# Patient Record
Sex: Female | Born: 1935 | Race: Black or African American | Hispanic: No | Smoking: Former smoker
Health system: Southern US, Community
[De-identification: ages and names within clinical notes are randomized; demographics above are authoritative.]

## PROBLEM LIST (undated history)

## (undated) DIAGNOSIS — M109 Gout, unspecified: Secondary | ICD-10-CM

## (undated) DIAGNOSIS — K8020 Calculus of gallbladder without cholecystitis without obstruction: Secondary | ICD-10-CM

## (undated) DIAGNOSIS — I2510 Atherosclerotic heart disease of native coronary artery without angina pectoris: Secondary | ICD-10-CM

## (undated) DIAGNOSIS — Z87898 Personal history of other specified conditions: Secondary | ICD-10-CM

## (undated) DIAGNOSIS — I5032 Chronic diastolic (congestive) heart failure: Secondary | ICD-10-CM

## (undated) DIAGNOSIS — N183 Chronic kidney disease, stage 3 unspecified: Secondary | ICD-10-CM

## (undated) DIAGNOSIS — K2970 Gastritis, unspecified, without bleeding: Secondary | ICD-10-CM

## (undated) DIAGNOSIS — Z940 Kidney transplant status: Secondary | ICD-10-CM

## (undated) DIAGNOSIS — J189 Pneumonia, unspecified organism: Secondary | ICD-10-CM

## (undated) DIAGNOSIS — E119 Type 2 diabetes mellitus without complications: Secondary | ICD-10-CM

## (undated) DIAGNOSIS — B3781 Candidal esophagitis: Secondary | ICD-10-CM

## (undated) DIAGNOSIS — I119 Hypertensive heart disease without heart failure: Secondary | ICD-10-CM

## (undated) DIAGNOSIS — R0600 Dyspnea, unspecified: Secondary | ICD-10-CM

## (undated) HISTORY — PX: ABDOMINAL HYSTERECTOMY: SHX81

## (undated) HISTORY — DX: Calculus of gallbladder without cholecystitis without obstruction: K80.20

## (undated) HISTORY — DX: Gout, unspecified: M10.9

---

## 1997-11-26 ENCOUNTER — Other Ambulatory Visit: Admission: RE | Admit: 1997-11-26 | Discharge: 1997-11-26 | Payer: Self-pay | Admitting: *Deleted

## 1997-12-03 ENCOUNTER — Other Ambulatory Visit: Admission: RE | Admit: 1997-12-03 | Discharge: 1997-12-03 | Payer: Self-pay | Admitting: Nephrology

## 1997-12-10 ENCOUNTER — Other Ambulatory Visit: Admission: RE | Admit: 1997-12-10 | Discharge: 1997-12-10 | Payer: Self-pay | Admitting: Nephrology

## 1997-12-21 ENCOUNTER — Ambulatory Visit: Admission: RE | Admit: 1997-12-21 | Discharge: 1997-12-21 | Payer: Self-pay | Admitting: Physician Assistant

## 1997-12-21 ENCOUNTER — Other Ambulatory Visit: Admission: RE | Admit: 1997-12-21 | Discharge: 1997-12-21 | Payer: Self-pay | Admitting: Nephrology

## 1998-01-16 ENCOUNTER — Ambulatory Visit (HOSPITAL_COMMUNITY): Admission: RE | Admit: 1998-01-16 | Discharge: 1998-01-16 | Payer: Self-pay | Admitting: Vascular Surgery

## 1998-01-16 ENCOUNTER — Other Ambulatory Visit: Admission: RE | Admit: 1998-01-16 | Discharge: 1998-01-16 | Payer: Self-pay | Admitting: *Deleted

## 1998-02-06 ENCOUNTER — Other Ambulatory Visit: Admission: RE | Admit: 1998-02-06 | Discharge: 1998-02-06 | Payer: Self-pay | Admitting: *Deleted

## 1998-02-14 ENCOUNTER — Ambulatory Visit: Admission: RE | Admit: 1998-02-14 | Discharge: 1998-02-14 | Payer: Self-pay | Admitting: Vascular Surgery

## 1998-02-15 ENCOUNTER — Other Ambulatory Visit: Admission: RE | Admit: 1998-02-15 | Discharge: 1998-02-15 | Payer: Self-pay | Admitting: Nephrology

## 1998-03-19 ENCOUNTER — Ambulatory Visit (HOSPITAL_COMMUNITY): Admission: RE | Admit: 1998-03-19 | Discharge: 1998-03-19 | Payer: Self-pay | Admitting: Vascular Surgery

## 1998-04-10 ENCOUNTER — Ambulatory Visit (HOSPITAL_COMMUNITY): Admission: RE | Admit: 1998-04-10 | Discharge: 1998-04-10 | Payer: Self-pay | Admitting: Vascular Surgery

## 1998-04-13 ENCOUNTER — Ambulatory Visit (HOSPITAL_COMMUNITY): Admission: EM | Admit: 1998-04-13 | Discharge: 1998-04-13 | Payer: Self-pay | Admitting: Emergency Medicine

## 1998-04-29 ENCOUNTER — Ambulatory Visit (HOSPITAL_COMMUNITY): Admission: RE | Admit: 1998-04-29 | Discharge: 1998-04-29 | Payer: Self-pay | Admitting: *Deleted

## 1998-05-12 ENCOUNTER — Ambulatory Visit (HOSPITAL_COMMUNITY): Admission: RE | Admit: 1998-05-12 | Discharge: 1998-05-12 | Payer: Self-pay | Admitting: Thoracic Surgery

## 1998-05-22 ENCOUNTER — Encounter (HOSPITAL_COMMUNITY): Admission: RE | Admit: 1998-05-22 | Discharge: 1998-08-20 | Payer: Self-pay | Admitting: Nephrology

## 1998-05-27 ENCOUNTER — Ambulatory Visit (HOSPITAL_COMMUNITY): Admission: RE | Admit: 1998-05-27 | Discharge: 1998-05-27 | Payer: Self-pay | Admitting: *Deleted

## 1998-07-24 ENCOUNTER — Emergency Department (HOSPITAL_COMMUNITY): Admission: EM | Admit: 1998-07-24 | Discharge: 1998-07-24 | Payer: Self-pay | Admitting: Emergency Medicine

## 1998-07-27 ENCOUNTER — Emergency Department (HOSPITAL_COMMUNITY): Admission: EM | Admit: 1998-07-27 | Discharge: 1998-07-27 | Payer: Self-pay | Admitting: Emergency Medicine

## 1998-07-27 ENCOUNTER — Encounter: Payer: Self-pay | Admitting: Emergency Medicine

## 1998-08-25 ENCOUNTER — Ambulatory Visit (HOSPITAL_COMMUNITY): Admission: RE | Admit: 1998-08-25 | Discharge: 1998-08-25 | Payer: Self-pay | Admitting: Vascular Surgery

## 1998-08-28 ENCOUNTER — Ambulatory Visit: Admission: RE | Admit: 1998-08-28 | Discharge: 1998-08-28 | Payer: Self-pay | Admitting: Vascular Surgery

## 1998-09-10 ENCOUNTER — Ambulatory Visit (HOSPITAL_COMMUNITY): Admission: RE | Admit: 1998-09-10 | Discharge: 1998-09-10 | Payer: Self-pay | Admitting: Vascular Surgery

## 1998-09-10 ENCOUNTER — Encounter: Payer: Self-pay | Admitting: Vascular Surgery

## 1998-09-18 ENCOUNTER — Encounter: Payer: Self-pay | Admitting: Nephrology

## 1998-09-18 ENCOUNTER — Ambulatory Visit (HOSPITAL_COMMUNITY): Admission: RE | Admit: 1998-09-18 | Discharge: 1998-09-18 | Payer: Self-pay | Admitting: Nephrology

## 1998-09-22 ENCOUNTER — Ambulatory Visit (HOSPITAL_COMMUNITY): Admission: RE | Admit: 1998-09-22 | Discharge: 1998-09-22 | Payer: Self-pay | Admitting: Vascular Surgery

## 1998-10-17 ENCOUNTER — Ambulatory Visit (HOSPITAL_COMMUNITY): Admission: RE | Admit: 1998-10-17 | Discharge: 1998-10-17 | Payer: Self-pay | Admitting: *Deleted

## 1998-10-20 ENCOUNTER — Ambulatory Visit (HOSPITAL_COMMUNITY): Admission: RE | Admit: 1998-10-20 | Discharge: 1998-10-20 | Payer: Self-pay | Admitting: Vascular Surgery

## 1998-11-03 ENCOUNTER — Ambulatory Visit (HOSPITAL_COMMUNITY): Admission: RE | Admit: 1998-11-03 | Discharge: 1998-11-03 | Payer: Self-pay | Admitting: Nephrology

## 1998-11-04 ENCOUNTER — Encounter: Payer: Self-pay | Admitting: Nephrology

## 1998-11-04 ENCOUNTER — Ambulatory Visit (HOSPITAL_COMMUNITY): Admission: RE | Admit: 1998-11-04 | Discharge: 1998-11-04 | Payer: Self-pay | Admitting: Nephrology

## 1998-11-17 ENCOUNTER — Encounter: Payer: Self-pay | Admitting: Vascular Surgery

## 1998-11-17 ENCOUNTER — Ambulatory Visit (HOSPITAL_COMMUNITY): Admission: EM | Admit: 1998-11-17 | Discharge: 1998-11-17 | Payer: Self-pay | Admitting: Internal Medicine

## 1999-10-21 ENCOUNTER — Ambulatory Visit (HOSPITAL_COMMUNITY): Admission: RE | Admit: 1999-10-21 | Discharge: 1999-10-21 | Payer: Self-pay | Admitting: Nephrology

## 1999-11-04 ENCOUNTER — Encounter (HOSPITAL_COMMUNITY): Admission: RE | Admit: 1999-11-04 | Discharge: 2000-02-02 | Payer: Self-pay | Admitting: Nephrology

## 2000-01-20 ENCOUNTER — Inpatient Hospital Stay (HOSPITAL_COMMUNITY): Admission: AD | Admit: 2000-01-20 | Discharge: 2000-02-01 | Payer: Self-pay | Admitting: Nephrology

## 2000-01-22 ENCOUNTER — Encounter: Payer: Self-pay | Admitting: Nephrology

## 2000-01-26 ENCOUNTER — Encounter: Payer: Self-pay | Admitting: Nephrology

## 2000-01-26 ENCOUNTER — Encounter: Payer: Self-pay | Admitting: Thoracic Surgery

## 2000-02-05 ENCOUNTER — Encounter (HOSPITAL_COMMUNITY): Admission: RE | Admit: 2000-02-05 | Discharge: 2000-05-05 | Payer: Self-pay | Admitting: Nephrology

## 2000-03-15 ENCOUNTER — Ambulatory Visit (HOSPITAL_COMMUNITY): Admission: RE | Admit: 2000-03-15 | Discharge: 2000-03-15 | Payer: Self-pay | Admitting: Nephrology

## 2000-03-15 ENCOUNTER — Encounter: Payer: Self-pay | Admitting: Nephrology

## 2000-07-29 ENCOUNTER — Ambulatory Visit (HOSPITAL_COMMUNITY): Admission: RE | Admit: 2000-07-29 | Discharge: 2000-07-29 | Payer: Self-pay | Admitting: Nephrology

## 2000-09-15 ENCOUNTER — Encounter (HOSPITAL_COMMUNITY): Admission: RE | Admit: 2000-09-15 | Discharge: 2000-12-14 | Payer: Self-pay | Admitting: Nephrology

## 2000-09-29 ENCOUNTER — Encounter: Payer: Self-pay | Admitting: Nephrology

## 2000-09-29 ENCOUNTER — Ambulatory Visit (HOSPITAL_COMMUNITY): Admission: RE | Admit: 2000-09-29 | Discharge: 2000-09-29 | Payer: Self-pay | Admitting: Nephrology

## 2000-11-15 ENCOUNTER — Ambulatory Visit (HOSPITAL_COMMUNITY): Admission: RE | Admit: 2000-11-15 | Discharge: 2000-11-15 | Payer: Self-pay | Admitting: Nephrology

## 2000-11-15 ENCOUNTER — Encounter: Payer: Self-pay | Admitting: Nephrology

## 2000-12-07 ENCOUNTER — Encounter: Payer: Self-pay | Admitting: *Deleted

## 2000-12-08 ENCOUNTER — Encounter: Payer: Self-pay | Admitting: *Deleted

## 2000-12-09 ENCOUNTER — Inpatient Hospital Stay (HOSPITAL_COMMUNITY): Admission: RE | Admit: 2000-12-09 | Discharge: 2000-12-10 | Payer: Self-pay | Admitting: *Deleted

## 2000-12-09 ENCOUNTER — Encounter: Payer: Self-pay | Admitting: *Deleted

## 2000-12-12 ENCOUNTER — Emergency Department (HOSPITAL_COMMUNITY): Admission: EM | Admit: 2000-12-12 | Discharge: 2000-12-13 | Payer: Self-pay | Admitting: Physician Assistant

## 2000-12-14 ENCOUNTER — Inpatient Hospital Stay (HOSPITAL_COMMUNITY): Admission: AD | Admit: 2000-12-14 | Discharge: 2000-12-17 | Payer: Self-pay | Admitting: Nephrology

## 2000-12-14 ENCOUNTER — Encounter: Payer: Self-pay | Admitting: Nephrology

## 2000-12-20 ENCOUNTER — Ambulatory Visit (HOSPITAL_COMMUNITY): Admission: RE | Admit: 2000-12-20 | Discharge: 2000-12-20 | Payer: Self-pay | Admitting: Nephrology

## 2000-12-23 ENCOUNTER — Inpatient Hospital Stay (HOSPITAL_COMMUNITY): Admission: EM | Admit: 2000-12-23 | Discharge: 2000-12-28 | Payer: Self-pay | Admitting: Emergency Medicine

## 2000-12-23 ENCOUNTER — Encounter: Payer: Self-pay | Admitting: Nephrology

## 2000-12-27 ENCOUNTER — Encounter: Payer: Self-pay | Admitting: Nephrology

## 2001-01-19 ENCOUNTER — Ambulatory Visit (HOSPITAL_COMMUNITY): Admission: RE | Admit: 2001-01-19 | Discharge: 2001-01-19 | Payer: Self-pay | Admitting: Nephrology

## 2001-01-19 ENCOUNTER — Encounter: Payer: Self-pay | Admitting: Nephrology

## 2001-02-16 ENCOUNTER — Encounter: Payer: Self-pay | Admitting: *Deleted

## 2001-02-16 ENCOUNTER — Inpatient Hospital Stay (HOSPITAL_COMMUNITY): Admission: RE | Admit: 2001-02-16 | Discharge: 2001-02-24 | Payer: Self-pay | Admitting: *Deleted

## 2001-03-16 ENCOUNTER — Encounter (HOSPITAL_COMMUNITY): Admission: RE | Admit: 2001-03-16 | Discharge: 2001-06-14 | Payer: Self-pay | Admitting: Nephrology

## 2001-04-01 ENCOUNTER — Encounter: Payer: Self-pay | Admitting: Emergency Medicine

## 2001-04-01 ENCOUNTER — Emergency Department (HOSPITAL_COMMUNITY): Admission: EM | Admit: 2001-04-01 | Discharge: 2001-04-01 | Payer: Self-pay | Admitting: Emergency Medicine

## 2001-04-19 ENCOUNTER — Encounter: Payer: Self-pay | Admitting: Nephrology

## 2001-04-19 ENCOUNTER — Inpatient Hospital Stay (HOSPITAL_COMMUNITY): Admission: EM | Admit: 2001-04-19 | Discharge: 2001-04-24 | Payer: Self-pay | Admitting: Emergency Medicine

## 2001-04-19 ENCOUNTER — Encounter (INDEPENDENT_AMBULATORY_CARE_PROVIDER_SITE_OTHER): Payer: Self-pay | Admitting: Specialist

## 2001-07-28 ENCOUNTER — Ambulatory Visit (HOSPITAL_COMMUNITY): Admission: RE | Admit: 2001-07-28 | Discharge: 2001-07-28 | Payer: Self-pay | Admitting: Nephrology

## 2001-07-29 ENCOUNTER — Inpatient Hospital Stay (HOSPITAL_COMMUNITY): Admission: AD | Admit: 2001-07-29 | Discharge: 2001-07-30 | Payer: Self-pay | Admitting: *Deleted

## 2001-07-29 ENCOUNTER — Encounter: Payer: Self-pay | Admitting: *Deleted

## 2001-08-02 HISTORY — PX: KIDNEY TRANSPLANT: SHX239

## 2001-08-28 ENCOUNTER — Encounter: Payer: Self-pay | Admitting: *Deleted

## 2001-08-28 ENCOUNTER — Ambulatory Visit (HOSPITAL_COMMUNITY): Admission: RE | Admit: 2001-08-28 | Discharge: 2001-08-28 | Payer: Self-pay | Admitting: *Deleted

## 2001-08-31 ENCOUNTER — Encounter: Payer: Self-pay | Admitting: Vascular Surgery

## 2001-08-31 ENCOUNTER — Ambulatory Visit (HOSPITAL_COMMUNITY): Admission: RE | Admit: 2001-08-31 | Discharge: 2001-08-31 | Payer: Self-pay | Admitting: Vascular Surgery

## 2001-11-03 ENCOUNTER — Encounter: Payer: Self-pay | Admitting: Vascular Surgery

## 2001-11-03 ENCOUNTER — Inpatient Hospital Stay (HOSPITAL_COMMUNITY): Admission: RE | Admit: 2001-11-03 | Discharge: 2001-11-04 | Payer: Self-pay | Admitting: Vascular Surgery

## 2002-02-08 ENCOUNTER — Ambulatory Visit: Admission: RE | Admit: 2002-02-08 | Discharge: 2002-02-08 | Payer: Self-pay | Admitting: *Deleted

## 2002-02-10 ENCOUNTER — Encounter (INDEPENDENT_AMBULATORY_CARE_PROVIDER_SITE_OTHER): Payer: Self-pay | Admitting: *Deleted

## 2002-02-10 ENCOUNTER — Inpatient Hospital Stay (HOSPITAL_COMMUNITY): Admission: EM | Admit: 2002-02-10 | Discharge: 2002-02-14 | Payer: Self-pay | Admitting: Emergency Medicine

## 2002-04-07 ENCOUNTER — Emergency Department (HOSPITAL_COMMUNITY): Admission: EM | Admit: 2002-04-07 | Discharge: 2002-04-07 | Payer: Self-pay | Admitting: *Deleted

## 2002-04-07 ENCOUNTER — Encounter: Payer: Self-pay | Admitting: Emergency Medicine

## 2002-04-10 ENCOUNTER — Encounter: Payer: Self-pay | Admitting: Nephrology

## 2002-04-10 ENCOUNTER — Encounter: Admission: RE | Admit: 2002-04-10 | Discharge: 2002-04-10 | Payer: Self-pay | Admitting: Nephrology

## 2002-04-10 ENCOUNTER — Inpatient Hospital Stay (HOSPITAL_COMMUNITY): Admission: EM | Admit: 2002-04-10 | Discharge: 2002-04-18 | Payer: Self-pay | Admitting: Emergency Medicine

## 2002-04-13 ENCOUNTER — Encounter: Payer: Self-pay | Admitting: Vascular Surgery

## 2002-10-05 ENCOUNTER — Encounter: Payer: Self-pay | Admitting: Nephrology

## 2002-10-05 ENCOUNTER — Encounter: Admission: RE | Admit: 2002-10-05 | Discharge: 2002-10-05 | Payer: Self-pay | Admitting: Nephrology

## 2002-10-24 ENCOUNTER — Encounter: Admission: RE | Admit: 2002-10-24 | Discharge: 2002-10-24 | Payer: Self-pay | Admitting: Dentistry

## 2002-10-26 ENCOUNTER — Encounter (HOSPITAL_COMMUNITY): Payer: Self-pay | Admitting: Dentistry

## 2002-10-26 ENCOUNTER — Ambulatory Visit (HOSPITAL_COMMUNITY): Admission: RE | Admit: 2002-10-26 | Discharge: 2002-10-26 | Payer: Self-pay | Admitting: Dentistry

## 2003-07-03 ENCOUNTER — Ambulatory Visit (HOSPITAL_COMMUNITY): Admission: RE | Admit: 2003-07-03 | Discharge: 2003-07-03 | Payer: Self-pay | Admitting: Orthopedic Surgery

## 2003-07-03 ENCOUNTER — Ambulatory Visit (HOSPITAL_BASED_OUTPATIENT_CLINIC_OR_DEPARTMENT_OTHER): Admission: RE | Admit: 2003-07-03 | Discharge: 2003-07-03 | Payer: Self-pay | Admitting: Orthopedic Surgery

## 2003-07-03 ENCOUNTER — Encounter (INDEPENDENT_AMBULATORY_CARE_PROVIDER_SITE_OTHER): Payer: Self-pay | Admitting: *Deleted

## 2004-08-09 ENCOUNTER — Emergency Department (HOSPITAL_COMMUNITY): Admission: EM | Admit: 2004-08-09 | Discharge: 2004-08-10 | Payer: Self-pay | Admitting: Emergency Medicine

## 2004-08-11 ENCOUNTER — Ambulatory Visit: Payer: Self-pay | Admitting: Infectious Diseases

## 2004-08-11 ENCOUNTER — Inpatient Hospital Stay (HOSPITAL_COMMUNITY): Admission: AD | Admit: 2004-08-11 | Discharge: 2004-08-16 | Payer: Self-pay | Admitting: Nephrology

## 2004-08-12 ENCOUNTER — Encounter (INDEPENDENT_AMBULATORY_CARE_PROVIDER_SITE_OTHER): Payer: Self-pay | Admitting: Specialist

## 2004-08-13 ENCOUNTER — Encounter (INDEPENDENT_AMBULATORY_CARE_PROVIDER_SITE_OTHER): Payer: Self-pay | Admitting: *Deleted

## 2005-09-08 ENCOUNTER — Emergency Department (HOSPITAL_COMMUNITY): Admission: EM | Admit: 2005-09-08 | Discharge: 2005-09-08 | Payer: Self-pay | Admitting: Emergency Medicine

## 2006-01-15 ENCOUNTER — Emergency Department (HOSPITAL_COMMUNITY): Admission: EM | Admit: 2006-01-15 | Discharge: 2006-01-15 | Payer: Self-pay | Admitting: Emergency Medicine

## 2006-07-09 ENCOUNTER — Emergency Department (HOSPITAL_COMMUNITY): Admission: EM | Admit: 2006-07-09 | Discharge: 2006-07-09 | Payer: Self-pay | Admitting: Emergency Medicine

## 2006-07-18 ENCOUNTER — Emergency Department (HOSPITAL_COMMUNITY): Admission: EM | Admit: 2006-07-18 | Discharge: 2006-07-18 | Payer: Self-pay | Admitting: Emergency Medicine

## 2007-10-17 ENCOUNTER — Encounter: Admission: RE | Admit: 2007-10-17 | Discharge: 2007-10-17 | Payer: Self-pay | Admitting: Nephrology

## 2008-06-20 ENCOUNTER — Ambulatory Visit: Payer: Self-pay | Admitting: Dentistry

## 2010-08-22 ENCOUNTER — Encounter: Payer: Self-pay | Admitting: Nephrology

## 2010-12-18 NOTE — Op Note (Signed)
[place]. [facility]  Patient:    [name], [place] W.                        MRN: [id number] Proc. Date: 12/08/00 Attending:  Denman George, [name]                           Operative Report  PREOPERATIVE DIAGNOSIS:  End-stage renal failure.  POSTOPERATIVE DIAGNOSIS:  End-stage renal failure.  PROCEDURES: 1. Ultrasound localization bilateral internal jugular veins. 2. Attempted bilateral internal jugular vein Ash catheter insertion.  SURGEON:  Denman George, [name]  ASSISTANT:  Nurse.  ANESTHESIA:  Local with MAC.  CLINICAL NOTE:  This is a 77 year old female with a history of multiple previous failed vascular access procedures for hemodialysis.  She recently had an infected right internal jugular Ash catheter removed.  Brought back to the operating room at this time for further access placement with dialysis catheter.  DESCRIPTION OF PROCEDURE:  Prior to prepping, ultrasound localization of the internal jugular vein was carried out bilaterally.  The vein appeared to be small on the left, larger on the right.  There was some thickening of the vein noted bilaterally.  The neck and chest were then prepped and draped sterilely.  Skin and subcutaneous tissue at the base of the right neck was instilled with 1% Xylocaine.  A needle was introduced in the right internal jugular vein. Several attempts made to pass a guidewire; however, these were unsuccessful. There appeared to be an occlusion or stenosis proximally in the vein.  The needle and guidewire were removed.  Several attempts were then made to access the left internal jugular vein. These, however, were unsuccessful.  The procedure was discontinued at this time.  Concern for a possible mediastinal hematoma by fluoroscopy was evident.  Patient transferred to the PACU stable and chest x-ray ordered. DD:  12/09/00 TD:  12/10/00 Job: [id number]

## 2010-12-18 NOTE — Op Note (Signed)
[place]. [facility]  Patient:    [name], [name] Visit Number: [id number] MRN: [id number]          Service Type: SUR Location: [phone] Attending Physician:  Colvin Caroli Dictated by:   Quita Skye Hart [place], [name] Proc. 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  POSTOPERATIVE DIAGNOSIS:  Expanding organized lymphocele surrounding right thigh AV Gore-Tex graft at arterial and venous anastomoses.  OPERATION: 1. Excision of organized lymphocele surrounding AV Gore-Tex graft right    thigh. 2. Thrombectomy and revision AV Gore-Tex graft right thigh.  SURGEON:  Quita Skye. Hart [place], [name]  ASSISTANT:  Nurse.  ANESTHESIA:  General endotracheal anesthesia.  DESCRIPTION OF PROCEDURE:  The patient was taken to the operating room and placed in the supine position at which time satisfactory general endotracheal anesthesia was administered. The right lower quadrant and thigh were prepped with Betadine scrubbing solution, draped in routine sterile manner. There was a large mass overlying the arterial and venous anastomosis measuring approximately 6 cm in diameter.  It was not pulsatile, slightly expansile. The graft was functioning. An oblique incision was made through the previous scar, carried down to this organized mass which appeared to be an organized lymphocele.  Control of the arterial end was obtained where it exited the mass and care was taken to develop the skin flaps surrounding this as thick as possible, being right down on the organized lymphocele. When this was almost completely circumferentially dissected free, it was adherent to the arterial and venous anastomoses quite densely.  Dissection then continued up along the graft. The graft was  actually in the center portion of the lymphocele once it was entered.  Clear fluid was present and this was cultured.  The graft had been previously anastomosed end-to-end in the center of this lymphocele and when applying traction to it while performing the procedure, this anastomosis disrupted.  Therefore, the patient was given 3000 units of heparin and the old anastomosis graft was excised.  There was some thrombus which had formed in the graft and Fogarty thrombectomy was performed of the arterial and venous end.  There was excellent in-flow and good back bleeding. The graft was reanastomosed end-to-end with 6-0 Prolene.  Clamps released and there was good pulse and thrill.  As much of the lymphocele as possible was excised, probably 95% of it, but there was some still remaining up around the arterial and venous anastomoses which was densely adherent to the vessels.  Adequate hemostasis was achieved.  A 10 Jackson-Pratt drain was brought out through an inferiorly based stab wound, secured with nylon suture and the wound was closed with interrupted 3-0 Vicryl and interrupted 3-0 nylon.  Sterile dressing applied.  The patient was taken to the recovery room in satisfactory condition. Dictated by:   Quita Skye Hart [place], [name] Attending Physician:  Colvin Caroli DD:  02/10/02 TD:  02/13/02 Job: [id number]

## 2010-12-18 NOTE — Discharge Summary (Signed)
[place]. [facility]  Patient:    [name]                          MRN: [id number] Adm. Date:  [id number] Disc. Date: 02/01/00 Attending:  Dayle Points Dictator:   Wolfgang Phoenix, P.A. CC:         [facility], [facility]                           Discharge Summary  DISCHARGE DIAGNOSES: 1. Catheter-related sepsis, blood cultures one of two positive for yeast.    Catheter tip positive for Alcaligenes xylosoxidans. 2. Right ileojejunal Ash catheter placement by Dr. Edwyna Shell on January 26, 2000. 3. Hypertension. 4. Hyperkalemia, resolved at discharge.  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  Blood cultures obtained during those treatments revealed fungemia.  One of two blood cultures were positive for yeast.  Catheter in place at the time of admission had been placed in April 2000, and she refused any further graft placement secondary to previous complications with multiple thrombectomies.  On admission, she had a fever of 101.3 with blood pressure 160/83, and three-day history of mild headaches.  She denied any nausea or vomiting.  She was started on IV Diflucan and admitted for further observation and catheter removal.  HOSPITAL COURSE:  [name] was immediately started on Diflucan 400 mg IV q.d., and her catheter was removed on January 20, 2000.  There were no acute complications from this procedure.  On the day following removal of her catheter, she continued to run fevers from 99 to 100 degrees.  The importance of extremity access was again discussed; however, [name] continued to refuse ______ or AVF  placement.  Catheter tips from the old catheter were sent for culture at the time of removal.  Her labs were monitored as well as her fever.  Over the next few days, [name] continued to be euvolemic; however, her potassium began to rise, and finally on June 23, she required p.o. Kayexalate for hyperkalemia.  We were reluctant to place a permanent catheter at this time because she was continuing to spike fevers.  On hospital day #5, she was afebrile with a T-max over the last 24 hours of 99.0 degrees.  She was feeling much better, and it was decided that she could be cleared for catheter placement.  On January 26, 2000, a right IJ Ash catheter was placed by Dr. Edwyna Shell without acute complications.  She was dialyzed following the catheter placement; however, following hemodialysis, she spiked a temperature to 100.8 degrees.  Upon further review, her catheter tips were growing Alcaligenes xylosoxidans, and she was initiated on ciprofloxacin as well as vancomycin.  She had a mild reaction (itching) to vancomycin, and this was discontinued.  No identification of the yeast had been made at that point.  [facility] was notified at that time of the need for sensitivities, and she was changed from Diflucan to amphotericin B until an organism identification could be made.  Follow the initiation of amphotericin B and ciprofloxacin, she defervesced. On hospital day #9, she was afebrile with  temperature of 97.6.  She continued to dialyze via her new catheter without further problems and remained afebrile throughout the remainder of her hospitalization.  On February 01, 2000, she was evaluated by Dr. Camille Bal who felt that she was stable for discharge. It was felt that she should remain on amphotericin B until positive identification of the yeast could be made.  Arrangements were made with her outpatient hemodialysis center to delivery the amphotericin B.  In addition, she will continue on ciprofloxacin  for an additional two weeks for the Alcaligenes xylosoxidans.  DISCHARGE MEDICATIONS: 1. Nephro-Vite 1 p.o. q.d. 2. Calcium 500 mg 1 tablet t.i.d. with meals. 3. Ciprofloxacin 500 mg 1 p.o. q.d. x an additional 8 days. 4. Amphotericin B 35 mg IV each hemodialysis x 10 treatments. 5. Norvasc 10 mg 1 p.o. on non-hemodialysis days, 5 mg on hemodialysis days. 6. Epogen 4000 units IV each hemodialysis.  DISCHARGE INSTRUCTIONS:  [name] is to follow a 70 g protein, 2 g sodium, 2 g potassium, renal failure diet with a 5 cup fluid restriction per day.  Her estimated dry weight at time of discharge is 72.5 kg.  She should follow up her hemodialysis at the [facility] and dialyze via her right IJ Ash catheter.  DISPOSITION:  Discharged to home.  CONDITION:  Stable. DD:  03/07/00 TD:  03/08/00 Job: [id number]

## 2010-12-18 NOTE — H&P (Signed)
[place]. [facility]  Patient:    [name], [name] Visit Number: [id number] MRN: [id number]          Service Type: DSU Location: 5500 [phone] Attending Physician:  Melvenia Needles Dictated by:   Sherrie George, P.A. Admit Date:  12/08/2000   CC:         Denman George, [name] (office)                         History and Physical  DATE OF BIRTH: 05-09-1932  CHIEF COMPLAINT: The patient is a 77 year old black female with end-stage renal disease, on hemodialysis.  She was recently hospitalized in July 2001 for catheter sepsis, and blood cultures (one of two) was positive for yeast, catheter tip positive for Alcaligenes xylosoxidans.  HISTORY OF PRESENT ILLNESS: She was treated medically and had a right ileojejunal Ash catheter placed on January 26, 2000 by Dr. Edwyna Shell.  She presented again earlier this week in the office and had her current Ash catheter removed secondary to reported recurrent sepsis.  I have that documentation of that. She was then sent home, and she is admitted today for outpatient placement of a new Ash catheter.  Dr. Madilyn Fireman attempted this but was unable to get a catheter in.  He plans to admit her for observation overnight and attempt to place a new catheter again tomorrow.  ALLERGIES: The patient had apparent allergic reaction after she was started on ZINACEF intraoperatively.  She developed significant itching all over her body, along with swelling of her eyes and lips.  She was treated with Benadryl and Solu-Cortef and at the time I saw her in the recovery room her itching had pretty much resolved and she had no further swelling of her eyes or lips.  PAST MEDICAL HISTORY:  1. She has end-stage renal disease and has been on hemodialysis for three     years.  She does Monday, Wednesday, and Friday to the [facility]     dialysis unit.  2. She has a history of hypertension, which was reported to be a cause of her     end-stage  renal disease.  3. She had a hysterectomy many years ago at age 65.  SOCIAL HISTORY: She smoked less than a packs of cigarettes every two weeks for three years, and quit over three years ago.  ETOH, none.  FAMILY HISTORY: Noncontributory.  REVIEW OF SYSTEMS: CV: Negative.  No history of stroke, weakness of extremities, trouble with vision or speech difficulties.  PULMONARY: She denies any history of shortness of breath.  She has cough in the morning.  No PND, orthopnea, or night sweats.  CARDIAC: No history of angina, no history of myocardial infarction.  GI: She tolerates a renal diet without difficulty, with no history of diarrhea, constipation, GI bleed, or history of liver problems. GU: She has no complaints.  She does make a little bit of urine early in the a.m. when she first gets up.  EXTREMITIES: Negative.  PHYSICAL EXAMINATION:  GENERAL: The patient is a well-developed, well-nourished black female in no acute distress.  VITAL SIGNS: Temperature 97.4 degrees, pulse 85, respirations 18 and unlabored, blood pressure 160/86.  HEENT: Head normocephalic.  PERRLA.  EOMI.  Fundi not visualized.  Ears, nose, throat, and mouth grossly within normal limits.  NECK: No bruits, no lymphadenopathy, no thyromegaly.  CHEST: Clear to auscultation and percussion bilaterally.  She has sutures in the  right upper chest from her last Ash catheter - this has been removed.  BREAST: Not examined, not indicated.  HEART: No murmurs, rubs, or gallops.  Pulses +1-+2 and equal bilaterally.  ABDOMEN: Soft, nontender, posterior bowel sounds; no palpable organomegaly.  EXTREMITIES: No clubbing, cyanosis, or edema.  SKIN: No rash, no further itching.  IMPRESSION:  1. End-stage renal disease with a yeast/fungal bacteremia.  2. Hypertension.  PLAN: The patient is admitted overnight for 24 hour evaluation, with plans to attempt to place a new Ash catheter in the a.m., Dec 09, 2000. Dictated by:    Sherrie George, P.A. Attending Physician:  Melvenia Needles DD:  12/08/00 TD:  12/08/00 Job: [id number]

## 2010-12-18 NOTE — H&P (Signed)
[place]. [facility]  Patient:    [name], [name]                           MRN: [id number] Adm. Date:  02/16/01 Attending:  _____________________ Dictator:   Durenda Age, P.A.-C.                         History and Physical  DATE OF BIRTH:  12-03-1931  CHIEF COMPLAINT:  ESRD.  HISTORY OF PRESENT ILLNESS:  [age]-year-old black female with known history of ESRD, on hemodialysis, with multiple previous accesses.  The patient was referred by Dr. Aram Beecham B. Eliott Nine for evaluation of A-V Gore-Tex graft in the lower extremities.  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  The patient has been scheduled for right thigh A-V Gore-Tex graft as the best possibility for access on February 16, 2001.  In  the right thigh, the patient has no erythema, edema, purulent drainage, odor or tenderness to palpation.  No fever, chills, mental status changes, shortness of breath, dyspnea on exertion, angina or palpitations.  PAST MEDICAL HISTORY:  ESRD, on hemodialysis on Monday, Wednesday and Friday at [facility].  Hypertension.  Status post catheter sepsis positive for Alcaligenes in May 2002, recurrent from a previous sepsis of the same kind in July 2001.  PAST SURGICAL HISTORY:  Status post hysterectomy, remote.  Status post IJ Ash catheter in June 2001, Dr. Norton Blizzard.  Status post attempted IJ Ash cath in May 2002 by Dr. Denman George, unsuccessful, followed up by status post insertion of a left subclavian Quinton cath.  Status post venogram in June 2002.  MEDICATIONS:  The patient "forgot" to bring the medications.  ALLERGIES:  ZINACEF -- angioedema.  REVIEW OF SYSTEMS:  See HPI and past medical history for significant positives.  Mother died at 63 of kidney disease.  Father died at 60 of MI. One sister alive with a  history of diabetes.  One sister alive with a history of CVA.  One brother alive with a history of diabetes and heart disease and one brother with heart disease.  SOCIAL HISTORY:  Married.  Five children, one on hemodialysis.  She is a retired Administrator, arts delivery person.  She denies any alcohol intake.  She quit tobacco in 1998.  PHYSICAL EXAMINATION:  GENERAL:  Well-developed, well-nourished 77 year old black female in no acute distress, alert and oriented x 3.  VITAL SIGNS:  Blood pressure 122/78, pulse 90, respirations 18.  HEENT:  Normocephalic, atraumatic.  PERRLA.  EOMI.  No cataracts, glaucoma or macular degeneration.  NECK:  Supple.  No JVD, bruits or lymphadenopathy.  CHEST:  Symmetrical on inspiration.  Lungs clear to auscultation.  CARDIOVASCULAR:  Regular rate and rhythm, without murmurs, rubs, or gallops.  ABDOMEN:  Soft, nontender.  Bowel sounds x 4.  No masses or bruits.  GU:  Deferred.  RECTAL:  Deferred.  EXTREMITIES:  No clubbing or cyanosis.  There is a trace of edema in both lower extremities.  No ulcerations.  Warm temperature.  PERIPHERAL PULSES:  Carotids, femorals, popliteals, dorsalis pedis and posterior tibialis 2+ bilaterally.  NEUROLOGIC:  Gait steady.  DTRs 2+ bilaterally.  Muscle strength 5/5.  ASSESSMENT AND PLAN:  End-stage renal disease, for insertion of right thigh arteriovenous Gore-Tex graft at [facility] on February 16, 2001, to be assigned by which physician will perform the procedure. DD:  02/13/01 TD:  02/13/01 Job: [id number]

## 2010-12-18 NOTE — Discharge Summary (Signed)
[place]. [facility]  Patient:    [name], [name] Visit Number: [id number] MRN: [id number]          Service Type: DSU Location: 5500 [phone] Attending Physician:  Alyson Locket Dictated by:   Gwenyth Bender, C.R.N.A. Admit Date:  11/03/2001 Discharge Date: 11/04/2001   CC:         [facility]   Discharge Summary  DATE OF BIRTH:  10-30-1931  ADMISSION DIAGNOSIS:  Lymphocele right thigh.  PAST MEDICAL HISTORY:  End-stage renal disease currently on hemodialysis Monday/Wednesday/Friday schedule at [facility] location.  Hypertension. History of anemia.  History of secondary hyperparathyroidism.  History of partial hysterectomy.  History of coronary artery disease.  ALLERGIES:  SULFA, ANCEF.  DISCHARGE DIAGNOSIS:  Lymphocele x2 of the right thigh and right groin, possibly infected, status post evacuation of lymphocele and placement of new arterial limb of her AV Gore-Tex graft.  BRIEF HISTORY:  [name] is a 77 year old African-American female well-known to [facility].  She has a history of end-stage renal disease and she has had multiple previous surgeries for access.  Most recently in December of 2002, she underwent a simple thrombectomy of her right leg AV Gore-Tex graft by [name], [name]  At that time it was noted that she had extensive lymphoceles on the right groin and it was felt that her graft could not be revised at that time.  On March 19, dialysis note included an area of right thigh access draining plasm.  This was cultured and eventually was returned as positive for MRSA.  The [facility] was consulted and it was determined that she should undergo exploration of this thigh graft.  HOSPITAL COURSE:  On November 03, 2001, [name] was electively admitted to [facility] under the care of [name] for 23-hour admission.  She underwent the following surgical procedure; evacuation of lymphocele x2 of  the right groin and replacement of the arterial limb of her AV Gore-Tex graft.  She tolerated this procedure well and was transferred to unit 5500 after a short stay in the PACU.  She was seen in consultation by the renal service and they arranged for hemodialysis Friday evening, April 4. Dialysis was accomplished without difficulty in the venous limb of her graft. The morning of postoperative day #1, April 5, [name] is feeling well. The JP drains in her thighs were not draining much and they were removed.  Her wounds are satisfactory.  She was evaluated by [name] and it was his opinion that she is ready for discharge home today.  CONDITION ON DISCHARGE:  Improved.  DISCHARGE MEDICATIONS:  She has been given a prescription for Percocet one to two p.o. q.4-6h. p.r.n. for pain. Otherwise she has been instructed to resume her home medications.  ACTIVITY:  Activity as tolerated.  DIET:  She is to continue her renal diet.  WOUND CARE:  She is to change her dressing daily.  She may shower tomorrow, November 05, 2001.  FOLLOW-UP:  She has an appointment to see [name] at the [facility] Office on April 10, the office will call with the exact time for that appointment. Dictated by:   Gwenyth Bender, C.R.N.A. Attending Physician:  Alyson Locket DD:  11/04/01 TD:  11/06/01 Job: [id number]

## 2010-12-18 NOTE — H&P (Signed)
NAME:  [name], [name]                            ACCOUNT NO.:  [id number]   MEDICAL RECORD NO.:  [id number]                   PATIENT TYPE:  INP   LOCATION:  5153                                 FACILITY:  [facility]   PHYSICIAN:  James L. Deterding, [name]            DATE OF BIRTH:  [birth year]   DATE OF ADMISSION:  04/10/2002  DATE OF DISCHARGE:                                HISTORY & PHYSICAL   REASON FOR ADMISSION:  Abdominal pain and possible shingles.   HISTORY OF PRESENT ILLNESS:  The patient is a 77 year-old female who has end-  stage renal disease and hypertension.  She also has a history of coronary  artery disease and is status post a stent.  She has a history of anemia  secondary to hypothyroidism.  She presents now with worsening abdominal  pain.  This abdominal pain started approximately six days ago and she  thought it was gas and originally took some Gas-X for it.  It got worse and  she was seen in the emergency room last week, but we do not know the workup  of that at this time.  It was apparently thought to be benign.  She saw one  of the PA's at dialysis yesterday and a CT scan of her abdomen was ordered  but apparently it was thought not to be too severe at that time also, but it  has been progressive this week.  She has had more nausea and vomiting the  last two to three days, not holding down food or fluids, and when she does  eat it hurts and burns with an aching pain.  She has dry heaves a lot.  She  took some laxatives this weekend with a very good result.  Her bowels are  still working and she is passing gas.  She has this aching, burning pain.  She has no dysuria.  No vaginal discharge.  No blood in her stools.  She has  vomited up no blood.  She has no history of ulcers, diverticulitis or other  abdominal pathology.  The only abdominal operation she has had was a  hysterectomy and that was actually a vaginal hysterectomy.   She felt warm yesterday but no  documented fevers.  She has not had any  abdominal injury.  She is not taking any nonsteroidals at the current time.   REVIEW OF SYMPTOMS:  HEENT:  She denies any visual difficulties.  She denies  sores in her mouth.  She denies difficulty swallowing.  CARDIOVASCULAR:  She  has had no chest pain recently.  She sleeps on two pillows.  No PND.  Notes  some ankle edema worse.  She has a right groin graft.  She denies any  dyspnea on exertion but does not push herself a lot.  GI:  As mentioned  above.  PULMONARY:  She has had no cough or sputum  production. She is a non-  smoker.  She has no wheezing.  SKIN:  She notes some spots on her back that  actually started last week also.  Initially they were much more sore and are  not so sore now.  They itched a lot.  MUSCULOSKELETAL:  She has some various  aches and pains in her legs and hips, but not real severe.  EXTREMITIES:  She was evaluated by CVTS yesterday because she was having extending  lymphocele in the right groin where she has a graft.   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  History of vascular access thrombosis recurrently.  The  lymphocele was resected in the right groin approximately three months ago  but now it has a recurrence.  She had a cardiac cath with a stent in the RCA  in July 2002.  She has a history of anemia.   ALLERGIES:  Questionable allergy to VANCOMYCIN and SULFA and possible NSAID  sensitivity but this is not clear.   SOCIAL HISTORY:  She lives with her grandson.  She is separated from her  husband. No alcohol or tobacco.   FAMILY HISTORY:  Postive for renal disease in a son and a daughter.  She has  a second daughter with hypertension.   MEDICATIONS AT HOME INCLUDE:  1. Norvasc 5 mg h.s.  2. Tums EX two with meals.  3. Pepcid 20 mg a day.  4. Restoril 30 mg h.s.  5. Quinine sulfate one q.8h p.r.n.  6. Lopressor 25 mg b.i.d.  7. Aspirin 81 mg a day.  8. Lortab  one every six to eight hours as needed; she does not take that     very regularly.  9. Phenergan 12.5 mg every six hours as needed.  10.      Sorbitol 70% one to two tablespoons p.r.n.   OBJECTIVE:  GENERAL:  She is mild to moderate distress and very pleasant.  VITAL SIGNS:  Temperature is 98.0, blood pressure 173/79.  HEENT:  Reveals some scarring, questionable laser therapy in the right eye.  Ears are unremarkable.  Pharynx shows a whitish film on the tongue.  She has  shotty posterior cervical adenopathy. There is no thyromegaly in the neck.  LUNGS:  No rales, rhonchi or wheezes.  CARDIOVASCULAR:  There is an S4.  PMI is loudest at the fifth intercostal  space.  There are no heaves or thrills.  There is a grade 2/6 holosystolic  murmur best at the apex.  EXTREMITIES:  Decreased radial pulses bilaterally.  Multiple arm scars from  grafts in the upper arms.  She has decreased dorsalis pedis pulses but they  are present bilaterally.  There is a bruit in her right groin graft.  She  has a left femoral bruit also and a midline abdominal bruit which appears to  be radiating from the right groin.  ABDOMEN:  Positive bowel sounds and it is soft.  She has some tenderness but  really not rebound. She is more tender on the left than the right side of  the abdomen.  RECTAL:  Stool is guaiac negative.  PELVIC EXAM:  A bimanual was done.  She has an absent uterus and really not  tender adnexa.  SKIN:  She has multiple circular lesions on the back in two separate areas  in a dermatomal fashion with an eschar necrotic base.  They are raised and  erythematous.  They are not tender  at this time.   Review of the CT that was done today with contrast reveals some renal  atrophy and an area of density in the left lobe of the liver consistent with  __________  diffuse fatty infiltration and less likely subcapsular hemangioma, a slightly dilated gallbladder suggestive of fasting state.  It  did show the  right inguinal either hematoma or pseudoaneurysm or lymphocele.   ASSESSMENT:  1. Abdominal pain, rule out pancreatitis, rule out gastritis or peptic ulcer     disease.  Must consider shingles with the few lesions outside with the     main nerve branch being internally.  What we are seeing today is most     consistent with that.  She is not dehydrated at this time and there is no     evidence of bowel obstruction or bleeding or acute abdomen.  We do need     to do some evaluation and treat her to control the pain and make sure she     has decent intake.  2. Skin lesions.  This is most consistent with herpes zoster.  3. End-stage renal disease, due for dialysis on Wednesday.  4. Hypertension.  Resume medications.  5. Lymphocele of the right groin.  6. Anemia.  7. Secondary hyperparathyroidism.   PLAN:  1. Amylase, lipase, three way abdomen, check LFTs and white count.  2. Clear liquids p.o. and anti-nausea and pain control.  3. Continue other medications.  4. Hemodialysis planned as above.                                               James L. Deterding, [name]    JLD/MEDQ  D:  04/10/2002  T:  04/11/2002  Job:  [id number]

## 2010-12-18 NOTE — Op Note (Signed)
NAME:  [name], [name]                              ACCOUNT NO.:  [id number]   MEDICAL RECORD NO.:  [id number]                   PATIENT TYPE:   LOCATION:                                       FACILITY:   PHYSICIAN:  Quita Skye. Hart Rochester, [name]               DATE OF BIRTH:   DATE OF PROCEDURE:  DATE OF DISCHARGE:                                 OPERATIVE REPORT   PREOPERATIVE DIAGNOSES:  1. Thrombosed right thigh arteriovenous Gore-Tex graft with end-stage renal     disease.  2. Large recurrent lymphocele, right thigh.   POSTOPERATIVE DIAGNOSES:  1. Thrombosed right thigh arteriovenous Gore-Tex graft with end-stage renal     disease.  2. Large recurrent lymphocele, right thigh.  3. 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   OPERATIONS:  1. Attempted insertion of Diatech catheter in right internal jugular vein,     unsuccessful.  2. Attempted insertion of Diatech catheter in left internal jugular vein,     unsuccessful.  3. Simple thrombectomy of right thigh arteriovenous Gore-Tex graft.  4. Ultrasound visualization of bilateral internal jugular veins.   SURGEON:  Quita Skye. Hart Rochester, [name]   FIRST ASSISTANT:  Claudette TNils Flack, R.N.   ANESTHESIA:  Local.   DESCRIPTION OF PROCEDURE:  The patient was taken to the operating room and  placed in the Trendelenburg position, at which time the upper chest and neck  were prepped and draped with Betadine solution and draped in the routine  sterile manner.  After infiltration with 1% Xylocaine, an attempt was made  to enter the right internal jugular vein using a supraclavicular approach.  I did get venous return, but the guide wire would not pass centrally.  A  contrast injection was made and this revealed what appeared to be total  occlusion of the right internal jugular vein in the chest with no filling of  the superior vena cava.  Some collaterals from the azygos system were  visualized.  Following this, an attempt was made on the left side and a  deep  vein was entered, but again a guide wire would not pass into the central  system.  It was suspected that there was central vein occlusion.  Therefore,  the attempt was aborted to insert a central venous hemodialysis catheter.  The right thigh was then prepped and draped in routine sterile manner with  Betadine scrub and solution.  A short incision was made after infiltration  with 1% Xylocaine at the apex of the loop graft in the right thigh.  The  graft was dissected free with a 15 blade.  No heparin was administered.  A  transfer opening was made in the graft.  The graft was filled with fresh  thrombus.  A 5 Fogarty catheter was passed around the arterial end.  Excellent end flow was reestablished with no stenosis noted.  The Fogarty  was passed around  the venous end and would not traverse the venous  anastomosis, although the venous limb of the graft was easily  thrombectomized and there was excellent back bleeding and heparin and saline  could be flushed under low resistance.  Many other passes were made on both  the venous and arterial sides with no further  findings.  The graft was closed with two 6-0 Prolene sutures.  The clamp was  released and there was good Doppler flow and good pulse in the graft.  The  wound was closed with Vicryl in a subcuticular fashion.  A sterile dressing  was applied.  The patient was taken to the recovery room in satisfactory  condition.                                               Quita Skye Hart Rochester, [name]    JDL/MEDQ  D:  04/13/2002  T:  04/15/2002  Job:  [id number]

## 2010-12-18 NOTE — Op Note (Signed)
NAME:  [name], [name]                            ACCOUNT NO.:  [id number]   MEDICAL RECORD NO.:  [id number]                   PATIENT TYPE:  INP   LOCATION:  5508                                 FACILITY:  [facility]   PHYSICIAN:  Quita Skye. Hart Rochester, [name]               DATE OF BIRTH:  [birth year]   DATE OF PROCEDURE:  04/16/2002  DATE OF DISCHARGE:                                 OPERATIVE REPORT   PREOPERATIVE DIAGNOSIS:  End-stage renal disease.   POSTOPERATIVE DIAGNOSIS:  End-stage renal disease.   PROCEDURE:  Insertion of a left thigh superficial femoral artery to  saphenous vein graft AV Gore-Tex graft (6 mm).   SURGEON:  Quita Skye. Hart Rochester, [name]   ASSISTANT:  Loura Pardon.   ANESTHESIA:  General endotracheal.   DESCRIPTION OF PROCEDURE:  The patient was taken to the operating room and  placed in the supine position at which time satisfactory general  endotracheal was administered.  The left thigh was prepped with Betadine  scrub and solution and draped in the routine sterile manner. A short  longitudinal incision was made just distal to the inguinal crease and  superficial femoral artery was dissected free up to its origin and encircled  with vessel loops.  It had an excellent pulse and was free of disease.  The  saphenous vein was dissected free down to the saphenofemoral junction.  A  loop-shaped tunnel was then created and a 6 mm Gore-Tex graft (40 cm) was  delivered through the tunnel.  Using a small counter incision at the apex of  the loop.  3000 units of heparin were given intravenously.  Superficial  femoral artery was occluded proximally and distally with vessel loops and  opened with a 15 blade, extended with Pott's scissors.  Graft was slightly  spatulated and anastomosed end-to-side with 6-0 Prolene.  The saphenofemoral  junction was then occluded at the common femoral venous level and the  saphenous branch ligated distally.  Longitudinal opening made in the  saphenous  vein with 15 blade and extended with Potts scissors up to the  saphenofemoral junction. A 6 mm Gore-Tex was then spatulated and anastomosed  end-to-side with 6-0 Prolene.  Clamps were released and there was good pulse  and thrill in the graft.  No protamine was given and the wound was irrigated  with saline and closed in layers with Vicryl in subcuticular fashion.  A  sterile dressing applied.  The patient was taken to the recovery room in  satisfactory condition.                                               Quita Skye Hart Rochester, [name]    JDL/MEDQ  D:  04/16/2002  T:  04/16/2002  Job:  [id number]

## 2010-12-18 NOTE — Op Note (Signed)
[place]  Patient:    [name], [name] Visit Number: [id number] MRN: [id number]          Service Type: DSU Location: [facility] 2899 12 Attending Physician:  Caralee Ates Dictated by:   Quita Skye Hart Rochester, [name] Proc. Date: 08/28/01 Admit Date:  08/28/2001 Discharge Date: 08/28/2001                             Operative Report  PREOPERATIVE DIAGNOSIS:  Thrombosed arteriovenous Gore-Tex graft, right thigh.  POSTOPERATIVE DIAGNOSES: 1. Thrombosed arteriovenous Gore-Tex graft, right thigh. 2. Central vein occlusion.  OPERATIONS: 1. Thrombectomy of arteriovenous Gore-Tex graft, right thigh. 2. Attempted insertion of a Diatek catheter in the right internal jugular    vein, unsuccessful due to probable central vein occlusion.  SURGEON:  Quita Skye. Hart Rochester, [name]  FIRST ASSISTANT:  Gina H. Collins, P.A.-C.  ANESTHESIA:  General endotracheal.  DESCRIPTION OF PROCEDURE:  The patient was taken to the operating room and placed in the supine position, at which time satisfactory general endotracheal anesthesia was administered.  The right thigh was prepped with Betadine scrub and solution and draped in a routine sterile manner.  A transverse incision was made at the apex of the loop of the graft.  The graft was dissected free. A transverse opening was made in the graft and the 5 Fogarty catheter easily passed around the arterial end.  Adequate inflow was reestablished.  The catheter was then passed around the venous end, which had some mild narrowing near the venous anastomosis by the Fogarty catheter.  Excellent back bleeding was obtained.  The graft was reclosed with two 6-0 Prolene sutures and there was an excellent pulse and thrill in the graft.  The graft had been previously explored in the inguinal region and felt to be not revisable because of multiple organized lymphocytes.  Adequate hemostasis was achieved.  The wound was closed with Vicryl in a  subcuticular fashion.  A sterile dressing was applied.  Having closed the thigh wound, attention was then turned to the upper chest, which was prepped with Betadine solution and draped in a routine sterile manner.  The patient was placed in the Trendelenburg position and the right internal jugular vein was entered percutaneously.  A guide wire was passed centrally using fluoroscopic guidance, but would not pass into the right atrium.  There appeared to be a duplication of central venous systems with no direct communication to the right atrium via the right superior vena cava.  Some contrast was injected and there was no direct filling of the superior vena cava.  This possibly represented a large collateral.  Because it was a large vein, I was attempting to insert the Diatek catheter in position in this vein.  Therefore after dilating it over the tract of the guide wire, the catheter was passed through a peel-away sheath and positioned in appropriate fashion.  It had excellent back bleeding.  Then while attempting to use the tunneler for the Diatek catheter in a superficial position to bring the catheter out in the anterior chest wall, some abundant venous bleeding occurred possibly by a superficial collateral vein because of the central vein occlusion.  Therefore, the tunneling device was removed and compression applied for 10 minutes and hemostasis achieved.  An attempt at inserting this through the right internal jugular vein was aborted.  Once attempt was made to stick the left internal jugular vein and back  bleeding was obtained, but a guide wire would not pass centrally, suggesting central vein occlusion on the left side as well.  The guide wires were removed.  The wound was closed with Vicryl in a subcuticular fashion.  A sterile dressing was applied.  The patient was taken to the recovery room in satisfactory condition. Dictated by:   Quita Skye Hart Rochester, [name] Attending Physician:  Caralee Ates. DD:  08/28/01 TD:  08/28/01 Job: [id number]

## 2010-12-18 NOTE — Discharge Summary (Signed)
NAME:  [name], [name]                            ACCOUNT NO.:  [id number]   MEDICAL RECORD NO.:  [id number]                   PATIENT TYPE:  INP   LOCATION:  5508                                 FACILITY:  [facility]   PHYSICIAN:  James L. Deterding, [name]            DATE OF BIRTH:  04-Jun-1932   DATE OF ADMISSION:  04/10/2002  DATE OF DISCHARGE:  04/18/2002                                 DISCHARGE SUMMARY   DISCHARGE DIAGNOSES:  1. Abdominal pain secondary to herpes zoster.  2. End-stage renal disease on chronic hemodialysis.  3. Hypertension.  4. Clotted right thigh graft with lymphocele.  5. Iron-deficiency anemia.  6. Secondary hyperparathyroidism.   PROCEDURES:  1. Thrombectomy and revision, right AV Gortex graft, Dr. Hart Rochester, 04/13/02.  2. Placement of new left AV Gortex graft, 04/16/02, Dr. Hart Rochester.  3. Hemodialysis.   CONSULTANTS:  1. Dr. Hart Rochester, CVTS.  2. Dr. Ninetta Lights, infectious disease.   HISTORY OF PRESENT ILLNESS:  [name] is a 77 year old African-American  female who has end-stage renal disease and hypertension. She also has a  history of coronary artery disease and is status post stent placement. She  has a history of anemia and secondary hypothyroidism. She now presents with  worsening abdominal pain which started about six days prior. She thought it  was gas and took some Gas-X. It got worse, and she was seen in the emergency  room last night, but the workup is not known at this time; apparent, it was  benign. The patient saw one of the P.A.s at the dialysis center the day  prior, and a CAT scan of her abdomen was ordered but apparently was not  thought to be so severe at that time but had been progressive this week. The  patient has had a little more nausea and vomiting the past two to three  days, not able to hold down foods or fluids, and when she eats, it hurts and  burns like an aching pain. She has dry heaves a lot. She took some laxatives  this weekend with  good results. Her bowels are still working, and she has  passed some gas. She has had aching and burning pains. She has no dysuria,  no vaginal discharge, no blood in her stools. She has vomited up no blood.  She has no history of ulcers, diverticulitis, or any other abdominal  pathology. The only abdominal operations she had was a hysterectomy which  actually was a vaginal hysterectomy. The remainder of physical exam is  outlined in the admission history and physical. CT done on the day of  admission showed some renal atrophy and area of density in the left lobe of  the liver, consistent with fatty infiltration and less likely subscapular  hemangioma and slightly dilated gallbladder suggestive of fasting state. It  did show the right inguinal either hematoma or pseudoaneurysm or lymphocele.   HOSPITAL COURSE:  The patient was admitted to the renal unit. Labs drawn on  admission showed a white count of 7.6, hemoglobin of 14.1, platelets  232,000, normal differential. PTT/PT within normal limits. Sodium 135,  potassium 4.8, chloride 90, CO2 30, glucose 79, BUN 31, creatinine 10.6,  calcium 9.5. Albumin 3.5. Liver function tests within normal limits.  Phosphorous 5.4. Urinalysis unremarkable. The patient was continued on her  usual medications. She had rash noted on her abdominal area for the last 72  hours that was very tender to touch. Infectious disease was called and  recommended starting patient on Valtrex. IgG and IgM levels were checked.  Both were strongly positive for varicella zoster although the patient has no  recollection of either having chicken pox as a child. She was treated with a  week-long course of Valtrex during her hospitalization and with resolution  of her symptoms. Also complicated her hospitalization was that her right  thigh graft clotted. Has had a known history of a large lymphocele. She was  initially going to radiology for thrombolysis; however, because of the   lymphocyte, they declined and she was taken for thrombectomy on 9/12 by Dr.  Hart Rochester. Both felt that the life of this graft was not going to be long.  Therefore, she had a left thigh graft placed 9/15, and hopefully, this would  last until that time. She had unsuccessful attempt bilaterally for IJ  catheter placements with a history of stenosis and occluded subclavian veins  as well. The patient did note some jitteriness at the end of her  hospitalization. Her Reglan was discontinued, and she had stopped her  Valtrex by that time. We will see if this will relief these symptoms. Having  no further problems, she was able to be discharged home on 9/17.   CONDITION ON DISCHARGE:  Improved.   DISCHARGE MEDICATIONS:  1. Norvasc 5 mg q.h.s.  2. PhosLo two tablets with meals three times a day.  3. Lopressor 25 mg b.i.d.  4. Nephro-Vite one tablet per day.  5. Percocet one to two tablets q.6h. p.r.n. pain.   She was instructed to stop Reglan and see if this helped her jumpiness.   DISCHARGE DIET:  80-g protein, 3-g sodium, 2-g potassium diet, fluids 1200  cc per day.   WOUND CARE:  Place dry guaze in the incision of left groin every morning for  the next four to five days.   FOLLOW UP:  Followup appointment with Dr. Hart Rochester 10/14 at 10:30 a.m.   SPECIAL INSTRUCTIONS:  Dialysis schedule will continue to be Monday,  Wednesday, Friday at [facility]. Estimated dry weight is 78  kg. Epogen dose is 9,000 units IV every dialysis. Calcijex 1.0 mcg IV every  hemodialysis. InFeD is 50 mg IV every week.     Weston Settle, PA                        Llana Aliment. Deterding, [name]    MB/MEDQ  D:  05/08/2002  T:  05/10/2002  Job:  [id number]   cc:   Quita Skye. Hart Rochester, [name]  [address]  [place]  [place] [address]  Fax: [phone]   [facility]

## 2010-12-18 NOTE — Discharge Summary (Signed)
[place]. [facility]  Patient:    [name], [name] Visit Number: [id number] MRN: [id number]          Service Type: MED Location: [phone] Attending Physician:  Garnetta Buddy Dictated by:   Loura Pardon, P.A. Admit Date:  04/19/2001 Discharge Date: 04/24/2001   CC:         [name], [name]  [name], [name]  [name], [name]   Discharge Summary  DATE OF BIRTH:  27-Nov-1931  ATTENDING SURGEON:  Dr. Brayton Layman.  CARDIOLOGIST:  Dr. Susa Griffins.  NEPHROLOGIST:  Dr. Camille [name], Dr. Marcelino [name].  DISCHARGE DIAGNOSES: 1. End-stage renal disease with occlusion of both right brachiocephalic and    left subclavian veins with need for lower extremity arteriovenous Gore-Tex    graft to continue hemodialysis Monday, Wednesday, Friday. 2. Atherosclerotic coronary artery disease with demonstration of unstable    angina pectoris postoperatively. 3. Persistent hypotension postoperatively with unresponsive episode on    postoperative day #3 after placement of right thigh arteriovenous Gore-Tex    graft.  SECONDARY DIAGNOSES: 1. Hypertension. 2. Status post hysterectomy. 3. Status post venogram January 01, 2001.  PROCEDURES: 1. February 16, 2001, right thigh arteriovenous Gore-Tex graft, Dr. Elyn Peers. 2. February 20, 2001, left heart catheterization, Dr. Delrae Rend.  The study    showed an 80% stenosis at the midpoint of the right coronary artery.  There    were calcific 50% tandem lesions in the proximal and mid left anterior    descending coronary artery.  The diagonal had an ostial 40-50% stenosis.    There was no left main coronary artery.  The left anterior descending    coronary artery and the left circumflex have separate ostia. 3. February 22, 2001, percutaneous transluminal coronary angioplasty and stenting    of the right coronary artery, Dr. Susa Griffins.  DISPOSITION:  [name] was discharged from [facility]  on February 24, 2001, after [name] days at [facility].  At the time of discharge her blood pressure was 139/60, her pulse was a sinus rhythm without murmur at 86.  Her mental status was clear.  Her lungs were also clear.  A right thigh graft, which had been placed February 16, 2001, was patent and had a thrill.  She did have some mild tenderness at the surgical site. The incisions, however, were healing nicely.  She had remained afebrile during this hospital stay.  Her stay had originally been planned for one day.  It was prolonged because she had experienced substernal chest pain with left arm numbness in the immediate postoperative period.  Cardiac enzymes were obtained which was negative.  She was given an aspirin and nitroglycerin tablet with relief of the pain and started on a beta blocker.  However, she had been having episodes of hypotension which had hindered efforts at hemodialysis. She was followed by [facility], with whom she had had some previous contact.  It was felt that her hypotension was partly due to intraoperative acute blood loss anemia, and it was planned that she would have a stress test as an outpatient should she be discharged.  However, on postoperative day #2 her blood pressure fell markedly during hemodialysis, and she became unresponsive.  She was successfully resuscitated with fluid challenge and stable blood pressure.  It was decided that she would have a left heart catheterization.  This was done February 20, 2001, with finding of well-preserved  left ventricular function with an ejection fraction of 65%, also with left ventricular hypertrophy and single-vessel obstructive coronary artery disease in the mid right coronary artery.  She was then scheduled for PTCA/stenting of the right coronary artery on February 22, 2001, by Dr. Susa Griffins.  She also received hemodialysis the same day with good blood pressures and, indeed, her blood pressures  for the remainder of her hospital stay from postoperative day #6 to postoperative day #9 were stable and acceptable.  She was restarted on her antihypertensives including Norvasc and Lopressor.  She did go home on the following medications on February 24, 2001.  MEDICATIONS:  1. Nephro-Vite 1 tablet daily.  2. Ferrous sulfate 325 mg 1 tablet three times a day between meals.  3. Calcium 500 mg 3 tablets three times a day with meals.  4. Plavix 75 mg daily for 30 days, then aspirin 81 mg daily.  5. Norvasc 10 mg 1 p.o. q.d.  6. Lopressor 12.5 mg 1 p.o. b.i.d.  7. Quinine sulfate 325 mg b.i.d. as needed.  8. Celexa 20 mg daily.  9. Coumadin as directed. 10. Pepcid 20 mg daily 11. Folic acid 1 mg daily.  DIET:  80 g protein, 2 g sodium, 2 g potassium renal diet.  Limit fluids to five cups per day.  DIALYSIS INSTRUCTIONS:  Hemodialysis will be Monday, Wednesday, Friday. Access will be the right internal jugular Ash catheter.  She does have a newly placed right thigh arteriovenous Gore-Tex graft.  The surgical sites are healing well.  The graft has remained patent with a thrill.  HISTORY OF PRESENT ILLNESS:  [name] is a 77 year old African-American female.  She has a known history of end-stage renal disease.  She is on hemodialysis and has had multiple previous access grafts placed.  The patient was referred by Dr. Camille [name] for evaluation of arteriovenous Gore-Tex graft placed in the lower extremity.  She had an upper extremity venogram June 2002.  The study demonstrated occlusion of both her central veins, both the right brachiocephalic and the left subclavian vein.  She does have excellent pulses in both lower extremities.  The patient is scheduled for a right thigh arteriovenous Gore-Tex graft for placement February 16, 2001, by Dr. Elyn Peers.   HOSPITAL COURSE:  The patient presented to [facility] February 16, 2001.  The right thigh arteriovenous Gore-Tex graft was  placed without complications.  However, in the postoperative period [name] reported substernal chest pain and left upper extremity numbness.  She also demonstrated hypotension in the postoperative period, which was exacerbated by hemodialysis and, in fact, she did become unresponsive on postoperative day #3 in hemodialysis.  She responded to a fluid resuscitation with stabilization of blood pressure from 70 systolic to 120 systolic.  She was followed by [facility] during the postoperative period and offered both left heart catheterization and PTCA/stenting of the right coronary artery.  At the time of discharge her blood pressure was stable.  She was tolerating antihypertensive medications and would have follow-up with Dr. Susa Griffins March 09, 2001, at 10:50 a.m. Dictated by:   Loura Pardon, P.A.  Attending Physician:  Garnetta Buddy DD:  05/15/01 TD:  05/16/01 Job: [id number]

## 2010-12-18 NOTE — Discharge Summary (Signed)
[place]. [facility]  Patient:    [name], [name]                         MRN: [id number] Adm. Date:  [id number] Disc. Date: [id number] Attending:  Caralee Ates Dictator:   Donald Pore, P.A. CC:         [facility]   Discharge Summary  ADMISSION DIAGNOSES: 1. Fever with recent yeast sepsis, Staphylococcus aureus catheterization    site infection. 2. End-stage renal disease on chronic hemodialysis. 3. Hypertension. 4. Depression. 5. Status post transient ischemic attack.  DISCHARGE DIAGNOSES: 1. Methicillin-sensitive Staphylococcus sepsis secondary to hemodialysis    catheter infection. 2. Recent yeast sepsis. 3. End-stage renal disease on chronic hemodialysis. 4. Anemia of chronic disease. 5. Depression.  BRIEF HISTORY AND REASON FOR THE ADMISSION:  [name] is a 29 year old black female with end-stage disease secondary to FSG with multiple catheter infections in the past, most recently yeast sepsis on Dec 05, 2000.  Diflucan course was to be completed on January 02, 2001.  The patient presented to the hemodialysis outpatient with fever of 99.1, chills while on hemodialysis.  Her new hemodialysis catheter was placed on Dec 09, 2000 secondary to yeast sepsis and she has had numerous problems with the catheter with multiple rates and bleeding. She required two sutures on two separate occasions.  The last placed on Dec 22, 2000 by the CVTS.  Her temperature spiked while on dialysis and she received 2 grams of Ancef and 100 mg of Tobramycin the day of admission as well as blood cultures drawn times two.  Her exit site on Dec 19, 2000 grew Ancef sensitive Staphylococcus aureus.  She is admitted now for a catheter removal and further workup of her febrile illness.  She has refused graft placement in the past.  Noted only wanting to use catheters for dialysis access.  PHYSICAL EXAMINATION:  On admission her temperature was 102 by the time  she was released from the kidney center.  Blood pressure was 120/60.  She appeared her stated age and was somewhat distressed with chilling.  HEENT: Neck was supple.  Left subclavian catheter site was oozing blood.  No purulence noted. New sutures were noted to be in place.  Chest was clear to auscultation bilaterally.  Cardiac exam revealed an S1 and S2.  No rub noted.  No murmur noted.  Abdomen: Bowel sounds were positive and nontender.  Extremities revealed left upper extremity edema, mild. No peripheral edema noted.  COURSE IN HOSPITAL:  #1 - Methicillin-sensitive Staphylococcus aureus sepsis.  The patient admitted from the hemodialysis unit with a febrile illness and discharge from her catheter.  She has had failed grafts in both upper extremities, saying that she does not want a leg graft placed.  Her present dialysis catheter was only two weeks old, poorly functioning again and bleeding off and on since placement.  The Ash catheter was removed after CVTS was consulted. Catheter tip was sent for culture and sensitivity and also grew out infection of Staphylococcus aureus.  The patient was placed on IV antibiotic therapy of Tobramycin, Ancef and in addition Diflucan because this was to be completed by January 02, 2001 from previous yeast sepsis.  With hemodialysis access out daily BMETs were drawn and volume was monitored closely in patient.  She was feeling much better 24 hours after catheter was removed.  Volume status was stable and fever was done.  Dr. Darrick Penna placed a hemodialysis catheter on Dec 27, 2000. Potassium checked at that time was 4.5 with creatinine of 0.3.  BUN of 62. Volume status was stable.  Hemodialysis was not initiated until the day of discharge, Dec 28, 2000 four days after the last hemodialysis treatment.  She was deemed fair for discharge by Dr. Caryn Section and Dr. Darrick Penna and will complete her antibiotic therapy and Ancef as an outpatient at the kidney center  and also continue her Diflucan, which was dosed for recent yeast sepsis with last dose of that January 02, 2001.  For the remainder of the hospital stay, the patient was refusing an AV Gore-Tex graft.  Intensified counselling with risks of endocarditis and osteomyelitis explained to her.  DISCHARGE MEDICATIONS:  1. Ancef 2 gm q.hemodialysis for three weeks total therapy.  2. Diflucan 200 mg q.d.  3. Celexa 20 mg q.d.  4. Norvasc 5 mg q.d.  5. Protonix 40 mg q.d.  6. Ecotrin one q.d.  7. Epogen 15,000 units q.hemodialysis.  8. Calcijex 0.5 mcg q.dialysis.  9. Nephro-Vite one q.d. 10. Calcium carbonate 500 mg one a.c. 11. InFeD 100 mg IV q.hemodialysis times five and then 100 mg q.weekly. DD:  03/07/01 TD:  03/09/01 Job: [id number]

## 2010-12-18 NOTE — Consult Note (Signed)
[place]. [facility]  Patient:    [name], [name] Visit Number: [id number] MRN: [id number]          Service Type: DSU Location: [phone] Attending Physician:  Melvenia Needles Dictated by:   Garnetta Buddy, [name] Proc. Date: 07/29/01 Admit Date:  07/29/2001                            Consultation Report  REFERRING PHYSICIAN:  Denman George, [name]  REASON FOR ADMISSION:   The patient was admitted for thrombectomy of right thigh graft.  PAST MEDICAL HISTORY/PAST SURGICAL HISTORY:  Pertinent for: 1. FSGS with end-stage renal disease. 2. Hypertension. 3. Vascular access thrombosis. 4. Catheter infections. 5. Anemia. 6. Secondary hyperparathyroidism. 7. History of partial hysterectomy. 8. Coronary artery disease, status post catheterization and stent to RCA.  MEDICATIONS: 1. Norvasc 10 mg p.o. q.d. 2. Vicodin one to two tablets q.4-6h. p.r.n. 3. Calcium 500 mg three tablets q.a.c. 4. Quinine one b.i.d. 5. Aspirin 81 mg q.d. 6. Amitriptyline 25 mg q.h.s. 7. Temazepam 30 mg q.h.s. 8. Nephro-Vite one q.d. 9. Pepcid 20 mg q.d.  SOCIAL HISTORY:  She lives with her grandson. She worked at ______ [employer]. She is negative for tobacco and alcohol use.  FAMILY HISTORY:  She had a son die who was dialysis dependent with an intracranial bleed. It is pertinent for hypertension.  REVIEW OF SYSTEMS:  Negative unless otherwise stated. Denies any chest pain, shortness of breath. No fevers, chills, or sweats. Denies any nausea, vomiting, diarrhea. She is scheduled for carpal tunnel surgery for paresthesias in her hand.  PHYSICAL EXAMINATION:  GENERAL:  Ill-appearing, African-American female in no obvious distress.  VITAL SIGNS:  Blood pressure 160/90, pulse of 73, temperature afebrile.  HEENT:  Head and eyes:  Normocephalic, atraumatic. Pupils are equal, round, and reactive. Extraocular movements intact. Ears, nose, mouth, throat: External  appearance was normal.  CARDIOVASCULAR:  Faint systolic murmur left sternal edge, sustained displaced apex beat.  RESPIRATORY:  Lung fields are clear to auscultation. Good air entry. No wheezes or rales.  ABDOMEN:  Soft, obese, nontender, nondistended. Positive bowel sounds. No bruits.  NEUROLOGICAL:  Cranial nerves 2 through 12 intact. Reflexes were downward going and symmetric. Sensory deficits noted in the lower extremities.  SKIN:  Warm with a right thigh graft.  LABORATORY DATA:  Sodium 137, potassium 6.1, chloride 96, CO2 23, BUN 110, creatinine 14.6, glucose 73, calcium 7.8. INR 1.3.  ASSESSMENT AND PLAN: 1. 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    Multiple access thromboses. Right femoral graft thrombosis. 2. Hyperkalemia. Status post thrombectomy. We will dialyze on 0K bath for one    hour, then followed by a 2 potassium bath for the remainder of her dialysis    treatment. Dictated by:   Garnetta Buddy, [name] Attending Physician:  Melvenia Needles DD:  07/29/01 TD:  07/30/01 Job: [id number]

## 2010-12-18 NOTE — Discharge Summary (Signed)
NAME:  [name], [name]                            ACCOUNT NO.:  [id number]   MEDICAL RECORD NO.:  [id number]                   PATIENT TYPE:  INP   LOCATION:  5530                                 FACILITY:  [facility]   PHYSICIAN:  Quita Skye. Hart Rochester, [name]               DATE OF BIRTH:  05-Jul-1932   DATE OF ADMISSION:  02/10/2002  DATE OF DISCHARGE:  02/14/2002                                 DISCHARGE SUMMARY   DISCHARGE DIAGNOSES:  Expanding, organized lymphocele surrounding right  thigh arteriovenous Gore-Tex graft with arterial and venous anastomoses.   SECONDARY DIAGNOSIS:  1. End-stage renal disease x40 years with hemodialysis Monday, Wednesday and     Friday at [facility].  2. Hypertension.  3. History of anemia.  4. Secondary hyperparathyroidism.  5. Status post partial hysterectomy.  6. History of atherosclerotic coronary artery disease status post left heart     catheterization with stenting July 2002.  7. Status post placement of multiple access grafts to the upper extremities.  8. Placement of right thigh AV Gore-Tex graft with revision September 2002.  9. Lymphocele of the right groin and right thigh evacuated April 2003.   PROCEDURE PERFORMED:  1. February 10, 2002, excision of organized lymphocele surrounding AV Gore-Tex     graft, right thigh.  2. Thrombectomy and revision of right thigh arteriovenous Gore-Tex graft,     Dr. Quita Skye. Hart Rochester, Careers adviser.   DISCHARGE DISPOSITION:  The patient is ready for discharge on postoperative  day #4.  She has been afebrile in the postoperative period.  She has been  covered with antibiotics postoperatively.  The fluid taken intraoperatively  grew negative cultures.  The drain was kept in place in the right thigh for  four days and then removed on the day of discharge.  She was seen in  consultation this hospitalization by the physicians of the [facility].  She was maintained on hemodialysis on her regular schedule of  Monday, Wednesday, and Friday.  Her pain was controlled well  postoperatively.  She was discharged home on the following medications:   DISCHARGE MEDICATIONS:  1. Vicodin 1-2 tablets p.o. q.4-6h. p.r.n. pain.  2. Norvasc 5 mg daily.  3. Enteric-coated aspirin 81 mg daily.  4. Protonix 40 mg daily.  5. Restoril 30 mg p.o. at bedtime.  6. Nephro-Vite daily.  7. Tums 300 mg two tablets before meals.  8. Quinine sulfate 325 mg p.o. b.i.d. as needed.   BRIEF HISTORY:  The patient is a 77 year old, African American female who is  status post evacuation of lymphocele in the right thigh and right groin  November 03, 2001, by Dr. Gretta Began.  She presents today, July 12, with an  increased mass in the right groin and with increased pain.  It is felt to be  a return of her lymphocele, but it could possibly be an expanding  pseudoaneurysm.  The patient was taken to the operating room emergently for  exploration.   HOSPITAL COURSE:  Hospital course is as described in the discharge  disposition.  The patient was admitted February 10, 2002, to [facility]  with expansion of an organized lymphocele in the right groin and thigh.  She  was taken emergently to the operating room on the possibility that it was an  expanding pseudoaneurysm.  Intraoperatively it was found that she did have a  lymphocele.  The fluid was cultured and it was negative.  Her postoperative  progress lasted four days.  The drainage from the Jackson-Pratt drain placed  intraoperatively had decreased markedly by postoperative day #4 and it was  removed.  She goes home with the medications listed above and with followup  with Dr. Hart Rochester two weeks after discharge.   CONDITION ON DISCHARGE:  Stable and improved.   DISCHARGE DIET:  Renal diet.   DISCHARGE ACTIVITIES:  To walk using a walker as she had before surgery.   FOLLOWUP APPOINTMENTS:  She will return to [facility] for hemodialysis on  Monday, Wednesday and  Friday.     Maple Mirza, PA                      Quita Skye. Hart Rochester, [name]    GM/MEDQ  D:  03/15/2002  T:  03/19/2002  Job:  [id number]   cc:   Quita Skye. Hart Rochester, [name]   [facility]

## 2010-12-18 NOTE — Op Note (Signed)
[place]. [facility]  Patient:    [name], [name]                         MRN: [id number] Proc. Date: 02/26/01 Adm. Date:  [id number] Attending:  Caralee Ates.                           Operative Report  PREOPERATIVE DIAGNOSIS:  End-stage renal disease.  POSTOPERATIVE DIAGNOSIS:  End-stage renal disease.  OPERATION PERFORMED:  Right thigh arteriovenous graft.  SURGEON:  Caralee Ates, [name]  ASSISTANT:  Dominica Severin, P.A.  ESTIMATED BLOOD LOSS:  20 cc.  DRAINS:  None.  SPECIMENS:  None.  COMPLICATIONS:  None.  ANESTHESIA:  General endotracheal.  INDICATIONS FOR PROCEDURE:  The patient is a 77 year old white female with end-stage renal disease who has had multiple lower extremity arteriovenous grafts and fistulas on both sides.  Most recently she has been dialyzed through a right internal jugular Perma-Cath catheter.  Since she has exhausted her opportunities for upper extremity arteriovenous access and in need of long term term hemodialysis access, she was scheduled for a right thigh AV graft. Risks, benefits and alternatives to the procedure were explained in detail to the patient and she agreed to proceed.  DESCRIPTION OF PROCEDURE:  The patient was brought to the operating room and placed on the operating table in supine position.  Following adequate general endotracheal anesthesia, the right groin and lower extremity were prepped and draped in sterile fashion.  A transverse incision was made just inferior to the inguinal crease on the right hand side.  The wound was deepened using the Bovie to control bleeding.  The superficial femoral artery was identified at this level and traced proximally to the distal common femoral artery.  The SFA, profunda and common femoral artery were then encircled with vessel loops. Next, the saphenofemoral junction was identified and the saphenous vein was encircled with a vessel loop as was the common femoral vein  proximally and distally.  Next, a counter incision was made distally at the planned apex  of the loop AV graft.  A curved tunneler was then used to pass a 6 mm PTFE graft in loop fashion.  Next, the patient was systemically heparinized.  Following an adequate 3 minute circulation time, the vessel loops around the vein were tightened proximally and distally to occlude flow.  A longitudinal venotomy was performed.  The end of the graft was then spatulated appropriate size and then sewn into position in end-to-side fashion with a running 6-0 PTFE suture. Following completion of the anastomosis, a clamp was placed on the graft and flow was restored to the femoral vein.  There was no significant bleeding from the anastomotic suture line.  The vessel loops around the SFA, profunda and common femoral artery were tightened to occlude flow.  The longitudinal arteriotomy was begun on the SFA and extended proximally just onto the distal common femoral artery.  The graft was then trimmed to the appropriate length and spatulated.  An end-to-side anastomosis was created with 6-0 PTFE suture. Prior to placement of the anastomosis the native vessels were back-bled and the graft was flushed.  The anastomosis was then completed and the vessel loops were released restoring flow to the right lower extremity and into the graft.  At this point there was an excellent thrill in the graft and no significant bleeding from the anastomotic  suture line.  The wound was then irrigated copiously with warm sterile saline and the wounds were closed in layers of 3-0 Vicryl followed by skin closure of 4-0 Monocryl subcuticular stitch.  Dry sterile dressings were applied.  The patient was then awakened from anesthesia and transferred to the recovery room in stable condition.  The patient tolerated the procedure well.  There were no complications.  All sponge and needle counts were reported as correct. DD:  02/16/01 TD:   02/17/01 Job: [id number]

## 2010-12-18 NOTE — Discharge Summary (Signed)
NAME:  [name], [name]                  ACCOUNT NO.:  [id number]   MEDICAL RECORD NO.:  [id number]          PATIENT TYPE:  INP   LOCATION:  5508                         FACILITY:  [facility]   PHYSICIAN:  Aram Beecham B. Eliott Nine, M.D.DATE OF BIRTH:  [birth year]   DATE OF ADMISSION:  08/11/2004  DATE OF DISCHARGE:  08/16/2004                                 DISCHARGE SUMMARY   ADMISSION DIAGNOSES:  1.  Fever and leukocytosis in renal transplant patient.  2.  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  3.  Hypertension.  4.  Anemia of chronic disease.  5.  Mild hyperparathyroidism.  6.  History of coronary artery disease.   DISCHARGE DIAGNOSES:  1.  Group A Streptococcal bacteriemia.  2.  Escherichia coli and Enterococcal urinary tract infection.  3.  Cadaveric renal transplant with creatinine at discharge 0.2.  4.  Diarrhea, C. difficile negative.  5.  Hypertension.  6.  Hyperparathyroidism.  7.  History of coronary artery disease.  8.  Cholelithiasis without cholecystitis.   HISTORY OF PRESENT ILLNESS:  A 77 year old black female with end-stage renal  disease secondary to FSGS status post cadaveric renal transplant was seen in  the emergency room earlier in the week with nausea and poor p.o. intake.  Admitted from the [facility] office where she presented complaining of  ongoing nausea, poor appetite, and new development of diarrhea and fever  Labs in the ER earlier in the week showed a white count of 20,000,  creatinine of 1.9.  Blood and urine cultures were obtained in the ER, and  she was given Zofran and Phenergan.  She denies chest pain, cough, sputum  production, dysuria, hematuria, pain over her allograft, abdominal pain, leg  pain, or joint pain.   LABORATORY DATA AND OTHER STUDIES:  Labs on admission:  White count 17,200,  hemoglobin 9.9, platelets 169,000.  Sodium 132, potassium 3.6, chloride 103,  CO2 21, glucose 119, BUN 34, creatinine 2.0.  Calcium 9.3, albumin 2.7, LFTs  within normal limits.  Phosphorus 2.9.   HOSPITAL COURSE:  The patient was admitted, placed on IV fluids, IV  vancomycin and Cipro.  Blood cultures were obtained.  She has an abdominal  ultrasound which showed cholelithiasis without cholecystitis.  Common bile  duct measured 3.3 mm.  No focal lesions of liver or spleen, suboptimally  visualized.  Transplanted kidney in right lower quadrant measured 13 x 4.2 x  6.5 cm without hydronephrosis.  Color Dopplers showed blood flow into the  kidney.  Also seen was a right pleural effusion.  Plain chest x-ray showed  patchy bibasilar lung densities consistent with atelectasis and/or  pneumonia.  The patient remained afebrile throughout entire hospitalization.  Blood culture drawn in the emergency room prior to admission grew out Group  A strep.  Urine cultures returned growing Escherichia coli and Enterococcus.  She underwent various antibiotic changes.  Vancomycin and Cipro were  stopped, and she was placed  on clindamycin.  With continued diarrhea, it was  felt that clindamycin was not the best choice, and ultimately she was placed  on Avelox for antibiotic consolidation of which she was to receive 400 mg  daily for two weeks.  Infectious disease were consulted.  They recommended  this antibiotic therapy and also to have surveillance blood cultures at two  weeks and four weeks after completion of antibiotics.   With treatment of infection and IV fluids, her creatinine returned to  baseline at 1.2.  She was feeling well but was still having bouts of  diarrhea.  C. difficile was negative.  She was discharged home to use  Imodium.  Lasix and Norvasc were not restarted at time of discharge because  of ongoing diarrhea.  Her blood pressure was approximately 120/50 to 147/64.  She was instructed to  hold Lasix and Norvasc until her diarrhea had  resolved.   Other workup included a tacrolimus level which was therapeutic at 13.8 and  rechecked again on the same day at 9.4.  Blood cultures drawn this admission  were negative.  Stool cultures were all negative including for Giardia and  Cryptosporidium.  Cytomegalovirus by PCR was negative.  Epstein-Barr virus  was also negative.   A 2-D echocardiogram done showed left ventricular function to be normal with  ejection fraction of 55 to 65%.  There were no left ventricular regional  wall motion abnormalities.  There were no significant valvular findings.   DISCHARGE MEDICATIONS:  1.  Lopressor 100 mg b.i.d.  2.  Prograf 2.5 mg b.i.d.  3.  CellCept 500 mg b.i.d.  4.  Pepcid 20 mg daily.  5.  Enteric-coated aspirin 81 mg daily.  6.  Sensipar 30 mg daily.  7.  Reglan 5 mg b.i.d.  8.  Micardis 80 mg 1 a day.  9.  Lasix 80 mg daily, on hold.  10. Norvasc 10 mg daily, on hold.  11. Ferrous sulfate 325 mg on hold until Avelox is completed.  Then she is      to restart at b.i.d.  12. Surveillance blood cultures two weeks and four weeks post antibiotic      completion.  13. Imodium p.r.n. for diarrhea.      RRK/MEDQ  D:  09/05/2004  T:  09/06/2004  Job:  [id number]

## 2010-12-18 NOTE — H&P (Signed)
NAME:  [name], [name]                  ACCOUNT NO.:  [id number]   MEDICAL RECORD NO.:  [id number]          PATIENT TYPE:  INP   LOCATION:                               FACILITY:  [facility]   PHYSICIAN:  Aram Beecham B. Eliott Nine, M.D.DATE OF BIRTH:  08-20-1931   DATE OF ADMISSION:  08/11/2004  DATE OF DISCHARGE:                                HISTORY & PHYSICAL   REASON FOR ADMISSION:  Gram-positive bacteremia.   HISTORY:  [name] is a 77 year old black female who received a cadaveric  renal transplant in 2003 who was admitted for treatment of positive blood  cultures.  Her pertinent history is as follows:  She was seen in the  emergency department on August 09, 2004 with a complaint of 2 weeks of  nausea, 1 day of fevers as high as 101 and general malaise.  Her laboratory  studies were notable at that time for a white blood cell count of 20,000,  urinalysis was unremarkable.  Blood cultures were obtained and she was sent  home to be followed up in our office with prescriptions for Phenergan and  Reglan.   I saw her in the office on August 10, 2004 and at that time her exam was  notable only for a temperature of 99.4 but was otherwise unremarkable with  clear lungs, nontender abdomen and nontender renal allograft.  She was still  complaining of nausea at that time and also had developed the onset of  diarrhea without abdominal pain, melena or hematochezia.  Additional blood  and urine cultures were obtained and she was sent home with a container to  collect a stool for Clostridium difficile.  No antibiotics were given at  that time.   Laboratory obtained yesterday returned with a persistent elevation in white  blood count at 19,500, a urinalysis now showing 10-15 white blood cells with  moderate bacteria and blood cultures drawn in the emergency room on August 09, 2004 were positive for gram-positive cocci in chains.  She is thus  admitted for IV antibiotics and further evaluation for the  etiology of her  bacteremia.   PAST MEDICAL HISTORY:  1.  History of ESRD secondary to FSGS.  2.  Status post cadaveric renal transplant 2003 with baseline creatinine      1.5.  3.  Mild secondary hyperparathyroidism with mild hypercalcemia.  4.  Hypertension.  5.  History of coronary artery disease with prior stenting of the RCA.  6.  Prior history of TIA.  7.  Remote history of fungal sepsis related to a dialysis catheter.  8.  Remote history of Streptococcus viridans bacteremia 2002.   CURRENT MEDICATIONS:  1.  Iron twice a day.  2.  Lasix 80 mg q.a.m.  3.  Norvasc 10 mg a day.  4.  Prograf 2 mg 2 tabs b.i.d.  5.  CellCept 250 mg 2 tabs b.i.d.  6.  Pepcid 20 mg b.i.d.  7.  Metoprolol 50 mg 2 tabs b.i.d.  8.  Aspirin 81 mg a day.  9.  Micardis 80 mg a day.  10.  Reglan 5 mg b.i.d. started on August 09, 2004.  11. Phenergan 25 mg p.r.n. nausea and vomiting started August 09, 2004.  12. Sensipar 30 mg daily on hold since August 10, 2004 secondary to nausea.   FAMILY HISTORY:  Positive for hypertension.  She has 1 son who died of an  intercerebral hemorrhage.   SOCIAL HISTORY:  The patient lives in [place] with her grandson.  She  does not use alcohol or tobacco.   REVIEW OF SYSTEMS:  Positive for nausea, malaise, diffuse myalgias, watery  diarrhea, nausea.  She denies chest pain, cough, shortness of breath, sputum  production, skin rash, arthritis, arthralgias or urinary symptoms.   PHYSICAL EXAMINATION:  GENERAL:  She is an ill-appearing black female.  VITAL SIGNS:  Blood pressure 134/56, heart rate 102, temperature 99.4,  weight 174.  HEENT:  She is normocephalic.  No evidence for trauma.  There is a white  coating on her tongue.  Posterior pharynx is unremarkable.  NECK:  Supple without adenopathy.  LUNGS:  Grossly clear to auscultation.  CARDIAC:  S1 & S2, no S3.  ABDOMEN:  Nondistended.  Bowel sounds are present.  There is a well-healed  allograft scar in the  right lower quadrant.  The kidney itself is firm and  nontender.  There are no abdominal bruits and no organomegaly.  EXTREMITIES:  Show 1+ edema.   LABORATORY DATA:  From August 10, 2004 from [facility]:  Sodium 134, potassium  3.8, chloride 96, CO2 25, BUN 24, creatinine 1.9 glucose 140, bilirubin 1.4,  alkaline phosphatase 140.  SGOT and SGPT are within normal limits.  WBC  19,500 with 82% polys.  Urinalysis 2+ leukocyte esterase, 2+ occult blood,  10-15 white blood cells and moderate bacteria.  Blood and urine cultures  from [facility] obtained August 10, 2004 are pending.  Blood culture from [facility] obtained August 09, 2004 is growing gram-positive cocci in  chains with identification and sensitivity pending.   IMPRESSION:  A 77 year old black female renal transplant patient with:  1.  Recent fever, leukocytosis and blood cultures positive for gram-positive      cocci.  Her initial urine was benign but follow up urine on the August 11, 2003 suggest that this may be the source.  With nausea and diarrhea,      we also need to exclude other gastrointestinal sources including      gallbladder.  We will cover empirically with vancomycin and Cipro for      urinary and gastrointestinal and to cover the positive blood cultures.  2.  Diarrhea.  Question if this is secondary to her sepsis or if related to      some other pathology.  We will check stool for Clostridium difficile and      start Flagyl orally empirically.  3.  Renal transplant--creatinine is somewhat above her baseline of 1.5.  We      will check a Prograf level in the morning.  We will give her IV fluids      since she has had problems with nausea and vomiting, check a renal      ultrasound to rule out hydronephrosis or perinephric fluid collection      and hold her diuretics.  4.  Hypertension is controlled on meds.        ___________________________________________  [name], [name]   CBD/MEDQ  D:   08/11/2004  T:  08/11/2004  Job:  [id number]

## 2010-12-18 NOTE — Op Note (Signed)
[place]  Patient:    [name], [name]                         MRN: [id number] Proc. Date: 12/09/00 Adm. Date:  [id number] Attending:  Melvenia Needles                           Operative Report  PREOPERATIVE DIAGNOSIS:  End-stage renal failure.  POSTOPERATIVE DIAGNOSIS:  End-stage renal failure.  PROCEDURES: 1. Ultrasound localization, left internal jugular vein. 2. Insertion of left subclavian Quinton catheter.  SURGEON:  Denman George, [name]  ASSISTANT:  Nurse.  ANESTHESIA:  Local with MAC.  ANESTHESIOLOGIST:  Edwin Cap. Zoila Shutter, [name]  CLINICAL NOTE:  This is a 77 year old female end-stage renal failure patient with multiple previous access catheters for hemodialysis.  She underwent an attempt at right internal jugular Ash catheter insertion yesterday unsuccessfully.  Brought back to the operating room at this time for further attempt at access with dialysis catheter placement.  DESCRIPTION OF PROCEDURE:  Patient brought to the operating room in stable condition.  Placed in the supine position.  Left neck and chest prepped and draped in a sterile fashion.  Ultrasound localization of left internal jugular vein carried out.  The vein appeared to be quite small and thickened.  It was compressable.  Skin and subcutaneous tissue at the base of the left neck instilled with 1% Xylocaine.  Attempt was made to access the left internal jugular vein; however, this was unsuccessful.  Skin and subcutaneous tissue in the left chest was then instilled with 1% Xylocaine.  A needle was introduced in the left subclavian vein.  Guidewire passed with difficulty but could not be passed into the superior vena cava. Injection of contrast revealed marked tortuosity and stenosis of the left subclavian vein.  A 0.035 angled glidewire was then advanced into the superior vena cava.  A 5 French sheath was then advanced over the glidewire and  the dilator removed from the sheath.  A hand injection of Omnipaque contrast was then made, and this revealed the glidewire to be in the azygos vein.  The glidewire was then pulled back, a torquing device placed on the glidewire, and this was advanced into the right atrium.  Over the glidewire was passed a straight end-hole catheter and an exchange carried out for a super-stiff guidewire.  The end-hole catheter and sheath were then removed.  A subcutaneous tunnel created and a Quinton catheter placed through the tunnel. The guidewire track was then dilated up to 16 [place] size.  The Quinton catheter placed over the super-stiff guidewire and advanced into the superior vena cava and the guidewire removed.  Catheter flushed with heparin and saline solution and capped with heparin.  The catheter fixed to the skin with 2-0 silk suture.  The insertion site closed with interrupted 3-0 nylon.  Sterile dressings were applied.  Patient transferred to recovery room in stable condition. DD:  12/09/00 TD:  12/10/00 Job: [id number]

## 2010-12-18 NOTE — Discharge Summary (Signed)
[place]  Patient:    [name], [name] Visit Number: [id number] MRN: [id number]          Service Type: DSU Location: 5500 [phone] Attending Physician:  Alyson Locket Dictated by:   Maxwell Marion, RNFA Admit Date:  11/03/2001 Discharge Date: 11/04/2001                             Discharge Summary  DATE OF BIRTH: [birth year].  ADMISSION DIAGNOSIS: Lymphocele right thigh.  PAST MEDICAL HISTORY: End stage renal disease, currently on hemodialysis Monday, Wednes Dictated by:   Maxwell Marion, RNFA Attending Physician:  Alyson Locket DD:  11/04/01 TD:  11/06/01 Job: [id number]

## 2010-12-18 NOTE — Op Note (Signed)
. [facility]  Patient:    NYELLA, ECKELS Visit Number: [id number] MRN: [id number]          Service Type: DSU Location: 5500 [phone] Attending Physician:  Alyson Locket Dictated by:   Larina Earthly, [name] Proc. Date: 11/03/01 Admit Date:  11/03/2001 Discharge Date: 11/04/2001                             Operative Report  PREOPERATIVE DIAGNOSES:  End-stage renal disease with possibly infected large lymphocele in right femoral loop AV Gore-Tex graft.  POSTOPERATIVE DIAGNOSES:  End-stage renal disease, with possible infected large lymphocele in right femoral loop AV Gore-Tex graft.  PROCEDURES: 1. Revision of right femoral loop AV Gore-Tex graft; with replacement of    arterial limb lateral to existing lymphocele and the arterial limb    of the graft. 2. Evacuation of large lymphocele and removal of the prior distant arterial    limb of the right femoral AV Gore-Tex graft.  SURGEON:  Larina Earthly, [name]  ASSISTANT:  Loura Pardon, P.A.  ANESTHESIA:  General endotracheal.  COMPLICATIONS:  None.  To recovery room stable.  DESCRIPTION OF PROCEDURE:  The patient was taken to the operating room where later the right groin and right leg shaved and draped in usual sterile fashion.  An incision was made over the distal-most limb of the AV graft, where there was no evidence of inflammation or lymphocele.  The graft was encircled at this level and was not involved with lymphocele or infection.  A separate incision was then made in the groin, and carried down to isolate the graft -- which had a large lymphocele present around the arterial limb of the graft.  This lymphocele was evacuated and the graft was encircled near the arterial anastomosis.  Separate incision was made over the lateral thigh, and a new tunnel was created on the lateral aspect of the thigh, around the old existing graft away from the evidence of inflammation.  The 6 mm  Stanley-Wall graft was brought through the 6 mm stretch Stanley-Wall graft was brought through the tunnel. It was sewn end-to-end to the old graft near the arterial anastomosis, and then end-to-end to the old graft near the venous limb at the apex of the loop.  Clamps were removed and good flow was noted.  These wounds were then all closed with the 3-0 Vicryl in the subcutaneous and subcuticular tissue.  Next, incision was made over the fluctuant area of the lymphocele.  A very large lymphocele was evacuated.  This had some cloudy fluid present, but no obvious pus.  This was sent for culture and sensitivity.  Lymphocele was removed in its entirety, and the arterial limb of the graft which this was surrounding was removed in its entirety as well.  This lymphocele space was irrigated with antibiotic saline and separate stab incision was made.  The drain was brought away from the new graft and placed into the depth of the wound.  This was secured to the skin with a 3-0 nylon stitch and the incision area over the lymphocele was closed with a 3-0 Vicryl subcutaneous and subcuticular stitch.  Sterile dressing was applied. The patient was taken to the recovery room in stable condition. Dictated by:   Larina Earthly, [name] Attending Physician:  Alyson Locket DD:  11/03/01 TD:  11/04/01 Job: [id number]

## 2010-12-18 NOTE — Op Note (Signed)
NAME:  [name], [name]                            ACCOUNT NO.:  [id number]   MEDICAL RECORD NO.:  [id number]                   PATIENT TYPE:  AMB   LOCATION:  DSC                                  FACILITY:  [facility]   PHYSICIAN:  John L. Rendall III, [name]           DATE OF BIRTH:  1931-08-30   DATE OF PROCEDURE:  07/03/2003  DATE OF DISCHARGE:                                 OPERATIVE REPORT   PREOPERATIVE DIAGNOSIS:  Chronic carpal tunnel syndrome with tenosynovitis,  right wrist.   POSTOPERATIVE DIAGNOSIS:  Chronic carpal tunnel syndrome with tenosynovitis,  right wrist.   OPERATION PERFORMED:  Carpal tunnel release with flexor tenosynovectomy.   SURGEON:  John L. Rendall, [name]   ANESTHESIA:  Forearm Bier block.   PATHOLOGY:  The patient has a flattened erythematous median nerve, one of  the worst I have seen in the last couple of months with significant thinning  of nerve fibers deep beneath the volar carpal ligament.  The tenosynovium is  thickened and scarred from chronic inflammation.   INDICATIONS FOR PROCEDURE:  Chronic carpal tunnel syndrome with positive  nerve conduction velocities in a patient with renal transplant.   JUSTIFICATION FOR OUTPATIENT SETTING:  Inpatient not required.   DESCRIPTION OF PROCEDURE:  Under Bier block anesthesia, the hand was  prepared with DuraPrep and draped as a sterile field.  A thenar crease  incision was made curving ulnarward at the wrist flexion crease and then  radially parallel to the palmaris longus.  Dissection was done with 3.5  power magnification.  The flexor retinaculum was split proximally and  elevator was placed beneath the volar carpal ligament and dissection was  then carried from proximally distally preserving the palmaris longus  insertion on the radial side and splitting the entire length of the volar  carpal ligament.  Care was taken to avoid injury to any nerve branches.  Once the entire ligament had been split, the  skin edge proximally was  elevated and the flexor retinaculum was split an inch proximal to the wrist  flexion crease.  Once this was completed, the median nerve was examined.  It  was flattened and erythematous.  Displaced more to the radial side than  usual.  The tenosynovium was found to be thickened and scarred.  The flexor  tenosynovectomy was carried out of the sublimi and to a lesser extent, the  profundi and the FPL.  Specimen was sent to the lab for evaluation.  Once  this was completed, multiple small vessels were cauterized.  The wrist  flexion crease was reapproximated with 3-0 Vicryl.  Skin was closed with 4-0  nylon.  Sterile dressing and volar plaster splint were applied after  infiltrating the wound with Marcaine 0.25% plain with 4 mg morphine.  The  patient returned to recovery in good condition.  John L. Dorothyann Gibbs, [name]    Renato Gails  D:  07/03/2003  T:  07/03/2003  Job:  [id number]

## 2010-12-18 NOTE — H&P (Signed)
[place]. [facility]  Patient:    [name], [name] Visit Number: [id number] MRN: [id number]          Service Type: MED Location: [id number] Attending Physician:  Garnetta Buddy Dictated by:   Garnetta Buddy, [name] Admit Date:  04/19/2001                           History and Physical  CHIEF COMPLAINT: This is a 77 year old African-American female, with end-stage renal disease, hypertension, and focal segmental glomerulosclerosis, admitted with fever and chills on dialysis.  HISTORY OF PRESENT ILLNESS: She received Ancef and tobramycin.  She has a history of failed AV grafts in her upper extremities secondary to thrombosis and multiple catheter infections including Candida, Alcaligenes, Enterococcus, and most recently MSSA.  She received a right thigh graft, performed on February 26, 2001, by Dr. Elyn Peers and is status post removal of a right internal jugular catheter.  She complains of pain and right-sided thigh swelling after placement of the graft.  PAST MEDICAL HISTORY:  1. FSGS.  2. Hypertension.  3. Vascular access thrombosis.  4. Catheter infections.  5. Anemia.  6. Secondary hyperparathyroidism.  7. History of partial hysterectomy.  8. Coronary artery disease, status post catheterization with stent to     right coronary artery.  MEDICATIONS:  1. Norvasc 10 mg q.d.  2. Nephro-Vite one q.d.  3. Calcium carbonate with meals.  4. Restoril 30 mg q.h.s.  5. Percocet 1-2 q.4h to q.6h p.r.n.  6. Quinine 260 mg q.dialysis p.r.n.  7. Protonix 40 mg q.d.  SOCIAL HISTORY: She lives with a grandson.  She worked at [employer]. Negative for tobacco or alcohol.  FAMILY HISTORY: She had a son die who was hemodialysis dependent with an intracranial bleed.  Family history pertinent for hypertension.  REVIEW OF SYSTEMS: Complains of no chest pain, orthopnea, PND, palpitations, blackout spells, or ankle swelling.  She denies cough, wheeze, shortness  of breath, or hemoptysis.  There is nausea but no vomiting.  No diarrheal stools. She as carpal tunnel syndrome with paresthesias in the hands and is scheduled for surgery.  Otherwise, negative except for feeling generally unwell.  PHYSICAL EXAMINATION:  GENERAL: She is an ill-appearing African-American female in no obvious distress.  She is obese.  VITAL SIGNS: Blood pressure 130/80, pulse 80, temperature 101.5 degrees.  HEENT: Normocephalic, atraumatic.  PERRL.  EOMI.  Ears, nose, mouth, throat, external pharynx normal.  Oropharynx clear.  CARDIOVASCULAR: Faint systolic murmur, left sternal edge, with sustained displaced apex beat.  RESPIRATORY: Lung fields clear to auscultation.  She had good air entry bilaterally.  ABDOMEN: Soft, obese, nontender, nondistended.  Positive bowel sounds.  No bruits.  NEUROLOGIC: Cranial nerves 2-12 intact.  Reflexes downgoing and symmetric.  No sensory deficit.  SKIN: Without any lesions.  She had some warmth and swelling located over the right thigh.  EXTREMITIES: Edema over the thigh with some tenderness and adenopathy in the right groin.  Lower extremities without note.  LABORATORY DATA: Sodium 136, potassium 3.3, chloride 94, CO2 30, BUN 16, creatinine 5.5, glucose 150.  Albumin 3.1, AST 22, ALT 8, alkaline phosphatase 103, calcium 8.8, bilirubin 0.5.  WBC 15.3, hemoglobin 9, hematocrit 27, platelets 268,000.  Chest x-ray, left pleural effusion; otherwise clear.  EKG, normal sinus rhythm.  ASSESSMENT/PLAN:  1. Graft infection.  Obtain blood cultures.  Continue tobramycin and Ancef.     Ultrasound graft.  Consult  CV/TS.  2. End-stage renal disease.  Continue hemodialysis, binders, Epogen, and     Calcijex with dialysis.  3. Anemia.  Continue Epogen 7000 units q.treatment.  4. Secondary hyperparathyroidism.  Continue Calcijex 1.5 mcg q.treatment.  5. Hypertension, appears adequately controlled on Norvasc 10 mg q.d.  6. Coronary  artery disease.  Continue current medications.  Control blood     pressure and anemia and volume. Dictated by:   Garnetta Buddy, [name] Attending Physician:  Garnetta Buddy DD:  04/19/01 TD:  04/20/01 Job: [id number]

## 2010-12-18 NOTE — Cardiovascular Report (Signed)
[place]. [facility]  Patient:    [name], [name]                         MRN: [id number] Proc. Date: 02/22/01 Adm. Date:  [id number] Attending:  Caralee Ates CC:         [name], [name]  Delrae Rend, [name]  CP Lab   Cardiac Catheterization  PROCEDURES:  Retrograde central aortic catheterization, selective right coronary artery angiography pre and post icy nitroglycerin administration, Aggrastat bolus plus infusion, weight-adjusted heparin 3600 units, direct stenting of high-grade mid right coronary artery stenosis.  CARDIOLOGIST:  Richard A. Alanda Amass, [name]  BRIEF HISTORY:  Mrs. Michelin is a very pleasant 77 year old black married mother of four children and six grandchildren.  She has ESRD on chronic hemodialysis with a history of systemic hypertension, remote smoking.  She has had dialysis access problems with thrombosed grafts and venous stenosis in the past and recently underwent Gore-Tex right groin A-V fistula by Dr. Liliane Bade for dialysis access (right thigh Gore-Tex).  Postoperatively, she developed chest pain relieved with nitroglycerin and compatible with ischemia without myocardial infarction.  Diagnostic catheterization was performed through the left femoral artery approach on February 20, 2001, and revealed a high-grade eccentric 85% mid right coronary stenosis beyond the acute marginal branch and otherwise fairly normal artery.  There was double ostia to the LAD which had 50% segmental calcific narrowing in the proximal third past the diagonal, 50 to 60% diagonal narrowing, and another 50% narrowing beyond the second septal perforator at the junction of the proximal and mid third of the LAD with calcification.  There was good TIMI-3 flow.  EF was essentially normal with no gradient across the aortic valve.  It was elected to proceed with percutaneous coronary intervention of her right coronary stenosis.  It was difficulty to tell where the  culprit lesion was, but the right coronary stenosis was clearly more severe.  Informed consent was obtained from the patient and her husband.  DESCRIPTION OF PROCEDURE:  The patient was brought to the sixth floor peripheral lab in a postabsorptive state after 5 mg of Valium p.o. premedication.  The left groin was prepped and draped in the usual manner. Xylocaine 1% was used for local anesthesia, and the patient was given 2 mg of Nubain for sedation>  The right femoral artery was entered with single anterior puncture using an 18 thin-wall needle, and a 6-French short Cordis side-arm sheath was inserted without difficulty.  Selective right coronary angiogram was performed with A Sci-Med 6-French JR4 sidehole guiding catheter.  ROA, LAO, and left lateral projections were utilized to avoid compromising the large right ventricular branch with the lesion beginning several millimeters beyond this.  A Hi-Torque 0.014 inch guidewire was used to traverse the vessel and lesion and freeing the distal vessel.  There was only minimal damping of the sidehole guiding catheter.  Arterial pressures were monitored throughout the procedure, and approximately 155/71 mmHg with the patient in sinus rhythm at a rate of 87. She had been premedicated with aspirin and Plavix and given 3600 units of weight-adjusted heparin in the lab, and Aggrastat bolus plus infusion was begun, monitoring ACT throughout the procedure.  The lesion was stented directly with a 3.0/13 heparin-coated Bx Velocity stent which was positioned just beyond the RV branch.  It was deployed at 10 atmospheres for 45 seconds and redilated at 12 atmospheres for 52 seconds.  The balloon was removed.  Final injections post icy nitroglycerin administration revealed excellent angiographic result with stenosis reduction of 85% to -10%.  There was no compromise of the RV side branch.  There was mild 20% narrowing of the RCA just proximal to the RV  branch, and the remainder of the vessel was normal.  There was TIMI-3 flow.  Dilatation system was removed.  Sidearm sheath was flushed.  The patient was brought to the holding area for ACT measurement and subsequent sheath removal.  We plan to continue her Aggrastat for approximately 12 hours, continue aspirin indefinitely and Plavix for four weeks.  RECOMMENDATIONS:  Continued medical therapy of her left coronary disease.  I think outpatient Cardiolite may be helpful in assessing any hemodynamic significance to these, particularly if there is recurrent angina.  CATHETERIZATION DIAGNOSES: 1. Atherosclerotic heart disease, unstable angina pectoris. 2. High-grade mid right coronary artery stenosis treated with direct    coronary stenting, successful, February 23, 2001, 3.0/13 Bx Velocity    heparin-coated stent. 3. Well preserved left ventricular function, left ventricular hypertrophy. 4. Hypertensive heart disease, left ventricular hypertrophy. 5. End-stage renal disease. 6. Past cigarette abuse. 7. Chronic hemodialysis currently by right subclavian Quinton catheter.    New right thigh atrioventricular Gore-Tex graft implanted February 16, 2001. DD:  02/22/01 TD:  02/22/01 Job: [id number]

## 2010-12-18 NOTE — H&P (Signed)
[place]. [facility]  Patient:    [name], [name]                           MRN: [id number] Adm. Date:  12/16/00 Attending:  Duke Salvia. Eliott Nine, [name] Dictator:   Karsten Ro, P.A. CC:         [facility]   History and Physical  DATE OF BIRTH: 03/07/1932  CHIEF COMPLAINT: [name] is a 77 year old black female with end-stage renal disease, on hemodialysis Monday, Wednesday, and Friday at [facility].  This morning after being put on a dialyzer she experienced slurred speech and numbness that began at her chest and extended down to her feet.  HISTORY OF PRESENT ILLNESS: She felt as if she lost control of her legs and they were "jumping up and down."  The episode lasted approximately 15 minutes, after which she complained of feeling bad all over.  She has had a recent Candida sepsis and continues to take Diflucan.  Her right IJ catheter was removed on Dec 05, 2000 and replaced Friday, Dec 09, 2000 (left IJ).  Replacement of the catheter was scheduled for Dec 08, 2000, but apparently she had an allergic reaction to Ancef given presurgery.  She remained in the hospital from Dec 08, 2000 to Dec 10, 2000.  PAST MEDICAL/SURGICAL HISTORY:  1. End-stage renal disease secondary to FSG (first hemodialysis March 1999).  2. Allergic rhinitis.  3. Status post partial hysterectomy.  4. Hypertension.  5. Anemia.  6. History of yeast sepsis January 20, 2000 to February 01, 2000; Alcaligenes     xylosoxidans.  SOCIAL HISTORY: Former Science writer, now unemployed.  Negative for ETOH, negative for tobacco, negative for illicit drug use.  CURRENT MEDICATIONS:  1. Diflucan 200 mg p.o. q.d.  2. Norvasc 10 mg q.h.s.  3. Coumadin 2 mg p.o. q.d.  4. Restoril 30 mg p.o. q.h.s. p.r.n.  5. Calcium 500 mg three a.c. t.i.d.  6. Pepcid 20 mg one p.o. q.h.s.  ALLERGIES:  1. SULFA.  2. HEPARIN.  REVIEW OF SYSTEMS: Positive for nausea, positive for  poor appetite, positive for weakness.  Negative for shortness of breath, negative for chest pain, negative for headache.  All other systems negative.  PHYSICAL EXAMINATION:  VITAL SIGNS: Temperature 97.4 degrees, blood pressure 149/82, pulse 95.  HEENT: PERRLA.  EOMI.  Head normocephalic, atraumatic.  NECK: No masses, no lymphadenopathy.  CHEST: Sutures in place in right upper chest where catheter removed.  Scars from previous catheters in right upper chest.  Left IJ catheter in place, some ecchymosis and edema in this area.  HEART: Regular rate and rhythm without murmurs, rubs, or gallops.  LUNGS: Clear to auscultation bilaterally after hemodialysis.  ABDOMEN: Soft, nontender.  Bowel sounds present in all four quadrants.  EXTREMITIES: Trace edema of feet and ankles.  NEUROLOGIC: Cranial nerves 2-12 grossly intact.  Strength 4+ in all extremities.  Sensation equal bilaterally.  LABORATORY DATA: Hemoglobin 9.4, down from 10.4 on Nov 30, 2000.  ASSESSMENT/PLAN:  1. Admit to 5502, bed two.  2. Transient ischemic attack versus seizure activity.  MRI of brain and     EEG.  3. Candida sepsis.  Obtain 2D echocardiogram to rule out vegetation.  4. End-stage renal disease.  Monday, Wednesday, and Friday dialysis at     [facility].  Completion of hemodialysis today.  Prescription is 3.75 hours,     2K, DFR 500,  BFR 450, EDW 74 kg, FO 8500 units q.treatment, Calcijex     0.5 mcg q.treatment.  DD:  12/16/00 TD:  12/17/00 Job: [id number]

## 2010-12-18 NOTE — Discharge Summary (Signed)
[place]. [facility]  Patient:    [name], [name]                         MRN: [id number] Adm. Date:  [id number] Disc. Date: [id number] Attending:  Lurlean Nanny Dictator:   Karsten Ro, P.A. CC:         [facility]   Discharge Summary  ADMISSION DIAGNOSES: 1. Transient ischemic attack versus seizure. 2. Candida sepsis. 3. End-stage renal disease.  DISCHARGE DIAGNOSES: 1. Transient ischemic attack. 2. Hypertension. 3. Candida sepsis. 4. End-stage renal disease.  PROCEDURES: 1. Electroencephalogram negative for seizure-like activity. 2. 2D echocardiogram on Dec 14, 2000, with left ventricular systolic function    normal, ejection fraction 55-65%, increased aortic valve thickness.    Procedure was technically difficulty.  No vegetation was identified, but    could not be excluded. 3. Magnetic resonance imaging of the brain revealed minimal atrophy and small    vessel disease.  Negative for acute infarct.  CONSULTATIONS:  Urology, Dr. Kelli Hope.  HISTORY OF PRESENT ILLNESS:  [name] is a 77 year old, African-American female with end-stage renal disease on hemodialysis on Monday, Wednesday and Friday at [facility].  On the morning of Dec 14, 2000, after being put on the hemodialyzer, she experienced slurred speech and numbness that began at her chest and extended down to her legs and feet.  She felt as if she lost control of her legs and they were jumping up and down.  The episode lasted approximately 15 minutes after which she complained of feeling bad all over.  [name] has had a recent Candida sepsis and continues to take Diflucan. Her right IJ catheter was removed on Dec 05, 2000, and replaced Friday, Dec 09, 2000, in her left IJ.  Replacement of catheter was scheduled for May 9, but apparently she had an allergic reaction to antibiotics given presurgery.  She remained in the hospital from Dec 08, 2000 to Dec 10, 2000.  Physical exam was essentially negative.  The neurologic portion revealed her cranial nerves II-XII intact.  Strength was 4+ in all extremities and her sensation was equal bilaterally.  Hemoglobin was 9.4 which was decreased by 1 g from hemoglobin results from Nov 30, 2000.  HOSPITAL COURSE:  #1 - TRANSIENT ISCHEMIC ATTACK:  [name] did not have any further slurred speech or similar symptoms to her episode at admission.  She did complain of having a headache.  Dr. Kelli Hope with neurology consulted.  With reviewing the EEG and MRI of the brain, he did not think she had a seizure and there was no evidence on imaging that she had a stroke.  He recommended starting antiplatelet therapy such as aspirin at 81 or 325 mg. The MRA of the brain rule out any vertebral basilar lesions.  He did note that she had some symptoms of depression and a low-dose antidepressant was started.  #2 - HYPERTENSION:  Hypertension was controlled by hemodialysis as well as Norvasc.  It was noted that she did have some orthostasis during this hospitalization.  Her new dose of Norvasc was to be at 5 mg per day.  #3 - CANDIDA SEPSIS:  Diflucan was to be continued until January 02, 2001.  She did have some left upper extremity edema which was probably from her hemodialysis catheter that was placed previous to this admission.  This was to evaluated when Ms. Mamula returned to hemodialysis by the physician  assistance or medical doctor rounding.  #4 - END-STAGE RENAL DISEASE:  [name] had dialysis once during the hospitalization on May 17, which she tolerated very well.  She was to return outpatient to her regular schedule at [facility] on Monday, Wednesday and Friday.  DISPOSITION:  Discharged to home with family.  DISCHARGE MEDICATIONS:  1. Diflucan 200 mg q.d. until January 02, 2001.  2. Norvasc 5 mg p.o. q.d., prescription given with five refills.  3. Os-Cal 500 mg three q.a.c. t.i.d.   4. Protonix 40 mg one q.h.s., prescription given with three refills.  5. Nephro-Vite one p.o. q.d.  6. Hydroxyzine 25 mg one q.6h. p.r.n. itching.  7. Aspirin 325 mg p.o. q.d.  8. Celexa 20 mg one p.o. q.d., prescription with two refills given.  9. Restoril 30 mg one q.h.s. p.r.n. sleep. 10. Darvocet-N 100 one q.6h. p.r.n. pain. 11. Coumadin 2 mg one p.o. q.d. 12. Quinine sulfate 325 mg one q.h.s. and one every third hemodialysis  ACTIVITY:  As tolerated.  DIET:  Renal diet of 80/2/2.  SPECIAL INSTRUCTIONS:  Hemodialysis instructions with Epogen 85 units with dialysis IV.  Calcijex 0.5 mcg every hemodialysis treatment. DD:  01/07/01 TD:  01/09/01 Job: [id number]

## 2010-12-18 NOTE — Op Note (Signed)
[place]. [facility]  Patient:    [name], [name]                         MRN: [id number] Proc. Date: 12/27/00 Adm. Date:  [id number] Attending:  Lurlean Nanny CC:         [facility]   Operative Report  PROCEDURE:  Zachary George twin cath insertion.  INDICATIONS:  Access for hemodialysis.  The procedure was explained to the patient and both benefits and risks were understood and accepted.  SURGEON:  James L. Deterding, [name]  DESCRIPTION OF PROCEDURE:  The patient was taken to the fluoroscopy suite and placed on the fluoroscopy table in the supine position with a towel roll between her shoulders.  Initially, internal jugular vein was located in the middle 1/3 of the sternocleidomastoid triangle.  It was noted to be a very small blood vessel secondary to multiple old catheters.  Much scar tissue was also noted there.  Subsequently the right side of the neck and right upper chest were prepped with Betadine.  2% Xylocaine local anesthesia was used in the sternocleidomastoid triangle and in the inferolateral fashion approximately 8 x 4 cm.  Using sterile ultrasound guidance, internal jugular vein was cannulated.  A J-tip guide wire advanced over the 18 gauge thin wall needle into the internal jugular vein.  It was noted that there was a severe lateral deviation to the wire, but it was able to get to the superior vena cava.  I could not get beyond that initially.  A 10 French dilator was then placed over the guide wire, advanced into the internal jugular vein, past the deviation into the superior vena cava.  The wire was manipulated into the inferior vena cava.  Subsequently serial dilators were placed over the guide wire starting with 10 [place] and then going through 12, 14, 16, and 18 [place]. This was done with great difficulty and the wire had to be replaced with a stiff Amplatz guide wire.  This due to multiple stenoses found in  the superior vena cava and inominant on the right side.  Subsequent 18 French trocar with tear away sheath was placed over the guide wire into the internal jugular vein, inominant, and superior vena cava under fluoroscopic guidance.  A nonserrated dialysis clamp was placed around the trocar and sheath just outside the skin.  Both lumens of a double lumen Schon twincath were flushed with saline and friction screw and tunneling device was placed on the distal ends.  The trocar was then withdrawn from the sheath.  The sheath was clamped with a dialysis clamp.  The catheter was advanced through the remainder of the sheath into the internal jugular vein, inominant, and superior vena cava with the tip coming to rest at the superior vena cava atrial junction after some manipulation.  Initially, there was severe lateral deviation of the tips and guide wires were placed through the lumens after the tunneling devices were removed and the catheter was advanced so that they were as straight as possible.  There were still several curves in the catheter due to multiple stenoses in the superior vena cava.  The wires were then removed and screw-in tunneling device was placed on the ends.  The tunneling devices were bent in 180 degree octave to facilitate tunnel formation.  Two tunnels were performed initially in the superior direction and then lateral inferior with tips coming out 3 cm inferior  to the clavicle 2 cm apart, approximately 7 cm inferior to the vein entry.  Catheters were clamped, cut off, and friction screw-on conducting devices applied.  The catheters were flushed with saline and then concentrated heparin and heparin locked.  The skin over the vein entry was closed with 3-0 silk and pursestring sutures taken 1 cm from the tunnel exit site with 3-0 silk also.  Hypafix pressure dressing was applied. DD:  12/27/00 TD:  12/27/00 Job: [id number]

## 2010-12-18 NOTE — Discharge Summary (Signed)
[place]. [facility]  Patient:    [name], [name] Visit Number: [id number] MRN: [id number]          Service Type: DSU Location: 5500 [phone] Attending Physician:  Alyson Locket Dictated by:   Loura Pardon, P.A. Admit Date:  11/03/2001 Discharge Date: 11/04/2001                             Discharge Summary  DATE OF BIRTH: 07/09/1932.  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  SECONDARY DIAGNOSES: 1. End stage renal disease. 2. Hypertension. 3. History of anemia. 4. History of secondary parathyroidism. 5. History of partial hysterectomy. 6. History of atherosclerotic coronary artery disease.  PROCEDURES: 1. On November 03, 2001 evacuation of lymphoceles X2. 2. Culture of lymphocele right thigh. 3. Placement of arterial limb of arteriovenous Gore-Tex graft with 6 mm    Gore-Tex conduit and removal of existing arterial limb of the    arteriovenous Gore-Tex  graft. 4. The patient to continue on hemodialysis with a functioning graft in the    right thigh. Her hemodialysis is Monday, Wednesday, and Friday at [facility]. 5. Jackson-Pratt drain was placed intraoperatively.  HISTORY OF PRESENT ILLNESS: [name] is a 77 year old female with end stage renal failure. She was admitted to [facility] on July 29, 2001 for a thrombectomy of the right femoral arteriovenous Gore-Tex graft. This was performed by Dr. Denman George. It was noted at that time that the patient had extensive lymphoceles in the right groin and right thigh.  In the interval, the lymphocele in the right thick thigh has become erythematous, warm, and tender with a small pustule tract leading from it at the distal edge of the lymphocele swelling. She is admitted to [facility] -  November 03, 2001 for drainage  and evacuation of the lymphoceles, culture of the lymphoceles and revision of the arteriovenous Gore-Tex graft with removal of existing arterial limb placement of a new arterial limb.  DISCHARGE DISPOSITION: [name] is ready for discharge on postop day #1. She has been afebrile during the postoperatively period. Her mental status has remained clear. Her incisions are without drainage, swelling, or erythema.  JP drain showed minimal output and was removed on postop day #1. The pain is controlled with oral analgesia. She did receive hemodialysis on Friday, November 03, 2001 at [facility]. Her potassium was 4.6 prior to the surgery. She has been maintained in the postoperative period on IV Zosyn.  DISCHARGE MEDICATIONS: She goes home on her usual medications as well as Percocet 5/325 1-2 tabs po q.4-6h. p.r.n. for pain.  ACTIVITY: She may walk to keep up her strength.  DIET: Renal diet.  WOUND CARE: She may shower beginning Monday, November 06, 2001. She is asked to sponge bathe until then. Dictated by:   Loura Pardon, P.A. Attending Physician:  Alyson Locket DD:  11/03/01 TD:  11/04/01 Job: [id number]

## 2010-12-18 NOTE — Op Note (Signed)
[place]. [facility]  Patient:    [name], [name]                         MRN: [id number] Proc. Date: 01/26/00 Adm. Date:  [id number] Attending:  Toma Copier CC:         Norton Blizzard, [name] x 2                           Operative Report  PREOPERATIVE DIAGNOSIS:  End-stage renal disease.  POSTOPERATIVE DIAGNOSIS:  End-stage renal disease.  OPERATION PERFORMED:  Insertion of right IJ Ash catheter.  SURGEON:  D. Karle Plumber, [name]  FIRST ASSISTANT:  None.  ANESTHESIA:  Local anesthesia with IV sedation.  DESCRIPTION OF PROCEDURE:  After prepping and draping the right neck and shoulder, the area was infiltrated with 1% Xylocaine and an ultrasound directed right IJ puncture was performed.  Prior to prepping and using the ultrasound scanner, the right subclavian, left subclavian, the right IJ, and the left IJ were scanned, and the right IJ was small, but opened.  There was a patent right subclavian and left subclavian, and an occluded left IJ.  We used an ultrasound scan.  After the right IJ had been entered, a stab wound was made around the guidewire, and the guidewire was confirmed by fluoroscopy to be down through the heart into the inferior vena cava.  Then, the area was infiltrated with Xylocaine laterally, stab wound made, and an 8 cm Ash catheter was turned laterally to medially.  Over the Ash catheter, a dilator was then placed under fluoroscopy up to a 16 dilator with a peel-away sheath. The Ash catheter, the dilator, and the guidewire were removed.  The sheath was clamped and through the sheath was passed the Ash catheter without any difficulty to the right atrium.  It was flushed with heparin and the Ash catheter was sutured in place with 3-0 nylon at the medial portion, and also at the exit site, and also with a suture tie.  A dry sterile dressing was applied.  The patient was returned to the recovery room in stable condition. DD:   01/26/00 TD:  01/27/00 Job: [id number]

## 2010-12-18 NOTE — Op Note (Signed)
NAMEKaitrin, [name] NO.:  [id number]   MEDICAL RECORD NO.:  [id number]                   PATIENT TYPE:  OIB   LOCATION:                                       FACILITY:  [facility]   PHYSICIAN:  Charlynne Pander, D.D.S.          DATE OF BIRTH:  03-Oct-1931   DATE OF PROCEDURE:  10/26/2002  DATE OF DISCHARGE:                                 OPERATIVE REPORT   PREOPERATIVE DIAGNOSES:  1. End-stage renal disease.  2. Status post kidney transplant surgery.  3. Acute pulpitis.  4. Chronic apical periodontitis.  5. Accretions.  6. Chronic periodontal disease.  7. Dental caries.   POSTOPERATIVE DIAGNOSES:  1. End-stage renal disease.  2. Status post kidney transplant surgery.  3. Acute pulpitis.  4. Chronic apical periodontitis.  5. Accretions.  6. Chronic periodontal disease.  7. Dental caries.   PROCEDURES:  1. Dental examination.  2. Extraction of tooth numbers 2, 4, 20, 23, 24, 25, 26 and 29.  3. Alveoplasty of the lower left and lower right quadrants.  4. Gross debridement of the remaining dentition.   SURGEON:  Charlynne Pander, D.D.S.   ASSISTANT:  [employer] Event organiser).   ANESTHESIA:  1. Monitored anesthesia care per the anesthesia team.   MEDICATIONS:  1. Clindamycin 600 mg IV prior to invasive dental procedures.  2. Local anesthesia with a total utilization of five Carpule each containing     36 mg of Xylocaine with 0.018 mg of epinephrine.   SPECIMENS:  There were eight teeth which were discarded.   CULTURES:  None.   FLUIDS REPLACED:  600 ml of lactated Ringer's solution.   ESTIMATED BLOOD LOSS:  Less than 50 ml.   COMPLICATIONS:  None.   INDICATIONS FOR PROCEDURE:  The patient has a history of end-stage renal  disease and is status post renal transplant in November 2003.  The patient  was referred for evaluation of her dentition to rule out dental infection  which may affect the patient's systemic health and  previous kidney  transplant surgery.  The patient was examined and treatment planned for  extraction of tooth numbers 2, 4, 20, 23, 24, 25, 26 and 29 with alveoplasty  as indicated along with gross debridement of the remaining dentition.  This  treatment plan was formulated to decrease the risk and complications  associated with dental infection for maximizing the patient's systemic  health and previous kidney transplant surgery as well as to assist in  possible rehabilitation for the patient.   OPERATIVE FINDINGS:  The patient was examined in operating room #5.  The  teeth were identified for extraction.  The patient was noted to be affected  by significant accumulation of accretions, chronic periodontal disease,  apical periodontitis and malocclusion.  The patient also had a history of  acute pulpitis symptoms.   DESCRIPTION OF PROCEDURE:  The patient was  brought to main operating room  #5.  The patient was placed in the supine position on the operating room  table.  Monitored anesthesia care was induced by the anesthesia team.  The  patient was then prepped and draped in the usual manner for a dental  medicine procedure.  The oral cavity was clearly examined with the findings  as noted above.  The patient was then ready for the oral surgical procedure  as follows:   Local anesthesia was administered at the initiation of the procedure with a  total utilization of five Carpule each containing 36 mg of Xylocaine with  0.018 mg of epinephrine.   The maxillary right quadrant was first approached.  Tooth numbers 2 and 4  were subluxated with a series of straight elevators.  Tooth #2 was then  removed with a 53R forceps without complications.  Tooth #4 was then removed  with 150 forceps without complications.  The sockets were then curetted and  compressed appropriately.  It was determined that alveoplasty would not need  to be performed at this time.  The surgical site was then irrigated  with  copious amounts of sterile saline.  The extraction site #2 was then closed  utilizing a figure-of-eight suture technique and a 3-0 chromic and suture  material.  No suture was used to close the surgical site #4.   The mandibular left quadrant was then approached. A 15 blade incision was  made from the distal of #19 to the mesial of #20.  The surgical flap was  then reflected.  Tooth #20 was then subluxated with a series of straight  elevators and then removed with the 151 forceps without complications.  Alveoplasty was then performed utilizing rongeurs and bone file.  The  tissues were then approximated and trimmed appropriately.  The surgical site  was then irrigated with copious amounts of sterile saline.  The surgical  site was then closed utilizing 3-0 chromic and suture material in a  continuous interrupted suture technique on the distal of #19 through the  mesial of #20.   The manubrial anterior teeth were then approached.  These teeth were  subluxated with a series of straight elevators.  These teeth were then  removed with the 151 forceps without complications.  Alveoplasty was then  performed utilizing rongeurs and bone file.  The surgical site was then  irrigated with copious amounts of sterile saline.  The tissues were then  approximated and trimmed appropriately.  The surgical site was then closed  utilizing 2-0 chromic and suture material in a continuous interrupted suture  technique from the mesial of #22 through the mesial of #27.  Prior to the  closure again, the surgical site was irrigated with copious amounts of  sterile saline.   The mandibular right quadrant was then approached.  A 15 blade incision was  made from the distal of #30 through the mesial of #29.  The surgical flap  was then reflected.  Tooth #29 with subluxated with a series of straight  elevators. Alveoplasty was then performed using rongeurs and bone file.  The tissues were approximated and  trimmed appropriately.  The surgical site was  then irrigated with copious amounts of sterile saline.  The surgical site  was then closed from the distal of #30 through the mesial of #29 utilizing 3-  0 chromic and suture material in a continuous interrupted suture technique  times one.   At this point in time the remaining dentition was  approached.  A Kavo Sonic  scaler was then utilized the significant accretions present in the mouth for  the past 15 years.  A series of hand curettes were then utilized to remove  further accretions.  The Kavo Sonic scaler was then again utilized to remove  further accretions.  The entire mouth was then irrigated with copious  amounts of sterile saline.  At this point in time the patient was examined  for complications and seeing none, the dental medicine procedure was deemed  to be complete.  The patient was then handed over to the anesthesia team for  final disposition. After an appropriate amount of time, the patient was  taken to the post-anesthesia care unit with stable vital signs and good  oxygenation level.  All counts were correct for the dental medicine  procedure.                                               Charlynne Pander, D.D.S.    RFK/MEDQ  D:  10/26/2002  T:  10/27/2002  Job:  [id number]   cc:   Dyke Maes, [name]  [address]  [place]  [place] [address]  Fax: [phone]

## 2010-12-18 NOTE — Discharge Summary (Signed)
[place]. [facility]  Patient:    [name], JENSEN Visit Number: [id number] MRN: [id number]          Service Type: DSU Location: [facility] [id number] Attending Physician:  Caralee Ates Dictated by:   Areta Haber, Rochel Brome. Admit Date:  08/28/2001 Discharge Date: 08/28/2001   CC:         Garnetta Buddy, [name]   Discharge Summary  HISTORY OF PRESENT ILLNESS:  This is a 77 year old female patient with end-stage renal failure with a clotted right thigh AV Gore-Tex graft that was admitted for thrombectomy.  HOSPITAL COURSE:  The patient was admitted and taken to the operating room on 07/29/01 for a simple thrombectomy of the right leg AV Gore-Tex graft by Dr. Denman George.  The patient tolerated the procedure well and was taken to the postanesthesia care unit in stable condition.  It was noted that the patient had extensive lymphoceles of the right groin and was not able to revised.  The patient did have a good graft bruit/thrill following the procedure.  POSTOPERATIVE HOSPITAL COURSE:  The graft remained patent.  The patient did develop increased bleeding from the groin wound when up out of bed on the morning of December 29 per the nursing staff.  The patient was evaluated and was able to stand and walk with no further bleeding and was felt to be stable on that day for discharge.  FINAL DIAGNOSIS: End-stage renal disease status post simple thrombectomy.  OTHER DIAGNOSES: 1. Hypertension. 2. History of anemia. 3. History of secondary hyperparathyroidism. 4. History of partial hysterectomy. 5. History of coronary artery disease. 6. Hyperkalemia with a potassium of 6.1 on July 29, 2001. Dictated by:   Areta Haber, Rochel Brome. Attending Physician:  Caralee Ates. DD:  10/11/01 TD:  10/13/01 Job: [id number]

## 2010-12-18 NOTE — Discharge Summary (Signed)
[place]. [facility]  Patient:    [name], [name] Visit Number: [id number] MRN: [id number]          Service Type: MED Location: [phone] Attending Physician:  Garnetta Buddy Dictated by:   Melba Coon, [name] Admit Date:  04/19/2001 Disc. Date: 04/24/01   CC:         [facility]  [facility], [facility] [address]   Discharge Summary  DISCHARGE DIAGNOSES: 1. Exploration and excision of right arteriovenous graft lymphocele. 2. Strep virudans positive blood culture. 3. End-stage renal disease. 4. Anemia. 5. Hypertension. 6. Secondary hypoparathyroidism. 7. Coronary artery disease.  MEDICATIONS: 1. Aspirin 81 mg q.d. 2. Norvasc 10 mg q.d. 3. Nephrovite q.d. 4. TUMS 400 mg two tablets q.a.c. 5. Protonix 40 mg q.d. 6. Oxycodone p.r.n. 7. Quinine p.r.n. 8. Restoril 30 mg q.h.s. p.r.n. 9. Clindamycin 450 mg t.i.d. x 7 days.  HISTORY OF PRESENT ILLNESS:  Please refer to the admission H&P for more detailed summary.  Briefly this is a 77 year old African-Americanan female with end-stage renal disease, hypertension, and focal segmental glomerular sclerosis admitted for fevers and chills.  She received Ancef and Tobramycin at the hemodialysis center.  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  She received a right thigh graft on February 26, 2001, by Dr. Elyn Peers and is status post removal of the right internal jugular catheter.  She complains that her right AV graft has never right since the day it was put in and has noted significant increase in swelling and pain at that site.  LABORATORY DATA:  Sodium 136, potassium 3.3, chloride 94, bicarb 30, BUN 16, creatinine 5.5, and glucose 150.  Albumin 3.1, AST 22, ALT 8, alkaline phosphatase 103, calcium 8.8, bilirubin 0.5, white blood cell count  15.3, hemoglobin 9, hematocrit 27, and platelets 268.  Chest x-ray was negative.  HOSPITAL COURSE:  The patient was admitted to the renal service for further monitoring.  #1 - Exploration of right AV graft.  Dr. Elyn Peers was consulted and her AV graft site was explored and an excision of a lymphocele was performed.  A Jackson-Pratt drain was put in at the procedure.  The patient was started on Tobramycin and Vancomycin.  The patient was postoperative day #3 on the day of discharge.  She also received hemodialysis through that site the day of the excision as well as the day of discharge without problems.  She will follow up with Dr. Elyn Peers.  #2 - Strep virudans positive blood cultures.  The patient has been afebrile during her hospitalization.  Blood cultures did come back with Strep virudans positive.  The patient was started on Clindamycin.  The patient will be continued on Clindamycin 450 mg t.i.d. x 7 days.  This choice of antibiotics was based on sensitivities.  #3 - Anemia.  Ms. Flesch had a hemoglobin of 9 on admission which trended down to 8.8 and then 8.4 and 7.9.  She was transfused two units of packed red blood cells on hemodialysis today, September 23., September 23.  The patient did have an IgG that was negative and was found to be antibody positive with an anti-E blood group O positive.  She will need a hemoglobin drawn at the Dialysis Center on Wednesday, September 25, to monitor her anemia.  She is on Epogen at dialysis.  #4 - Secondary hypoparathyroidism.  Continue Calcijex.  #  5 - Hypertension.  The patients blood pressure remained within good control during her hospitalization with no changes.  #6 - Coronary artery disease, status post catheterization.  We will continue aspirin and Norvasc for her.  CONDITION ON DISCHARGE:  Good.  DISPOSITION:  Discharge to home.  DISCHARGE MEDICATIONS:  As per above.  DISCHARGE INSTRUCTIONS:  Follow up with dialysis on Wednesday at [facility]. They are to draw hemoglobin to monitor her anemia, status post two units of packed red blood cells.  She is to follow a renal diet of 80 grams protein, 2 grams sodium, 2 grams potassium.  FOLLOW-UP:  She should follow up with dialysis as scheduled and with Dr. Elyn Peers in one week.  ALLERGIES:  SULFA, ANCEF, PORCHEPARIN. Dictated by:   Melba Coon, [name] Attending Physician:  Garnetta Buddy DD:  04/24/01 TD:  04/24/01 Job: [id number]

## 2010-12-18 NOTE — Discharge Summary (Signed)
[place]. [facility]  Patient:    [name], [name] Visit Number: [id number] MRN: [id number]          Service Type: DSU Location: [phone] Attending Physician:  Alyson Locket Dictated by:   Maxwell Marion, RNFA Admit Date:  11/03/2001 Discharge Date: 11/04/2001   CC:         [facility]   Discharge Summary  DATE OF BIRTH: 1932/06/02  ADMISSION DIAGNOSIS: Lymphocele right thigh.  PAST MEDICAL HISTORY: 1. End stage renal disease, currently on hemodialysis, Monday, Wednesday, and    Friday schedule at the [facility] location. 2. Hypertension. 3. History of anemia. 4. History of secondary hyperparathyroidism. 5. History of partial hysterectomy. 6. History of coronary artery disease.  ALLERGIES: SULFA AND ANCEF.  DISCHARGE DIAGNOSIS: Lymphocele X2 of the right thigh and right groin, possible infected. State post evacuation of the lymphocele. Placement new arterial limb of her AV Gore-Tex graft.  HISTORY OF PRESENT ILLNESS: [name] is a 77 year old African-American female well known to [facility]. She had a history of end stage renal disease and she has had multiple previous surgeries for access. Most recently in December of 2002, she underwent a simple thrombectomy for right leg AV Gore-tex graft by Dr. Bud Face. At that time, it was noted that she had extensive lymphoceles on the right groin and it was felt that her graft could not be revised at that time.  The October 18, 2001 dialysis noted included an area of right thigh access draining plasm. This was cultured and eventually was returned as positive for MRSA. [facility] was consulted and it was determined that she should undergo exploration of this thigh and graft.  HOSPITAL COURSE: On November 03, 2001, [name] was electively admitted to [facility] to the care of Dr. Tawanna Cooler Early for a 23 hour admission.  She underwent the following surgical procedure. Evacuation  of lymphoceles X2 of the right groin and replacement of the arterial limb of her AV Gore-tex graft. She tolerated this procedure well and was transferred to Unit 5500 after a short stay in the PACU.  She was seen in consultation by the renal service and they arranged for hemodialysis Friday evening, November 03, 2001. Dialysis was accomplished without difficulty in the venous limb of her graft. On the morning of postop day #1, November 04, 2001, [name] is feeling well. The JP drains in her thighs were not draining much. They were removed. Her wounds are satisfactory. She was evaluated by Dr. Arbie Cookey and it is his opinion that she is ready for discharge home today.  DISCHARGE CONDITION: Improved.  DISCHARGE MEDICATIONS: She has been given a prescription for Percocet 1-2 p.o. q.4-6h. p.r.n. for pain. Otherwise she has been instructed to resume her home medications.  ACTIVITY: As tolerated.  DIET: To continue her renal diet.  WOUND CARE: She is to change her dressing daily. She may shower tomorrow November 05, 2001.  FOLLOW-UP: She has an appointment to see Dr. Arbie Cookey at the [facility] office on November 09, 2001. The office will call with the exact time for that appointment. Dictated by:   Maxwell Marion, RNFA Attending Physician:  Alyson Locket DD:  11/04/01 TD:  11/06/01 Job: [id number]

## 2010-12-18 NOTE — Consult Note (Signed)
[place]. [facility]  Patient:    [name], [name] Visit Number: [id number] MRN: [id number]          Service Type: SUR Location: 5500 [id number] 02 Attending Physician:  Colvin Caroli Dictated by:   Delano Metz, [name] Admit Date:  02/10/2002 Discharge Date: 02/14/2002                            Consultation Report  REASON FOR CONSULTATION:  Dialysis and medical services.  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  She has hypertension and anemia and has had multiple dialysis access problems with multiple catheter infections and failed A-V grafts.  She has had problems with a lymphocele of her right A-V graft and has been repaired once last year by Dr. Caralee Ates, drained and repaired in April of this year by Dr. Kristen Loader. Early and she presented again today to the emergency room with progressive increase in swelling -- larger than grapefruit size -- of the area over her right inguinal region.  She was admitted by Dr. Quita Skye. Hart Rochester, who did another lymphocele drainage with revision of the A-V graft today.  She has not had any fevers at home according to the family; the patient cannot provide any history, currently she is sedated postoperatively.  Vital signs are stable after the surgery.  She was dialyzed yesterday, on Friday.  PAST MEDICAL HISTORY: 1. ESRD, as above. 2. Hypertension. 3. Anemia. 4. Multiple access failures.  MEDICATIONS:  See chart and orders.  ALLERGIES:  SEPTRA.  SOCIAL HISTORY:  The patient is cared for by several family members.  No tobacco or alcohol use.  REVIEW OF SYSTEMS:  GENERAL:  According to the family, the patient has not had any recent fevers, chills, sweats or weight loss.  ENT:  No recent hearing loss, visual changes, sore throat or difficulty swallowing.  RESPIRATORY:  No recent cough, productive  cough, pleuritic chest pain.  CARDIAC:  No recent substernal chest pain, orthopnea or PND.  GI:  No recent nausea, vomiting, diarrhea or abdominal pain.  GU:  Makes small amounts of urine.  No recent dysuria.  MUSCULOSKELETAL:  No recent joint swelling or pain.  NEUROLOGIC:  No history of recent focal weakness or numbness.  PHYSICAL EXAMINATION:  VITAL SIGNS:  Blood pressure 170/80, pulse is 80, respirations 16, temperature 97.0.  GENERAL:  The patient is sedated postoperatively.  She is moving all extremities equally and will arouse to a loud voice but falls immediately back to sleep.  In general, she is in no acute distress.  HEENT:  PERL.  EOMI.  Throat is clear.  NECK:  Neck is supple without JVD.  CHEST:  Clear throughout.  CARDIAC:  Regular rate and rhythm without murmur, rub or gallop.  ABDOMEN:  Soft, mildly obese, nontender with active bowel sounds.  EXTREMITIES:  The right thigh is wrapped in an Ace bandage, which was not removed.  There is some mild peripheral edema of the ankles.  Pulses in both feet are intact.  There is no evidence of ulceration or gangrene on the feet.  NEUROLOGIC:  Moving all four extremities but disoriented due to medications.  LABORATORY AND ACCESSORY DATA:  Sodium 141, potassium 4.9.  Hemoglobin 11, white blood count 6000, platelets 235,000.  Calcium 10.0.  IMPRESSION: 1. End-stage renal disease. 2. Anemia.  3. Hypertension, on Lopressor and Norvasc. 4. Status post revision of recurrent arteriovenous graft lymphocele in the    right groin earlier today. 5. Multiple catheter and arteriovenous graft exit failures. 6. Coumadin anticoagulation at low dose for graft patency.  RECOMMENDATIONS:  Will provide dialysis and medical service while the patient is hospitalized. Dictated by:   Delano Metz, [name] Attending Physician:  Colvin Caroli DD:  02/10/02 TD:  02/13/02 Job: [id number]

## 2010-12-18 NOTE — Consult Note (Signed)
[place]. [facility]  Patient:    [name], ROTONDO                         MRN: [id number] Proc. Date: 12/15/00 Adm. Date:  [id number] Disc. Date: [id number] Attending:  Dayle Points                          Consultation Report  DATE OF BIRTH:  1931-12-09.  REFERRING PHYSICIAN:  Aram Beecham B. Eliott Nine, [name]  REASON FOR CONSULTATION:  Numbness and speech difficulty.  HISTORY OF PRESENT ILLNESS:  This is the initial inpatient consultation evaluation of this 77 year old right-handed woman with past medical history including hypertension and end-stage renal disease on hemodialysis who was undergoing her routine dialysis yesterday after noon when shortly after starting she noticed a sudden onset of a "hot sensation."  She felt this radiating throughout her body.  She then said she felt a numbness in her hands and feet and stated that her arms and legs began to twitch or shake uncontrollably.  She describes this has a twitching rather than a trembling or rigor.  She said that she was awake during all of this.  She also reports that she tried to speak to someone who was passing by her and they stated they could not understand her because she really was not making any comprehensible sounds.  This went on about five minutes and afterwards she felt bad.  She then developed a very severe headache.  Again, there was no clear loss of consciousness during the episode.  Blood pressures were obtained during the episode and remained around 130 systolic.  Upon arrival, she was neurologically normal but still complaining of a throbbing headache near the crown of the head which has only let up today. She denies ever having any episode like this before.  She does report having severe headaches in the past, although none quite like this.  PAST MEDICAL HISTORY:  End-stage renal disease for three years on hemodialysis secondary to hypertension.  She has consistently refused  having a fistula placed and reportedly has been dialyzed through catheters.  She has had several episodes of line sepsis, most recent Candida earlier this month for which she had to have a line replaced.  FAMILY HISTORY:  She reports that her daughter has fairly severe migraine headaches.  There is no history of stroke.  SOCIAL HISTORY:  She is unemployed and lives with her husband.  She used to use tobacco lightly.  Denies using alcohol.  ALLERGIES:  She got angioedema from ANCEF several days ago when her line was changed.  MEDICATIONS:  Diflucan, Norvasc, low-dose Coumadin, calcium carbonate, Protonix, renal vitamins.  REVIEW OF SYSTEMS:  Decreased appetite and decreased mood over the past several months which dates back to earlier this year when she lost a grandson. She also had some anhedonia over that period of time.  She is having fairly severe fevers and chills during the line episodes, but that has been better this month.  She has had persistent nausea, unable to keep things down very well the past couple of months and did not eat anything yesterday morning prior to dialysis.  No shortness of breath, chest pain.  PHYSICAL EXAMINATION:  VITAL SIGNS:  Temperature 98.4, blood pressure 151/79, pulse 84, respirations 20.  Oxygen saturation 96% on two liters.  GENERAL:  She is alert and in no evident distress.  Speech is fluent and not dysarthric.  HEENT:  Normocephalic and atraumatic.  Oropharynx is clear.  NECK:  Supple without bruit.  HEART:  Regular rate and rhythm without murmurs.  NEUROLOGICAL:  She is awake, alert, and completely oriented.  Speech is fluent and not dysarthric.  Mood is euthymic and affect appropriate.  Cranial nerve and funduscopic exam benign.  Pupils are equal and briskly reactive. Extraocular movements are without nystagmus.  Visual fields are full to confrontation.  Face, tongue, and palate all move normally in the midline. Motor exam shows  normal bulk and tone.  No atrophy fasciculations.  Normal strength in all tested extremity muscles.  Sensation intact to light touch in all extremities.  Reflexes are 2+ and symmetric.  Toes are downgoing.  Her gait is normal and she is actually able to heel-toe and tandem walk rather well.  LABORATORY DATA:  CBC show white cells 11.9, hemoglobin 9.1.  BMET is basically unremarkable except for elevated BUN and creatinine.  Pro time is 15.2 with INR of 1.3.  LFTs are normal.  MRI of the brain is personally reviewed and is basically normal for someone with her medical problems.  There is a suggestion of some atherosclerotic disease at the top of the basilar MRI images.  MRA was not performed.  EEG is also personally reviewed and is normal.  IMPRESSION:  Transient episode of sensory changes and jerking of all four extremities followed by headache.  Differential diagnosis is extensive. 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  Other possibilities include vertebral basilar transient ischemic attack, possibly hypoglycemia.  I do not think she had a seizure and there is no evidence on imaging that she had a stroke.  RECOMMENDATIONS:  Start antiplatelet therapy, aspirin 81 or 325 mg would be adequate.  Recommend checking MRA of the brain to rule out vertebral basilar lesion.  If okay, no further workup.  She has symptoms of depression.  Would consider low-dose antidepressant.  Agree with discharge after dialysis tomorrow if she looks okay pending a decision regarding MRA. DD:  12/15/00 TD:  12/17/00 Job: [id number]

## 2010-12-18 NOTE — Op Note (Signed)
[place]. [facility]  Patient:    [name], [name] Visit Number: [id number] MRN: [id number]          Service Type: DSU Location: 3000 3023 [id number] Attending Physician:  Melvenia Needles Proc. Date: 07/29/01 Admit Date:  07/29/2001                             Operative Report  PREOPERATIVE DIAGNOSES: 1. End-stage renal failure. 2. Clotted right thigh arteriovenous Gore-Tex graft.  POSTOPERATIVE DIAGNOSES: 1. End-stage renal failure. 2. Clotted right thigh arteriovenous Gore-Tex graft. 3. Extensive lymphocele right thigh.  PROCEDURE PERFORMED:  Simple thrombectomy right leg arteriovenous Gore-Tex graft.  SURGEON:  Denman George, [name]  ASSISTANT:  Areta Haber, P.A.  ANESTHESIA:  General endotracheal.  INDICATION:  This is a 77 year old black female with end-stage renal failure. She has had a right leg arteriovenous Gore-Tex graft in place for approximately six months.  The graft occluded and she is brought to the operating room at this time for exploration.  Her preoperative evaluation revealed extensive lymphocele surrounding the graft in the right thigh.  DESCRIPTION OF PROCEDURE:  The patient was brought to the operating room in stable condition.  She was placed in the supine position.  General endotracheal anesthesia was induced.  The right leg was prepped and draped in a sterile fashion.  The skin and subcutaneous tissue were incised through the scar on the right groin.  There was a large lymphocele present in the right groin.  Dissection was extremely difficult.  The venous limb of the graft was eventually dissected out proximal to the anastomosis.  The anastomosis was encased in lymphocele.  The graft was encircled with a vessel loop and opened transversely with an 11 blade.  A #5 Fogarty catheter was passed proximally and distally.  Excellent inflow obtained.  Several passes were made across the venous anastomosis with good venous  back bleeding obtained.  The transverse graft incision was closed with running 5-0 Prolene suture. Clamps were removed.  A good audible Doppler signal was present in the graft at the termination of the procedure.  The subcutaneous tissue was then closed with a single layer of running 2-0 Vicryl suture.  The skin was closed with staples.  A sterile dressing was applied.  The patient tolerated the procedure well.  She was transferred to the recovery room in stable condition. Attending Physician:  Melvenia Needles DD:  07/29/01 TD:  07/30/01 Job: [id number]

## 2010-12-18 NOTE — Discharge Summary (Signed)
[place]. [facility]  Patient:    [name], [name]                         MRN: [id number] Adm. Date:  [id number] Disc. Date: [id number] Attending:  Melvenia Needles Dictator:   Tollie Pizza. Thomasena Edis, P.A.-C. CC:         [name], [name]  [facility]   Discharge Summary  PRIMARY ADMITTING DIAGNOSES: 1. End-stage renal disease on hemodialysis on Monday, Wednesday, Friday at    [facility]. 2. Status post removal of infected Ash catheter approximately one week ago.  SECONDARY DIAGNOSES: 1. History of hypertension. 2. History of tobacco abuse.  PROCEDURES: 1. Ultrasound localization. 2. Attempted bilateral IJ Ash catheter insertion, unsuccessful. 3. Successful insertion of left subclavian Quinton catheter.  HISTORY OF PRESENT ILLNESS:  The patient is a 77 year old black female with end-stage renal disease.  She has been on hemodialysis for three years.  She has recently been seen for Ash catheter removal secondary to current sepsis. She was admitted on Dec 08, 2000 for outpatient placement of new Ash catheter. This was unsuccessful, and therefore she was admitted for overnight observation and second attempt at catheter placement.  HOSPITAL COURSE:  She was admitted to hemodialysis unit following her surgery. On Dec 09, 2000, she was returned to the OR and under local anesthesia with sedation and an ultrasound localization was performed in the left IJ vein and left subclavian Quinton catheter was successfully inserted.  She tolerated this well.  She was returned to the dialysis unit.  She did continue her regular hemodialysis schedule while in house.  She has remained afebrile and vital signs have been stable.  The catheter insertion site as well as the sites of the Ash catheter attempted placement are all healing well.  It is felt that she can be discharged home at this time.  DISCHARGE INSTRUCTIONS:  She is to clean her incision  sites daily with soap and water and call if she experiences any incisional changes or fever greater than 101.  She is also to continue her regular dialysis schedule along with a renal diet which has previously been outlined for her.  DISCHARGE MEDICATIONS:  She is to continue her home medications.  She is given a prescription for Percocet 1-2 q.4h. p.r.n. pain.  She is also to continue Diflucan 200 mg p.o. q.h.s. for a total of a two week course.  FOLLOW-UP:  Follow up will be at her regular dialysis schedule and CVTS may be reconsulted if needed. DD:  12/10/00 TD:  12/13/00 Job: [id number]

## 2010-12-18 NOTE — H&P (Signed)
[place]. [facility]  Patient:    [name], [name] Visit Number: [id number] MRN: [id number]          Service Type: SUR Location: 5500 5530 02 Attending Physician:  Colvin Caroli Dictated by:   Loura Pardon, P.A. Admit Date:  02/10/2002                           History and Physical  DATE OF BIRTH:  18-Feb-1932  PRESENTING CIRCUMSTANCE:  "I have this lump in my right thigh which is causing pain."  HISTORY OF PRESENT ILLNESS:  The patient is a 77 year old African American female who is status post evacuation of lymphocele in the right thigh and in the right groin on November 03, 2001 by Dr. Gretta Began.  She has a right thigh arteriovenous Gore-Tex graft by that physician.  She presents today with increased mass in the right groin and increasing pain.  It is felt to be a return of her lymphocele but could possibly be an expanding pseudoaneurysm. She was taken to the operating room emergently for exploration by Dr. Hart Rochester.  ALLERGIES: 1. SULFA. 2. SEPTRA.  MEDICATIONS: 1. Norvasc 5 mg daily. 2. Enteric-coated aspirin 81 mg daily. 3. Protonix 40 mg daily. 4. Restoril 30 mg at bedtime. 5. Vicodin 1-2 tablets p.o. q.4-6h. p.r.n. pain. 6. Nephro-Vite daily. 7. Tums 300 mg 2 tablets before meals. 8. Quinine sulfate 325 mg p.o. b.i.d. as needed for cramping.  PAST MEDICAL HISTORY: 1. End-stage renal disease for the last four years on hemodialysis Monday,    Wednesday, and Friday at [facility]. 2. Hypertension. 3. History of anemia. 4. Secondary hyperparathyroidism. 5. Status post partial hysterectomy. 6. History of atherosclerotic coronary artery disease, status post left heart    catheterization with stenting July 2002.  PAST SURGICAL HISTORY: 1. History of multiple access procedures to the upper extremities, now all    thrombosed. 2. Placement of right thigh arteriovenous Gore-Tex graft with revision in  September 2002 with evacuation of lymphocele in the right groin and right    thigh April 2003.  SOCIAL HISTORY:  She lives with her grandson.  She does not partake of alcohol or tobacco products.  FAMILY HISTORY:  Mother also had end-stage renal disease.  REVIEW OF SYSTEMS:  She has no chest pain, no dyspnea, no nausea, no abdominal pain, no diarrhea or constipation.  She has no recent weight loss or gain. She is not experiencing hemoptysis, hematochezia, epistaxis, or a GI bleed. She has no history of diabetes.  She has never had a stroke.  PHYSICAL EXAMINATION:  GENERAL:  This was an alert, oriented female comfortable at the current point. She is status post surgery for evacuation of lymphocele earlier today.  VITAL SIGNS:  Temperature 97, blood pressure 170/80, pulse 90, respirations 20.  HEENT:  Eyes:  Pupils equal, round, and reactive to light.  Extraocular movements are intact.  Nares are patent.  Sinuses clear.  The oropharynx shows the patient has good dentition.  LUNGS:  Clear to auscultation and percussion bilaterally.  HEART:  Regular rate and rhythm.  ABDOMEN:  Soft, nondistended.  Bowel sounds are present.  EXTREMITIES:  The lower extremities have no ischemic changes, no ulcerations, no varicosities.  The right thigh is postsurgical.  There is a Jackson-Pratt drain in place.  Dressing covers surgical incisions.  See operative note for details of surgery.  IMPRESSION:  Lymphocele  right thigh, organized, encircling the right thigh arteriovenous Gore-Tex graft.  PLAN:  She is postoperative after evacuation of organized lymphocele in the right groin, as well as thrombectomy and revision of right thigh arteriovenous Gore-Tex graft. Dictated by:   Loura Pardon, P.A. Attending Physician:  Colvin Caroli DD:  02/10/02 TD:  02/13/02 Job: [id number]

## 2010-12-18 NOTE — Op Note (Signed)
[place]. [facility]  Patient:    AMEL, KITCH Visit Number: [id number] MRN: [id number]          Service Type: MED Location: [id number] Attending Physician:  Garnetta Buddy Dictated by:   Larina Earthly, [name] Proc. Date: 04/21/01 Admit Date:  04/19/2001                             Operative Report  PREOPERATIVE DIAGNOSIS:  Right groin mass.  POSTOPERATIVE DIAGNOSIS:  Right groin mass with organized lymphocele around right femoral A-V Gore-Tex graft.  PROCEDURE:  Right groin exploration and removal of organized lymphocele.  SURGEON:  Larina Earthly, [name]  ANESTHESIA:  Endotracheal.  DISPOSITION:  To recovery room stable.  DRAINS:  10 flat Al Pimple in the wound.  DESCRIPTION OF PROCEDURE:  The patient was taken to the operating room and placed in position where the area of the right groin and right thigh were prepped and draped in the usual sterile fashion.  Incision was made over the mass in the groin and carried down to enter this and it was a very large approximately 6-7 cm in diameter lymphocele around the arterial and venous limbs of the loop for a loop thigh graft.  The organized lymphocele was removed in its entirety and sent for specimen. The wound was irrigated and hemostasis was obtained with electrocautery. Using a separate stab incision a 10 flat Al Pimple drain was brought through a separate tunnel into the base of the open wound.  The wound was also cultured for gram stain for routine CNS.  The drain was secured to the skin with a 3-0 nylon stitch. The wound itself was closed with 3-0 Vicryl in the subcutaneous and subcuticular tissue and the Steri-Strips were applied and the patient was taken to the recovery room in stable condition. Dictated by:   Larina Earthly, [name] Attending Physician:  Garnetta Buddy DD:  04/21/01 TD:  04/22/01 Job: [id number]

## 2011-09-02 DIAGNOSIS — Z940 Kidney transplant status: Secondary | ICD-10-CM | POA: Diagnosis not present

## 2011-09-02 DIAGNOSIS — Z79899 Other long term (current) drug therapy: Secondary | ICD-10-CM | POA: Diagnosis not present

## 2011-09-03 DIAGNOSIS — Z79899 Other long term (current) drug therapy: Secondary | ICD-10-CM | POA: Diagnosis not present

## 2011-09-03 DIAGNOSIS — D649 Anemia, unspecified: Secondary | ICD-10-CM | POA: Diagnosis not present

## 2011-09-03 DIAGNOSIS — N2581 Secondary hyperparathyroidism of renal origin: Secondary | ICD-10-CM | POA: Diagnosis not present

## 2011-09-03 DIAGNOSIS — Z940 Kidney transplant status: Secondary | ICD-10-CM | POA: Diagnosis not present

## 2011-12-03 ENCOUNTER — Emergency Department (HOSPITAL_COMMUNITY)
Admission: EM | Admit: 2011-12-03 | Discharge: 2011-12-03 | Disposition: A | Discharge status: Home / Self Care | Payer: Medicare Other | Attending: Emergency Medicine | Admitting: Emergency Medicine

## 2011-12-03 ENCOUNTER — Emergency Department (HOSPITAL_COMMUNITY): Payer: Medicare Other

## 2011-12-03 ENCOUNTER — Encounter (HOSPITAL_COMMUNITY): Payer: Self-pay | Admitting: Emergency Medicine

## 2011-12-03 DIAGNOSIS — I10 Essential (primary) hypertension: Secondary | ICD-10-CM | POA: Diagnosis not present

## 2011-12-03 DIAGNOSIS — J90 Pleural effusion, not elsewhere classified: Secondary | ICD-10-CM | POA: Diagnosis not present

## 2011-12-03 DIAGNOSIS — R269 Unspecified abnormalities of gait and mobility: Secondary | ICD-10-CM | POA: Insufficient documentation

## 2011-12-03 DIAGNOSIS — R42 Dizziness and giddiness: Secondary | ICD-10-CM | POA: Diagnosis not present

## 2011-12-03 DIAGNOSIS — I2510 Atherosclerotic heart disease of native coronary artery without angina pectoris: Secondary | ICD-10-CM | POA: Insufficient documentation

## 2011-12-03 DIAGNOSIS — J9819 Other pulmonary collapse: Secondary | ICD-10-CM | POA: Diagnosis not present

## 2011-12-03 DIAGNOSIS — Z79899 Other long term (current) drug therapy: Secondary | ICD-10-CM | POA: Insufficient documentation

## 2011-12-03 DIAGNOSIS — R002 Palpitations: Secondary | ICD-10-CM | POA: Insufficient documentation

## 2011-12-03 DIAGNOSIS — J329 Chronic sinusitis, unspecified: Secondary | ICD-10-CM | POA: Insufficient documentation

## 2011-12-03 DIAGNOSIS — Z940 Kidney transplant status: Secondary | ICD-10-CM | POA: Insufficient documentation

## 2011-12-03 DIAGNOSIS — I671 Cerebral aneurysm, nonruptured: Secondary | ICD-10-CM | POA: Insufficient documentation

## 2011-12-03 DIAGNOSIS — J019 Acute sinusitis, unspecified: Secondary | ICD-10-CM | POA: Diagnosis not present

## 2011-12-03 DIAGNOSIS — J438 Other emphysema: Secondary | ICD-10-CM | POA: Diagnosis not present

## 2011-12-03 HISTORY — DX: Atherosclerotic heart disease of native coronary artery without angina pectoris: I25.10

## 2011-12-03 HISTORY — DX: Kidney transplant status: Z94.0

## 2011-12-03 LAB — URINALYSIS, ROUTINE W REFLEX MICROSCOPIC
Bilirubin Urine: NEGATIVE
Glucose, UA: NEGATIVE mg/dL
Specific Gravity, Urine: 1.017 (ref 1.005–1.030)
Urobilinogen, UA: 0.2 mg/dL (ref 0.0–1.0)
pH: 5.5 (ref 5.0–8.0)

## 2011-12-03 LAB — BASIC METABOLIC PANEL
GFR calc Af Amer: 46 mL/min — ABNORMAL LOW (ref >90)
GFR calc non Af Amer: 39 mL/min — ABNORMAL LOW (ref >90)
Glucose, Bld: 96 mg/dL (ref 70–99)
Potassium: 4.6 mEq/L (ref 3.5–5.1)
Sodium: 135 mEq/L (ref 135–145)

## 2011-12-03 LAB — URINE MICROSCOPIC-ADD ON

## 2011-12-03 LAB — CBC
Hemoglobin: 12.3 g/dL (ref 12.0–15.0)
MCHC: 32.4 g/dL (ref 30.0–36.0)
Platelets: 201 10*3/uL (ref 150–400)

## 2011-12-03 MED ORDER — AZITHROMYCIN 250 MG PO TABS: ORAL_TABLET | ORAL | Status: DC

## 2011-12-03 MED ORDER — MECLIZINE HCL 25 MG PO TABS
25.0000 mg | ORAL_TABLET | Freq: Once | ORAL | Status: AC
  Administered 2011-12-03: 25 mg via ORAL
  Filled 2011-12-03: qty 1

## 2011-12-03 MED ORDER — GADOBENATE DIMEGLUMINE 529 MG/ML IV SOLN
14.0000 mL | Freq: Once | INTRAVENOUS | Status: AC | PRN
  Administered 2011-12-03: 14 mL via INTRAVENOUS

## 2011-12-03 MED ORDER — MECLIZINE HCL 25 MG PO TABS
25.0000 mg | ORAL_TABLET | Freq: Three times a day (TID) | ORAL | Status: DC | PRN
Start: 2011-12-03 — End: 2012-04-19

## 2011-12-03 MED ORDER — ACETAMINOPHEN 325 MG PO TABS
650.0000 mg | ORAL_TABLET | Freq: Once | ORAL | Status: AC
  Administered 2011-12-03: 650 mg via ORAL
  Filled 2011-12-03: qty 2

## 2011-12-03 NOTE — ED Provider Notes (Signed)
1:21 PM Patient is in CDU holding for labs, CT scan, possible MRI brain, MRA head and neck.  Patient with vertigo and odd visual sensation of the room closing in on her, also occasional gait imbalance.  This is worse when she turns her head to the side.  Pt continues to have these symptoms.  They began 2 days ago.  On exam, pt is A&Ox4, NAD, RRR, CTAB. CN III-XII intact, EOMs intact, no nystagmus, no pronator drift, grip strengths equal bilaterally; finger to nose is normal; strength 5/5 in all extremities, sensation is intact.  Patient with continued symptoms.  Labs show elevated BUN/creat, and radiographs as follows:  Chest xray:  " IMPRESSION: Emphysematous and bronchitic changes with right basilar atelectasis and small right pleural effusion. Enlargement of cardiac silhouette with pulmonary vascular congestion. Question new nodular density at the right upper lobe 15 x 12 mm in size versus artifact related to the right first costochondral junction. Either follow-up radiographs to ensure resolution or CT chest to exclude pulmonary nodule recommended."  CT Head "IMPRESSION: 1. No acute intracranial abnormalities. 2. The appearance of the brain is normal. 3. Extensive multifocal paranasal sinus disease with both chronic and acute features, as detailed above. The patient is status post bilateral maxillary antrectomy."    3:13 PM Patient discussed with Felicie Morn, NP, who assumes care of patient at change of shift.  Pt is currently in MRI and I have not yet discussed her results with her.  Onalee Hua is aware and will discuss results with patient upon her return.  Patient with sinus disease on CT and small right pleural effusion.  She has had URI symptoms this week.  I have recommended that patient be d/c home on antibiotics for this if MRI is negative.       Dillard Cannon [place], [place] 12/03/11 1517

## 2011-12-03 NOTE — ED Notes (Signed)
Returned from MRI, received call from pt's grand daughter st's she is on her way here.  Pt informed of this.

## 2011-12-03 NOTE — ED Provider Notes (Signed)
History     CSN: [id number]  Arrival date & time 12/03/11  1030   First MD Initiated Contact with Patient 12/03/11 1058      Chief Complaint  Patient presents with  . Dizziness  . Palpitations    (Consider location/radiation/quality/duration/timing/severity/associated sxs/prior treatment) HPI Comments: Patient presents with the complaint of dizziness.  Patient notes that it is worst when she is lying down.  When she turns her head to the left she feels that all the objects in her room her spinning and closing in on her.  She did not have loss of consciousness.  It appears to specifically happen when she turns her head to the left.  She has no focal weakness, numbness or slurred speech.  No other vision changes.  This is new for the patient in the last 2 days.  She also notes that she sometimes feel like she's walking like she is drunk which is abnormal for her.  Patient does not use a cane or a walker at baseline.  She's otherwise felt well.  She denies headache or neck pain and has no recent trauma or falls.  No fevers.  Patient has some mild cough.  Patient is a 78 y.o. female presenting with palpitations. The history is provided by the patient. No language interpreter was used.  Palpitations  Associated symptoms include dizziness. Pertinent negatives include no fever, no chest pain, no abdominal pain, no nausea, no vomiting, no headaches, no back pain, no cough and no shortness of breath.    Past Medical History  Diagnosis Date  . S/P kidney transplant   . Coronary artery disease   . Hypertension     History reviewed. No pertinent past surgical history.  History reviewed. No pertinent family history.  History  Substance Use Topics  . Smoking status: Never Smoker   . Smokeless tobacco: Not on file  . Alcohol Use: No    OB History    Grav Para Term Preterm Abortions TAB SAB Ect Mult Living                  Review of Systems  Constitutional: Negative.  Negative for  fever and chills.  HENT: Negative.   Eyes: Negative.  Negative for discharge and redness.  Respiratory: Negative.  Negative for cough and shortness of breath.   Cardiovascular: Negative for chest pain and palpitations.  Gastrointestinal: Negative.  Negative for nausea, vomiting, abdominal pain and diarrhea.  Genitourinary: Negative.  Negative for dysuria and vaginal discharge.  Musculoskeletal: Positive for gait problem. Negative for back pain.  Skin: Negative.  Negative for color change and rash.  Neurological: Positive for dizziness. Negative for syncope and headaches.  Hematological: Negative.  Negative for adenopathy.  Psychiatric/Behavioral: Negative.  Negative for confusion.  All other systems reviewed and are negative.    Allergies  Sulfa drugs cross reactors  Home Medications   Current Outpatient Rx  Name Route Sig Dispense Refill  . ALLOPURINOL 100 MG PO TABS Oral Take 100 mg by mouth at bedtime.    Marland Kitchen AMLODIPINE BESYLATE 10 MG PO TABS Oral Take 10 mg by mouth daily.    . ASPIRIN 81 MG PO TABS Oral Take 81 mg by mouth daily.    Marland Kitchen CINACALCET HCL 30 MG PO TABS Oral Take 30 mg by mouth daily.    . COLCHICINE 0.6 MG PO TABS Oral Take 0.6 mg by mouth at bedtime and may repeat dose one time if needed.    Marland Kitchen FAMOTIDINE  20 MG PO TABS Oral Take 20 mg by mouth 2 (two) times daily.    . FUROSEMIDE 80 MG PO TABS Oral Take 80 mg by mouth daily.    Marland Kitchen POLYSACCHARIDE IRON COMPLEX 150 MG PO CAPS Oral Take 150 mg by mouth 2 (two) times daily.    Marland Kitchen LOSARTAN POTASSIUM 25 MG PO TABS Oral Take 25 mg by mouth daily.    Marland Kitchen METOPROLOL TARTRATE 50 MG PO TABS Oral Take 100 mg by mouth 2 (two) times daily.    Marland Kitchen MYCOPHENOLATE MOFETIL 250 MG PO CAPS Oral Take 500 mg by mouth 2 (two) times daily.    Marland Kitchen SIMVASTATIN 20 MG PO TABS Oral Take 20 mg by mouth daily.    Marland Kitchen TACROLIMUS 1 MG PO CAPS Oral Take 3 mg by mouth 2 (two) times daily.      BP 138/54  Pulse 54  Temp(Src) 97 F (36.1 C) (Oral)  Resp 18   SpO2 100%  Physical Exam  Nursing note and vitals reviewed. Constitutional: She is oriented to person, place, and time. She appears well-developed and well-nourished.  Non-toxic appearance. She does not have a sickly appearance.  HENT:  Head: Normocephalic and atraumatic.  Eyes: Conjunctivae, EOM and lids are normal. Pupils are equal, round, and reactive to light. No scleral icterus.  Neck: Trachea normal and normal range of motion. Neck supple.  Cardiovascular: Normal rate, regular rhythm and normal heart sounds.   Pulmonary/Chest: Effort normal and breath sounds normal.  Abdominal: Soft. Normal appearance. There is no tenderness. There is no rebound, no guarding and no CVA tenderness.  Musculoskeletal: Normal range of motion.  Neurological: She is alert and oriented to person, place, and time. She has normal strength.       Cranial nerves II through XII are intact.  Tongue is midline.  Face is symmetric.  Extraocular eye movements are intact.  Notably the eye movements do not elicit the vertigo sensation.  No visual field deficits.  Normal finger to nose and heel to shin testing bilaterally.  No pronator drift.  Normal gait.  Normal speech.  No focal losses of sensation in her arms or legs.  Symmetric strength in her arms and legs.  Skin: Skin is warm, dry and intact. No rash noted.  Psychiatric: She has a normal mood and affect. Her behavior is normal. Judgment and thought content normal.    ED Course  Procedures (including critical care time)  Results for orders placed during the hospital encounter of 12/03/11  CBC      Component Value Range   WBC 7.7  4.0 - 10.5 (K/uL)   RBC 4.19  3.87 - 5.11 (MIL/uL)   Hemoglobin 12.3  12.0 - 15.0 (g/dL)   HCT 40.9  81.1 - 91.4 (%)   MCV 90.7  78.0 - 100.0 (fL)   MCH 29.4  26.0 - 34.0 (pg)   MCHC 32.4  30.0 - 36.0 (g/dL)   RDW 78.2  95.6 - 21.3 (%)   Platelets 201  150 - 400 (K/uL)       Date: 12/03/2011  Rate: 54  Rhythm: sinus  bradycardia  QRS Axis: normal  Intervals: normal  ST/T Wave abnormalities: normal  Conduction Disutrbances:none  Narrative Interpretation:   Old EKG Reviewed: unchanged from 08/10/2004   MDM  Patient with symptoms of vertigo by history.  While some of the onset appears to be positional patient also notes that she's had some difficulty with her gait which she described as  walking like she is "drunk".  This makes me concerned given the patient's age and other comorbidities that she might be at risk for posterior circulation insufficiency.  I'll begin evaluation with a CBC, BMP troponin, urinalysis and a CT head.  Anticipate the patient may need an MRI and MRA to further evaluate these symptoms to rule out signs of abnormalities.        Nat Christen, MD 12/03/11 [phone]

## 2011-12-03 NOTE — ED Provider Notes (Signed)
Radiology results reviewed and discussed with Dr. Golda Acre and with patient.  Patient made aware of incidental findings on chest film and MRI that will need monitoring on an outpatient basis.  Dr. Briant Cedar provides the patient's primary and renal care.  Will treat vertigo with meclizine and bronchitis/sinusitis with z-pack.  Jimmye Norman, NP 12/03/11 [phone]

## 2011-12-03 NOTE — ED Notes (Signed)
Pt sts dizziness worse when laying down x 3 days; pt sts had some palpitations this am but denies at present; no neuro deficits noted

## 2011-12-03 NOTE — ED Notes (Signed)
Patient transported to MRI 

## 2011-12-03 NOTE — ED Notes (Signed)
Pt in MRI at this time 

## 2011-12-04 NOTE — ED Provider Notes (Signed)
Medical screening examination/treatment/procedure(s) were conducted as a shared visit with non-physician practitioner(s) and myself.  I personally evaluated the patient during the encounter   Quade Ramirez A Jeromey Kruer, MD 12/04/11 1259 

## 2011-12-04 NOTE — ED Provider Notes (Signed)
Medical screening examination/treatment/procedure(s) were conducted as a shared visit with non-physician practitioner(s) and myself.  I personally evaluated the patient during the encounter   [name] A Joy Saindon, MD 12/04/11 1259 

## 2011-12-09 DIAGNOSIS — Z79899 Other long term (current) drug therapy: Secondary | ICD-10-CM | POA: Diagnosis not present

## 2011-12-09 DIAGNOSIS — N2581 Secondary hyperparathyroidism of renal origin: Secondary | ICD-10-CM | POA: Diagnosis not present

## 2011-12-09 DIAGNOSIS — Z940 Kidney transplant status: Secondary | ICD-10-CM | POA: Diagnosis not present

## 2011-12-09 DIAGNOSIS — D649 Anemia, unspecified: Secondary | ICD-10-CM | POA: Diagnosis not present

## 2011-12-09 DIAGNOSIS — M109 Gout, unspecified: Secondary | ICD-10-CM | POA: Diagnosis not present

## 2011-12-15 DIAGNOSIS — Z940 Kidney transplant status: Secondary | ICD-10-CM | POA: Diagnosis not present

## 2011-12-16 DIAGNOSIS — Z940 Kidney transplant status: Secondary | ICD-10-CM | POA: Diagnosis not present

## 2011-12-16 DIAGNOSIS — Z79899 Other long term (current) drug therapy: Secondary | ICD-10-CM | POA: Diagnosis not present

## 2012-03-16 DIAGNOSIS — E785 Hyperlipidemia, unspecified: Secondary | ICD-10-CM | POA: Diagnosis not present

## 2012-03-16 DIAGNOSIS — Z940 Kidney transplant status: Secondary | ICD-10-CM | POA: Diagnosis not present

## 2012-03-16 DIAGNOSIS — D649 Anemia, unspecified: Secondary | ICD-10-CM | POA: Diagnosis not present

## 2012-03-16 DIAGNOSIS — Z79899 Other long term (current) drug therapy: Secondary | ICD-10-CM | POA: Diagnosis not present

## 2012-03-16 DIAGNOSIS — N2581 Secondary hyperparathyroidism of renal origin: Secondary | ICD-10-CM | POA: Diagnosis not present

## 2012-03-20 DIAGNOSIS — Z940 Kidney transplant status: Secondary | ICD-10-CM | POA: Diagnosis not present

## 2012-03-21 DIAGNOSIS — Z79899 Other long term (current) drug therapy: Secondary | ICD-10-CM | POA: Diagnosis not present

## 2012-03-21 DIAGNOSIS — Z940 Kidney transplant status: Secondary | ICD-10-CM | POA: Diagnosis not present

## 2012-03-22 DIAGNOSIS — Z940 Kidney transplant status: Secondary | ICD-10-CM | POA: Diagnosis not present

## 2012-03-22 DIAGNOSIS — R791 Abnormal coagulation profile: Secondary | ICD-10-CM | POA: Diagnosis not present

## 2012-03-22 DIAGNOSIS — D899 Disorder involving the immune mechanism, unspecified: Secondary | ICD-10-CM | POA: Diagnosis not present

## 2012-03-22 DIAGNOSIS — R799 Abnormal finding of blood chemistry, unspecified: Secondary | ICD-10-CM | POA: Diagnosis not present

## 2012-03-22 DIAGNOSIS — Z79899 Other long term (current) drug therapy: Secondary | ICD-10-CM | POA: Diagnosis not present

## 2012-03-22 DIAGNOSIS — T8610 Unspecified complication of kidney transplant: Secondary | ICD-10-CM | POA: Diagnosis not present

## 2012-03-22 DIAGNOSIS — R944 Abnormal results of kidney function studies: Secondary | ICD-10-CM | POA: Diagnosis not present

## 2012-03-22 DIAGNOSIS — N179 Acute kidney failure, unspecified: Secondary | ICD-10-CM | POA: Diagnosis not present

## 2012-03-23 DIAGNOSIS — D899 Disorder involving the immune mechanism, unspecified: Secondary | ICD-10-CM | POA: Diagnosis not present

## 2012-03-23 DIAGNOSIS — N179 Acute kidney failure, unspecified: Secondary | ICD-10-CM | POA: Diagnosis not present

## 2012-03-23 DIAGNOSIS — T8610 Unspecified complication of kidney transplant: Secondary | ICD-10-CM | POA: Diagnosis not present

## 2012-03-23 DIAGNOSIS — R791 Abnormal coagulation profile: Secondary | ICD-10-CM | POA: Diagnosis not present

## 2012-03-23 DIAGNOSIS — R799 Abnormal finding of blood chemistry, unspecified: Secondary | ICD-10-CM | POA: Diagnosis not present

## 2012-03-23 DIAGNOSIS — Z79899 Other long term (current) drug therapy: Secondary | ICD-10-CM | POA: Diagnosis not present

## 2012-03-23 DIAGNOSIS — R944 Abnormal results of kidney function studies: Secondary | ICD-10-CM | POA: Diagnosis not present

## 2012-03-23 DIAGNOSIS — Z940 Kidney transplant status: Secondary | ICD-10-CM | POA: Diagnosis not present

## 2012-03-29 DIAGNOSIS — R7309 Other abnormal glucose: Secondary | ICD-10-CM | POA: Diagnosis not present

## 2012-03-29 DIAGNOSIS — R7989 Other specified abnormal findings of blood chemistry: Secondary | ICD-10-CM | POA: Diagnosis not present

## 2012-03-29 DIAGNOSIS — Z940 Kidney transplant status: Secondary | ICD-10-CM | POA: Diagnosis not present

## 2012-04-19 ENCOUNTER — Other Ambulatory Visit: Payer: Self-pay

## 2012-04-19 ENCOUNTER — Encounter (HOSPITAL_COMMUNITY): Payer: Self-pay | Admitting: Emergency Medicine

## 2012-04-19 ENCOUNTER — Inpatient Hospital Stay (HOSPITAL_COMMUNITY)
Admission: EM | Admit: 2012-04-19 | Discharge: 2012-04-26 | DRG: 194 | Disposition: A | Discharge status: Home Health | Payer: Medicare Other | Attending: Internal Medicine | Admitting: Internal Medicine

## 2012-04-19 ENCOUNTER — Emergency Department (HOSPITAL_COMMUNITY): Payer: Medicare Other

## 2012-04-19 DIAGNOSIS — M109 Gout, unspecified: Secondary | ICD-10-CM | POA: Diagnosis not present

## 2012-04-19 DIAGNOSIS — I10 Essential (primary) hypertension: Secondary | ICD-10-CM

## 2012-04-19 DIAGNOSIS — Z87891 Personal history of nicotine dependence: Secondary | ICD-10-CM | POA: Diagnosis not present

## 2012-04-19 DIAGNOSIS — Z8639 Personal history of other endocrine, nutritional and metabolic disease: Secondary | ICD-10-CM

## 2012-04-19 DIAGNOSIS — Z940 Kidney transplant status: Secondary | ICD-10-CM | POA: Diagnosis not present

## 2012-04-19 DIAGNOSIS — Z79899 Other long term (current) drug therapy: Secondary | ICD-10-CM

## 2012-04-19 DIAGNOSIS — I369 Nonrheumatic tricuspid valve disorder, unspecified: Secondary | ICD-10-CM | POA: Diagnosis not present

## 2012-04-19 DIAGNOSIS — R911 Solitary pulmonary nodule: Secondary | ICD-10-CM | POA: Diagnosis present

## 2012-04-19 DIAGNOSIS — I509 Heart failure, unspecified: Secondary | ICD-10-CM | POA: Diagnosis not present

## 2012-04-19 DIAGNOSIS — I5030 Unspecified diastolic (congestive) heart failure: Secondary | ICD-10-CM

## 2012-04-19 DIAGNOSIS — R918 Other nonspecific abnormal finding of lung field: Secondary | ICD-10-CM | POA: Diagnosis not present

## 2012-04-19 DIAGNOSIS — R0602 Shortness of breath: Secondary | ICD-10-CM | POA: Diagnosis not present

## 2012-04-19 DIAGNOSIS — I871 Compression of vein: Secondary | ICD-10-CM | POA: Diagnosis present

## 2012-04-19 DIAGNOSIS — J189 Pneumonia, unspecified organism: Secondary | ICD-10-CM | POA: Diagnosis not present

## 2012-04-19 DIAGNOSIS — I2510 Atherosclerotic heart disease of native coronary artery without angina pectoris: Secondary | ICD-10-CM | POA: Diagnosis present

## 2012-04-19 DIAGNOSIS — J42 Unspecified chronic bronchitis: Secondary | ICD-10-CM | POA: Diagnosis not present

## 2012-04-19 DIAGNOSIS — N2581 Secondary hyperparathyroidism of renal origin: Secondary | ICD-10-CM | POA: Diagnosis not present

## 2012-04-19 DIAGNOSIS — Y95 Nosocomial condition: Secondary | ICD-10-CM

## 2012-04-19 DIAGNOSIS — Z9861 Coronary angioplasty status: Secondary | ICD-10-CM

## 2012-04-19 DIAGNOSIS — Z8719 Personal history of other diseases of the digestive system: Secondary | ICD-10-CM

## 2012-04-19 DIAGNOSIS — J9819 Other pulmonary collapse: Secondary | ICD-10-CM | POA: Diagnosis not present

## 2012-04-19 DIAGNOSIS — J90 Pleural effusion, not elsewhere classified: Secondary | ICD-10-CM | POA: Diagnosis not present

## 2012-04-19 LAB — POCT I-STAT TROPONIN I: Troponin i, poc: 0.04 ng/mL (ref 0.00–0.08)

## 2012-04-19 LAB — CREATININE, SERUM: Creatinine, Ser: 1.33 mg/dL — ABNORMAL HIGH (ref 0.50–1.10)

## 2012-04-19 LAB — CBC WITH DIFFERENTIAL/PLATELET
Basophils Absolute: 0 10*3/uL (ref 0.0–0.1)
Eosinophils Absolute: 0.3 10*3/uL (ref 0.0–0.7)
Eosinophils Relative: 5 % (ref 0–5)
Lymphocytes Relative: 29 % (ref 12–46)
MCV: 89.4 fL (ref 78.0–100.0)
Neutrophils Relative %: 56 % (ref 43–77)
Platelets: 239 10*3/uL (ref 150–400)
RBC: 4.44 MIL/uL (ref 3.87–5.11)
RDW: 13.7 % (ref 11.5–15.5)
WBC: 6.9 10*3/uL (ref 4.0–10.5)

## 2012-04-19 LAB — HEPATIC FUNCTION PANEL
AST: 19 U/L (ref 0–37)
Albumin: 4.2 g/dL (ref 3.5–5.2)
Alkaline Phosphatase: 169 U/L — ABNORMAL HIGH (ref 39–117)
Total Bilirubin: 0.7 mg/dL (ref 0.3–1.2)

## 2012-04-19 LAB — BASIC METABOLIC PANEL
Calcium: 9.9 mg/dL (ref 8.4–10.5)
GFR calc Af Amer: 41 mL/min — ABNORMAL LOW (ref >90)
GFR calc non Af Amer: 35 mL/min — ABNORMAL LOW (ref >90)
Glucose, Bld: 100 mg/dL — ABNORMAL HIGH (ref 70–99)
Potassium: 3.9 mEq/L (ref 3.5–5.1)
Sodium: 139 mEq/L (ref 135–145)

## 2012-04-19 LAB — CBC
HCT: 40.4 % (ref 36.0–46.0)
Hemoglobin: 13.5 g/dL (ref 12.0–15.0)
MCH: 29.6 pg (ref 26.0–34.0)
MCHC: 33.4 g/dL (ref 30.0–36.0)

## 2012-04-19 LAB — TROPONIN I: Troponin I: <0.3 ng/mL (ref <0.30)

## 2012-04-19 MED ORDER — MORPHINE SULFATE 2 MG/ML IJ SOLN
2.0000 mg | INTRAMUSCULAR | Status: DC | PRN
  Administered 2012-04-22 – 2012-04-23 (×4): 2 mg via INTRAVENOUS
  Filled 2012-04-19 (×5): qty 1

## 2012-04-19 MED ORDER — FUROSEMIDE 10 MG/ML IJ SOLN
80.0000 mg | Freq: Two times a day (BID) | INTRAMUSCULAR | Status: DC
  Administered 2012-04-20 (×2): 80 mg via INTRAVENOUS
  Filled 2012-04-19 (×4): qty 8

## 2012-04-19 MED ORDER — ACETAMINOPHEN 650 MG RE SUPP: 650.0000 mg | Freq: Four times a day (QID) | RECTAL | Status: DC | PRN

## 2012-04-19 MED ORDER — CINACALCET HCL 30 MG PO TABS
30.0000 mg | ORAL_TABLET | ORAL | Status: DC
  Administered 2012-04-20 – 2012-04-24 (×3): 30 mg via ORAL
  Filled 2012-04-19 (×6): qty 1

## 2012-04-19 MED ORDER — SODIUM CHLORIDE 0.9 % IJ SOLN
3.0000 mL | INTRAMUSCULAR | Status: DC | PRN
  Administered 2012-04-20: 3 mL via INTRAVENOUS

## 2012-04-19 MED ORDER — SIMVASTATIN 20 MG PO TABS
20.0000 mg | ORAL_TABLET | Freq: Every day | ORAL | Status: DC
  Administered 2012-04-19 – 2012-04-25 (×7): 20 mg via ORAL
  Filled 2012-04-19 (×8): qty 1

## 2012-04-19 MED ORDER — ASPIRIN EC 81 MG PO TBEC
81.0000 mg | DELAYED_RELEASE_TABLET | Freq: Every day | ORAL | Status: DC
  Administered 2012-04-20 – 2012-04-26 (×7): 81 mg via ORAL
  Filled 2012-04-19 (×7): qty 1

## 2012-04-19 MED ORDER — FAMOTIDINE 20 MG PO TABS
20.0000 mg | ORAL_TABLET | Freq: Two times a day (BID) | ORAL | Status: DC
  Administered 2012-04-19 – 2012-04-26 (×14): 20 mg via ORAL
  Filled 2012-04-19 (×15): qty 1

## 2012-04-19 MED ORDER — FUROSEMIDE 10 MG/ML IJ SOLN: 80.0000 mg | Freq: Two times a day (BID) | INTRAMUSCULAR | Status: DC

## 2012-04-19 MED ORDER — FUROSEMIDE 10 MG/ML IJ SOLN
80.0000 mg | Freq: Once | INTRAMUSCULAR | Status: AC
  Administered 2012-04-19: 80 mg via INTRAVENOUS
  Filled 2012-04-19: qty 8

## 2012-04-19 MED ORDER — NITROGLYCERIN 0.4 MG SL SUBL: 0.4000 mg | SUBLINGUAL_TABLET | SUBLINGUAL | Status: DC | PRN

## 2012-04-19 MED ORDER — SODIUM CHLORIDE 0.9 % IJ SOLN
3.0000 mL | Freq: Two times a day (BID) | INTRAMUSCULAR | Status: DC
  Administered 2012-04-19 – 2012-04-26 (×12): 3 mL via INTRAVENOUS

## 2012-04-19 MED ORDER — MYCOPHENOLATE MOFETIL 250 MG PO CAPS
500.0000 mg | ORAL_CAPSULE | Freq: Two times a day (BID) | ORAL | Status: DC
  Administered 2012-04-19 – 2012-04-26 (×14): 500 mg via ORAL
  Filled 2012-04-19 (×17): qty 2

## 2012-04-19 MED ORDER — NITROGLYCERIN 2 % TD OINT
0.5000 [in_us] | TOPICAL_OINTMENT | Freq: Once | TRANSDERMAL | Status: AC
  Administered 2012-04-19: 17:00:00 via TOPICAL
  Filled 2012-04-19: qty 1

## 2012-04-19 MED ORDER — SODIUM CHLORIDE 0.9 % IV SOLN: 250.0000 mL | INTRAVENOUS | Status: DC | PRN

## 2012-04-19 MED ORDER — ONDANSETRON HCL 4 MG PO TABS: 4.0000 mg | ORAL_TABLET | Freq: Four times a day (QID) | ORAL | Status: DC | PRN

## 2012-04-19 MED ORDER — ACETAMINOPHEN 325 MG PO TABS: 650.0000 mg | ORAL_TABLET | Freq: Four times a day (QID) | ORAL | Status: DC | PRN

## 2012-04-19 MED ORDER — ALLOPURINOL 100 MG PO TABS
100.0000 mg | ORAL_TABLET | Freq: Every day | ORAL | Status: DC
  Administered 2012-04-19 – 2012-04-21 (×3): 100 mg via ORAL
  Filled 2012-04-19 (×4): qty 1

## 2012-04-19 MED ORDER — AMLODIPINE BESYLATE 10 MG PO TABS
10.0000 mg | ORAL_TABLET | Freq: Every day | ORAL | Status: DC
  Administered 2012-04-20: 10 mg via ORAL
  Filled 2012-04-19 (×2): qty 1

## 2012-04-19 MED ORDER — TACROLIMUS 1 MG PO CAPS
2.0000 mg | ORAL_CAPSULE | Freq: Two times a day (BID) | ORAL | Status: DC
  Administered 2012-04-19 – 2012-04-26 (×14): 2 mg via ORAL
  Filled 2012-04-19 (×16): qty 2

## 2012-04-19 MED ORDER — HEPARIN SODIUM (PORCINE) 5000 UNIT/ML IJ SOLN
5000.0000 [IU] | Freq: Three times a day (TID) | INTRAMUSCULAR | Status: DC
  Administered 2012-04-19 – 2012-04-26 (×20): 5000 [IU] via SUBCUTANEOUS
  Filled 2012-04-19 (×23): qty 1

## 2012-04-19 MED ORDER — ONDANSETRON HCL 4 MG/2ML IJ SOLN: 4.0000 mg | Freq: Four times a day (QID) | INTRAMUSCULAR | Status: DC | PRN

## 2012-04-19 NOTE — ED Notes (Addendum)
Reports SOB X 2-3 weeks, feels like its getting worse-having dyspnea at rest; reports chest heaviness, denies fever, n/v; also reports increased swelling in lower extremities-noted on assessment; reports hx of kidney transplant in 2003

## 2012-04-19 NOTE — H&P (Signed)
Hospital Admission Note Date: 04/19/2012  Patient name: [name] Medical record number: [id number] Date of birth: [birth year] Age: 78 y.o. 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 She reports, that she's been in her usual state of health until 3 weeks ago, when she progressively developed shortness of breath, associated with orthopnea, PND, and swelling of her lower extremities. The shortness of breath has worsened to the extent that she gets short of breath with talking, and turning in bed. She has increased the number of pillows. She needs in the bed from 2 to 4 in the last 2 weeks. She denies history of chest pain, dizziness, or history of falls. She does not report any history of cough, she does not report any history of fevers or chills. She's been following up with her nephrologist. After the kidney transplant, and she's been doing fine. One month ago, she had follow up kidney biopsy due to an increase in her creatinine and biopsy report came out normal. Her creatine level returned down to baseline without any treatment. Her home medications include furosemide 80 mg once daily, amlodipine 10 mg once daily, and metoprolol in addition to mycophenolate and Tacrolimus. She reports, that she's been compliant with her medications. She has not had a history of swelling of her legs in the past.  Review of her records indicate that she had  of left cardiac catheterization with a stent in the right coronary artery which had stenosis of 80% and since then. She has not been following up with her cardiologist, because she's been feeling fine. An echocardiogram performed around the same time showed an EF of 55-65%. She has no echocardiogram since that time.  She has a remote history of cigarette smoking, but quit in the 1970s. She reports history of a mini stroke many years ago without any residual neurological deficits.   Meds: Current Outpatient Rx  Name Route Sig Dispense Refill  . ALLOPURINOL 100 MG PO TABS Oral Take 100 mg by mouth at bedtime.    Marland Kitchen AMLODIPINE BESYLATE 10 MG PO TABS Oral Take 10 mg by mouth daily.    . ASPIRIN EC 81 MG PO TBEC Oral Take 81 mg by mouth daily.    Marland Kitchen CINACALCET HCL 30 MG PO TABS Oral Take 30 mg by mouth every other day.     Marland Kitchen FAMOTIDINE 20 MG PO TABS Oral Take 20 mg by mouth 2 (two) times daily.    . FUROSEMIDE 80 MG PO TABS Oral Take 80 mg by mouth daily.    Marland Kitchen METOPROLOL TARTRATE 50 MG PO TABS Oral Take 100 mg by mouth 2 (two) times daily.    Marland Kitchen MYCOPHENOLATE MOFETIL 250 MG PO CAPS Oral Take 500 mg by mouth 2 (two) times daily.    Marland Kitchen SIMVASTATIN 20 MG PO TABS Oral Take 20 mg by mouth at bedtime.     Marland Kitchen TACROLIMUS 1 MG PO CAPS Oral Take 2 mg  by mouth 2 (two) times daily.       Allergies: Allergies as of 04/19/2012 - Review Complete 04/19/2012  Allergen Reaction Noted  . Sulfa drugs cross reactors Swelling 12/03/2011   Past Medical History  Diagnosis Date  . S/P kidney transplant   . Coronary artery disease   . Hypertension   . Renal disorder    Past Surgical History  Procedure Date  . Kidney transplant   . Abdominal hysterectomy    No family history on file. History   Social History  . Marital Status: Divorced    Spouse Name: N/A    Number of Children: N/A  . Years of Education: N/A   Occupational History  . Not on file.   Social History Main Topics  . Smoking status: Never  Smoker   . Smokeless tobacco: Not on file  . Alcohol Use: No  . Drug Use: No  . Sexually Active:    Other Topics Concern  . Not on file   Social History Narrative  . No narrative on file    Review of Systems: Constitutional: positive for fatigue Eyes: negative Respiratory: negative for asthma, chronic bronchitis, cough, pneumonia and sputum Gastrointestinal: negative for abdominal pain, change in bowel habits, constipation, diarrhea, dyspepsia, melena, nausea and vomiting Genitourinary:negative for dysuria, frequency, hematuria, hesitancy, nocturia and urinary incontinence Musculoskeletal:positive for arthralgias and mainly involving the left knee where she get regular steroid injections. Neurological: negative for dizziness, gait problems, headaches, seizures, speech problems, tremors and weakness Endocrine: positive for History of hyperparathyroidism, secondary to chronic kidney disease.  Physical Exam: Blood pressure 144/53, pulse 61, temperature 97.7 F (36.5 C), temperature source Oral, resp. rate 16, SpO2 96.00%. General appearance: alert, cooperative, appears stated age, fatigued, mild distress and Pleasant elderly woman Head: Normocephalic, without obvious abnormality, atraumatic Eyes: conjunctivae/corneas clear. PERRL, EOM's intact. Fundi benign. Nose: Nares normal. Septum midline. Mucosa normal. No drainage or sinus tenderness. Neck: JVD - 10 cm above sternal notch and thyroid not enlarged, symmetric, no tenderness/mass/nodules Lungs: rales bilaterally and There are crackles up to the level of the mid lung bilaterally. Heart: regular rate and rhythm, S1, S2 normal, no murmur, click, rub or gallop Abdomen: soft, non-tender; bowel sounds normal; no masses,  no organomegaly and There is a well-healed surgical scar in the right lumbar region. Extremities: extremities normal, atraumatic, no cyanosis or edema and edema Bilateral lower extremity edema up to the level of the  knees. Pulses: 2+ and symmetric Skin: Skin color, texture, turgor normal. No rashes or lesions Neurologic: Alert and oriented X 3, normal strength and tone. Normal symmetric reflexes. Normal coordination and gait  Lab results: Basic Metabolic Panel:  Basename 04/19/12 1523  NA 139  K 3.9  CL 103  CO2 25  GLUCOSE 100*  BUN 23  CREATININE 1.40*  CALCIUM 9.9  MG --  PHOS --   Liver Function Tests: No results found for this basename: AST:2,ALT:2,ALKPHOS:2,BILITOT:2,PROT:2,ALBUMIN:2 in the last 72 hours No results found for this basename: LIPASE:2,AMYLASE:2 in the last 72 hours No results found for this basename: AMMONIA:2 in the last 72 hours CBC:  Basename 04/19/12 1526  WBC 6.9  NEUTROABS 3.9  HGB 13.2  HCT 39.7  MCV 89.4  PLT 239   Cardiac Enzymes: No results found for this basename: CKTOTAL:3,CKMB:3,CKMBINDEX:3,TROPONINI:3 in the last 72 hours BNP:  Basename 04/19/12 1523  PROBNP 2455.0*   D-Dimer: No results found for this basename: DDIMER:2 in the last 72 hours CBG: No results found for this  basename: GLUCAP:6 in the last 72 hours Hemoglobin A1C: No results found for this basename: HGBA1C in the last 72 hours Fasting Lipid Panel: No results found for this basename: CHOL,HDL,LDLCALC,TRIG,CHOLHDL,LDLDIRECT in the last 72 hours Thyroid Function Tests: No results found for this basename: TSH,T4TOTAL,FREET4,T3FREE,THYROIDAB in the last 72 hours Anemia Panel: No results found for this basename: VITAMINB12,FOLATE,FERRITIN,TIBC,IRON,RETICCTPCT in the last 72 hours Coagulation: No results found for this basename: LABPROT:2,INR:2 in the last 72 hours Urine Drug Screen: Drugs of Abuse  No results found for this basename: labopia, cocainscrnur, labbenz, amphetmu, thcu, labbarb    Alcohol Level: No results found for this basename: ETH:2 in the last 72 hours Urinalysis: No results found for this basename:  COLORURINE:2,APPERANCEUR:2,LABSPEC:2,PHURINE:2,GLUCOSEU:2,HGBUR:2,BILIRUBINUR:2,KETONESUR:2,PROTEINUR:2,UROBILINOGEN:2,NITRITE:2,LEUKOCYTESUR:2 in the last 72 hours Misc. Labs:   Imaging results:  Dg Chest 2 View  04/19/2012  *RADIOLOGY REPORT*  Clinical Data: Shortness of breath  CHEST - 2 VIEW  Comparison: 12/30/2011  Findings: Heart size is normal.  There are moderate bilateral pleural effusions right greater than left.  Mild interstitial edema is noted.  Plate-like atelectasis noted in the lung bases.  No airspace consolidation identified.  IMPRESSION:  1.  Mild to moderate fluid overload.   Original Report Authenticated By: Rosealee Albee, [name]     Other results: EKG: normal sinus rhythm, sinus bradycardia, with a heart rate of 52 beats per minute  Assessment & Plan by Problem: Ms. Stimmel is a 78 year old woman, with a past medical history of coronary artery disease, status post stent in 2002, status post renal transplant in 2003, secondary to focal sclerosing glomerulonephritis who presents to the ED with progressive shortness of breath and bilateral, lower extremity edema.   Acute CHF: She reports symptoms of orthopnea, PND, and lower extremity edema which has been progressing for the last 3 weeks. Her history significant for coronary artery disease with a stent in the right coronary artery. Examination of the patient is significant for edema up to the level the knees with raised JVD. She also has bilateral crackles posteriorly up to the level of the mid lungs. Possible causes of this is fluid overload from cardiac heart failure. Chest x-ray shows mild to moderate fluid overload. Initial EKG is normal. Cardiac ischemia is an unlikely given negative Troponin. ProBNP is elevated at 2455.  Plan -IV Lasix 80 mg IV twice daily -Admit telemetry -Do echocardiogram.  -Daily weights -Recycle cardiac enzymes-initial troponin x1 is negative - monitor BMP   CAD (coronary artery disease): Status  post right coronary artery stent. She's been treated with simvastatin 20 mg once daily, metoprolol, 50 mg once daily and aspirin 81 mg once daily. She has not been following with her cardiologist for the last 4 years, because she's been feeling fine. -Will consult cardiologist. [[facility]  S/P kidney transplant: Status post kidney transplant in 2003 at [facility]. Prior to the kidney transplant. She had been on hemodialysis. She's been doing well with her treatment. She did well post transplant and she continues with mycophenolate and Tacrolimus. She has a recent increase in creatine in August but the renal bx was negative. She will continue with her medication as usual  Cr 1.4   Hypertension: She uses amlodipine 10 mg once daily, metoprolol, 50 mg twice daily, and furosemide 80 mg once daily. Given her low blood pressures in the ED of 137/47. We will hold his medications as a monitor. Blood pressure.    H/O secondary hyperparathyroidism: To continue with Sensipar     Signed: Zada Girt,  Dameir Gentzler 04/19/2012, 6:38 PM

## 2012-04-19 NOTE — ED Provider Notes (Signed)
Medical screening examination/treatment/procedure(s) were conducted as a shared visit with non-physician practitioner(s) and myself.  I personally evaluated the patient during the encounter   Loren Racer, MD 04/19/12 1840

## 2012-04-19 NOTE — ED Provider Notes (Signed)
History     CSN: [id number]  Arrival date & time 04/19/12  1507   First MD Initiated Contact with Patient 04/19/12 1604      Chief Complaint  Patient presents with  . Shortness of Breath    (Consider location/radiation/quality/duration/timing/severity/associated sxs/prior treatment) HPI Comments: [name] with a progressive 3 week history of worsening shortness of breath.  Initially having shortness of breath with ambulation along with slowly increasing swelling in her bilateral lower extremities, but over the past week has had increasing shortness of breath when supine and also at rest.   She normally sleeps propped on 2 pillows but has increased this to 4 pillows.  She denies fevers or chills, no cough, chest pain or abdominal pain, but describes feeling "heavy in the chest".  She had an elevation in her creatinine to over 2.0 and was seen at [facility] at which time a biopsy of her kidney transplant (from 2003)  has come back normal.  Dr. Briant Cedar follows her for her renal dysfunction. She does not have a pcp.    The history is provided by the patient.    Past Medical History  Diagnosis Date  . S/P kidney transplant   . Coronary artery disease   . Hypertension   . Renal disorder     Past Surgical History  Procedure Date  . Kidney transplant   . Abdominal hysterectomy     No family history on file.  History  Substance Use Topics  . Smoking status: Never Smoker   . Smokeless tobacco: Not on file  . Alcohol Use: No    OB History    Grav Para Term Preterm Abortions TAB SAB Ect Mult Living                  Review of Systems  Constitutional: Negative for fever.  HENT: Negative for congestion, sore throat and neck pain.   Eyes: Negative.   Respiratory: Positive for chest tightness and shortness of breath.   Cardiovascular: Positive for leg swelling. Negative for chest pain.  Gastrointestinal: Negative for nausea, abdominal pain and abdominal distention.    Genitourinary: Negative.   Musculoskeletal: Negative for joint swelling and arthralgias.  Skin: Negative.  Negative for rash and wound.  Neurological: Negative for dizziness, weakness, light-headedness, numbness and headaches.  Hematological: Negative.   Psychiatric/Behavioral: Negative.     Allergies  Sulfa drugs cross reactors  Home Medications   Current Outpatient Rx  Name Route Sig Dispense Refill  . ALLOPURINOL 100 MG PO TABS Oral Take 100 mg by mouth at bedtime.    Marland Kitchen AMLODIPINE BESYLATE 10 MG PO TABS Oral Take 10 mg by mouth daily.    . ASPIRIN EC 81 MG PO TBEC Oral Take 81 mg by mouth daily.    Marland Kitchen CINACALCET HCL 30 MG PO TABS Oral Take 30 mg by mouth every other day.     Marland Kitchen FAMOTIDINE 20 MG PO TABS Oral Take 20 mg by mouth 2 (two) times daily.    . FUROSEMIDE 80 MG PO TABS Oral Take 80 mg by mouth daily.    Marland Kitchen METOPROLOL TARTRATE 50 MG PO TABS Oral Take 100 mg by mouth 2 (two) times daily.    Marland Kitchen MYCOPHENOLATE MOFETIL 250 MG PO CAPS Oral Take 500 mg by mouth 2 (two) times daily.    Marland Kitchen SIMVASTATIN 20 MG PO TABS Oral Take 20 mg by mouth at bedtime.     Marland Kitchen TACROLIMUS 1 MG PO CAPS Oral Take  2 mg by mouth 2 (two) times daily.       BP 137/47  Pulse 58  Temp 97.7 F (36.5 C) (Oral)  Resp 20  SpO2 96%  Physical Exam  Nursing note and vitals reviewed. Constitutional: She appears well-developed and well-nourished.  HENT:  Head: Normocephalic and atraumatic.  Eyes: Conjunctivae normal are normal.  Neck: Normal range of motion.  Cardiovascular: Normal rate, regular rhythm and intact distal pulses.   Pulmonary/Chest: Effort normal and breath sounds normal. She has no wheezes.  Abdominal: Soft. Bowel sounds are normal. There is no tenderness.  Musculoskeletal: Normal range of motion.  Neurological: She is alert.  Skin: Skin is warm and dry.  Psychiatric: She has a normal mood and affect.    ED Course  Procedures (including critical care time)  Labs Reviewed  BASIC METABOLIC  PANEL - Abnormal; Notable for the following:    Glucose, Bld 100 (*)     Creatinine, Ser 1.40 (*)     GFR calc non Af Amer 35 (*)     GFR calc Af Amer 41 (*)     All other components within normal limits  PRO B NATRIURETIC PEPTIDE - Abnormal; Notable for the following:    Pro B Natriuretic peptide (BNP) 2455.0 (*)     All other components within normal limits  CBC WITH DIFFERENTIAL  POCT I-STAT TROPONIN I   Dg Chest 2 View  04/19/2012  *RADIOLOGY REPORT*  Clinical Data: Shortness of breath  CHEST - 2 VIEW  Comparison: 12/30/2011  Findings: Heart size is normal.  There are moderate bilateral pleural effusions right greater than left.  Mild interstitial edema is noted.  Plate-like atelectasis noted in the lung bases.  No airspace consolidation identified.  IMPRESSION:  1.  Mild to moderate fluid overload.   Original Report Authenticated By: Rosealee Albee, [name]      1. CHF (congestive heart failure)     IV started for lasix 80 mg IV,  Also given nitro paste.    MDM  Pt with acute CHF per chest xray, labs and exam findings.  Plan for admission for further management of sx.  Spoke with the [facility] who will see pt in ed.    Date: 04/19/2012  Rate: 54  Rhythm: sinus bradycardia  QRS Axis: normal  Intervals: QT prolonged  ST/T Wave abnormalities: normal  Conduction Disutrbances:none  Narrative Interpretation:   Old EKG Reviewed: unchanged          Burgess Amor, PA 04/19/12 1745

## 2012-04-19 NOTE — ED Notes (Signed)
Pt returned to room from xray.

## 2012-04-19 NOTE — ED Notes (Signed)
Patient  to CDU 7 report to [employer]

## 2012-04-20 DIAGNOSIS — I369 Nonrheumatic tricuspid valve disorder, unspecified: Secondary | ICD-10-CM | POA: Diagnosis not present

## 2012-04-20 DIAGNOSIS — J189 Pneumonia, unspecified organism: Secondary | ICD-10-CM | POA: Diagnosis not present

## 2012-04-20 DIAGNOSIS — I509 Heart failure, unspecified: Secondary | ICD-10-CM | POA: Diagnosis not present

## 2012-04-20 DIAGNOSIS — Z862 Personal history of diseases of the blood and blood-forming organs and certain disorders involving the immune mechanism: Secondary | ICD-10-CM | POA: Diagnosis not present

## 2012-04-20 DIAGNOSIS — I871 Compression of vein: Secondary | ICD-10-CM | POA: Diagnosis not present

## 2012-04-20 DIAGNOSIS — I2510 Atherosclerotic heart disease of native coronary artery without angina pectoris: Secondary | ICD-10-CM | POA: Diagnosis not present

## 2012-04-20 DIAGNOSIS — N2581 Secondary hyperparathyroidism of renal origin: Secondary | ICD-10-CM | POA: Diagnosis not present

## 2012-04-20 DIAGNOSIS — I10 Essential (primary) hypertension: Secondary | ICD-10-CM

## 2012-04-20 DIAGNOSIS — Z940 Kidney transplant status: Secondary | ICD-10-CM | POA: Diagnosis not present

## 2012-04-20 LAB — URINALYSIS, ROUTINE W REFLEX MICROSCOPIC
Glucose, UA: NEGATIVE mg/dL
Nitrite: NEGATIVE
Protein, ur: NEGATIVE mg/dL
pH: 6 (ref 5.0–8.0)

## 2012-04-20 LAB — BASIC METABOLIC PANEL
GFR calc Af Amer: 43 mL/min — ABNORMAL LOW (ref >90)
GFR calc non Af Amer: 37 mL/min — ABNORMAL LOW (ref >90)
Potassium: 3.6 mEq/L (ref 3.5–5.1)
Sodium: 140 mEq/L (ref 135–145)

## 2012-04-20 LAB — CBC
Hemoglobin: 12.1 g/dL (ref 12.0–15.0)
MCHC: 32.8 g/dL (ref 30.0–36.0)
RBC: 4.17 MIL/uL (ref 3.87–5.11)

## 2012-04-20 LAB — TSH: TSH: 2.529 u[IU]/mL (ref 0.350–4.500)

## 2012-04-20 LAB — URINE MICROSCOPIC-ADD ON

## 2012-04-20 NOTE — Progress Notes (Signed)
Utilization Review Completed.  

## 2012-04-20 NOTE — Progress Notes (Signed)
  Echocardiogram 2D Echocardiogram has been performed.  [name] 04/20/2012, 11:31 AM

## 2012-04-20 NOTE — Plan of Care (Signed)
Problem: Phase I Progression Outcomes Goal: EF % per last Echo/documented,Core Reminder form on chart Outcome: Completed/Met Date Met:  04/20/12 EF per echo 9/19 70 %

## 2012-04-20 NOTE — Progress Notes (Addendum)
Subjective:    Interval Events:   [name] is still complaining of shortness of breath and she says that it has remained the same since yesterday. She has no new complaints. She is awaiting to have an echocardiogram and repeat chest x-ray today.   I have reviewed her medical records from Dr. Laurance Flatten office and they indicate that she indeed had a right kidney biopsy on August, 22, 2013, which showed findings consistent with a kidney transplanted 10 years ago. There was no evidence of acute tubular injury. There is a history of her being tried on losartan, but this was discontinued on 03/21/2012 after her creatine increased from her baseline of 1.2 -1.9 to 2.5. She has a followup with [facility] annually. Of note, the patient has had some increased levels of the tacrolimus. Her nephrologist had offered a cardiac stress one month ago but the patient refused it.  She also has a history of pulmonary nodule in the right upper lobe which was seen in May 2013. She is supposed to have a follow up chest x ray. She also had a history of a posterior communicating aneurysm.    Objective:    Vital Signs:   Temp:  [97.3 F (36.3 C)-98.2 F (36.8 C)] 98.2 F (36.8 C) (09/19 0624) Pulse Rate:  [56-61] 60  (09/19 0624) Resp:  [16-20] 20  (09/19 0624) BP: (105-144)/(46-68) 124/46 mmHg (09/19 0624) SpO2:  [93 %-100 %] 100 % (09/19 0624) Weight:  [163 lb 12.8 oz (74.3 kg)-164 lb 10.9 oz (74.7 kg)] 163 lb 12.8 oz (74.3 kg) (09/19 0624) Last BM Date: 04/19/12   Weights: 24-hour Weight change:   Filed Weights   04/19/12 1920 04/20/12 0624  Weight: 164 lb 10.9 oz (74.7 kg) 163 lb 12.8 oz (74.3 kg)     Intake/Output:   Intake/Output Summary (Last 24 hours) at 04/20/12 1352 Last data filed at 04/20/12 0900  Gross per 24 hour  Intake    220 ml  Output    300 ml  Net    -80 ml       Physical Exam: Blood pressure 144/53, pulse 61, temperature 97.7 F (36.5 C), temperature  source Oral, resp. rate 16, SpO2 96.00%.  General appearance: alert, cooperative, appears stated age, fatigued, mild distress and Pleasant elderly woman  Head: Normocephalic, without obvious abnormality, atraumatic  Eyes: conjunctivae/corneas clear. PERRL, EOM's intact. Fundi benign.  Nose: Nares normal. Septum midline. Mucosa normal. No drainage or sinus tenderness.  Neck: JVD - 10 cm above sternal notch and thyroid not enlarged, symmetric, no tenderness/mass/nodules  Lungs: rales bilaterally and There are crackles up to the level of the mid lung bilaterally.  Heart: regular rate and rhythm, S1, S2 normal, no murmur, click, rub or gallop  Abdomen: soft, non-tender; bowel sounds normal; no masses, no organomegaly and There is a well-healed surgical scar in the right lumbar region.  Extremities: extremities normal, atraumatic, no cyanosis or edema and edema Bilateral lower extremity edema up to the level of the knees.  Pulses: 2+ and symmetric  Skin: Skin color, texture, turgor normal. No rashes or lesions  Neurologic: Alert and oriented X 3, normal strength and tone. Normal symmetric reflexes. Normal coordination and gait     Labs: Basic Metabolic Panel:  Lab 04/20/12 1610 04/19/12 1954 04/19/12 1523  NA 140 -- 139  K 3.6 -- 3.9  CL 104 -- 103  CO2 24 -- 25  GLUCOSE 101* -- 100*  BUN 26* -- 23  CREATININE 1.34* 1.33* 1.40*  CALCIUM 9.9 -- 9.9  MG -- 2.0 --  PHOS -- -- --    Liver Function Tests:  Lab 04/19/12 1954  AST 19  ALT 9  ALKPHOS 169*  BILITOT 0.7  PROT 7.9  ALBUMIN 4.2   No results found for this basename: LIPASE:5,AMYLASE:5 in the last 168 hours No results found for this basename: AMMONIA:3 in the last 168 hours  CBC:  Lab 04/20/12 0240 04/19/12 1954 04/19/12 1526  WBC 7.4 8.1 6.9  NEUTROABS -- -- 3.9  HGB 12.1 13.5 13.2  HCT 36.9 40.4 39.7  MCV 88.5 88.6 89.4  PLT 213 262 239    Cardiac Enzymes:  Lab 04/20/12 0240 04/19/12 1953  CKTOTAL -- --    CKMB -- --  CKMBINDEX -- --  TROPONINI <0.30 <0.30    BNP: No components found with this basename: POCBNP:5  CBG: No results found for this basename: GLUCAP:5 in the last 168 hours  Coagulation Studies:  Basename 04/19/12 1954  LABPROT 14.9  INR 1.19    Microbiology: No results found for this or any previous visit.  Other results: EKG Results: 2 EKGs only remarkable for possible infarction in the septal leads of undetermined age.  Imaging: Dg Chest 2 View  04/19/2012  *RADIOLOGY REPORT*  Clinical Data: Shortness of breath  CHEST - 2 VIEW  Comparison: 12/30/2011  Findings: Heart size is normal.  There are moderate bilateral pleural effusions right greater than left.  Mild interstitial edema is noted.  Plate-like atelectasis noted in the lung bases.  No airspace consolidation identified.  IMPRESSION:  1.  Mild to moderate fluid overload.   Original Report Authenticated By: Rosealee Albee, [name]       Medications:    Infusions:     Scheduled Medications:    . allopurinol  100 mg Oral QHS  . amLODipine  10 mg Oral Daily  . aspirin EC  81 mg Oral Daily  . cinacalcet  30 mg Oral Q48H  . famotidine  20 mg Oral BID  . furosemide  80 mg Intravenous Once  . furosemide  80 mg Intravenous BID  . heparin  5,000 Units Subcutaneous Q8H  . mycophenolate  500 mg Oral BID  . nitroGLYCERIN  0.5 inch Topical Once  . simvastatin  20 mg Oral QHS  . sodium chloride  3 mL Intravenous Q12H  . tacrolimus  2 mg Oral BID  . DISCONTD: furosemide  80 mg Intravenous Q12H     PRN Medications: sodium chloride, acetaminophen, acetaminophen, morphine injection, nitroGLYCERIN, ondansetron (ZOFRAN) IV, ondansetron, sodium chloride   Assessment/ Plan:    [name] is a 78 year old woman, with a past medical history of coronary artery disease, status post stent in 2002, status post renal transplant in 2003, secondary to focal sclerosing glomerulonephritis who presents to the ED with  progressive shortness of breath and bilateral, lower extremity edema.   Acute CHF: She reports symptoms of orthopnea, PND, and lower extremity edema which has been progressing for the last 3 weeks. Her history significant for coronary artery disease with a stent in the right coronary artery. Examination of the patient is significant for edema up to the level the knees with raised JVD. She also has bilateral crackles posteriorly up to the level of the mid lungs. Possible causes of this is fluid overload from cardiac heart failure. Chest x-ray shows mild to moderate fluid overload. Initial EKG is normal. Cardiac ischemia is an unlikely given negative Troponin.  ProBNP is elevated at 2455.  Plan  -IV Lasix 80 mg IV twice daily  -Admit telemetry  -Do echocardiogram - verbal report indicated EF of 70%, but with some diastolic dysfunction. She also has ventricular hypertrophy.  -Daily weights  -Strict ins and outs -Recycle cardiac enzymes-initial troponin x3  negative  - monitor BMP twice daily  CAD (coronary artery disease): Status post right coronary artery stent. She's been treated with simvastatin 20 mg once daily, metoprolol, 50 mg once daily and aspirin 81 mg once daily. The metoprolol has been withheld given acute CHF. She will continue with the statin and aspirin. She has not been following with her cardiologist for the last 4 years, because she's been feeling fine.  -Will consult cardiologist. [[facility]  S/P kidney transplant: Status post kidney transplant in 2003 at [facility]. Prior to the kidney transplant. She had been on hemodialysis. She's been doing well with her treatment. She did well post transplant and she continues with mycophenolate and Tacrolimus. She has a recent increase in creatine in August but the renal bx was negative after her creatinine went up to 2.5.. Her baseline creatinine has indicated in her records from her nephrologist is around 1.4-1.9.     Hypertension: She uses amlodipine 10 mg once daily, metoprolol, 50 mg twice daily, and furosemide 80 mg once daily. Given her low blood pressures in the ED of 137/47.  - she will continue with her amlodipine   H/O secondary hyperparathyroidism: To continue with Sensipar     Length of Stay: 1 days   Signed by:  Dow Adolph PGY-I, Internal Medicine Pager [phone] 04/20/2012, 1:52 PM

## 2012-04-20 NOTE — H&P (Signed)
Internal Medicine Teaching Service Attending Note Date: 04/20/2012  Patient name: [name]  Medical record number: [id number]  Date of birth: 10-21-1931   I have seen and evaluated Unknown Foley and discussed their care with the Residency Team.  Ms. Sabina reports that ~ 1 month ago, after having a kidney biopsy, she experienced ~ 1 week of fatigue and lethargy.  She denied any other symptoms at the time except for decreased appetite and PO intake.  She is not sure why she was feeling so poorly.  She had no sick contacts. ~ 3 weeks ago she noticed increased shortness of breath.  The SOB progressed to the point where she was getting SOB at rest and also experiencing increasing pillow orthopnea and PND.  She further reports increased LE swelling.  She has had this before, however, it was usually only when on her feet for a long time and would resolve with rest.  This swelling came on at rest and has not improved.  She reports being compliant with all of her medications including lasix and metoprolol.  She has a history of CAD with a stent placed in the RCA in 2002 due to unstable angina.  She has a TTE which was performed many years ago which revealed a normal EF.  She has not followed up with a Cardiologist.  Further symptoms include decreased urination. She denies, chest pain or dizziness, fever or chills.    Of note, about a month ago, she had an unexplained rise in creatinine.  She is a kidney transplant recipient.  She had a kidney biopsy which reportedly did not reveal any cause for the acute rise, and her Cr subsequently improved.  However, around the time of this kidney biopsy was when her symptoms started.    She is a nonsmoker, quit smoking in the 1970s.   For a full med list, please refer to Dr. Huntley Dec note.   She is allergic to sulfa.   Physical Exam: Blood pressure 124/46, pulse 60, temperature 98.2 F (36.8 C), temperature source Oral, resp. rate 20, height 5\' 5"  (1.651 m), weight  163 lb 12.8 oz (74.3 kg), SpO2 100.00%. General appearance: alert, cooperative, fatigued and no distress Head: Normocephalic, without obvious abnormality, atraumatic Eyes: conjunctivae/corneas clear. PERRL, EOM's intact. Fundi benign., glasses in place Neck: JVD - 10 cm above sternal notch and supple, symmetrical, trachea midline Lungs: Good air movement in upper lung fields, crackles to mid lung fields, no wheezing Heart: regular rate and rhythm, S1, S2 normal, no murmur, click, rub or gallop and No S3 noted Extremities: edema 1+ to mid shin, no wounds or rash Pulses: 2+ and symmetric Skin: Skin color, texture, turgor normal. No rashes or lesions  Lab results: Results for orders placed during the hospital encounter of 04/19/12 (from the past 24 hour(s))  BASIC METABOLIC PANEL     Status: Abnormal   Collection Time   04/19/12  3:23 PM      Component Value Range   Sodium 139  135 - 145 mEq/L   Potassium 3.9  3.5 - 5.1 mEq/L   Chloride 103  96 - 112 mEq/L   CO2 25  19 - 32 mEq/L   Glucose, Bld 100 (*) 70 - 99 mg/dL   BUN 23  6 - 23 mg/dL   Creatinine, Ser 7.82 (*) 0.50 - 1.10 mg/dL   Calcium 9.9  8.4 - 95.6 mg/dL   GFR calc non Af Amer 35 (*) >90 mL/min  GFR calc Af Amer 41 (*) >90 mL/min  PRO B NATRIURETIC PEPTIDE     Status: Abnormal   Collection Time   04/19/12  3:23 PM      Component Value Range   Pro B Natriuretic peptide (BNP) 2455.0 (*) 0 - 450 pg/mL  CBC WITH DIFFERENTIAL     Status: Normal   Collection Time   04/19/12  3:26 PM      Component Value Range   WBC 6.9  4.0 - 10.5 K/uL   RBC 4.44  3.87 - 5.11 MIL/uL   Hemoglobin 13.2  12.0 - 15.0 g/dL   HCT 81.1  91.4 - 78.2 %   MCV 89.4  78.0 - 100.0 fL   MCH 29.7  26.0 - 34.0 pg   MCHC 33.2  30.0 - 36.0 g/dL   RDW 95.6  21.3 - 08.6 %   Platelets 239  150 - 400 K/uL   Neutrophils Relative 56  43 - 77 %   Neutro Abs 3.9  1.7 - 7.7 K/uL   Lymphocytes Relative 29  12 - 46 %   Lymphs Abs 2.0  0.7 - 4.0 K/uL   Monocytes  Relative 10  3 - 12 %   Monocytes Absolute 0.7  0.1 - 1.0 K/uL   Eosinophils Relative 5  0 - 5 %   Eosinophils Absolute 0.3  0.0 - 0.7 K/uL   Basophils Relative 0  0 - 1 %   Basophils Absolute 0.0  0.0 - 0.1 K/uL  POCT I-STAT TROPONIN I     Status: Normal   Collection Time   04/19/12  3:45 PM      Component Value Range   Troponin i, poc 0.04  0.00 - 0.08 ng/mL   Comment 3           TROPONIN I     Status: Normal   Collection Time   04/19/12  7:53 PM      Component Value Range   Troponin I <0.30  <0.30 ng/mL  CBC     Status: Normal   Collection Time   04/19/12  7:54 PM      Component Value Range   WBC 8.1  4.0 - 10.5 K/uL   RBC 4.56  3.87 - 5.11 MIL/uL   Hemoglobin 13.5  12.0 - 15.0 g/dL   HCT 57.8  46.9 - 62.9 %   MCV 88.6  78.0 - 100.0 fL   MCH 29.6  26.0 - 34.0 pg   MCHC 33.4  30.0 - 36.0 g/dL   RDW 52.8  41.3 - 24.4 %   Platelets 262  150 - 400 K/uL  CREATININE, SERUM     Status: Abnormal   Collection Time   04/19/12  7:54 PM      Component Value Range   Creatinine, Ser 1.33 (*) 0.50 - 1.10 mg/dL   GFR calc non Af Amer 37 (*) >90 mL/min   GFR calc Af Amer 43 (*) >90 mL/min  HEPATIC FUNCTION PANEL     Status: Abnormal   Collection Time   04/19/12  7:54 PM      Component Value Range   Total Protein 7.9  6.0 - 8.3 g/dL   Albumin 4.2  3.5 - 5.2 g/dL   AST 19  0 - 37 U/L   ALT 9  0 - 35 U/L   Alkaline Phosphatase 169 (*) 39 - 117 U/L   Total Bilirubin 0.7  0.3 - 1.2 mg/dL  Bilirubin, Direct 0.2  0.0 - 0.3 mg/dL   Indirect Bilirubin 0.5  0.3 - 0.9 mg/dL  MAGNESIUM     Status: Normal   Collection Time   04/19/12  7:54 PM      Component Value Range   Magnesium 2.0  1.5 - 2.5 mg/dL  PROTIME-INR     Status: Normal   Collection Time   04/19/12  7:54 PM      Component Value Range   Prothrombin Time 14.9  11.6 - 15.2 seconds   INR 1.19  0.00 - 1.49  APTT     Status: Normal   Collection Time   04/19/12  7:54 PM      Component Value Range   aPTT 34  24 - 37 seconds  TSH      Status: Normal   Collection Time   04/19/12  7:54 PM      Component Value Range   TSH 2.529  0.350 - 4.500 uIU/mL  TROPONIN I     Status: Normal   Collection Time   04/20/12  2:40 AM      Component Value Range   Troponin I <0.30  <0.30 ng/mL  BASIC METABOLIC PANEL     Status: Abnormal   Collection Time   04/20/12  2:40 AM      Component Value Range   Sodium 140  135 - 145 mEq/L   Potassium 3.6  3.5 - 5.1 mEq/L   Chloride 104  96 - 112 mEq/L   CO2 24  19 - 32 mEq/L   Glucose, Bld 101 (*) 70 - 99 mg/dL   BUN 26 (*) 6 - 23 mg/dL   Creatinine, Ser 4.09 (*) 0.50 - 1.10 mg/dL   Calcium 9.9  8.4 - 81.1 mg/dL   GFR calc non Af Amer 37 (*) >90 mL/min   GFR calc Af Amer 43 (*) >90 mL/min  CBC     Status: Normal   Collection Time   04/20/12  2:40 AM      Component Value Range   WBC 7.4  4.0 - 10.5 K/uL   RBC 4.17  3.87 - 5.11 MIL/uL   Hemoglobin 12.1  12.0 - 15.0 g/dL   HCT 91.4  78.2 - 95.6 %   MCV 88.5  78.0 - 100.0 fL   MCH 29.0  26.0 - 34.0 pg   MCHC 32.8  30.0 - 36.0 g/dL   RDW 21.3  08.6 - 57.8 %   Platelets 213  150 - 400 K/uL    Imaging results:  Dg Chest 2 View  04/19/2012  *RADIOLOGY REPORT*  Clinical Data: Shortness of breath  CHEST - 2 VIEW  Comparison: 12/30/2011  Findings: Heart size is normal.  There are moderate bilateral pleural effusions right greater than left.  Mild interstitial edema is noted.  Plate-like atelectasis noted in the lung bases.  No airspace consolidation identified.  IMPRESSION:  1.  Mild to moderate fluid overload.   Original Report Authenticated By: Rosealee Albee, [name]     Assessment and Plan: I agree with the formulated Assessment and Plan with the following changes:   Ms. Preisler is a 78yo woman with a PMH of CAD, renal transplant, HTN.  She presented in what appears to be acute heart failure.   1. Acute heart failure - TTE to evaluate for systolic vs. Diastolic disease - Lasix 80mg  IV BID until UOP and weight improved - Daily weights,  strict I&O's - EKG revealed possible old  ischemia in the septum, but no acute changes - Telemetry - Monitor BMP closely while on lasix - Possible etiologies include hypertensive cardiomyopathy, systolic heart failure from CAD and possibly a viral cardiomyopathy with the patient's history of fatigue, etc prior to SOB occuring - CXR in the AM  2. CAD - Patient on Statin, beta blocker (held) and aspirin - Unclear why she is not on an Ace-I, could be due to kidney transplant.  The team will need to investigate this further  3. H/O Kidney Tranplant - Monitor BMP closely while on IV diuretics, Cr is probably close to baseline around 1.3 - Continue mycophenlate and tacrolimus  4. HTN - Will continue to treat with amlodipine.  - IV lasix - Beta blocker held  Signed  [place], Chibuike Fleek   Inez Catalina, MD 9/19/201311:18 AM

## 2012-04-21 ENCOUNTER — Inpatient Hospital Stay (HOSPITAL_COMMUNITY): Payer: Medicare Other

## 2012-04-21 DIAGNOSIS — R0602 Shortness of breath: Secondary | ICD-10-CM | POA: Diagnosis not present

## 2012-04-21 DIAGNOSIS — R918 Other nonspecific abnormal finding of lung field: Secondary | ICD-10-CM | POA: Diagnosis not present

## 2012-04-21 DIAGNOSIS — J9819 Other pulmonary collapse: Secondary | ICD-10-CM | POA: Diagnosis not present

## 2012-04-21 DIAGNOSIS — I509 Heart failure, unspecified: Secondary | ICD-10-CM | POA: Diagnosis not present

## 2012-04-21 DIAGNOSIS — J90 Pleural effusion, not elsewhere classified: Secondary | ICD-10-CM | POA: Diagnosis not present

## 2012-04-21 LAB — BASIC METABOLIC PANEL
Calcium: 9.5 mg/dL (ref 8.4–10.5)
Creatinine, Ser: 1.27 mg/dL — ABNORMAL HIGH (ref 0.50–1.10)
GFR calc non Af Amer: 39 mL/min — ABNORMAL LOW (ref >90)
Glucose, Bld: 130 mg/dL — ABNORMAL HIGH (ref 70–99)
Sodium: 141 mEq/L (ref 135–145)

## 2012-04-21 MED ORDER — POTASSIUM CHLORIDE CRYS ER 20 MEQ PO TBCR
40.0000 meq | EXTENDED_RELEASE_TABLET | Freq: Once | ORAL | Status: AC
  Administered 2012-04-21: 40 meq via ORAL
  Filled 2012-04-21: qty 2

## 2012-04-21 MED ORDER — DOCUSATE SODIUM 100 MG PO CAPS
100.0000 mg | ORAL_CAPSULE | Freq: Two times a day (BID) | ORAL | Status: DC | PRN
  Administered 2012-04-21: 100 mg via ORAL
  Filled 2012-04-21: qty 1

## 2012-04-21 MED ORDER — PIPERACILLIN-TAZOBACTAM 3.375 G IVPB
3.3750 g | Freq: Three times a day (TID) | INTRAVENOUS | Status: DC
  Administered 2012-04-21 – 2012-04-25 (×12): 3.375 g via INTRAVENOUS
  Filled 2012-04-21 (×14): qty 50

## 2012-04-21 MED ORDER — METOPROLOL TARTRATE 50 MG PO TABS
50.0000 mg | ORAL_TABLET | Freq: Two times a day (BID) | ORAL | Status: DC
  Administered 2012-04-21 – 2012-04-26 (×11): 50 mg via ORAL
  Filled 2012-04-21 (×12): qty 1

## 2012-04-21 MED ORDER — DEXTROSE 5 % IV SOLN
500.0000 mg | INTRAVENOUS | Status: DC
  Administered 2012-04-21 – 2012-04-22 (×2): 500 mg via INTRAVENOUS
  Filled 2012-04-21 (×3): qty 500

## 2012-04-21 MED ORDER — DEXTROSE 5 % IV SOLN
1.0000 g | INTRAVENOUS | Status: DC
  Filled 2012-04-21: qty 10

## 2012-04-21 NOTE — Progress Notes (Signed)
Internal Medicine Attending  Date: 04/21/2012  Patient name: LAMYAH DURLING Medical record number: [id number] Date of birth: [birth year] Age: 78 y.o. Gender: female  I saw and evaluated the patient. I reviewed the resident's note by Dr. Zada Girt and I agree with the resident's findings and plans as documented in his note.   Ms. Raudenbush presented with what appeared to be acute heart failure, with LE edema, SOB and bilateral pleural effusions, however, her TTE showed preserved EF and diastolic dysfunction (heart failure with preserved EF - HFPEF).  Her symptoms seem out of proportion to this diagnosis and she has not responded to IV lasix therapy very much.  She has had inadequate cancer screening in the past, and an apparent history of a lung nodule that has not been worked up.  (Though Xrays here do not show a prominent nodule).    Will get a chest CT for further evaluation.  Would consider thoracentesis, though patient is hesitant about the invasiveness of the procedure.    Await results from CT, gentle diuresis if needed due to patients h/o kidney transplant.   Signed  Criselda Peaches, Shannel Zahm

## 2012-04-21 NOTE — Progress Notes (Signed)
Subjective:    Interval Events:   [name] is still complaining of shortness of breath has improved but not 'a whole lot'. She has been on IV lasix for two days. Family history is not significant for cancer. However, 3 of her brothers died from heart attacks. She last had a mammogram around 5 years ago and it was normal. She has never had a colonoscopy but she does not complain any bloody stool or change in bowel habits. She has not had a bowel movement since admission. Further history shows that she has lost about 10 pounds over the last 1 year. She had a hysterectomy at age 68.     Objective:    Vital Signs:   Temp:  [97.3 F (36.3 C)-98.4 F (36.9 C)] 98.4 F (36.9 C) (09/20 0845) Pulse Rate:  [66-83] 70  (09/20 0845) Resp:  [16-20] 16  (09/20 0845) BP: (133-148)/(52-89) 136/52 mmHg (09/20 0845) SpO2:  [93 %-100 %] 100 % (09/20 0845) Weight:  [161 lb 9.6 oz (73.3 kg)] 161 lb 9.6 oz (73.3 kg) (09/20 0602) Last BM Date: 04/19/12   Weights: 24-hour Weight change: -3 lb 1.4 oz (-1.4 kg)  Filed Weights   04/19/12 1920 04/20/12 0624 04/21/12 0602  Weight: 164 lb 10.9 oz (74.7 kg) 163 lb 12.8 oz (74.3 kg) 161 lb 9.6 oz (73.3 kg)     Intake/Output:   Intake/Output Summary (Last 24 hours) at 04/21/12 1125 Last data filed at 04/21/12 0900  Gross per 24 hour  Intake    460 ml  Output    400 ml  Net     60 ml       Physical Exam: Blood pressure 144/53, pulse 61, temperature 97.7 F (36.5 C), temperature source Oral, resp. rate 16, SpO2 96.00%.  General appearance: alert, cooperative, appears stated age, fatigued, mild distress and Pleasant elderly woman  Head: Normocephalic, without obvious abnormality, atraumatic  Eyes: conjunctivae/corneas clear. PERRL, EOM's intact. Fundi benign.  Nose: Nares normal. Septum midline. Mucosa normal. No drainage or sinus tenderness.  Neck: JVD - 10 cm above sternal notch and thyroid not enlarged, symmetric, no  tenderness/mass/nodules  Lungs: rales bilaterally and There are crackles up to the level of the mid lung bilaterally.  Heart: regular rate and rhythm, S1, S2 normal, no murmur, click, rub or gallop  Abdomen: soft, non-tender; bowel sounds normal; no masses, no organomegaly and There is a well-healed surgical scar in the right lumbar region.  Extremities: extremities normal, atraumatic, no cyanosis or edema and edema Bilateral lower extremity edema up to the level of the knees.  Pulses: 2+ and symmetric  Skin: Skin color, texture, turgor normal. No rashes or lesions  Neurologic: Alert and oriented X 3, normal strength and tone. Normal symmetric reflexes. Normal coordination and gait   Rectal exam: Normal findings. The stool is brown, without any blood. The rectum is full with stool of normal consistence. Fecal occult blood test has been ordered.  Labs: Basic Metabolic Panel:  Lab 04/21/12 0272 04/20/12 0240 04/19/12 1954 04/19/12 1523  NA 141 140 -- 139  K 3.3* 3.6 -- 3.9  CL 104 104 -- 103  CO2 24 24 -- 25  GLUCOSE 130* 101* -- 100*  BUN 22 26* -- 23  CREATININE 1.27* 1.34* 1.33* 1.40*  CALCIUM 9.5 9.9 -- 9.9  MG -- -- 2.0 --  PHOS -- -- -- --    Liver Function Tests:  Lab 04/19/12 1954  AST 19  ALT 9  ALKPHOS 169*  BILITOT 0.7  PROT 7.9  ALBUMIN 4.2   No results found for this basename: LIPASE:5,AMYLASE:5 in the last 168 hours No results found for this basename: AMMONIA:3 in the last 168 hours  CBC:  Lab 04/20/12 0240 04/19/12 1954 04/19/12 1526  WBC 7.4 8.1 6.9  NEUTROABS -- -- 3.9  HGB 12.1 13.5 13.2  HCT 36.9 40.4 39.7  MCV 88.5 88.6 89.4  PLT 213 262 239    Cardiac Enzymes:  Lab 04/20/12 0240 04/19/12 1953  CKTOTAL -- --  CKMB -- --  CKMBINDEX -- --  TROPONINI <0.30 <0.30    BNP: No components found with this basename: POCBNP:5  CBG: No results found for this basename: GLUCAP:5 in the last 168 hours  Coagulation Studies:  Basename 04/19/12 1954   LABPROT 14.9  INR 1.19    Microbiology: No results found for this or any previous visit.  Other results: EKG Results: 2 EKGs only remarkable for possible infarction in the septal leads of undetermined age.  Imaging: Dg Chest 2 View  04/21/2012  *RADIOLOGY REPORT*  Clinical Data: Follow-up, short of breath, smoking history  CHEST - 2 VIEW  Comparison: Chest x-ray of 04/19/2012  Findings: Bibasilar opacities remain consistent with basilar atelectasis and bilateral effusions.  Mild cardiomegaly is stable. No bony abnormality is seen.  IMPRESSION: Little change in bibasilar atelectasis and bilateral effusions.   Original Report Authenticated By: Juline Patch, [name]    Dg Chest 2 View  04/19/2012  *RADIOLOGY REPORT*  Clinical Data: Shortness of breath  CHEST - 2 VIEW  Comparison: 12/30/2011  Findings: Heart size is normal.  There are moderate bilateral pleural effusions right greater than left.  Mild interstitial edema is noted.  Plate-like atelectasis noted in the lung bases.  No airspace consolidation identified.  IMPRESSION:  1.  Mild to moderate fluid overload.   Original Report Authenticated By: Rosealee Albee, [name]       Medications:    Infusions:     Scheduled Medications:    . allopurinol  100 mg Oral QHS  . aspirin EC  81 mg Oral Daily  . cinacalcet  30 mg Oral Q48H  . famotidine  20 mg Oral BID  . heparin  5,000 Units Subcutaneous Q8H  . metoprolol tartrate  50 mg Oral BID  . mycophenolate  500 mg Oral BID  . simvastatin  20 mg Oral QHS  . sodium chloride  3 mL Intravenous Q12H  . tacrolimus  2 mg Oral BID  . DISCONTD: amLODipine  10 mg Oral Daily  . DISCONTD: furosemide  80 mg Intravenous BID     PRN Medications: sodium chloride, acetaminophen, acetaminophen, morphine injection, nitroGLYCERIN, ondansetron (ZOFRAN) IV, ondansetron, sodium chloride   Assessment/ Plan:    [name] is a 78 year old woman, with a past medical history of coronary artery  disease, status post stent in 2002, status post renal transplant in 2003, secondary to focal sclerosing glomerulonephritis who presents to the ED with progressive shortness of breath and bilateral, lower extremity edema.   Acute CHF: She reports symptoms of orthopnea, PND, and lower extremity edema which has been progressing for the last 3 weeks. Her history significant for coronary artery disease with a stent in the right coronary artery. Examination of the patient is significant for edema up to the level the knees with raised JVD. She also has bilateral crackles posteriorly up to the level of the mid lungs. Possible causes of this is fluid overload from  cardiac heart failure. Chest x-ray shows mild to moderate fluid overload. Initial EKG is normal. Cardiac ischemia is an unlikely given negative Troponin. ProBNP is elevated at 2455. A repeat chest x-ray shows increased pulmonary effusion on the right side compared to the one that she had 2 days ago. Plan  -Discontinue IV Lasix given unremarkable response with worsening of chest x-ray pleural fluid. There is a possibility of other causes of her pleural effusions. We have considered doing a thoracocentesis, and spent about 30 minutes explaining the benefits, risks of pleural fluid tapping and analysis. We have explained to her that the procedure will likely improve her symptoms and also help [place] in finding the cause of her SOB.  But the patient refused the procedure, and request to spend the weekend so that she can discuss this with her family. Her cousin had a lung puncture from similar procedure and she is terrified. We have explained to her that while she is contemplating, her symptoms of shortness of breath are likely to worsen over the weekend.  -Do echocardiogram - EF of 70%, but with some diastolic dysfunction and mild hypertrophy. Increased LV filling pressure -Daily weights  -Strict ins and outs -Recycle cardiac enzymes-initial troponin x3  negative    - monitor BMP twice daily - FOBT - brown stool without blood. Sent to the lab. - Will consider chest CT without contrast   CAD (coronary artery disease): Status post right coronary artery stent. She's been treated with simvastatin 20 mg once daily, metoprolol, 50 mg once daily and aspirin 81 mg once daily. The metoprolol has been withheld given acute CHF. She will continue with the statin and aspirin. She has not been following with her cardiologist for the last 4 years, because she's been feeling fine.    S/P kidney transplant: Status post kidney transplant in 2003 at [facility]. Prior to the kidney transplant. She had been on hemodialysis. She's been doing well with her treatment. She did well post transplant and she continues with mycophenolate and Tacrolimus. She has a recent increase in creatine in August but the renal bx was negative after her creatinine went up to 2.5.. Her baseline creatinine has indicated in her records from her nephrologist is around 1.4-1.9.    Hypertension: She uses amlodipine 10 mg once daily, metoprolol, 50 mg twice daily, and furosemide 80 mg once daily. Given her low blood pressures in the ED of 137/47.  - she will continue with her amlodipine   Pulmonary nodule: Review of her records indicate that in May 2013 she was found to have a right upper lobe nodule. This nodule has never been followed up since then. Review of the x-rays he, at [facility] by the radiologist did not confirm this nodule.  H/O secondary hyperparathyroidism: To continue with Sensipar   Disposition: Will monitor symptoms for another 1-2 days as she decided about thoracocentesis.  Length of Stay: 2 days   Signed by:  Dow Adolph PGY-I, Internal Medicine Pager [phone] 04/21/2012, 11:25 AM

## 2012-04-21 NOTE — Progress Notes (Addendum)
ANTIBIOTIC CONSULT NOTE - INITIAL  Pharmacy Consult for Rocephin and Azithromycin Indication: pneumonia  Allergies  Allergen Reactions  . Sulfa Drugs Cross Reactors Swelling    Patient Measurements: Height: 5\' 5"  (165.1 cm) Weight: 161 lb 9.6 oz (73.3 kg) IBW/kg (Calculated) : 57   Vital Signs: Temp: 98 F (36.7 C) (09/20 1434) Temp src: Oral (09/20 1434) BP: 133/57 mmHg (09/20 1434) Pulse Rate: 66  (09/20 1434) Intake/Output from previous day: 09/19 0701 - 09/20 0700 In: 660 [P.O.:660] Out: 800 [Urine:800] Intake/Output from this shift:    Labs:  Basename 04/21/12 0902 04/20/12 0240 04/19/12 1954 04/19/12 1526  WBC -- 7.4 8.1 6.9  HGB -- 12.1 13.5 13.2  PLT -- 213 262 239  LABCREA -- -- -- --  CREATININE 1.27* 1.34* 1.33* --   Estimated Creatinine Clearance: 36.6 ml/min (by C-G formula based on Cr of 1.27). No results found for this basename: VANCOTROUGH:2,VANCOPEAK:2,VANCORANDOM:2,GENTTROUGH:2,GENTPEAK:2,GENTRANDOM:2,TOBRATROUGH:2,TOBRAPEAK:2,TOBRARND:2,AMIKACINPEAK:2,AMIKACINTROU:2,AMIKACIN:2, in the last 72 hours   Microbiology: No results found for this or any previous visit (from the past 720 hour(s)).  Medical History: Past Medical History  Diagnosis Date  . S/P kidney transplant   . Coronary artery disease   . Hypertension   . Renal disorder     Medications:  Prescriptions prior to admission  Medication Sig Dispense Refill  . allopurinol (ZYLOPRIM) 100 MG tablet Take 100 mg by mouth at bedtime.      Marland Kitchen amLODipine (NORVASC) 10 MG tablet Take 10 mg by mouth daily.      Marland Kitchen aspirin EC 81 MG tablet Take 81 mg by mouth daily.      . cinacalcet (SENSIPAR) 30 MG tablet Take 30 mg by mouth every other day.       . famotidine (PEPCID) 20 MG tablet Take 20 mg by mouth 2 (two) times daily.      . furosemide (LASIX) 80 MG tablet Take 80 mg by mouth daily.      . metoprolol (LOPRESSOR) 50 MG tablet Take 100 mg by mouth 2 (two) times daily.      . mycophenolate  (CELLCEPT) 250 MG capsule Take 500 mg by mouth 2 (two) times daily.      . simvastatin (ZOCOR) 20 MG tablet Take 20 mg by mouth at bedtime.       . tacrolimus (PROGRAF) 1 MG capsule Take 2 mg by mouth 2 (two) times daily.        Assessment: 78 yo lady to start ceftriaxone and azithromycin for CAP.  Goal of Therapy:  Eradication of infection  Plan:  Azithromycin 500 mg IV q24 hours. Rocephin 1 gm IV q24 hours.  Seay, Lora Poteet 04/21/2012,7:15 PM  Addum:  Change rocephine to zosyn in pt who is immunosuppressed.  Start zosyn 3.375 gm IV q8 hours.   9/21  Pharmacy to sign off. Thank you. Okey Regal, PharmD

## 2012-04-22 DIAGNOSIS — I509 Heart failure, unspecified: Secondary | ICD-10-CM | POA: Diagnosis not present

## 2012-04-22 LAB — BASIC METABOLIC PANEL
BUN: 25 mg/dL — ABNORMAL HIGH (ref 6–23)
Calcium: 9.8 mg/dL (ref 8.4–10.5)
Creatinine, Ser: 1.42 mg/dL — ABNORMAL HIGH (ref 0.50–1.10)
GFR calc non Af Amer: 34 mL/min — ABNORMAL LOW (ref >90)
Glucose, Bld: 106 mg/dL — ABNORMAL HIGH (ref 70–99)
Sodium: 136 mEq/L (ref 135–145)

## 2012-04-22 MED ORDER — COLCHICINE 0.6 MG PO TABS
0.6000 mg | ORAL_TABLET | Freq: Two times a day (BID) | ORAL | Status: AC
  Administered 2012-04-22 – 2012-04-24 (×4): 0.6 mg via ORAL
  Filled 2012-04-22 (×4): qty 1

## 2012-04-22 NOTE — Progress Notes (Signed)
Internal Medicine Attending  Date: 04/22/2012  Patient name: [name] Medical record number: [id number] Date of birth: [birth year] Age: 78 y.o. Gender: female  I saw and evaluated the patient. I reviewed the resident's note by Dr. Dierdre Searles and I agree with the resident's findings and plans as documented in her note.  Signed  Criselda Peaches, Ladiamond Gallina

## 2012-04-22 NOTE — Progress Notes (Signed)
Subjective: Doing slightly better than yesterday. Reports left great toe gout " flare up" Remain afebrile. No acute event overnight  Objective: Vital signs in last 24 hours: Filed Vitals:   04/21/12 1434 04/21/12 2132 04/22/12 0448 04/22/12 1503  BP: 133/57 142/47 135/59 146/71  Pulse: 66 66 66 68  Temp: 98 F (36.7 C) 98.5 F (36.9 C) 98.8 F (37.1 C) 97.2 F (36.2 C)  TempSrc: Oral Oral Oral Oral  Resp: 16 18 17 18   Height:      Weight:   165 lb 5.5 oz (75 kg)   SpO2: 100% 96% 93% 91%   Weight change: 3 lb 12 oz (1.7 kg)  Intake/Output Summary (Last 24 hours) at 04/22/12 1704 Last data filed at 04/22/12 1504  Gross per 24 hour  Intake    830 ml  Output    775 ml  Net     55 ml   General: NAD Neck: bilateral JVD up to the Jaw Lungs: Bibasilar lung sounds diminished with a few rales noted. No wheezing or rhonchi noted Chest: Multiple dilated veins noted on Upper chest area Heart: RRR. No M/G/R Abd: Soft, BS x 4 LE: B/L trace edema. Left MTP erythema, swelling, tender to touch, slightly warm  Lab Results: Basic Metabolic Panel:  Lab 04/22/12 1610 04/21/12 0902 04/19/12 1954  [name] 136 141 --  K 4.4 3.3* --  CL 102 104 --  CO2 26 24 --  GLUCOSE 106* 130* --  BUN 25* 22 --  CREATININE 1.42* 1.27* --  CALCIUM 9.8 9.5 --  MG -- -- 2.0  PHOS -- -- --   Liver Function Tests:  Lab 04/19/12 1954  AST 19  ALT 9  ALKPHOS 169*  BILITOT 0.7  PROT 7.9  ALBUMIN 4.2   CBC:  Lab 04/20/12 0240 04/19/12 1954 04/19/12 1526  WBC 7.4 8.1 --  NEUTROABS -- -- 3.9  HGB 12.1 13.5 --  HCT 36.9 40.4 --  MCV 88.5 88.6 --  PLT 213 262 --   Cardiac Enzymes:  Lab 04/20/12 0240 04/19/12 1953  CKTOTAL -- --  CKMB -- --  CKMBINDEX -- --  TROPONINI <0.30 <0.30   BNP:  Lab 04/19/12 1523  PROBNP 2455.0*   Thyroid Function Tests:  Lab 04/19/12 1954  TSH 2.529  T4TOTAL --  FREET4 --  T3FREE --  THYROIDAB --   Coagulation:  Lab 04/19/12 1954  LABPROT 14.9  INR  1.19   Urinalysis:  Lab 04/20/12 1240  COLORURINE YELLOW  LABSPEC 1.013  PHURINE 6.0  GLUCOSEU NEGATIVE  HGBUR NEGATIVE  BILIRUBINUR NEGATIVE  KETONESUR NEGATIVE  PROTEINUR NEGATIVE  UROBILINOGEN 1.0  NITRITE NEGATIVE  LEUKOCYTESUR TRACE*    Micro Results: No results found for this or any previous visit (from the past 240 hour(s)). Studies/Results: Dg Chest 2 View  04/21/2012  *RADIOLOGY REPORT*  Clinical Data: Follow-up, short of breath, smoking history  CHEST - 2 VIEW  Comparison: Chest x-ray of 04/19/2012  Findings: Bibasilar opacities remain consistent with basilar atelectasis and bilateral effusions.  Mild cardiomegaly is stable. No bony abnormality is seen.  IMPRESSION: Little change in bibasilar atelectasis and bilateral effusions.   Original Report Authenticated By: Juline Patch, [name]    Ct Chest Wo Contrast  04/21/2012  *RADIOLOGY REPORT*  Clinical Data: Bilateral pleural effusions.  CT CHEST WITHOUT CONTRAST  Technique:  Multidetector CT imaging of the chest was performed following the standard protocol without IV contrast.  Comparison: Chest x-ray 04/21/2012.  No prior  chest CTs.  Findings:  Mediastinum: Heart size is mildly enlarged. There is no significant pericardial fluid, thickening or pericardial calcification. There is atherosclerosis of the thoracic aorta, the great vessels of the mediastinum and the coronary arteries, including calcified atherosclerotic plaque in the  left main, left anterior descending, left circumflex and right coronary arteries. Numerous borderline enlarged mediastinal lymph nodes are conspicuous in number rather than size. No pathologically enlarged mediastinal or hilar lymph nodes. Please note that accurate exclusion of hilar adenopathy is limited on noncontrast CT scans.  Esophagus is unremarkable in appearance. There are some faint calcifications within the superior vena cava, which could suggest the presence of chronic thrombus or chronic  calcified fibrin sheath.  Lungs/Pleura: There are bilateral pleural effusions layering dependently which are simple in appearance on this noncontrast examination.  These are associated with areas of atelectasis throughout the lung bases bilaterally, with some potential air space consolidation in the anterior and lateral aspect of the right lower lobe where there are multiple air bronchograms in a region of partial collapse and poor aeration. There are two small pulmonary nodules, one of which is only 2 mm in the periphery of the left lower lobe (image 38 of series 3), while the other is 5 mm associated with the minor fissure (image 29 of series 3).  No other larger more suspicious appearing pulmonary nodules or masses are otherwise identified.  Upper Abdomen: Unremarkable.  Musculoskeletal: There are numerous venous collaterals throughout the anterior chest wall bilaterally. There are no aggressive appearing lytic or blastic lesions noted in the visualized portions of the skeleton.  IMPRESSION: 1.  Small bilateral pleural effusions with areas of passive atelectasis throughout the lung bases bilaterally, in addition to an area of probable airspace consolidation in the anterior and lateral basal segments of the right lower lobe, concerning for pneumonia. 2.  Calcifications in the superior vena cava.  Given the presence of the numerous venous collaterals throughout the chest wall, these calcifications are suspicious for potential chronic calcified thrombus or calcified fibrin sheath.  Clinical correlation for signs and symptoms of SVC syndrome is recommended. 3. Atherosclerosis, including left main and three-vessel coronary artery disease. 4.  Tiny pulmonary nodules, as above, the largest of which is 5 mm associated with the minor fissure. If the patient is at high risk for bronchogenic carcinoma, follow-up chest CT at 6-12 months is recommended.  If the patient is at low risk for bronchogenic carcinoma, follow-up  chest CT at 12 months is recommended.  This recommendation follows the consensus statement: Guidelines for Management of Small Pulmonary Nodules Detected on CT Scans: A Statement from the Fleischner Society as published in Radiology 2005; 237:395-400.   Original Report Authenticated By: Florencia Reasons, [name]    Medications: I have reviewed the patient's current medications. Scheduled Meds:   . aspirin EC  81 mg Oral Daily  . azithromycin  500 mg Intravenous Q24H  . cinacalcet  30 mg Oral Q48H  . colchicine  0.6 mg Oral BID  . famotidine  20 mg Oral BID  . heparin  5,000 Units Subcutaneous Q8H  . metoprolol tartrate  50 mg Oral BID  . mycophenolate  500 mg Oral BID  . piperacillin-tazobactam (ZOSYN)  IV  3.375 g Intravenous Q8H  . potassium chloride  40 mEq Oral Once  . simvastatin  20 mg Oral QHS  . sodium chloride  3 mL Intravenous Q12H  . tacrolimus  2 mg Oral BID  . DISCONTD: allopurinol  100  mg Oral QHS  . DISCONTD: cefTRIAXone (ROCEPHIN)  IV  1 g Intravenous Q24H   Continuous Infusions:  PRN Meds:.sodium chloride, acetaminophen, acetaminophen, docusate sodium, morphine injection, nitroGLYCERIN, ondansetron (ZOFRAN) IV, ondansetron, sodium chloride Assessment/Plan:  # Dyspnea/Orthopnea/PND, likely PNA   Patient presents with above symptoms with B/L P. Effusion which did not respond to IV diuretics well. Echo confirmed EF of 70% with diastolic dysfunction and only mild dilated left atrium. Less likely acute CHF.  chest CT indicated ? RLL PNA, which may be the etiology of her SOB even though she remains afebrile with no leukocytosis, which may not present given her current immunosuppressant tx s/p renal transplant.   - Oxygen - broaden spectrum  ABX started to cover strep, GNR, Pseudomonas ( immunosuppressant) and atypical   - follow up on clinical manifestation  - will need cancer screening as outpatient. FOBT negative. Negative breast and lymph node exam. She refused  thoracentesis.    # Acute gout    History of gout always on left MTP. On Allopurinol now. She reports last flareup was 1-2 years ago. The clinical pic is consistent with acute flareup.  - allopurinol stopped - avoid NSAIDs. She does not want to take oral steroids - renal Dr. Kathrene Bongo called and agrees with short course of Colchicine 0.6 BID x 4 doses - follow up on Cr  # HTN   On metoprolol 50 mg po BID  - Amlodipine D/C'd since it may increase risk of Tacrolimus toxicity  - may consider titrate the BB for BP  # Superior vena cava syndrome    B/L JVD along veinous dilatation on upper chest. Chest CT indicated calcification in SVC, the etiology is likely chronic calcified thrombus or calcified fibrin sheath, which is probably associated with her previous HD   - explained her prominent JVD, Thus, the likelihood of acute CHF is lower.   # Pulmonary nodules, incidental finding on CXR. - will need repeat chest CT in 6 months given history of smoking   # history of CAD with stent to RCA , stable  # s/p kidney transplant in 2003 On Cellcept and tacrolimus. She has a recent increase in creatine in August but the renal bx was negative after her creatinine went up to 2.5.. Her baseline creatinine has indicated in her records from her nephrologist is around 1.4-1.9.   - Tacrolimus Level pending - follow up BMP  # VTE: Heparin   LOS: 3 days   [name] 04/22/2012, 5:04 PM

## 2012-04-23 DIAGNOSIS — I509 Heart failure, unspecified: Secondary | ICD-10-CM | POA: Diagnosis not present

## 2012-04-23 LAB — BASIC METABOLIC PANEL
BUN: 24 mg/dL — ABNORMAL HIGH (ref 6–23)
CO2: 21 mEq/L (ref 19–32)
Chloride: 101 mEq/L (ref 96–112)
Creatinine, Ser: 1.43 mg/dL — ABNORMAL HIGH (ref 0.50–1.10)
Glucose, Bld: 99 mg/dL (ref 70–99)

## 2012-04-23 MED ORDER — AZITHROMYCIN 500 MG PO TABS
500.0000 mg | ORAL_TABLET | Freq: Every day | ORAL | Status: DC
  Administered 2012-04-23 – 2012-04-24 (×2): 500 mg via ORAL
  Filled 2012-04-23 (×3): qty 1

## 2012-04-23 NOTE — Progress Notes (Addendum)
Internal Medicine Attending  Date: 04/23/2012  Patient name: [name] Medical record number: [id number] Date of birth: [birth year] Age: 78 y.o. Gender: female  I saw and evaluated the patient. I reviewed the patients case with the resident team and I agree with the resident's findings and plans as discussed on rounds.   [name]' SOB is improving with treatment with antibiotics.  Her gout is improved.  Attempting short course of colchicine with close monitoring of renal function.   Please see housestaff note for further information regarding plan.   Signed  Criselda Peaches, EMILY

## 2012-04-23 NOTE — Progress Notes (Signed)
Subjective:    Interval Events:   Patient is feeling much better today. Her breathing is much improved and she appears bright. Her main complaint is pain in the left ankle due to gout.    Objective:    Vital Signs:   Temp:  [97.8 F (36.6 C)-98.6 F (37 C)] 97.8 F (36.6 C) (09/22 1320) Pulse Rate:  [67-71] 67  (09/22 1320) Resp:  [18-20] 19  (09/22 1320) BP: (135-150)/(68-76) 146/74 mmHg (09/22 1320) SpO2:  [94 %-100 %] 94 % (09/22 1320) Weight:  [166 lb 8 oz (75.524 kg)] 166 lb 8 oz (75.524 kg) (09/22 0444) Last BM Date: 04/21/12   Weights: 24-hour Weight change: 1 lb 2.5 oz (0.524 kg)  Filed Weights   04/21/12 0602 04/22/12 0448 04/23/12 0444  Weight: 161 lb 9.6 oz (73.3 kg) 165 lb 5.5 oz (75 kg) 166 lb 8 oz (75.524 kg)     Intake/Output:   Intake/Output Summary (Last 24 hours) at 04/23/12 2056 Last data filed at 04/23/12 1700  Gross per 24 hour  Intake   1003 ml  Output    900 ml  Net    103 ml       Physical Exam: Blood pressure 144/53, pulse 61, temperature 97.7 F (36.5 C), temperature source Oral, resp. rate 16, SpO2 96.00%.  General appearance: alert, cooperative, appears stated age, fatigued, mild distress and Pleasant elderly woman  Head: Normocephalic, without obvious abnormality, atraumatic  Eyes: conjunctivae/corneas clear. PERRL, EOM's intact. Fundi benign.  Nose: Nares normal. Septum midline. Mucosa normal. No drainage or sinus tenderness.  Neck: JVD - 10 cm above sternal notch and thyroid not enlarged, symmetric, no tenderness/mass/nodules  Lungs: rales bilaterally and There are crackles up to the level of the mid lung bilaterally.  Heart: regular rate and rhythm, S1, S2 normal, no murmur, click, rub or gallop  Abdomen: soft, non-tender; bowel sounds normal; no masses, no organomegaly and There is a well-healed surgical scar in the right lumbar region.  Extremities: extremities normal, atraumatic, no cyanosis or edema and edema Bilateral  lower extremity edema up to the level of the knees.  Pulses: 2+ and symmetric  Skin: Skin color, texture, turgor normal. No rashes or lesions  Neurologic: Alert and oriented X 3, normal strength and tone. Normal symmetric reflexes. Normal coordination and gait   Rectal exam: Normal findings. The stool is brown, without any blood. The rectum is full with stool of normal consistence. Fecal occult blood test has been ordered.  Labs: Basic Metabolic Panel:  Lab 04/23/12 1610 04/22/12 0555 04/21/12 0902 04/20/12 0240 04/19/12 1954 04/19/12 1523  NA 136 136 141 140 -- 139  K 4.8 4.4 3.3* 3.6 -- 3.9  CL 101 102 104 104 -- 103  CO2 21 26 24 24  -- 25  GLUCOSE 99 106* 130* 101* -- 100*  BUN 24* 25* 22 26* -- 23  CREATININE 1.43* 1.42* 1.27* 1.34* 1.33* --  CALCIUM 10.0 9.8 9.5 -- -- --  MG -- -- -- -- 2.0 --  PHOS -- -- -- -- -- --    Liver Function Tests:  Lab 04/19/12 1954  AST 19  ALT 9  ALKPHOS 169*  BILITOT 0.7  PROT 7.9  ALBUMIN 4.2   No results found for this basename: LIPASE:5,AMYLASE:5 in the last 168 hours No results found for this basename: AMMONIA:3 in the last 168 hours  CBC:  Lab 04/20/12 0240 04/19/12 1954 04/19/12 1526  WBC 7.4 8.1 6.9  NEUTROABS -- --  3.9  HGB 12.1 13.5 13.2  HCT 36.9 40.4 39.7  MCV 88.5 88.6 89.4  PLT 213 262 239    Cardiac Enzymes:  Lab 04/20/12 0240 04/19/12 1953  CKTOTAL -- --  CKMB -- --  CKMBINDEX -- --  TROPONINI <0.30 <0.30    BNP: No components found with this basename: POCBNP:5  CBG: No results found for this basename: GLUCAP:5 in the last 168 hours  Coagulation Studies: No results found for this basename: LABPROT:5,INR:5 in the last 72 hours  Microbiology: Results for orders placed during the hospital encounter of 04/19/12  CULTURE, BLOOD (ROUTINE X 2)     Status: Normal (Preliminary result)   Collection Time   04/21/12  7:08 PM      Component Value Range Status Comment   Specimen Description BLOOD RIGHT ARM    Final    Special Requests BOTTLES DRAWN AEROBIC AND ANAEROBIC 10CC   Final    Culture  Setup Time 04/22/2012 02:39   Final    Culture     Final    Value:        BLOOD CULTURE RECEIVED NO GROWTH TO DATE CULTURE WILL BE HELD FOR 5 DAYS BEFORE ISSUING A FINAL NEGATIVE REPORT   Report Status PENDING   Incomplete   CULTURE, BLOOD (ROUTINE X 2)     Status: Normal (Preliminary result)   Collection Time   04/21/12  7:25 PM      Component Value Range Status Comment   Specimen Description BLOOD RIGHT HAND   Final    Special Requests BOTTLES DRAWN AEROBIC AND ANAEROBIC 10CC   Final    Culture  Setup Time 04/22/2012 02:39   Final    Culture     Final    Value:        BLOOD CULTURE RECEIVED NO GROWTH TO DATE CULTURE WILL BE HELD FOR 5 DAYS BEFORE ISSUING A FINAL NEGATIVE REPORT   Report Status PENDING   Incomplete     Other results: EKG Results: 2 EKGs only remarkable for possible infarction in the septal leads of undetermined age.  Imaging: No results found.    Medications:    Infusions:     Scheduled Medications:    . aspirin EC  81 mg Oral Daily  . azithromycin  500 mg Oral QHS  . cinacalcet  30 mg Oral Q48H  . colchicine  0.6 mg Oral BID  . famotidine  20 mg Oral BID  . heparin  5,000 Units Subcutaneous Q8H  . metoprolol tartrate  50 mg Oral BID  . mycophenolate  500 mg Oral BID  . piperacillin-tazobactam (ZOSYN)  IV  3.375 g Intravenous Q8H  . simvastatin  20 mg Oral QHS  . sodium chloride  3 mL Intravenous Q12H  . tacrolimus  2 mg Oral BID  . DISCONTD: azithromycin  500 mg Intravenous Q24H     PRN Medications: sodium chloride, acetaminophen, acetaminophen, docusate sodium, morphine injection, nitroGLYCERIN, ondansetron (ZOFRAN) IV, ondansetron, sodium chloride   Assessment/ Plan:    [name] is a 78 year old woman, with a past medical history of coronary artery disease, status post stent in 2002, status post renal transplant in 2003, secondary to focal sclerosing  glomerulonephritis who presents to the ED with progressive shortness of breath and bilateral, lower extremity edema.   :Dyspnea/Orthopnea/PND, likely PNA  Patient presents with above symptoms with B/L P. Effusion which did not respond to IV diuretics well. Echo confirmed EF of 70% with diastolic dysfunction and only  mild dilated left atrium. Less likely acute CHF.  chest CT indicated ? RLL PNA, which may be the etiology of her SOB even though she remains afebrile with no leukocytosis, which may not present given her current immunosuppressant tx s/p renal transplant. She is improving. This is day 2 antibiotics with Zosyn and Azithromycin - Oxygen as needed - broaden spectrum ABX started to cover strep, GNR, Pseudomonas ( immunosuppressant) - follow up on clinical manifestation  - will need cancer screening as outpatient. FOBT negative. Negative breast and lymph node exam. She refused thoracentesis  Acute gout  History of gout always on left MTP. On Allopurinol now. She reports last flareup was 1-2 years ago.  The clinical pic is consistent with acute flareup.  - allopurinol d/ced  - avoid NSAIDs. She does not want to take oral steroids  - Colchicine 0.6 BID x 4 doses  - follow up on Cr  S/P kidney transplant: Status post kidney transplant in 2003 at [facility]. Prior to the kidney transplant. She had been on hemodialysis. She's been doing well with her treatment. She did well post transplant and she continues with mycophenolate and Tacrolimus. She has a recent increase in creatine in August but the renal bx was negative after her creatinine went up to 2.5.. Her baseline creatinine has indicated in her records from her nephrologist is around 1.4-1.9.   Plan Will monitor creatinine a Awaiting Tacrolimus levels  Hypertension: She uses amlodipine 10 mg once daily, metoprolol, 50 mg twice daily, and furosemide 80 mg once daily. Given her low blood pressures in the ED of 137/47.  - Amlodipine  d/ced since it increased Tacrolimus toxicity -will treat with Metroprolol  CAD (coronary artery disease): Status post right coronary artery stent. She's been treated with simvastatin 20 mg once daily, metoprolol, 50 mg once daily and aspirin 81 mg once daily. The metoprolol has been withheld given acute CHF. She will continue with the statin and aspirin. She has not been following with her cardiologist for the last 4 years, because she's been feeling fine.  Plan - Stable.   Superior vena cava syndrome  B/L JVD along veinous dilatation on upper chest. 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  Pulmonary nodule: Review of her records indicate that in May 2013 she was found to have a right upper lobe nodule. This nodule has never been followed up since then. Review of the x-rays he, at [facility] by the radiologist did not confirm this nodule. -f/u with 6 month ct scan  H/O secondary hyperparathyroidism: To continue with Sensipar   Disposition: Will monitor symptoms for another 1-2 days as she decided about thoracocentesis.  VTE: Lovenox  Length of Stay: 4 days   Signed by:  Dow Adolph PGY-I, Internal Medicine Pager [phone] 04/23/2012, 8:56 PM

## 2012-04-24 DIAGNOSIS — I871 Compression of vein: Secondary | ICD-10-CM | POA: Diagnosis not present

## 2012-04-24 DIAGNOSIS — I509 Heart failure, unspecified: Secondary | ICD-10-CM | POA: Diagnosis not present

## 2012-04-24 DIAGNOSIS — J189 Pneumonia, unspecified organism: Secondary | ICD-10-CM | POA: Diagnosis not present

## 2012-04-24 DIAGNOSIS — Z940 Kidney transplant status: Secondary | ICD-10-CM | POA: Diagnosis not present

## 2012-04-24 DIAGNOSIS — N2581 Secondary hyperparathyroidism of renal origin: Secondary | ICD-10-CM | POA: Diagnosis not present

## 2012-04-24 LAB — BASIC METABOLIC PANEL
CO2: 23 mEq/L (ref 19–32)
Calcium: 10.3 mg/dL (ref 8.4–10.5)
Creatinine, Ser: 1.34 mg/dL — ABNORMAL HIGH (ref 0.50–1.10)
Glucose, Bld: 132 mg/dL — ABNORMAL HIGH (ref 70–99)
Sodium: 137 mEq/L (ref 135–145)

## 2012-04-24 MED ORDER — FUROSEMIDE 80 MG PO TABS
80.0000 mg | ORAL_TABLET | Freq: Every day | ORAL | Status: DC
  Administered 2012-04-24 – 2012-04-26 (×3): 80 mg via ORAL
  Filled 2012-04-24 (×3): qty 1

## 2012-04-24 MED ORDER — COLCHICINE 0.6 MG PO TABS
0.6000 mg | ORAL_TABLET | Freq: Two times a day (BID) | ORAL | Status: DC
  Administered 2012-04-24 – 2012-04-26 (×4): 0.6 mg via ORAL
  Filled 2012-04-24 (×6): qty 1

## 2012-04-24 NOTE — Clinical Documentation Improvement (Signed)
GENERIC DOCUMENTATION CLARIFICATION QUERY  THIS DOCUMENT IS NOT A PERMANENT PART OF THE MEDICAL RECORD   Please update your documentation within the medical record to reflect your response to this query.                                                                                         04/24/12   Dr. Zada Girt and/or Associates,  In a better effort to capture your patient's severity of illness, reflect appropriate length of stay and utilization of resources, a review of the patient medical record has revealed the following indicators:  "chest CT indicated ? RLL PNA, which may be the etiology of her SOB even though she remains afebrile with no leukocytosis, which may not present given her current immunosuppressant tx s/p renal transplant broaden spectrum ABX started to cover strep, GNR, Pseudomonas ( immunosuppressant) and atypical"  LI, NA  04/22/2012, 5:04 PM Progress Note  "chest CT indicated ? RLL PNA, which may be the etiology of her SOB even though she remains afebrile with no leukocytosis, which may not present given her current immunosuppressant tx s/p renal transplant. She is improving. This is day 2 antibiotics with Zosyn and Azithromycin" Dow Adolph  PGY-I, Internal Medicine  Pager [phone]  04/23/2012, 8:56 PM Progress Note   Based on your clinical judgment, please document in the progress notes and discharge summary if "strep, GNR, Pseudomonas ( immunosuppressant) and atypical" are "Possible", "Probable", or "Likely" causes of the patient's right lower lobe pneumonia requiring treatment with Zosyn and Azithromycin.   In responding to this query please exercise your independent judgment.    The fact that a query is asked, does not imply that any particular answer is desired or expected.   Reviewed: additional documentation in the medical record  Thank You,  Jerral Ralph  RN BSN CCDS Certified Clinical Documentation Specialist: Cell    [phone]  Health Information Management [place]  TO RESPOND TO THE THIS QUERY, FOLLOW THE INSTRUCTIONS BELOW:  1. If needed, update documentation for the patient's encounter via the notes activity.  2. Access this query again and click edit on the In [employer].  3. After updating, or not, click F2 to complete all highlighted (required) fields concerning your review. Select "additional documentation in the medical record" OR "no additional documentation provided".  4. Click Sign note button.  5. The deficiency will fall out of your In Basket *Please let us know if you are not able to complete this workflow by phone or e-mail (listed below).   Diagnosis:  Hospital acquired Pneumonia. (Pseudomonas and atypical organisms possible).

## 2012-04-24 NOTE — Progress Notes (Signed)
Pt's HR 40's per monitor.  BP 140/62 P 68, Dr.  Bryson Corona notified.  Order to do and EKG and will be on the floor to check it.   Will continue to monitor.  Earnest Thalman,RN

## 2012-04-24 NOTE — Progress Notes (Signed)
Internal Medicine Attending  Date: 04/24/2012  Patient name: [name] Medical record number: [id number] Date of birth: Mar 11, 1932 Age: 78 y.o. Gender: female  I saw and evaluated the patient. I reviewed the resident's note by Dr. Zada Girt and I agree with the resident's findings and plans as documented in his note.   Signed  Criselda Peaches, EMILY

## 2012-04-24 NOTE — Progress Notes (Signed)
Subjective:    Interval Events:   The patient reports to be feeling worse compared to yesterday with increased shortness of breath. However, the gout pain is reducing. Her weight has also increased by 2 pounds from 164-166 over the last 4 days..       Objective:    Vital Signs:   Temp:  [97.8 F (36.6 C)-98.6 F (37 C)] 97.8 F (36.6 C) (09/22 1320) Pulse Rate:  [67-71] 67  (09/22 1320) Resp:  [18-20] 19  (09/22 1320) BP: (135-150)/(68-76) 146/74 mmHg (09/22 1320) SpO2:  [94 %-100 %] 94 % (09/22 1320) Weight:  [166 lb 8 oz (75.524 kg)] 166 lb 8 oz (75.524 kg) (09/22 0444) Last BM Date: 04/21/12   Weights: 24-hour Weight change: 1 lb 2.5 oz (0.524 kg)  Filed Weights   04/21/12 0602 04/22/12 0448 04/23/12 0444  Weight: 161 lb 9.6 oz (73.3 kg) 165 lb 5.5 oz (75 kg) 166 lb 8 oz (75.524 kg)     Intake/Output:   Intake/Output Summary (Last 24 hours) at 04/23/12 2056 Last data filed at 04/23/12 1700  Gross per 24 hour  Intake   1003 ml  Output    900 ml  Net    103 ml       Physical Exam: Blood pressure 144/53, pulse 61, temperature 97.7 F (36.5 C), temperature source Oral, resp. rate 16, SpO2 96.00%.  General appearance: alert, cooperative, appears stated age, fatigued, mild distress and Pleasant elderly woman  Head: Normocephalic, without obvious abnormality, atraumatic  Eyes: conjunctivae/corneas clear. PERRL, EOM's intact. Fundi benign.  Nose: Nares normal. Septum midline. Mucosa normal. No drainage or sinus tenderness.  Neck: JVD - 10 cm above sternal notch and thyroid not enlarged, symmetric, no tenderness/mass/nodules  Lungs: rales bilaterally and There are crackles up to the level of the mid lung bilaterally.  Heart: regular rate and rhythm, S1, S2 normal, no murmur, click, rub or gallop  Abdomen: soft, non-tender; bowel sounds normal; no masses, no organomegaly and There is a well-healed surgical scar in the right lumbar region.  Extremities: extremities  normal, atraumatic, no cyanosis or edema and edema Bilateral lower extremity edema up to the level of the knees.  Pulses: 2+ and symmetric  Skin: Skin color, texture, turgor normal. No rashes or lesions  Neurologic: Alert and oriented X 3, normal strength and tone. Normal symmetric reflexes. Normal coordination and gait   Rectal exam: Normal findings. The stool is brown, without any blood. The rectum is full with stool of normal consistence. Fecal occult blood test has been ordered.  Labs: Basic Metabolic Panel:  Lab 04/23/12 1610 04/22/12 0555 04/21/12 0902 04/20/12 0240 04/19/12 1954 04/19/12 1523  NA 136 136 141 140 -- 139  K 4.8 4.4 3.3* 3.6 -- 3.9  CL 101 102 104 104 -- 103  CO2 21 26 24 24  -- 25  GLUCOSE 99 106* 130* 101* -- 100*  BUN 24* 25* 22 26* -- 23  CREATININE 1.43* 1.42* 1.27* 1.34* 1.33* --  CALCIUM 10.0 9.8 9.5 -- -- --  MG -- -- -- -- 2.0 --  PHOS -- -- -- -- -- --    Liver Function Tests:  Lab 04/19/12 1954  AST 19  ALT 9  ALKPHOS 169*  BILITOT 0.7  PROT 7.9  ALBUMIN 4.2   No results found for this basename: LIPASE:5,AMYLASE:5 in the last 168 hours No results found for this basename: AMMONIA:3 in the last 168 hours  CBC:  Lab 04/20/12 0240 04/19/12  1954 04/19/12 1526  WBC 7.4 8.1 6.9  NEUTROABS -- -- 3.9  HGB 12.1 13.5 13.2  HCT 36.9 40.4 39.7  MCV 88.5 88.6 89.4  PLT 213 262 239    Cardiac Enzymes:  Lab 04/20/12 0240 04/19/12 1953  CKTOTAL -- --  CKMB -- --  CKMBINDEX -- --  TROPONINI <0.30 <0.30    BNP: No components found with this basename: POCBNP:5  CBG: No results found for this basename: GLUCAP:5 in the last 168 hours  Coagulation Studies: No results found for this basename: LABPROT:5,INR:5 in the last 72 hours  Microbiology: Results for orders placed during the hospital encounter of 04/19/12  CULTURE, BLOOD (ROUTINE X 2)     Status: Normal (Preliminary result)   Collection Time   04/21/12  7:08 PM      Component Value  Range Status Comment   Specimen Description BLOOD RIGHT ARM   Final    Special Requests BOTTLES DRAWN AEROBIC AND ANAEROBIC 10CC   Final    Culture  Setup Time 04/22/2012 02:39   Final    Culture     Final    Value:        BLOOD CULTURE RECEIVED NO GROWTH TO DATE CULTURE WILL BE HELD FOR 5 DAYS BEFORE ISSUING A FINAL NEGATIVE REPORT   Report Status PENDING   Incomplete   CULTURE, BLOOD (ROUTINE X 2)     Status: Normal (Preliminary result)   Collection Time   04/21/12  7:25 PM      Component Value Range Status Comment   Specimen Description BLOOD RIGHT HAND   Final    Special Requests BOTTLES DRAWN AEROBIC AND ANAEROBIC 10CC   Final    Culture  Setup Time 04/22/2012 02:39   Final    Culture     Final    Value:        BLOOD CULTURE RECEIVED NO GROWTH TO DATE CULTURE WILL BE HELD FOR 5 DAYS BEFORE ISSUING A FINAL NEGATIVE REPORT   Report Status PENDING   Incomplete     Other results: EKG Results: 2 EKGs only remarkable for possible infarction in the septal leads of undetermined age.  Imaging: No results found.    Medications:    Infusions:     Scheduled Medications:    . aspirin EC  81 mg Oral Daily  . azithromycin  500 mg Oral QHS  . cinacalcet  30 mg Oral Q48H  . colchicine  0.6 mg Oral BID  . famotidine  20 mg Oral BID  . heparin  5,000 Units Subcutaneous Q8H  . metoprolol tartrate  50 mg Oral BID  . mycophenolate  500 mg Oral BID  . piperacillin-tazobactam (ZOSYN)  IV  3.375 g Intravenous Q8H  . simvastatin  20 mg Oral QHS  . sodium chloride  3 mL Intravenous Q12H  . tacrolimus  2 mg Oral BID  . DISCONTD: azithromycin  500 mg Intravenous Q24H     PRN Medications: sodium chloride, acetaminophen, acetaminophen, docusate sodium, morphine injection, nitroGLYCERIN, ondansetron (ZOFRAN) IV, ondansetron, sodium chloride   Assessment/ Plan:    [name] is a 78 year old woman, with a past medical history of coronary artery disease, status post stent in 2002, status  post renal transplant in 2003, secondary to focal sclerosing glomerulonephritis who presents to the ED with progressive shortness of breath and bilateral, lower extremity edema.   Dyspnea/Orthopnea/PND, likely PNA  Patient presents with above symptoms with B/L P. Effusion which did not respond to IV  diuretics well. Echo confirmed EF of 70% with diastolic dysfunction and only mild dilated left atrium. Less likely acute CHF.  chest CT indicated ? RLL PNA, which may be the etiology of her SOB even though she remains afebrile with no leukocytosis, which may not present given her current immunosuppressant tx s/p renal transplant. She is improving. antibiotics with Zosyn and Azithromycin started on 9/21 - Oxygen as needed - broaden spectrum ABX started to cover strep, GNR, Pseudomonas ( immunosuppressant) - follow up on clinical manifestation  - will need cancer screening as outpatient. FOBT negative. Negative breast and lymph node exam. She refused thoracentesis -Restarted on her home Lasix at 80 mg once daily by mouth since this worsening of shortness of breath might be related to fluid overload..  Acute gout  History of gout always on left MTP. On Allopurinol now. She reports last flareup was 1-2 years ago.  The clinical pic is consistent with acute flareup.  - allopurinol d/ced  - avoid NSAIDs. She does not want to take oral steroids  - more Colchicine 0.6 BID  - follow up on Cr  S/P kidney transplant: Status post kidney transplant in 2003 at [facility]. Prior to the kidney transplant. She had been on hemodialysis. She's been doing well with her treatment. She did well post transplant and she continues with mycophenolate and Tacrolimus. She has a recent increase in creatine in August but the renal bx was negative after her creatinine went up to 2.5.. Her baseline creatinine has indicated in her records from her nephrologist is around 1.4-1.9.   Plan Will monitor creatinine Awaiting Tacrolimus  levels  Hypertension: She uses amlodipine 10 mg once daily, metoprolol, 50 mg twice daily, and furosemide 80 mg once daily. Given her low blood pressures in the ED of 137/47.  - Amlodipine d/ced since it increased Tacrolimus toxicity -will treat with Metroprolol  CAD (coronary artery disease): Status post right coronary artery stent. She's been treated with simvastatin 20 mg once daily, metoprolol, 50 mg once daily and aspirin 81 mg once daily. The metoprolol has been withheld given acute CHF. She will continue with the statin and aspirin. She has not been following with her cardiologist for the last 4 years, because she's been feeling fine.  Plan - Stable.   Superior vena cava syndrome  B/L JVD along veinous dilatation on upper chest. 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  Pulmonary nodule: Review of her records indicate that in May 2013 she was found to have a right upper lobe nodule. This nodule has never been followed up since then. Review of the x-rays he, at [facility] by the radiologist did not confirm this nodule. -f/u with 6 month ct scan  H/O secondary hyperparathyroidism: To continue with Sensipar   Disposition: Will monitor symptoms and consider discharge tomorrow  VTE: Lovenox  Length of Stay: 4 days   Signed by:  Dow Adolph PGY-I, Internal Medicine Pager [phone] 04/23/2012, 8:56 PM

## 2012-04-25 ENCOUNTER — Inpatient Hospital Stay (HOSPITAL_COMMUNITY): Payer: Medicare Other

## 2012-04-25 DIAGNOSIS — J90 Pleural effusion, not elsewhere classified: Secondary | ICD-10-CM | POA: Diagnosis not present

## 2012-04-25 DIAGNOSIS — J9819 Other pulmonary collapse: Secondary | ICD-10-CM | POA: Diagnosis not present

## 2012-04-25 DIAGNOSIS — R0602 Shortness of breath: Secondary | ICD-10-CM | POA: Diagnosis not present

## 2012-04-25 DIAGNOSIS — I509 Heart failure, unspecified: Secondary | ICD-10-CM | POA: Diagnosis not present

## 2012-04-25 LAB — BASIC METABOLIC PANEL
BUN: 19 mg/dL (ref 6–23)
GFR calc non Af Amer: 39 mL/min — ABNORMAL LOW (ref >90)
Glucose, Bld: 93 mg/dL (ref 70–99)
Potassium: 4.1 mEq/L (ref 3.5–5.1)

## 2012-04-25 MED ORDER — LEVOFLOXACIN 750 MG PO TABS
750.0000 mg | ORAL_TABLET | Freq: Every day | ORAL | Status: DC
  Administered 2012-04-25 – 2012-04-26 (×2): 750 mg via ORAL
  Filled 2012-04-25 (×2): qty 1

## 2012-04-25 NOTE — Progress Notes (Signed)
Internal Medicine Attending  Date: 04/25/2012  Patient name: [name] Medical record number: [id number] Date of birth: 1931/08/16 Age: 78 y.o. Gender: female  I saw and evaluated the patient. I reviewed the resident's note by Dr. Zada Girt and I agree with the resident's findings and plans as documented in his note.  Patient daily improving.  Restart home lasix.  Possible discharge tomorrow.   Signed  Criselda Peaches, EMILY

## 2012-04-25 NOTE — Progress Notes (Signed)
MD Call and stated to give the 1500 levq.

## 2012-04-25 NOTE — Progress Notes (Signed)
Md said to finish Zosyn IV and strt levq 9-25 10 am

## 2012-04-25 NOTE — Progress Notes (Signed)
Subjective:    Interval Events:   She has greatly improved today and she feels less SOB but no quite well. She gets short of breath on going to the bathroom.    Objective:    Vital Signs:   Temp:  [97.4 F (36.3 C)-97.9 F (36.6 C)] 97.9 F (36.6 C) (09/24 0605) Pulse Rate:  [59-68] 61  (09/24 0953) Resp:  [19-20] 19  (09/24 0953) BP: (136-158)/(60-74) 136/74 mmHg (09/24 0953) SpO2:  [95 %-99 %] 98 % (09/24 0953) Weight:  [162 lb 14.7 oz (73.9 kg)] 162 lb 14.7 oz (73.9 kg) (09/24 0605) Last BM Date: 04/24/12   Weights: 24-hour Weight change: -15.7 oz (-0.445 kg)  Filed Weights   04/23/12 0444 04/24/12 0532 04/25/12 0605  Weight: 166 lb 8 oz (75.524 kg) 163 lb 14.4 oz (74.345 kg) 162 lb 14.7 oz (73.9 kg)     Intake/Output:   Intake/Output Summary (Last 24 hours) at 04/25/12 1419 Last data filed at 04/25/12 1321  Gross per 24 hour  Intake    920 ml  Output      2 ml  Net    918 ml       Physical Exam: General appearance: alert, cooperative, appears stated age, fatigued, mild distress and Pleasant elderly woman  Head: Normocephalic, without obvious abnormality, atraumatic  Eyes: conjunctivae/corneas clear. PERRL, EOM's intact. Fundi benign.  Nose: Nares normal. Septum midline. Mucosa normal. No drainage or sinus tenderness.  Neck: JVD - 10 cm above sternal notch and thyroid not enlarged, symmetric, no tenderness/mass/nodules  Lungs: rales bilaterally and There are crackles up to the level of the mid lung bilaterally.  Heart: regular rate and rhythm, S1, S2 normal, no murmur, click, rub or gallop  Abdomen: soft, non-tender; bowel sounds normal; no masses, no organomegaly and There is a well-healed surgical scar in the right lumbar region.  Extremities: extremities normal, atraumatic, no cyanosis or edema and edema Bilateral lower extremity edema up to the level of the knees.  Pulses: 2+ and symmetric  Skin: Skin color, texture, turgor normal. No rashes or lesions   Neurologic: Alert and oriented X 3, normal strength and tone. Normal symmetric reflexes. Normal coordination and gait   Labs: Basic Metabolic Panel:  Lab 04/25/12 1610 04/24/12 0846 04/23/12 0510 04/22/12 0555 04/21/12 0902 04/19/12 1954  NA 136 137 136 136 141 --  K 4.1 4.5 4.8 4.4 3.3* --  CL 103 104 101 102 104 --  CO2 22 23 21 26 24  --  GLUCOSE 93 132* 99 106* 130* --  BUN 19 19 24* 25* 22 --  CREATININE 1.28* 1.34* 1.43* 1.42* 1.27* --  CALCIUM 9.8 10.3 10.0 -- -- --  MG -- -- -- -- -- 2.0  PHOS -- -- -- -- -- --    Liver Function Tests:  Lab 04/19/12 1954  AST 19  ALT 9  ALKPHOS 169*  BILITOT 0.7  PROT 7.9  ALBUMIN 4.2   No results found for this basename: LIPASE:5,AMYLASE:5 in the last 168 hours No results found for this basename: AMMONIA:3 in the last 168 hours  CBC:  Lab 04/20/12 0240 04/19/12 1954 04/19/12 1526  WBC 7.4 8.1 6.9  NEUTROABS -- -- 3.9  HGB 12.1 13.5 13.2  HCT 36.9 40.4 39.7  MCV 88.5 88.6 89.4  PLT 213 262 239    Cardiac Enzymes:  Lab 04/20/12 0240 04/19/12 1953  CKTOTAL -- --  CKMB -- --  CKMBINDEX -- --  TROPONINI <0.30 <0.30  BNP: No components found with this basename: POCBNP:5  CBG: No results found for this basename: GLUCAP:5 in the last 168 hours  Coagulation Studies: No results found for this basename: LABPROT:5,INR:5 in the last 72 hours  Microbiology: Results for orders placed during the hospital encounter of 04/19/12  CULTURE, BLOOD (ROUTINE X 2)     Status: Normal (Preliminary result)   Collection Time   04/21/12  7:08 PM      Component Value Range Status Comment   Specimen Description BLOOD RIGHT ARM   Final    Special Requests BOTTLES DRAWN AEROBIC AND ANAEROBIC 10CC   Final    Culture  Setup Time 04/22/2012 02:39   Final    Culture     Final    Value:        BLOOD CULTURE RECEIVED NO GROWTH TO DATE CULTURE WILL BE HELD FOR 5 DAYS BEFORE ISSUING A FINAL NEGATIVE REPORT   Report Status PENDING   Incomplete    CULTURE, BLOOD (ROUTINE X 2)     Status: Normal (Preliminary result)   Collection Time   04/21/12  7:25 PM      Component Value Range Status Comment   Specimen Description BLOOD RIGHT HAND   Final    Special Requests BOTTLES DRAWN AEROBIC AND ANAEROBIC 10CC   Final    Culture  Setup Time 04/22/2012 02:39   Final    Culture     Final    Value:        BLOOD CULTURE RECEIVED NO GROWTH TO DATE CULTURE WILL BE HELD FOR 5 DAYS BEFORE ISSUING A FINAL NEGATIVE REPORT   Report Status PENDING   Incomplete     Other results: No overnight cardiac events.   Imaging: Dg Chest 2 View  04/25/2012  *RADIOLOGY REPORT*  Clinical Data: Shortness of breath  CHEST - 2 VIEW  Comparison: 04/21/2012  Findings: Mild cardiac enlargement.  There are bilateral pleural effusions identified. These are slightly improved from previous exam.Bibasilar atelectasis noted.  No interstitial edema.  No airspace consolidation noted.  IMPRESSION:  1. 1.  Slight improvement in bilateral pleural effusions.   Original Report Authenticated By: Rosealee Albee, [name]       Medications:    Infusions:     Scheduled Medications:    . aspirin EC  81 mg Oral Daily  . azithromycin  500 mg Oral QHS  . cinacalcet  30 mg Oral Q48H  . colchicine  0.6 mg Oral BID  . famotidine  20 mg Oral BID  . furosemide  80 mg Oral Daily  . heparin  5,000 Units Subcutaneous Q8H  . metoprolol tartrate  50 mg Oral BID  . mycophenolate  500 mg Oral BID  . piperacillin-tazobactam (ZOSYN)  IV  3.375 g Intravenous Q8H  . simvastatin  20 mg Oral QHS  . sodium chloride  3 mL Intravenous Q12H  . tacrolimus  2 mg Oral BID     PRN Medications: sodium chloride, acetaminophen, acetaminophen, docusate sodium, morphine injection, nitroGLYCERIN, ondansetron (ZOFRAN) IV, ondansetron, sodium chloride   Assessment/ Plan:   [name] is a 78 year old woman, with a past medical history of coronary artery disease, status post stent in 2002, status post  renal transplant in 2003, secondary to focal sclerosing glomerulonephritis who presents to the ED with progressive shortness of breath and bilateral, lower extremity edema.   Dyspnea/Orthopnea/PND, likely PNA  Patient presents with above symptoms with B/L P. Effusion which did not respond to IV diuretics  well. Echo confirmed EF of 70% with diastolic dysfunction and only mild dilated left atrium. Less likely acute CHF.  chest CT indicated ? RLL PNA, which may be the etiology of her SOB even though she remains afebrile with no leukocytosis, which may not present given her current immunosuppressant tx s/p renal transplant. She is improving. antibiotics with Zosyn and Azithromycin started on 9/21 - Oxygen as needed - broaden spectrum ABX started to cover strep, GNR, Pseudomonas ( immunosuppressant) - follow up on clinical manifestation  - will need cancer screening as outpatient. FOBT negative. Negative breast and lymph node exam. She refused thoracentesis -Restarted on her home Lasix at 80 mg once daily by mouth since this worsening of shortness of breath might be related to fluid overload - Will need PT evaluation today and discharge her today  - will change to oral antibiotics - Levaquin to complete 10days of treatment. This is day 5 of abx - repeat chest xray today shows improvement in her pleural effusion.  Acute gout  History of gout always on left MTP. On Allopurinol now. She reports last flareup was 1-2 years ago.  The clinical pic is consistent with acute flareup.  - allopurinol d/ced  - avoid NSAIDs. She does not want to take oral steroids  - more Colchicine 0.6 BID  - follow up on Cr today 1.28  S/P kidney transplant: Status post kidney transplant in 2003 at [facility]. Prior to the kidney transplant. She had been on hemodialysis. She's been doing well with her treatment. She did well post transplant and she continues with mycophenolate and Tacrolimus. She has a recent increase in  creatine in August but the renal bx was negative after her creatinine went up to 2.5.. Her baseline creatinine has indicated in her records from her nephrologist is around 1.4-1.9.   Plan Will monitor creatinine Tacrolimus levels 6.4 (not elevated)  Hypertension: She uses amlodipine 10 mg once daily, metoprolol, 50 mg twice daily, and furosemide 80 mg once daily. Given her low blood pressures in the ED of 137/47.  - Amlodipine d/ced since it increased Tacrolimus toxicity -will treat with Metroprolol  CAD (coronary artery disease): Status post right coronary artery stent. She's been treated with simvastatin 20 mg once daily, metoprolol, 50 mg once daily and aspirin 81 mg once daily. The metoprolol has been withheld given acute CHF. She will continue with the statin and aspirin. She has not been following with her cardiologist for the last 4 years, because she's been feeling fine.  Plan - Stable.   Superior vena cava syndrome  B/L JVD along veinous dilatation on upper chest. 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  Pulmonary nodule: Review of her records indicate that in May 2013 she was found to have a right upper lobe nodule. This nodule has never been followed up since then. Review of the x-rays he, at [facility] by the radiologist did not confirm this nodule. -f/u with 6 month ct scan  H/O secondary hyperparathyroidism: To continue with Sensipar   Disposition: Will monitor symptoms and consider discharge tomorrow  VTE: Lovenox  Length of Stay: 6 days   Signed by:  Dow Adolph PGY-I, Internal Medicine Pager [phone] 04/25/2012, 2:19 PM

## 2012-04-26 DIAGNOSIS — J42 Unspecified chronic bronchitis: Secondary | ICD-10-CM | POA: Diagnosis not present

## 2012-04-26 DIAGNOSIS — J189 Pneumonia, unspecified organism: Secondary | ICD-10-CM | POA: Diagnosis present

## 2012-04-26 LAB — BASIC METABOLIC PANEL
BUN: 20 mg/dL (ref 6–23)
CO2: 24 mEq/L (ref 19–32)
Chloride: 105 mEq/L (ref 96–112)
Creatinine, Ser: 1.5 mg/dL — ABNORMAL HIGH (ref 0.50–1.10)
GFR calc Af Amer: 37 mL/min — ABNORMAL LOW (ref >90)

## 2012-04-26 MED ORDER — COLCHICINE 0.6 MG PO TABS: 0.6000 mg | ORAL_TABLET | Freq: Every day | ORAL | Status: DC

## 2012-04-26 MED ORDER — AMLODIPINE BESYLATE 10 MG PO TABS: 10.0000 mg | ORAL_TABLET | Freq: Every day | ORAL | Status: DC

## 2012-04-26 MED ORDER — LEVOFLOXACIN 750 MG PO TABS: 750.0000 mg | ORAL_TABLET | ORAL | Status: DC

## 2012-04-26 MED ORDER — DSS 100 MG PO CAPS: 100.0000 mg | ORAL_CAPSULE | Freq: Two times a day (BID) | ORAL | Status: DC | PRN

## 2012-04-26 MED ORDER — LEVOFLOXACIN 750 MG PO TABS: 750.0000 mg | ORAL_TABLET | ORAL | Status: AC

## 2012-04-26 NOTE — Progress Notes (Signed)
D/c instructions done by change Nurse

## 2012-04-26 NOTE — Discharge Summary (Addendum)
[facility] Discharge Note  Name: [name] MRN: [id number] DOB: 1932-01-29 78 y.o.  Date of Admission: 04/19/2012  3:51 PM Date of Discharge: 04/26/2012 Attending Physician: No att. providers found  Discharge Diagnosis: Principal Problem:  *Acute CHF Active Problems:  S/P kidney transplant  CAD (coronary artery disease)  Hypertension  H/O secondary hyperparathyroidism  History of cholelithiasis  Hospital acquired PNA   Discharge Medications:   Medication List     As of 04/26/2012  7:33 PM    STOP taking these medications         amLODipine 10 MG tablet   Commonly known as: NORVASC      TAKE these medications         allopurinol 100 MG tablet   Commonly known as: ZYLOPRIM   Take 100 mg by mouth at bedtime.      aspirin EC 81 MG tablet   Take 81 mg by mouth daily.      cinacalcet 30 MG tablet   Commonly known as: SENSIPAR   Take 30 mg by mouth every other day.      colchicine 0.6 MG tablet   Take 1 tablet (0.6 mg total) by mouth daily.      DSS 100 MG Caps   Take 100 mg by mouth 2 (two) times daily as needed.      famotidine 20 MG tablet   Commonly known as: PEPCID   Take 20 mg by mouth 2 (two) times daily.      furosemide 80 MG tablet   Commonly known as: LASIX   Take 80 mg by mouth daily.      levofloxacin 750 MG tablet   Commonly known as: LEVAQUIN   Take 1 tablet (750 mg total) by mouth every other day.      metoprolol 50 MG tablet   Commonly known as: LOPRESSOR   Take 100 mg by mouth 2 (two) times daily.      mycophenolate 250 MG capsule   Commonly known as: CELLCEPT   Take 500 mg by mouth 2 (two) times daily.      simvastatin 20 MG tablet   Commonly known as: ZOCOR   Take 20 mg by mouth at bedtime.      tacrolimus 1 MG capsule   Commonly known as: PROGRAF   Take 2 mg by mouth 2 (two) times daily.         Disposition and follow-up:   [name] was discharged from [facility]  in stable condition.  At the hospital follow up visit please address  Please evaluate patient's symptoms of shortness of breath. Please note that amlodipine was discontinued as this interacts with tacrolimus. Please schedule a chest x-ray as followup for pneumonia. Please encourage patient to attend her appointments with her nephrologist.  Follow-up Appointments: Follow-up Information    Follow up with Janalyn Harder, MD. Call on 05/03/2012. (8:45am)    Contact information:   [address] [place] [address] [phone]       Follow up with Dyke Maes, MD. Call on 05/01/2012. (You already have an appointment scheduled with Dr Briant Cedar)    Contact information:   [address] [place] [address] [phone]         Discharge Orders    Future Appointments: Provider: Department: Dept Phone: Center:   05/03/2012 8:45 AM Linward Headland, MD [facility] [phone] [facility]     Future Orders  Please Complete By Expires   Diet - low sodium heart healthy      Increase activity slowly      Discharge instructions      Comments:   Please take your medications as prescribed Please follow up with your appointments   Call MD for:  temperature >100.4      Call MD for:  severe uncontrolled pain      Call MD for:  redness, tenderness, or signs of infection (pain, swelling, redness, odor or green/yellow discharge around incision site)      Call MD for:  difficulty breathing, headache or visual disturbances      Call MD for:  persistant dizziness or light-headedness         Consultations:    Procedures Performed:  Dg Chest 2 View  04/25/2012  *RADIOLOGY REPORT*  Clinical Data: Shortness of breath  CHEST - 2 VIEW  Comparison: 04/21/2012  Findings: Mild cardiac enlargement.  There are bilateral pleural effusions identified. These are slightly improved from previous exam.Bibasilar atelectasis noted.  No interstitial edema.  No airspace consolidation noted.  IMPRESSION:   1. 1.  Slight improvement in bilateral pleural effusions.   Original Report Authenticated By: Rosealee Albee, [name]    Dg Chest 2 View  04/21/2012  *RADIOLOGY REPORT*  Clinical Data: Follow-up, short of breath, smoking history  CHEST - 2 VIEW  Comparison: Chest x-ray of 04/19/2012  Findings: Bibasilar opacities remain consistent with basilar atelectasis and bilateral effusions.  Mild cardiomegaly is stable. No bony abnormality is seen.  IMPRESSION: Little change in bibasilar atelectasis and bilateral effusions.   Original Report Authenticated By: Juline Patch, [name]    Dg Chest 2 View  04/19/2012  *RADIOLOGY REPORT*  Clinical Data: Shortness of breath  CHEST - 2 VIEW  Comparison: 12/30/2011  Findings: Heart size is normal.  There are moderate bilateral pleural effusions right greater than left.  Mild interstitial edema is noted.  Plate-like atelectasis noted in the lung bases.  No airspace consolidation identified.  IMPRESSION:  1.  Mild to moderate fluid overload.   Original Report Authenticated By: Rosealee Albee, [name]    Ct Chest Wo Contrast  04/21/2012  *RADIOLOGY REPORT*  Clinical Data: Bilateral pleural effusions.  CT CHEST WITHOUT CONTRAST  Technique:  Multidetector CT imaging of the chest was performed following the standard protocol without IV contrast.  Comparison: Chest x-ray 04/21/2012.  No prior chest CTs.  Findings:  Mediastinum: Heart size is mildly enlarged. There is no significant pericardial fluid, thickening or pericardial calcification. There is atherosclerosis of the thoracic aorta, the great vessels of the mediastinum and the coronary arteries, including calcified atherosclerotic plaque in the  left main, left anterior descending, left circumflex and right coronary arteries. Numerous borderline enlarged mediastinal lymph nodes are conspicuous in number rather than size. No pathologically enlarged mediastinal or hilar lymph nodes. Please note that accurate exclusion of hilar  adenopathy is limited on noncontrast CT scans.  Esophagus is unremarkable in appearance. There are some faint calcifications within the superior vena cava, which could suggest the presence of chronic thrombus or chronic calcified fibrin sheath.  Lungs/Pleura: There are bilateral pleural effusions layering dependently which are simple in appearance on this noncontrast examination.  These are associated with areas of atelectasis throughout the lung bases bilaterally, with some potential air space consolidation in the anterior and lateral aspect of the right lower lobe where there are multiple air bronchograms in a region of partial collapse and poor aeration.  There are two small pulmonary nodules, one of which is only 2 mm in the periphery of the left lower lobe (image 38 of series 3), while the other is 5 mm associated with the minor fissure (image 29 of series 3).  No other larger more suspicious appearing pulmonary nodules or masses are otherwise identified.  Upper Abdomen: Unremarkable.  Musculoskeletal: There are numerous venous collaterals throughout the anterior chest wall bilaterally. There are no aggressive appearing lytic or blastic lesions noted in the visualized portions of the skeleton.  IMPRESSION: 1.  Small bilateral pleural effusions with areas of passive atelectasis throughout the lung bases bilaterally, in addition to an area of probable airspace consolidation in the anterior and lateral basal segments of the right lower lobe, concerning for pneumonia. 2.  Calcifications in the superior vena cava.  Given the presence of the numerous venous collaterals throughout the chest wall, these calcifications are suspicious for potential chronic calcified thrombus or calcified fibrin sheath.  Clinical correlation for signs and symptoms of SVC syndrome is recommended. 3. Atherosclerosis, including left main and three-vessel coronary artery disease. 4.  Tiny pulmonary nodules, as above, the largest of which is 5  mm associated with the minor fissure. If the patient is at high risk for bronchogenic carcinoma, follow-up chest CT at 6-12 months is recommended.  If the patient is at low risk for bronchogenic carcinoma, follow-up chest CT at 12 months is recommended.  This recommendation follows the consensus statement: Guidelines for Management of Small Pulmonary Nodules Detected on CT Scans: A Statement from the Fleischner Society as published in Radiology 2005; 237:395-400.   Original Report Authenticated By: Florencia Reasons, [name]     2D Echo: Study Conclusions  - Left ventricle: The cavity size was normal. Wall thickness was increased in a pattern of mild LVH. The estimated ejection fraction was 70%. Wall motion was normal; there were no regional wall motion abnormalities. Findings consistent with left ventricular diastolic dysfunction. Doppler parameters are consistent with high ventricular filling pressure. - Right ventricle: The cavity size was normal. Systolic function was normal. - Right atrium: The atrium was mildly dilated. 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 She reported that she had been in her usual state of health until 3 weeks ago, when she progressively developed shortness of breath, associated with orthopnea, PND, and swelling of her lower extremities. The shortness of breath has worsened to the extent that she gets short of breath with talking, and turning in bed. She has increased the number of pillows. She needs in the bed from 2 to 4 in the last 2 weeks. She denies history of chest pain, dizziness, or history of falls. She does not report any history of cough, she does not report any history of fevers or chills. She's been following up  with her nephrologist. After the kidney transplant, and she's been doing fine. One month ago, she had follow up kidney biopsy due to an increase in her creatinine and biopsy report came out normal. Her creatine level returned down to baseline without any treatment. Her home medications include furosemide 80 mg once daily, amlodipine 10 mg once daily, and metoprolol in addition to mycophenolate and Tacrolimus. She reports, that she's been compliant with her medications. She has not had a history  of swelling of her legs in the past.  Review of her records indicate that she had of left cardiac catheterization with a stent in the right coronary artery which had stenosis of 80% and since then. She has not been following up with her cardiologist, because she's been feeling fine. An echocardiogram performed around the same time showed an EF of 55-65%. She has no echocardiogram since that time.   Hospital Course by problem list:  Hospital-acquired pneumonia: Ms. Friedrich presentation to hospital was initially perplexing as it was difficult to determine whether her shortness of breath was as a result of a pneumonia versus fluid overload. The patient had had 3 weeks of increasing shortness of breath with some swelling of her lower extremities bilaterally. However, she did not have any history of heart failure. In addition, Echo confirmed EF of 70% with diastolic dysfunction and only mild dilated left. This did not much with the features of chest x-ray, and symptomatology of the patient. She had been admitted at [facility] for a right kidney biopsy on 03/23/2012. The patient further indicated that since that time she was discharged after one day of hospitalization, she had never felt well with increased shortness of breath. However, she denied any history of cough, fever or chills. Her white blood cell counts were within normal limits. However, given the patient's immune suppression status the suspicion of  hospital-acquired pneumonia was considered. A chest x-ray was remarkable for bilateral, pleural effusions with pulmonary infiltrates, as a result of fluid overload, versus pneumonia. On admission, the patient was started on IV diuretics, as the cause of her shortness of breath was initially thought to be resulting from fluid overload. However, the patient did not respond to this therapy, which caused a reconsideration of her diagnosis and changing of therapy.Chest CT scan revealed bilateral, pleural effusions with areas of atelectasis throughout the lung bases bilaterally. In addition, there are areas of probable airspace consolidation in the anterior and bilateral, basal segments of lungs, which were concerning for pneumonia. She significantly responded to antibiotic treatment with Zosyn and Azithromycin. We'll do followup x-rays showed reduction in the pleural fluid. She was discharged on a renal does of Levaquin to complete a total of 10 days of treatment. She underwent PT evaluation and treatment on the above discharge. PT recommended home PT. She'll followup as outpatient with a 6 weeks chest x-ray.  This is not likely an aspiration pneumonia, though it is not possible to definitively determine this.  Bacterial culprit was not discovered because the patient was not producing sputum.   S/P kidney transplant: Status post kidney transplant in 2003 at [facility]. Prior to the kidney transplant. She had been on hemodialysis. She's been doing well with her treatment. She did well post transplant and she continues with mycophenolate and Tacrolimus. She has a recent increase in creatine in August but the renal bx was negative after her creatinine went up to 2.5.. Her baseline creatinine has indicated in her records from her nephrologist is around 1.4-1.9. We monitored her renal function and took extra precaution, especially with medication during the hospitalization. Tacrolimus levels 6.4. Acute gout: On day 3 of  hospitalization, the patient had flaring of her gout in the left foot. She was started on a renal dose of colchicine and significantly improved. Her kidney function remained stable at 1.28. Hypertension: She uses amlodipine 10 mg once daily, metoprolol, 50 mg twice daily, and furosemide 80 mg once daily. Given her low blood pressures in the ED  of 137/47. Amlodipine was discontinued as it increases tacrolimus toxicity by up to 50%.  She was switched to metoprolol. H/O secondary hyperparathyroidism: To continue with Sensipar   Discharge Vitals:  BP 157/57  Pulse 61  Temp 97.7 F (36.5 C) (Oral)  Resp 18  Ht 5\' 5"  (1.651 m)  Wt 163 lb (73.936 kg)  BMI 27.12 kg/m2  SpO2 96%  Discharge Labs:  Results for orders placed during the hospital encounter of 04/19/12 (from the past 24 hour(s))  BASIC METABOLIC PANEL     Status: Abnormal   Collection Time   04/26/12  5:25 AM      Component Value Range   Sodium 140  135 - 145 mEq/L   Potassium 4.1  3.5 - 5.1 mEq/L   Chloride 105  96 - 112 mEq/L   CO2 24  19 - 32 mEq/L   Glucose, Bld 87  70 - 99 mg/dL   BUN 20  6 - 23 mg/dL   Creatinine, Ser 1.19 (*) 0.50 - 1.10 mg/dL   Calcium 14.7  8.4 - 82.9 mg/dL   GFR calc non Af Amer 32 (*) >90 mL/min   GFR calc Af Amer 37 (*) >90 mL/min    Signed: Dow Adolph 04/26/2012, 7:33 PM   Time Spent on Discharge: 

## 2012-04-26 NOTE — Progress Notes (Signed)
Subjective:    Interval Events:   She reports significant improvement today. She is looking forward to going home. She is waiting to have PT evaluation just before she goes home.     Objective:    Vital Signs:   Temp:  [97.7 F (36.5 C)-98.3 F (36.8 C)] 97.7 F (36.5 C) (09/25 0935) Pulse Rate:  [58-62] 61  (09/25 0935) Resp:  [18-20] 18  (09/25 0935) BP: (126-163)/(51-82) 157/57 mmHg (09/25 0935) SpO2:  [96 %-100 %] 96 % (09/25 0950) Weight:  [163 lb (73.936 kg)] 163 lb (73.936 kg) (09/25 0508) Last BM Date: 04/26/12   Weights: 24-hour Weight change: 1.3 oz (0.036 kg)  Filed Weights   04/24/12 0532 04/25/12 0605 04/26/12 0508  Weight: 163 lb 14.4 oz (74.345 kg) 162 lb 14.7 oz (73.9 kg) 163 lb (73.936 kg)     Intake/Output:   Intake/Output Summary (Last 24 hours) at 04/26/12 1136 Last data filed at 04/25/12 2200  Gross per 24 hour  Intake    600 ml  Output      0 ml  Net    600 ml       Physical Exam: General appearance: alert, cooperative, appears stated age, fatigued, mild distress and Pleasant elderly woman  Head: Normocephalic, without obvious abnormality, atraumatic  Eyes: conjunctivae/corneas clear. PERRL, EOM's intact. Fundi benign.  Nose: Nares normal. Septum midline. Mucosa normal. No drainage or sinus tenderness.  Neck: JVD - 10 cm above sternal notch and thyroid not enlarged, symmetric, no tenderness/mass/nodules  Lungs: rales bilaterally and There are crackles up to the level of the mid lung bilaterally.  Heart: regular rate and rhythm, S1, S2 normal, no murmur, click, rub or gallop  Abdomen: soft, non-tender; bowel sounds normal; no masses, no organomegaly and There is a well-healed surgical scar in the right lumbar region.  Extremities: extremities normal, atraumatic, no cyanosis or edema and edema Bilateral lower extremity edema up to the level of the knees.  Pulses: 2+ and symmetric  Skin: Skin color, texture, turgor normal. No rashes or  lesions  Neurologic: Alert and oriented X 3, normal strength and tone. Normal symmetric reflexes. Normal coordination and gait   Labs: Basic Metabolic Panel:  Lab 04/26/12 8295 04/25/12 0610 04/24/12 0846 04/23/12 0510 04/22/12 0555 04/19/12 1954  NA 140 136 137 136 136 --  K 4.1 4.1 4.5 4.8 4.4 --  CL 105 103 104 101 102 --  CO2 24 22 23 21 26  --  GLUCOSE 87 93 132* 99 106* --  BUN 20 19 19  24* 25* --  CREATININE 1.50* 1.28* 1.34* 1.43* 1.42* --  CALCIUM 10.4 9.8 10.3 -- -- --  MG -- -- -- -- -- 2.0  PHOS -- -- -- -- -- --    Liver Function Tests:  Lab 04/19/12 1954  AST 19  ALT 9  ALKPHOS 169*  BILITOT 0.7  PROT 7.9  ALBUMIN 4.2   No results found for this basename: LIPASE:5,AMYLASE:5 in the last 168 hours No results found for this basename: AMMONIA:3 in the last 168 hours  CBC:  Lab 04/20/12 0240 04/19/12 1954 04/19/12 1526  WBC 7.4 8.1 6.9  NEUTROABS -- -- 3.9  HGB 12.1 13.5 13.2  HCT 36.9 40.4 39.7  MCV 88.5 88.6 89.4  PLT 213 262 239    Cardiac Enzymes:  Lab 04/20/12 0240 04/19/12 1953  CKTOTAL -- --  CKMB -- --  CKMBINDEX -- --  TROPONINI <0.30 <0.30    BNP: No components  found with this basename: POCBNP:5  CBG: No results found for this basename: GLUCAP:5 in the last 168 hours  Coagulation Studies: No results found for this basename: LABPROT:5,INR:5 in the last 72 hours  Microbiology: Results for orders placed during the hospital encounter of 04/19/12  CULTURE, BLOOD (ROUTINE X 2)     Status: Normal (Preliminary result)   Collection Time   04/21/12  7:08 PM      Component Value Range Status Comment   Specimen Description BLOOD RIGHT ARM   Final    Special Requests BOTTLES DRAWN AEROBIC AND ANAEROBIC 10CC   Final    Culture  Setup Time 04/22/2012 02:39   Final    Culture     Final    Value:        BLOOD CULTURE RECEIVED NO GROWTH TO DATE CULTURE WILL BE HELD FOR 5 DAYS BEFORE ISSUING A FINAL NEGATIVE REPORT   Report Status PENDING    Incomplete   CULTURE, BLOOD (ROUTINE X 2)     Status: Normal (Preliminary result)   Collection Time   04/21/12  7:25 PM      Component Value Range Status Comment   Specimen Description BLOOD RIGHT HAND   Final    Special Requests BOTTLES DRAWN AEROBIC AND ANAEROBIC 10CC   Final    Culture  Setup Time 04/22/2012 02:39   Final    Culture     Final    Value:        BLOOD CULTURE RECEIVED NO GROWTH TO DATE CULTURE WILL BE HELD FOR 5 DAYS BEFORE ISSUING A FINAL NEGATIVE REPORT   Report Status PENDING   Incomplete     Other results: No overnight cardiac events.   Imaging: Dg Chest 2 View  04/25/2012  *RADIOLOGY REPORT*  Clinical Data: Shortness of breath  CHEST - 2 VIEW  Comparison: 04/21/2012  Findings: Mild cardiac enlargement.  There are bilateral pleural effusions identified. These are slightly improved from previous exam.Bibasilar atelectasis noted.  No interstitial edema.  No airspace consolidation noted.  IMPRESSION:  1. 1.  Slight improvement in bilateral pleural effusions.   Original Report Authenticated By: Rosealee Albee, [name]       Medications:    Infusions:     Scheduled Medications:    . aspirin EC  81 mg Oral Daily  . cinacalcet  30 mg Oral Q48H  . colchicine  0.6 mg Oral BID  . famotidine  20 mg Oral BID  . furosemide  80 mg Oral Daily  . heparin  5,000 Units Subcutaneous Q8H  . levofloxacin  750 mg Oral Daily  . metoprolol tartrate  50 mg Oral BID  . mycophenolate  500 mg Oral BID  . simvastatin  20 mg Oral QHS  . sodium chloride  3 mL Intravenous Q12H  . tacrolimus  2 mg Oral BID  . DISCONTD: azithromycin  500 mg Oral QHS  . DISCONTD: piperacillin-tazobactam (ZOSYN)  IV  3.375 g Intravenous Q8H     PRN Medications: sodium chloride, acetaminophen, acetaminophen, docusate sodium, morphine injection, nitroGLYCERIN, ondansetron (ZOFRAN) IV, ondansetron, sodium chloride   Assessment/ Plan:   Ms. Laumann is a 78 year old woman, with a past medical history of  coronary artery disease, status post stent in 2002, status post renal transplant in 2003, secondary to focal sclerosing glomerulonephritis who presents to the ED with progressive shortness of breath and bilateral, lower extremity edema.   Dyspnea/Orthopnea/PND, likely PNA  Patient presents with above symptoms with B/L P. Effusion  which did not respond to IV diuretics well. Echo confirmed EF of 70% with diastolic dysfunction and only mild dilated left atrium. Less likely acute CHF.  chest CT indicated ? RLL PNA, which may be the etiology of her SOB even though she remains afebrile with no leukocytosis, which may not present given her current immunosuppressant tx s/p renal transplant. She is improving. antibiotics with Zosyn and Azithromycin started on 9/21 - Oxygen as needed - broaden spectrum ABX started to cover strep, GNR, Pseudomonas ( immunosuppressant) - follow up on clinical manifestation  - will need cancer screening as outpatient. FOBT negative. Negative breast and lymph node exam. She refused thoracentesis -Restarted on her home Lasix at 80 mg once daily by mouth since this worsening of shortness of breath might be related to fluid overload - Will need PT evaluation before discharge - will change to oral antibiotics - Levaquin to complete 10 days of treatment. This is day 6 of abx - repeat chest xray today shows improvement in her pleural effusion. She will need a followup chest x-ray in 6 weeks as outpatient  Acute gout  History of gout always on left MTP. On Allopurinol now. She reports last flareup was 1-2 years ago.  The clinical pic is consistent with acute flareup.  - allopurinol d/ced  - avoid NSAIDs. She does not want to take oral steroids  - more Colchicine 0.6 BID  - follow up on Cr today 1.28  S/P kidney transplant: Status post kidney transplant in 2003 at [facility]. Prior to the kidney transplant. She had been on hemodialysis. She's been doing well with her treatment.  She did well post transplant and she continues with mycophenolate and Tacrolimus. She has a recent increase in creatine in August but the renal bx was negative after her creatinine went up to 2.5.. Her baseline creatinine has indicated in her records from her nephrologist is around 1.4-1.9.   Plan Will monitor creatinine Tacrolimus levels 6.4 (not elevated)  Hypertension: She uses amlodipine 10 mg once daily, metoprolol, 50 mg twice daily, and furosemide 80 mg once daily. Given her low blood pressures in the ED of 137/47.  - Amlodipine d/ced since it increased Tacrolimus toxicity -will treat with Metroprolol  CAD (coronary artery disease): Status post right coronary artery stent. She's been treated with simvastatin 20 mg once daily, metoprolol, 50 mg once daily and aspirin 81 mg once daily. The metoprolol has been withheld given acute CHF. She will continue with the statin and aspirin. She has not been following with her cardiologist for the last 4 years, because she's been feeling fine.  Plan - Stable.   Superior vena cava syndrome  B/L JVD along veinous dilatation on upper chest. 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  Pulmonary nodule: Review of her records indicate that in May 2013 she was found to have a right upper lobe nodule. This nodule has never been followed up since then. Review of the x-rays he, at [facility] by the radiologist did not confirm this nodule. -f/u with 6 month ct scan  H/O secondary hyperparathyroidism: To continue with Sensipar   Disposition: Will monitor symptoms and consider discharge tomorrow  VTE: Lovenox  Length of Stay: 7 days   Signed by:  Dow Adolph PGY-I, Internal Medicine Pager [phone] 04/26/2012, 11:36 AM

## 2012-04-26 NOTE — Care Management Note (Signed)
    Page 1 of 2   04/26/2012     2:03:18 PM   CARE MANAGEMENT NOTE 04/26/2012  Patient:  [name], [name]   Account Number:  [id number]  Date Initiated:  04/24/2012  Documentation initiated by:  Tera Mater  Subjective/Objective Assessment:   78yo female admitted with AMS.  Pt. lives at home with dtr.     Action/Plan:   Discharge planning   Anticipated DC Date:  04/25/2012   Anticipated DC Plan:  HOME/SELF CARE         PAC Choice  HOME HEALTH   Choice offered to / List presented to:  C-1 Patient        HH arranged  HH-2 PT      HH agency  [facility]   Status of service:  Completed, signed off Medicare Important Message given?   (If response is "NO", the following Medicare IM given date fields will be blank) Date Medicare IM given:   Date Additional Medicare IM given:    Discharge Disposition:  HOME W HOME HEALTH SERVICES  Per UR Regulation:  Reviewed for med. necessity/level of care/duration of stay  If discussed at Long Length of Stay Meetings, dates discussed:   04/25/2012    Comments:  04/26/12 1345 Tera Mater, RN, BSN NCM [phone] In to speak with pt. about [facility] services, as well as giving pt. list of [facility] agencies.  PT has recommended HH PT.  Pt. chose [facility].  TC to [facility], with [facility], to give referral for [facility] PT.  SOC will begin 24-48hr post dc.  Pt. will be discharged home today.   04/24/12 1528 Tera Mater, RN, BSN NCM [phone] TC to Dr. Zada Girt to inquire of disposition, and he reports pt. was SOB today and will be discharged home tomorrow with family.

## 2012-04-26 NOTE — Evaluation (Signed)
Physical Therapy Evaluation Patient Details Name: [name] MRN: [id number] DOB: September 12, 1931 Today's Date: 04/26/2012 Time: 4098-1191 PT Time Calculation (min): 16 min  PT Assessment / Plan / Recommendation Clinical Impression  Patient s/p CHF with decr mobility secondary to decr endurance and decr high level balance.  Recommend HHPT f/u.  Patient to d/c today per MD.  Jeanene Erb MD to update re: home needs.  Thanks.      PT Assessment  All further PT needs can be met in the next venue of care    Follow Up Recommendations  Home health PT    Barriers to Discharge        Equipment Recommendations  Gilmer Mor (suggested cane to patient and patient to decide)    Recommendations for Other Services     Frequency      Precautions / Restrictions Precautions Precautions: None Restrictions Weight Bearing Restrictions: No   Pertinent Vitals/Pain VSS, no pain      Mobility  Bed Mobility Bed Mobility: Supine to Sit Supine to Sit: 7: Independent Transfers Transfers: Sit to Stand;Stand to Sit Sit to Stand: 7: Independent Stand to Sit: 7: Independent Ambulation/Gait Ambulation/Gait Assistance: 7: Independent Ambulation Distance (Feet): 150 Feet Assistive device: None Ambulation/Gait Assistance Details: Patient takes her time and ambulates with safe and steady gait.  Patient occasionally reaches for furniture.  Feel patient is at baseline most likely.  No safety issues.  Educated patient she may incr her steadiness with a cane.   Gait Pattern: Step-through pattern;Decreased stride length Gait velocity: decreased Stairs: No Wheelchair Mobility Wheelchair Mobility: No         PT Diagnosis: Generalized weakness  PT Problem List: Decreased mobility;Decreased balance PT Treatment Interventions:     PT Goals    Visit Information  Last PT Received On: 04/26/12 Assistance Needed: +1    Subjective Data  Subjective: "I will try to walk some.  I haven't gotten up a lot." Patient Stated  Goal: To go home   Prior Functioning  Home Living Lives With: Alone Available Help at Discharge: Family;Friend(s) Type of Home: House Home Access: Level entry Home Layout: One level Bathroom Toilet: Standard Home Adaptive Equipment: None Prior Function Level of Independence: Independent Able to Take Stairs?: No Driving: No Vocation: Retired Musician: No difficulties    Cognition  Overall Cognitive Status: Appears within functional limits for tasks assessed/performed Arousal/Alertness: Awake/alert Orientation Level: Appears intact for tasks assessed Behavior During Session: [facility] for tasks performed    Extremity/Trunk Assessment Right Upper Extremity Assessment RUE ROM/Strength/Tone: [facility] for tasks assessed Left Upper Extremity Assessment LUE ROM/Strength/Tone: WFL for tasks assessed Right Lower Extremity Assessment RLE ROM/Strength/Tone: [facility] for tasks assessed Left Lower Extremity Assessment LLE ROM/Strength/Tone: [facility] for tasks assessed Trunk Assessment Trunk Assessment: Normal   Balance Standardized Balance Assessment Standardized Balance Assessment: Berg Balance Test Berg Balance Test Sit to Stand: Able to stand without using hands and stabilize independently Standing Unsupported: Able to stand safely 2 minutes Sitting with Back Unsupported but Feet Supported on Floor or Stool: Able to sit safely and securely 2 minutes Stand to Sit: Sits safely with minimal use of hands Transfers: Able to transfer safely, minor use of hands Standing Unsupported with Eyes Closed: Able to stand 10 seconds safely Standing Ubsupported with Feet Together: Able to place feet together independently and stand 1 minute safely From Standing, Reach Forward with Outstretched Arm: Can reach confidently >25 cm (10") From Standing Position, Pick up Object from Floor: Able to pick up shoe  safely and easily From Standing Position, Turn to Look Behind Over each Shoulder: Looks behind  from both sides and weight shifts well Turn 360 Degrees: Able to turn 360 degrees safely in 4 seconds or less Standing Unsupported, Alternately Place Feet on Step/Stool: Able to stand independently and safely and complete 8 steps in 20 seconds Standing Unsupported, One Foot in Front: Loses balance while stepping or standing Standing on One Leg: Able to lift leg independently and hold 5-10 seconds Total Score: 51  High Level Balance High Level Balance Comments: 51/56 on Berg suggesting at risk for falls without a device.  Educated patient to use cane on d/c for ultimate safety.    End of Session PT - End of Session Equipment Utilized During Treatment: Gait belt Activity Tolerance: Patient tolerated treatment well Patient left: with call bell/phone within reach;in bed Nurse Communication: Mobility status       INGOLD,Murl Zogg 04/26/2012, 10:39 AM  [facility] [phone] (pager)

## 2012-04-28 LAB — CULTURE, BLOOD (ROUTINE X 2): Culture: NO GROWTH

## 2012-05-01 DIAGNOSIS — Z23 Encounter for immunization: Secondary | ICD-10-CM | POA: Diagnosis not present

## 2012-05-01 DIAGNOSIS — Z940 Kidney transplant status: Secondary | ICD-10-CM | POA: Diagnosis not present

## 2012-05-01 DIAGNOSIS — Z79899 Other long term (current) drug therapy: Secondary | ICD-10-CM | POA: Diagnosis not present

## 2012-05-03 ENCOUNTER — Encounter: Payer: Self-pay | Admitting: Internal Medicine

## 2012-05-03 ENCOUNTER — Ambulatory Visit (INDEPENDENT_AMBULATORY_CARE_PROVIDER_SITE_OTHER): Payer: Medicare Other | Admitting: Internal Medicine

## 2012-05-03 VITALS — BP 125/65 | HR 56 | Temp 97.6°F | Ht 60.0 in | Wt 160.5 lb

## 2012-05-03 DIAGNOSIS — Z940 Kidney transplant status: Secondary | ICD-10-CM | POA: Diagnosis not present

## 2012-05-03 DIAGNOSIS — M109 Gout, unspecified: Secondary | ICD-10-CM

## 2012-05-03 DIAGNOSIS — Z1211 Encounter for screening for malignant neoplasm of colon: Secondary | ICD-10-CM

## 2012-05-03 DIAGNOSIS — I10 Essential (primary) hypertension: Secondary | ICD-10-CM

## 2012-05-03 DIAGNOSIS — J189 Pneumonia, unspecified organism: Secondary | ICD-10-CM | POA: Diagnosis not present

## 2012-05-03 DIAGNOSIS — Y95 Nosocomial condition: Secondary | ICD-10-CM

## 2012-05-03 DIAGNOSIS — D649 Anemia, unspecified: Secondary | ICD-10-CM | POA: Diagnosis not present

## 2012-05-03 DIAGNOSIS — I509 Heart failure, unspecified: Secondary | ICD-10-CM

## 2012-05-03 DIAGNOSIS — I5030 Unspecified diastolic (congestive) heart failure: Secondary | ICD-10-CM

## 2012-05-03 DIAGNOSIS — R195 Other fecal abnormalities: Secondary | ICD-10-CM

## 2012-05-03 DIAGNOSIS — Z23 Encounter for immunization: Secondary | ICD-10-CM

## 2012-05-03 DIAGNOSIS — Z0000 Encounter for general adult medical examination without abnormal findings: Secondary | ICD-10-CM | POA: Insufficient documentation

## 2012-05-03 DIAGNOSIS — Z79899 Other long term (current) drug therapy: Secondary | ICD-10-CM | POA: Diagnosis not present

## 2012-05-03 DIAGNOSIS — N2581 Secondary hyperparathyroidism of renal origin: Secondary | ICD-10-CM | POA: Diagnosis not present

## 2012-05-03 MED ORDER — PNEUMOCOCCAL VAC POLYVALENT 25 MCG/0.5ML IJ INJ: 0.5000 mL | INJECTION | Freq: Once | INTRAMUSCULAR | Status: DC

## 2012-05-03 NOTE — Assessment & Plan Note (Signed)
BP well controlled, continue current regimen. 

## 2012-05-03 NOTE — Assessment & Plan Note (Signed)
-  patient given pneumovax -patient given fecal occult blood cards for colon cancer screening

## 2012-05-03 NOTE — Assessment & Plan Note (Signed)
Patient reports a history of gout, currently on allopurinol, though never confirmed by joint aspiration.  No uric acid value in epic. -continue allopurinol -check uric acid at next visit (patient declines lab draw today)

## 2012-05-03 NOTE — Progress Notes (Signed)
HPI The patient is a 78 y.o. yo female with a history of CAD, FSGN s/p kidney transplant, HTN, presenting for an initial visit to establish care and for hospital follow-up.  The patient was recently hospitalized 04/19/12-04/26/12 for SOB, initally thought to be a CHF exacerbation (though EF 70%, some diastolic dysfunction), treated with diuretics.  After diuresis, CXR revealed an opacity concerning for HAP, treated with zosyn/azithro, transitioned to levaquin.  Since hospital discharge, the patient has continued to take levaquin, and notes resolution of cough, no fevers, no congestion.  Since hospital discharge, the patient has only had minor swelling in the lower extremities which resolves with elevation.  She notes that she has returned to her baseline 2 pillow orthopnea, and has had no PND since hospital discharge.  The patient has a history of CAD, s/p RCA stent about 5 years ago.  She notes no recent chest pain.  The patient has a history of FSGN, s/p renal transplant in 2003.  She follows with Dr. Briant Cedar, and last saw him on 05/01/12.  The patient has a history of gout since 2009.  She has never had joint aspiration for confirmation of gout, but takes allopurinol daily, and colchicine for acute flares.  ROS: General: no fevers, chills, changes in weight, changes in appetite Skin: no rash HEENT: no blurry vision, hearing changes, sore throat Pulm: no dyspnea, coughing, wheezing CV: no chest pain, palpitations, shortness of breath Abd: no abdominal pain, nausea/vomiting, diarrhea/constipation GU: no dysuria, hematuria, polyuria Ext: no arthralgias, myalgias Neuro: no weakness, numbness, or tingling  Filed Vitals:   05/03/12 1416  BP: 125/65  Pulse: 56  Temp: 97.6 F (36.4 C)    PEX General: alert, cooperative, and in no apparent distress HEENT: pupils equal round and reactive to light, vision grossly intact, oropharynx clear and non-erythematous  Neck: supple, no  lymphadenopathy Lungs: clear to ascultation bilaterally, normal work of respiration, no wheezes, rales, ronchi Heart: regular rate and rhythm, no murmurs, gallops, or rubs Abdomen: soft, non-tender, non-distended, normal bowel sounds Extremities: no cyanosis, clubbing, or edema Neurologic: alert & oriented X3, cranial nerves II-XII intact, strength grossly intact, sensation intact to light touch  Current Outpatient Prescriptions on File Prior to Visit  Medication Sig Dispense Refill  . allopurinol (ZYLOPRIM) 100 MG tablet Take 100 mg by mouth at bedtime.      Marland Kitchen aspirin EC 81 MG tablet Take 81 mg by mouth daily.      . cinacalcet (SENSIPAR) 30 MG tablet Take 30 mg by mouth every other day.       . colchicine 0.6 MG tablet Take 1 tablet (0.6 mg total) by mouth daily.  7 tablet  0  . Docusate Sodium (DSS) 100 MG CAPS Take 100 mg by mouth 2 (two) times daily as needed.  30 each  0  . famotidine (PEPCID) 20 MG tablet Take 20 mg by mouth 2 (two) times daily.      . furosemide (LASIX) 80 MG tablet Take 80 mg by mouth daily.      Marland Kitchen levofloxacin (LEVAQUIN) 750 MG tablet Take 1 tablet (750 mg total) by mouth every other day.  10 tablet  0  . metoprolol (LOPRESSOR) 50 MG tablet Take 100 mg by mouth 2 (two) times daily.      . mycophenolate (CELLCEPT) 250 MG capsule Take 500 mg by mouth 2 (two) times daily.      . simvastatin (ZOCOR) 20 MG tablet Take 20 mg by mouth at bedtime.       Marland Kitchen  tacrolimus (PROGRAF) 1 MG capsule Take 2 mg by mouth 2 (two) times daily.         Assessment/Plan

## 2012-05-03 NOTE — Assessment & Plan Note (Signed)
The patient presents for follow-up of HAP.  She now notes no fevers, chills, cough, or SOB.  Treated with zosyn/azithromycin in the inpatient setting, transitioned to levaquin (renally dosed), which she is still completing. -follow-up CXR in 6 weeks

## 2012-05-03 NOTE — Patient Instructions (Addendum)
To ensure that your Pneumonia has resolved, please come to the hospital for a Chest X-ray in 6 weeks (around November 13th) -Report for a walk-in appointment to Radiology, on the 1st floor of [facility] around that time  You were given Fecal Occult Blood cards today to look for blood in your stool.  On 3 separate days, follow the instructions on the card to place a sample of stool on one card per day.  Seal the cards, and mail them back in the enclosed envelopes.  Please return for a follow-up visit in about 6 months (6-7 months)

## 2012-05-03 NOTE — Assessment & Plan Note (Signed)
The patient was diuresed during her last hospitalization, and now notes only her baseline 2-pillow orthopnea, with no PND and only transient LE edema. -continue metprolol, lasix

## 2012-06-29 ENCOUNTER — Encounter (HOSPITAL_COMMUNITY): Payer: Self-pay | Admitting: Emergency Medicine

## 2012-06-29 ENCOUNTER — Emergency Department (HOSPITAL_COMMUNITY): Payer: Medicare Other

## 2012-06-29 ENCOUNTER — Emergency Department (HOSPITAL_COMMUNITY)
Admission: EM | Admit: 2012-06-29 | Discharge: 2012-06-29 | Disposition: A | Discharge status: Home / Self Care | Payer: Medicare Other | Attending: Emergency Medicine | Admitting: Emergency Medicine

## 2012-06-29 DIAGNOSIS — Z940 Kidney transplant status: Secondary | ICD-10-CM | POA: Insufficient documentation

## 2012-06-29 DIAGNOSIS — Z7982 Long term (current) use of aspirin: Secondary | ICD-10-CM | POA: Insufficient documentation

## 2012-06-29 DIAGNOSIS — I2510 Atherosclerotic heart disease of native coronary artery without angina pectoris: Secondary | ICD-10-CM | POA: Insufficient documentation

## 2012-06-29 DIAGNOSIS — Y929 Unspecified place or not applicable: Secondary | ICD-10-CM | POA: Insufficient documentation

## 2012-06-29 DIAGNOSIS — S139XXA Sprain of joints and ligaments of unspecified parts of neck, initial encounter: Secondary | ICD-10-CM | POA: Diagnosis not present

## 2012-06-29 DIAGNOSIS — I10 Essential (primary) hypertension: Secondary | ICD-10-CM | POA: Insufficient documentation

## 2012-06-29 DIAGNOSIS — M47812 Spondylosis without myelopathy or radiculopathy, cervical region: Secondary | ICD-10-CM

## 2012-06-29 DIAGNOSIS — Z8719 Personal history of other diseases of the digestive system: Secondary | ICD-10-CM | POA: Diagnosis not present

## 2012-06-29 DIAGNOSIS — Z79899 Other long term (current) drug therapy: Secondary | ICD-10-CM | POA: Insufficient documentation

## 2012-06-29 DIAGNOSIS — Z87891 Personal history of nicotine dependence: Secondary | ICD-10-CM | POA: Insufficient documentation

## 2012-06-29 DIAGNOSIS — I5032 Chronic diastolic (congestive) heart failure: Secondary | ICD-10-CM | POA: Diagnosis not present

## 2012-06-29 DIAGNOSIS — Y939 Activity, unspecified: Secondary | ICD-10-CM | POA: Insufficient documentation

## 2012-06-29 DIAGNOSIS — X58XXXA Exposure to other specified factors, initial encounter: Secondary | ICD-10-CM | POA: Insufficient documentation

## 2012-06-29 DIAGNOSIS — S161XXA Strain of muscle, fascia and tendon at neck level, initial encounter: Secondary | ICD-10-CM

## 2012-06-29 DIAGNOSIS — M5030 Other cervical disc degeneration, unspecified cervical region: Secondary | ICD-10-CM | POA: Diagnosis not present

## 2012-06-29 MED ORDER — HYDROCODONE-ACETAMINOPHEN 5-325 MG PO TABS: ORAL_TABLET | ORAL | Status: DC

## 2012-06-29 MED ORDER — HYDROCODONE-ACETAMINOPHEN 5-325 MG PO TABS
2.0000 | ORAL_TABLET | Freq: Once | ORAL | Status: AC
  Administered 2012-06-29: 2 via ORAL
  Filled 2012-06-29: qty 2

## 2012-06-29 NOTE — ED Notes (Signed)
Patient transported to X-ray 

## 2012-06-29 NOTE — ED Notes (Signed)
Pt from home complaining of generalized neck pain. Denies trauma or injury.

## 2012-06-29 NOTE — ED Notes (Signed)
Ortho called for C-Collar

## 2012-06-29 NOTE — ED Provider Notes (Addendum)
History     CSN: [id number]  Arrival date & time 06/29/12  4401   First MD Initiated Contact with Patient 06/29/12 0825      Chief Complaint  Patient presents with  . Neck Pain    (Consider location/radiation/quality/duration/timing/severity/associated sxs/prior treatment) HPI Comments: Pt reports mild non traumatic pain to posterior neck about 2-3 weeks ago that has steadily been worsening, now much worse in past 2 days, unable to move neck due to severe pain, wasn't able to get comfortable and can't sleep last night so came to the ED today.  Was supposed to follow up with renal physical yesterday as she is a transplant patient and missed it due to neck being so uncomfortable.  No HA, no numbness or weakness of arms or legs, no urinary difficulty, no rash or fevers.  Pain is all the way around neck, but mostly in the posterior at base where neck meets shoulders.    Patient is a 78 y.o. female presenting with neck pain. The history is provided by the patient.  Neck Pain  Pertinent negatives include no photophobia, no numbness, no headaches and no weakness.    Past Medical History  Diagnosis Date  . S/P kidney transplant 2003    due to FSGN, follows with Dr. Briant Cedar  . Coronary artery disease   . Hypertension   . Cholelithiasis   . Diastolic CHF   . Gout     unconfirmed by joint aspiration    Past Surgical History  Procedure Date  . Kidney transplant   . Abdominal hysterectomy     History reviewed. No pertinent family history.  History  Substance Use Topics  . Smoking status: Former Smoker    Quit date: 04/19/1969  . Smokeless tobacco: Never Used  . Alcohol Use: No    OB History    Grav Para Term Preterm Abortions TAB SAB Ect Mult Living                  Review of Systems  Constitutional: Negative for fever and chills.  HENT: Positive for neck pain and neck stiffness.   Eyes: Negative for photophobia.  Respiratory: Negative for shortness of breath.     Musculoskeletal: Negative for back pain.  Skin: Negative for rash.  Neurological: Negative for weakness, numbness and headaches.  All other systems reviewed and are negative.    Allergies  Sulfa drugs cross reactors  Home Medications   Current Outpatient Rx  Name  Route  Sig  Dispense  Refill  . ACETAMINOPHEN 325 MG PO TABS   Oral   Take 650 mg by mouth every 6 (six) hours as needed. For pain         . ALLOPURINOL 100 MG PO TABS   Oral   Take 100 mg by mouth at bedtime.         Marland Kitchen AMLODIPINE BESYLATE 10 MG PO TABS   Oral   Take 10 mg by mouth Daily.         . ASPIRIN EC 81 MG PO TBEC   Oral   Take 81 mg by mouth daily.         Marland Kitchen CINACALCET HCL 30 MG PO TABS   Oral   Take 30 mg by mouth every other day.          . COLCHICINE 0.6 MG PO TABS   Oral   Take 1 tablet (0.6 mg total) by mouth daily.   7 tablet   0   .  DOCUSATE SODIUM 100 MG PO CAPS   Oral   Take 100 mg by mouth 2 (two) times daily as needed. For constipation         . FAMOTIDINE 20 MG PO TABS   Oral   Take 20 mg by mouth 2 (two) times daily.         . FUROSEMIDE 80 MG PO TABS   Oral   Take 80 mg by mouth daily.         Marland Kitchen METOPROLOL TARTRATE 50 MG PO TABS   Oral   Take 100 mg by mouth 2 (two) times daily.         Marland Kitchen MYCOPHENOLATE MOFETIL 250 MG PO CAPS   Oral   Take 500 mg by mouth 2 (two) times daily.         Marland Kitchen SIMVASTATIN 20 MG PO TABS   Oral   Take 20 mg by mouth at bedtime.          Marland Kitchen TACROLIMUS 1 MG PO CAPS   Oral   Take 2 mg by mouth 2 (two) times daily.          Marland Kitchen HYDROCODONE-ACETAMINOPHEN 5-325 MG PO TABS      1-2 tablets po q 6 hours prn moderate to severe pain   20 tablet   0     BP 139/46  Pulse 60  Temp 97.7 F (36.5 C) (Oral)  Resp 20  Ht 5\' 3"  (1.6 m)  Wt 160 lb (72.576 kg)  BMI 28.34 kg/m2  SpO2 99%  Physical Exam  Nursing note and vitals reviewed. Constitutional: She is oriented to person, place, and time. She appears well-developed  and well-nourished.  HENT:  Head: Normocephalic and atraumatic.  Eyes: EOM are normal. No scleral icterus.  Neck: Trachea normal and phonation normal. Neck supple. Muscular tenderness present. No spinous process tenderness present. Carotid bruit is not present. No rigidity. No tracheal deviation present.  Cardiovascular: Normal rate and regular rhythm.   Pulmonary/Chest: Effort normal. No stridor.  Abdominal: Soft.  Musculoskeletal: She exhibits no edema and no tenderness.       Cervical back: She exhibits decreased range of motion, tenderness, pain and spasm. She exhibits no bony tenderness, no swelling, no edema, no deformity and no laceration.       Thoracic back: Normal.  Lymphadenopathy:    She has no cervical adenopathy.  Neurological: She is alert and oriented to person, place, and time. She displays normal reflexes. No cranial nerve deficit or sensory deficit. She exhibits normal muscle tone. Coordination normal.  Skin: Skin is warm. No rash noted.  Psychiatric: She has a normal mood and affect.    ED Course  Procedures (including critical care time)  Labs Reviewed - No data to display Dg Cervical Spine 2-3 Views  06/29/2012  *RADIOLOGY REPORT*  Clinical Data: Neck pain for 3 weeks, no known injury  CERVICAL SPINE - 2-3 VIEW  Comparison: Cervical spine CT - 07/09/2006; Chest CT - 04/21/2012  Findings:  C1 to the superior endplate of T1 is visualized on the lateral radiograph.  There is unchanged mild straightening of the expected cervical lordosis.  No definite anterolisthesis or retrolisthesis. The dens is normally positioned between the lateral masses of C1.  Cervical vertebral body heights are preserved.  Prevertebral soft tissues are normal.  Grossly unchanged mild to moderate multilevel cervical spine DDD, worse at C5 - C6 and to a lesser extent, C4 - C5 and C6 - C7, with disc  space height loss, endplate sclerosis irregularity and minimal posteriorly directed disc osteophyte  complexes at these levels.  Calcifications overlying the expected location of the bilateral carotid bulbs.  Regional soft tissues are otherwise normal.  Limited visualization of the lung apices demonstrates minimal symmetric interstitial thickening, presumably accentuated due to technique.  Atherosclerotic calcifications within the aortic arch.  IMPRESSION: 1.  Grossly unchanged mild to moderate multilevel cervical spine DDD, worse at C5 - C6. 2.  Atherosclerotic calcifications within the bilateral carotid bulbs.  Further evaluation with non emergent carotid Doppler ultrasound may be performed as clinically indicated.   Original Report Authenticated By: Tacey Ruiz, MD      1. Cervical strain   2. Cervical arthritis      ra sat is 99% and i interpret to be normal   10:01 AM Contacted [facility] to provide close follow up.  Pt is feeling somewhat improved.  Non focal deficits with sensation or strength in UE.   MDM   no focal neuro deficits, doubt CVA, SAH, mass effect.  Non focal neuro.  Gradual onset over weeks.  Will give Norco which pt has had in the past for pain, check plain film for any gross abn.  I suspect pt has some arthritis.  Will need close follow up with PCP.  Will provide soft collar for comfort.        Gavin Pound. Oletta Lamas, MD 06/29/12 1004  Gavin Pound. Haaris Metallo, MD 06/29/12 1023

## 2012-06-29 NOTE — Discharge Instructions (Signed)
 Cervical Strain Care After A cervical strain is when the muscles and ligaments in your neck have been stretched. The bones are not broken. If you had any problems moving your arms or legs immediately after the injury, even if the problem has gone away, make sure to tell this to your caregiver.  HOME CARE INSTRUCTIONS   While awake, apply ice packs to the neck or areas of pain about every 1 to 2 hours, for 15 to 20 minutes at a time. Do this for 2 days. If you were given a cervical collar for support, ask your caregiver if you may remove it for bathing or applying ice.   If given a cervical collar, wear as instructed. Do not remove any collar unless instructed by a caregiver.   Only take over-the-counter or prescription medicines for pain, discomfort, or fever as directed by your caregiver.  Recheck with the hospital or clinic after a radiologist has read your X-rays. Recheck with the hospital or clinic to make sure the initial readings are correct. Do this also to determine if you need further studies. It is your responsibility to find out your X-ray results. X-rays are sometimes repeated in one week to ten days. These are often repeated to make sure that a hairline fracture was not overlooked. Ask your caregiver how you are to find out about your radiology (X-ray) results. SEEK IMMEDIATE MEDICAL CARE IF:   You have increasing pain in your neck.   You develop difficulties swallowing or breathing.   You have numbness, weakness, or movement problems in the arms or legs.   You have difficulty walking.   You develop bowel or bladder retention or incontinence.   You have problems with walking.  MAKE SURE YOU:   Understand these instructions.   Will watch your condition.   Will get help right away if you are not doing well or get worse.  Document Released: 07/19/2005 Document Revised: 03/31/2011 Document Reviewed: 03/01/2008 [facility] Patient Information 2012 [place], [place].    Narcotic  and benzodiazepine use may cause drowsiness, slowed breathing or dependence.  Please use with caution and do not drive, operate machinery or watch young children alone while taking them.  Taking combinations of these medications or drinking alcohol will potentiate these effects.

## 2012-07-05 ENCOUNTER — Ambulatory Visit (INDEPENDENT_AMBULATORY_CARE_PROVIDER_SITE_OTHER): Payer: Medicare Other | Admitting: Internal Medicine

## 2012-07-05 VITALS — BP 139/64 | HR 58 | Temp 97.0°F | Ht 60.0 in | Wt 165.6 lb

## 2012-07-05 DIAGNOSIS — M62838 Other muscle spasm: Secondary | ICD-10-CM | POA: Diagnosis not present

## 2012-07-05 MED ORDER — CYCLOBENZAPRINE HCL 5 MG PO TABS: 5.0000 mg | ORAL_TABLET | Freq: Every day | ORAL | Status: AC

## 2012-07-05 NOTE — Patient Instructions (Addendum)
Please take Flexeril at bedtime for 5 days.  Please do not use your neck brace for more than 3 hours at a time. Stop using the brace altogether after 2 weeks of use.  We would like to see you in 2 weeks for a follow up visit.  It was a pleasure seeing you today at the outpatient clinic. Muscle Strain Muscle strain occurs when a muscle is stretched beyond its normal length. A small number of muscle fibers generally are torn. This is especially common in athletes. This happens when a sudden, violent force placed on a muscle stretches it too far. Usually, recovery from muscle strain takes 1 to 2 weeks. Complete healing will take 5 to 6 weeks.  HOME CARE INSTRUCTIONS   While awake, apply ice to the sore muscle for the first 2 days after the injury.  Put ice in a plastic bag.  Place a towel between your skin and the bag.  Leave the ice on for 15 to 20 minutes each hour.  Do not use the strained muscle for several days, until you no longer have pain.  You may wrap the injured area with an elastic bandage for comfort. Be careful not to wrap it too tightly. This may interfere with blood circulation or increase swelling.  Only take over-the-counter or prescription medicines for pain, discomfort, or fever as directed by your caregiver. SEEK MEDICAL CARE IF:  You have increasing pain or swelling in the injured area. MAKE SURE YOU:   Understand these instructions.  Will watch your condition.  Will get help right away if you are not doing well or get worse. Document Released: 07/19/2005 Document Revised: 10/11/2011 Document Reviewed: 07/31/2011 [facility] Patient Information 2013 [place], [place].   Degenerative Disk Disease Degenerative disk disease is a condition caused by the changes that occur in the cushions of the backbone (spinal disks) as you grow older. Spinal disks are soft and compressible disks located between the bones of the spine (vertebrae). They act like shock absorbers.  Degenerative disk disease can affect the whole spine. However, the neck and lower back are most commonly affected. Many changes can occur in the spinal disks with aging, such as:  The spinal disks may dry and shrink.  Small tears may occur in the tough, outer covering of the disk (annulus).  The disk space may become smaller due to loss of water.  Abnormal growths in the bone (spurs) may occur. This can put pressure on the nerve roots exiting the spinal canal, causing pain.  The spinal canal may become narrowed. CAUSES  Degenerative disk disease is a condition caused by the changes that occur in the spinal disks with aging. The exact cause is not known, but there is a genetic basis for many patients. Degenerative changes can occur due to loss of fluid in the disk. This makes the disk thinner and reduces the space between the backbones. Small cracks can develop in the outer layer of the disk. This can lead to the breakdown of the disk. You are more likely to get degenerative disk disease if you are overweight. Smoking cigarettes and doing heavy work such as weightlifting can also increase your risk of this condition. Degenerative changes can start after a sudden injury. Growth of bone spurs can compress the nerve roots and cause pain.  SYMPTOMS  The symptoms vary from person to person. Some people may have no pain, while others have severe pain. The pain may be so severe that it can limit your  activities. The location of the pain depends on the part of your backbone that is affected. You will have neck or arm pain if a disk in the neck area is affected. You will have pain in your back, buttocks, or legs if a disk in the lower back is affected. The pain becomes worse while bending, reaching up, or with twisting movements. The pain may start gradually and then get worse as time passes. It may also start after a major or minor injury. You may feel numbness or tingling in the arms or legs.  DIAGNOSIS   Your caregiver will ask you about your symptoms and about activities or habits that may cause the pain. He or she may also ask about any injuries, diseases, or treatments you have had earlier. Your caregiver will examine you to check for the range of movement that is possible in the affected area, to check for strength in your extremities, and to check for sensation in the areas of the arms and legs supplied by different nerve roots. An X-ray of the spine may be taken. Your caregiver may suggest other imaging tests, such as magnetic resonance imaging (MRI), if needed.  TREATMENT  Treatment includes rest, modifying your activities, and applying ice and heat. Your caregiver may prescribe medicines to reduce your pain and may ask you to do some exercises to strengthen your back. In some cases, you may need surgery. You and your caregiver will decide on the treatment that is best for you. HOME CARE INSTRUCTIONS   Follow proper lifting and walking techniques as advised by your caregiver.  Maintain good posture.  Exercise regularly as advised.  Perform relaxation exercises.  Change your sitting, standing, and sleeping habits as advised. Change positions frequently.  Lose weight as advised.  Stop smoking if you smoke.  Wear supportive footwear. SEEK MEDICAL CARE IF:  Your pain does not go away within 1 to 4 weeks. SEEK IMMEDIATE MEDICAL CARE IF:   Your pain is severe.  You notice weakness in your arms, hands, or legs.  You begin to lose control of your bladder or bowel movements. MAKE SURE YOU:   Understand these instructions.  Will watch your condition.  Will get help right away if you are not doing well or get worse. Document Released: 05/16/2007 Document Revised: 10/11/2011 Document Reviewed: 05/16/2007 [facility] Patient Information 2013 [place], [place].

## 2012-07-05 NOTE — Progress Notes (Signed)
Subjective:     Patient ID: [name], female   DOB: 10/14/1931, 78 y.o.   MRN: [id number]  HPI 78 year old female with a PMH of CAD, HTN, diastolic CHF, recently went to the ED for severe neck pain on 06/29/12 and she presents here for a follow up visit. An Xray was done during the ED visit which showed evidence of multilevel cervical spine degenerative disc disease and was discharged with a cervical strain. She reported no traumatic events before the onset of her pain. She was given Hydrocodone-Acetaminophen which she has been taking and the pain is controlled. The medication is also making her very sleepy. She denies chest pain, headache, fever, or shortness of breath. The pain has radiated up her neck and down her back, but that radiation is not constant. She denies any radiation down her arms and any other neurological symptoms. She also complains of severe fatigue for the past 6 months and a loss of appetite. TSH was within normal limits on 04/19/12. She was given a cervical collar in the ED, which helps when she moves around and sleeps, but not when sitting/watching TV.  Past Medical History  Diagnosis Date  . S/P kidney transplant Dec 16, 2001    due to FSGN, follows with Dr. Briant Cedar  . Coronary artery disease   . Hypertension   . Cholelithiasis   . Diastolic CHF   . Gout     unconfirmed by joint aspiration   Past Surgical History  Procedure Date  . Kidney transplant   . Abdominal hysterectomy    No family history on file. History   Social History  . Marital Status: Divorced    Spouse Name: N/A    Number of Children: N/A  . Years of Education: N/A   Social History Main Topics  . Smoking status: Former Smoker    Quit date: 04/19/1969  . Smokeless tobacco: Never Used  . Alcohol Use: No  . Drug Use: No  . Sexually Active: None   Other Topics Concern  . None   Social History Narrative   Patient's husband died in 16-Dec-2008.   Review of Systems  Constitutional: Positive for  activity change (decreased), appetite change (decreased) and fatigue. Negative for fever.  HENT: Positive for neck pain.   Eyes: Negative.   Respiratory: Negative.   Cardiovascular: Negative.   Gastrointestinal: Negative.   Genitourinary: Negative.   Skin: Negative.   Neurological: Negative.   Psychiatric/Behavioral: Negative.        Objective:   Physical Exam  Constitutional: She is oriented to person, place, and time. She appears well-developed and well-nourished. No distress.  HENT:  Head: Normocephalic and atraumatic.  Eyes: Conjunctivae normal are normal. Pupils are equal, round, and reactive to light. Right eye exhibits no discharge.  Neck: Muscular tenderness present. Decreased range of motion (2/2 pain. Unable to do lateral movement or flexion/extension) present.    Cardiovascular: Normal rate, regular rhythm, normal heart sounds and intact distal pulses.  Exam reveals no gallop and no friction rub.   No murmur heard. Pulmonary/Chest: Effort normal and breath sounds normal. No respiratory distress. She has no wheezes. She has no rales. She exhibits no tenderness.  Abdominal: Soft. Bowel sounds are normal. She exhibits no distension. There is no tenderness. There is no rebound and no guarding.  Neurological: She is alert and oriented to person, place, and time.  Skin: No rash noted. She is not diaphoretic. No erythema. No pallor.  Psychiatric: She has a normal  mood and affect.       Assessment/Plan:     78 year old woman with degenerative disc disease presents to the clinic after an ED visit for neck pain.     1. Trapezius Muscle Strain with Degenerative disc disease - Muscle relaxant: Flexeril 5mg  qhs x 5 days - Physical Therapy if pain does not resolve - Recommended not using the cervical collar for longer than 3 hours at a time, and not longer than 2 weeks. - Follow up in 2 weeks to the outpatient clinic.

## 2012-07-05 NOTE — Progress Notes (Signed)
Patient ID: [name], female   DOB: 09/23/1931, 78 y.o.   MRN: [id number]  80 yr. Old female with pmhx significant for HTN, ESRD s/p renal transplant due to FSGN, chronic diastolic CHF, CAD, presents due to neck pain. She reports slow onset neck pain about 3 weeks ago with no related trauma. She denies any recent MVA or falls. She was seen in the ED and discharged home with a diagnosis of cervical strain an given Norco for pain.  A cervical spine xray showed multilevel DDD worse at C5-C6. She initially felt this may have due to "the way she was laying", but it hasn't improved.  She denies tingling, numbness, weakness of bilateral upper extremities.  She denies any weakness of her LE.  She remembers waking up one night with neck pain/spasm and it just has not resolved since then.   Filed Vitals:   07/05/12 1518  BP: 139/64  Pulse: 58  Temp: 97 F (36.1 C)   GEN: AAOx3, NAD. HEENT: EOMI, PERRLA, no icterus. CV: S1S2, no m/r/g, RRR PULM: CTA bilat UE: 5/5 strength bilat UE. MUSK: tenderness throughout upper trapezius muscle with multiple tender points and "ropiness". She has limited AROM of her neck on lateral rotation mostly to the left.   A/P:  80 yr. Old female with pmhx significant for HTN, ESRD s/p renal transplant due to FSGN, chronic diastolic CHF, CAD, presents due to neck pain. 1) Trapezius spasm: At this time, I don't feel her DDD around cervical spine is contributing to her pain. I will give her a low dose course of flexeril 5 mg daily to take at night before bed. Discontinue Norco. She can return for follow up in two weeks to re-evaluate. She may need PT if does not resolve.  Jonah Blue

## 2012-07-06 ENCOUNTER — Ambulatory Visit: Payer: Medicare Other | Admitting: Radiation Oncology

## 2012-07-13 DIAGNOSIS — E785 Hyperlipidemia, unspecified: Secondary | ICD-10-CM | POA: Diagnosis not present

## 2012-07-17 DIAGNOSIS — Z79899 Other long term (current) drug therapy: Secondary | ICD-10-CM | POA: Diagnosis not present

## 2012-07-17 DIAGNOSIS — N183 Chronic kidney disease, stage 3 unspecified: Secondary | ICD-10-CM | POA: Diagnosis not present

## 2012-07-17 DIAGNOSIS — N2581 Secondary hyperparathyroidism of renal origin: Secondary | ICD-10-CM | POA: Diagnosis not present

## 2012-07-17 DIAGNOSIS — Z940 Kidney transplant status: Secondary | ICD-10-CM | POA: Diagnosis not present

## 2012-07-27 DIAGNOSIS — Z79899 Other long term (current) drug therapy: Secondary | ICD-10-CM | POA: Diagnosis not present

## 2012-10-24 DIAGNOSIS — Z79899 Other long term (current) drug therapy: Secondary | ICD-10-CM | POA: Diagnosis not present

## 2012-10-24 DIAGNOSIS — Z940 Kidney transplant status: Secondary | ICD-10-CM | POA: Diagnosis not present

## 2012-10-24 DIAGNOSIS — N2581 Secondary hyperparathyroidism of renal origin: Secondary | ICD-10-CM | POA: Diagnosis not present

## 2012-10-24 DIAGNOSIS — D649 Anemia, unspecified: Secondary | ICD-10-CM | POA: Diagnosis not present

## 2012-12-12 DIAGNOSIS — Z940 Kidney transplant status: Secondary | ICD-10-CM | POA: Diagnosis not present

## 2012-12-12 DIAGNOSIS — Z79899 Other long term (current) drug therapy: Secondary | ICD-10-CM | POA: Diagnosis not present

## 2013-04-30 ENCOUNTER — Encounter: Payer: Self-pay | Admitting: Internal Medicine

## 2013-05-08 DIAGNOSIS — Z79899 Other long term (current) drug therapy: Secondary | ICD-10-CM | POA: Diagnosis not present

## 2013-05-08 DIAGNOSIS — I10 Essential (primary) hypertension: Secondary | ICD-10-CM | POA: Diagnosis not present

## 2013-05-08 DIAGNOSIS — Z48298 Encounter for aftercare following other organ transplant: Secondary | ICD-10-CM | POA: Diagnosis not present

## 2013-05-08 DIAGNOSIS — Z940 Kidney transplant status: Secondary | ICD-10-CM | POA: Diagnosis not present

## 2013-05-08 DIAGNOSIS — N032 Chronic nephritic syndrome with diffuse membranous glomerulonephritis: Secondary | ICD-10-CM | POA: Diagnosis not present

## 2013-05-16 DIAGNOSIS — N2581 Secondary hyperparathyroidism of renal origin: Secondary | ICD-10-CM | POA: Diagnosis not present

## 2013-05-16 DIAGNOSIS — D649 Anemia, unspecified: Secondary | ICD-10-CM | POA: Diagnosis not present

## 2013-05-16 DIAGNOSIS — M109 Gout, unspecified: Secondary | ICD-10-CM | POA: Diagnosis not present

## 2013-05-16 DIAGNOSIS — E785 Hyperlipidemia, unspecified: Secondary | ICD-10-CM | POA: Diagnosis not present

## 2013-05-16 DIAGNOSIS — Z940 Kidney transplant status: Secondary | ICD-10-CM | POA: Diagnosis not present

## 2013-05-16 DIAGNOSIS — Z79899 Other long term (current) drug therapy: Secondary | ICD-10-CM | POA: Diagnosis not present

## 2013-05-21 DIAGNOSIS — Z79899 Other long term (current) drug therapy: Secondary | ICD-10-CM | POA: Diagnosis not present

## 2013-05-21 DIAGNOSIS — Z940 Kidney transplant status: Secondary | ICD-10-CM | POA: Diagnosis not present

## 2013-05-21 DIAGNOSIS — Z23 Encounter for immunization: Secondary | ICD-10-CM | POA: Diagnosis not present

## 2013-05-21 DIAGNOSIS — N183 Chronic kidney disease, stage 3 unspecified: Secondary | ICD-10-CM | POA: Diagnosis not present

## 2013-07-07 ENCOUNTER — Emergency Department (HOSPITAL_COMMUNITY): Payer: Medicare Other

## 2013-07-07 ENCOUNTER — Encounter (HOSPITAL_COMMUNITY): Payer: Self-pay | Admitting: Emergency Medicine

## 2013-07-07 ENCOUNTER — Inpatient Hospital Stay (HOSPITAL_COMMUNITY)
Admission: EM | Admit: 2013-07-07 | Discharge: 2013-07-11 | DRG: 291 | Disposition: A | Discharge status: Home / Self Care | Payer: Medicare Other | Attending: Internal Medicine | Admitting: Internal Medicine

## 2013-07-07 DIAGNOSIS — Z79899 Other long term (current) drug therapy: Secondary | ICD-10-CM | POA: Diagnosis not present

## 2013-07-07 DIAGNOSIS — I517 Cardiomegaly: Secondary | ICD-10-CM | POA: Diagnosis not present

## 2013-07-07 DIAGNOSIS — I10 Essential (primary) hypertension: Secondary | ICD-10-CM | POA: Diagnosis present

## 2013-07-07 DIAGNOSIS — I2510 Atherosclerotic heart disease of native coronary artery without angina pectoris: Secondary | ICD-10-CM | POA: Diagnosis not present

## 2013-07-07 DIAGNOSIS — R0989 Other specified symptoms and signs involving the circulatory and respiratory systems: Secondary | ICD-10-CM | POA: Diagnosis not present

## 2013-07-07 DIAGNOSIS — Z940 Kidney transplant status: Secondary | ICD-10-CM

## 2013-07-07 DIAGNOSIS — I509 Heart failure, unspecified: Secondary | ICD-10-CM | POA: Diagnosis not present

## 2013-07-07 DIAGNOSIS — Z87891 Personal history of nicotine dependence: Secondary | ICD-10-CM

## 2013-07-07 DIAGNOSIS — J9819 Other pulmonary collapse: Secondary | ICD-10-CM | POA: Diagnosis not present

## 2013-07-07 DIAGNOSIS — R899 Unspecified abnormal finding in specimens from other organs, systems and tissues: Secondary | ICD-10-CM | POA: Diagnosis not present

## 2013-07-07 DIAGNOSIS — I129 Hypertensive chronic kidney disease with stage 1 through stage 4 chronic kidney disease, or unspecified chronic kidney disease: Secondary | ICD-10-CM | POA: Diagnosis present

## 2013-07-07 DIAGNOSIS — Z7982 Long term (current) use of aspirin: Secondary | ICD-10-CM | POA: Diagnosis not present

## 2013-07-07 DIAGNOSIS — J90 Pleural effusion, not elsewhere classified: Secondary | ICD-10-CM

## 2013-07-07 DIAGNOSIS — I5033 Acute on chronic diastolic (congestive) heart failure: Principal | ICD-10-CM | POA: Diagnosis present

## 2013-07-07 DIAGNOSIS — I5030 Unspecified diastolic (congestive) heart failure: Secondary | ICD-10-CM | POA: Diagnosis not present

## 2013-07-07 DIAGNOSIS — M109 Gout, unspecified: Secondary | ICD-10-CM | POA: Diagnosis present

## 2013-07-07 DIAGNOSIS — J9601 Acute respiratory failure with hypoxia: Secondary | ICD-10-CM

## 2013-07-07 DIAGNOSIS — R0602 Shortness of breath: Secondary | ICD-10-CM | POA: Diagnosis not present

## 2013-07-07 DIAGNOSIS — J9600 Acute respiratory failure, unspecified whether with hypoxia or hypercapnia: Secondary | ICD-10-CM | POA: Diagnosis present

## 2013-07-07 DIAGNOSIS — N189 Chronic kidney disease, unspecified: Secondary | ICD-10-CM | POA: Diagnosis present

## 2013-07-07 LAB — URINALYSIS, ROUTINE W REFLEX MICROSCOPIC
Bilirubin Urine: NEGATIVE
Nitrite: NEGATIVE
Protein, ur: NEGATIVE mg/dL
Specific Gravity, Urine: 1.008 (ref 1.005–1.030)
Urobilinogen, UA: 0.2 mg/dL (ref 0.0–1.0)

## 2013-07-07 LAB — CBC
HCT: 39.8 % (ref 36.0–46.0)
Hemoglobin: 13.1 g/dL (ref 12.0–15.0)
MCH: 29.6 pg (ref 26.0–34.0)
MCV: 90 fL (ref 78.0–100.0)
RBC: 4.42 MIL/uL (ref 3.87–5.11)
WBC: 7.3 10*3/uL (ref 4.0–10.5)

## 2013-07-07 LAB — BASIC METABOLIC PANEL
BUN: 21 mg/dL (ref 6–23)
CO2: 19 mEq/L (ref 19–32)
Calcium: 10.1 mg/dL (ref 8.4–10.5)
Chloride: 107 mEq/L (ref 96–112)
Creatinine, Ser: 1.12 mg/dL — ABNORMAL HIGH (ref 0.50–1.10)
Glucose, Bld: 111 mg/dL — ABNORMAL HIGH (ref 70–99)

## 2013-07-07 LAB — HEPATIC FUNCTION PANEL
Albumin: 3.7 g/dL (ref 3.5–5.2)
Alkaline Phosphatase: 132 U/L — ABNORMAL HIGH (ref 39–117)
Bilirubin, Direct: 0.1 mg/dL (ref 0.0–0.3)
Indirect Bilirubin: 0.4 mg/dL (ref 0.3–0.9)
Total Protein: 7 g/dL (ref 6.0–8.3)

## 2013-07-07 LAB — URINE MICROSCOPIC-ADD ON

## 2013-07-07 LAB — POCT I-STAT TROPONIN I: Troponin i, poc: 0.04 ng/mL (ref 0.00–0.08)

## 2013-07-07 LAB — PRO B NATRIURETIC PEPTIDE: Pro B Natriuretic peptide (BNP): 2132 pg/mL — ABNORMAL HIGH (ref 0–450)

## 2013-07-07 MED ORDER — SIMVASTATIN 20 MG PO TABS
20.0000 mg | ORAL_TABLET | Freq: Every day | ORAL | Status: DC
  Administered 2013-07-08 – 2013-07-10 (×4): 20 mg via ORAL
  Filled 2013-07-07 (×5): qty 1

## 2013-07-07 MED ORDER — HEPARIN SODIUM (PORCINE) 5000 UNIT/ML IJ SOLN
5000.0000 [IU] | Freq: Three times a day (TID) | INTRAMUSCULAR | Status: DC
  Administered 2013-07-08 – 2013-07-11 (×10): 5000 [IU] via SUBCUTANEOUS
  Filled 2013-07-07 (×13): qty 1

## 2013-07-07 MED ORDER — AMLODIPINE BESYLATE 10 MG PO TABS
10.0000 mg | ORAL_TABLET | Freq: Every day | ORAL | Status: DC
  Administered 2013-07-08 – 2013-07-11 (×4): 10 mg via ORAL
  Filled 2013-07-07 (×4): qty 1

## 2013-07-07 MED ORDER — SODIUM CHLORIDE 0.9 % IJ SOLN
3.0000 mL | Freq: Two times a day (BID) | INTRAMUSCULAR | Status: DC
  Administered 2013-07-07 – 2013-07-10 (×7): 3 mL via INTRAVENOUS

## 2013-07-07 MED ORDER — ASPIRIN EC 81 MG PO TBEC
81.0000 mg | DELAYED_RELEASE_TABLET | Freq: Every day | ORAL | Status: DC
  Administered 2013-07-08 – 2013-07-11 (×4): 81 mg via ORAL
  Filled 2013-07-07 (×4): qty 1

## 2013-07-07 MED ORDER — TACROLIMUS 1 MG PO CAPS
2.0000 mg | ORAL_CAPSULE | Freq: Two times a day (BID) | ORAL | Status: DC
  Administered 2013-07-08 – 2013-07-11 (×8): 2 mg via ORAL
  Filled 2013-07-07 (×10): qty 2

## 2013-07-07 MED ORDER — LOSARTAN POTASSIUM 50 MG PO TABS
100.0000 mg | ORAL_TABLET | Freq: Every day | ORAL | Status: DC
  Administered 2013-07-08 – 2013-07-10 (×3): 100 mg via ORAL
  Filled 2013-07-07 (×3): qty 2

## 2013-07-07 MED ORDER — FUROSEMIDE 10 MG/ML IJ SOLN
40.0000 mg | Freq: Once | INTRAMUSCULAR | Status: DC
  Filled 2013-07-07: qty 4

## 2013-07-07 MED ORDER — ONDANSETRON HCL 4 MG PO TABS: 4.0000 mg | ORAL_TABLET | Freq: Four times a day (QID) | ORAL | Status: DC | PRN

## 2013-07-07 MED ORDER — METOPROLOL TARTRATE 100 MG PO TABS
100.0000 mg | ORAL_TABLET | Freq: Two times a day (BID) | ORAL | Status: DC
  Administered 2013-07-08 – 2013-07-10 (×5): 100 mg via ORAL
  Filled 2013-07-07 (×7): qty 1

## 2013-07-07 MED ORDER — ACETAMINOPHEN 325 MG PO TABS
650.0000 mg | ORAL_TABLET | Freq: Four times a day (QID) | ORAL | Status: DC | PRN
  Administered 2013-07-10: 650 mg via ORAL
  Filled 2013-07-07: qty 2

## 2013-07-07 MED ORDER — MYCOPHENOLATE MOFETIL 250 MG PO CAPS
500.0000 mg | ORAL_CAPSULE | Freq: Two times a day (BID) | ORAL | Status: DC
  Administered 2013-07-08 – 2013-07-11 (×8): 500 mg via ORAL
  Filled 2013-07-07 (×9): qty 2

## 2013-07-07 MED ORDER — FUROSEMIDE 10 MG/ML IJ SOLN
80.0000 mg | Freq: Once | INTRAMUSCULAR | Status: AC
  Administered 2013-07-07: 80 mg via INTRAVENOUS
  Filled 2013-07-07: qty 8

## 2013-07-07 MED ORDER — MORPHINE SULFATE 2 MG/ML IJ SOLN: 1.0000 mg | INTRAMUSCULAR | Status: DC | PRN

## 2013-07-07 MED ORDER — SODIUM CHLORIDE 0.9 % IV SOLN
INTRAVENOUS | Status: DC
  Administered 2013-07-07: 18:00:00 via INTRAVENOUS

## 2013-07-07 MED ORDER — CINACALCET HCL 30 MG PO TABS
30.0000 mg | ORAL_TABLET | ORAL | Status: DC
  Administered 2013-07-08 – 2013-07-10 (×2): 30 mg via ORAL
  Filled 2013-07-07 (×3): qty 1

## 2013-07-07 MED ORDER — ONDANSETRON HCL 4 MG/2ML IJ SOLN: 4.0000 mg | Freq: Four times a day (QID) | INTRAMUSCULAR | Status: DC | PRN

## 2013-07-07 MED ORDER — FAMOTIDINE 20 MG PO TABS
20.0000 mg | ORAL_TABLET | Freq: Two times a day (BID) | ORAL | Status: DC
  Administered 2013-07-08 – 2013-07-10 (×6): 20 mg via ORAL
  Filled 2013-07-07 (×7): qty 1

## 2013-07-07 MED ORDER — FUROSEMIDE 80 MG PO TABS
80.0000 mg | ORAL_TABLET | Freq: Two times a day (BID) | ORAL | Status: DC
  Administered 2013-07-08 – 2013-07-09 (×3): 80 mg via ORAL
  Filled 2013-07-07 (×5): qty 1

## 2013-07-07 MED ORDER — ALLOPURINOL 100 MG PO TABS
100.0000 mg | ORAL_TABLET | Freq: Every day | ORAL | Status: DC
  Administered 2013-07-08 – 2013-07-10 (×4): 100 mg via ORAL
  Filled 2013-07-07 (×5): qty 1

## 2013-07-07 NOTE — ED Provider Notes (Addendum)
CSN: [id number]     Arrival date & time 07/07/13  1639 History   First MD Initiated Contact with Patient 07/07/13 1710     Chief Complaint  Patient presents with  . Shortness of Breath   (Consider location/radiation/quality/duration/timing/severity/associated sxs/prior Treatment) Patient is a 79 y.o. female presenting with shortness of breath. The history is provided by the patient and a relative.  Shortness of Breath Associated symptoms: chest pain   Associated symptoms: no abdominal pain, no cough, no fever, no headaches, no rash and no vomiting    the patient followed by internal medicine outpatient clinics. The patient brought in by family members. Patient with a complaint of shortness of breath since Thanksgiving has been getting progressively worse. It's much worse at night she has to sleep with her head elevated on several pillows. Reminds patient of exacerbation of her congestive heart failure. Patient also has a history of kidney transplant and is followed by [facility]. Patient with some intermittent chest pain heaviness in the chest very mild. Brief nothing lasting 15 or 20 minutes. For shortness of breath is made worse with exertion.  Past Medical History  Diagnosis Date  . S/P kidney transplant 2003    due to FSGN, follows with Dr. Briant Cedar  . Coronary artery disease   . Hypertension   . Cholelithiasis   . Diastolic CHF   . Gout     unconfirmed by joint aspiration   Past Surgical History  Procedure Laterality Date  . Kidney transplant    . Abdominal hysterectomy     History reviewed. No pertinent family history. History  Substance Use Topics  . Smoking status: Former Smoker    Quit date: 04/19/1969  . Smokeless tobacco: Never Used  . Alcohol Use: No   OB History   Grav Para Term Preterm Abortions TAB SAB Ect Mult Living                 Review of Systems  Constitutional: Negative for fever.  HENT: Negative for congestion.   Eyes: Negative for visual  disturbance.  Respiratory: Positive for shortness of breath. Negative for cough.   Cardiovascular: Positive for chest pain.  Gastrointestinal: Negative for nausea, vomiting and abdominal pain.  Genitourinary: Negative for dysuria.  Musculoskeletal: Negative for back pain.  Skin: Negative for rash.  Neurological: Negative for headaches.  Hematological: Does not bruise/bleed easily.  Psychiatric/Behavioral: Negative for confusion.    Allergies  Sulfa drugs cross reactors  Home Medications   Current Outpatient Rx  Name  Route  Sig  Dispense  Refill  . acetaminophen (TYLENOL) 325 MG tablet   Oral   Take 650 mg by mouth every 6 (six) hours as needed. For pain         . allopurinol (ZYLOPRIM) 100 MG tablet   Oral   Take 100 mg by mouth at bedtime.         Marland Kitchen amLODipine (NORVASC) 10 MG tablet   Oral   Take 10 mg by mouth Daily.         Marland Kitchen aspirin EC 81 MG tablet   Oral   Take 81 mg by mouth daily.         . cinacalcet (SENSIPAR) 30 MG tablet   Oral   Take 30 mg by mouth every other day.          . colchicine 0.6 MG tablet   Oral   Take 1 tablet (0.6 mg total) by mouth daily.   7 tablet  0   . docusate sodium (COLACE) 100 MG capsule   Oral   Take 100 mg by mouth 2 (two) times daily as needed. For constipation         . famotidine (PEPCID) 20 MG tablet   Oral   Take 20 mg by mouth 2 (two) times daily.         . furosemide (LASIX) 80 MG tablet   Oral   Take 80 mg by mouth 2 (two) times daily.          Marland Kitchen HYDROcodone-acetaminophen (NORCO/VICODIN) 5-325 MG per tablet      1-2 tablets po q 6 hours prn moderate to severe pain   20 tablet   0   . losartan (COZAAR) 100 MG tablet   Oral   Take 100 mg by mouth daily.         . metoprolol (LOPRESSOR) 50 MG tablet   Oral   Take 100 mg by mouth 2 (two) times daily.         . mycophenolate (CELLCEPT) 250 MG capsule   Oral   Take 500 mg by mouth 2 (two) times daily.         . simvastatin (ZOCOR)  20 MG tablet   Oral   Take 20 mg by mouth at bedtime.          . tacrolimus (PROGRAF) 1 MG capsule   Oral   Take 2 mg by mouth 2 (two) times daily.           BP 138/54  Pulse 53  Temp(Src) 98.8 F (37.1 C) (Oral)  Resp 18  Ht 5\' 3"  (1.6 m)  Wt 165 lb (74.844 kg)  BMI 29.24 kg/m2  SpO2 96% Physical Exam  Nursing note and vitals reviewed. Constitutional: She appears well-developed and well-nourished. No distress.  HENT:  Head: Normocephalic and atraumatic.  Mouth/Throat: Oropharynx is clear and moist.  Eyes: Conjunctivae and EOM are normal. Pupils are equal, round, and reactive to light.  Neck: Normal range of motion.  Cardiovascular: Normal rate, regular rhythm and normal heart sounds.   Pulmonary/Chest: Effort normal. She has rales.  Abdominal: Soft. Bowel sounds are normal. There is no tenderness.  Musculoskeletal: She exhibits no edema.  Neurological: She is alert. No cranial nerve deficit. She exhibits normal muscle tone. Coordination normal.  Skin: Skin is warm. No rash noted.    ED Course  Procedures (including critical care time) Labs Review Labs Reviewed  BASIC METABOLIC PANEL - Abnormal; Notable for the following:    Glucose, Bld 111 (*)    Creatinine, Ser 1.12 (*)    GFR calc non Af Amer 45 (*)    GFR calc Af Amer 53 (*)    All other components within normal limits  PRO B NATRIURETIC PEPTIDE - Abnormal; Notable for the following:    Pro B Natriuretic peptide (BNP) 2132.0 (*)    All other components within normal limits  URINALYSIS, ROUTINE W REFLEX MICROSCOPIC - Abnormal; Notable for the following:    APPearance CLOUDY (*)    Leukocytes, UA SMALL (*)    All other components within normal limits  HEPATIC FUNCTION PANEL - Abnormal; Notable for the following:    Alkaline Phosphatase 132 (*)    All other components within normal limits  CBC  URINE MICROSCOPIC-ADD ON  POCT I-STAT TROPONIN I   Results for orders placed during the hospital encounter of  07/07/13  CBC      Result Value Range  WBC 7.3  4.0 - 10.5 K/uL   RBC 4.42  3.87 - 5.11 MIL/uL   Hemoglobin 13.1  12.0 - 15.0 g/dL   HCT 56.2  13.0 - 86.5 %   MCV 90.0  78.0 - 100.0 fL   MCH 29.6  26.0 - 34.0 pg   MCHC 32.9  30.0 - 36.0 g/dL   RDW 78.4  69.6 - 29.5 %   Platelets 221  150 - 400 K/uL  BASIC METABOLIC PANEL      Result Value Range   Sodium 138  135 - 145 mEq/L   Potassium 4.5  3.5 - 5.1 mEq/L   Chloride 107  96 - 112 mEq/L   CO2 19  19 - 32 mEq/L   Glucose, Bld 111 (*) 70 - 99 mg/dL   BUN 21  6 - 23 mg/dL   Creatinine, Ser 2.84 (*) 0.50 - 1.10 mg/dL   Calcium 13.2  8.4 - 44.0 mg/dL   GFR calc non Af Amer 45 (*) >90 mL/min   GFR calc Af Amer 53 (*) >90 mL/min  PRO B NATRIURETIC PEPTIDE      Result Value Range   Pro B Natriuretic peptide (BNP) 2132.0 (*) 0 - 450 pg/mL  URINALYSIS, ROUTINE W REFLEX MICROSCOPIC      Result Value Range   Color, Urine YELLOW  YELLOW   APPearance CLOUDY (*) CLEAR   Specific Gravity, Urine 1.008  1.005 - 1.030   pH 5.5  5.0 - 8.0   Glucose, UA NEGATIVE  NEGATIVE mg/dL   Hgb urine dipstick NEGATIVE  NEGATIVE   Bilirubin Urine NEGATIVE  NEGATIVE   Ketones, ur NEGATIVE  NEGATIVE mg/dL   Protein, ur NEGATIVE  NEGATIVE mg/dL   Urobilinogen, UA 0.2  0.0 - 1.0 mg/dL   Nitrite NEGATIVE  NEGATIVE   Leukocytes, UA SMALL (*) NEGATIVE  HEPATIC FUNCTION PANEL      Result Value Range   Total Protein 7.0  6.0 - 8.3 g/dL   Albumin 3.7  3.5 - 5.2 g/dL   AST 15  0 - 37 U/L   ALT 6  0 - 35 U/L   Alkaline Phosphatase 132 (*) 39 - 117 U/L   Total Bilirubin 0.5  0.3 - 1.2 mg/dL   Bilirubin, Direct 0.1  0.0 - 0.3 mg/dL   Indirect Bilirubin 0.4  0.3 - 0.9 mg/dL  URINE MICROSCOPIC-ADD ON      Result Value Range   Squamous Epithelial / LPF RARE  RARE   WBC, UA 0-2  <3 WBC/hpf  POCT I-STAT TROPONIN I      Result Value Range   Troponin i, poc 0.04  0.00 - 0.08 ng/mL   Comment 3             Imaging Review Dg Chest 2 View  07/07/2013    CLINICAL DATA:  Shortness of Breath.  EXAM: CHEST  2 VIEW  COMPARISON:  04/25/2012.  FINDINGS: Increased bibasilar airspace opacity and pleural fluid with obscuration of the heart borders. The pulmonary vasculature remains mildly prominent. Diffuse osteopenia and mild scoliosis.  IMPRESSION: 1. Increased bibasilar atelectasis and pleural fluid. 2. Stable mild pulmonary vascular congestion.   Electronically Signed   By: Gordan Payment [name]   On: 07/07/2013 18:17   Ct Chest Wo Contrast  07/07/2013   CLINICAL DATA:  Shortness of breath. Cough and chest congestion. Chest pain.  EXAM: CT CHEST WITHOUT CONTRAST  TECHNIQUE: Multidetector CT imaging of the chest was performed following  the standard protocol without IV contrast.  COMPARISON:  Chest radiographs obtained earlier today and chest CT dated 04/21/2012.  FINDINGS: Small to moderate-sized bilateral pleural effusions. Bilateral dense lower lobe atelectasis, greater on the right. There is also atelectasis in the right middle lobe, lingula and inferior right upper lobe. No patchy airspace opacity suspicious for pneumonia is visualized.  The 5 mm nodule previously demonstrated on the minor fissure is no longer demonstrated. Currently, there is a vague area of ill-defined increased density at that location measuring 5 mm in maximum diameter on image number 30. This has an appearance compatible with a small plaque-like area of pleural thickening on sagittal image number 23. No discrete nodules are seen elsewhere in either lung at this time. No enlarged lymph nodes.  Dense coronary artery calcifications are again demonstrated. The heart is mildly enlarged. Previously described calcifications in the superior vena cava. The inferior left lobe of the thyroid gland remains enlarged, without a discrete mass visualized. Coarse, benign appearing left breast calcifications are noted. Thoracic spine degenerative changes. The included upper abdomen is unremarkable.  IMPRESSION: 1.  Small to moderate-sized bilateral pleural effusions. 2. Bilateral lower lobe, right middle lobe, lingular and inferior right upper lobe atelectasis. 3. Dense atheromatous coronary artery calcifications. 4. Previously noted calcification in the superior vena cava. 5. Stable thyroid goiter. 6. No discrete lung nodules seen today.   Electronically Signed   By: Gordan Payment [name]   On: 07/07/2013 19:50    EKG Interpretation   None      Date: 07/07/2013  Rate: 52  Rhythm: sinus bradycardia  QRS Axis: normal  Intervals: normal  ST/T Wave abnormalities: nonspecific T wave changes  Conduction Disutrbances:none  Narrative Interpretation:   Old EKG Reviewed: none available    MDM   1. Shortness of breath   2. Pleural effusion    Patient followed by outpatient clinics. His had been having increased shortness of breath since Thanksgiving. Patient also with occasional heaviness in her chest very intermittent lasting only seconds. Main concern is increased shortness of breath also worse at night. Patient have an elevator head frequently on pillows. Reminds patient when she's had congestive heart failure in the past. Patient is status post kidney transplant and therefore only has one kidney followed by [facility]. Patient's primary care Dr. as mentioned above his internal medicine outpatient clinics.  Chest x-ray raised concerns for some airspace opacities patient clinically does not seem to be having a history consistent with pneumonia. CT chest ordered to further delineate. Patient takes 80 mg of Lasix orally once a day here given additional 80 mg of Lasix IV.   Patient's EKG without any acute changes suggestive of acute cardiac injury. First troponin is negative.  CT of the chest shows moderate bilateral pleural effusions this is probably responsible for the patient's shortness of breath. Does not show pulmonary edema or pneumonia. We'll discuss with the internal medicine outpatient clinics  about admission.  Shelda Jakes, MD 07/07/13 1940  Shelda Jakes, MD 07/09/13 [phone]

## 2013-07-07 NOTE — ED Notes (Signed)
Pt reports she started feeling SOB on Thanksgiving and has continued since then. Reports she also has intermittent heaviness in her chest. Reports increased SOB with movement and sleeps with her head elevated at night with pillows.

## 2013-07-07 NOTE — H&P (Signed)
Date: 07/07/2013               Patient Name:  [name] MRN: [id number]  DOB: 11-Dec-1931 Age / Sex: 79 y.o., female   PCP: Linward Headland, MD         Medical Service: Internal Medicine Teaching Service         Attending Physician: Dr. Burns Spain, MD    First Contact: Dr. Delane Ginger Pager: [phone]  Second Contact: Dr. Zada Girt Pager: [phone]       After Hours (After 5p/  First Contact Pager: [phone]  weekends / holidays): Second Contact Pager: [phone]   Chief Complaint: SOB and chest pressure  History of Present Illness: [name] is a 79 year old AA female with a PMH of diastolic CHF, CAD, HTN, status post kidney transplant secondary to focal sclerosing glomerulonephritis in 2003.  [name] presented to the [facility] today accompanied by her daughter for increased SOB and intermittent chest pressure since thanksgiving (9 days).  [name] reports that since thanksgiving [name] has had increased orthopnea, [name] normally uses 2 pillows but lately [name] feels that [name] cannot lay down without becoming SOB.  [name] reports that her chest pressure is intermittent and lasts no longer than 15 minutes.  This pressure typically happens while [name] is sitting still and not associated with exertion.  [name] also admits some feeling of nausea but has not vomited.  [name] denies cough, fever, chills, dysuria, abdominal pain, constipation, diarrhea.   Meds: Current Facility-Administered Medications  Medication Dose Route Frequency Provider Last Rate Last Dose  . [START ON 07/08/2013] 0.9 %  sodium chloride infusion   Intravenous Continuous Shelda Jakes, MD 20 mL/hr at 07/07/13 1745     Current Outpatient Prescriptions  Medication Sig Dispense Refill  . acetaminophen (TYLENOL) 325 MG tablet Take 650 mg by mouth every 6 (six) hours as needed. For pain      . allopurinol (ZYLOPRIM) 100 MG tablet Take 100 mg by mouth at bedtime.      Marland Kitchen amLODipine (NORVASC) 10 MG tablet Take 10 mg by mouth Daily.      Marland Kitchen aspirin EC 81 MG  tablet Take 81 mg by mouth daily.      . cinacalcet (SENSIPAR) 30 MG tablet Take 30 mg by mouth every other day.       . colchicine 0.6 MG tablet Take 1 tablet (0.6 mg total) by mouth daily.  7 tablet  0  . docusate sodium (COLACE) 100 MG capsule Take 100 mg by mouth 2 (two) times daily as needed. For constipation      . famotidine (PEPCID) 20 MG tablet Take 20 mg by mouth 2 (two) times daily.      . furosemide (LASIX) 80 MG tablet Take 80 mg by mouth 2 (two) times daily.       Marland Kitchen HYDROcodone-acetaminophen (NORCO/VICODIN) 5-325 MG per tablet 1-2 tablets po q 6 hours prn moderate to severe pain  20 tablet  0  . losartan (COZAAR) 100 MG tablet Take 100 mg by mouth daily.      . metoprolol (LOPRESSOR) 50 MG tablet Take 100 mg by mouth 2 (two) times daily.      . mycophenolate (CELLCEPT) 250 MG capsule Take 500 mg by mouth 2 (two) times daily.      . simvastatin (ZOCOR) 20 MG tablet Take 20 mg by mouth at bedtime.       . tacrolimus (PROGRAF) 1 MG capsule Take 2 mg  by mouth 2 (two) times daily.         Allergies: Allergies as of 07/07/2013 - Review Complete 07/07/2013  Allergen Reaction Noted  . Sulfa drugs cross reactors Swelling 12/03/2011   Past Medical History  Diagnosis Date  . S/P kidney transplant Dec 23, 2001    due to FSGN, follows with Dr. Briant Cedar  . Coronary artery disease   . Hypertension   . Cholelithiasis   . Diastolic CHF   . Gout     unconfirmed by joint aspiration   Past Surgical History  Procedure Laterality Date  . Kidney transplant    . Abdominal hysterectomy     History reviewed. No pertinent family history. History   Social History  . Marital Status: Divorced    Spouse Name: N/A    Number of Children: N/A  . Years of Education: N/A   Occupational History  . Not on file.   Social History Main Topics  . Smoking status: Former Smoker    Quit date: 04/19/1969  . Smokeless tobacco: Never Used  . Alcohol Use: No  . Drug Use: No  . Sexual Activity: Not on file    Other Topics Concern  . Not on file   Social History Narrative   Patient's husband died in 12-23-08.    Review of Systems: Review of Systems  Constitutional: Negative for fever, chills, weight loss and malaise/fatigue.  HENT: Negative for congestion.   Eyes: Negative for blurred vision.  Respiratory: Positive for shortness of breath. Negative for cough and sputum production.   Cardiovascular: Positive for orthopnea and PND. Negative for chest pain (+chest pressure), palpitations and leg swelling.  Gastrointestinal: Positive for nausea. Negative for heartburn, vomiting, abdominal pain, diarrhea and constipation.  Genitourinary: Negative for dysuria, urgency and frequency.  Musculoskeletal: Negative for falls.  Skin: Negative for rash.  Neurological: Negative for dizziness, weakness and headaches.     Physical Exam: Blood pressure 137/46, pulse 61, temperature 98.8 F (37.1 C), temperature source Oral, resp. rate 22, height 5\' 3"  (1.6 m), weight 165 lb (74.844 kg), SpO2 96.00%. on RA Physical Exam  Nursing note and vitals reviewed. Constitutional: [name] is oriented to person, place, and time. No distress.  HENT:  Head: Normocephalic and atraumatic.  Eyes: Conjunctivae and EOM are normal.  Cardiovascular: Normal rate, regular rhythm, normal heart sounds and intact distal pulses.   No murmur heard. Pulmonary/Chest: Effort normal. No respiratory distress. [name] has no wheezes. [name] has no rales.  Shallow breathing Decreased bibasilar breath sounds  Abdominal: Soft. Bowel sounds are normal. [name] exhibits no distension. There is no tenderness. There is no rebound and no guarding.  Musculoskeletal: [name] exhibits no edema.  Neurological: [name] is alert and oriented to person, place, and time.  Skin: Skin is warm and dry. [name] is not diaphoretic.  Psychiatric: Mood, memory, affect and judgment normal.    Wt Readings from Last 5 Encounters:  07/07/13 165 lb (74.844 kg)  07/05/12 165 lb 9.6 oz  (75.116 kg)  06/29/12 160 lb (72.576 kg)  05/03/12 160 lb 8 oz (72.802 kg)  04/26/12 163 lb (73.936 kg)    Lab results: Basic Metabolic Panel:  Recent Labs  40/98/11 1658  NA 138  K 4.5  CL 107  CO2 19  GLUCOSE 111*  BUN 21  CREATININE 1.12*  CALCIUM 10.1  AG: 12 Liver Function Tests:  Recent Labs  07/07/13 1658  AST 15  ALT 6  ALKPHOS 132*  BILITOT 0.5  PROT 7.0  ALBUMIN  3.7   CBC:  Recent Labs  07/07/13 1658  WBC 7.3  HGB 13.1  HCT 39.8  MCV 90.0  PLT 221   Cardiac Enzymes: IStat Trop: 0.04 BNP:  Recent Labs  07/07/13 1658  PROBNP 2132.0*   Urinalysis:  Recent Labs  07/07/13 1840  COLORURINE YELLOW  LABSPEC 1.008  PHURINE 5.5  GLUCOSEU NEGATIVE  HGBUR NEGATIVE  BILIRUBINUR NEGATIVE  KETONESUR NEGATIVE  PROTEINUR NEGATIVE  UROBILINOGEN 0.2  NITRITE NEGATIVE  LEUKOCYTESUR SMALL*     Imaging results:  Dg Chest 2 View  07/07/2013   CLINICAL DATA:  Shortness of Breath.  EXAM: CHEST  2 VIEW  COMPARISON:  04/25/2012.  FINDINGS: Increased bibasilar airspace opacity and pleural fluid with obscuration of the heart borders. The pulmonary vasculature remains mildly prominent. Diffuse osteopenia and mild scoliosis.  IMPRESSION: 1. Increased bibasilar atelectasis and pleural fluid. 2. Stable mild pulmonary vascular congestion.   Electronically Signed   By: Gordan Payment [name]   On: 07/07/2013 18:17   Ct Chest Wo Contrast  07/07/2013   CLINICAL DATA:  Shortness of breath. Cough and chest congestion. Chest pain.  EXAM: CT CHEST WITHOUT CONTRAST  TECHNIQUE: Multidetector CT imaging of the chest was performed following the standard protocol without IV contrast.  COMPARISON:  Chest radiographs obtained earlier today and chest CT dated 04/21/2012.  FINDINGS: Small to moderate-sized bilateral pleural effusions. Bilateral dense lower lobe atelectasis, greater on the right. There is also atelectasis in the right middle lobe, lingula and inferior right upper lobe.  No patchy airspace opacity suspicious for pneumonia is visualized.  The 5 mm nodule previously demonstrated on the minor fissure is no longer demonstrated. Currently, there is a vague area of ill-defined increased density at that location measuring 5 mm in maximum diameter on image number 30. This has an appearance compatible with a small plaque-like area of pleural thickening on sagittal image number 23. No discrete nodules are seen elsewhere in either lung at this time. No enlarged lymph nodes.  Dense coronary artery calcifications are again demonstrated. The heart is mildly enlarged. Previously described calcifications in the superior vena cava. The inferior left lobe of the thyroid gland remains enlarged, without a discrete mass visualized. Coarse, benign appearing left breast calcifications are noted. Thoracic spine degenerative changes. The included upper abdomen is unremarkable.  IMPRESSION: 1. Small to moderate-sized bilateral pleural effusions. 2. Bilateral lower lobe, right middle lobe, lingular and inferior right upper lobe atelectasis. 3. Dense atheromatous coronary artery calcifications. 4. Previously noted calcification in the superior vena cava. 5. Stable thyroid goiter. 6. No discrete lung nodules seen today.   Electronically Signed   By: Gordan Payment [name]   On: 07/07/2013 19:50    Other results: EKG: Sinus rhythm, rate 52, normal axis, no ST or T wave changes, unchanged from previous EKG 04/24/12  Assessment & Plan by Problem: Ms. Truax is a 79 year old female with a PMH of renal transplantation, diastolic CHF, CAD, and HTN who was admitted for dyspnea with bilateral pleural effusions on CT.   Acute Respiratory Failure - Patient presented with increased SOB and dyspnea.  [name] reports compliance with her home lasix regimen. -ProBNP elevated to 2132 - Weight unchanged from last visit (December 2013) - B/L pleural effusions seen on CT Causes of acute dyspnea considered: Acute MI (no EKG  changes initial troponin neg), Heart failure, Cardiac Tamponade, Asthma/COPD (no history, no wheezing), PE (CT neg), PTX (CT neg, b/l breath sounds), Infection (no fever, leukocytosis, cough,  or focal consolidation on CXR or CT), Upper airway obstruction (no evidence on CT), Malignancy (patient with history of pulmonary nodule). - Given Lasix 80mg  IV in ED. (home dose 80mg  PO BID) - Monitor I&O -Daily weights - Obtain Echo - CE x3, AM EKG to r/o ACS -Patient's last hospitalization last year with similar complaints was concerning for HCAP, [name] is immunosuppressed due to renal transplant, [name] has no leukocytosis, fever, chills, cough, or focal consolidation concerning for pneumonia however if patient's condition deteriorates will obtain blood cultures and start empiric antibiotics. -If patient fails to improve with diuresis consider diagnostic thoracentesis.    Diastolic CHF - Last echo 04/20/12 with EF 70% with diastolic dysfunction. - Continue Metoprolol, Simvastatin, losartan, ASA    CAD (coronary artery disease) - R/O ACS - ASA -Simvastatin    Hypertension - Continue losartan, metoprolol    Gout -Continue allopurinol   DVT PPx: Heparin Diet: regular Code Status: Full Dispo: Disposition is deferred at this time, awaiting improvement of current medical problems. Anticipated discharge in approximately 2-3 day(s).   The patient does have a current PCP Linward Headland, MD) and does need an [facility] hospital follow-up appointment after discharge.  The patient does not have transportation limitations that hinder transportation to clinic appointments.  Signed: Carlynn Purl, DO 07/07/2013, 9:36 PM

## 2013-07-07 NOTE — ED Notes (Signed)
Dr. Zackowski at bedside  

## 2013-07-07 NOTE — ED Notes (Signed)
Admitting at bedside 

## 2013-07-07 NOTE — ED Notes (Signed)
Pt returned from xray

## 2013-07-08 DIAGNOSIS — R0602 Shortness of breath: Secondary | ICD-10-CM | POA: Diagnosis not present

## 2013-07-08 DIAGNOSIS — J90 Pleural effusion, not elsewhere classified: Secondary | ICD-10-CM | POA: Diagnosis not present

## 2013-07-08 DIAGNOSIS — J9601 Acute respiratory failure with hypoxia: Secondary | ICD-10-CM

## 2013-07-08 DIAGNOSIS — J9600 Acute respiratory failure, unspecified whether with hypoxia or hypercapnia: Secondary | ICD-10-CM

## 2013-07-08 LAB — BASIC METABOLIC PANEL
BUN: 22 mg/dL (ref 6–23)
Calcium: 9.9 mg/dL (ref 8.4–10.5)
Chloride: 105 mEq/L (ref 96–112)
GFR calc Af Amer: 46 mL/min — ABNORMAL LOW (ref >90)
GFR calc non Af Amer: 40 mL/min — ABNORMAL LOW (ref >90)
Potassium: 4.1 mEq/L (ref 3.5–5.1)

## 2013-07-08 LAB — CBC
Hemoglobin: 12.2 g/dL (ref 12.0–15.0)
MCH: 29.6 pg (ref 26.0–34.0)
MCHC: 32.3 g/dL (ref 30.0–36.0)
MCV: 91.7 fL (ref 78.0–100.0)
Platelets: 182 10*3/uL (ref 150–400)
RDW: 13.9 % (ref 11.5–15.5)

## 2013-07-08 LAB — TROPONIN I
Troponin I: <0.3 ng/mL (ref <0.30)
Troponin I: <0.3 ng/mL (ref <0.30)

## 2013-07-08 NOTE — Progress Notes (Addendum)
Subjective:  Pt reports increased SOB with minimal exertion.  However, this AM she is currently on 2L O2 via [place] and states the O2 is helping her breathing.  She denies any fever/chills, cough, or N/V/D/C.   She has not had a colonoscopy or mammogram in years.  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     Objective: Vital signs in last 24 hours: Filed Vitals:   07/07/13 2115 07/07/13 2310 07/08/13 0606 07/08/13 0926  BP: 137/46 122/59 124/76 116/72  Pulse: 61 60 56   Temp:  97.3 F (36.3 C) 98 F (36.7 C)   TempSrc:  Oral Oral   Resp: 22 17 16    Height:  5\' 3"  (1.6 m)    Weight:  73.982 kg (163 lb 1.6 oz) 73.619 kg (162 lb 4.8 oz)   SpO2: 96% 99% 98%    Weight change:   Intake/Output Summary (Last 24 hours) at 07/08/13 1129 Last data filed at 07/08/13 0900  Gross per 24 hour  Intake    460 ml  Output   1650 ml  Net  -1190 ml   Constitutional: Vital signs reviewed.  Patient is an elderly female lying in bed with O2 via [place] @ 2L.  She is cooperative with exam and appears in mild distress. Head: Normocephalic and atraumatic Eyes: PERRL, EOMI, conjunctivae normal, No scleral icterus.  Neck: Supple, Trachea midline .  Cardiovascular: RRR, S1 normal, S2 normal, no MRG, pulses symmetric and intact bilaterally Pulmonary/Chest: tachypnea, diffuse decreased breath sounds, no obvious adventitious sounds heard Abdominal: Soft. Non-tender, non-distended, bowel sounds are normal, no masses, organomegaly, or guarding present.  Musculoskeletal: No joint deformities noted Neurological: A&O x3, cranial nerve II-XII are grossly intact, moving all extremities without difficulty  Skin: Warm, dry and intact. No rash, cyanosis, or clubbing.  Psychiatric: Normal mood and affect.    Lab Results: Basic Metabolic Panel:  Recent Labs Lab 07/07/13 1658 07/08/13 0524  NA 138 139  K 4.5 4.1  CL 107 105  CO2 19 21  GLUCOSE 111* 92  BUN 21 22  CREATININE 1.12*  1.25*  CALCIUM 10.1 9.9   Liver Function Tests:  Recent Labs Lab 07/07/13 1658  AST 15  ALT 6  ALKPHOS 132*  BILITOT 0.5  PROT 7.0  ALBUMIN 3.7   No results found for this basename: LIPASE, AMYLASE,  in the last 168 hours No results found for this basename: AMMONIA,  in the last 168 hours CBC:  Recent Labs Lab 07/07/13 1658 07/08/13 0524  WBC 7.3 6.3  HGB 13.1 12.2  HCT 39.8 37.8  MCV 90.0 91.7  PLT 221 182   Cardiac Enzymes:  Recent Labs Lab 07/08/13 0030 07/08/13 0524  TROPONINI <0.30 <0.30   BNP:  Recent Labs Lab 07/07/13 1658  PROBNP 2132.0*   Urinalysis:  Recent Labs Lab 07/07/13 1840  COLORURINE YELLOW  LABSPEC 1.008  PHURINE 5.5  GLUCOSEU NEGATIVE  HGBUR NEGATIVE  BILIRUBINUR NEGATIVE  KETONESUR NEGATIVE  PROTEINUR NEGATIVE  UROBILINOGEN 0.2  NITRITE NEGATIVE  LEUKOCYTESUR SMALL*   Micro Results: No results found for this or any previous visit (from the past 240 hour(s)). Studies/Results: Dg Chest 2 View  07/07/2013   CLINICAL DATA:  Shortness of Breath.  EXAM: CHEST  2 VIEW  COMPARISON:  04/25/2012.  FINDINGS: Increased bibasilar airspace opacity and pleural fluid with obscuration of the heart borders. The pulmonary vasculature remains mildly prominent. Diffuse osteopenia and mild scoliosis.  IMPRESSION: 1. Increased bibasilar atelectasis and pleural fluid. 2. Stable mild pulmonary vascular congestion.   Electronically Signed   By: Gordan Payment [name]   On: 07/07/2013 18:17   Ct Chest Wo Contrast  07/07/2013   CLINICAL DATA:  Shortness of breath. Cough and chest congestion. Chest pain.  EXAM: CT CHEST WITHOUT CONTRAST  TECHNIQUE: Multidetector CT imaging of the chest was performed following the standard protocol without IV contrast.  COMPARISON:  Chest radiographs obtained earlier today and chest CT dated 04/21/2012.  FINDINGS: Small to moderate-sized bilateral pleural effusions. Bilateral dense lower lobe atelectasis, greater on the right.  There is also atelectasis in the right middle lobe, lingula and inferior right upper lobe. No patchy airspace opacity suspicious for pneumonia is visualized.  The 5 mm nodule previously demonstrated on the minor fissure is no longer demonstrated. Currently, there is a vague area of ill-defined increased density at that location measuring 5 mm in maximum diameter on image number 30. This has an appearance compatible with a small plaque-like area of pleural thickening on sagittal image number 23. No discrete nodules are seen elsewhere in either lung at this time. No enlarged lymph nodes.  Dense coronary artery calcifications are again demonstrated. The heart is mildly enlarged. Previously described calcifications in the superior vena cava. The inferior left lobe of the thyroid gland remains enlarged, without a discrete mass visualized. Coarse, benign appearing left breast calcifications are noted. Thoracic spine degenerative changes. The included upper abdomen is unremarkable.  IMPRESSION: 1. Small to moderate-sized bilateral pleural effusions. 2. Bilateral lower lobe, right middle lobe, lingular and inferior right upper lobe atelectasis. 3. Dense atheromatous coronary artery calcifications. 4. Previously noted calcification in the superior vena cava. 5. Stable thyroid goiter. 6. No discrete lung nodules seen today.   Electronically Signed   By: Gordan Payment [name]   On: 07/07/2013 19:50   Medications: I have reviewed the patient's current medications. Scheduled Meds: . allopurinol  100 mg Oral QHS  . amLODipine  10 mg Oral Daily  . aspirin EC  81 mg Oral Daily  . cinacalcet  30 mg Oral QODAY  . famotidine  20 mg Oral BID  . furosemide  80 mg Oral BID  . heparin  5,000 Units Subcutaneous Q8H  . losartan  100 mg Oral Daily  . metoprolol  100 mg Oral BID  . mycophenolate  500 mg Oral BID  . simvastatin  20 mg Oral QHS  . sodium chloride  3 mL Intravenous Q12H  . tacrolimus  2 mg Oral BID   Continuous  Infusions:  PRN Meds:.acetaminophen, morphine injection, ondansetron (ZOFRAN) IV, ondansetron Assessment/Plan: Principal Problem:   Acute respiratory failure with hypoxia Active Problems:   Diastolic CHF   CAD (coronary artery disease)   Hypertension   Gout   Dyspnea   Pleural effusion, bilateral  Acute Respiratory Failure  Patient presented with increased SOB and dyspnea. She reports compliance with her home lasix regimen. ProBNP elevated to 2132.  Weight unchanged from last visit in (December 2013).  Bilateral pleural effusions seen on CT.  Etiology of acute dyspnea considered: Acute MI (no EKG changes troponin x 3 negative), acute heart failure, cardiac tamponade, Asthma/COPD (no h/o, no wheezing), PE (CT neg), PTX (CT neg, b/l breath sounds), infection (no fever, leukocytosis, cough, or focal consolidation on CXR or CT), upper airway obstruction (no UA adventitious sounds, no evidence on CT), malignancy (pt with h/o pulmonary nodule). No h/o autoimmune disease.  No environmental or occupational  exposures noted-pt worked as a Surveyor, mining and in a mail room.  Medications are a possible etiology as mycophenolate is known to be associated with dyspnea and pleural effusion.  She was started on lasix 80mg  IV in ED. (home dose 80mg  PO BID).  We are monitoring her I&O and checking daily weights.  EKG is without acute changes.    -Continue daily weights  -Obtain TTE -PCCM consulted for diagnostic and therapeutic thoracentesis   Diastolic heart failure- proBNP decreased from previous value in 2013.  2455 to 2132.  Previous TTE on 04/20/12 with LVEF 70% with diastolic dysfunction (grade ?).  On exam, decreased breath sounds without obvious crackles or wheezing.  Pt without pedal edema.  She appears euvolemic on exam.   - Continue Metoprolol, Simvastatin, losartan, ASA   CAD (coronary artery disease) -troponin x 3 negative; EKG w/o acute changes  - Continue home meds  Hypertension  -  Continue losartan, metoprolol   s/p kidney transplant -pt had kidney transplant in 2003 for FSGN.  Followed by Dr. Briant Cedar.  Pt on FK506.  Creatinine 1.25 today which is slightly better than her baseline.  -BMET in AM  h/o Gout   -Continue allopurinol   Dispo: Disposition is deferred at this time, awaiting improvement of current medical problems.  Anticipated discharge in approximately 1-2 day(s).   The patient does have a current PCP Linward Headland, MD) and does need an [facility] hospital follow-up appointment after discharge.    .Services Needed at time of discharge: Y = Yes, Blank = No PT:   OT:   RN:   Equipment:   Other:     LOS: 1 day   Boykin Peek, MD 07/08/2013, 11:29 AM

## 2013-07-08 NOTE — Progress Notes (Signed)
Pt admitted to 4E29 from ER, came by stretcher, AO x 4, VSS, denies any pain or discomfort at this time. Family member at the bedside. Pt and family member oriented to the unit and to her room and encouraged to call for assistance to get OOB to prevent any fall or injury while in the hospital. HF education started with the pt and family member. We'll continue with POC.

## 2013-07-08 NOTE — H&P (Signed)
  Date: 07/08/2013  Patient name: [name]  Medical record number: [id number]  Date of birth: 10-Mar-1932   I have seen and evaluated [name] and discussed their care with the Residency Team. [name] was admitted for dyspnea, orthopnea, and chest pressure. Prior to being ill, she was very functional and had no limitations in her activities.   Assessment and Plan: I have seen and evaluated the patient as outlined above. I agree with the formulated Assessment and Plan as detailed in the residents' admission note, with the following changes:   1. Acute resp failure - The most likely cause is lung compression 2/2 B pleural effusions. She had no sig pul edema nor infiltrate on imaging as the explanation for her dyspnea. She has no wheezing on exam as the explanation. Cardiac W/U so far is negative. Her kidneys are functioning so doubt renal failure (even though creatinine is up a bit but is baseline) is resulting in volume overload. The effusions were present last yr but have progressed. She has never had a thoracentesis so will consult PCCM. She will also have a repeat ECHO and received IV Lasix.   Joy Spain, MD 12/7/201410:53 AM

## 2013-07-08 NOTE — Consult Note (Signed)
Dictation #:  [id number]

## 2013-07-09 ENCOUNTER — Inpatient Hospital Stay (HOSPITAL_COMMUNITY): Payer: Medicare Other

## 2013-07-09 DIAGNOSIS — I5030 Unspecified diastolic (congestive) heart failure: Secondary | ICD-10-CM | POA: Diagnosis not present

## 2013-07-09 DIAGNOSIS — J90 Pleural effusion, not elsewhere classified: Secondary | ICD-10-CM | POA: Diagnosis not present

## 2013-07-09 DIAGNOSIS — J9819 Other pulmonary collapse: Secondary | ICD-10-CM | POA: Diagnosis not present

## 2013-07-09 DIAGNOSIS — I517 Cardiomegaly: Secondary | ICD-10-CM

## 2013-07-09 DIAGNOSIS — J9600 Acute respiratory failure, unspecified whether with hypoxia or hypercapnia: Secondary | ICD-10-CM | POA: Diagnosis not present

## 2013-07-09 DIAGNOSIS — I509 Heart failure, unspecified: Secondary | ICD-10-CM | POA: Diagnosis not present

## 2013-07-09 DIAGNOSIS — R0602 Shortness of breath: Secondary | ICD-10-CM | POA: Diagnosis not present

## 2013-07-09 LAB — COMPREHENSIVE METABOLIC PANEL
ALT: 6 U/L (ref 0–35)
Albumin: 3.6 g/dL (ref 3.5–5.2)
BUN: 30 mg/dL — ABNORMAL HIGH (ref 6–23)
CO2: 24 mEq/L (ref 19–32)
Calcium: 9.7 mg/dL (ref 8.4–10.5)
Creatinine, Ser: 1.62 mg/dL — ABNORMAL HIGH (ref 0.50–1.10)
Potassium: 5.1 mEq/L (ref 3.5–5.1)
Total Protein: 6.8 g/dL (ref 6.0–8.3)

## 2013-07-09 LAB — PROTIME-INR
INR: 1.02 (ref 0.00–1.49)
Prothrombin Time: 13.2 seconds (ref 11.6–15.2)

## 2013-07-09 LAB — APTT: aPTT: 32 seconds (ref 24–37)

## 2013-07-09 MED ORDER — LIDOCAINE HCL 2 % EX GEL
Freq: Once | CUTANEOUS | Status: AC
  Administered 2013-07-09: 5 via TOPICAL
  Filled 2013-07-09: qty 5

## 2013-07-09 MED ORDER — FUROSEMIDE 80 MG PO TABS
80.0000 mg | ORAL_TABLET | Freq: Every day | ORAL | Status: DC
  Administered 2013-07-10: 06:00:00 80 mg via ORAL
  Filled 2013-07-09 (×2): qty 1

## 2013-07-09 NOTE — Progress Notes (Signed)
BP 131/46 and HR 52. Notifies MD. Rip Harbour to hold metoprolol. Patient is asymptomatic. No verbal complaints. Will continue to monitor patient for further changes in condition.

## 2013-07-09 NOTE — Consult Note (Signed)
NAMEPATRIZIA, [name]                  ACCOUNT NO.:  [id number]  MEDICAL RECORD NO.:  [id number]  LOCATION:  [facility]                         FACILITY:  [facility]  PHYSICIAN:  Barbaraann Share, MD,FCCPDATE OF BIRTH:  1931/10/10  DATE OF CONSULTATION:  07/08/2013 DATE OF DISCHARGE:                                CONSULTATION   REFERRING PHYSICIAN:  Blanch Media, [name], Internal Medicine Teaching Service.  HISTORY OF PRESENT ILLNESS:  The patient is a very pleasant 79 year old female who I have been asked to see for worsening dyspnea on exertion and bilateral pleural effusions.  The patient has a known history of coronary artery disease, as well as status post kidney transplant, and comes in with worsening shortness of breath since Thanksgiving.  She also describes classic orthopnea, and most recently has been having chest pressure which is intermittent in nature.  On admission, she was found to have bilateral pleural effusions, and underwent a CT scan of her chest.  This showed bilateral pleural effusions, right much greater than left, with associative atelectasis.  On the right, there was fluid going into the fissure as well.  There was no definite infiltrate or masses noted.  She was found to have dense coronary artery calcifications.  The patient denies any chest congestion, cough, or purulence.  She has had no fevers, chills, or sweats.  She states that "I feel fine, except for shortness of breath."  She has not had any difficulty with swallowing or aspiration events.  She has been afebrile during her hospitalization.  PAST MEDICAL HISTORY:  Significant for: 1. Kidney transplant. 2. History of coronary artery disease. 3. History of hypertension. 4. History of diastolic heart failure. 5. History of cholelithiasis. 6. History of gout.  The patient is allergic to sulfa.  SOCIAL HISTORY:  She is currently divorced.  She has not smoked since 1970 and denies alcohol or drug  use.  FAMILY HISTORY:  Noncontributory.  REVIEW OF SYSTEMS:  A 10-point review of systems was unremarkable except for that listed in the history of present illness.  PHYSICAL EXAMINATION:  GENERAL:  She is a well-developed female, in no acute distress. VITAL SIGNS:  Blood pressure is 116/72, pulse is 56, respiratory rate 16.  She is afebrile.  Her oxygen saturation is 98% on 2 L. HEENT:  Pupils equal, round, and reactive to light and accommodation. Extraocular muscles are intact.  Nares are patent without discharge. Oropharynx is clear with poor dentition. NECK:  Supple with obvious JVD, but no thyromegaly or lymphadenopathy. CHEST:  Decreased breath sounds and crackles bilaterally, right much worse than left, there were also bronchial breath sounds in the right lower lobe. CARDIAC:  Mildly tachycardic, but regular rhythm with a 2/6 systolic murmur. ABDOMEN:  Soft, nontender, nondistended with good bowel sounds. GENITAL/RECTAL/BREAST:  Not done and not indicated. EXTREMITIES:  Lower extremities are without edema and pulses are intact distally, but decreased. NEUROLOGIC:  She is alert and oriented, and moves all 4 extremities without deficits.  IMPRESSION:  Progressive dyspnea on exertion with classic orthopnea, as well as chest discomfort that is worrisome for angina.  The patient has bilateral pleural effusions on CT scan with secondary  atelectasis.  This is all most consistent with decompensated congestive heart failure, and the question is raised whether she may have significant coronary artery disease that is contributing to all of this.  There is really nothing to suggest pneumonia, with the absence of congestion, cough, mucus, or fever.  The patient states that she otherwise feels well.  Other causes of bilateral effusions to consider, but probably unlikely include autoimmune disease, bilateral pulmonary emboli, or possibly bilateral aspiration pneumonia with secondary  effusions.  At this point, I would suggest a right thoracentesis for both therapeutic and diagnostic purposes, as well as a trial of aggressive diuresis, watching her renal function closely.  I would also recommend Cardiology involvement to consider progressive coronary artery disease.  SUGGESTIONS: 1. Right thoracentesis under ultrasound guidance.  Please drain as dry     as possible. 2. Diuresis. 3. Would consider cardiac evaluation for progressive coronary artery     disease.     Barbaraann Share, MD,FCCP     [facility]  D:  07/08/2013  T:  07/09/2013  Job:  [id number]

## 2013-07-09 NOTE — Progress Notes (Signed)
RT performed pulmonary toilet with patient with IS, with cough and deep breathing. Patient tolerated procedure well.

## 2013-07-09 NOTE — Progress Notes (Signed)
RT notified RN that patient almost "passed out" in room. RN to room and patient states she was not dizzy or lightheaded, that she did not feel like she was going to pass out. Patient states she got short of breath fixing bed sheet. Patient educated to call RN for assistance when patient needs to get OOB. Pt 99% on 2L [place], BP 122/42. Bed alarm placed on patient. Will continue to monitor. Baron Hamper, RN

## 2013-07-09 NOTE — Progress Notes (Signed)
  Echocardiogram 2D Echocardiogram has been performed.  [name] 07/09/2013, 11:15 AM

## 2013-07-09 NOTE — Progress Notes (Signed)
PULMONARY  / CRITICAL CARE MEDICINE  Name: [name] MRN: [id number] DOB: [birth year]    ADMISSION DATE:  07/07/2013 CONSULTATION DATE:  12/7  REFERRING MD :  Dr. Rogelia Boga PRIMARY SERVICE:  [facility]  CHIEF COMPLAINT:  Dyspnea  BRIEF PATIENT DESCRIPTION: 79 y/o F admitted with progressive dyspnea. Found to have bilateral pleural effusions.    SIGNIFICANT EVENTS / STUDIES:  12/06 - Admit with progressive dyspnea.  CT Chest>>>mod bilateral effusions, RL atx, dense CAD 12/07 - PCCM consulted for bilateral pleural effusions 12/08 - ECHO>>EF 60-65%, mod LVH, Grade 2 Diastolic dysfunction, PA peak 34   LINES / TUBES:  CULTURES:  ANTIBIOTICS:   SUBJECTIVE: Pt reports noted weight loss since admit, responding to diuresis well.  Concerned about meds affecting her kidney transplant.    VITAL SIGNS: Temp:  [97.7 F (36.5 C)-98 F (36.7 C)] 97.7 F (36.5 C) (12/08 0529) Pulse Rate:  [52-64] 52 (12/08 0945) Resp:  [18-19] 19 (12/08 0529) BP: (131-133)/(46-55) 131/46 mmHg (12/08 0945) SpO2:  [96 %-97 %] 97 % (12/08 0529) Weight:  [156 lb (70.761 kg)] 156 lb (70.761 kg) (12/08 0529)  PHYSICAL EXAMINATION: General:  wdwn elderly female in NAD Neuro:  AAOx4, speech clear, Greg HEENT:  Mm pink/moist, no jvd Cardiovascular:  s1s2 rrr, no m/r/g Lungs:  resp's even/non-labored, diminished on R, left clear Abdomen:  Round/soft, bsx4 active Musculoskeletal:  No acute deformities Skin:  Warm/dry, no sig edema   Recent Labs Lab 07/07/13 1658 07/08/13 0524 07/09/13 0533  NA 138 139 139  K 4.5 4.1 5.1  CL 107 105 105  CO2 19 21 24   BUN 21 22 30*  CREATININE 1.12* 1.25* 1.62*  GLUCOSE 111* 92 99    Recent Labs Lab 07/07/13 1658 07/08/13 0524  HGB 13.1 12.2  HCT 39.8 37.8  WBC 7.3 6.3  PLT 221 182   Dg Chest 2 View  07/07/2013   CLINICAL DATA:  Shortness of Breath.  EXAM: CHEST  2 VIEW  COMPARISON:  04/25/2012.  FINDINGS: Increased bibasilar airspace opacity and pleural fluid  with obscuration of the heart borders. The pulmonary vasculature remains mildly prominent. Diffuse osteopenia and mild scoliosis.  IMPRESSION: 1. Increased bibasilar atelectasis and pleural fluid. 2. Stable mild pulmonary vascular congestion.   Electronically Signed   By: Gordan Payment [name]   On: 07/07/2013 18:17   Ct Chest Wo Contrast  07/07/2013   CLINICAL DATA:  Shortness of breath. Cough and chest congestion. Chest pain.  EXAM: CT CHEST WITHOUT CONTRAST  TECHNIQUE: Multidetector CT imaging of the chest was performed following the standard protocol without IV contrast.  COMPARISON:  Chest radiographs obtained earlier today and chest CT dated 04/21/2012.  FINDINGS: Small to moderate-sized bilateral pleural effusions. Bilateral dense lower lobe atelectasis, greater on the right. There is also atelectasis in the right middle lobe, lingula and inferior right upper lobe. No patchy airspace opacity suspicious for pneumonia is visualized.  The 5 mm nodule previously demonstrated on the minor fissure is no longer demonstrated. Currently, there is a vague area of ill-defined increased density at that location measuring 5 mm in maximum diameter on image number 30. This has an appearance compatible with a small plaque-like area of pleural thickening on sagittal image number 23. No discrete nodules are seen elsewhere in either lung at this time. No enlarged lymph nodes.  Dense coronary artery calcifications are again demonstrated. The heart is mildly enlarged. Previously described calcifications in the superior vena cava. The inferior left lobe of  the thyroid gland remains enlarged, without a discrete mass visualized. Coarse, benign appearing left breast calcifications are noted. Thoracic spine degenerative changes. The included upper abdomen is unremarkable.  IMPRESSION: 1. Small to moderate-sized bilateral pleural effusions. 2. Bilateral lower lobe, right middle lobe, lingular and inferior right upper lobe atelectasis. 3.  Dense atheromatous coronary artery calcifications. 4. Previously noted calcification in the superior vena cava. 5. Stable thyroid goiter. 6. No discrete lung nodules seen today.   Electronically Signed   By: Gordan Payment [name]   On: 07/07/2013 19:50    ASSESSMENT / PLAN:  Progressive Dyspnea Bilateral Pleural Effusions Decompensated Diastolic CHF CAD - noted on CTA, in coronary arteries and SVC  79 y/o F with CAD, DCHF admitted with progressive dyspnea.  Found to have small to moderate bilateral pleural effusions (R>L).  Has responded well to gentle diuresis.  Negative balance and weight change from 162 to 156.    Plan: -continue gentle diuresis -f/u cxr post diuresis with improvement in effusion -oxygen to keep saturations > 92% -trend CXR's  -pulmonary hygiene   Canary Brim, NP-C [name] [name]: [phone] or [phone]  Independently examined pt, evaluated data & formulated above care plan with NP who scribed this note & edited by me.  ALVA,RAKESH V.   07/09/2013, 1:22 PM

## 2013-07-09 NOTE — Progress Notes (Signed)
Pt resting on bed comfortable, denies any pain or SOB at this time. Pt on 2L O2 for comfort only.

## 2013-07-09 NOTE — Progress Notes (Addendum)
  Date: 07/09/2013  Patient name: KATISHA SHIMIZU  Medical record number: [id number]  Date of birth: 17-Jul-1932   This patient has been seen and the plan of care was discussed with the house staff. Please see their note for complete details. I concur with their findings with the following additions/corrections: Ms Pounds states her breathing is a bit better but still dyspneic with little movement. On further questioning, she cannot ascend a flight of stairs 2/2 dyspnea and fatigue. But otherwise is very active. Her Trop were negative. Does have CAD with stent in past. On statin, ASA, BB. Her ECHO showed diastolic dysfxn but no wall motion abnl. The questions is whether she needs inpt cards consult. If still symtomatic after thoracentesis, will definitely get cards consult.   For her pleural effusion, she has maximally diuresed - now bump in creatinine. CXR shows slight decrease in effusion (offical reading pending) on R but she is still symptomatic. She has better airflow today but now with also crackles R base. Check with PCCM regarding thoracentesis.     Burns Spain, MD 07/09/2013, 1:05 PM

## 2013-07-09 NOTE — Progress Notes (Signed)
Subjective:  Pt reports much improvement in her breathing since admission.  She reports breathing comfortably in bed but gets SOB when "stirring".  She denies any cough, CP, fever/chills, N/V.  She is using O2@ 2L [place] when she gets up because she gets so short winded.  Before admission, she was able to walk around without getting breathless.  She reports having episodes of feeling nauseated when going into clothing stores.   She has lost 9 lbs. since admission.   Objective: Vital signs in last 24 hours: Filed Vitals:   07/08/13 1300 07/08/13 2237 07/09/13 0529 07/09/13 0945  BP: 127/48 131/46 133/55 131/46  Pulse: 52 56 64 52  Temp: 97.7 F (36.5 C) 98 F (36.7 C) 97.7 F (36.5 C)   TempSrc: Oral Oral Oral   Resp: 20 18 19    Height:      Weight:   70.761 kg (156 lb)   SpO2: 99% 96% 97%    Weight change: -4.082 kg (-9 lb)  Intake/Output Summary (Last 24 hours) at 07/09/13 1140 Last data filed at 07/09/13 0953  Gross per 24 hour  Intake    463 ml  Output   1450 ml  Net   -987 ml   Constitutional: Vital signs reviewed.  Patient is an elderly female lying in bed in NAD.   Head: Normocephalic and atraumatic Eyes: PERRL, EOMI, conjunctivae normal, No scleral icterus.  Neck: Supple, Trachea midline .  Cardiovascular: RRR, S1 normal, S2 normal, no MRG, pulses symmetric and intact bilaterally Pulmonary/Chest: minimal fine crackles in the R base, otherwise no other adventitious sounds heard  Abdominal: Soft. Non-tender, non-distended, bowel sounds are normal, no masses, organomegaly, or guarding present.  Musculoskeletal: No joint deformities noted Neurological: A&O x3, cranial nerve II-XII are grossly intact, moving all extremities without difficulty  Skin: Warm, dry and intact. No rash, cyanosis, or clubbing.  Psychiatric: Normal mood and affect.    Lab Results: Basic Metabolic Panel:  Recent Labs Lab 07/08/13 0524 07/09/13 0533  NA 139 139  K 4.1 5.1  CL 105 105    CO2 21 24  GLUCOSE 92 99  BUN 22 30*  CREATININE 1.25* 1.62*  CALCIUM 9.9 9.7   Liver Function Tests:  Recent Labs Lab 07/07/13 1658 07/09/13 0533  AST 15 16  ALT 6 6  ALKPHOS 132* 127*  BILITOT 0.5 0.4  PROT 7.0 6.8  ALBUMIN 3.7 3.6   No results found for this basename: LIPASE, AMYLASE,  in the last 168 hours No results found for this basename: AMMONIA,  in the last 168 hours CBC:  Recent Labs Lab 07/07/13 1658 07/08/13 0524  WBC 7.3 6.3  HGB 13.1 12.2  HCT 39.8 37.8  MCV 90.0 91.7  PLT 221 182   Cardiac Enzymes:  Recent Labs Lab 07/08/13 0030 07/08/13 0524 07/08/13 1645  TROPONINI <0.30 <0.30 <0.30   BNP:  Recent Labs Lab 07/07/13 1658  PROBNP 2132.0*   Urinalysis:  Recent Labs Lab 07/07/13 1840  COLORURINE YELLOW  LABSPEC 1.008  PHURINE 5.5  GLUCOSEU NEGATIVE  HGBUR NEGATIVE  BILIRUBINUR NEGATIVE  KETONESUR NEGATIVE  PROTEINUR NEGATIVE  UROBILINOGEN 0.2  NITRITE NEGATIVE  LEUKOCYTESUR SMALL*   Micro Results: No results found for this or any previous visit (from the past 240 hour(s)). Studies/Results: Dg Chest 2 View  07/07/2013   CLINICAL DATA:  Shortness of Breath.  EXAM: CHEST  2 VIEW  COMPARISON:  04/25/2012.  FINDINGS: Increased bibasilar airspace opacity and pleural fluid with  obscuration of the heart borders. The pulmonary vasculature remains mildly prominent. Diffuse osteopenia and mild scoliosis.  IMPRESSION: 1. Increased bibasilar atelectasis and pleural fluid. 2. Stable mild pulmonary vascular congestion.   Electronically Signed   By: Gordan Payment [name]   On: 07/07/2013 18:17   Ct Chest Wo Contrast  07/07/2013   CLINICAL DATA:  Shortness of breath. Cough and chest congestion. Chest pain.  EXAM: CT CHEST WITHOUT CONTRAST  TECHNIQUE: Multidetector CT imaging of the chest was performed following the standard protocol without IV contrast.  COMPARISON:  Chest radiographs obtained earlier today and chest CT dated 04/21/2012.  FINDINGS:  Small to moderate-sized bilateral pleural effusions. Bilateral dense lower lobe atelectasis, greater on the right. There is also atelectasis in the right middle lobe, lingula and inferior right upper lobe. No patchy airspace opacity suspicious for pneumonia is visualized.  The 5 mm nodule previously demonstrated on the minor fissure is no longer demonstrated. Currently, there is a vague area of ill-defined increased density at that location measuring 5 mm in maximum diameter on image number 30. This has an appearance compatible with a small plaque-like area of pleural thickening on sagittal image number 23. No discrete nodules are seen elsewhere in either lung at this time. No enlarged lymph nodes.  Dense coronary artery calcifications are again demonstrated. The heart is mildly enlarged. Previously described calcifications in the superior vena cava. The inferior left lobe of the thyroid gland remains enlarged, without a discrete mass visualized. Coarse, benign appearing left breast calcifications are noted. Thoracic spine degenerative changes. The included upper abdomen is unremarkable.  IMPRESSION: 1. Small to moderate-sized bilateral pleural effusions. 2. Bilateral lower lobe, right middle lobe, lingular and inferior right upper lobe atelectasis. 3. Dense atheromatous coronary artery calcifications. 4. Previously noted calcification in the superior vena cava. 5. Stable thyroid goiter. 6. No discrete lung nodules seen today.   Electronically Signed   By: Gordan Payment [name]   On: 07/07/2013 19:50   Medications: I have reviewed the patient's current medications. Scheduled Meds: . allopurinol  100 mg Oral QHS  . amLODipine  10 mg Oral Daily  . aspirin EC  81 mg Oral Daily  . cinacalcet  30 mg Oral QODAY  . famotidine  20 mg Oral BID  . furosemide  80 mg Oral BID  . heparin  5,000 Units Subcutaneous Q8H  . losartan  100 mg Oral Daily  . metoprolol  100 mg Oral BID  . mycophenolate  500 mg Oral BID  .  simvastatin  20 mg Oral QHS  . sodium chloride  3 mL Intravenous Q12H  . tacrolimus  2 mg Oral BID   Continuous Infusions:  PRN Meds:.acetaminophen, morphine injection, ondansetron (ZOFRAN) IV, ondansetron Assessment/Plan: Principal Problem:   Acute respiratory failure with hypoxia Active Problems:   Diastolic CHF   CAD (coronary artery disease)   Hypertension   Gout   Dyspnea   Pleural effusion, bilateral  Acute Respiratory Failure  Patient presented with increased SOB and dyspnea. She reports compliance with her home lasix regimen. ProBNP elevated to 2132.  Weight unchanged from last visit in (December 2013).  Bilateral pleural effusions seen on CT.  Etiology of acute dyspnea considered: Acute MI (no EKG changes troponin x 3 negative), acute heart failure, cardiac tamponade, Asthma/COPD (no h/o, no wheezing), PE (CT neg), PTX (CT neg, b/l breath sounds), infection (no fever, leukocytosis, cough, or focal consolidation on CXR or CT), upper airway obstruction (no UA adventitious sounds, no evidence  on CT), malignancy (pt with h/o pulmonary nodule). No h/o autoimmune disease.  No environmental or occupational exposures noted-pt worked as a Surveyor, mining and in a mail room.  Medications are a possible etiology as mycophenolate is known to be associated with dyspnea and pleural effusion.  She was started on lasix 80mg  IV in ED. (home dose 80mg  PO BID).  We are monitoring her I&O and checking daily weights.  EKG is without acute changes.  CXR today revealed no change since admission.  07/09/13 TTE showed LVEF 60-65% with moderate LVH.  PA peak 34. Grade 2 diastolic dysfunction.  PCCM recommends continued gentle diuresis with pulmonary toilet and f/u CXR.  IR to evaluate for thoracentesis.     -Continue daily weights  -IR evaluation for thoracentesis diagnostic and therapeutic  Diastolic heart failure- proBNP decreased from previous value in 2013.  2455 to 2132.  Previous TTE on 04/20/12 with  LVEF 70% with diastolic dysfunction (grade ?).  On exam, fine crackles at R base.  Pt without pedal edema.  She appears euvolemic on exam.  However, she has lost 9 lbs. since admission.  See above for TTE.   -d/c IV lasix since creatinine trending up -po lasix 80mg  in AM -Continue Metoprolol, Simvastatin, losartan, ASA   CAD (coronary artery disease) -troponin x 3 negative; EKG w/o acute changes  - Continue home meds  Hypertension  - Continue losartan, metoprolol   s/p kidney transplant -pt had kidney transplant in 2003 for FSGN.  Followed by Dr. Briant Cedar.  Pt on FK506.  Creatinine 1.25 today which is slightly better than her baseline.  -BMET in AM  h/o Gout   -Continue allopurinol   Dispo: Disposition is deferred at this time, awaiting improvement of current medical problems.  Anticipated discharge in approximately 1-2 day(s).   The patient does have a current PCP Linward Headland, MD) and does need an [facility] hospital follow-up appointment after discharge.    .Services Needed at time of discharge: Y = Yes, Blank = No PT:   OT:   RN:   Equipment:   Other:     LOS: 2 days   Boykin Peek, MD 07/09/2013, 11:40 AM

## 2013-07-09 NOTE — Progress Notes (Signed)
Report given to receiving RN. Patient is stable. No verbal complaints and no signs or symptoms of distress or discomfort.  

## 2013-07-10 ENCOUNTER — Inpatient Hospital Stay (HOSPITAL_COMMUNITY): Payer: Medicare Other

## 2013-07-10 DIAGNOSIS — I509 Heart failure, unspecified: Secondary | ICD-10-CM | POA: Diagnosis not present

## 2013-07-10 DIAGNOSIS — I5030 Unspecified diastolic (congestive) heart failure: Secondary | ICD-10-CM

## 2013-07-10 DIAGNOSIS — J9600 Acute respiratory failure, unspecified whether with hypoxia or hypercapnia: Secondary | ICD-10-CM | POA: Diagnosis not present

## 2013-07-10 DIAGNOSIS — I2510 Atherosclerotic heart disease of native coronary artery without angina pectoris: Secondary | ICD-10-CM

## 2013-07-10 DIAGNOSIS — R899 Unspecified abnormal finding in specimens from other organs, systems and tissues: Secondary | ICD-10-CM | POA: Diagnosis not present

## 2013-07-10 DIAGNOSIS — I5033 Acute on chronic diastolic (congestive) heart failure: Principal | ICD-10-CM

## 2013-07-10 DIAGNOSIS — R0602 Shortness of breath: Secondary | ICD-10-CM | POA: Diagnosis not present

## 2013-07-10 DIAGNOSIS — J90 Pleural effusion, not elsewhere classified: Secondary | ICD-10-CM | POA: Diagnosis not present

## 2013-07-10 LAB — BASIC METABOLIC PANEL
BUN: 31 mg/dL — ABNORMAL HIGH (ref 6–23)
Calcium: 9.5 mg/dL (ref 8.4–10.5)
Creatinine, Ser: 1.56 mg/dL — ABNORMAL HIGH (ref 0.50–1.10)
GFR calc Af Amer: 35 mL/min — ABNORMAL LOW (ref >90)
GFR calc non Af Amer: 30 mL/min — ABNORMAL LOW (ref >90)
Potassium: 4.1 mEq/L (ref 3.5–5.1)
Sodium: 139 mEq/L (ref 135–145)

## 2013-07-10 LAB — COMPREHENSIVE METABOLIC PANEL
ALT: 6 U/L (ref 0–35)
AST: 17 U/L (ref 0–37)
Albumin: 3.6 g/dL (ref 3.5–5.2)
Calcium: 9.2 mg/dL (ref 8.4–10.5)
GFR calc Af Amer: 31 mL/min — ABNORMAL LOW (ref >90)
Glucose, Bld: 101 mg/dL — ABNORMAL HIGH (ref 70–99)
Potassium: 4.2 mEq/L (ref 3.5–5.1)
Sodium: 136 mEq/L (ref 135–145)
Total Protein: 6.6 g/dL (ref 6.0–8.3)

## 2013-07-10 LAB — BODY FLUID CELL COUNT WITH DIFFERENTIAL
Eos, Fluid: 1 %
Monocyte-Macrophage-Serous Fluid: 59 % (ref 50–90)
Neutrophil Count, Fluid: 9 % (ref 0–25)
Total Nucleated Cell Count, Fluid: 495 cu mm (ref 0–1000)

## 2013-07-10 LAB — PROTEIN, BODY FLUID: Total protein, fluid: 2.6 g/dL

## 2013-07-10 LAB — LACTATE DEHYDROGENASE: LDH: 197 U/L (ref 94–250)

## 2013-07-10 MED ORDER — FAMOTIDINE 20 MG PO TABS
20.0000 mg | ORAL_TABLET | Freq: Every day | ORAL | Status: DC
  Administered 2013-07-11: 10:00:00 20 mg via ORAL
  Filled 2013-07-10: qty 1

## 2013-07-10 MED ORDER — FUROSEMIDE 80 MG PO TABS
80.0000 mg | ORAL_TABLET | Freq: Two times a day (BID) | ORAL | Status: DC
  Administered 2013-07-10 – 2013-07-11 (×2): 80 mg via ORAL
  Filled 2013-07-10 (×4): qty 1

## 2013-07-10 MED ORDER — LOSARTAN POTASSIUM 50 MG PO TABS
50.0000 mg | ORAL_TABLET | Freq: Every day | ORAL | Status: DC
  Administered 2013-07-11: 50 mg via ORAL
  Filled 2013-07-10: qty 1

## 2013-07-10 MED ORDER — METOPROLOL TARTRATE 50 MG PO TABS
50.0000 mg | ORAL_TABLET | Freq: Two times a day (BID) | ORAL | Status: DC
  Administered 2013-07-11: 50 mg via ORAL
  Filled 2013-07-10 (×2): qty 1

## 2013-07-10 NOTE — Procedures (Signed)
Successful US guided right thoracentesis. Yielded of clear yellow fluid. Pt tolerated procedure well. No immediate complications.  Specimen was sent for labs. CXR ordered.  Brayton El PA-C 07/10/2013 3:07 PM

## 2013-07-10 NOTE — Progress Notes (Signed)
Pt weight up 6 pounds from yesterday. Per chart previous weights this admission: 07/10/13 0607 73.664 kg (162 lb 6.4 oz)  07/09/13 0529 70.761 kg (156 lb)  07/08/13 0606 73.619 kg (162 lb 4.8 oz)  07/07/13 2310 73.982 kg (163 lb 1.6 oz)    Will continue to monitor. Baron Hamper, RN

## 2013-07-10 NOTE — Progress Notes (Signed)
Subjective:  She is feeling better today. No new complaints. She is still short of breath. No overnight events. No fevers.   Objective: Vital signs in last 24 hours: Filed Vitals:   07/10/13 0607 07/10/13 1143 07/10/13 1255 07/10/13 1446  BP:  120/43 113/56 110/40  Pulse:  60 55   Temp:   98 F (36.7 C)   TempSrc:   Oral   Resp:   18   Height:      Weight: 162 lb 6.4 oz (73.664 kg)     SpO2:   98%    Weight change: 7 lb (3.175 kg)  Intake/Output Summary (Last 24 hours) at 07/10/13 1710 Last data filed at 07/10/13 1243  Gross per 24 hour  Intake    580 ml  Output    400 ml  Net    180 ml   Constitutional: Vital signs reviewed.  Patient is an elderly female lying in bed in NAD.   Head: Normocephalic and atraumatic Eyes: PERRL, EOMI, conjunctivae normal, No scleral icterus.  Neck: Supple, Trachea midline .  Cardiovascular: RRR,normal heart sounds, pulses symmetric and intact bilaterally Pulmonary/Chest: improved air movement bilaterally. Abdominal: Soft. Non-tender, non-distended, bowel sounds are normal, no masses, organomegaly, or guarding present.  Musculoskeletal: No joint deformities noted Neurological: A&O x3, moving all extremities without difficulty  Skin: Warm, dry and intact. No rash, cyanosis, or clubbing.  Psychiatric: Normal mood and affect.    Lab Results: Basic Metabolic Panel:  Recent Labs Lab 07/09/13 0533 07/10/13 0633  NA 139 139  K 5.1 4.1  CL 105 103  CO2 24 26  GLUCOSE 99 91  BUN 30* 31*  CREATININE 1.62* 1.56*  CALCIUM 9.7 9.5   Liver Function Tests:  Recent Labs Lab 07/07/13 1658 07/09/13 0533  AST 15 16  ALT 6 6  ALKPHOS 132* 127*  BILITOT 0.5 0.4  PROT 7.0 6.8  ALBUMIN 3.7 3.6   CBC:  Recent Labs Lab 07/07/13 1658 07/08/13 0524  WBC 7.3 6.3  HGB 13.1 12.2  HCT 39.8 37.8  MCV 90.0 91.7  PLT 221 182   Cardiac Enzymes:  Recent Labs Lab 07/08/13 0030 07/08/13 0524 07/08/13 1645  TROPONINI <0.30 <0.30  <0.30   BNP:  Recent Labs Lab 07/07/13 1658  PROBNP 2132.0*   Urinalysis:  Recent Labs Lab 07/07/13 1840  COLORURINE YELLOW  LABSPEC 1.008  PHURINE 5.5  GLUCOSEU NEGATIVE  HGBUR NEGATIVE  BILIRUBINUR NEGATIVE  KETONESUR NEGATIVE  PROTEINUR NEGATIVE  UROBILINOGEN 0.2  NITRITE NEGATIVE  LEUKOCYTESUR SMALL*   Studies/Results: Dg Chest 1 View  07/10/2013   CLINICAL DATA:  Status post right-sided thoracentesis  EXAM: CHEST - 1 VIEW  COMPARISON:  07/09/2013  FINDINGS: Cardiac shadow remains enlarged. There is been interval right-sided thoracentesis with reduction in right-sided pleural effusion. No pneumothorax is noted. A small left-sided pleural effusion is stable. No focal infiltrate is seen.  IMPRESSION: Improvement in right effusion following thoracentesis. No pneumothorax is noted.   Electronically Signed   By: Alcide Clever [name]   On: 07/10/2013 15:23   Dg Chest 2 View  07/09/2013   CLINICAL DATA:  Pleural effusion  EXAM: CHEST  2 VIEW  COMPARISON:  07/07/2013  FINDINGS: Moderately large right pleural effusion and right lower lobe consolidation unchanged. Smaller left effusion and left lower lobe atelectasis also unchanged  Mild cardiac enlargement. Negative for vascular congestion or edema.  IMPRESSION: Bilaterally fusion atelectasis unchanged. Negative for Pulmonary vascular congestion or edema at this time.  Electronically Signed   By: Marlan Palau [name]   On: 07/09/2013 13:26   US Thoracentesis Asp Pleural Space W/img Guide  07/10/2013   CLINICAL DATA:  Dyspnea on exertion, recurrent pleural effusion. Request diagnostic and therapeutic thoracentesis  EXAM: ULTRASOUND GUIDED right THORACENTESIS  COMPARISON:  None.  FINDINGS: A total of approximately 500 mL of clear yellow fluid was removed. A fluid sample wassent for laboratory analysis.  IMPRESSION: Successful ultrasound guided right thoracentesis yielding 500 mL of pleural fluid.  Read by: Brayton El PA-C  PROCEDURE: An  ultrasound guided thoracentesis was thoroughly discussed with the patient and questions answered. The benefits, risks, alternatives and complications were also discussed. The patient understands and wishes to proceed with the procedure. Written consent was obtained.  Ultrasound was performed to localize and mark an adequate pocket of fluid in the right chest. The area was then prepped and draped in the normal sterile fashion. 1% Lidocaine was used for local anesthesia. Under ultrasound guidance a 19 gauge Yueh catheter was introduced. Thoracentesis was performed. The catheter was removed and a dressing applied.  Complications:  None immediate   Electronically Signed   By: Simonne Come [name]   On: 07/10/2013 15:15   Medications: I have reviewed the patient's current medications. Scheduled Meds: . allopurinol  100 mg Oral QHS  . amLODipine  10 mg Oral Daily  . aspirin EC  81 mg Oral Daily  . cinacalcet  30 mg Oral QODAY  . [START ON 07/11/2013] famotidine  20 mg Oral Daily  . furosemide  80 mg Oral Q breakfast  . heparin  5,000 Units Subcutaneous Q8H  . losartan  100 mg Oral Daily  . metoprolol  100 mg Oral BID  . mycophenolate  500 mg Oral BID  . simvastatin  20 mg Oral QHS  . sodium chloride  3 mL Intravenous Q12H  . tacrolimus  2 mg Oral BID   Continuous Infusions:  PRN Meds:.acetaminophen, morphine injection, ondansetron (ZOFRAN) IV, ondansetron Assessment/Plan: Principal Problem:   Acute respiratory failure with hypoxia Active Problems:   Diastolic CHF   CAD (coronary artery disease)   Hypertension   Gout   Dyspnea   Pleural effusion, bilateral  Acute Respiratory Failure  Patient presented with increased SOB and dyspnea. She reports compliance with her home lasix regimen. ProBNP was elevated to 2132 on admission.  Weight was unchanged from last visit in (December 2013).  Bilateral pleural effusions seen on chest CT. At baseline she is able to do whatever she whatever she wants  without shortness of breath. Cardiac ischemia was ruled out on EKG and CE. Medications are a possible etiology as mycophenolate is known to be associated with dyspnea and pleural effusion. Follow up CXR with improvement of effusion with diuresis.   07/09/13 TTE showed LVEF 60-65% with moderate LVH.  PA peak 34. Grade 2 diastolic dysfunction. IR performed thoracocentesis 12/9 with 500 cc of fluid removed .   Plan  - consult cardiology for further evaluation  - pleural fluid analysis to delineate the cause of effusion  - cont with lasix for diuresis (po lasix 80 mg daily). She is responding well  -Continue daily weights   Diastolic heart failure- proBNP decreased from previous value in 2013.  2455 to 2132.  Previous TTE on 04/20/12 with LVEF 70% with diastolic dysfunction (grade ?).  On exam, fine crackles at R base.  Pt without pedal edema.  She appears euvolemic on exam.  However, she has lost 9 lbs.  since admission.  See above for TTE.   -po lasix 80mg  in AM -Continue Metoprolol, Simvastatin, losartan, ASA   CAD (coronary artery disease) -troponin x 3 negative; EKG w/o acute changes. Cardiology consulted.   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  Followed by Dr. Briant Cedar.  Pt on FK506.  Creatinine 1.25 today which is slightly better than her baseline.  -BMET in AM  h/o Gout   -Continue allopurinol   Dispo: Disposition is deferred at this time, awaiting improvement of current medical problems.  Anticipated discharge in approximately 1-2 day(s).   The patient does have a current PCP Linward Headland, MD) and does need an [facility] hospital follow-up appointment after discharge.    .Services Needed at time of discharge: Y = Yes, Blank = No PT:   OT:   RN:   Equipment:   Other:     LOS: 3 days   Signed:  Dow Adolph, MD PGY-2 Internal Medicine Teaching Service Pager:  [phone] 07/10/2013, 5:20 PM

## 2013-07-10 NOTE — Progress Notes (Signed)
  Date: 07/10/2013  Patient name: [name]  Medical record number: [id number]  Date of birth: 1931-11-03   This patient has been seen and the plan of care was discussed with the house staff. Please see their note for complete details. I concur with their findings with the following additions/corrections: [name] has not moved around much today but was still DOE yesterday. She has only minimal pleural effusions on U/S. Since pul is not likely the cause, will consult cards to further W/U her dyspnea. She is a former smoker but quit in 1970 and usually, dyspnea (except stairs) is not an issue. Her PA peak pressure is nl so Pul HTN is unlikely. Well's score on admit was 0 - had CT but was without contrast.   Plan :  PT/OT Cards consult  Burns Spain, MD 07/10/2013, 1:59 PM

## 2013-07-10 NOTE — Progress Notes (Signed)
Pt BP 103/53 tonight, Heart rate in the 40s. 100mg  metoprolol held. Will continue to monitor. Baron Hamper, RN

## 2013-07-10 NOTE — Consult Note (Signed)
CARDIOLOGY CONSULT NOTE  Patient ID: [name] MRN: [id number] DOB/AGE: Nov 03, 1931 79 y.o.  Admit date: 07/07/2013 Primary Physician: [name] Primary Cardiologist: [name] Reason for Consultation: CHF  HPI:  79 yo with history of CAD s/p distant RCA PCI and FSGN s/p renal tranplant as well as diastolic CHF now in hospital with acute on chronic diastolic CHF and pleural effusions.  Patient was admitted on 12/6 with dyspnea.  Since Thanksgiving, she had noted dyspnea with exertion as well as orthopnea.  She had also had non-exertional chest pressure.  She was thought to be volume overloaded at admission and was noted to have bilateral R>L pleural effusions.  Cardiac enzymes were negative.  She was given one dose of 80 mg IV Lasix then apparently put back on Lasix 80 mg po daily (was taking 80 mg po bid at home).  She had right-sided thoracentesis today with removal of 500 cc fluid.  Overall, she says that she is feeling much better compared to admission.  This is surprising as her weight has not changed much.  Creatinine rose a bit but is stable.  Echo showed normal EF with diastolic dysfunction.    Review of systems complete and found to be negative unless listed above in HPI  Past Medical History: 1. Focal segment glomerulonephrosis s/p renal transplant in 2001-12-16.   2. Diastolic CHF: Echo (12/14) with EF 60-65%, grade II diastolic dysfunction, moderate LVH, normal RV size and systolic function, PA systolic pressure 31 mmHg.  3. H/o PNA 4. CAD: s/p RCA PCI back in Dec 16, 2001.  Does not remember her prior cardiologist.  5. Gout 6. HTN 7. Cholelithiasis 8. Abdominal hysterectomy  FH: Father and brother with CAD  History   Social History  . Marital Status: Divorced    Spouse Name: N/A    Number of Children: N/A  . Years of Education: N/A   Occupational History  . Not on file.   Social History Main Topics  . Smoking status: Former Smoker    Quit date: 04/19/1969  .  Smokeless tobacco: Never Used  . Alcohol Use: No  . Drug Use: No  . Sexual Activity: No   Other Topics Concern  . Not on file   Social History Narrative   Patient's husband died in Dec 16, 2008.    Current Medications . allopurinol  100 mg Oral QHS  . amLODipine  10 mg Oral Daily  . aspirin EC  81 mg Oral Daily  . cinacalcet  30 mg Oral QODAY  . [START ON 07/11/2013] famotidine  20 mg Oral Daily  . furosemide  80 mg Oral BID  . heparin  5,000 Units Subcutaneous Q8H  . [START ON 07/11/2013] losartan  50 mg Oral Daily  . metoprolol  100 mg Oral BID  . mycophenolate  500 mg Oral BID  . simvastatin  20 mg Oral QHS  . sodium chloride  3 mL Intravenous Q12H  . tacrolimus  2 mg Oral BID     Prescriptions prior to admission  Medication Sig Dispense Refill  . acetaminophen (TYLENOL) 325 MG tablet Take 650 mg by mouth every 6 (six) hours as needed. For pain      . allopurinol (ZYLOPRIM) 100 MG tablet Take 100 mg by mouth at bedtime.      Marland Kitchen amLODipine (NORVASC) 10 MG tablet Take 10 mg by mouth Daily.      Marland Kitchen aspirin EC 81 MG tablet Take 81 mg by mouth daily.      Marland Kitchen  cinacalcet (SENSIPAR) 30 MG tablet Take 30 mg by mouth every other day.       . colchicine 0.6 MG tablet Take 1 tablet (0.6 mg total) by mouth daily.  7 tablet  0  . docusate sodium (COLACE) 100 MG capsule Take 100 mg by mouth 2 (two) times daily as needed. For constipation      . famotidine (PEPCID) 20 MG tablet Take 20 mg by mouth 2 (two) times daily.      . furosemide (LASIX) 80 MG tablet Take 80 mg by mouth 2 (two) times daily.       Marland Kitchen HYDROcodone-acetaminophen (NORCO/VICODIN) 5-325 MG per tablet 1-2 tablets po q 6 hours prn moderate to severe pain  20 tablet  0  . losartan (COZAAR) 100 MG tablet Take 100 mg by mouth daily.      . metoprolol (LOPRESSOR) 50 MG tablet Take 100 mg by mouth 2 (two) times daily.      . mycophenolate (CELLCEPT) 250 MG capsule Take 500 mg by mouth 2 (two) times daily.      . simvastatin (ZOCOR) 20 MG  tablet Take 20 mg by mouth at bedtime.       . tacrolimus (PROGRAF) 1 MG capsule Take 2 mg by mouth 2 (two) times daily.         Physical exam Blood pressure 110/40, pulse 55, temperature 98 F (36.7 C), temperature source Oral, resp. rate 18, height 5\' 3"  (1.6 m), weight 73.664 kg (162 lb 6.4 oz), SpO2 98.00%. General: NAD Neck: Dilated EJ, no thyromegaly or thyroid nodule.  Lungs: Decreased breath sounds at bases bilaterally.  CV: Nondisplaced PMI.  Heart regular S1/S2, no S3/S4, no murmur.  No peripheral edema.  No carotid bruit.  Normal pedal pulses.  Abdomen: Soft, nontender, no hepatosplenomegaly, no distention.  Skin: Intact without lesions or rashes.  Neurologic: Alert and oriented x 3.  Psych: Normal affect. Extremities: No clubbing or cyanosis.  HEENT: Normal.   Labs:   Lab Results  Component Value Date   WBC 6.3 07/08/2013   HGB 12.2 07/08/2013   HCT 37.8 07/08/2013   MCV 91.7 07/08/2013   PLT 182 07/08/2013    Recent Labs Lab 07/09/13 0533 07/10/13 0633  NA 139 139  K 5.1 4.1  CL 105 103  CO2 24 26  BUN 30* 31*  CREATININE 1.62* 1.56*  CALCIUM 9.7 9.5  PROT 6.8  --   BILITOT 0.4  --   ALKPHOS 127*  --   ALT 6  --   AST 16  --   GLUCOSE 99 91   Lab Results  Component Value Date   TROPONINI <0.30 07/08/2013     Radiology: - CT and CXR with bilateral R>L pleural effusions  EKG: NSR, old ASMI  ASSESSMENT AND PLAN:  1. Acute on chronic diastolic CHF: Patient has not diuresed much but did have a thoracentesis and subjectively feels better.  Echo with preserved EF.  She is no more than mildly volume overloaded on exam.  - Would put her back on Lasix 80 mg po bid (getting 80 mg po daily currently).   2. CAD: Distant RCA PCI.  Interestingly, ECG looks like an old ASMI.  She had negative cardiac enzymes at admission and only atypical chest pain.  Will arrange for outpatient Cardiolite.  3. CKD: Creatinine rose mildly with diuresis, stable today.  As BP is soft,  would cut back on losartan to 50 mg daily.  Would put her back  on Lasix 80 mg po bid.   [name] 07/10/2013 5:50 PM

## 2013-07-10 NOTE — Progress Notes (Signed)
HR down to 38 nonsustained, now in 40s. MD on call notified of HR and that metoprolol was held tonight. Baron Hamper, RN

## 2013-07-10 NOTE — Progress Notes (Addendum)
PULMONARY  / CRITICAL CARE MEDICINE  Name: [name] MRN: [id number] DOB: 1932/06/08    ADMISSION DATE:  07/07/2013 CONSULTATION DATE:  12/7  REFERRING MD :  Dr. Rogelia Boga PRIMARY SERVICE:  [facility]  CHIEF COMPLAINT:  Dyspnea  BRIEF PATIENT DESCRIPTION: 79 y/o F remote smoker admitted with progressive dyspnea. Found to have bilateral pleural effusions.    SIGNIFICANT EVENTS / STUDIES:  12/06 - Admit with progressive dyspnea.  CT Chest>>>mod bilateral effusions, RL atx, dense CAD 12/07 - PCCM consulted for bilateral pleural effusions 12/08 - ECHO>>EF 60-65%, mod LVH, Grade 2 Diastolic dysfunction, PA peak 34   LINES / TUBES:  CULTURES:  ANTIBIOTICS:   SUBJECTIVE: No acute events.    VITAL SIGNS: Temp:  [97.4 F (36.3 C)-98.3 F (36.8 C)] 98 F (36.7 C) (12/09 0458) Pulse Rate:  [52-61] 58 (12/09 0458) Resp:  [16-18] 16 (12/09 0458) BP: (120-134)/(41-51) 128/45 mmHg (12/09 0458) SpO2:  [94 %-99 %] 94 % (12/09 0458) Weight:  [162 lb 6.4 oz (73.664 kg)-163 lb (73.936 kg)] 162 lb 6.4 oz (73.664 kg) (12/09 4098)  PHYSICAL EXAMINATION: General:  wdwn elderly female in NAD Neuro:  AAOx4, speech clear, [name] HEENT:  Mm pink/moist, no jvd Cardiovascular:  s1s2 rrr, no m/r/g Lungs:  resp's even/non-labored, diminished on R, left clear Abdomen:  Round/soft, bsx4 active Musculoskeletal:  No acute deformities Skin:  Warm/dry, no sig edema   Recent Labs Lab 07/08/13 0524 07/09/13 0533 07/10/13 0633  NA 139 139 139  K 4.1 5.1 4.1  CL 105 105 103  CO2 21 24 26   BUN 22 30* 31*  CREATININE 1.25* 1.62* 1.56*  GLUCOSE 92 99 91    Recent Labs Lab 07/07/13 1658 07/08/13 0524  HGB 13.1 12.2  HCT 39.8 37.8  WBC 7.3 6.3  PLT 221 182   Dg Chest 2 View  07/09/2013   CLINICAL DATA:  Pleural effusion  EXAM: CHEST  2 VIEW  COMPARISON:  07/07/2013  FINDINGS: Moderately large right pleural effusion and right lower lobe consolidation unchanged. Smaller left effusion and left lower  lobe atelectasis also unchanged  Mild cardiac enlargement. Negative for vascular congestion or edema.  IMPRESSION: Bilaterally fusion atelectasis unchanged. Negative for Pulmonary vascular congestion or edema at this time.   Electronically Signed   By: Marlan Palau [name]   On: 07/09/2013 13:26    ASSESSMENT / PLAN:  Progressive Dyspnea Bilateral Pleural Effusions Decompensated Diastolic CHF CAD - noted on CTA, in coronary arteries and SVC  79 y/o F with CAD, DCHF admitted with progressive dyspnea.  Found to have small to moderate bilateral pleural effusions (R>L).  Has responded well to gentle diuresis.  Negative balance and weight change from 162 to 156. New weights entered in chart 12/9 - ?? Validity of scales.  Appears clinically dry / negative.    Plan: -continue gentle diuresis -f/u cxr post diuresis with improvement in effusion  -oxygen to keep saturations > 92% -trend CXR's  -pulmonary hygiene   Joy Brim, NP-C [facility] [name]: [phone] or [phone]  Independently examined pt, evaluated data & formulated above care plan with NP who scribed this note & edited by me.  [name],Joy V.  [phone]  07/10/2013, 11:34 AM

## 2013-07-10 NOTE — Progress Notes (Signed)
Bedside US Assessment:  Minimal R Pleural Fluid.  Not enough for thoracentesis.    Canary Brim, NP-C [facility] [name]: [phone] or [phone]

## 2013-07-11 ENCOUNTER — Inpatient Hospital Stay (HOSPITAL_COMMUNITY): Payer: Medicare Other

## 2013-07-11 DIAGNOSIS — J9600 Acute respiratory failure, unspecified whether with hypoxia or hypercapnia: Secondary | ICD-10-CM | POA: Diagnosis not present

## 2013-07-11 DIAGNOSIS — I509 Heart failure, unspecified: Secondary | ICD-10-CM | POA: Diagnosis not present

## 2013-07-11 DIAGNOSIS — I5033 Acute on chronic diastolic (congestive) heart failure: Secondary | ICD-10-CM | POA: Diagnosis not present

## 2013-07-11 DIAGNOSIS — J90 Pleural effusion, not elsewhere classified: Secondary | ICD-10-CM | POA: Diagnosis not present

## 2013-07-11 DIAGNOSIS — I5030 Unspecified diastolic (congestive) heart failure: Secondary | ICD-10-CM | POA: Diagnosis not present

## 2013-07-11 LAB — BASIC METABOLIC PANEL
Calcium: 9.3 mg/dL (ref 8.4–10.5)
GFR calc Af Amer: 31 mL/min — ABNORMAL LOW (ref >90)
GFR calc non Af Amer: 27 mL/min — ABNORMAL LOW (ref >90)
Glucose, Bld: 82 mg/dL (ref 70–99)
Potassium: 4.1 mEq/L (ref 3.5–5.1)
Sodium: 136 mEq/L (ref 135–145)

## 2013-07-11 LAB — GLUCOSE, SEROUS FLUID: Glucose, Fluid: 117 mg/dL

## 2013-07-11 MED ORDER — METOPROLOL TARTRATE 50 MG PO TABS: 50.0000 mg | ORAL_TABLET | Freq: Two times a day (BID) | ORAL | Status: DC

## 2013-07-11 MED ORDER — LOSARTAN POTASSIUM 50 MG PO TABS: 50.0000 mg | ORAL_TABLET | Freq: Every day | ORAL | Status: DC

## 2013-07-11 NOTE — Progress Notes (Signed)
Patient ID: [name], female   DOB: 1931/11/01, 79 y.o.   MRN: [id number]   SUBJECTIVE: HR in the 40s overnight, metoprolol dose decreased.  Patient reports mild dyspnea walking but much better than prior to admission.  Marland Kitchen allopurinol  100 mg Oral QHS  . amLODipine  10 mg Oral Daily  . aspirin EC  81 mg Oral Daily  . cinacalcet  30 mg Oral QODAY  . famotidine  20 mg Oral Daily  . furosemide  80 mg Oral BID  . heparin  5,000 Units Subcutaneous Q8H  . losartan  50 mg Oral Daily  . metoprolol  50 mg Oral BID  . mycophenolate  500 mg Oral BID  . simvastatin  20 mg Oral QHS  . sodium chloride  3 mL Intravenous Q12H  . tacrolimus  2 mg Oral BID     Filed Vitals:   07/10/13 2100 07/10/13 2208 07/11/13 0408 07/11/13 0410  BP: 103/53  125/60   Pulse: 50 47 57   Temp: 98.6 F (37 C)  97.9 F (36.6 C)   TempSrc: Oral  Oral   Resp: 18  18   Height:      Weight:    73.165 kg (161 lb 4.8 oz)  SpO2: 100%  100%     Intake/Output Summary (Last 24 hours) at 07/11/13 0834 Last data filed at 07/10/13 2221  Gross per 24 hour  Intake    720 ml  Output    650 ml  Net     70 ml    LABS: Basic Metabolic Panel:  Recent Labs  11/91/47 2230 07/11/13 0406  NA 136 136  K 4.2 4.1  CL 104 101  CO2 23 25  GLUCOSE 101* 82  BUN 36* 37*  CREATININE 1.76* 1.72*  CALCIUM 9.2 9.3   Liver Function Tests:  Recent Labs  07/09/13 0533 07/10/13 2230  AST 16 17  ALT 6 6  ALKPHOS 127* 116  BILITOT 0.4 0.4  PROT 6.8 6.6  ALBUMIN 3.6 3.6   No results found for this basename: LIPASE, AMYLASE,  in the last 72 hours CBC: No results found for this basename: WBC, NEUTROABS, HGB, HCT, MCV, PLT,  in the last 72 hours Cardiac Enzymes:  Recent Labs  07/08/13 1645  TROPONINI <0.30   BNP: No components found with this basename: POCBNP,  D-Dimer: No results found for this basename: DDIMER,  in the last 72 hours Hemoglobin A1C: No results found for this basename: HGBA1C,  in the last 72  hours Fasting Lipid Panel: No results found for this basename: CHOL, HDL, LDLCALC, TRIG, CHOLHDL, LDLDIRECT,  in the last 72 hours Thyroid Function Tests: No results found for this basename: TSH, T4TOTAL, FREET3, T3FREE, THYROIDAB,  in the last 72 hours Anemia Panel: No results found for this basename: VITAMINB12, FOLATE, FERRITIN, TIBC, IRON, RETICCTPCT,  in the last 72 hours  RADIOLOGY: Dg Chest 1 View  07/10/2013   CLINICAL DATA:  Status post right-sided thoracentesis  EXAM: CHEST - 1 VIEW  COMPARISON:  07/09/2013  FINDINGS: Cardiac shadow remains enlarged. There is been interval right-sided thoracentesis with reduction in right-sided pleural effusion. No pneumothorax is noted. A small left-sided pleural effusion is stable. No focal infiltrate is seen.  IMPRESSION: Improvement in right effusion following thoracentesis. No pneumothorax is noted.   Electronically Signed   By: Alcide Clever [name]   On: 07/10/2013 15:23   Dg Chest 2 View  07/11/2013   CLINICAL DATA:  Shortness  of breath and effusion  EXAM: CHEST  2 VIEW  COMPARISON:  07/10/2013  FINDINGS: Cardiac shadow is stable. The left lung remains clear with a small pleural effusion. Small effusion is again noted on the right and stable from the post thoracentesis film. There is now however increased alveolar density in the right lower lobe. This may represent some re-expansion edema secondary to the thoracentesis. Followup films are recommended. No acute bony abnormality is seen.  IMPRESSION: New alveolar density in the right lower lobe which may represent some re-expansion edema. Followup films are recommended.   Electronically Signed   By: Alcide Clever [name]   On: 07/11/2013 08:01   Dg Chest 2 View  07/09/2013   CLINICAL DATA:  Pleural effusion  EXAM: CHEST  2 VIEW  COMPARISON:  07/07/2013  FINDINGS: Moderately large right pleural effusion and right lower lobe consolidation unchanged. Smaller left effusion and left lower lobe atelectasis also  unchanged  Mild cardiac enlargement. Negative for vascular congestion or edema.  IMPRESSION: Bilaterally fusion atelectasis unchanged. Negative for Pulmonary vascular congestion or edema at this time.   Electronically Signed   By: Marlan Palau [name]   On: 07/09/2013 13:26   Dg Chest 2 View  07/07/2013   CLINICAL DATA:  Shortness of Breath.  EXAM: CHEST  2 VIEW  COMPARISON:  04/25/2012.  FINDINGS: Increased bibasilar airspace opacity and pleural fluid with obscuration of the heart borders. The pulmonary vasculature remains mildly prominent. Diffuse osteopenia and mild scoliosis.  IMPRESSION: 1. Increased bibasilar atelectasis and pleural fluid. 2. Stable mild pulmonary vascular congestion.   Electronically Signed   By: Gordan Payment [name]   On: 07/07/2013 18:17   Ct Chest Wo Contrast  07/07/2013   CLINICAL DATA:  Shortness of breath. Cough and chest congestion. Chest pain.  EXAM: CT CHEST WITHOUT CONTRAST  TECHNIQUE: Multidetector CT imaging of the chest was performed following the standard protocol without IV contrast.  COMPARISON:  Chest radiographs obtained earlier today and chest CT dated 04/21/2012.  FINDINGS: Small to moderate-sized bilateral pleural effusions. Bilateral dense lower lobe atelectasis, greater on the right. There is also atelectasis in the right middle lobe, lingula and inferior right upper lobe. No patchy airspace opacity suspicious for pneumonia is visualized.  The 5 mm nodule previously demonstrated on the minor fissure is no longer demonstrated. Currently, there is a vague area of ill-defined increased density at that location measuring 5 mm in maximum diameter on image number 30. This has an appearance compatible with a small plaque-like area of pleural thickening on sagittal image number 23. No discrete nodules are seen elsewhere in either lung at this time. No enlarged lymph nodes.  Dense coronary artery calcifications are again demonstrated. The heart is mildly enlarged. Previously  described calcifications in the superior vena cava. The inferior left lobe of the thyroid gland remains enlarged, without a discrete mass visualized. Coarse, benign appearing left breast calcifications are noted. Thoracic spine degenerative changes. The included upper abdomen is unremarkable.  IMPRESSION: 1. Small to moderate-sized bilateral pleural effusions. 2. Bilateral lower lobe, right middle lobe, lingular and inferior right upper lobe atelectasis. 3. Dense atheromatous coronary artery calcifications. 4. Previously noted calcification in the superior vena cava. 5. Stable thyroid goiter. 6. No discrete lung nodules seen today.   Electronically Signed   By: Gordan Payment [name]   On: 07/07/2013 19:50   US Thoracentesis Asp Pleural Space W/img Guide  07/10/2013   CLINICAL DATA:  Dyspnea on exertion, recurrent pleural effusion.  Request diagnostic and therapeutic thoracentesis  EXAM: ULTRASOUND GUIDED right THORACENTESIS  COMPARISON:  None.  FINDINGS: A total of approximately 500 mL of clear yellow fluid was removed. A fluid sample wassent for laboratory analysis.  IMPRESSION: Successful ultrasound guided right thoracentesis yielding 500 mL of pleural fluid.  Read by: Brayton El PA-C  PROCEDURE: An ultrasound guided thoracentesis was thoroughly discussed with the patient and questions answered. The benefits, risks, alternatives and complications were also discussed. The patient understands and wishes to proceed with the procedure. Written consent was obtained.  Ultrasound was performed to localize and mark an adequate pocket of fluid in the right chest. The area was then prepped and draped in the normal sterile fashion. 1% Lidocaine was used for local anesthesia. Under ultrasound guidance a 19 gauge Yueh catheter was introduced. Thoracentesis was performed. The catheter was removed and a dressing applied.  Complications:  None immediate   Electronically Signed   By: Simonne Come [name]   On: 07/10/2013 15:15     PHYSICAL EXAM General: NAD Neck: No JVD (dilated EJ when lying down), no thyromegaly or thyroid nodule.  Lungs: Decreased breath sounds bilateral bases.  CV: Nondisplaced PMI.  Heart regular S1/S2, no S3/S4, no murmur.  No peripheral edema.  No carotid bruit.  Normal pedal pulses.  Abdomen: Soft, nontender, no hepatosplenomegaly, no distention.  Neurologic: Alert and oriented x 3.  Psych: Normal affect. Extremities: No clubbing or cyanosis.   TELEMETRY: Reviewed telemetry pt in NSR  ASSESSMENT AND PLAN: 1. Acute on chronic diastolic CHF: Patient has not diuresed much but did have a thoracentesis and subjectively feels better. Echo with preserved EF. She is no more than mildly volume overloaded on exam.  - Would continue Lasix 80 mg po bid.  2. CAD: Distant RCA PCI. Interestingly, ECG looks like an old ASMI. She had negative cardiac enzymes at admission and only atypical chest pain. Will arrange for outpatient Cardiolite.  3. CKD: Creatinine rose mildly with diuresis, stable today. As BP is soft, I cut back on losartan to 50 mg daily.   Will arrange for followup and outpatient stress.   Marca Ancona 07/11/2013 8:36 AM

## 2013-07-11 NOTE — Progress Notes (Signed)
PULMONARY  / CRITICAL CARE MEDICINE  Name: [name] MRN: [id number] DOB: 08/04/1931    ADMISSION DATE:  07/07/2013 CONSULTATION DATE:  12/7  REFERRING MD :  Dr. Rogelia Boga PRIMARY SERVICE:  [facility]  CHIEF COMPLAINT:  Dyspnea  BRIEF PATIENT DESCRIPTION: 79 y/o F remote smoker admitted with progressive dyspnea. Found to have bilateral pleural effusions.    SIGNIFICANT EVENTS / STUDIES:  12/06 - Admit with progressive dyspnea.  CT Chest>>>mod bilateral effusions, RL atx, dense CAD 12/07 - PCCM consulted for bilateral pleural effusions 12/08 - ECHO>>EF 60-65%, mod LVH, Grade 2 Diastolic dysfunction, PA peak 34 12/09 - Thora with 500 ml yellow fluid removed   CULTURES: Pleural Fluid Culture 12/9>>>    SUBJECTIVE: No acute events.    VITAL SIGNS: Temp:  [97.9 F (36.6 C)-98.6 F (37 C)] 97.9 F (36.6 C) (12/10 0408) Pulse Rate:  [47-57] 57 (12/10 0938) Resp:  [18] 18 (12/10 0408) BP: (103-125)/(40-60) 112/42 mmHg (12/10 0938) SpO2:  [98 %-100 %] 100 % (12/10 0408) Weight:  [161 lb 4.8 oz (73.165 kg)] 161 lb 4.8 oz (73.165 kg) (12/10 0410)  PHYSICAL EXAMINATION: General:  wdwn elderly female in NAD Neuro:  AAOx4, speech clear, [name] HEENT:  Mm pink/moist, no jvd Cardiovascular:  s1s2 rrr, no m/r/g Lungs:  resp's even/non-labored, diminished on R, left clear Abdomen:  Round/soft, bsx4 active Musculoskeletal:  No acute deformities Skin:  Warm/dry, no sig edema   Recent Labs Lab 07/10/13 0633 07/10/13 2230 07/11/13 0406  NA 139 136 136  K 4.1 4.2 4.1  CL 103 104 101  CO2 26 23 25   BUN 31* 36* 37*  CREATININE 1.56* 1.76* 1.72*  GLUCOSE 91 101* 82    Recent Labs Lab 07/07/13 1658 07/08/13 0524  HGB 13.1 12.2  HCT 39.8 37.8  WBC 7.3 6.3  PLT 221 182   Dg Chest 1 View  07/10/2013   CLINICAL DATA:  Status post right-sided thoracentesis  EXAM: CHEST - 1 VIEW  COMPARISON:  07/09/2013  FINDINGS: Cardiac shadow remains enlarged. There is been interval right-sided  thoracentesis with reduction in right-sided pleural effusion. No pneumothorax is noted. A small left-sided pleural effusion is stable. No focal infiltrate is seen.  IMPRESSION: Improvement in right effusion following thoracentesis. No pneumothorax is noted.   Electronically Signed   By: Alcide Clever [name]   On: 07/10/2013 15:23   Dg Chest 2 View  07/11/2013   CLINICAL DATA:  Shortness of breath and effusion  EXAM: CHEST  2 VIEW  COMPARISON:  07/10/2013  FINDINGS: Cardiac shadow is stable. The left lung remains clear with a small pleural effusion. Small effusion is again noted on the right and stable from the post thoracentesis film. There is now however increased alveolar density in the right lower lobe. This may represent some re-expansion edema secondary to the thoracentesis. Followup films are recommended. No acute bony abnormality is seen.  IMPRESSION: New alveolar density in the right lower lobe which may represent some re-expansion edema. Followup films are recommended.   Electronically Signed   By: Alcide Clever [name]   On: 07/11/2013 08:01   US Thoracentesis Asp Pleural Space W/img Guide  07/10/2013   CLINICAL DATA:  Dyspnea on exertion, recurrent pleural effusion. Request diagnostic and therapeutic thoracentesis  EXAM: ULTRASOUND GUIDED right THORACENTESIS  COMPARISON:  None.  FINDINGS: A total of approximately 500 mL of clear yellow fluid was removed. A fluid sample wassent for laboratory analysis.  IMPRESSION: Successful ultrasound guided right thoracentesis yielding  500 mL of pleural fluid.  Read by: Brayton El PA-C  PROCEDURE: An ultrasound guided thoracentesis was thoroughly discussed with the patient and questions answered. The benefits, risks, alternatives and complications were also discussed. The patient understands and wishes to proceed with the procedure. Written consent was obtained.  Ultrasound was performed to localize and mark an adequate pocket of fluid in the right chest. The area  was then prepped and draped in the normal sterile fashion. 1% Lidocaine was used for local anesthesia. Under ultrasound guidance a 19 gauge Yueh catheter was introduced. Thoracentesis was performed. The catheter was removed and a dressing applied.  Complications:  None immediate   Electronically Signed   By: Simonne Come [name]   On: 07/10/2013 15:15    ASSESSMENT / PLAN:  Progressive Dyspnea Bilateral Pleural Effusions Decompensated Diastolic CHF CAD - noted on CTA, in coronary arteries and SVC  79 y/o F with CAD, DCHF admitted with progressive dyspnea.  Found to have small to moderate bilateral pleural effusions (R>L).  Has responded well to gentle diuresis.  Negative balance and weight change from 162 to 156. New weights entered in chart 12/9 - ?? Validity of scales.  Appears clinically dry / negative.  S/p thora on 12/9 with 500 ml fluid removed - LDH ratio 0.7, and Protein 0.4.  Reflective of transudative effusion (plerual fluid likely there for extended period of time and LDH affected by diuresis).  Plan: -diuresis per Cardiology -oxygen to keep saturations > 92% -rec f/u cxr with PCP or Cardiology post discharge -pulmonary hygiene    Canary Brim, NP-C [facility] [name]: [phone] or [phone]  Independently examined pt, evaluated data & formulated above care plan with NP who scribed this note & edited by me.  ALVA,RAKESH V.  07/11/2013, 12:14 PM

## 2013-07-11 NOTE — Progress Notes (Signed)
  Date: 07/11/2013  Patient name: [name]  Medical record number: [id number]  Date of birth: Dec 23, 1931   This patient has been seen and the plan of care was discussed with the house staff. Please see their note for complete details. I concur with their findings with the following additions/corrections: [name] feels better with less dyspnea but still with DOE. Cards will set up outpt stress test. She will get home PT and cardiopul rehab may be considered after the stress test. We will cont her lasix. Outpt F/U.  Burns Spain, MD 07/11/2013, 2:05 PM

## 2013-07-11 NOTE — Evaluation (Signed)
Occupational Therapy Evaluation Patient Details Name: [name] MRN: [id number] DOB: [birth year] Today's Date: 07/11/2013 Time: 4098-1191 OT Time Calculation (min): 33 min  OT Assessment / Plan / Recommendation History of present illness [name] is a 79 year old AA female presented to the [facility] today accompanied by her daughter for increased SOB and intermittent chest pressure since thanksgiving (9 days).  PMH of diastolic CHF, CAD, HTN, status post kidney transplant 2003.    Clinical Impression   Pt is independent and and safe with all ADLs and ADL mobility and no further acute OT services are indicated at this time. Pt requires rest periods after moderate exertion.  Pt provided with energy conservation trg with handout provided. All education completed, OT will sign off    OT Assessment  Patient does not need any further OT services    Follow Up Recommendations  No OT follow up    Barriers to Discharge  none    Equipment Recommendations  None recommended by OT    Recommendations for Other Services    Frequency       Precautions / Restrictions Restrictions Weight Bearing Restrictions: No   Pertinent Vitals/Pain No c/o pain    ADL  Grooming: Performed;Wash/dry hands;Wash/dry face;Independent Upper Body Bathing: Simulated;Independent Lower Body Bathing: Simulated;Independent Upper Body Dressing: Performed;Independent Lower Body Dressing: Performed;Independent Toilet Transfer: Performed;Independent Acupuncturist: Nurse, children's and Hygiene: Performed Where Assessed - Engineer, mining and Hygiene: Standing Tub/Shower Transfer: Performed Web designer Method: Science writer: Grab bars;Walk in shower ADL Comments: pt provided with education and demo of energy soncervation techniques, A/E    OT Diagnosis:    OT Problem List:   OT Treatment Interventions:      OT Goals(Current goals can be found in the care plan section) Acute Rehab OT Goals Patient Stated Goal: remain independent  Visit Information  Last OT Received On: 07/11/13 Assistance Needed: +1 History of Present Illness: [name] is a 79 year old AA female presented to the [facility] today accompanied by her daughter for increased SOB and intermittent chest pressure since thanksgiving (9 days).  PMH of diastolic CHF, CAD, HTN, status post kidney transplant 2003.        Prior Functioning     Home Living Family/patient expects to be discharged to:: Private residence Living Arrangements: Other relatives Available Help at Discharge: Family Type of Home: Apartment Home Access: Level entry Home Layout: One level Home Equipment: Cane - single point;Shower seat Prior Function Level of Independence: Independent Comments: occasionally uses her cane when Rt knee is hurting and can "give away" Communication Communication: No difficulties Dominant Hand: Right         Vision/Perception Vision - History Baseline Vision: Wears glasses all the time Patient Visual Report: No change from baseline Perception Perception: Within Functional Limits   Cognition  Cognition Arousal/Alertness: Awake/alert Behavior During Therapy: WFL for tasks assessed/performed Overall Cognitive Status: Within Functional Limits for tasks assessed    Extremity/Trunk Assessment Upper Extremity Assessment Upper Extremity Assessment: Overall WFL for tasks assessed Lower Extremity Assessment Lower Extremity Assessment: Defer to PT evaluation Cervical / Trunk Assessment Cervical / Trunk Assessment: Normal     Mobility Bed Mobility Bed Mobility: Supine to Sit;Sitting - Scoot to Edge of Bed Supine to Sit: 6: Modified independent (Device/Increase time) Sitting - Scoot to Edge of Bed: 7: Independent Transfers Transfers: Sit to Stand;Stand to Sit Sit to Stand: 7: Independent;From bed;From toilet;Other  (  comment) (walk in shower) Stand to Sit: 7: Independent;To bed;To toilet;Other (comment) (walk in shower)     Exercise     Balance Balance Balance Assessed: Yes Dynamic Sitting Balance Dynamic Sitting - Balance Support: No upper extremity supported;Feet unsupported;During functional activity Dynamic Sitting - Level of Assistance: 7: Independent Static Standing Balance Static Standing - Balance Support: No upper extremity supported Static Standing - Level of Assistance: 7: Independent Dynamic Standing Balance Dynamic Standing - Balance Support: No upper extremity supported;During functional activity Dynamic Standing - Level of Assistance: 7: Independent   End of Session OT - End of Session Activity Tolerance: Patient tolerated treatment well Patient left: in bed;with call bell/phone within reach  GO     Galen Manila 07/11/2013, 11:23 AM

## 2013-07-11 NOTE — Progress Notes (Signed)
Subjective:  [name] is feeling much better today.  She reports getting the best sleep last night in 2 months.  No new complaints. She is still short of breath mainly with exertion.  [name] saw her this AM and stated she was not desaturating with ambulation.  Objective: Vital signs in last 24 hours: Filed Vitals:   07/10/13 2208 07/11/13 0408 07/11/13 0410 07/11/13 0938  BP:  125/60  112/42  Pulse: 47 57  57  Temp:  97.9 F (36.6 C)    TempSrc:  Oral    Resp:  18    Height:      Weight:   73.165 kg (161 lb 4.8 oz)   SpO2:  100%     Weight change: -0.771 kg (-1 lb 11.2 oz)  Intake/Output Summary (Last 24 hours) at 07/11/13 1307 Last data filed at 07/11/13 0948  Gross per 24 hour  Intake    720 ml  Output    550 ml  Net    170 ml   Constitutional: Vital signs reviewed.  Patient is an elderly female sitting in bed in NAD.   Head: Normocephalic and atraumatic Eyes: PERRL, EOMI, conjunctivae normal, No scleral icterus.  Neck: Supple, Trachea midline .  Cardiovascular: RRR,normal heart sounds, pulses symmetric and intact bilaterally Pulmonary/Chest: improved air movement bilaterally; no wheezes or crackles heard  Abdominal: Soft. Non-tender, non-distended, bowel sounds are normal, no masses, organomegaly, or guarding present.  Musculoskeletal: No joint deformities noted Neurological: A&O x3, moving all extremities without difficulty  Skin: Warm, dry and intact. No rash, cyanosis, or clubbing.  Psychiatric: Normal mood and affect.    Lab Results: Basic Metabolic Panel:  Recent Labs Lab 07/10/13 2230 07/11/13 0406  NA 136 136  K 4.2 4.1  CL 104 101  CO2 23 25  GLUCOSE 101* 82  BUN 36* 37*  CREATININE 1.76* 1.72*  CALCIUM 9.2 9.3   Liver Function Tests:  Recent Labs Lab 07/09/13 0533 07/10/13 2230  AST 16 17  ALT 6 6  ALKPHOS 127* 116  BILITOT 0.4 0.4  PROT 6.8 6.6  ALBUMIN 3.6 3.6   CBC:  Recent Labs Lab 07/07/13 1658 07/08/13 0524  WBC 7.3 6.3  HGB 13.1  12.2  HCT 39.8 37.8  MCV 90.0 91.7  PLT 221 182   Cardiac Enzymes:  Recent Labs Lab 07/08/13 0030 07/08/13 0524 07/08/13 1645  TROPONINI <0.30 <0.30 <0.30   BNP:  Recent Labs Lab 07/07/13 1658  PROBNP 2132.0*   Urinalysis:  Recent Labs Lab 07/07/13 1840  COLORURINE YELLOW  LABSPEC 1.008  PHURINE 5.5  GLUCOSEU NEGATIVE  HGBUR NEGATIVE  BILIRUBINUR NEGATIVE  KETONESUR NEGATIVE  PROTEINUR NEGATIVE  UROBILINOGEN 0.2  NITRITE NEGATIVE  LEUKOCYTESUR SMALL*   Studies/Results: Dg Chest 1 View  07/10/2013   CLINICAL DATA:  Status post right-sided thoracentesis  EXAM: CHEST - 1 VIEW  COMPARISON:  07/09/2013  FINDINGS: Cardiac shadow remains enlarged. There is been interval right-sided thoracentesis with reduction in right-sided pleural effusion. No pneumothorax is noted. A small left-sided pleural effusion is stable. No focal infiltrate is seen.  IMPRESSION: Improvement in right effusion following thoracentesis. No pneumothorax is noted.   Electronically Signed   By: Alcide Clever [name]   On: 07/10/2013 15:23   Dg Chest 2 View  07/11/2013   CLINICAL DATA:  Shortness of breath and effusion  EXAM: CHEST  2 VIEW  COMPARISON:  07/10/2013  FINDINGS: Cardiac shadow is stable. The left lung remains clear with a small  pleural effusion. Small effusion is again noted on the right and stable from the post thoracentesis film. There is now however increased alveolar density in the right lower lobe. This may represent some re-expansion edema secondary to the thoracentesis. Followup films are recommended. No acute bony abnormality is seen.  IMPRESSION: New alveolar density in the right lower lobe which may represent some re-expansion edema. Followup films are recommended.   Electronically Signed   By: Alcide Clever [name]   On: 07/11/2013 08:01   US Thoracentesis Asp Pleural Space W/img Guide  07/10/2013   CLINICAL DATA:  Dyspnea on exertion, recurrent pleural effusion. Request diagnostic and  therapeutic thoracentesis  EXAM: ULTRASOUND GUIDED right THORACENTESIS  COMPARISON:  None.  FINDINGS: A total of approximately 500 mL of clear yellow fluid was removed. A fluid sample wassent for laboratory analysis.  IMPRESSION: Successful ultrasound guided right thoracentesis yielding 500 mL of pleural fluid.  Read by: Brayton El PA-C  PROCEDURE: An ultrasound guided thoracentesis was thoroughly discussed with the patient and questions answered. The benefits, risks, alternatives and complications were also discussed. The patient understands and wishes to proceed with the procedure. Written consent was obtained.  Ultrasound was performed to localize and mark an adequate pocket of fluid in the right chest. The area was then prepped and draped in the normal sterile fashion. 1% Lidocaine was used for local anesthesia. Under ultrasound guidance a 19 gauge Yueh catheter was introduced. Thoracentesis was performed. The catheter was removed and a dressing applied.  Complications:  None immediate   Electronically Signed   By: Simonne Come [name]   On: 07/10/2013 15:15   Medications: I have reviewed the patient's current medications. Scheduled Meds: . allopurinol  100 mg Oral QHS  . amLODipine  10 mg Oral Daily  . aspirin EC  81 mg Oral Daily  . cinacalcet  30 mg Oral QODAY  . famotidine  20 mg Oral Daily  . furosemide  80 mg Oral BID  . heparin  5,000 Units Subcutaneous Q8H  . losartan  50 mg Oral Daily  . metoprolol  50 mg Oral BID  . mycophenolate  500 mg Oral BID  . simvastatin  20 mg Oral QHS  . sodium chloride  3 mL Intravenous Q12H  . tacrolimus  2 mg Oral BID   Continuous Infusions:  PRN Meds:.acetaminophen, morphine injection, ondansetron (ZOFRAN) IV, ondansetron Assessment/Plan: Principal Problem:   Acute respiratory failure with hypoxia Active Problems:   Diastolic CHF   CAD (coronary artery disease)   Hypertension   Gout   Dyspnea   Pleural effusion, bilateral  Acute Respiratory  Failure  Improved.  [name] presented with increased SOB and dyspnea. She reports compliance with her home lasix regimen. ProBNP was elevated to 2132 on admission.  Weight was unchanged from last visit in (December 2013).  Bilateral pleural effusions seen on chest CT. At baseline she is able to do whatever she whatever she wants without shortness of breath. Cardiac ischemia was ruled out on EKG and CE. Medications are a possible etiology as mycophenolate is known to be associated with dyspnea and pleural effusion. Follow up CXR with improvement of effusion with diuresis.   07/09/13 TTE showed LVEF 60-65% with moderate LVH.  PA peak 34. Grade 2 diastolic dysfunction. IR performed thoracocentesis 12/9 with 500 cc of fluid removed.  Pleural fluid analysis showed a transudate.  Cardiology consulted for further evaluation and recommended to continue lasix at 80mg  bid, cardiolite as an outpatient, and decrease losartan  to 50mg  daily.  Today, [name] breathing has improved significantly but still get SOB when ambulating.  [name] to get close follow up with PCP and cardiology.  Should consider cardiopulmonary rehab.    -[name] is stable for d/c today  Diastolic heart failure- Stable. proBNP decreased from previous value in 2013.  2455 to 2132.  Previous TTE on 04/20/12 with LVEF 70% with diastolic dysfunction (grade ?).  On exam, fine crackles at R base.  [name] without pedal edema.  She appears euvolemic on exam.  However, she has lost 9 lbs. since admission.  See above for TTE.   -see above -Continue lasix, Metoprolol, Simvastatin, losartan, ASA   CAD (coronary artery disease) -troponin x 3 negative; EKG w/o acute changes. Cardiology consulted.   - see above - Continue home meds  Hypertension  Stable. -see above changes  s/p kidney transplant -Stable. 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  Followed by Dr. Briant Cedar.  [name] on FK506.  Creatinine 1.25 today which is slightly better than her baseline.  h/o Gout - Stable.    -Continue allopurinol   Dispo: [name] should be discharged today.    The patient does have a current PCP Linward Headland, MD) and does need an [facility] hospital follow-up appointment after discharge.    .Services Needed at time of discharge: Y = Yes, Blank = No [name]:   OT:   RN:   Equipment:   Other:     LOS: 4 days   Signed:  Dow Adolph, MD PGY-2 Internal Medicine Teaching Service Pager: [phone] 07/11/2013, 1:07 PM

## 2013-07-11 NOTE — Discharge Summary (Signed)
Name: [name] MRN: [id number] DOB: [birth year] 79 y.o. PCP: Linward Headland, MD  Date of Admission: 07/07/2013  5:08 PM Date of Discharge: 07/11/2013 Attending Physician: Burns Spain, MD  Discharge Diagnosis: Principal Problem:   Acute respiratory failure with hypoxia Active Problems:   Diastolic CHF   CAD (coronary artery disease)   Hypertension   Gout   Dyspnea   Pleural effusion, bilateral  Discharge Medications:   Medication List         acetaminophen 325 MG tablet  Commonly known as:  TYLENOL  Take 650 mg by mouth every 6 (six) hours as needed. For pain     allopurinol 100 MG tablet  Commonly known as:  ZYLOPRIM  Take 100 mg by mouth at bedtime.     amLODipine 10 MG tablet  Commonly known as:  NORVASC  Take 10 mg by mouth Daily.     aspirin EC 81 MG tablet  Take 81 mg by mouth daily.     cinacalcet 30 MG tablet  Commonly known as:  SENSIPAR  Take 30 mg by mouth every other day.     colchicine 0.6 MG tablet  Take 1 tablet (0.6 mg total) by mouth daily.     docusate sodium 100 MG capsule  Commonly known as:  COLACE  Take 100 mg by mouth 2 (two) times daily as needed. For constipation     famotidine 20 MG tablet  Commonly known as:  PEPCID  Take 20 mg by mouth 2 (two) times daily.     furosemide 80 MG tablet  Commonly known as:  LASIX  Take 80 mg by mouth 2 (two) times daily.     HYDROcodone-acetaminophen 5-325 MG per tablet  Commonly known as:  NORCO/VICODIN  1-2 tablets po q 6 hours prn moderate to severe pain     losartan 50 MG tablet  Commonly known as:  COZAAR  Take 1 tablet (50 mg total) by mouth daily.     metoprolol 50 MG tablet  Commonly known as:  LOPRESSOR  Take 1 tablet (50 mg total) by mouth 2 (two) times daily.     mycophenolate 250 MG capsule  Commonly known as:  CELLCEPT  Take 500 mg by mouth 2 (two) times daily.     simvastatin 20 MG tablet  Commonly known as:  ZOCOR  Take 20 mg by mouth at bedtime.     tacrolimus 1 MG capsule  Commonly known as:  PROGRAF  Take 2 mg by mouth 2 (two) times daily.        Disposition and follow-up:   [name] was discharged from [facility] in Stable condition.  At the hospital follow up visit please address:  1. Continued SOB, medications (losartan decreased due to soft BP), consider cardiopulmonary rehab   2.  Labs / imaging needed at time of follow-up: BMP (creatinine elevated on last admission), Cardiology to arrange cardiolite  3.  Pending labs/ test needing follow-up: None  Follow-up Appointments:     Follow-up Information   Follow up with LI, NA, MD On 07/17/2013. (10:00am)    Specialty:  Internal Medicine   Contact information:   [address] [place] [address] [phone]       Follow up with [facility] On 07/24/2013. (11:00am)       Discharge Instructions: Discharge Orders   Future Appointments Provider Department Dept Phone   07/17/2013 10:00 AM Na Dierdre Searles, MD [facility] [phone]   07/24/2013 11:00 AM [facility] [phone]   Future Orders Complete By Expires   (HEART FAILURE PATIENTS) Call MD:  Anytime you have any of the following symptoms: 1) 3 pound weight gain in 24 hours or 5 pounds in 1 week 2) shortness of breath, with or without a dry hacking cough 3) swelling in the hands, feet or stomach 4) if you have to sleep on extra pillows at night in order to breathe.  As directed    Call MD for:  extreme fatigue  As directed    Call MD for:  persistant dizziness or light-headedness  As directed    Diet - low sodium heart healthy  As directed    Increase activity slowly  As directed       Consultations: Treatment Team:  Rounding Lbcardiology, MD  Procedures Performed:  Dg Chest 1 View  07/10/2013   CLINICAL DATA:  Status post right-sided thoracentesis  EXAM: CHEST - 1 VIEW  COMPARISON:   07/09/2013  FINDINGS: Cardiac shadow remains enlarged. There is been interval right-sided thoracentesis with reduction in right-sided pleural effusion. No pneumothorax is noted. A small left-sided pleural effusion is stable. No focal infiltrate is seen.  IMPRESSION: Improvement in right effusion following thoracentesis. No pneumothorax is noted.   Electronically Signed   By: Alcide Clever [name]   On: 07/10/2013 15:23   Dg Chest 2 View  07/11/2013   CLINICAL DATA:  Shortness of breath and effusion  EXAM: CHEST  2 VIEW  COMPARISON:  07/10/2013  FINDINGS: Cardiac shadow is stable. The left lung remains clear with a small pleural effusion. Small effusion is again noted on the right and stable from the post thoracentesis film. There is now however increased alveolar density in the right lower lobe. This may represent some re-expansion edema secondary to the thoracentesis. Followup films are recommended. No acute bony abnormality is seen.  IMPRESSION: New alveolar density in the right lower lobe which may represent some re-expansion edema. Followup films are recommended.   Electronically Signed   By: Alcide Clever [name]   On: 07/11/2013 08:01   Dg Chest 2 View  07/09/2013   CLINICAL DATA:  Pleural effusion  EXAM: CHEST  2 VIEW  COMPARISON:  07/07/2013  FINDINGS: Moderately large right pleural effusion and right lower lobe consolidation unchanged. Smaller left effusion and left lower lobe atelectasis also unchanged  Mild cardiac enlargement. Negative for vascular congestion or edema.  IMPRESSION: Bilaterally fusion atelectasis unchanged. Negative for Pulmonary vascular congestion or edema at this time.   Electronically Signed   By: Marlan Palau [name]   On: 07/09/2013 13:26   Dg Chest 2 View  07/07/2013   CLINICAL DATA:  Shortness of Breath.  EXAM: CHEST  2 VIEW  COMPARISON:  04/25/2012.  FINDINGS: Increased bibasilar airspace opacity and pleural fluid with obscuration of the heart borders. The pulmonary vasculature  remains mildly prominent. Diffuse osteopenia and mild scoliosis.  IMPRESSION: 1. Increased bibasilar atelectasis and pleural fluid. 2. Stable mild pulmonary vascular congestion.   Electronically Signed   By: Gordan Payment [name]   On: 07/07/2013 18:17   Ct Chest Wo Contrast  07/07/2013   CLINICAL DATA:  Shortness of breath. Cough and chest congestion. Chest pain.  EXAM: CT CHEST WITHOUT CONTRAST  TECHNIQUE: Multidetector CT imaging of the chest was performed following the standard protocol without IV contrast.  COMPARISON:  Chest radiographs obtained earlier today  and chest CT dated 04/21/2012.  FINDINGS: Small to moderate-sized bilateral pleural effusions. Bilateral dense lower lobe atelectasis, greater on the right. There is also atelectasis in the right middle lobe, lingula and inferior right upper lobe. No patchy airspace opacity suspicious for pneumonia is visualized.  The 5 mm nodule previously demonstrated on the minor fissure is no longer demonstrated. Currently, there is a vague area of ill-defined increased density at that location measuring 5 mm in maximum diameter on image number 30. This has an appearance compatible with a small plaque-like area of pleural thickening on sagittal image number 23. No discrete nodules are seen elsewhere in either lung at this time. No enlarged lymph nodes.  Dense coronary artery calcifications are again demonstrated. The heart is mildly enlarged. Previously described calcifications in the superior vena cava. The inferior left lobe of the thyroid gland remains enlarged, without a discrete mass visualized. Coarse, benign appearing left breast calcifications are noted. Thoracic spine degenerative changes. The included upper abdomen is unremarkable.  IMPRESSION: 1. Small to moderate-sized bilateral pleural effusions. 2. Bilateral lower lobe, right middle lobe, lingular and inferior right upper lobe atelectasis. 3. Dense atheromatous coronary artery calcifications. 4. Previously  noted calcification in the superior vena cava. 5. Stable thyroid goiter. 6. No discrete lung nodules seen today.   Electronically Signed   By: Gordan Payment [name]   On: 07/07/2013 19:50   US Thoracentesis Asp Pleural Space W/img Guide  07/10/2013   CLINICAL DATA:  Dyspnea on exertion, recurrent pleural effusion. Request diagnostic and therapeutic thoracentesis  EXAM: ULTRASOUND GUIDED right THORACENTESIS  COMPARISON:  None.  FINDINGS: A total of approximately 500 mL of clear yellow fluid was removed. A fluid sample wassent for laboratory analysis.  IMPRESSION: Successful ultrasound guided right thoracentesis yielding 500 mL of pleural fluid.  Read by: Brayton El PA-C  PROCEDURE: An ultrasound guided thoracentesis was thoroughly discussed with the patient and questions answered. The benefits, risks, alternatives and complications were also discussed. The patient understands and wishes to proceed with the procedure. Written consent was obtained.  Ultrasound was performed to localize and mark an adequate pocket of fluid in the right chest. The area was then prepped and draped in the normal sterile fashion. 1% Lidocaine was used for local anesthesia. Under ultrasound guidance a 19 gauge Yueh catheter was introduced. Thoracentesis was performed. The catheter was removed and a dressing applied.  Complications:  None immediate   Electronically Signed   By: Simonne Come [name]   On: 07/10/2013 15:15    2D Echo: Study Conclusions - Left ventricle: The cavity size was normal. Wall thickness was increased in a pattern of moderate LVH. Systolic function was normal. The estimated ejection fraction was in the range of 60% to 65%. Wall motion was normal; there were no regional wall motion abnormalities. Features are consistent with a pseudonormal left ventricular filling pattern, with concomitant abnormal relaxation and increased filling pressure (grade 2 diastolic dysfunction). E/medial e' > 15 suggests LV end  diastolic pressure at least 20 mmHg. - Aortic valve: There was no stenosis. - Mitral valve: Mildly calcified annulus. Mildly calcified leaflets . Trivial regurgitation. - Left atrium: The atrium was mildly dilated. - Right ventricle: The cavity size was normal. Systolic function was normal. - Pulmonary arteries: PA peak pressure: 34mm Hg (S). - Inferior vena cava: The vessel was normal in size; the respirophasic diameter changes were in the normal range (= 50%); findings are consistent with normal central venous pressure. - Pericardium, extracardiac: There  was a pleural effusion noted. Impressions: - Normal LV size with moderate LV hypertrophy. EF 60-65%. Moderate diastolic dysfunction with evidence for elevated LV filling pressure, but IVC is not dilated. Normal RV size and systolic function.   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 She presented to the [facility] today accompanied by her daughter for increased SOB and intermittent chest pressure since thanksgiving (9 days). She reports that since thanksgiving she has had increased orthopnea, she normally uses 2 pillows but lately she feels that she cannot lay down without becoming SOB. She reports that her chest pressure is intermittent and lasts no longer than 15 minutes. This pressure typically happens while she is sitting still and not associated with exertion. She also admits some feeling of nausea but has not vomited. She denies cough, fever, chills, dysuria, abdominal pain, constipation, diarrhea.    Hospital Course by problem list: Principal Problem:   Acute respiratory failure with hypoxia Active Problems:   Diastolic CHF   CAD (coronary artery disease)   Hypertension   Gout   Dyspnea   Pleural effusion, bilateral   Acute Respiratory Failure  Resolved. Pt  presented with increased SOB and dyspnea. She reports compliance with her home lasix regimen. ProBNP was elevated to 2132 on admission. Weight was unchanged from last visit in (December 2013). Bilateral pleural effusions seen on chest CT. At baseline she is able to do whatever she whatever she wants without shortness of breath. Cardiac ischemia was ruled out on EKG and CE. Medications are a possible etiology as mycophenolate is known to be associated with dyspnea and pleural effusion. Follow up CXR with improvement of effusion with diuresis. 07/09/13 TTE showed LVEF 60-65% with moderate LVH. PA peak 34. Grade 2 diastolic dysfunction. IR performed thoracocentesis 12/9 with 500 cc of fluid removed. Pleural fluid analysis showed a transudate. Cardiology consulted for further evaluation and recommended to continue lasix at 80mg  bid, cardiolite as an outpatient, and decrease losartan to 50mg  daily. Pt received pulmonary toilet.  Pt breathing has improved significantly but still get SOB when ambulating. Pt to get close follow up with PCP and cardiology. Should consider cardiopulmonary rehab.   Diastolic heart failure- Stable. proBNP decreased from previous value in 2013. 2455 to 2132. Previous TTE on 04/20/12 with LVEF 70% with diastolic dysfunction (grade ?). On exam, fine crackles at R base. Pt without pedal edema. She appears euvolemic on exam. However, she has lost 9 lbs. since admission. See above for TTE. We continued lasix, Metoprolol, Simvastatin, losartan, ASA.  CAD (coronary artery disease) -troponin x 3 negative; EKG w/o acute changes. Cardiology consulted.  We continued home meds.  See above.   Hypertension  Stable.   s/p kidney transplant -Stable. 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 Followed by Dr. Briant Cedar. Pt on FK506.   h/o Gout - Stable. Continued allopurinol.    Discharge Vitals:   BP 112/42  Pulse 57  Temp(Src) 97.9 F (36.6 C) (Oral)  Resp 18  Ht 5\' 3"  (1.6 m)  Wt 73.165 kg  (161 lb 4.8 oz)  BMI 28.58 kg/m2  SpO2 100%  Discharge Labs:  Results for orders placed during the hospital encounter of 07/07/13 (from the past 24 hour(s))  LACTATE DEHYDROGENASE, BODY FLUID     Status: Abnormal   Collection Time    07/10/13  2:58  PM      Result Value Range   LD, Fluid 72 (*) 3 - 23 U/L   Fluid Type-FLDH Body Fluid    BODY FLUID CELL COUNT WITH DIFFERENTIAL     Status: Abnormal   Collection Time    07/10/13  2:58 PM      Result Value Range   Fluid Type-FCT PLEURAL     Color, Fluid YELLOW  YELLOW   Appearance, Fluid HAZY (*) CLEAR   WBC, Fluid 495  0 - 1000 cu mm   Neutrophil Count, Fluid 9  0 - 25 %   Lymphs, Fluid 31     Monocyte-Macrophage-Serous Fluid 59  50 - 90 %   Eos, Fluid 1    BODY FLUID CULTURE     Status: None   Collection Time    07/10/13  2:58 PM      Result Value Range   Specimen Description PLEURAL FLUID RIGHT     Special Requests Immunocompromised     Gram Stain       Value: RARE WBC PRESENT,BOTH PMN AND MONONUCLEAR     NO ORGANISMS SEEN     Performed at [facility]   Culture       Value: NO GROWTH 1 DAY     Performed at [facility]   Report Status PENDING    PROTEIN, BODY FLUID     Status: None   Collection Time    07/10/13  2:58 PM      Result Value Range   Total protein, fluid 2.6     Fluid Type-FTP Body Fluid    GLUCOSE, SEROUS FLUID     Status: None   Collection Time    07/10/13  2:58 PM      Result Value Range   Glucose, Fluid 117     Fluid Type-FGLU PLEURAL    LACTATE DEHYDROGENASE     Status: None   Collection Time    07/10/13 10:30 PM      Result Value Range   LDH 197  94 - 250 U/L  COMPREHENSIVE METABOLIC PANEL     Status: Abnormal   Collection Time    07/10/13 10:30 PM      Result Value Range   Sodium 136  135 - 145 mEq/L   Potassium 4.2  3.5 - 5.1 mEq/L   Chloride 104  96 - 112 mEq/L   CO2 23  19 - 32 mEq/L   Glucose, Bld 101 (*) 70 - 99 mg/dL   BUN 36 (*) 6 - 23 mg/dL   Creatinine, Ser  1.61 (*) 0.50 - 1.10 mg/dL   Calcium 9.2  8.4 - 09.6 mg/dL   Total Protein 6.6  6.0 - 8.3 g/dL   Albumin 3.6  3.5 - 5.2 g/dL   AST 17  0 - 37 U/L   ALT 6  0 - 35 U/L   Alkaline Phosphatase 116  39 - 117 U/L   Total Bilirubin 0.4  0.3 - 1.2 mg/dL   GFR calc non Af Amer 26 (*) >90 mL/min   GFR calc Af Amer 31 (*) >90 mL/min  BASIC METABOLIC PANEL     Status: Abnormal   Collection Time    07/11/13  4:06 AM      Result Value Range   Sodium 136  135 - 145 mEq/L   Potassium 4.1  3.5 - 5.1 mEq/L   Chloride 101  96 - 112 mEq/L   CO2 25  19 - 32 mEq/L   Glucose, Bld 82  70 - 99 mg/dL   BUN 37 (*) 6 - 23 mg/dL   Creatinine, Ser 1.61 (*) 0.50 - 1.10 mg/dL   Calcium 9.3  8.4 - 09.6 mg/dL   GFR calc non Af Amer 27 (*) >90 mL/min   GFR calc Af Amer 31 (*) >90 mL/min    Signed: Boykin Peek, MD 07/11/2013, 12:58 PM   Time Spent on Discharge: 45 minutes Services Ordered on Discharge: None Equipment Ordered on Discharge: None

## 2013-07-12 LAB — ADENOSINE DEAMINASE, FLUID: Adenosine Deaminase, Fluid: 4.9 U/L (ref <7.6)

## 2013-07-14 LAB — BODY FLUID CULTURE

## 2013-07-17 ENCOUNTER — Encounter: Payer: Self-pay | Admitting: Internal Medicine

## 2013-07-17 ENCOUNTER — Ambulatory Visit: Payer: Medicare Other | Admitting: Internal Medicine

## 2013-07-24 ENCOUNTER — Inpatient Hospital Stay (HOSPITAL_COMMUNITY): Admit: 2013-07-24 | Payer: Medicare Other

## 2013-08-13 DIAGNOSIS — N2581 Secondary hyperparathyroidism of renal origin: Secondary | ICD-10-CM | POA: Diagnosis not present

## 2013-08-13 DIAGNOSIS — Z940 Kidney transplant status: Secondary | ICD-10-CM | POA: Diagnosis not present

## 2013-08-13 DIAGNOSIS — Z79899 Other long term (current) drug therapy: Secondary | ICD-10-CM | POA: Diagnosis not present

## 2013-08-13 DIAGNOSIS — Z452 Encounter for adjustment and management of vascular access device: Secondary | ICD-10-CM | POA: Diagnosis not present

## 2013-08-13 DIAGNOSIS — D649 Anemia, unspecified: Secondary | ICD-10-CM | POA: Diagnosis not present

## 2013-08-20 DIAGNOSIS — Z79899 Other long term (current) drug therapy: Secondary | ICD-10-CM | POA: Diagnosis not present

## 2013-08-20 DIAGNOSIS — E212 Other hyperparathyroidism: Secondary | ICD-10-CM | POA: Diagnosis not present

## 2013-08-20 DIAGNOSIS — I10 Essential (primary) hypertension: Secondary | ICD-10-CM | POA: Diagnosis not present

## 2013-08-20 DIAGNOSIS — Z940 Kidney transplant status: Secondary | ICD-10-CM | POA: Diagnosis not present

## 2013-11-12 DIAGNOSIS — D649 Anemia, unspecified: Secondary | ICD-10-CM | POA: Diagnosis not present

## 2013-11-12 DIAGNOSIS — N2581 Secondary hyperparathyroidism of renal origin: Secondary | ICD-10-CM | POA: Diagnosis not present

## 2013-11-12 DIAGNOSIS — Z940 Kidney transplant status: Secondary | ICD-10-CM | POA: Diagnosis not present

## 2013-11-12 DIAGNOSIS — Z79899 Other long term (current) drug therapy: Secondary | ICD-10-CM | POA: Diagnosis not present

## 2013-11-19 DIAGNOSIS — I10 Essential (primary) hypertension: Secondary | ICD-10-CM | POA: Diagnosis not present

## 2013-11-19 DIAGNOSIS — Z940 Kidney transplant status: Secondary | ICD-10-CM | POA: Diagnosis not present

## 2013-11-19 DIAGNOSIS — N183 Chronic kidney disease, stage 3 unspecified: Secondary | ICD-10-CM | POA: Diagnosis not present

## 2013-11-19 DIAGNOSIS — E212 Other hyperparathyroidism: Secondary | ICD-10-CM | POA: Diagnosis not present

## 2014-01-24 ENCOUNTER — Emergency Department: Payer: Self-pay | Admitting: Internal Medicine

## 2014-01-24 DIAGNOSIS — S62113A Displaced fracture of triquetrum [cuneiform] bone, unspecified wrist, initial encounter for closed fracture: Secondary | ICD-10-CM | POA: Diagnosis not present

## 2014-01-24 DIAGNOSIS — M25539 Pain in unspecified wrist: Secondary | ICD-10-CM | POA: Diagnosis not present

## 2014-01-24 DIAGNOSIS — S59909A Unspecified injury of unspecified elbow, initial encounter: Secondary | ICD-10-CM | POA: Diagnosis not present

## 2014-01-24 DIAGNOSIS — Z79899 Other long term (current) drug therapy: Secondary | ICD-10-CM | POA: Diagnosis not present

## 2014-01-24 DIAGNOSIS — Z882 Allergy status to sulfonamides status: Secondary | ICD-10-CM | POA: Diagnosis not present

## 2014-01-24 DIAGNOSIS — I10 Essential (primary) hypertension: Secondary | ICD-10-CM | POA: Diagnosis not present

## 2014-01-24 DIAGNOSIS — S59919A Unspecified injury of unspecified forearm, initial encounter: Secondary | ICD-10-CM | POA: Diagnosis not present

## 2014-01-25 DIAGNOSIS — S62109A Fracture of unspecified carpal bone, unspecified wrist, initial encounter for closed fracture: Secondary | ICD-10-CM | POA: Diagnosis not present

## 2014-02-08 DIAGNOSIS — S5290XD Unspecified fracture of unspecified forearm, subsequent encounter for closed fracture with routine healing: Secondary | ICD-10-CM | POA: Diagnosis not present

## 2014-02-11 DIAGNOSIS — E212 Other hyperparathyroidism: Secondary | ICD-10-CM | POA: Diagnosis not present

## 2014-02-11 DIAGNOSIS — Z940 Kidney transplant status: Secondary | ICD-10-CM | POA: Diagnosis not present

## 2014-02-13 DIAGNOSIS — Z79899 Other long term (current) drug therapy: Secondary | ICD-10-CM | POA: Diagnosis not present

## 2014-02-18 DIAGNOSIS — I10 Essential (primary) hypertension: Secondary | ICD-10-CM | POA: Diagnosis not present

## 2014-02-18 DIAGNOSIS — Z940 Kidney transplant status: Secondary | ICD-10-CM | POA: Diagnosis not present

## 2014-02-18 DIAGNOSIS — E212 Other hyperparathyroidism: Secondary | ICD-10-CM | POA: Diagnosis not present

## 2014-02-18 DIAGNOSIS — N183 Chronic kidney disease, stage 3 unspecified: Secondary | ICD-10-CM | POA: Diagnosis not present

## 2014-03-04 DIAGNOSIS — S5290XD Unspecified fracture of unspecified forearm, subsequent encounter for closed fracture with routine healing: Secondary | ICD-10-CM | POA: Diagnosis not present

## 2014-03-12 ENCOUNTER — Encounter (HOSPITAL_COMMUNITY): Payer: Self-pay | Admitting: Emergency Medicine

## 2014-03-12 ENCOUNTER — Inpatient Hospital Stay (HOSPITAL_COMMUNITY)
Admission: EM | Admit: 2014-03-12 | Discharge: 2014-03-21 | DRG: 368 | Disposition: A | Discharge status: Home / Self Care | Payer: Medicare Other | Attending: Internal Medicine | Admitting: Internal Medicine

## 2014-03-12 ENCOUNTER — Emergency Department (HOSPITAL_COMMUNITY): Payer: Medicare Other

## 2014-03-12 DIAGNOSIS — Z940 Kidney transplant status: Secondary | ICD-10-CM

## 2014-03-12 DIAGNOSIS — N186 End stage renal disease: Secondary | ICD-10-CM | POA: Diagnosis present

## 2014-03-12 DIAGNOSIS — B3781 Candidal esophagitis: Principal | ICD-10-CM | POA: Diagnosis present

## 2014-03-12 DIAGNOSIS — N2581 Secondary hyperparathyroidism of renal origin: Secondary | ICD-10-CM | POA: Diagnosis present

## 2014-03-12 DIAGNOSIS — K2970 Gastritis, unspecified, without bleeding: Secondary | ICD-10-CM | POA: Diagnosis present

## 2014-03-12 DIAGNOSIS — R109 Unspecified abdominal pain: Secondary | ICD-10-CM | POA: Diagnosis present

## 2014-03-12 DIAGNOSIS — I2510 Atherosclerotic heart disease of native coronary artery without angina pectoris: Secondary | ICD-10-CM | POA: Diagnosis present

## 2014-03-12 DIAGNOSIS — K5900 Constipation, unspecified: Secondary | ICD-10-CM | POA: Diagnosis present

## 2014-03-12 DIAGNOSIS — I5032 Chronic diastolic (congestive) heart failure: Secondary | ICD-10-CM | POA: Diagnosis present

## 2014-03-12 DIAGNOSIS — R197 Diarrhea, unspecified: Secondary | ICD-10-CM | POA: Diagnosis not present

## 2014-03-12 DIAGNOSIS — R6881 Early satiety: Secondary | ICD-10-CM | POA: Diagnosis not present

## 2014-03-12 DIAGNOSIS — R110 Nausea: Secondary | ICD-10-CM | POA: Diagnosis not present

## 2014-03-12 DIAGNOSIS — D649 Anemia, unspecified: Secondary | ICD-10-CM | POA: Diagnosis present

## 2014-03-12 DIAGNOSIS — E861 Hypovolemia: Secondary | ICD-10-CM | POA: Diagnosis not present

## 2014-03-12 DIAGNOSIS — M109 Gout, unspecified: Secondary | ICD-10-CM | POA: Diagnosis present

## 2014-03-12 DIAGNOSIS — N032 Chronic nephritic syndrome with diffuse membranous glomerulonephritis: Secondary | ICD-10-CM | POA: Diagnosis not present

## 2014-03-12 DIAGNOSIS — N19 Unspecified kidney failure: Secondary | ICD-10-CM | POA: Diagnosis not present

## 2014-03-12 DIAGNOSIS — Z8719 Personal history of other diseases of the digestive system: Secondary | ICD-10-CM | POA: Diagnosis present

## 2014-03-12 DIAGNOSIS — Z87891 Personal history of nicotine dependence: Secondary | ICD-10-CM | POA: Diagnosis not present

## 2014-03-12 DIAGNOSIS — M949 Disorder of cartilage, unspecified: Secondary | ICD-10-CM

## 2014-03-12 DIAGNOSIS — Z79899 Other long term (current) drug therapy: Secondary | ICD-10-CM | POA: Diagnosis not present

## 2014-03-12 DIAGNOSIS — K8020 Calculus of gallbladder without cholecystitis without obstruction: Secondary | ICD-10-CM | POA: Diagnosis present

## 2014-03-12 DIAGNOSIS — R1013 Epigastric pain: Secondary | ICD-10-CM | POA: Diagnosis not present

## 2014-03-12 DIAGNOSIS — R112 Nausea with vomiting, unspecified: Secondary | ICD-10-CM | POA: Diagnosis not present

## 2014-03-12 DIAGNOSIS — R141 Gas pain: Secondary | ICD-10-CM | POA: Diagnosis not present

## 2014-03-12 DIAGNOSIS — A088 Other specified intestinal infections: Secondary | ICD-10-CM | POA: Diagnosis present

## 2014-03-12 DIAGNOSIS — K2990 Gastroduodenitis, unspecified, without bleeding: Secondary | ICD-10-CM | POA: Diagnosis present

## 2014-03-12 DIAGNOSIS — R1110 Vomiting, unspecified: Secondary | ICD-10-CM | POA: Diagnosis not present

## 2014-03-12 DIAGNOSIS — M899 Disorder of bone, unspecified: Secondary | ICD-10-CM | POA: Diagnosis present

## 2014-03-12 DIAGNOSIS — I5030 Unspecified diastolic (congestive) heart failure: Secondary | ICD-10-CM | POA: Diagnosis present

## 2014-03-12 DIAGNOSIS — I120 Hypertensive chronic kidney disease with stage 5 chronic kidney disease or end stage renal disease: Secondary | ICD-10-CM | POA: Diagnosis present

## 2014-03-12 DIAGNOSIS — Z9861 Coronary angioplasty status: Secondary | ICD-10-CM

## 2014-03-12 DIAGNOSIS — R627 Adult failure to thrive: Secondary | ICD-10-CM | POA: Diagnosis present

## 2014-03-12 DIAGNOSIS — I10 Essential (primary) hypertension: Secondary | ICD-10-CM | POA: Diagnosis present

## 2014-03-12 DIAGNOSIS — Z7982 Long term (current) use of aspirin: Secondary | ICD-10-CM | POA: Diagnosis not present

## 2014-03-12 DIAGNOSIS — I509 Heart failure, unspecified: Secondary | ICD-10-CM | POA: Diagnosis present

## 2014-03-12 DIAGNOSIS — R6889 Other general symptoms and signs: Secondary | ICD-10-CM | POA: Diagnosis not present

## 2014-03-12 DIAGNOSIS — R1084 Generalized abdominal pain: Secondary | ICD-10-CM | POA: Diagnosis not present

## 2014-03-12 HISTORY — DX: Candidal esophagitis: B37.81

## 2014-03-12 HISTORY — DX: Gastritis, unspecified, without bleeding: K29.70

## 2014-03-12 LAB — COMPREHENSIVE METABOLIC PANEL
ALT: 7 U/L (ref 0–35)
ANION GAP: 16 — AB (ref 5–15)
AST: 19 U/L (ref 0–37)
Albumin: 3.5 g/dL (ref 3.5–5.2)
Alkaline Phosphatase: 137 U/L — ABNORMAL HIGH (ref 39–117)
BILIRUBIN TOTAL: 0.6 mg/dL (ref 0.3–1.2)
BUN: 19 mg/dL (ref 6–23)
CALCIUM: 9.6 mg/dL (ref 8.4–10.5)
CHLORIDE: 103 meq/L (ref 96–112)
CO2: 20 meq/L (ref 19–32)
CREATININE: 1.01 mg/dL (ref 0.50–1.10)
GFR, EST AFRICAN AMERICAN: 59 mL/min — AB (ref >90)
GFR, EST NON AFRICAN AMERICAN: 51 mL/min — AB (ref >90)
Glucose, Bld: 138 mg/dL — ABNORMAL HIGH (ref 70–99)
Potassium: 4 mEq/L (ref 3.7–5.3)
Sodium: 139 mEq/L (ref 137–147)
Total Protein: 7.1 g/dL (ref 6.0–8.3)

## 2014-03-12 LAB — CBC WITH DIFFERENTIAL/PLATELET
Basophils Absolute: 0 10*3/uL (ref 0.0–0.1)
Basophils Relative: 0 % (ref 0–1)
EOS PCT: 0 % (ref 0–5)
Eosinophils Absolute: 0 10*3/uL (ref 0.0–0.7)
HEMATOCRIT: 36.1 % (ref 36.0–46.0)
HEMOGLOBIN: 11.7 g/dL — AB (ref 12.0–15.0)
LYMPHS PCT: 9 % — AB (ref 12–46)
Lymphs Abs: 0.8 10*3/uL (ref 0.7–4.0)
MCH: 28.7 pg (ref 26.0–34.0)
MCHC: 32.4 g/dL (ref 30.0–36.0)
MCV: 88.7 fL (ref 78.0–100.0)
MONO ABS: 0.3 10*3/uL (ref 0.1–1.0)
MONOS PCT: 4 % (ref 3–12)
Neutro Abs: 7.1 10*3/uL (ref 1.7–7.7)
Neutrophils Relative %: 87 % — ABNORMAL HIGH (ref 43–77)
Platelets: 227 10*3/uL (ref 150–400)
RBC: 4.07 MIL/uL (ref 3.87–5.11)
RDW: 13 % (ref 11.5–15.5)
WBC: 8.2 10*3/uL (ref 4.0–10.5)

## 2014-03-12 LAB — URINALYSIS, ROUTINE W REFLEX MICROSCOPIC
Bilirubin Urine: NEGATIVE
GLUCOSE, UA: NEGATIVE mg/dL
KETONES UR: 15 mg/dL — AB
LEUKOCYTES UA: NEGATIVE
NITRITE: NEGATIVE
Protein, ur: 30 mg/dL — AB
Specific Gravity, Urine: 1.015 (ref 1.005–1.030)
UROBILINOGEN UA: 0.2 mg/dL (ref 0.0–1.0)
pH: 7 (ref 5.0–8.0)

## 2014-03-12 LAB — URINE MICROSCOPIC-ADD ON

## 2014-03-12 LAB — LIPASE, BLOOD: LIPASE: 19 U/L (ref 11–59)

## 2014-03-12 LAB — TROPONIN I: Troponin I: <0.3 ng/mL (ref <0.30)

## 2014-03-12 LAB — I-STAT CG4 LACTIC ACID, ED: Lactic Acid, Venous: 1.83 mmol/L (ref 0.5–2.2)

## 2014-03-12 MED ORDER — PROMETHAZINE HCL 25 MG RE SUPP
12.5000 mg | Freq: Four times a day (QID) | RECTAL | Status: DC | PRN
  Administered 2014-03-12: 12.5 mg via RECTAL
  Filled 2014-03-12: qty 1

## 2014-03-12 MED ORDER — SODIUM CHLORIDE 0.9 % IV BOLUS (SEPSIS)
500.0000 mL | Freq: Once | INTRAVENOUS | Status: AC
  Administered 2014-03-12: 500 mL via INTRAVENOUS

## 2014-03-12 MED ORDER — FENTANYL CITRATE 0.05 MG/ML IJ SOLN
50.0000 ug | Freq: Once | INTRAMUSCULAR | Status: AC
  Administered 2014-03-12: 50 ug via INTRAVENOUS
  Filled 2014-03-12: qty 2

## 2014-03-12 MED ORDER — ONDANSETRON HCL 4 MG/2ML IJ SOLN
4.0000 mg | Freq: Once | INTRAMUSCULAR | Status: AC
  Administered 2014-03-12: 4 mg via INTRAVENOUS
  Filled 2014-03-12: qty 2

## 2014-03-12 MED ORDER — OXYCODONE-ACETAMINOPHEN 5-325 MG PO TABS
1.0000 | ORAL_TABLET | Freq: Once | ORAL | Status: AC
  Administered 2014-03-12: 1 via ORAL
  Filled 2014-03-12: qty 1

## 2014-03-12 NOTE — ED Notes (Signed)
Pt still vomiting at present, waiting on Phenergan from pharmacy.

## 2014-03-12 NOTE — ED Notes (Addendum)
Pt informed that she still needs to provide urine sample, pt states she will urinate.

## 2014-03-12 NOTE — ED Provider Notes (Signed)
CSN: [id number]     Arrival date & time 03/12/14  1443 History   First MD Initiated Contact with Patient 03/12/14 1455     Chief Complaint  Patient presents with  . Emesis     (Consider location/radiation/quality/duration/timing/severity/associated sxs/prior Treatment) Patient is a 80 y.o. female presenting with vomiting.  Emesis Severity:  Severe Duration:  1 day Timing:  Constant Quality:  Stomach contents Progression:  Worsening Chronicity:  New Relieved by:  Nothing Ineffective treatments: zofran IV x 2 doses. Associated symptoms: abdominal pain, chills and diarrhea   Associated symptoms: no fever   Associated symptoms comment:  Diaphoresis Abdominal pain:    Location:  Generalized   Severity:  Moderate   Timing:  Constant   Past Medical History  Diagnosis Date  . S/P kidney transplant 2003    due to FSGN, follows with Dr. Briant CedarMattingly  . Coronary artery disease   . Hypertension   . Cholelithiasis   . Diastolic CHF   . Gout     unconfirmed by joint aspiration  . Chronic kidney disease    Past Surgical History  Procedure Laterality Date  . Kidney transplant    . Abdominal hysterectomy     No family history on file. History  Substance Use Topics  . Smoking status: Former Smoker    Quit date: 04/19/1969  . Smokeless tobacco: Never Used  . Alcohol Use: No   OB History   Grav Para Term Preterm Abortions TAB SAB Ect Mult Living                 Review of Systems  Constitutional: Positive for chills.  Gastrointestinal: Positive for vomiting, abdominal pain and diarrhea.  All other systems reviewed and are negative.     Allergies  Sulfa drugs cross reactors  Home Medications   Prior to Admission medications   Medication Sig Start Date End Date Taking? Authorizing Provider  acetaminophen (TYLENOL) 325 MG tablet Take 650 mg by mouth every 6 (six) hours as needed. For pain    Historical Provider, MD  allopurinol (ZYLOPRIM) 100 MG tablet Take 100 mg by  mouth at bedtime.    Historical Provider, MD  amLODipine (NORVASC) 10 MG tablet Take 10 mg by mouth Daily. 04/28/12   Historical Provider, MD  aspirin EC 81 MG tablet Take 81 mg by mouth daily.    Historical Provider, MD  cinacalcet (SENSIPAR) 30 MG tablet Take 30 mg by mouth every other day.     Historical Provider, MD  colchicine 0.6 MG tablet Take 1 tablet (0.6 mg total) by mouth daily. 04/26/12   Almyra DeforestJaseela Illath, MD  docusate sodium (COLACE) 100 MG capsule Take 100 mg by mouth 2 (two) times daily as needed. For constipation    Historical Provider, MD  famotidine (PEPCID) 20 MG tablet Take 20 mg by mouth 2 (two) times daily.    Historical Provider, MD  furosemide (LASIX) 80 MG tablet Take 80 mg by mouth 2 (two) times daily.     Historical Provider, MD  HYDROcodone-acetaminophen (NORCO/VICODIN) 5-325 MG per tablet 1-2 tablets po q 6 hours prn moderate to severe pain 06/29/12   Gavin PoundMichael Y. Ghim, MD  losartan (COZAAR) 50 MG tablet Take 1 tablet (50 mg total) by mouth daily. 07/11/13   Marrian SalvageJacquelyn S Gill, MD  metoprolol (LOPRESSOR) 50 MG tablet Take 1 tablet (50 mg total) by mouth 2 (two) times daily. 07/11/13   Marrian SalvageJacquelyn S Gill, MD  mycophenolate (CELLCEPT) 250 MG capsule Take 500 mg  by mouth 2 (two) times daily.    Historical Provider, MD  simvastatin (ZOCOR) 20 MG tablet Take 20 mg by mouth at bedtime.     Historical Provider, MD  tacrolimus (PROGRAF) 1 MG capsule Take 2 mg by mouth 2 (two) times daily.     Historical Provider, MD   BP 151/56  Temp(Src) 97.7 F (36.5 C) (Oral)  Resp 18  SpO2 100% Physical Exam  Nursing note and vitals reviewed. Constitutional: She is oriented to person, place, and time. She appears well-developed and well-nourished. She appears distressed (retching).  HENT:  Head: Normocephalic and atraumatic.  Mouth/Throat: Oropharynx is clear and moist.  Eyes: Conjunctivae are normal. Pupils are equal, round, and reactive to light. No scleral icterus.  Neck: Neck supple.   Cardiovascular: Normal rate, regular rhythm, normal heart sounds and intact distal pulses.   No murmur heard. Pulmonary/Chest: Effort normal and breath sounds normal. No stridor. No respiratory distress. She has no rales.  Abdominal: Soft. Bowel sounds are normal. She exhibits no distension. There is generalized tenderness. There is no rigidity, no rebound and no guarding.  Musculoskeletal: Normal range of motion. She exhibits no edema.  Neurological: She is alert and oriented to person, place, and time.  Skin: Skin is warm and dry. No rash noted.  Psychiatric: She has a normal mood and affect. Her behavior is normal.    ED Course  Procedures (including critical care time) Labs Review Labs Reviewed  CBC WITH DIFFERENTIAL - Abnormal; Notable for the following:    Hemoglobin 11.7 (*)    Neutrophils Relative % 87 (*)    Lymphocytes Relative 9 (*)    All other components within normal limits  COMPREHENSIVE METABOLIC PANEL - Abnormal; Notable for the following:    Glucose, Bld 138 (*)    Alkaline Phosphatase 137 (*)    GFR calc non Af Amer 51 (*)    GFR calc Af Amer 59 (*)    Anion gap 16 (*)    All other components within normal limits  URINALYSIS, ROUTINE W REFLEX MICROSCOPIC - Abnormal; Notable for the following:    Hgb urine dipstick TRACE (*)    Ketones, ur 15 (*)    Protein, ur 30 (*)    All other components within normal limits  CBC - Abnormal; Notable for the following:    Hemoglobin 11.7 (*)    All other components within normal limits  COMPREHENSIVE METABOLIC PANEL - Abnormal; Notable for the following:    Glucose, Bld 106 (*)    Albumin 3.3 (*)    Alkaline Phosphatase 127 (*)    GFR calc non Af Amer 47 (*)    GFR calc Af Amer 54 (*)    Anion gap 18 (*)    All other components within normal limits  LIPASE, BLOOD  TROPONIN I  URINE MICROSCOPIC-ADD ON  TROPONIN I  TROPONIN I  GI PATHOGEN PANEL BY PCR, STOOL  TACROLIMUS LEVEL  CMV (CYTOMEGALOVIRUS) DNA ULTRAQUANT,  PCR  I-STAT CG4 LACTIC ACID, ED    Imaging Review Ct Abdomen Pelvis Wo Contrast  03/12/2014   CLINICAL DATA:  Abdominal pain and vomiting and diarrhea; history of renal transplant in 2003  EXAM: CT ABDOMEN AND PELVIS WITHOUT CONTRAST  TECHNIQUE: Multidetector CT imaging of the abdomen and pelvis was performed following the standard protocol without IV contrast.  COMPARISON:  None.  FINDINGS: The gallbladder is mildly distended, exhibits no wall thickening, and contains tiny radiodense stones or sludge. The liver exhibits  no focal mass or ductal dilation. The spleen, partially distended stomach, pancreas, and adrenal glands are normal. There is bilateral renal atrophy. There are hypodensities associated with both kidneys compatible with cysts. There is a transplanted kidney in the right iliac fossa. There is no significant hydronephrosis. The caliber of the abdominal aorta is normal. The periaortic and pericaval regions are normal.  There is no small or large bowel obstructive pattern. There is no evidence of enteritis. There are scattered colonic diverticula especially in the sigmoid. There is no evidence of acute diverticulitis. The appendix is not discretely demonstrated but no pericecal abnormality is demonstrated. The urinary bladder is moderately distended and grossly normal. The uterus is surgically absent. There is a structure in the left groin which may be patent dialysis graft or pseudoaneurysm but I have no clinical history in this regard.  There are degenerative changes of the lumbar spine and both hips. The lung bases exhibit fibrotic changes but no focal pneumonia.  IMPRESSION: 1. There is no evidence of gastroenteritis or diverticulitis nor other acute bowel abnormality. 2. There are gallstones in the gallbladder is mildly distended, but there is no objective evidence of acute cholecystitis. 3. There is a transplanted kidney in the right iliac fossa. The native kidneys are atrophic and exhibit  multiple presumed cysts. 4. There is a rim calcified structure in the left groin which may reflect a pseudoaneurysm. There are adjacent Gortex graft-like structures present. Correlation with the patient's clinical history and clinical exam in this regard is needed.   Electronically Signed   By: David  [place]   On: 03/12/2014 20:48  All radiology studies independently viewed by me.       EKG Interpretation   Date/Time:  Tuesday March 12 2014 15:44:17 EDT Ventricular Rate:  50 PR Interval:  174 QRS Duration: 94 QT Interval:  492 QTC Calculation: 448 R Axis:   -29 Text Interpretation:  Sinus bradycardia with sinus arrhythmia Septal  infarct , age undetermined ST \\T \ T wave abnormality, consider lateral  ischemia Abnormal ECG nonspecific ST/T changes.  no priors.  Confirmed by  [facility]  MD, TREY ([id number]) on 03/13/2014 2:00:08 PM      MDM   Final diagnoses:  Vomiting and diarrhea  Generalized abdominal pain    80 yo female with vomiting and diarrhea.  Phenergan helped somewhat, but pt remained ill appearing during ED course.  Pt had generalized abdominal pain which she was unable to further localize or characterize.  Generalized abdominal TTP without focality.  CT obtained without contrast secondary to renal transplant, did not show clear cause of her abd pain.  Admitted by Internal Medicine.       Candyce Churn III, MD 03/13/14 1400

## 2014-03-12 NOTE — ED Notes (Signed)
Pt requesting something for pain. EDP made aware.  

## 2014-03-12 NOTE — ED Notes (Signed)
Attempted to call report x 1  

## 2014-03-12 NOTE — ED Notes (Signed)
Bedside commode set up at bedside for pt.

## 2014-03-12 NOTE — ED Notes (Addendum)
abd pain since yesterday and vomiting started this am has had diarrhea also got 4 mg zofran per ems  Has iv 18 rt ac 180/ 80 60 heart rate 18 resp has broken arm left that she broke in June kidney trasplant 2003

## 2014-03-13 ENCOUNTER — Encounter (HOSPITAL_COMMUNITY): Payer: Self-pay | Admitting: *Deleted

## 2014-03-13 ENCOUNTER — Observation Stay (HOSPITAL_COMMUNITY): Payer: Medicare Other

## 2014-03-13 DIAGNOSIS — Z79899 Other long term (current) drug therapy: Secondary | ICD-10-CM | POA: Diagnosis not present

## 2014-03-13 DIAGNOSIS — I10 Essential (primary) hypertension: Secondary | ICD-10-CM

## 2014-03-13 DIAGNOSIS — N186 End stage renal disease: Secondary | ICD-10-CM | POA: Diagnosis not present

## 2014-03-13 DIAGNOSIS — Z7982 Long term (current) use of aspirin: Secondary | ICD-10-CM | POA: Diagnosis not present

## 2014-03-13 DIAGNOSIS — M949 Disorder of cartilage, unspecified: Secondary | ICD-10-CM | POA: Diagnosis present

## 2014-03-13 DIAGNOSIS — N2581 Secondary hyperparathyroidism of renal origin: Secondary | ICD-10-CM | POA: Diagnosis not present

## 2014-03-13 DIAGNOSIS — E861 Hypovolemia: Secondary | ICD-10-CM | POA: Diagnosis not present

## 2014-03-13 DIAGNOSIS — R112 Nausea with vomiting, unspecified: Secondary | ICD-10-CM

## 2014-03-13 DIAGNOSIS — K5900 Constipation, unspecified: Secondary | ICD-10-CM | POA: Diagnosis present

## 2014-03-13 DIAGNOSIS — N032 Chronic nephritic syndrome with diffuse membranous glomerulonephritis: Secondary | ICD-10-CM

## 2014-03-13 DIAGNOSIS — M109 Gout, unspecified: Secondary | ICD-10-CM

## 2014-03-13 DIAGNOSIS — R110 Nausea: Secondary | ICD-10-CM | POA: Diagnosis not present

## 2014-03-13 DIAGNOSIS — I2510 Atherosclerotic heart disease of native coronary artery without angina pectoris: Secondary | ICD-10-CM

## 2014-03-13 DIAGNOSIS — R141 Gas pain: Secondary | ICD-10-CM | POA: Diagnosis not present

## 2014-03-13 DIAGNOSIS — R1013 Epigastric pain: Secondary | ICD-10-CM | POA: Diagnosis not present

## 2014-03-13 DIAGNOSIS — I509 Heart failure, unspecified: Secondary | ICD-10-CM

## 2014-03-13 DIAGNOSIS — Z940 Kidney transplant status: Secondary | ICD-10-CM | POA: Diagnosis not present

## 2014-03-13 DIAGNOSIS — Z87891 Personal history of nicotine dependence: Secondary | ICD-10-CM | POA: Diagnosis not present

## 2014-03-13 DIAGNOSIS — I5032 Chronic diastolic (congestive) heart failure: Secondary | ICD-10-CM | POA: Diagnosis not present

## 2014-03-13 DIAGNOSIS — I120 Hypertensive chronic kidney disease with stage 5 chronic kidney disease or end stage renal disease: Secondary | ICD-10-CM | POA: Diagnosis not present

## 2014-03-13 DIAGNOSIS — R6881 Early satiety: Secondary | ICD-10-CM | POA: Diagnosis not present

## 2014-03-13 DIAGNOSIS — K8020 Calculus of gallbladder without cholecystitis without obstruction: Secondary | ICD-10-CM | POA: Diagnosis not present

## 2014-03-13 DIAGNOSIS — R197 Diarrhea, unspecified: Secondary | ICD-10-CM | POA: Diagnosis not present

## 2014-03-13 DIAGNOSIS — N19 Unspecified kidney failure: Secondary | ICD-10-CM | POA: Diagnosis not present

## 2014-03-13 DIAGNOSIS — R627 Adult failure to thrive: Secondary | ICD-10-CM | POA: Diagnosis present

## 2014-03-13 DIAGNOSIS — D649 Anemia, unspecified: Secondary | ICD-10-CM | POA: Diagnosis present

## 2014-03-13 DIAGNOSIS — K2970 Gastritis, unspecified, without bleeding: Secondary | ICD-10-CM | POA: Diagnosis not present

## 2014-03-13 DIAGNOSIS — M899 Disorder of bone, unspecified: Secondary | ICD-10-CM | POA: Diagnosis present

## 2014-03-13 DIAGNOSIS — R109 Unspecified abdominal pain: Secondary | ICD-10-CM | POA: Diagnosis present

## 2014-03-13 DIAGNOSIS — A088 Other specified intestinal infections: Secondary | ICD-10-CM | POA: Diagnosis not present

## 2014-03-13 DIAGNOSIS — R143 Flatulence: Secondary | ICD-10-CM | POA: Diagnosis not present

## 2014-03-13 DIAGNOSIS — B3781 Candidal esophagitis: Secondary | ICD-10-CM | POA: Diagnosis not present

## 2014-03-13 DIAGNOSIS — I5030 Unspecified diastolic (congestive) heart failure: Secondary | ICD-10-CM | POA: Diagnosis not present

## 2014-03-13 LAB — CBC
HCT: 36.5 % (ref 36.0–46.0)
HEMOGLOBIN: 11.7 g/dL — AB (ref 12.0–15.0)
MCH: 29.3 pg (ref 26.0–34.0)
MCHC: 32.1 g/dL (ref 30.0–36.0)
MCV: 91.3 fL (ref 78.0–100.0)
Platelets: 226 10*3/uL (ref 150–400)
RBC: 4 MIL/uL (ref 3.87–5.11)
RDW: 13.4 % (ref 11.5–15.5)
WBC: 7.4 10*3/uL (ref 4.0–10.5)

## 2014-03-13 LAB — COMPREHENSIVE METABOLIC PANEL
ALK PHOS: 127 U/L — AB (ref 39–117)
ALT: 5 U/L (ref 0–35)
ANION GAP: 18 — AB (ref 5–15)
AST: 16 U/L (ref 0–37)
Albumin: 3.3 g/dL — ABNORMAL LOW (ref 3.5–5.2)
BUN: 19 mg/dL (ref 6–23)
CALCIUM: 9.6 mg/dL (ref 8.4–10.5)
CO2: 19 meq/L (ref 19–32)
Chloride: 103 mEq/L (ref 96–112)
Creatinine, Ser: 1.09 mg/dL (ref 0.50–1.10)
GFR, EST AFRICAN AMERICAN: 54 mL/min — AB (ref >90)
GFR, EST NON AFRICAN AMERICAN: 47 mL/min — AB (ref >90)
GLUCOSE: 106 mg/dL — AB (ref 70–99)
Potassium: 4.1 mEq/L (ref 3.7–5.3)
SODIUM: 140 meq/L (ref 137–147)
Total Bilirubin: 0.5 mg/dL (ref 0.3–1.2)
Total Protein: 6.7 g/dL (ref 6.0–8.3)

## 2014-03-13 LAB — TROPONIN I
Troponin I: <0.3 ng/mL (ref <0.30)
Troponin I: <0.3 ng/mL (ref <0.30)

## 2014-03-13 MED ORDER — PROMETHAZINE HCL 25 MG/ML IJ SOLN
12.5000 mg | Freq: Four times a day (QID) | INTRAMUSCULAR | Status: DC | PRN
  Administered 2014-03-13: 12.5 mg via INTRAVENOUS
  Filled 2014-03-13: qty 1

## 2014-03-13 MED ORDER — ONDANSETRON HCL 4 MG/2ML IJ SOLN
4.0000 mg | Freq: Four times a day (QID) | INTRAMUSCULAR | Status: DC | PRN
  Administered 2014-03-13 – 2014-03-19 (×5): 4 mg via INTRAVENOUS
  Filled 2014-03-13 (×4): qty 2

## 2014-03-13 MED ORDER — TACROLIMUS 1 MG PO CAPS
2.5000 mg | ORAL_CAPSULE | Freq: Once | ORAL | Status: AC
  Administered 2014-03-13: 2.5 mg via ORAL
  Filled 2014-03-13: qty 1

## 2014-03-13 MED ORDER — MYCOPHENOLATE MOFETIL 250 MG PO CAPS
500.0000 mg | ORAL_CAPSULE | Freq: Once | ORAL | Status: AC
  Administered 2014-03-13: 500 mg via ORAL
  Filled 2014-03-13 (×2): qty 2

## 2014-03-13 MED ORDER — MYCOPHENOLATE MOFETIL 250 MG PO CAPS
500.0000 mg | ORAL_CAPSULE | Freq: Two times a day (BID) | ORAL | Status: DC
  Administered 2014-03-13 – 2014-03-21 (×16): 500 mg via ORAL
  Filled 2014-03-13 (×17): qty 2

## 2014-03-13 MED ORDER — GI COCKTAIL ~~LOC~~
30.0000 mL | Freq: Two times a day (BID) | ORAL | Status: DC | PRN
  Filled 2014-03-13: qty 30

## 2014-03-13 MED ORDER — ACETAMINOPHEN 325 MG PO TABS
650.0000 mg | ORAL_TABLET | Freq: Once | ORAL | Status: AC
  Administered 2014-03-13: 650 mg via ORAL
  Filled 2014-03-13: qty 2

## 2014-03-13 MED ORDER — PROMETHAZINE HCL 25 MG/ML IJ SOLN
12.5000 mg | Freq: Four times a day (QID) | INTRAMUSCULAR | Status: DC
  Administered 2014-03-13 – 2014-03-15 (×8): 12.5 mg via INTRAVENOUS
  Filled 2014-03-13 (×13): qty 1

## 2014-03-13 MED ORDER — MECLIZINE HCL 12.5 MG PO TABS
12.5000 mg | ORAL_TABLET | Freq: Two times a day (BID) | ORAL | Status: DC | PRN
  Administered 2014-03-17: 12.5 mg via ORAL
  Filled 2014-03-13: qty 1

## 2014-03-13 MED ORDER — SODIUM CHLORIDE 0.9 % IV SOLN
INTRAVENOUS | Status: DC
  Administered 2014-03-13 (×2): via INTRAVENOUS

## 2014-03-13 MED ORDER — TACROLIMUS 0.5 MG PO CAPS
2.5000 mg | ORAL_CAPSULE | Freq: Every day | ORAL | Status: DC
  Administered 2014-03-14 – 2014-03-20 (×7): 2.5 mg via ORAL
  Filled 2014-03-13 (×8): qty 5

## 2014-03-13 MED ORDER — SODIUM CHLORIDE 0.9 % IJ SOLN
3.0000 mL | Freq: Two times a day (BID) | INTRAMUSCULAR | Status: DC
  Administered 2014-03-13 – 2014-03-20 (×12): 3 mL via INTRAVENOUS

## 2014-03-13 MED ORDER — HEPARIN SODIUM (PORCINE) 5000 UNIT/ML IJ SOLN
5000.0000 [IU] | Freq: Three times a day (TID) | INTRAMUSCULAR | Status: DC
  Administered 2014-03-13 – 2014-03-21 (×22): 5000 [IU] via SUBCUTANEOUS
  Filled 2014-03-13 (×27): qty 1

## 2014-03-13 MED ORDER — TACROLIMUS 1 MG PO CAPS
2.0000 mg | ORAL_CAPSULE | Freq: Every day | ORAL | Status: DC
  Administered 2014-03-13 – 2014-03-20 (×8): 2 mg via ORAL
  Filled 2014-03-13 (×9): qty 2

## 2014-03-13 MED ORDER — ONDANSETRON HCL 4 MG/2ML IJ SOLN
INTRAMUSCULAR | Status: AC
  Administered 2014-03-13: 4 mg via INTRAVENOUS
  Filled 2014-03-13: qty 2

## 2014-03-13 NOTE — Progress Notes (Addendum)
Subjective:  Patient was seen and examined this morning. Patient states that 4 days ago she developed abdominal cramping and pain. Yesterday, she developed diarrhea, nausea and vomiting. She has not vomited or had diarrhea since yesterday. She continues to have severe nausea and dry heaving. Her abdominal pain is severe 9/10 and diffuse. She denies any fevers or chills, hematemesis, hematochezia or melena. Patient has never had a colonoscopy.  Objective: Vital signs in last 24 hours: Filed Vitals:   03/13/14 0015 03/13/14 0050 03/13/14 0501 03/13/14 0925  BP: 138/51 179/59 158/66 146/60  Pulse: 53 61 80 58  Temp:  98.1 F (36.7 C) 98.9 F (37.2 C) 98.4 F (36.9 C)  TempSrc:    Oral  Resp: 17 18 17 18   Height:  5\' 2"  (1.575 m)    Weight:  73.936 kg (163 lb)    SpO2: 100% 96% 97% 98%   Weight change:   Intake/Output Summary (Last 24 hours) at 03/13/14 1618 Last data filed at 03/13/14 0900  Gross per 24 hour  Intake      0 ml  Output      0 ml  Net      0 ml   General: Vital signs reviewed.  Patient is well-developed and well-nourished, in mild distress but cooperative with exam. Sitting up on the side of the bed, leaning forward with frequent dry heaves. Cardiovascular: RRR, S1 normal, S2 normal, no murmurs, gallops, or rubs. Pulmonary/Chest: Clear to auscultation bilaterally, no wheezes, rales, or rhonchi. Abdominal: Soft, tender to palpation in the right upper quadrant and rid mid quadrant, positive murphy's sign with inspiratory arrest, non-distended, decreased BS, + mass or right side of abdomen, most likely from kidney transplant underneath her scar, no organomegaly, no guarding present.  Musculoskeletal: No joint deformities, erythema, or stiffness, ROM full and nontender. Extremities: No lower extremity edema bilaterally,  pulses symmetric and intact bilaterally. No cyanosis or clubbing. Neurological: A&O x3, Strength is normal and symmetric bilaterally, no focal motor  deficit, sensory intact to light touch bilaterally.  Skin: Warm, dry and intact. No rashes or erythema. Psychiatric: Normal mood and affect. speech and behavior is normal. Cognition and memory are normal.    Lab Results: Basic Metabolic Panel:  Recent Labs Lab 03/12/14 1539 03/13/14 0651  NA 139 140  K 4.0 4.1  CL 103 103  CO2 20 19  GLUCOSE 138* 106*  BUN 19 19  CREATININE 1.01 1.09  CALCIUM 9.6 9.6   Liver Function Tests:  Recent Labs Lab 03/12/14 1539 03/13/14 0651  AST 19 16  ALT 7 5  ALKPHOS 137* 127*  BILITOT 0.6 0.5  PROT 7.1 6.7  ALBUMIN 3.5 3.3*    Recent Labs Lab 03/12/14 1539  LIPASE 19   CBC:  Recent Labs Lab 03/12/14 1539 03/13/14 0651  WBC 8.2 7.4  NEUTROABS 7.1  --   HGB 11.7* 11.7*  HCT 36.1 36.5  MCV 88.7 91.3  PLT 227 226   Cardiac Enzymes:  Recent Labs Lab 03/12/14 1539 03/12/14 2355 03/13/14 0651  TROPONINI <0.30 <0.30 <0.30   Urinalysis:  Recent Labs Lab 03/12/14 2120  COLORURINE YELLOW  LABSPEC 1.015  PHURINE 7.0  GLUCOSEU NEGATIVE  HGBUR TRACE*  BILIRUBINUR NEGATIVE  KETONESUR 15*  PROTEINUR 30*  UROBILINOGEN 0.2  NITRITE NEGATIVE  LEUKOCYTESUR NEGATIVE   Studies/Results: Ct Abdomen Pelvis Wo Contrast  03/12/2014   CLINICAL DATA:  Abdominal pain and vomiting and diarrhea; history of renal transplant in 2003  EXAM: CT  ABDOMEN AND PELVIS WITHOUT CONTRAST  TECHNIQUE: Multidetector CT imaging of the abdomen and pelvis was performed following the standard protocol without IV contrast.  COMPARISON:  None.  FINDINGS: The gallbladder is mildly distended, exhibits no wall thickening, and contains tiny radiodense stones or sludge. The liver exhibits no focal mass or ductal dilation. The spleen, partially distended stomach, pancreas, and adrenal glands are normal. There is bilateral renal atrophy. There are hypodensities associated with both kidneys compatible with cysts. There is a transplanted kidney in the right iliac  fossa. There is no significant hydronephrosis. The caliber of the abdominal aorta is normal. The periaortic and pericaval regions are normal.  There is no small or large bowel obstructive pattern. There is no evidence of enteritis. There are scattered colonic diverticula especially in the sigmoid. There is no evidence of acute diverticulitis. The appendix is not discretely demonstrated but no pericecal abnormality is demonstrated. The urinary bladder is moderately distended and grossly normal. The uterus is surgically absent. There is a structure in the left groin which may be patent dialysis graft or pseudoaneurysm but I have no clinical history in this regard.  There are degenerative changes of the lumbar spine and both hips. The lung bases exhibit fibrotic changes but no focal pneumonia.  IMPRESSION: 1. There is no evidence of gastroenteritis or diverticulitis nor other acute bowel abnormality. 2. There are gallstones in the gallbladder is mildly distended, but there is no objective evidence of acute cholecystitis. 3. There is a transplanted kidney in the right iliac fossa. The native kidneys are atrophic and exhibit multiple presumed cysts. 4. There is a rim calcified structure in the left groin which may reflect a pseudoaneurysm. There are adjacent Gortex graft-like structures present. Correlation with the patient's clinical history and clinical exam in this regard is needed.   Electronically Signed   By: David  [place]   On: 03/12/2014 20:48   US Abdomen Limited Ruq  03/13/2014   CLINICAL DATA:  Nausea and vomiting, history of renal transplant and gallstones  EXAM: US ABDOMEN LIMITED - RIGHT UPPER QUADRANT  COMPARISON:  None.  FINDINGS: Gallbladder:  There are numerous gallstones the largest measuring 2.2 cm. Gallbladder wall is not thickened and there is no Murphy's sign.  Common bile duct:  Diameter: 5 mm  Liver:  No focal lesion identified. Within normal limits in parenchymal echogenicity.  IMPRESSION:  Cholelithiasis   Electronically Signed   By: Esperanza Heir [name]   On: 03/13/2014 15:52   Medications: I have reviewed the patient's current medications.  Prescriptions prior to admission  Medication Sig Dispense Refill  . acetaminophen (TYLENOL) 500 MG tablet Take 500 mg by mouth every 6 (six) hours as needed for mild pain.      Marland Kitchen allopurinol (ZYLOPRIM) 100 MG tablet Take 100 mg by mouth at bedtime.      Marland Kitchen amLODipine (NORVASC) 10 MG tablet Take 5 mg by mouth Daily.       Marland Kitchen aspirin EC 81 MG tablet Take 81 mg by mouth daily.      . cinacalcet (SENSIPAR) 90 MG tablet Take 90 mg by mouth daily.      . colchicine 0.6 MG tablet Take 0.6 mg by mouth daily as needed. For gout      . docusate sodium (COLACE) 100 MG capsule Take 100 mg by mouth 2 (two) times daily as needed. For constipation      . famotidine (PEPCID) 20 MG tablet Take 20 mg by mouth 2 (two) times  daily.      . furosemide (LASIX) 80 MG tablet Take 80 mg by mouth 2 (two) times daily.       Marland Kitchen. losartan (COZAAR) 100 MG tablet Take 100 mg by mouth daily.      . metoprolol (LOPRESSOR) 50 MG tablet Take 100 mg by mouth 2 (two) times daily.      . mycophenolate (CELLCEPT) 250 MG capsule Take 500 mg by mouth 2 (two) times daily.      . simvastatin (ZOCOR) 20 MG tablet Take 20 mg by mouth at bedtime.       . tacrolimus (PROGRAF) 0.5 MG capsule Take 0.5 mg by mouth daily. Take 1 capsule by mouth every morning along with the 1mg  capsules for a total dose of 2.5mg  every morning and 2mg       . tacrolimus (PROGRAF) 1 MG capsule Take 2 mg by mouth 2 (two) times daily.         Scheduled Meds: . heparin  5,000 Units Subcutaneous 3 times per day  . mycophenolate  500 mg Oral BID  . sodium chloride  3 mL Intravenous Q12H  . tacrolimus  2 mg Oral QHS  . [START ON 03/14/2014] tacrolimus  2.5 mg Oral Daily   Continuous Infusions:   PRN Meds:.gi cocktail, ondansetron (ZOFRAN) IV, promethazine Assessment/Plan:  Abdominal pain, N/V: CT  abdomen/pelvis with no evidence of gastroenteritis, diverticulitis or acute bowel abnormality. +Gallstones but no acute cholecystitis. Urinalysis negative for leukocytes or nitrites. Differential includes cholecystitis, viral or bacterial gastroenteritis. Lactic acid normal. BMP continues to be unremarkable. Discussed CT findings with CT abdomen/pelvis with radiologist who suggested no evidence of cholecystitis, but recommended RUQ US. RUQ US showed there are numerous gallstones the largest measuring 2.2 cm. Gallbladder wall is not thickened and there is no Murphy's sign. Of note, patient has never had a colonoscopy. It would be important to set patient up with an outpatient GI consult after discharge.  -NPO  -phenergan 12.5 mg IV Q6H prn nausea  -CMV, stool pathogen panel pending -Fecal lactoferrin -C Dif PCR -Stool culture -Gi cocktail prn   Diastolic CHF, CAD: Troponins negative x 3. EKG with new slight ST depression in I, II, III, avf, and slight ST elevation in V1, V2, V3. Does not appear to be in acute exacerbation as she appears euvolemic on exam. Repeat EKG is similar to prior. -ASA 81 held  -losartan 100mg  held  -metoprolol 50mg  BID held  -furosemide 80mg  BID held  -simvastatin 20mg  held   HTN: Chronic, stable. SBP 140-160s. -amlodpine 10mg  held  -losartan 100mg  held  -metoprolol 50mg  BID held  -furosemide 80mg  BID held   Focal sclerosing glomerulonephritis s/p kidney transplant in 2003: on Cellcept and tacro. I have formally consulted nephrology who has agreed to see the patient. Their level of suspicion for Tacrolimus toxicity is low, and they recommend restarting her Tacrolimus and Cellcept. -Tacrolimus level pending -Continue Cellcept and Tacrolimus -Nephrology has been consulted  Gout: chronic, stable  -allopurinol 100mg  held  -cochicine 0.6mg  held   FEN: NPO   DVT ppx: subq hep 5000u TID  Dispo: Disposition is deferred at this time, awaiting improvement of current  medical problems.  Anticipated discharge in approximately 1-2 day(s).   The patient does not have a current PCP (No primary provider on file.) and [facility].  The patient does not have transportation limitations that hinder transportation to clinic appointments.  .Services Needed at time of discharge:  Y = Yes, Blank = No PT:   OT:   RN:   Equipment:   Other:     LOS: 1 day   Jill Alexanders, DO PGY-1 Internal Medicine Resident Pager # [phone] 03/13/2014 4:18 PM

## 2014-03-13 NOTE — H&P (Signed)
Internal Medicine Attending Admission Note Date: 03/13/2014  Patient name: [name] Medical record number: [id number] Date of birth: [birth year] Age: 80 y.o. Gender: female  I saw and evaluated the patient. I reviewed the resident's note and I agree with the resident's findings and plan as documented in the resident's note, with the following additional comments.  Chief Complaint(s): Abdominal pain, nausea, vomiting, and diarrhea  History - key components related to admission: Patient is an 80 year old woman with history of end-stage renal disease status post renal transplant in 2003, diastolic congestive heart failure, coronary artery disease, hypertension, and other problems as outlined in medical history, admitted with complaint of abdominal pain with nausea, vomiting, and diarrhea.  Abdominal discomfort began around 3 days ago, but progressed to vomiting and diarrhea on the day of admission.  She denies any fever, chest pain, shortness of breath, hematemesis, bright blood per rectum, or melena.   Physical Exam - key components related to admission:  Filed Vitals:   03/13/14 0015 03/13/14 0050 03/13/14 0501 03/13/14 0925  BP: 138/51 179/59 158/66 146/60  Pulse: 53 61 80 58  Temp:  98.1 F (36.7 C) 98.9 F (37.2 C) 98.4 F (36.9 C)  TempSrc:    Oral  Resp: 17 18 17 18   Height:  5\' 2"  (1.575 m)    Weight:  163 lb (73.936 kg)    SpO2: 100% 96% 97% 98%    General: Alert, ill appearing Lungs: Clear Heart: Regular; 2/6 systolic murmur Abdomen: Bowel sounds present, soft; moderate right abdominal tenderness Extremities: No edema  Lab results:   Basic Metabolic Panel:  Recent Labs  19/14/78 1539 03/13/14 0651  NA 139 140  K 4.0 4.1  CL 103 103  CO2 20 19  GLUCOSE 138* 106*  BUN 19 19  CREATININE 1.01 1.09  CALCIUM 9.6 9.6    Liver Function Tests:  Recent Labs  03/12/14 1539 03/13/14 0651  AST 19 16  ALT 7 5  ALKPHOS 137* 127*  BILITOT 0.6 0.5  PROT 7.1  6.7  ALBUMIN 3.5 3.3*    Recent Labs  03/12/14 1539  LIPASE 19    CBC:  Recent Labs  03/12/14 1539 03/13/14 0651  WBC 8.2 7.4  HGB 11.7* 11.7*  HCT 36.1 36.5  MCV 88.7 91.3  PLT 227 226    Recent Labs  03/12/14 1539  NEUTROABS 7.1  LYMPHSABS 0.8  MONOABS 0.3  EOSABS 0.0  BASOSABS 0.0    Cardiac Enzymes:  Recent Labs  03/12/14 1539 03/12/14 2355 03/13/14 0651  TROPONINI <0.30 <0.30 <0.30     Urinalysis    Component Value Date/Time   COLORURINE YELLOW 03/12/2014 2120   APPEARANCEUR CLEAR 03/12/2014 2120   LABSPEC 1.015 03/12/2014 2120   PHURINE 7.0 03/12/2014 2120   GLUCOSEU NEGATIVE 03/12/2014 2120   HGBUR TRACE* 03/12/2014 2120   BILIRUBINUR NEGATIVE 03/12/2014 2120   KETONESUR 15* 03/12/2014 2120   PROTEINUR 30* 03/12/2014 2120   UROBILINOGEN 0.2 03/12/2014 2120   NITRITE NEGATIVE 03/12/2014 2120   LEUKOCYTESUR NEGATIVE 03/12/2014 2120    Urine microscopic:  Recent Labs  03/12/14 2120  EPIU RARE  RBCU 0-2  BACTERIA RARE     Imaging results:  Ct Abdomen Pelvis Wo Contrast  03/12/2014   CLINICAL DATA:  Abdominal pain and vomiting and diarrhea; history of renal transplant in 2003  EXAM: CT ABDOMEN AND PELVIS WITHOUT CONTRAST  TECHNIQUE: Multidetector CT imaging of the abdomen and pelvis was performed following the standard protocol without IV  contrast.  COMPARISON:  None.  FINDINGS: The gallbladder is mildly distended, exhibits no wall thickening, and contains tiny radiodense stones or sludge. The liver exhibits no focal mass or ductal dilation. The spleen, partially distended stomach, pancreas, and adrenal glands are normal. There is bilateral renal atrophy. There are hypodensities associated with both kidneys compatible with cysts. There is a transplanted kidney in the right iliac fossa. There is no significant hydronephrosis. The caliber of the abdominal aorta is normal. The periaortic and pericaval regions are normal.  There is no small or large bowel  obstructive pattern. There is no evidence of enteritis. There are scattered colonic diverticula especially in the sigmoid. There is no evidence of acute diverticulitis. The appendix is not discretely demonstrated but no pericecal abnormality is demonstrated. The urinary bladder is moderately distended and grossly normal. The uterus is surgically absent. There is a structure in the left groin which may be patent dialysis graft or pseudoaneurysm but I have no clinical history in this regard.  There are degenerative changes of the lumbar spine and both hips. The lung bases exhibit fibrotic changes but no focal pneumonia.  IMPRESSION: 1. There is no evidence of gastroenteritis or diverticulitis nor other acute bowel abnormality. 2. There are gallstones in the gallbladder is mildly distended, but there is no objective evidence of acute cholecystitis. 3. There is a transplanted kidney in the right iliac fossa. The native kidneys are atrophic and exhibit multiple presumed cysts. 4. There is a rim calcified structure in the left groin which may reflect a pseudoaneurysm. There are adjacent Gortex graft-like structures present. Correlation with the patient's clinical history and clinical exam in this regard is needed.   Electronically Signed   By: David  [place]   On: 03/12/2014 20:48    Other results: EKG 8/11: Sinus bradycardia with sinus arrhythmia; septal infarct , age undetermined; ST & T wave abnormality, consider lateral ischemia EKG 8/12:  Normal sinus rhythm; septal infarct , age undetermined   Assessment & Plan by Problem:  1.  Right-sided abdominal pain with nausea, vomiting, and diarrhea.  The etiology is unclear; patient reports that her diarrhea has improved today but she still is nauseated with occasional dry heaves.  The distended gallbladder seen on CT scan raises question of gallstone disease.  She has a transplanted kidney on the right but her renal function is normal.  Differential includes  gastroenteritis, gallstone disease, or other process.  Plans include right upper quadrant ultrasound; stool for culture, fecal lactoferrin, C. difficile toxin, and GI pathogen panel; antiemetics; IV fluid.  2.  S/P renal transplant.  Patient is status post renal transplant, and is on immunosuppression with tacrolimus and mycophenolate.  A tacrolimus level has been ordered and is pending.  Plan is consult nephrology regarding management of immunosuppressants in the acute setting.   3.  History of diastolic congestive heart failure.  Patient has no signs of volume overload.  4.  Hypertension.  Oral antihypertensives are on hold due to nausea vomiting; plan is monitor blood pressure and use parenteral agents as indicated.  5.  Other problems and plans as per the resident physician's note.

## 2014-03-13 NOTE — H&P (Signed)
Date: 03/13/2014               Patient Name:  [name] MRN: [id number]  DOB: Aug 05, 1931 Age / Sex: 80 y.o., female   PCP: No primary provider on file.         Medical Service: Internal Medicine Teaching Service         Attending Physician: Dr. Farley Ly, MD    First Contact: Dr. Senaida Ores Pager: [phone]  Second Contact: Dr. Zada Girt Pager: [phone]       After Hours (After 5p/  First Contact Pager: [phone]  weekends / holidays): Second Contact Pager: [phone]   Chief Complaint: Abdominal pain  History of Present Illness: [name] is an 80 year old woman with history of focal sclerosing glomerulonephritis s/p kidney transplant in 2003, diastolic CHF, CAD, HTN presenting with abdominal pain, nausea, vomiting. Her abdominal pain started 2 days ago. It is diffuse, worsening. She reports loose to watery diarrhea that started last night. She had multiple BM this morning. Denies hematochezia or melena. She had nausea and vomiting that started this morning - bilious, nonbloody. She has subjective fevers, chills, diaphoresis and decreased PO intake. She denies any triggering foods - had [employer] yesterday. She has not had any recent travel and does not have any sick contacts. She had a similar episode in 2007 that was attributed to E. coli gastroenteritis. Reports lightheadedness. Denies chest pain, shortness of breath, dysuria, hematuria, rash, or edema.  Meds: Current Facility-Administered Medications  Medication Dose Route Frequency Provider Last Rate Last Dose  . promethazine (PHENERGAN) suppository 12.5 mg  12.5 mg Rectal Q6H PRN Candyce Churn III, MD   12.5 mg at 03/12/14 1626   Current Outpatient Prescriptions  Medication Sig Dispense Refill  . acetaminophen (TYLENOL) 500 MG tablet Take 500 mg by mouth every 6 (six) hours as needed for mild pain.      Marland Kitchen allopurinol (ZYLOPRIM) 100 MG tablet Take 100 mg by mouth at bedtime.      Marland Kitchen amLODipine (NORVASC) 10 MG tablet Take  5 mg by mouth Daily.       Marland Kitchen aspirin EC 81 MG tablet Take 81 mg by mouth daily.      . cinacalcet (SENSIPAR) 90 MG tablet Take 90 mg by mouth daily.      . colchicine 0.6 MG tablet Take 0.6 mg by mouth daily as needed. For gout      . docusate sodium (COLACE) 100 MG capsule Take 100 mg by mouth 2 (two) times daily as needed. For constipation      . famotidine (PEPCID) 20 MG tablet Take 20 mg by mouth 2 (two) times daily.      . furosemide (LASIX) 80 MG tablet Take 80 mg by mouth 2 (two) times daily.       Marland Kitchen losartan (COZAAR) 100 MG tablet Take 100 mg by mouth daily.      . metoprolol (LOPRESSOR) 50 MG tablet Take 100 mg by mouth 2 (two) times daily.      . mycophenolate (CELLCEPT) 250 MG capsule Take 500 mg by mouth 2 (two) times daily.      . simvastatin (ZOCOR) 20 MG tablet Take 20 mg by mouth at bedtime.       . tacrolimus (PROGRAF) 0.5 MG capsule Take 0.5 mg by mouth daily. Take 1 capsule by mouth every morning along with the 1mg  capsules for a total dose of 2.5mg  every morning and 2mg       .  tacrolimus (PROGRAF) 1 MG capsule Take 2 mg by mouth 2 (two) times daily.         Allergies: Allergies as of 03/12/2014 - Review Complete 03/12/2014  Allergen Reaction Noted  . Sulfa drugs cross reactors Swelling 12/03/2011   Past Medical History  Diagnosis Date  . S/P kidney transplant 12/15/2001    due to FSGN, follows with Dr. Briant Cedar  . Coronary artery disease   . Hypertension   . Cholelithiasis   . Diastolic CHF   . Gout     unconfirmed by joint aspiration  . Chronic kidney disease    Past Surgical History  Procedure Laterality Date  . Kidney transplant    . Abdominal hysterectomy     No family history on file. History   Social History  . Marital Status: Divorced    Spouse Name: N/A    Number of Children: N/A  . Years of Education: N/A   Occupational History  . Not on file.   Social History Main Topics  . Smoking status: Former Smoker    Quit date: 04/19/1969  . Smokeless  tobacco: Never Used  . Alcohol Use: No  . Drug Use: No  . Sexual Activity: No   Other Topics Concern  . Not on file   Social History Narrative   Patient's husband died in 2008/12/15.    Review of Systems: Constitutional: + subjective fevers/chills Eyes: no vision changes Ears, nose, mouth, throat, and face: no cough Respiratory: no shortness of breath Cardiovascular: no chest pain Gastrointestinal: per HPI Genitourinary: no dysuria, no hematuria Integument: no rash Hematologic/lymphatic: no bleeding/bruising, no edema Musculoskeletal: no myalgias Neurological: no paresthesias, no weakness  Physical Exam: Blood pressure 138/51, pulse 53, temperature 97.7 F (36.5 C), temperature source Oral, resp. rate 21, SpO2 100.00%. General Apperance: NAD Head: Normocephalic, atraumatic Eyes: PERRL, EOMI, anicteric sclera Ears: Nares normal, septum midline, mucosa normal Throat: Lips, mucosa and tongue normal  Neck: Supple, trachea midline Back: No tenderness or bony abnormality  Lungs: Clear to auscultation bilaterally. No wheezes, rhonchi or rales. Breathing  comfortably Chest Wall: Nontender, no deformity Heart: Regular rate and rhythm, no murmur/rub/gallop Abdomen: Soft, tender to palpation diffusely but most in R mid abdomen, nondistended,  no rebound/guarding. Well healed scar from kidney transplant on R side. Extremities: Normal, warm and well perfused, no edema Pulses: 2+ throughout Skin: No rashes or lesions Neurologic: Alert and oriented x 3. CNII-XII intact. Normal strength and sensation  Lab results: Basic Metabolic Panel:  Recent Labs  96/04/54 1539  NA 139  K 4.0  CL 103  CO2 20  GLUCOSE 138*  BUN 19  CREATININE 1.01  CALCIUM 9.6   Liver Function Tests:  Recent Labs  03/12/14 1539  AST 19  ALT 7  ALKPHOS 137*  BILITOT 0.6  PROT 7.1  ALBUMIN 3.5    Recent Labs  03/12/14 1539  LIPASE 19    CBC:  Recent Labs  03/12/14 1539  WBC 8.2   NEUTROABS 7.1  HGB 11.7*  HCT 36.1  MCV 88.7  PLT 227   Cardiac Enzymes:  Recent Labs  03/12/14 1539 03/12/14 2355  TROPONINI <0.30 <0.30   Urinalysis:  Recent Labs  03/12/14 2118/12/16  COLORURINE YELLOW  LABSPEC 1.015  PHURINE 7.0  GLUCOSEU NEGATIVE  HGBUR TRACE*  BILIRUBINUR NEGATIVE  KETONESUR 15*  PROTEINUR 30*  UROBILINOGEN 0.2  NITRITE NEGATIVE  LEUKOCYTESUR NEGATIVE   Misc. Labs: Lactic Acid 1.83  Imaging results:  Ct Abdomen Pelvis Wo Contrast  03/12/2014   CLINICAL DATA:  Abdominal pain and vomiting and diarrhea; history of renal transplant in 2003  EXAM: CT ABDOMEN AND PELVIS WITHOUT CONTRAST  TECHNIQUE: Multidetector CT imaging of the abdomen and pelvis was performed following the standard protocol without IV contrast.  COMPARISON:  None.  FINDINGS: The gallbladder is mildly distended, exhibits no wall thickening, and contains tiny radiodense stones or sludge. The liver exhibits no focal mass or ductal dilation. The spleen, partially distended stomach, pancreas, and adrenal glands are normal. There is bilateral renal atrophy. There are hypodensities associated with both kidneys compatible with cysts. There is a transplanted kidney in the right iliac fossa. There is no significant hydronephrosis. The caliber of the abdominal aorta is normal. The periaortic and pericaval regions are normal.  There is no small or large bowel obstructive pattern. There is no evidence of enteritis. There are scattered colonic diverticula especially in the sigmoid. There is no evidence of acute diverticulitis. The appendix is not discretely demonstrated but no pericecal abnormality is demonstrated. The urinary bladder is moderately distended and grossly normal. The uterus is surgically absent. There is a structure in the left groin which may be patent dialysis graft or pseudoaneurysm but I have no clinical history in this regard.  There are degenerative changes of the lumbar spine and both hips.  The lung bases exhibit fibrotic changes but no focal pneumonia.  IMPRESSION: 1. There is no evidence of gastroenteritis or diverticulitis nor other acute bowel abnormality. 2. There are gallstones in the gallbladder is mildly distended, but there is no objective evidence of acute cholecystitis. 3. There is a transplanted kidney in the right iliac fossa. The native kidneys are atrophic and exhibit multiple presumed cysts. 4. There is a rim calcified structure in the left groin which may reflect a pseudoaneurysm. There are adjacent Gortex graft-like structures present. Correlation with the patient's clinical history and clinical exam in this regard is needed.   Electronically Signed   By: David  [place]   On: 03/12/2014 20:48    Other results: EKG: Sinus bradycardia, slight ST depression in I, II, III, avf, and slight ST elevation in V1, V2, V3  Assessment & Plan by Problem: Active Problems:   S/P kidney transplant   Diastolic CHF   CAD (coronary artery disease)   Hypertension   Vomiting and diarrhea  Abdominal pain, N/V: Afebrile, normal HR, mildly tachypneic in ED, no leukocytosis. Does not meet SIRS criteria. CT abdomen/pelvis with no evidence of gastroenteritis, diverticulitis or acute bowel abnormality. +Gallstones but no acute cholecystitis. Urinalysis negative for leukocytes or nitrites. Differential includes viral or bacterial gastroenteritis. Mesenteric ischemia is less likely as her exam does not support it and she does not have any hematochezia or melena. Lactic acid normal. BMP was unremarkable making metabolic abnormalities less likely. UA negative makes pyelonephritis or cystitis less likely as well. -NPO -phenergan prn nausea -Check CMV, stool pathogen panel -Gi cocktail prn  Diastolic CHF, CAD: Troponin negative. EKG with new slight ST depression in I, II, III, avf, and slight ST elevation in V1, V2, V3. Does not appear to be in acute exacerbation as she appears euvolemic on  exam. -Cycle troponin -Recheck EKG in AM -ASA 81 held -losartan 100mg  held -metoprolol 50mg  BID held -furosemide 80mg  BID held -simvastatin 20mg  held  HTN: chronic, stable -amlodpine 10mg  held -losartan 100mg  held -metoprolol 50mg  BID held -furosemide 80mg  BID held  Focal sclerosing glomerulonephritis s/p kidney transplant in 2003: on Cellcept and  tacro -check tacro level -  restart home Cellcept and tacro  Gout: chronic, stable -allopurinol 100mg  held -cochicine 0.6mg  held  FEN: NPO  DVT ppx: subq hep 5000u TID  Dispo: Disposition is deferred at this time, awaiting improvement of current medical problems. Anticipated discharge in approximately 1 day(s).   The patient does have a current PCP (No primary provider on file.) and does need an [facility] hospital follow-up appointment after discharge.  The patient does not know have transportation limitations that hinder transportation to clinic appointments.  Signed: Griffin Basil, MD 03/13/2014, 4:09 AM

## 2014-03-13 NOTE — Progress Notes (Signed)
Called ER RN for report. Roomready for admit. 

## 2014-03-13 NOTE — Consult Note (Signed)
Reason for Consult:Renal Transplant Referring Physician: Dr. Zigmund [name] is an 80 y.o. female.  HPI: 80 yr female with CAD Renal TX @ [facility]  06/2002.  , hx CKD3 in [place], original dz FSGS. Other prob gout, ^ lipids, HPTH, , HTN, and recent L wrist fx from fall.  Presents with 5 d of progressive abdm dyscomfort.  Initially started as cramping and anorexia, but started with N, V, D 2 d ago, not holding down food or fluids.  Has not taken immunosuppressives for 2 d.  Cr 1.1 here. Baseline 1.3-1.5.  Feels chills, but no blood in D or V.  No one around her with this illness but did attend a large gathering on Sat.  No recent travel.  Has no hx med intol in past. One episode of gastroenteritis about 5 yr ago.   Constitutional: as above Eyes: negative Ears, nose, mouth, throat, and face: negative Respiratory: negative Cardiovascular: negative Gastrointestinal: as above Genitourinary:negative Integument/breast: negative Hematologic/lymphatic: negative Musculoskeletal:no recent gout, hx of fx Neurological: negative Allergic/Immunologic: see above    Primary Nephrologist [name].   Past Medical History  Diagnosis Date  . S/P kidney transplant 2003    due to FSGN, follows with Dr. Mercy [name]  . Coronary artery disease   . Hypertension   . Cholelithiasis   . Diastolic CHF   . Gout     unconfirmed by joint aspiration  . Chronic kidney disease     Past Surgical History  Procedure Laterality Date  . Kidney transplant    . Abdominal hysterectomy      History reviewed. No pertinent family history.  Social History:  reports that she quit smoking about 44 years ago. She has never used smokeless tobacco. She reports that she does not drink alcohol or use illicit drugs.  Allergies:  Allergies  Allergen Reactions  . Sulfa Drugs Cross Reactors Swelling    Medications:  I have reviewed the patient's current medications. Prior to Admission:  Prescriptions prior to admission   Medication Sig Dispense Refill  . acetaminophen (TYLENOL) 500 MG tablet Take 500 mg by mouth every 6 (six) hours as needed for mild pain.      Marland Kitchen allopurinol (ZYLOPRIM) 100 MG tablet Take 100 mg by mouth at bedtime.      Marland Kitchen amLODipine (NORVASC) 10 MG tablet Take 5 mg by mouth Daily.       Marland Kitchen aspirin EC 81 MG tablet Take 81 mg by mouth daily.      . cinacalcet (SENSIPAR) 90 MG tablet Take 90 mg by mouth daily.      . colchicine 0.6 MG tablet Take 0.6 mg by mouth daily as needed. For gout      . docusate sodium (COLACE) 100 MG capsule Take 100 mg by mouth 2 (two) times daily as needed. For constipation      . famotidine (PEPCID) 20 MG tablet Take 20 mg by mouth 2 (two) times daily.      . furosemide (LASIX) 80 MG tablet Take 80 mg by mouth 2 (two) times daily.       Marland Kitchen losartan (COZAAR) 100 MG tablet Take 100 mg by mouth daily.      . metoprolol (LOPRESSOR) 50 MG tablet Take 100 mg by mouth 2 (two) times daily.      . mycophenolate (CELLCEPT) 250 MG capsule Take 500 mg by mouth 2 (two) times daily.      . simvastatin (ZOCOR) 20 MG tablet Take 20 mg by mouth  at bedtime.       . tacrolimus (PROGRAF) 0.5 MG capsule Take 0.5 mg by mouth daily. Take 1 capsule by mouth every morning along with the 73m capsules for a total dose of 2.573mevery morning and 57m48m    . tacrolimus (PROGRAF) 1 MG capsule Take 2 mg by mouth 2 (two) times daily.         Results for orders placed during the hospital encounter of 03/12/14 (from the past 48 hour(s))  CBC WITH DIFFERENTIAL     Status: Abnormal   Collection Time    03/12/14  3:39 PM      Result Value Ref Range   WBC 8.2  4.0 - 10.5 K/uL   RBC 4.07  3.87 - 5.11 MIL/uL   Hemoglobin 11.7 (*) 12.0 - 15.0 g/dL   HCT 36.1  36.0 - 46.0 %   MCV 88.7  78.0 - 100.0 fL   MCH 28.7  26.0 - 34.0 pg   MCHC 32.4  30.0 - 36.0 g/dL   RDW 13.0  11.5 - 15.5 %   Platelets 227  150 - 400 K/uL   Neutrophils Relative % 87 (*) 43 - 77 %   Neutro Abs 7.1  1.7 - 7.7 K/uL    Lymphocytes Relative 9 (*) 12 - 46 %   Lymphs Abs 0.8  0.7 - 4.0 K/uL   Monocytes Relative 4  3 - 12 %   Monocytes Absolute 0.3  0.1 - 1.0 K/uL   Eosinophils Relative 0  0 - 5 %   Eosinophils Absolute 0.0  0.0 - 0.7 K/uL   Basophils Relative 0  0 - 1 %   Basophils Absolute 0.0  0.0 - 0.1 K/uL  COMPREHENSIVE METABOLIC PANEL     Status: Abnormal   Collection Time    03/12/14  3:39 PM      Result Value Ref Range   Sodium 139  137 - 147 mEq/L   Potassium 4.0  3.7 - 5.3 mEq/L   Chloride 103  96 - 112 mEq/L   CO2 20  19 - 32 mEq/L   Glucose, Bld 138 (*) 70 - 99 mg/dL   BUN 19  6 - 23 mg/dL   Creatinine, Ser 1.01  0.50 - 1.10 mg/dL   Calcium 9.6  8.4 - 10.5 mg/dL   Total Protein 7.1  6.0 - 8.3 g/dL   Albumin 3.5  3.5 - 5.2 g/dL   AST 19  0 - 37 U/L   Comment: HEMOLYSIS AT THIS LEVEL MAY AFFECT RESULT   ALT 7  0 - 35 U/L   Alkaline Phosphatase 137 (*) 39 - 117 U/L   Total Bilirubin 0.6  0.3 - 1.2 mg/dL   GFR calc non Af Amer 51 (*) >90 mL/min   GFR calc Af Amer 59 (*) >90 mL/min   Comment: (NOTE)     The eGFR has been calculated using the CKD EPI equation.     This calculation has not been validated in all clinical situations.     eGFR's persistently <90 mL/min signify possible Chronic Kidney     Disease.   Anion gap 16 (*) 5 - 15  LIPASE, BLOOD     Status: None   Collection Time    03/12/14  3:39 PM      Result Value Ref Range   Lipase 19  11 - 59 U/L  TROPONIN I     Status: None   Collection Time  03/12/14  3:39 PM      Result Value Ref Range   Troponin I <0.30  <0.30 ng/mL   Comment:            Due to the release kinetics of cTnI,     a negative result within the first hours     of the onset of symptoms does not rule out     myocardial infarction with certainty.     If myocardial infarction is still suspected,     repeat the test at appropriate intervals.  I-STAT CG4 LACTIC ACID, ED     Status: None   Collection Time    03/12/14  4:56 PM      Result Value Ref Range    Lactic Acid, Venous 1.83  0.5 - 2.2 mmol/L  URINALYSIS, ROUTINE W REFLEX MICROSCOPIC     Status: Abnormal   Collection Time    03/12/14  9:20 PM      Result Value Ref Range   Color, Urine YELLOW  YELLOW   APPearance CLEAR  CLEAR   Specific Gravity, Urine 1.015  1.005 - 1.030   pH 7.0  5.0 - 8.0   Glucose, UA NEGATIVE  NEGATIVE mg/dL   Hgb urine dipstick TRACE (*) NEGATIVE   Bilirubin Urine NEGATIVE  NEGATIVE   Ketones, ur 15 (*) NEGATIVE mg/dL   Protein, ur 30 (*) NEGATIVE mg/dL   Urobilinogen, UA 0.2  0.0 - 1.0 mg/dL   Nitrite NEGATIVE  NEGATIVE   Leukocytes, UA NEGATIVE  NEGATIVE  URINE MICROSCOPIC-ADD ON     Status: None   Collection Time    03/12/14  9:20 PM      Result Value Ref Range   Squamous Epithelial / LPF RARE  RARE   RBC / HPF 0-2  <3 RBC/hpf   Bacteria, UA RARE  RARE  TROPONIN I     Status: None   Collection Time    03/12/14 11:55 PM      Result Value Ref Range   Troponin I <0.30  <0.30 ng/mL   Comment:            Due to the release kinetics of cTnI,     a negative result within the first hours     of the onset of symptoms does not rule out     myocardial infarction with certainty.     If myocardial infarction is still suspected,     repeat the test at appropriate intervals.  CBC     Status: Abnormal   Collection Time    03/13/14  6:51 AM      Result Value Ref Range   WBC 7.4  4.0 - 10.5 K/uL   RBC 4.00  3.87 - 5.11 MIL/uL   Hemoglobin 11.7 (*) 12.0 - 15.0 g/dL   HCT 36.5  36.0 - 46.0 %   MCV 91.3  78.0 - 100.0 fL   MCH 29.3  26.0 - 34.0 pg   MCHC 32.1  30.0 - 36.0 g/dL   RDW 13.4  11.5 - 15.5 %   Platelets 226  150 - 400 K/uL  COMPREHENSIVE METABOLIC PANEL     Status: Abnormal   Collection Time    03/13/14  6:51 AM      Result Value Ref Range   Sodium 140  137 - 147 mEq/L   Potassium 4.1  3.7 - 5.3 mEq/L   Chloride 103  96 - 112 mEq/L   CO2 19  19 -  32 mEq/L   Glucose, Bld 106 (*) 70 - 99 mg/dL   BUN 19  6 - 23 mg/dL   Creatinine, Ser 1.09   0.50 - 1.10 mg/dL   Calcium 9.6  8.4 - 10.5 mg/dL   Total Protein 6.7  6.0 - 8.3 g/dL   Albumin 3.3 (*) 3.5 - 5.2 g/dL   AST 16  0 - 37 U/L   ALT 5  0 - 35 U/L   Alkaline Phosphatase 127 (*) 39 - 117 U/L   Total Bilirubin 0.5  0.3 - 1.2 mg/dL   GFR calc non Af Amer 47 (*) >90 mL/min   GFR calc Af Amer 54 (*) >90 mL/min   Comment: (NOTE)     The eGFR has been calculated using the CKD EPI equation.     This calculation has not been validated in all clinical situations.     eGFR's persistently <90 mL/min signify possible Chronic Kidney     Disease.   Anion gap 18 (*) 5 - 15  TROPONIN I     Status: None   Collection Time    03/13/14  6:51 AM      Result Value Ref Range   Troponin I <0.30  <0.30 ng/mL   Comment:            Due to the release kinetics of cTnI,     a negative result within the first hours     of the onset of symptoms does not rule out     myocardial infarction with certainty.     If myocardial infarction is still suspected,     repeat the test at appropriate intervals.    Ct Abdomen Pelvis Wo Contrast  03/12/2014   CLINICAL DATA:  Abdominal pain and vomiting and diarrhea; history of renal transplant in 2003  EXAM: CT ABDOMEN AND PELVIS WITHOUT CONTRAST  TECHNIQUE: Multidetector CT imaging of the abdomen and pelvis was performed following the standard protocol without IV contrast.  COMPARISON:  None.  FINDINGS: The gallbladder is mildly distended, exhibits no wall thickening, and contains tiny radiodense stones or sludge. The liver exhibits no focal mass or ductal dilation. The spleen, partially distended stomach, pancreas, and adrenal glands are normal. There is bilateral renal atrophy. There are hypodensities associated with both kidneys compatible with cysts. There is a transplanted kidney in the right iliac fossa. There is no significant hydronephrosis. The caliber of the abdominal aorta is normal. The periaortic and pericaval regions are normal.  There is no small or  large bowel obstructive pattern. There is no evidence of enteritis. There are scattered colonic diverticula especially in the sigmoid. There is no evidence of acute diverticulitis. The appendix is not discretely demonstrated but no pericecal abnormality is demonstrated. The urinary bladder is moderately distended and grossly normal. The uterus is surgically absent. There is a structure in the left groin which may be patent dialysis graft or pseudoaneurysm but I have no clinical history in this regard.  There are degenerative changes of the lumbar spine and both hips. The lung bases exhibit fibrotic changes but no focal pneumonia.  IMPRESSION: 1. There is no evidence of gastroenteritis or diverticulitis nor other acute bowel abnormality. 2. There are gallstones in the gallbladder is mildly distended, but there is no objective evidence of acute cholecystitis. 3. There is a transplanted kidney in the right iliac fossa. The native kidneys are atrophic and exhibit multiple presumed cysts. 4. There is a rim calcified structure in the left  groin which may reflect a pseudoaneurysm. There are adjacent Gortex graft-like structures present. Correlation with the patient's clinical history and clinical exam in this regard is needed.   Electronically Signed   By: David  [place]   On: 03/12/2014 20:48   US Abdomen Limited Ruq  03/13/2014   CLINICAL DATA:  Nausea and vomiting, history of renal transplant and gallstones  EXAM: US ABDOMEN LIMITED - RIGHT UPPER QUADRANT  COMPARISON:  None.  FINDINGS: Gallbladder:  There are numerous gallstones the largest measuring 2.2 cm. Gallbladder wall is not thickened and there is no Murphy's sign.  Common bile duct:  Diameter: 5 mm  Liver:  No focal lesion identified. Within normal limits in parenchymal echogenicity.  IMPRESSION: Cholelithiasis   Electronically Signed   By: Skipper Cliche [name]   On: 03/13/2014 15:52    ROS Blood pressure 146/60, pulse 58, temperature 98.4 F (36.9 C),  temperature source Oral, resp. rate 18, height 5' 2"  (1.575 m), weight 73.936 kg (163 lb), SpO2 98.00%. Physical Exam Physical Examination: General appearance - no acute distress, but says doesn't feel good Mental status - alert, oriented to person, place, and time Eyes - pupils equal and reactive, extraocular eye movements intact, funduscopic exam normal, discs flat and sharp Mouth - thrush noted Neck - adenopathy noted PCL Lymphatics - posterior cervical nodes Chest - clear to auscultation, no wheezes, rales or rhonchi, symmetric air entry Heart - S1 and S2 normal, systolic murmur  Gr 2/6 at apex Abdomen - pos bs, TX RLQ, mild diffuse tenderness, BS erratic Musculoskeletal - brace L wrist Extremities - peripheral pulses normal, no pedal edema, no clubbing or cyanosis, no pedal edema noted Skin - normal coloration and turgor, no rashes, no suspicious skin lesions noted  Assessment/Plan: 1 N, V, D most likely infectious enteritis.  Norovirus most common, but consider CMV or Polyoma.  Hydration adequate, and holding ARB, colchicine, Cinnacalcet, Allopurinol, as well as other BP meds.  GB dz less likely but poss.  Does not seem ishemic and not typical of drug toxicity.  Cellcept commonly gives D but usually not this late and not this picture.  Prograf toxicity can be N,V,D but most unusual.  Usually neuologic or renal toxicity before GI.  This is most typical of infectious.  Not typical for ischemia 2 Renal TX would not hold Tac or Mycophenolate and try to give with sip of water.  If not tolerating, Give Prograf 2 mg IV bid and usual dose Mycophenolate IV bid 3 Hypertension: not an issue 4. Gout hold meds 5. Metabolic Bone Disease: hold Cinnacalcet, as freq can give GI upset but usually not this picture. 6 ^ Lipids hold meds P IVF, trial po Tac and Mycophenolate, if not IV as outlined above.  Hold other meds.  CMV, BK, EBV PCRs  For full outpatient record, see shadow chart.     [name] L 03/13/2014, 4:40 PM

## 2014-03-14 LAB — BASIC METABOLIC PANEL
ANION GAP: 15 (ref 5–15)
BUN: 21 mg/dL (ref 6–23)
CALCIUM: 10 mg/dL (ref 8.4–10.5)
CHLORIDE: 106 meq/L (ref 96–112)
CO2: 20 meq/L (ref 19–32)
Creatinine, Ser: 1.09 mg/dL (ref 0.50–1.10)
GFR calc Af Amer: 54 mL/min — ABNORMAL LOW (ref >90)
GFR calc non Af Amer: 47 mL/min — ABNORMAL LOW (ref >90)
Glucose, Bld: 93 mg/dL (ref 70–99)
Potassium: 4 mEq/L (ref 3.7–5.3)
Sodium: 141 mEq/L (ref 137–147)

## 2014-03-14 MED ORDER — SODIUM CHLORIDE 0.9 % IV SOLN
INTRAVENOUS | Status: AC
  Administered 2014-03-14: 07:00:00 via INTRAVENOUS

## 2014-03-14 NOTE — Progress Notes (Signed)
Subjective: Interval History: has complaints ,D gone but dry heaves. Holding prograf and cellcept down.  Objective: Vital signs in last 24 hours: Temp:  [98.2 F (36.8 C)-98.4 F (36.9 C)] 98.4 F (36.9 C) (08/13 0542) Pulse Rate:  [56-81] 81 (08/13 0542) Resp:  [16-18] 18 (08/13 0542) BP: (138-170)/(57-75) 155/75 mmHg (08/13 0542) SpO2:  [99 %-100 %] 100 % (08/13 0542) Weight:  [73.6 kg (162 lb 4.1 oz)] 73.6 kg (162 lb 4.1 oz) (08/12 2145) Weight change: -0.336 kg (-11.9 oz)  Intake/Output from previous day:   Intake/Output this shift: Total I/O In: 101.3 [I.V.:101.3] Out: -   General appearance: alert, cooperative and mild distress Resp: clear to auscultation bilaterally Cardio: S1, S2 normal and systolic murmur: systolic ejection 2/6, decrescendo at 2nd left intercostal space GI: pos bs, Tx RLQ normal,  Extremities: brace L wrist  Lab Results:  Recent Labs  03/12/14 1539 03/13/14 0651  WBC 8.2 7.4  HGB 11.7* 11.7*  HCT 36.1 36.5  PLT 227 226   BMET:  Recent Labs  03/13/14 0651 03/14/14 0730  NA 140 141  K 4.1 4.0  CL 103 106  CO2 19 20  GLUCOSE 106* 93  BUN 19 21  CREATININE 1.09 1.09  CALCIUM 9.6 10.0   No results found for this basename: PTH,  in the last 72 hours Iron Studies: No results found for this basename: IRON, TIBC, TRANSFERRIN, FERRITIN,  in the last 72 hours  Studies/Results: Ct Abdomen Pelvis Wo Contrast  03/12/2014   CLINICAL DATA:  Abdominal pain and vomiting and diarrhea; history of renal transplant in 2003  EXAM: CT ABDOMEN AND PELVIS WITHOUT CONTRAST  TECHNIQUE: Multidetector CT imaging of the abdomen and pelvis was performed following the standard protocol without IV contrast.  COMPARISON:  None.  FINDINGS: The gallbladder is mildly distended, exhibits no wall thickening, and contains tiny radiodense stones or sludge. The liver exhibits no focal mass or ductal dilation. The spleen, partially distended stomach, pancreas, and adrenal  glands are normal. There is bilateral renal atrophy. There are hypodensities associated with both kidneys compatible with cysts. There is a transplanted kidney in the right iliac fossa. There is no significant hydronephrosis. The caliber of the abdominal aorta is normal. The periaortic and pericaval regions are normal.  There is no small or large bowel obstructive pattern. There is no evidence of enteritis. There are scattered colonic diverticula especially in the sigmoid. There is no evidence of acute diverticulitis. The appendix is not discretely demonstrated but no pericecal abnormality is demonstrated. The urinary bladder is moderately distended and grossly normal. The uterus is surgically absent. There is a structure in the left groin which may be patent dialysis graft or pseudoaneurysm but I have no clinical history in this regard.  There are degenerative changes of the lumbar spine and both hips. The lung bases exhibit fibrotic changes but no focal pneumonia.  IMPRESSION: 1. There is no evidence of gastroenteritis or diverticulitis nor other acute bowel abnormality. 2. There are gallstones in the gallbladder is mildly distended, but there is no objective evidence of acute cholecystitis. 3. There is a transplanted kidney in the right iliac fossa. The native kidneys are atrophic and exhibit multiple presumed cysts. 4. There is a rim calcified structure in the left groin which may reflect a pseudoaneurysm. There are adjacent Gortex graft-like structures present. Correlation with the patient's clinical history and clinical exam in this regard is needed.   Electronically Signed   By: David  [place]  On: 03/12/2014 20:48   [place] Abdomen Limited Ruq  03/13/2014   CLINICAL DATA:  Nausea and vomiting, history of renal transplant and gallstones  EXAM: US ABDOMEN LIMITED - RIGHT UPPER QUADRANT  COMPARISON:  None.  FINDINGS: Gallbladder:  There are numerous gallstones the largest measuring 2.2 cm. Gallbladder wall is not  thickened and there is no Murphy's sign.  Common bile duct:  Diameter: 5 mm  Liver:  No focal lesion identified. Within normal limits in parenchymal echogenicity.  IMPRESSION: Cholelithiasis   Electronically Signed   By: Esperanza Heiraymond  Rubner [name]   On: 03/13/2014 15:52    I have reviewed the patient's current medications.  Assessment/Plan: 1 Renal Tx taking meds will monitor 2 N, V improving  Suspect infectious 3 HTN 4 HPTH 5 ^ lipids P IVF, po meds, liquids only,viral studies    LOS: 2 days   Cowan Pilar L 03/14/2014,9:53 AM

## 2014-03-14 NOTE — Progress Notes (Signed)
Internal Medicine Attending  Date: 03/14/2014  Patient name: [name] Medical record number: [id number] Date of birth: Jun 18, 1932 Age: 80 y.o. Gender: female  I saw and evaluated the patient, and discussed her care with resident on A.M rounds.  I reviewed the resident's note by Dr. Senaida Oresichardson and I agree with the resident's findings and plans as documented in her note, with the following additional comments.  Patient was reportedly on a substantial antihypertensive regimen prior to admission, which we held because of her nausea/vomiting.  Her blood pressure has been moderately elevated off of medications.  Hopefully we can reinstitute her antihypertensives if she is able to take P.O., but I would do this with careful attention to her blood pressure.  Would also try confirm whether she was taking all of those medications prior to admission.

## 2014-03-14 NOTE — Progress Notes (Signed)
Subjective:  Patient was seen and examined this morning. Patient states her abdominal pain has improved, but she still feels nauseous, having dry heaves, but less than yesterday. She feels very fatigued and weak, but denies fever, chills or diarrhea. She feels thirsty and would like to try ginger ale and jello later today.   Objective: Vital signs in last 24 hours: Filed Vitals:   03/13/14 0925 03/13/14 1732 03/13/14 2145 03/14/14 0542  BP: 146/60 138/57 170/65 155/75  Pulse: 58 56 62 81  Temp: 98.4 F (36.9 C) 98.2 F (36.8 C) 98.4 F (36.9 C) 98.4 F (36.9 C)  TempSrc: Oral Oral Oral Oral  Resp: 18 18 16 18   Height:      Weight:   73.6 kg (162 lb 4.1 oz)   SpO2: 98% 99% 99% 100%   Weight change: -0.336 kg (-11.9 oz)  Intake/Output Summary (Last 24 hours) at 03/14/14 4098 Last data filed at 03/14/14 0600  Gross per 24 hour  Intake      0 ml  Output      0 ml  Net      0 ml   General: Vital signs reviewed.  Patient is well-developed and well-nourished, in mild distress.  Cardiovascular: RRR, S1 normal, S2 normal, no murmurs, gallops, or rubs. Pulmonary/Chest: Clear to auscultation bilaterally, no wheezes, rales, or rhonchi. Abdominal: Soft, non-tender to palpation, non-distended, decreased BS, + mass or right side of abdomen, most likely from kidney transplant underneath her scar, no organomegaly, no guarding present.  Musculoskeletal: No joint deformities, erythema, or stiffness, ROM full and nontender. Extremities: No lower extremity edema bilaterally,  pulses symmetric and intact bilaterally. No cyanosis or clubbing. Neurological: A&O x3, Strength is normal and symmetric bilaterally, no focal motor deficit, sensory intact to light touch bilaterally.  Skin: Warm, dry and intact. No rashes or erythema. Psychiatric: Normal mood and affect. speech and behavior is normal. Cognition and memory are normal.    Lab Results: Basic Metabolic Panel:  Recent Labs Lab  03/12/14 1539 03/13/14 0651  NA 139 140  K 4.0 4.1  CL 103 103  CO2 20 19  GLUCOSE 138* 106*  BUN 19 19  CREATININE 1.01 1.09  CALCIUM 9.6 9.6   Liver Function Tests:  Recent Labs Lab 03/12/14 1539 03/13/14 0651  AST 19 16  ALT 7 5  ALKPHOS 137* 127*  BILITOT 0.6 0.5  PROT 7.1 6.7  ALBUMIN 3.5 3.3*    Recent Labs Lab 03/12/14 1539  LIPASE 19   CBC:  Recent Labs Lab 03/12/14 1539 03/13/14 0651  WBC 8.2 7.4  NEUTROABS 7.1  --   HGB 11.7* 11.7*  HCT 36.1 36.5  MCV 88.7 91.3  PLT 227 226   Cardiac Enzymes:  Recent Labs Lab 03/12/14 1539 03/12/14 2355 03/13/14 0651  TROPONINI <0.30 <0.30 <0.30   Urinalysis:  Recent Labs Lab 03/12/14 2120  COLORURINE YELLOW  LABSPEC 1.015  PHURINE 7.0  GLUCOSEU NEGATIVE  HGBUR TRACE*  BILIRUBINUR NEGATIVE  KETONESUR 15*  PROTEINUR 30*  UROBILINOGEN 0.2  NITRITE NEGATIVE  LEUKOCYTESUR NEGATIVE   Studies/Results: Ct Abdomen Pelvis Wo Contrast  03/12/2014   CLINICAL DATA:  Abdominal pain and vomiting and diarrhea; history of renal transplant in 2003  EXAM: CT ABDOMEN AND PELVIS WITHOUT CONTRAST  TECHNIQUE: Multidetector CT imaging of the abdomen and pelvis was performed following the standard protocol without IV contrast.  COMPARISON:  None.  FINDINGS: The gallbladder is mildly distended, exhibits no wall thickening, and contains tiny  radiodense stones or sludge. The liver exhibits no focal mass or ductal dilation. The spleen, partially distended stomach, pancreas, and adrenal glands are normal. There is bilateral renal atrophy. There are hypodensities associated with both kidneys compatible with cysts. There is a transplanted kidney in the right iliac fossa. There is no significant hydronephrosis. The caliber of the abdominal aorta is normal. The periaortic and pericaval regions are normal.  There is no small or large bowel obstructive pattern. There is no evidence of enteritis. There are scattered colonic diverticula  especially in the sigmoid. There is no evidence of acute diverticulitis. The appendix is not discretely demonstrated but no pericecal abnormality is demonstrated. The urinary bladder is moderately distended and grossly normal. The uterus is surgically absent. There is a structure in the left groin which may be patent dialysis graft or pseudoaneurysm but I have no clinical history in this regard.  There are degenerative changes of the lumbar spine and both hips. The lung bases exhibit fibrotic changes but no focal pneumonia.  IMPRESSION: 1. There is no evidence of gastroenteritis or diverticulitis nor other acute bowel abnormality. 2. There are gallstones in the gallbladder is mildly distended, but there is no objective evidence of acute cholecystitis. 3. There is a transplanted kidney in the right iliac fossa. The native kidneys are atrophic and exhibit multiple presumed cysts. 4. There is a rim calcified structure in the left groin which may reflect a pseudoaneurysm. There are adjacent Gortex graft-like structures present. Correlation with the patient's clinical history and clinical exam in this regard is needed.   Electronically Signed   By: David  [place]   On: 03/12/2014 20:48   [place] Abdomen Limited Ruq  03/13/2014   CLINICAL DATA:  Nausea and vomiting, history of renal transplant and gallstones  EXAM: US ABDOMEN LIMITED - RIGHT UPPER QUADRANT  COMPARISON:  None.  FINDINGS: Gallbladder:  There are numerous gallstones the largest measuring 2.2 cm. Gallbladder wall is not thickened and there is no Murphy's sign.  Common bile duct:  Diameter: 5 mm  Liver:  No focal lesion identified. Within normal limits in parenchymal echogenicity.  IMPRESSION: Cholelithiasis   Electronically Signed   By: Esperanza Heiraymond  Rubner [name]   On: 03/13/2014 15:52   Medications: I have reviewed the patient's current medications.  Prescriptions prior to admission  Medication Sig Dispense Refill  . acetaminophen (TYLENOL) 500 MG tablet Take  500 mg by mouth every 6 (six) hours as needed for mild pain.      Marland Kitchen. allopurinol (ZYLOPRIM) 100 MG tablet Take 100 mg by mouth at bedtime.      Marland Kitchen. amLODipine (NORVASC) 10 MG tablet Take 5 mg by mouth Daily.       Marland Kitchen. aspirin EC 81 MG tablet Take 81 mg by mouth daily.      . cinacalcet (SENSIPAR) 90 MG tablet Take 90 mg by mouth daily.      . colchicine 0.6 MG tablet Take 0.6 mg by mouth daily as needed. For gout      . docusate sodium (COLACE) 100 MG capsule Take 100 mg by mouth 2 (two) times daily as needed. For constipation      . famotidine (PEPCID) 20 MG tablet Take 20 mg by mouth 2 (two) times daily.      . furosemide (LASIX) 80 MG tablet Take 80 mg by mouth 2 (two) times daily.       Marland Kitchen. losartan (COZAAR) 100 MG tablet Take 100 mg by mouth daily.      .Marland Kitchen  metoprolol (LOPRESSOR) 50 MG tablet Take 100 mg by mouth 2 (two) times daily.      . mycophenolate (CELLCEPT) 250 MG capsule Take 500 mg by mouth 2 (two) times daily.      . simvastatin (ZOCOR) 20 MG tablet Take 20 mg by mouth at bedtime.       . tacrolimus (PROGRAF) 0.5 MG capsule Take 0.5 mg by mouth daily. Take 1 capsule by mouth every morning along with the 1mg  capsules for a total dose of 2.5mg  every morning and 2mg       . tacrolimus (PROGRAF) 1 MG capsule Take 2 mg by mouth 2 (two) times daily.         Scheduled Meds: . heparin  5,000 Units Subcutaneous 3 times per day  . mycophenolate  500 mg Oral BID  . promethazine  12.5 mg Intravenous Q6H  . sodium chloride  3 mL Intravenous Q12H  . tacrolimus  2 mg Oral QHS  . tacrolimus  2.5 mg Oral Daily   Continuous Infusions: . sodium chloride 75 mL/hr at 03/14/14 0639   PRN Meds:.gi cocktail, meclizine, ondansetron (ZOFRAN) IV Assessment/Plan:  Abdominal pain, N/V: CT abdomen/pelvis with no evidence of gastroenteritis, diverticulitis or acute bowel abnormality. +Gallstones but no acute cholecystitis. Urinalysis negative for leukocytes or nitrites. Differential includes cholecystitis,  viral or bacterial gastroenteritis. Lactic acid normal. BMP continues to be unremarkable. Discussed CT findings with CT abdomen/pelvis with radiologist who suggested no evidence of cholecystitis, but recommended RUQ [place]. RUQ US showed there are numerous gallstones the largest measuring 2.2 cm. Gallbladder wall is not thickened and there is no Murphy's sign. Of note, patient has never had a colonoscopy. It would be important to set patient up with an outpatient GI consult after discharge. Dr. Darrick Penna, nephrology, was consulted due to the patient being a transplant patient and out of worry for tacrolimus toxicity. Dr. Darrick Penna does not believe her symptoms are due to Cellcept or Prograf-we will continue these. Her symptoms are most likely 2/2 an infectious etiology, but we will monitor her and consider cholelithiasis as the cause if she does not improve. Patient's abdominal pain has improved, but she continues to feel weak, fatigued and nauseous. She has been having less gagging and dry heaving. -NPO, but we will try ginger ale this morning. Patient would like to try jello, but I would like to see how she progresses this morning and then we can consider advancing to clear liquid diet. -phenergan 12.5 mg IV Q6H nausea  -CMV, stool pathogen panel pending, Fecal lactoferrin, C Dif PCR, EBV, BK virus, Stool culture pending -Gi cocktail prn  -NS 75 cc/hr  Diastolic CHF, CAD: Troponins negative x 3. EKG with new slight ST depression in I, II, III, avf, and slight ST elevation in V1, V2, V3. Does not appear to be in acute exacerbation as she appears euvolemic on exam. Repeat EKG is similar to prior. -ASA 81 held  -losartan 100mg  held  -metoprolol 50mg  BID held  -furosemide 80mg  BID held  -simvastatin 20mg  held   HTN: Chronic, stable. SBP 140-160s. -amlodpine 10mg  held  -losartan 100mg  held  -metoprolol 50mg  BID held  -furosemide 80mg  BID held   Focal sclerosing glomerulonephritis s/p kidney transplant  in 2003: on Cellcept and tacro. The patient was seen by Dr. Darrick Penna. Patient normally follows with Dr. Briant Cedar. Their level of suspicion for Tacrolimus toxicity is low, and they recommend restarting her Tacrolimus and Cellcept po. If po is not tolerated, we will switch to IV.  Hold all other medications. Patient states she tolerated her po medications yesterday and kept them down. -Tacrolimus level pending -Continue Cellcept and Tacrolimus po, if the patient is not tolerating, we will give these medications as prograf 2 mg IV BID and cellcept 500 mg BID IV.  Gout: chronic, stable  -allopurinol 100mg  held  -cochicine 0.6mg  held   FEN: NPO, except for sips with meds, ice chips and ginger ale. We will consider advancing to clear liquid diet this afternoon.   DVT ppx: subq hep 5000u TID  Dispo: Disposition is deferred at this time, awaiting improvement of current medical problems.  Anticipated discharge in approximately 1-2 day(s).   The patient does not have a current PCP (No primary provider on file.) and does not need an [facility] hospital follow-up appointment after discharge.  The patient does not have transportation limitations that hinder transportation to clinic appointments.  .Services Needed at time of discharge: Y = Yes, Blank = No PT:   OT:   RN:   Equipment:   Other:     LOS: 2 days   Jill Alexanders, DO PGY-1 Internal Medicine Resident Pager # [phone] 03/14/2014 8:12 AM

## 2014-03-14 NOTE — Discharge Summary (Signed)
Name: [name] MRN: [id number] DOB: 1931-12-27 80 y.o. PCP: No primary provider on file.  Date of Admission: 03/12/2014  2:43 PM Date of Discharge: 03/21/2014 Attending Physician: Aletta Edouard, MD  Discharge Diagnosis:  Principal Problem:   Candidal esophagitis Active Problems:   S/P kidney transplant   Diastolic CHF   CAD (coronary artery disease)   Hypertension   Gastritis determined by endoscopy  Discharge Medications:   Medication List    STOP taking these medications       docusate sodium 100 MG capsule  Commonly known as:  COLACE      TAKE these medications       acetaminophen 500 MG tablet  Commonly known as:  TYLENOL  Take 500 mg by mouth every 6 (six) hours as needed for mild pain.     allopurinol 100 MG tablet  Commonly known as:  ZYLOPRIM  Take 100 mg by mouth at bedtime.     amLODipine 10 MG tablet  Commonly known as:  NORVASC  Take 5 mg by mouth Daily.     aspirin EC 81 MG tablet  Take 81 mg by mouth daily.     cinacalcet 90 MG tablet  Commonly known as:  SENSIPAR  Take 90 mg by mouth daily.     colchicine 0.6 MG tablet  Take 0.6 mg by mouth daily as needed. For gout     famotidine 20 MG tablet  Commonly known as:  PEPCID  Take 20 mg by mouth 2 (two) times daily.     fluconazole 200 MG tablet  Commonly known as:  DIFLUCAN  Take 1 tablet (200 mg total) by mouth daily.     furosemide 80 MG tablet  Commonly known as:  LASIX  Take 80 mg by mouth 2 (two) times daily.     losartan 100 MG tablet  Commonly known as:  COZAAR  Take 100 mg by mouth daily.     metoprolol 50 MG tablet  Commonly known as:  LOPRESSOR  Take 100 mg by mouth 2 (two) times daily.     mycophenolate 250 MG capsule  Commonly known as:  CELLCEPT  Take 500 mg by mouth 2 (two) times daily.     pantoprazole 20 MG tablet  Commonly known as:  PROTONIX  Take 1 tablet (20 mg total) by mouth daily.     senna-docusate 8.6-50 MG per tablet  Commonly known as:   Senokot-S  Take 1 tablet by mouth daily as needed for mild constipation.     simvastatin 20 MG tablet  Commonly known as:  ZOCOR  Take 20 mg by mouth at bedtime.     tacrolimus 0.5 MG capsule  Commonly known as:  PROGRAF  Take 0.5 mg by mouth daily. Take 1 capsule by mouth every morning along with two of the 1mg  capsules for a total dose of 2.5mg      tacrolimus 1 MG capsule  Commonly known as:  PROGRAF  Take 2 mg by mouth 2 (two) times daily.        Disposition and follow-up:   [name] was discharged from [facility] in Good condition.  At the hospital follow up visit please address:  1. Candidal Esophagitis and resolution of symptoms of nausea, dysphagia, reflux, abdominal, vomiting or diarrhea. Please make sure the patient completes her course of Fluconazole (03/20/14 to 03/30/14).  2. Fluconazole is a CYP450 inhibitor and Prograf is normally metabolized by CYP450. We will need to  decrease her daily dose of Prograf in order to avoid Tacrolimus Toxicity. Patient will take Prograf 1.5 mg BID until she completes Fluconazole therapy and then 2 days after and then she will resume her normal dose on 04/02/2014. Her normal dose will need to be prescribed at her outpatient appointment. Please prescribe Prograf 2.5 mg in the morning and 2 mg at night starting on 04/02/2014. Please check a tacrolimus level while in clinic.  3. Patient has never had a colonoscopy. She denies any hematochezia, melena or weight loss. Please consider referring the patient to outpatient GI for  colonoscopy screening. Discharge Instructions   Call MD for:  persistant nausea and vomiting    Complete by:  As directed      Call MD for:  severe uncontrolled pain    Complete by:  As directed      Diet - low sodium heart healthy    Complete by:  As directed      Discharge instructions    Complete by:  As directed   Please complete your antifungal course of Fluconazole 200 mg one pill daily.      Increase activity slowly    Complete by:  As directed            Consultations: Treatment Team:  Theda Belfast, MD  Procedures Performed:  Ct Abdomen Pelvis Wo Contrast  03/12/2014   CLINICAL DATA:  Abdominal pain and vomiting and diarrhea; history of renal transplant in 2003  EXAM: CT ABDOMEN AND PELVIS WITHOUT CONTRAST  TECHNIQUE: Multidetector CT imaging of the abdomen and pelvis was performed following the standard protocol without IV contrast.  COMPARISON:  None.  FINDINGS: The gallbladder is mildly distended, exhibits no wall thickening, and contains tiny radiodense stones or sludge. The liver exhibits no focal mass or ductal dilation. The spleen, partially distended stomach, pancreas, and adrenal glands are normal. There is bilateral renal atrophy. There are hypodensities associated with both kidneys compatible with cysts. There is a transplanted kidney in the right iliac fossa. There is no significant hydronephrosis. The caliber of the abdominal aorta is normal. The periaortic and pericaval regions are normal.  There is no small or large bowel obstructive pattern. There is no evidence of enteritis. There are scattered colonic diverticula especially in the sigmoid. There is no evidence of acute diverticulitis. The appendix is not discretely demonstrated but no pericecal abnormality is demonstrated. The urinary bladder is moderately distended and grossly normal. The uterus is surgically absent. There is a structure in the left groin which may be patent dialysis graft or pseudoaneurysm but I have no clinical history in this regard.  There are degenerative changes of the lumbar spine and both hips. The lung bases exhibit fibrotic changes but no focal pneumonia.  IMPRESSION: 1. There is no evidence of gastroenteritis or diverticulitis nor other acute bowel abnormality. 2. There are gallstones in the gallbladder is mildly distended, but there is no objective evidence of acute cholecystitis. 3. There is  a transplanted kidney in the right iliac fossa. The native kidneys are atrophic and exhibit multiple presumed cysts. 4. There is a rim calcified structure in the left groin which may reflect a pseudoaneurysm. There are adjacent Gortex graft-like structures present. Correlation with the patient's clinical history and clinical exam in this regard is needed.   Electronically Signed   By: David  [place]   On: 03/12/2014 20:48   US Abdomen Limited Ruq  03/13/2014   CLINICAL DATA:  Nausea and vomiting, history of  renal transplant and gallstones  EXAM: US ABDOMEN LIMITED - RIGHT UPPER QUADRANT  COMPARISON:  None.  FINDINGS: Gallbladder:  There are numerous gallstones the largest measuring 2.2 cm. Gallbladder wall is not thickened and there is no Murphy's sign.  Common bile duct:  Diameter: 5 mm  Liver:  No focal lesion identified. Within normal limits in parenchymal echogenicity.  IMPRESSION: Cholelithiasis   Electronically Signed   By: Esperanza Heir [name]   On: 03/13/2014 15:52   Admission HPI: [name] is an 80 year old woman with history of focal sclerosing glomerulonephritis s/p kidney transplant in 2003, diastolic CHF, CAD, and HTN who presented with abdominal pain, nausea, and vomiting. Her abdominal pain started 2 days prior to admission. She described it as diffuse and worsening. She reported loose to watery diarrhea that started the previous night. She had had multiple bowel movements that morning. She denied hematochezia or melena. She had nausea and vomiting that started that morning which was bilious and nonbloody. She had had subjective fevers, chills, diaphoresis and decreased PO intake. She denied any triggering foods, but had eaten [employer] the day before. She had not had any recent travel and did not have any sick contacts. She had a similar episode in 2007 that was attributed to E. coli gastroenteritis. Patient did report lightheadedness, but denied any chest pain, shortness of breath, dysuria,  hematuria, rash, or edema.  Hospital Course by problem list:  Abdominal pain, N/V: Patient originally presented with 2 days of nausea, diffuse abdominal pain, watery diarrhea, and vomiting. Diarrhea and vomiting were non-bloody. CT abdomen/pelvis showed no evidence of gastroenteritis, diverticulitis or acute bowel abnormality. It did show gallstones but no acute cholecystitis. Urinalysis was negative for leukocytes or nitrites. Her lactic acid was normal and BMP continued to be unremarkable. The patient had stable vital signs and remained afebrile and without leukocytosis during her admission. We discussed the CT findings with a radiologist who suggested there was no evidence of cholecystitis, but recommended obtaining a RUQ [place]. RUQ US showed that there were numerous gallstones with the largest measuring 2.2 cm. The gallbladder wall was not thickened and there was no Murphy's sign. Of note, the patient has never had a colonoscopy. It would be important to set patient up with an outpatient GI consult after discharge. Dr. Darrick Penna, nephrology, was consulted due to the patient being a transplant patient and out of worry for tacrolimus toxicity. Dr. Darrick Penna did not believe her symptoms were due to Cellcept or Prograf, so we continued those medications. Her Prograf level came back at 2.1, lower than the therapeutic range (5 to 15), mostly likely because we initially held her Prograf. Initially, we thought her symptoms were most likely due to a viral gastroenteritis such as Norovirus or another virus. During her stay, the patient's abdominal pain improved, but she continued to have nausea, weakness and fatigue. We were able to advance her from NPO to a clear liquid diet which she tolerated. On attempt to advance her diet from clear to soft, patient barely ate, had increased nausea and two episodes of vomiting. On further discussion with the patient, she admitted to [place] that she had had a 6 month history of early  satiety. We consulted GI, who recommended a HIDA scan. HIDA scan was normal with normal EF. Dr. Elnoria Howard, gastroenterology, evaluated the patient and preformed an EGD which showed candidal esophagitis and mild fundal gastritis. Gastroenterology recommended Fluconazole 200 mg daily for 10 days. Her first dose was on 03/20/2014 and should be  continued until 03/30/2014. Fluconazole is a CYP450 inhibitor and Prograf is normally metabolized by CYP450. We will need to decrease her daily dose of Prograf in order to avoid Tacrolimus Toxicity. Patient will take Prograf 1.5 mg BID until she completes Fluconazole therapy and then 2 days after and then she will resume her normal dose on 04/02/2014. We also started her on Protonix 20 mg daily. Our patient will follow up in the Internal Medicine Clinic with Dr. Danella Pentonruong on 03/28/2014.   Focal sclerosing glomerulonephritis s/p kidney transplant in 2003: Patient is on Cellcept and Tacrolimus at home for her renal transplant. Our patient normally follows with Dr. Briant CedarMattingly as outpatient. We consulted Nephrology during her stay out of concern for Tacrolimus toxicity and also to make [facility] aware that their transplant patient was admitted to the hospital. She was seen by Dr. Darrick Pennaeterding. Their level of suspicion for Tacrolimus toxicity was low, and they recommended restarting her Tacrolimus and Cellcept. Her Tacrolimus level eventually came back low at 2.1, most likely due to the fact that we held her Tacrolimus upon admission. We restarted both Cellcept and Tacrolimus.  Fluconazole is a CYP450 inhibitor and Prograf is normally metabolized by CYP450. We will need to decrease her daily dose of Prograf in order to avoid Tacrolimus Toxicity. Patient will take Prograf 1.5 mg BID until she completes Fluconazole therapy and then 2 days after and then she will resume her normal dose on 04/02/2014. She was able to tolerate oral medications and was able to keep these medications down. Her  creatinine was stable during her stay and ranged from 0.8 to 1.1. Our patient will follow up with Dr. Briant CedarMattingly, Nephrology.   HTN: Patient has a history of hypertension for which she takes amlodipine 10 mg daily, losartan 100 mg daily and metoprolol 50 mg BID at home. We initially held her medications due to nausea and vomiting. Her blood pressure escalated during the course of her hospital stay into the 160s systolic. We added back her home medications, but held Lasix since the patient appeared hypovolemic/euvolemic. Her systolic blood pressure averaged in the 120s-150s.  Diastolic CHF, CAD: Last echocardiogram was on 07/09/13 which showed normal systolic function with EF of 60-65% and grade 2 diastolic dysfunction. Her troponins were negative x 3. Her EKG showed new slight ST depression in I, II, III, avf, and slight ST elevation in V1, V2, V3. She did not appear to be in acute exacerbation as she appears hypovolemic-euvolemic on exam. Her repeat EKG is similar to prior. She had no complaints of chest pain, swelling in her leg, weight gain or shortness of breath. We continued her on her losartan 100 mg daily, metoprolol 50 mg BID, simvastatin 20 mg daily, and asprin 81 mg daily. We held her furosemide 80 mg BID since patient appeared euvolemic/hypovolemic on exam. Patient did initially receive minimal IV fluids at the beginning of her stay until she was able to tolerate more liquids by mouth.   Discharge Vitals:   BP 143/62  Pulse 69  Temp(Src) 98 F (36.7 C) (Oral)  Resp 16  Ht 5\' 2"  (1.575 m)  Wt 166 lb 7.2 oz (75.501 kg)  BMI 30.44 kg/m2  SpO2 100%  Discharge Labs:  Results for orders placed during the hospital encounter of 03/12/14 (from the past 24 hour(s))  TSH     Status: None   Collection Time    03/20/14 11:35 AM      Result Value Ref Range   TSH 1.510  0.350 - 4.500 uIU/mL    Signed: Jill Alexanders, DO PGY-1 Internal Medicine Resident Pager # [phone] 03/21/2014 9:50  AM

## 2014-03-14 NOTE — Progress Notes (Signed)
Per Dr. Senaida Oresichardson, it's okay for patient to have ice chips and sips of ginger ale. Patient tolerated several sips of ginger ale without issue. Will continue to monitor.  Leanna BattlesEckelmann, Afshin Chrystal Eileen, RN.

## 2014-03-14 NOTE — Progress Notes (Signed)
Patient was ordered a clear liquid tray for lunch and dinner. Patient did not tolerate either meal. She said she's had several sips of ginger ale throughout the day, a few cups of ice, and a bite of jello. States she just doesn't feel like she's able to drink anything without feeling sick.  Leanna BattlesEckelmann, Evelen Vazguez Eileen, RN.

## 2014-03-15 DIAGNOSIS — A088 Other specified intestinal infections: Secondary | ICD-10-CM

## 2014-03-15 LAB — TACROLIMUS LEVEL: Tacrolimus (FK506) - LabCorp: 2.1 ng/mL

## 2014-03-15 LAB — RENAL FUNCTION PANEL
Albumin: 3.3 g/dL — ABNORMAL LOW (ref 3.5–5.2)
Anion gap: 15 (ref 5–15)
BUN: 18 mg/dL (ref 6–23)
CALCIUM: 9.6 mg/dL (ref 8.4–10.5)
CHLORIDE: 106 meq/L (ref 96–112)
CO2: 20 meq/L (ref 19–32)
Creatinine, Ser: 1.01 mg/dL (ref 0.50–1.10)
GFR calc Af Amer: 59 mL/min — ABNORMAL LOW (ref >90)
GFR calc non Af Amer: 51 mL/min — ABNORMAL LOW (ref >90)
GLUCOSE: 92 mg/dL (ref 70–99)
Phosphorus: 2.6 mg/dL (ref 2.3–4.6)
Potassium: 3.8 mEq/L (ref 3.7–5.3)
Sodium: 141 mEq/L (ref 137–147)

## 2014-03-15 MED ORDER — PROMETHAZINE HCL 25 MG/ML IJ SOLN: 12.5000 mg | Freq: Four times a day (QID) | INTRAMUSCULAR | Status: DC | PRN

## 2014-03-15 MED ORDER — AMLODIPINE BESYLATE 10 MG PO TABS
10.0000 mg | ORAL_TABLET | Freq: Every day | ORAL | Status: DC
  Administered 2014-03-15 – 2014-03-21 (×5): 10 mg via ORAL
  Filled 2014-03-15 (×7): qty 1

## 2014-03-15 MED ORDER — METOPROLOL TARTRATE 50 MG PO TABS
50.0000 mg | ORAL_TABLET | Freq: Two times a day (BID) | ORAL | Status: DC
  Administered 2014-03-15 – 2014-03-21 (×11): 50 mg via ORAL
  Filled 2014-03-15 (×14): qty 1

## 2014-03-15 MED ORDER — SODIUM CHLORIDE 0.9 % IV SOLN
INTRAVENOUS | Status: AC
  Administered 2014-03-15: 12:00:00 via INTRAVENOUS

## 2014-03-15 MED ORDER — LOSARTAN POTASSIUM 50 MG PO TABS
100.0000 mg | ORAL_TABLET | Freq: Every day | ORAL | Status: DC
  Administered 2014-03-15 – 2014-03-21 (×5): 100 mg via ORAL
  Filled 2014-03-15 (×7): qty 2

## 2014-03-15 NOTE — Progress Notes (Signed)
Internal Medicine Attending  Date: 03/15/2014  Patient name: [name] Medical record number: [id number] Date of birth: [birth year] Age: 80 y.o. Gender: female  I saw and evaluated the patient. I discussed patient and reviewed the resident's note by Dr. Senaida Oresichardson, and I agree with the resident's findings and plans as documented in her note, with the following additional comments.  Would watch blood pressure closely following resumption of her home antihypertensive medication and adjust as indicated.  If her nausea persists, then GI consult would be appropriate.

## 2014-03-15 NOTE — Progress Notes (Signed)
Subjective: Interval History: has complaints , stom quesy, but holding down meds and a few liquids.  Objective: Vital signs in last 24 hours: Temp:  [97.8 F (36.6 C)-98.4 F (36.9 C)] 97.8 F (36.6 C) (08/14 0602) Pulse Rate:  [78-83] 83 (08/14 0602) Resp:  [18] 18 (08/14 0602) BP: (152-170)/(63-82) 165/82 mmHg (08/14 0602) SpO2:  [97 %-100 %] 97 % (08/14 0602) Weight change:   Intake/Output from previous day: 08/13 0701 - 08/14 0700 In: 101.3 [I.V.:101.3] Out: -  Intake/Output this shift:    General appearance: alert, cooperative and no distress Resp: clear to auscultation bilaterally Cardio: S1, S2 normal and systolic murmur: holosystolic 2/6, blowing at apex GI: Renal TX RLQ, pos bs, mild tender diffusely Extremities: extremities normal, atraumatic, no cyanosis or edema  Lab Results:  Recent Labs  03/12/14 1539 03/13/14 0651  WBC 8.2 7.4  HGB 11.7* 11.7*  HCT 36.1 36.5  PLT 227 226   BMET:  Recent Labs  03/14/14 0730 03/15/14 0316  NA 141 141  K 4.0 3.8  CL 106 106  CO2 20 20  GLUCOSE 93 92  BUN 21 18  CREATININE 1.09 1.01  CALCIUM 10.0 9.6   No results found for this basename: PTH,  in the last 72 hours Iron Studies: No results found for this basename: IRON, TIBC, TRANSFERRIN, FERRITIN,  in the last 72 hours  Studies/Results: [place] Abdomen Limited Ruq  03/13/2014   CLINICAL DATA:  Nausea and vomiting, history of renal transplant and gallstones  EXAM: US ABDOMEN LIMITED - RIGHT UPPER QUADRANT  COMPARISON:  None.  FINDINGS: Gallbladder:  There are numerous gallstones the largest measuring 2.2 cm. Gallbladder wall is not thickened and there is no Murphy's sign.  Common bile duct:  Diameter: 5 mm  Liver:  No focal lesion identified. Within normal limits in parenchymal echogenicity.  IMPRESSION: Cholelithiasis   Electronically Signed   By: Esperanza Heiraymond  Rubner [name]   On: 03/13/2014 15:52    I have reviewed the patient's current medications.  Assessment/Plan: 1   Renal Tx Prograf low but missed for 36 h, now getting, repeat in 48h.   2 N,V viral studies pending , appears infectious 3 HTn rising BP now, can restart meds 4 HPTH hold off meds 5 Gout hold off meds Rec  Cont Prograf, mycophenolate, restart BP meds    LOS: 3 days   Kekai Geter L 03/15/2014,9:14 AM

## 2014-03-15 NOTE — Progress Notes (Signed)
Subjective:  Patient was seen and examined this morning. Patient states she feels just a little better. She states she still feels nauseous and she is dry heaving, but less than yesterday. She continues to feel fatigued and weak. She has been keeping her medications down. She can tolerate ginger-ale and sprite, but has not eaten the jello or soup yet because she said it didn't taste good. She's not hungry and she's not sure she could keep any solid food down yet. She denies any vomitus or diarrhea. Her abdominal pain has resolved. She denies fever or chills. She has not felt well enough to bathe yet, but states that she might try to do that today.  Objective: Vital signs in last 24 hours: Filed Vitals:   03/14/14 1746 03/14/14 2116 03/15/14 0602 03/15/14 1004  BP: 152/78 170/75 165/82 165/85  Pulse: 83 82 83 84  Temp: 98.4 F (36.9 C) 98.4 F (36.9 C) 97.8 F (36.6 C) 98.2 F (36.8 C)  TempSrc: Oral Oral Oral Oral  Resp: 18 18 18 18   Height:      Weight:      SpO2: 99% 100% 97% 100%   Weight change:   Intake/Output Summary (Last 24 hours) at 03/15/14 1145 Last data filed at 03/15/14 1010  Gross per 24 hour  Intake      3 ml  Output      0 ml  Net      3 ml   General: Vital signs reviewed.  Patient is well-developed and well-nourished, in mild distress, she will periodically sit up during our discussion and lean over the side of the bed to spit up into her cup.  Cardiovascular: RRR, S1 normal, S2 normal, no murmurs, gallops, or rubs. Pulmonary/Chest: Clear to auscultation bilaterally, no wheezes, rales, or rhonchi. Abdominal: Soft, non-tender to palpation, non-distended, decreased BS, no guarding present.  Musculoskeletal: No joint deformities, erythema, or stiffness, ROM full and nontender. Extremities: No lower extremity edema bilaterally,  pulses symmetric and intact bilaterally. No cyanosis or clubbing. Neurological: A&O x3, Strength is normal and symmetric bilaterally, no  focal motor deficit, sensory intact to light touch bilaterally.  Skin: Warm, dry and intact. No rashes or erythema. Psychiatric: Normal mood and affect. speech and behavior is normal. Cognition and memory are normal.    Lab Results: Basic Metabolic Panel:  Recent Labs Lab 03/14/14 0730 03/15/14 0316  NA 141 141  K 4.0 3.8  CL 106 106  CO2 20 20  GLUCOSE 93 92  BUN 21 18  CREATININE 1.09 1.01  CALCIUM 10.0 9.6  PHOS  --  2.6   Liver Function Tests:  Recent Labs Lab 03/12/14 1539 03/13/14 0651 03/15/14 0316  AST 19 16  --   ALT 7 5  --   ALKPHOS 137* 127*  --   BILITOT 0.6 0.5  --   PROT 7.1 6.7  --   ALBUMIN 3.5 3.3* 3.3*    Recent Labs Lab 03/12/14 1539  LIPASE 19   CBC:  Recent Labs Lab 03/12/14 1539 03/13/14 0651  WBC 8.2 7.4  NEUTROABS 7.1  --   HGB 11.7* 11.7*  HCT 36.1 36.5  MCV 88.7 91.3  PLT 227 226   Cardiac Enzymes:  Recent Labs Lab 03/12/14 1539 03/12/14 2355 03/13/14 0651  TROPONINI <0.30 <0.30 <0.30   Urinalysis:  Recent Labs Lab 03/12/14 2120  COLORURINE YELLOW  LABSPEC 1.015  PHURINE 7.0  GLUCOSEU NEGATIVE  HGBUR TRACE*  BILIRUBINUR NEGATIVE  KETONESUR 15*  PROTEINUR 30*  UROBILINOGEN 0.2  NITRITE NEGATIVE  LEUKOCYTESUR NEGATIVE   Studies/Results: US Abdomen Limited Ruq  03/13/2014   CLINICAL DATA:  Nausea and vomiting, history of renal transplant and gallstones  EXAM: US ABDOMEN LIMITED - RIGHT UPPER QUADRANT  COMPARISON:  None.  FINDINGS: Gallbladder:  There are numerous gallstones the largest measuring 2.2 cm. Gallbladder wall is not thickened and there is no Murphy's sign.  Common bile duct:  Diameter: 5 mm  Liver:  No focal lesion identified. Within normal limits in parenchymal echogenicity.  IMPRESSION: Cholelithiasis   Electronically Signed   By: Esperanza Heir [name]   On: 03/13/2014 15:52   Medications: I have reviewed the patient's current medications.  Prescriptions prior to admission  Medication Sig  Dispense Refill  . acetaminophen (TYLENOL) 500 MG tablet Take 500 mg by mouth every 6 (six) hours as needed for mild pain.      Marland Kitchen allopurinol (ZYLOPRIM) 100 MG tablet Take 100 mg by mouth at bedtime.      Marland Kitchen amLODipine (NORVASC) 10 MG tablet Take 5 mg by mouth Daily.       Marland Kitchen aspirin EC 81 MG tablet Take 81 mg by mouth daily.      . cinacalcet (SENSIPAR) 90 MG tablet Take 90 mg by mouth daily.      . colchicine 0.6 MG tablet Take 0.6 mg by mouth daily as needed. For gout      . docusate sodium (COLACE) 100 MG capsule Take 100 mg by mouth 2 (two) times daily as needed. For constipation      . famotidine (PEPCID) 20 MG tablet Take 20 mg by mouth 2 (two) times daily.      . furosemide (LASIX) 80 MG tablet Take 80 mg by mouth 2 (two) times daily.       Marland Kitchen losartan (COZAAR) 100 MG tablet Take 100 mg by mouth daily.      . metoprolol (LOPRESSOR) 50 MG tablet Take 100 mg by mouth 2 (two) times daily.      . mycophenolate (CELLCEPT) 250 MG capsule Take 500 mg by mouth 2 (two) times daily.      . simvastatin (ZOCOR) 20 MG tablet Take 20 mg by mouth at bedtime.       . tacrolimus (PROGRAF) 0.5 MG capsule Take 0.5 mg by mouth daily. Take 1 capsule by mouth every morning along with two of the 1mg  capsules for a total dose of 2.5mg       . tacrolimus (PROGRAF) 1 MG capsule Take 2 mg by mouth 2 (two) times daily.         Scheduled Meds: . amLODipine  10 mg Oral Daily  . heparin  5,000 Units Subcutaneous 3 times per day  . losartan  100 mg Oral Daily  . metoprolol  50 mg Oral BID  . mycophenolate  500 mg Oral BID  . promethazine  12.5 mg Intravenous Q6H  . sodium chloride  3 mL Intravenous Q12H  . tacrolimus  2 mg Oral QHS  . tacrolimus  2.5 mg Oral Daily   Continuous Infusions: . sodium chloride     PRN Meds:.gi cocktail, meclizine, ondansetron (ZOFRAN) IV Assessment/Plan:  Viral Gastroenteritis: Patient has had a negative work up so far other than cholelithiasis. Her symptoms are most likely 2/2 an  infectious etiology such as norovirus or another viral gastroenteritis, but we will monitor her and consider Atypical Presentation of Uncomplicated Gallstone Disease as the cause if she does not improve. Tacrolimus  level was low at 2.1 (normal range 5-15). Patient's abdominal pain had improved yesterday, but she continues to feel weak, fatigued and nauseous. She had been having less dry heaving. We started her on a clear liquid diet yesterday. She was able to drink ginger ale and sprite, but did not eat the jello or soup because "it didn't taste good." I would like to see if she can keep down some clear liquids today.  -clear liquid diet -phenergan 12.5 mg IV Q6H nausea  -Check EKG for prolonged QT due to scheduled phenergan regimen -CMV, stool pathogen panel pending, Fecal lactoferrin, C Dif PCR, EBV, BK virus, Stool culture pending -Gi cocktail prn, Zofran prn, Meclizine prn -NS IV 75 mL/hr for 8 hours  Diastolic CHF, CAD: Troponins negative x 3. EKG with new slight ST depression in I, II, III, avf, and slight ST elevation in V1, V2, V3. Does not appear to be in acute exacerbation as she appears hypovolemic-euvolemic on exam. Repeat EKG is similar to prior. -losartan 100mg    -metoprolol 50mg  BID -furosemide 80mg  BID held  -simvastatin 20mg  held  -ASA 81 held   HTN: We have been holding her blood pressure medications due to nausea and dry heaving. BP has been elevated in the 160s systolic. I will add back her amlodipine, losartan and metoprolol. -amlodipine 10mg    -losartan 100mg   -metoprolol 50mg  BID -furosemide 80mg  BID held   Focal sclerosing glomerulonephritis s/p kidney transplant in 2003: on Cellcept and tacro. The patient was seen by Dr. Darrick Pennaeterding. Patient normally follows with Dr. Briant CedarMattingly. Their level of suspicion for Tacrolimus toxicity is low, and they recommend restarting her Tacrolimus and Cellcept po. If po is not tolerated, we will switch to IV. Hold all other medications.  Patient states she tolerated her po medications yesterday and kept them down. -Tacrolimus level pending -Continue Cellcept and Tacrolimus po, if the patient is not tolerating, we will give these medications as prograf 2 mg IV BID and cellcept 500 mg BID IV.  Gout: chronic, stable  -allopurinol 100mg  held  -cochicine 0.6mg  held   FEN: Clear Liquid Diet   DVT ppx: subq hep 5000u TID  Dispo: Disposition is deferred at this time, awaiting improvement of current medical problems.  Anticipated discharge in approximately 1-2 day(s).   The patient does not have a current PCP (No primary provider on file.) and [facility].  The patient does not have transportation limitations that hinder transportation to clinic appointments.  .Services Needed at time of discharge: Y = Yes, Blank = No PT:   OT:   RN:   Equipment:   Other:     LOS: 3 days   Jill AlexandersAlexa Richardson, DO PGY-1 Internal Medicine Resident Pager # [phone] 03/15/2014 11:45 AM

## 2014-03-15 NOTE — Care Management Note (Signed)
CARE MANAGEMENT NOTE 03/15/2014  Patient:  [name], [name]   Account Number:  [id number]  Date Initiated:  03/15/2014  Documentation initiated by:  Shalita Notte  Subjective/Objective Assessment:   CM following for progression and d/c planning.     Action/Plan:   03/15/2014 Met with pt no d/c needs identified at this time, pt lives with grandson who is very support, plans to return to home. IM given and explained.   Anticipated DC Date:     Anticipated DC Plan:           Choice offered to / List presented to:             Status of service:   Medicare Important Message given?  YES (If response is "NO", the following Medicare IM given date fields will be blank) Date Medicare IM given:  03/15/2014 Medicare IM given by:  Sonal Dorwart Date Additional Medicare IM given:   Additional Medicare IM given by:    Discharge Disposition:    Per UR Regulation:    If discussed at Long Length of Stay Meetings, dates discussed:    Comments:

## 2014-03-16 LAB — HEPATIC FUNCTION PANEL
ALT: 6 U/L (ref 0–35)
AST: 29 U/L (ref 0–37)
Albumin: 3.2 g/dL — ABNORMAL LOW (ref 3.5–5.2)
Alkaline Phosphatase: 116 U/L (ref 39–117)
Bilirubin, Direct: <0.2 mg/dL (ref 0.0–0.3)
TOTAL PROTEIN: 6.4 g/dL (ref 6.0–8.3)
Total Bilirubin: 0.7 mg/dL (ref 0.3–1.2)

## 2014-03-16 LAB — RENAL FUNCTION PANEL
Albumin: 3.1 g/dL — ABNORMAL LOW (ref 3.5–5.2)
Anion gap: 13 (ref 5–15)
BUN: 15 mg/dL (ref 6–23)
CHLORIDE: 105 meq/L (ref 96–112)
CO2: 20 meq/L (ref 19–32)
Calcium: 9.9 mg/dL (ref 8.4–10.5)
Creatinine, Ser: 0.96 mg/dL (ref 0.50–1.10)
GFR calc non Af Amer: 54 mL/min — ABNORMAL LOW (ref >90)
GFR, EST AFRICAN AMERICAN: 63 mL/min — AB (ref >90)
GLUCOSE: 94 mg/dL (ref 70–99)
PHOSPHORUS: 2.2 mg/dL — AB (ref 2.3–4.6)
Potassium: 4.1 mEq/L (ref 3.7–5.3)
SODIUM: 138 meq/L (ref 137–147)

## 2014-03-16 LAB — EPSTEIN BARR VRS(EBV DNA BY PCR): EBV DNA QN by PCR: <200 copies/mL

## 2014-03-16 LAB — BK VIRUS QUANT PCR, URINE: BK virus DNA, quant PCR: NOT DETECTED copies/mL

## 2014-03-16 MED ORDER — SODIUM CHLORIDE 0.9 % IV SOLN
INTRAVENOUS | Status: AC
  Administered 2014-03-16: 09:00:00 via INTRAVENOUS

## 2014-03-16 MED ORDER — SODIUM CHLORIDE 0.9 % IV SOLN
INTRAVENOUS | Status: AC
  Administered 2014-03-16: 22:00:00 via INTRAVENOUS

## 2014-03-16 NOTE — Progress Notes (Signed)
Internal Medicine Attending  Date: 03/16/2014  Patient name: [name] Medical record number: [id number] Date of birth: 09/10/1931 Age: 80 y.o. Gender: female  I saw and evaluated the patient. I discussed patient and reviewed the resident's note by Dr. Senaida Oresichardson, and I agree with the resident's findings and plans as documented in her note.

## 2014-03-16 NOTE — Progress Notes (Signed)
Subjective: Interval History: has complaints ,still N but better. Holding down fluids and meds but not a lot.  Objective: Vital signs in last 24 hours: Temp:  [98.1 F (36.7 C)-98.3 F (36.8 C)] 98.3 F (36.8 C) (08/15 0613) Pulse Rate:  [65-84] 68 (08/15 0613) Resp:  [17-18] 17 (08/15 0613) BP: (140-165)/(68-85) 154/79 mmHg (08/15 0613) SpO2:  [97 %-100 %] 100 % (08/15 0613) Weight:  [75.388 kg (166 lb 3.2 oz)] 75.388 kg (166 lb 3.2 oz) (08/14 2150) Weight change:   Intake/Output from previous day: 08/14 0701 - 08/15 0700 In: 123 [P.O.:120; I.V.:3] Out: -  Intake/Output this shift:    General appearance: alert, cooperative and no distress Resp: clear to auscultation bilaterally Cardio: S1, S2 normal and systolic murmur: systolic ejection 2/6, decrescendo at 2nd left intercostal space GI: pos bs, TX RLQ normal size and nontender Extremities: extremities normal, atraumatic, no cyanosis or edema  Lab Results: No results found for this basename: WBC, HGB, HCT, PLT,  in the last 72 hours BMET:  Recent Labs  03/15/14 0316 03/16/14 0510  NA 141 138  K 3.8 4.1  CL 106 105  CO2 20 20  GLUCOSE 92 94  BUN 18 15  CREATININE 1.01 0.96  CALCIUM 9.6 9.9   No results found for this basename: PTH,  in the last 72 hours Iron Studies: No results found for this basename: IRON, TIBC, TRANSFERRIN, FERRITIN,  in the last 72 hours  Studies/Results: No results found.  I have reviewed the patient's current medications.  Assessment/Plan: 1  Renal TX stable, back on meds, tol immunosuppressives.  Vol ok.  2 HTn resume meds except diuretic 3 Anemia 4 HPTH keep of Sensipar at this time 5 gout not imp at this time resume soon 6 Abdm ?? Viral, PCRs not done yet.  May need GI eval and endo P Immunosuppressives, BP meds, ? GI eval    LOS: 4 days   Addaleigh Nicholls L 03/16/2014,9:12 AM

## 2014-03-16 NOTE — Progress Notes (Signed)
Subjective:  Patient was seen and examined this morning. Patient states that she feels much better. She stated when she first came into the hospital she felt at about a 0/10 (worst she has ever felt), but she has improved today to about a 7/10. Patient states she still has nausea, but she has not been dry heaving. She had one small loose stool last night. She denies any vomiting, fever or chills. Patient states that she tried some clear soup and crackers yesterday, which she tolerated, but she didn't eat much because she doesn't have much of an appetite. After drinking some water last night, she did have some epigastric pain that resolved.   Objective: Vital signs in last 24 hours: Filed Vitals:   03/15/14 1152 03/15/14 1436 03/15/14 2150 03/16/14 0613  BP: 155/68 140/75 152/71 154/79  Pulse: 78 65 67 68  Temp: 98.1 F (36.7 C)  98.2 F (36.8 C) 98.3 F (36.8 C)  TempSrc: Oral     Resp:   17 17  Height:      Weight:   75.388 kg (166 lb 3.2 oz)   SpO2: 99%  97% 100%   Weight change:   Intake/Output Summary (Last 24 hours) at 03/16/14 0802 Last data filed at 03/15/14 2244  Gross per 24 hour  Intake    123 ml  Output      0 ml  Net    123 ml   General: Vital signs reviewed.  Patient is well-developed and well-nourished, in no acute distress. Laying in bed comfortably. Looks younger than stated age. Cardiovascular: RRR, S1 normal, S2 normal, no murmurs, gallops, or rubs. Pulmonary/Chest: Clear to auscultation bilaterally, no wheezes, rales, or rhonchi. Abdominal: Soft, mildly tender to palpation in epigastric region, non-distended, decreased BS, no guarding present.  Musculoskeletal: No joint deformities, erythema, or stiffness, ROM full and nontender. Extremities: No lower extremity edema bilaterally,  pulses symmetric and intact bilaterally. No cyanosis or clubbing. Neurological: A&O x3, Strength is normal and symmetric bilaterally, no focal motor deficit, sensory intact to light  touch bilaterally.  Skin: Warm, dry and intact. No rashes or erythema. Psychiatric: Normal mood and affect. speech and behavior is normal. Cognition and memory are normal.    Lab Results: Basic Metabolic Panel:  Recent Labs Lab 03/15/14 0316 03/16/14 0510  NA 141 138  K 3.8 4.1  CL 106 105  CO2 20 20  GLUCOSE 92 94  BUN 18 15  CREATININE 1.01 0.96  CALCIUM 9.6 9.9  PHOS 2.6 2.2*   Liver Function Tests:  Recent Labs Lab 03/12/14 1539 03/13/14 0651 03/15/14 0316 03/16/14 0510  AST 19 16  --   --   ALT 7 5  --   --   ALKPHOS 137* 127*  --   --   BILITOT 0.6 0.5  --   --   PROT 7.1 6.7  --   --   ALBUMIN 3.5 3.3* 3.3* 3.1*    Recent Labs Lab 03/12/14 1539  LIPASE 19   CBC:  Recent Labs Lab 03/12/14 1539 03/13/14 0651  WBC 8.2 7.4  NEUTROABS 7.1  --   HGB 11.7* 11.7*  HCT 36.1 36.5  MCV 88.7 91.3  PLT 227 226   Cardiac Enzymes:  Recent Labs Lab 03/12/14 1539 03/12/14 2355 03/13/14 0651  TROPONINI <0.30 <0.30 <0.30   Urinalysis:  Recent Labs Lab 03/12/14 2120  COLORURINE YELLOW  LABSPEC 1.015  PHURINE 7.0  GLUCOSEU NEGATIVE  HGBUR TRACE*  BILIRUBINUR NEGATIVE  KETONESUR 15*  PROTEINUR 30*  UROBILINOGEN 0.2  NITRITE NEGATIVE  LEUKOCYTESUR NEGATIVE   Studies/Results: No results found. Medications: I have reviewed the patient's current medications.  Prescriptions prior to admission  Medication Sig Dispense Refill  . acetaminophen (TYLENOL) 500 MG tablet Take 500 mg by mouth every 6 (six) hours as needed for mild pain.      Marland Kitchen allopurinol (ZYLOPRIM) 100 MG tablet Take 100 mg by mouth at bedtime.      Marland Kitchen amLODipine (NORVASC) 10 MG tablet Take 5 mg by mouth Daily.       Marland Kitchen aspirin EC 81 MG tablet Take 81 mg by mouth daily.      . cinacalcet (SENSIPAR) 90 MG tablet Take 90 mg by mouth daily.      . colchicine 0.6 MG tablet Take 0.6 mg by mouth daily as needed. For gout      . docusate sodium (COLACE) 100 MG capsule Take 100 mg by mouth 2  (two) times daily as needed. For constipation      . famotidine (PEPCID) 20 MG tablet Take 20 mg by mouth 2 (two) times daily.      . furosemide (LASIX) 80 MG tablet Take 80 mg by mouth 2 (two) times daily.       Marland Kitchen losartan (COZAAR) 100 MG tablet Take 100 mg by mouth daily.      . metoprolol (LOPRESSOR) 50 MG tablet Take 100 mg by mouth 2 (two) times daily.      . mycophenolate (CELLCEPT) 250 MG capsule Take 500 mg by mouth 2 (two) times daily.      . simvastatin (ZOCOR) 20 MG tablet Take 20 mg by mouth at bedtime.       . tacrolimus (PROGRAF) 0.5 MG capsule Take 0.5 mg by mouth daily. Take 1 capsule by mouth every morning along with two of the 1mg  capsules for a total dose of 2.5mg       . tacrolimus (PROGRAF) 1 MG capsule Take 2 mg by mouth 2 (two) times daily.         Scheduled Meds: . amLODipine  10 mg Oral Daily  . heparin  5,000 Units Subcutaneous 3 times per day  . losartan  100 mg Oral Daily  . metoprolol  50 mg Oral BID  . mycophenolate  500 mg Oral BID  . sodium chloride  3 mL Intravenous Q12H  . tacrolimus  2 mg Oral QHS  . tacrolimus  2.5 mg Oral Daily   Continuous Infusions: . sodium chloride     PRN Meds:.gi cocktail, meclizine, ondansetron (ZOFRAN) IV, promethazine Assessment/Plan:  Viral Gastroenteritis: Patient's symptoms are most likely 2/2 an infectious etiology such as norovirus or another viral gastroenteritis. Given her improvement with conservative management, I believe this is the most likely etiology. I would like to continue to monitor the patient today and see how she tolerates food. I think it is less likely the patient's symptoms are due to an Atypical Presentation of Uncomplicated Cholelithiasis since she has improved, denies RUQ pain and had a normal hepatic function. If our patient is unable to tolerate food soon or if her pain worsens with eating or symptoms worsen in general, we will consider a GI consult for workup of cholelithiasis versus PUD. -Clear  liquid diet -Recheck hepatic function panel -phenergan 12.5 mg IV Q6H nausea  -CMV, stool pathogen panel pending, Fecal lactoferrin, C Dif PCR, EBV, BK virus, Stool culture pending -Gi cocktail prn, Zofran prn, Meclizine prn -NS 75 mL/hr  for 8 hours again today  Diastolic CHF, CAD: Troponins negative x 3. EKG with new slight ST depression in I, II, III, avf, and slight ST elevation in V1, V2, V3. Does not appear to be in acute exacerbation as she appears hypovolemic-euvolemic on exam. Repeat EKG is similar to prior. -losartan 100mg    -metoprolol 50mg  BID -furosemide 80mg  BID held  -simvastatin 20mg  held  -ASA 81 held   HTN: Continued her home medications, but holding furosemide due to hypovolemia/euvolemia. SBP 150s.  -amlodipine 10mg    -losartan 100mg   -metoprolol 50mg  BID -furosemide 80mg  BID held   Focal sclerosing glomerulonephritis s/p kidney transplant in 2003: Patient is on Cellcept and Tacrolimus. She is being followed by Nephrology. Tacrolimus level is low at 2.1. -Continue Cellcept and Tacrolimus po.  Gout: chronic, stable  -allopurinol 100mg  held  -cochicine 0.6mg  held   FEN: Clear Liquid Diet   DVT ppx: subq hep 5000u TID  Dispo: Disposition is deferred at this time, awaiting improvement of current medical problems.  Anticipated discharge in approximately 1-2 day(s).   The patient does not have a current PCP (No primary provider on file.) and [facility] after [facility].  The patient does not have transportation limitations that hinder transportation to clinic appointments.  .Services Needed at time of discharge: Y = Yes, Blank = No PT:   OT:   RN:   Equipment:   Other:     LOS: 4 days   Jill AlexandersAlexa Richardson, DO PGY-1 Internal Medicine Resident Pager # [phone] 03/16/2014 8:02 AM

## 2014-03-17 LAB — CLOSTRIDIUM DIFFICILE BY PCR: Toxigenic C. Difficile by PCR: NEGATIVE

## 2014-03-17 LAB — COMPREHENSIVE METABOLIC PANEL
ALT: 6 U/L (ref 0–35)
AST: 20 U/L (ref 0–37)
Albumin: 2.9 g/dL — ABNORMAL LOW (ref 3.5–5.2)
Alkaline Phosphatase: 114 U/L (ref 39–117)
Anion gap: 14 (ref 5–15)
BUN: 11 mg/dL (ref 6–23)
CO2: 17 mEq/L — ABNORMAL LOW (ref 19–32)
Calcium: 9.7 mg/dL (ref 8.4–10.5)
Chloride: 108 mEq/L (ref 96–112)
Creatinine, Ser: 0.86 mg/dL (ref 0.50–1.10)
GFR calc Af Amer: 72 mL/min — ABNORMAL LOW (ref >90)
GFR calc non Af Amer: 62 mL/min — ABNORMAL LOW (ref >90)
GLUCOSE: 85 mg/dL (ref 70–99)
Potassium: 4.4 mEq/L (ref 3.7–5.3)
SODIUM: 139 meq/L (ref 137–147)
Total Bilirubin: 0.7 mg/dL (ref 0.3–1.2)
Total Protein: 6 g/dL (ref 6.0–8.3)

## 2014-03-17 LAB — BASIC METABOLIC PANEL
Anion gap: 13 (ref 5–15)
BUN: 11 mg/dL (ref 6–23)
CALCIUM: 9.5 mg/dL (ref 8.4–10.5)
CO2: 18 meq/L — AB (ref 19–32)
CREATININE: 0.85 mg/dL (ref 0.50–1.10)
Chloride: 105 mEq/L (ref 96–112)
GFR calc Af Amer: 73 mL/min — ABNORMAL LOW (ref >90)
GFR calc non Af Amer: 63 mL/min — ABNORMAL LOW (ref >90)
GLUCOSE: 87 mg/dL (ref 70–99)
Potassium: 4.3 mEq/L (ref 3.7–5.3)
Sodium: 136 mEq/L — ABNORMAL LOW (ref 137–147)

## 2014-03-17 MED ORDER — SIMVASTATIN 20 MG PO TABS
20.0000 mg | ORAL_TABLET | Freq: Every day | ORAL | Status: DC
  Administered 2014-03-17 – 2014-03-20 (×4): 20 mg via ORAL
  Filled 2014-03-17 (×5): qty 1

## 2014-03-17 MED ORDER — ASPIRIN EC 81 MG PO TBEC
81.0000 mg | DELAYED_RELEASE_TABLET | Freq: Every day | ORAL | Status: DC
  Administered 2014-03-17 – 2014-03-21 (×5): 81 mg via ORAL
  Filled 2014-03-17 (×6): qty 1

## 2014-03-17 NOTE — Progress Notes (Signed)
Internal Medicine Attending  Date: 03/17/2014  Patient name: [name] Medical record number: [id number] Date of birth: 1932-03-23 Age: 80 y.o. Gender: female  I saw and evaluated the patient. I discussed patient and reviewed the resident's note by Dr. Senaida Oresichardson, and I agree with the resident's findings and plans as documented in her note.  Dr. Dalphine HandingBhardwaj will take over as attending physician tomorrow 03/18/2014.

## 2014-03-17 NOTE — Progress Notes (Signed)
Subjective: Interval History: has complaints better but still not normal. Some aching.  Objective: Vital signs in last 24 hours: Temp:  [97.6 F (36.4 C)-98.4 F (36.9 C)] 98.4 F (36.9 C) (08/16 0505) Pulse Rate:  [56-64] 56 (08/16 0505) Resp:  [17-18] 17 (08/16 0505) BP: (140-159)/(52-65) 140/52 mmHg (08/16 0505) SpO2:  [98 %-100 %] 99 % (08/16 0505) Weight:  [75.297 kg (166 lb)] 75.297 kg (166 lb) (08/15 2059) Weight change: -0.091 kg (-3.2 oz)  Intake/Output from previous day: 08/15 0701 - 08/16 0700 In: 510 [I.V.:510] Out: -  Intake/Output this shift:    General appearance: facial edema Resp: clear to auscultation bilaterally Cardio: S1, S2 normal GI: soft, non-tender; bowel sounds normal; no masses,  no organomegaly and renal TX RLQ normal size and nontender., liver down 4 cm Extremities: edema 1+_  Lab Results: No results found for this basename: WBC, HGB, HCT, PLT,  in the last 72 hours BMET:  Recent Labs  03/15/14 0316 03/16/14 0510  NA 141 138  K 3.8 4.1  CL 106 105  CO2 20 20  GLUCOSE 92 94  BUN 18 15  CREATININE 1.01 0.96  CALCIUM 9.6 9.9   No results found for this basename: PTH,  in the last 72 hours Iron Studies: No results found for this basename: IRON, TIBC, TRANSFERRIN, FERRITIN,  in the last 72 hours  Studies/Results: No results found.  I have reviewed the patient's current medications. Prior to Admission:  Prescriptions prior to admission  Medication Sig Dispense Refill  . acetaminophen (TYLENOL) 500 MG tablet Take 500 mg by mouth every 6 (six) hours as needed for mild pain.      Marland Kitchen allopurinol (ZYLOPRIM) 100 MG tablet Take 100 mg by mouth at bedtime.      Marland Kitchen amLODipine (NORVASC) 10 MG tablet Take 5 mg by mouth Daily.       Marland Kitchen aspirin EC 81 MG tablet Take 81 mg by mouth daily.      . cinacalcet (SENSIPAR) 90 MG tablet Take 90 mg by mouth daily.      . colchicine 0.6 MG tablet Take 0.6 mg by mouth daily as needed. For gout      . docusate  sodium (COLACE) 100 MG capsule Take 100 mg by mouth 2 (two) times daily as needed. For constipation      . famotidine (PEPCID) 20 MG tablet Take 20 mg by mouth 2 (two) times daily.      . furosemide (LASIX) 80 MG tablet Take 80 mg by mouth 2 (two) times daily.       Marland Kitchen losartan (COZAAR) 100 MG tablet Take 100 mg by mouth daily.      . metoprolol (LOPRESSOR) 50 MG tablet Take 100 mg by mouth 2 (two) times daily.      . mycophenolate (CELLCEPT) 250 MG capsule Take 500 mg by mouth 2 (two) times daily.      . simvastatin (ZOCOR) 20 MG tablet Take 20 mg by mouth at bedtime.       . tacrolimus (PROGRAF) 0.5 MG capsule Take 0.5 mg by mouth daily. Take 1 capsule by mouth every morning along with two of the 1mg  capsules for a total dose of 2.5mg       . tacrolimus (PROGRAF) 1 MG capsule Take 2 mg by mouth 2 (two) times daily.         Assessment/Plan: 1  Renal TX stable, holding down meds 2 N,V, D slow improvement.  BK and EBV neg.  Need to consider ENDO if not any better in am 3 HTN meds 4 HTPH off meds 5 Gout Rec D/C IVF, follow Cr. If not better in am, GI to see and consider ENDO    LOS: 5 days   Rebecka Oelkers L 03/17/2014,9:25 AM

## 2014-03-17 NOTE — Progress Notes (Addendum)
Subjective:  Patient was seen and examined this morning. Patient states that she is feeling much better. She did have an episode of emesis yesterday, which was really more of her "spitting up." She states that her nausea has resolved. She denies any fever, chills, abdominal pain or diarrhea. She had one bowel movement yesterday. Patient still feels weak and fatigued, but she has not eaten much. She states she would like to try a soft diet.   Objective: Vital signs in last 24 hours: Filed Vitals:   03/16/14 0613 03/16/14 1809 03/16/14 2059 03/17/14 0505  BP: 154/79 152/65 159/56 140/52  Pulse: 68 64 61 56  Temp: 98.3 F (36.8 C) 97.6 F (36.4 C) 98.3 F (36.8 C) 98.4 F (36.9 C)  TempSrc:  Oral    Resp: 17 18 17 17   Height:      Weight:   75.297 kg (166 lb)   SpO2: 100% 100% 98% 99%   Weight change: -0.091 kg (-3.2 oz)  Intake/Output Summary (Last 24 hours) at 03/17/14 0940 Last data filed at 03/16/14 1614  Gross per 24 hour  Intake    510 ml  Output      0 ml  Net    510 ml   General: Vital signs reviewed.  Patient is well-developed and well-nourished, in no acute distress. Laying in bed comfortably. Looks younger than stated age. Cardiovascular: RRR, S1 normal, S2 normal, no murmurs, gallops, or rubs. Pulmonary/Chest: Clear to auscultation bilaterally, no wheezes, rales, or rhonchi. Abdominal: Soft, mildly tender to palpation in RUQ and epigastric region, non-distended, decreased BS, no guarding present.  Musculoskeletal: No joint deformities, erythema, or stiffness, ROM full and nontender. Extremities:Trace edema in lower extremities bilaterally, no calf tenderness bilaterally,  pulses symmetric and intact bilaterally. No cyanosis or clubbing. Neurological: A&O x3, Strength is normal and symmetric bilaterally, no focal motor deficit, sensory intact to light touch bilaterally.  Skin: Warm, dry and intact. No rashes or erythema. Psychiatric: Normal mood and affect. speech  and behavior is normal. Cognition and memory are normal.    Lab Results: Basic Metabolic Panel:  Recent Labs Lab 03/15/14 0316 03/16/14 0510  NA 141 138  K 3.8 4.1  CL 106 105  CO2 20 20  GLUCOSE 92 94  BUN 18 15  CREATININE 1.01 0.96  CALCIUM 9.6 9.9  PHOS 2.6 2.2*   Liver Function Tests:  Recent Labs Lab 03/13/14 0651 03/15/14 0316 03/16/14 0510  AST 16  --  29  ALT 5  --  6  ALKPHOS 127*  --  116  BILITOT 0.5  --  0.7  PROT 6.7  --  6.4  ALBUMIN 3.3* 3.3* 3.1*  3.2*    Recent Labs Lab 03/12/14 1539  LIPASE 19   CBC:  Recent Labs Lab 03/12/14 1539 03/13/14 0651  WBC 8.2 7.4  NEUTROABS 7.1  --   HGB 11.7* 11.7*  HCT 36.1 36.5  MCV 88.7 91.3  PLT 227 226   Cardiac Enzymes:  Recent Labs Lab 03/12/14 1539 03/12/14 2355 03/13/14 0651  TROPONINI <0.30 <0.30 <0.30   Urinalysis:  Recent Labs Lab 03/12/14 2120  COLORURINE YELLOW  LABSPEC 1.015  PHURINE 7.0  GLUCOSEU NEGATIVE  HGBUR TRACE*  BILIRUBINUR NEGATIVE  KETONESUR 15*  PROTEINUR 30*  UROBILINOGEN 0.2  NITRITE NEGATIVE  LEUKOCYTESUR NEGATIVE   Medications: I have reviewed the patient's current medications.  Prescriptions prior to admission  Medication Sig Dispense Refill  . acetaminophen (TYLENOL) 500 MG tablet Take 500  mg by mouth every 6 (six) hours as needed for mild pain.      Marland Kitchen allopurinol (ZYLOPRIM) 100 MG tablet Take 100 mg by mouth at bedtime.      Marland Kitchen amLODipine (NORVASC) 10 MG tablet Take 5 mg by mouth Daily.       Marland Kitchen aspirin EC 81 MG tablet Take 81 mg by mouth daily.      . cinacalcet (SENSIPAR) 90 MG tablet Take 90 mg by mouth daily.      . colchicine 0.6 MG tablet Take 0.6 mg by mouth daily as needed. For gout      . docusate sodium (COLACE) 100 MG capsule Take 100 mg by mouth 2 (two) times daily as needed. For constipation      . famotidine (PEPCID) 20 MG tablet Take 20 mg by mouth 2 (two) times daily.      . furosemide (LASIX) 80 MG tablet Take 80 mg by mouth 2  (two) times daily.       Marland Kitchen losartan (COZAAR) 100 MG tablet Take 100 mg by mouth daily.      . metoprolol (LOPRESSOR) 50 MG tablet Take 100 mg by mouth 2 (two) times daily.      . mycophenolate (CELLCEPT) 250 MG capsule Take 500 mg by mouth 2 (two) times daily.      . simvastatin (ZOCOR) 20 MG tablet Take 20 mg by mouth at bedtime.       . tacrolimus (PROGRAF) 0.5 MG capsule Take 0.5 mg by mouth daily. Take 1 capsule by mouth every morning along with two of the 70m capsules for a total dose of 2.522m     . tacrolimus (PROGRAF) 1 MG capsule Take 2 mg by mouth 2 (two) times daily.         Scheduled Meds: . amLODipine  10 mg Oral Daily  . aspirin EC  81 mg Oral Daily  . heparin  5,000 Units Subcutaneous 3 times per day  . losartan  100 mg Oral Daily  . metoprolol  50 mg Oral BID  . mycophenolate  500 mg Oral BID  . simvastatin  20 mg Oral QHS  . sodium chloride  3 mL Intravenous Q12H  . tacrolimus  2 mg Oral QHS  . tacrolimus  2.5 mg Oral Daily   Continuous Infusions:   PRN Meds:.gi cocktail, meclizine, ondansetron (ZOFRAN) IV, promethazine Assessment/Plan:  Viral Gastroenteritis: Patient's symptoms are most likely 2/2 an infectious etiology such as norovirus or another viral gastroenteritis. Given her improvement with conservative management, I believe this is the most likely etiology. Due to her resolution of symptoms, but continuation of fatigue and weakness, I think it is less likely the patient's symptoms were due to an Atypical Presentation of Uncomplicated Cholelithiasis or PUD. Patient still has mild tenderness in epigastric and RUQ regions. Her hepatic function remains normal. AST 29, ALT 6, Alk Phos 116 on 03/16/14. EBV DNA was <200, no BK virus DNA was detected, CMV pending.  I have encouraged the patient to eat some today. She has requested a soft diet. Patient is improving, but I would like to see her be able to eat some food and regain strength before she is discharged. If patient  is unable to tolerate food, we will consider GI consult. -Soft diet -CMV, stool pathogen panel pending, Fecal lactoferrin, C Dif PCR, Stool culture pending -Phenergan prn, Gi cocktail prn, Zofran prn, Meclizine prn  Diastolic CHF, CAD: Troponins negative x 3. EKG with new slight  ST depression in I, II, III, avf, and slight ST elevation in V1, V2, V3. Does not appear to be in acute exacerbation as she appears hypovolemic-euvolemic on exam. Repeat EKG is similar to prior. No complaints of chest pain or shortness of breath. -losartan 163m   -metoprolol 537mBID -simvastatin 2084mASA 81  -furosemide 48m67mD HELD   HTN: Continued her home medications, but holding furosemide due to hypovolemia/euvolemia. SBP 140s-150s.  -amlodipine 10mg12mlosartan 100mg 62mtoprolol 50mg B42mfurosemide 48mg BI25mLD  Focal sclerosing glomerulonephritis s/p kidney transplant in 2003: Patient is on Cellcept and Tacrolimus. She is being followed by Nephrology. Tacrolimus level is low at 2.1. -Continue Cellcept and Tacrolimus po.  Gout: chronic, stable  -allopurinol 100mg hel72mcochicine 0.6mg held 57mEN: Soft Diet  DVT ppx: subq hep 5000u TID  Dispo: Disposition is deferred at this time, awaiting improvement of current medical problems.  Anticipated discharge in approximately 1-2 day(s).   The patient does not have a current PCP (No primary provider on file.) and [facility].  The patient does not have transportation limitations that hinder transportation to clinic appointments.  .Services Needed at time of discharge: Y = Yes, Blank = No PT:   OT:   RN:   Equipment:   Other:     LOS: 5 days   Briteny Fulghum RichOsa Craver Internal Medicine Resident Pager # [phone] 9:40 AM

## 2014-03-18 ENCOUNTER — Inpatient Hospital Stay (HOSPITAL_COMMUNITY): Payer: Medicare Other

## 2014-03-18 LAB — GI PATHOGEN PANEL BY PCR, STOOL
C difficile toxin A/B: NEGATIVE
Campylobacter by PCR: NEGATIVE
Cryptosporidium by PCR: NEGATIVE
E coli (ETEC) LT/ST: NEGATIVE
E coli (STEC): NEGATIVE
E coli 0157 by PCR: NEGATIVE
G lamblia by PCR: NEGATIVE
NOROVIRUS G1/G2: NEGATIVE
ROTAVIRUS A BY PCR: NEGATIVE
SALMONELLA BY PCR: NEGATIVE
Shigella by PCR: NEGATIVE

## 2014-03-18 LAB — CBC WITH DIFFERENTIAL/PLATELET
BASOS PCT: 0 % (ref 0–1)
Basophils Absolute: 0 10*3/uL (ref 0.0–0.1)
EOS PCT: 5 % (ref 0–5)
Eosinophils Absolute: 0.3 10*3/uL (ref 0.0–0.7)
HEMATOCRIT: 36.3 % (ref 36.0–46.0)
Hemoglobin: 11.9 g/dL — ABNORMAL LOW (ref 12.0–15.0)
LYMPHS PCT: 27 % (ref 12–46)
Lymphs Abs: 1.5 10*3/uL (ref 0.7–4.0)
MCH: 29.2 pg (ref 26.0–34.0)
MCHC: 32.8 g/dL (ref 30.0–36.0)
MCV: 89.2 fL (ref 78.0–100.0)
MONO ABS: 0.6 10*3/uL (ref 0.1–1.0)
Monocytes Relative: 10 % (ref 3–12)
Neutro Abs: 3.3 10*3/uL (ref 1.7–7.7)
Neutrophils Relative %: 58 % (ref 43–77)
Platelets: 212 10*3/uL (ref 150–400)
RBC: 4.07 MIL/uL (ref 3.87–5.11)
RDW: 13.2 % (ref 11.5–15.5)
WBC: 5.7 10*3/uL (ref 4.0–10.5)

## 2014-03-18 LAB — BASIC METABOLIC PANEL
Anion gap: 12 (ref 5–15)
BUN: 10 mg/dL (ref 6–23)
CO2: 19 meq/L (ref 19–32)
CREATININE: 0.9 mg/dL (ref 0.50–1.10)
Calcium: 9.8 mg/dL (ref 8.4–10.5)
Chloride: 103 mEq/L (ref 96–112)
GFR calc Af Amer: 68 mL/min — ABNORMAL LOW (ref >90)
GFR calc non Af Amer: 59 mL/min — ABNORMAL LOW (ref >90)
GLUCOSE: 87 mg/dL (ref 70–99)
Potassium: 4.3 mEq/L (ref 3.7–5.3)
Sodium: 134 mEq/L — ABNORMAL LOW (ref 137–147)

## 2014-03-18 MED ORDER — SINCALIDE 5 MCG IJ SOLR
INTRAMUSCULAR | Status: AC
  Administered 2014-03-18: 1.5 ug via INTRAVENOUS
  Filled 2014-03-18: qty 5

## 2014-03-18 MED ORDER — SINCALIDE 5 MCG IJ SOLR
0.0200 ug/kg | Freq: Once | INTRAMUSCULAR | Status: AC
  Administered 2014-03-18: 1.5 ug via INTRAVENOUS
  Filled 2014-03-18: qty 5

## 2014-03-18 MED ORDER — TECHNETIUM TC 99M MEBROFENIN IV KIT
5.0000 | PACK | Freq: Once | INTRAVENOUS | Status: AC | PRN
  Administered 2014-03-18: 5 via INTRAVENOUS

## 2014-03-18 MED ORDER — STERILE WATER FOR INJECTION IJ SOLN
INTRAMUSCULAR | Status: AC
  Administered 2014-03-18: 10 mL
  Filled 2014-03-18: qty 10

## 2014-03-18 NOTE — Progress Notes (Addendum)
  PROGRESS NOTE MEDICINE TEACHING ATTENDING   Day 6 of stay Patient name: [name]   Medical record number: [id number] Date of birth: Mar 10, 1932   I saw [name] with the entire team on morning rounds.   [name] is an 80 year old lady with histody of FSGS s/p kidney transplant in 2003, diastolic CHF, HTN, who presented with abdominal pain, diarrhea, nausea and vomiting. She was initially treated for gastroenteritis, however given her immunosuppressed status, CMV (pending), BK virus (negative), EBV (neagtive) were done which were negative. On chemistries, the patient has been mildly hyponatremic, never hyperkalemic, and shows within normal range kidney function. Liver function tests were significant for raised Alkaline phosphatase which has trended to normal; hypoalbuminemia; normal lipase; normal AST, ALT and bilirubin. Hematology showed no leucocytosis but some neutrophilia on admission. CT scan of her abdomen on admission - relevant findings were - no evidence of gastroenteritis, or other acute bowel abnormality. It showed some gallstones in a mildly distended bladder. DIarrhea studies - CDiff was negative. Tacrolimus levels do not indicate toxicity.  Today the patient has no diarrhea or vomiting, however nausea with diffusely tender abdominal pain persists. She is unable to eat anything (for the past 6 months) due to feeling nauseated despite medications. Her exam is significant for:  Alert, oriented patient.  Regular rhythm, systolic murmur + Soft non distended but diffusely tender abdomen which the patient does not let examine No pedal edema.   Assessment - I agree with the plan of the case so far. Today, in teh wake of ongoing symptoms and inability to eat, we will do a HIDA scan and consult GI regarding possible cholecystitis, peptic ulcer disease or is it possible that these are continued GI side effects of tacrolimus. I have read Dr Berenda Moraleichardson's note from today and I agree with her exam,  assessment and management. I have discussed the care of this patient with my IM team residents. Please see the resident note for details.  [name] 03/18/2014, 11:51 AM.

## 2014-03-18 NOTE — Progress Notes (Addendum)
Subjective:  Patient was seen and examined this morning. Patient states she's still not feeling well. She feels weak, fatigued, nauseous with a decreased appetite. She hasn't eaten much, but states when she does eat, she has increased nausea. She denies any fever, chills, vomiting, diarrhea, or abdominal pain. Patient has never had a colonoscopy.  Objective: Vital signs in last 24 hours: Filed Vitals:   03/17/14 1024 03/17/14 1840 03/17/14 2010 03/18/14 0517  BP: 148/66 133/47 155/65 134/85  Pulse: 62 84 90 57  Temp: 98.7 F (37.1 C) 98.7 F (37.1 C) 98.4 F (36.9 C) 98.6 F (37 C)  TempSrc: Oral Oral    Resp: _0 Height:      Weight:   75.751 kg (167 lb)   SpO2: 100% 99% 100% 100%   Weight change: 0.454 kg (1 lb)  Intake/Output Summary (Last 24 hours) at 03/18/14 0830 Last data filed at 03/18/14 0538  Gross per 24 hour  Intake    120 ml  Output      0 ml  Net    120 ml   General: Vital signs reviewed.  Patient is well-developed and well-nourished, in mild distress.  Cardiovascular: RRR, S1 normal, S2 normal, no murmurs, gallops, or rubs. Pulmonary/Chest: Clear to auscultation bilaterally, no wheezes, rales, or rhonchi. Abdominal: Soft, mildly tender to palpation in epigastric region, non-distended, decreased BS, no guarding present.  Musculoskeletal: No joint deformities, erythema, or stiffness, ROM full and nontender. Extremities:Trace edema in lower extremities bilaterally, no calf tenderness bilaterally,  pulses symmetric and intact bilaterally. No cyanosis or clubbing. Neurological: A&O x3, Strength is normal and symmetric bilaterally, no focal motor deficit, sensory intact to light touch bilaterally.  Skin: Warm, dry and intact. No rashes or erythema. Psychiatric: Normal mood and affect. speech and behavior is normal. Cognition and memory are normal.    Lab Results: Basic Metabolic Panel:  Recent Labs Lab 03/15/14 0316 03/16/14 0510  03/17/14 1250  03/18/14 0530  NA 141 138  < > 136* 134*  K 3.8 4.1  < > 4.3 4.3  CL 106 105  < > 105 103  CO2 20 20  < > 18* 19  GLUCOSE 92 94  < > 87 87  BUN 18 15  < > 11 10  CREATININE 1.01 0.96  < > 0.85 0.90  CALCIUM 9.6 9.9  < > 9.5 9.8  PHOS 2.6 2.2*  --   --   --   < > = values in this interval not displayed. Liver Function Tests:  Recent Labs Lab 03/16/14 0510 03/17/14 0812  AST 29 20  ALT 6 6  ALKPHOS 116 114  BILITOT 0.7 0.7  PROT 6.4 6.0  ALBUMIN 3.1*  3.2* 2.9*    Recent Labs Lab 03/12/14 1539  LIPASE 19   CBC:  Recent Labs Lab 03/12/14 1539 03/13/14 0651  WBC 8.2 7.4  NEUTROABS 7.1  --   HGB 11.7* 11.7*  HCT 36.1 36.5  MCV 88.7 91.3  PLT 227 226   Cardiac Enzymes:  Recent Labs Lab 03/12/14 1539 03/12/14 2355 03/13/14 0651  TROPONINI <0.30 <0.30 <0.30   Urinalysis:  Recent Labs Lab 03/12/14 2120  COLORURINE YELLOW  LABSPEC 1.015  PHURINE 7.0  GLUCOSEU NEGATIVE  HGBUR TRACE*  BILIRUBINUR NEGATIVE  KETONESUR 15*  PROTEINUR 30*  UROBILINOGEN 0.2  NITRITE NEGATIVE  LEUKOCYTESUR NEGATIVE   Medications: I have reviewed the patient's current medications.  Prescriptions prior to admission  Medication Sig Dispense  Refill  . acetaminophen (TYLENOL) 500 MG tablet Take 500 mg by mouth every 6 (six) hours as needed for mild pain.      Marland Kitchen allopurinol (ZYLOPRIM) 100 MG tablet Take 100 mg by mouth at bedtime.      Marland Kitchen amLODipine (NORVASC) 10 MG tablet Take 5 mg by mouth Daily.       Marland Kitchen aspirin EC 81 MG tablet Take 81 mg by mouth daily.      . cinacalcet (SENSIPAR) 90 MG tablet Take 90 mg by mouth daily.      . colchicine 0.6 MG tablet Take 0.6 mg by mouth daily as needed. For gout      . docusate sodium (COLACE) 100 MG capsule Take 100 mg by mouth 2 (two) times daily as needed. For constipation      . famotidine (PEPCID) 20 MG tablet Take 20 mg by mouth 2 (two) times daily.      . furosemide (LASIX) 80 MG tablet Take 80 mg by mouth 2 (two) times daily.        Marland Kitchen losartan (COZAAR) 100 MG tablet Take 100 mg by mouth daily.      . metoprolol (LOPRESSOR) 50 MG tablet Take 100 mg by mouth 2 (two) times daily.      . mycophenolate (CELLCEPT) 250 MG capsule Take 500 mg by mouth 2 (two) times daily.      . simvastatin (ZOCOR) 20 MG tablet Take 20 mg by mouth at bedtime.       . tacrolimus (PROGRAF) 0.5 MG capsule Take 0.5 mg by mouth daily. Take 1 capsule by mouth every morning along with two of the 41m capsules for a total dose of 2.570m     . tacrolimus (PROGRAF) 1 MG capsule Take 2 mg by mouth 2 (two) times daily.         Scheduled Meds: . amLODipine  10 mg Oral Daily  . aspirin EC  81 mg Oral Daily  . heparin  5,000 Units Subcutaneous 3 times per day  . losartan  100 mg Oral Daily  . metoprolol  50 mg Oral BID  . mycophenolate  500 mg Oral BID  . simvastatin  20 mg Oral QHS  . sodium chloride  3 mL Intravenous Q12H  . tacrolimus  2 mg Oral QHS  . tacrolimus  2.5 mg Oral Daily   Continuous Infusions:   PRN Meds:.gi cocktail, meclizine, ondansetron (ZOFRAN) IV, promethazine Assessment/Plan:  Viral Gastroenteritis: Patient's symptoms were thought to be most likely 2/2 an infectious etiology such as norovirus or another viral gastroenteritis. However, given her continuation of nausea, decreased appetite and inability to tolerate a soft diet, we will pursue a GI consult and work up for Atypical Presentation of Uncomplicated Cholelithiasis versus PUD. Patient still has mild tenderness in epigastric region. Patient has been afebrile. Her hepatic function remains normal. AST 29, ALT 6, Alk Phos 116 on 03/16/14. EBV DNA was <200, no BK virus DNA was detected, CMV pending. C. Diff negative. Dr. HuMat CarneGI, was consulted who recommended obtaining a HIDA scan.  -NPO for HIDA, will resume soft diet based on HIDA results -CMV, stool pathogen panel pending, Fecal lactoferrin, Stool culture pending -Phenergan prn, Gi cocktail prn, Zofran prn, Meclizine prn -GI  has been consulted -HIDA scan with measurement of EF  Diastolic CHF, CAD: Troponins negative x 3. EKG with new slight ST depression in I, II, III, avf, and slight ST elevation in V1, V2, V3. Does not appear to  be in acute exacerbation as she appears hypovolemic-euvolemic on exam. Repeat EKG is similar to prior. No complaints of chest pain or shortness of breath. -losartan 169m   -metoprolol 517mBID -simvastatin 2022mASA 81  -furosemide 32m41mD HELD   HTN: Continued her home medications, but holding furosemide due to hypovolemia/euvolemia. SBP 130s-150s.  -amlodipine 10mg60mlosartan 100mg 53mtoprolol 50mg B61mfurosemide 32mg BI93mLD  Focal sclerosing glomerulonephritis s/p kidney transplant in 2003: Patient is on Cellcept and Tacrolimus. She is being followed by Nephrology. Tacrolimus level is low at 2.1. -Continue Cellcept and Tacrolimus po.  Gout: chronic, stable  -allopurinol 100mg hel7mcochicine 0.6mg held 43mEN: Soft Diet  DVT ppx: subq hep 5000u TID  Dispo: Disposition is deferred at this time, awaiting improvement of current medical problems.  Anticipated discharge in approximately 1-2 day(s).   The patient does not have a current PCP (No primary provider on file.) and [facility].  The patient does not have transportation limitations that hinder transportation to clinic appointments.  .Services Needed at time of discharge: Y = Yes, Blank = No PT:   OT:   RN:   Equipment:   Other:     LOS: 6 days   Alexa RichOsa Craver Internal Medicine Resident Pager # [phone] 8:30 AM

## 2014-03-18 NOTE — Progress Notes (Signed)
CARE MANAGEMENT NOTE 03/18/2014  Patient:  [name],[name]   Account Number:  [id number] Date Additional Medicare IM given:  03/18/2014 Additional Medicare IM given by:  Isidoro DonningALESIA Lidia Clavijo

## 2014-03-18 NOTE — Progress Notes (Signed)
HIDA scan ordered for today. Verified with nuclear medicine that patient must be STRICT NPO - including NO pills. Ok to give IV zofran or phenergan, if needed. Scan to be done at approx 3pm because patient had some sips of liquid with breakfast this morning. Dr. Senaida Oresichardson aware of meds being held until after scan. Patient informed/educated. Will monitor.  Leanna BattlesEckelmann, Marti Acebo Eileen, RN.

## 2014-03-18 NOTE — Progress Notes (Signed)
Reason for Consult: hx of FSGS s/p renal transplant Referring Physician: ALLEA KASSNER is an 80 y.o. female.  HPI: 80 year old with a PMH of CAD, dHF, HTN, FSGS s/p renal transplant in 2003 and gout admitted to [facility] on 08/12 with c/o abd pain/N/V.  Nephrology following since  08/12.  Interval hx:  No improvement in symptoms.  She reports early satiety x 6 months.  N/V/D is new.  Cr stable.  She reports good UOP.  Pt NPO for HIDA scan today.  Trend in Creatinine: Creatinine, Ser  Date/Time Value Ref Range Status  03/18/2014  5:30 AM 0.90  0.50 - 1.10 mg/dL Final  1/61/0960 45:40 PM 0.85  0.50 - 1.10 mg/dL Final  9/81/1914  7:82 AM 0.86  0.50 - 1.10 mg/dL Final  9/56/2130  8:65 AM 0.96  0.50 - 1.10 mg/dL Final  7/84/6962  9:52 AM 1.01  0.50 - 1.10 mg/dL Final  8/41/3244  0:10 AM 1.09  0.50 - 1.10 mg/dL Final  2/72/5366  4:40 AM 1.09  0.50 - 1.10 mg/dL Final  3/47/4259  5:63 PM 1.01  0.50 - 1.10 mg/dL Final  87/56/4332  9:51 AM 1.72* 0.50 - 1.10 mg/dL Final  88/11/1658 63:01 PM 1.76* 0.50 - 1.10 mg/dL Final  60/08/930  3:55 AM 1.56* 0.50 - 1.10 mg/dL Final  73/09/2023  4:27 AM 1.62* 0.50 - 1.10 mg/dL Final  01/01/3761  8:31 AM 1.25* 0.50 - 1.10 mg/dL Final  51/01/6159  7:37 PM 1.12* 0.50 - 1.10 mg/dL Final  08/07/2692  8:54 AM 1.50* 0.50 - 1.10 mg/dL Final  01/26/349  0:93 AM 1.28* 0.50 - 1.10 mg/dL Final  03/19/2992  7:16 AM 1.34* 0.50 - 1.10 mg/dL Final  9/67/8938  1:01 AM 1.43* 0.50 - 1.10 mg/dL Final  7/51/0258  5:27 AM 1.42* 0.50 - 1.10 mg/dL Final  7/82/4235  3:61 AM 1.27* 0.50 - 1.10 mg/dL Final  4/43/1540  0:86 AM 1.34* 0.50 - 1.10 mg/dL Final  7/61/9509  3:26 PM 1.33* 0.50 - 1.10 mg/dL Final  02/11/4579  9:98 PM 1.40* 0.50 - 1.10 mg/dL Final  10/02/8248 53:97 AM 1.28* 0.50 - 1.10 mg/dL Final    PMH:   Past Medical History  Diagnosis Date  . S/P kidney transplant 2003    due to FSGN, follows with Dr. Briant Cedar  . Coronary artery disease   . Hypertension   . Cholelithiasis    . Diastolic CHF   . Gout     unconfirmed by joint aspiration  . Chronic kidney disease     PSH:   Past Surgical History  Procedure Laterality Date  . Kidney transplant    . Abdominal hysterectomy      Allergies:  Allergies  Allergen Reactions  . Sulfa Drugs Cross Reactors Swelling    Medications:   Prior to Admission medications   Medication Sig Start Date End Date Taking? Authorizing Provider  acetaminophen (TYLENOL) 500 MG tablet Take 500 mg by mouth every 6 (six) hours as needed for mild pain.   Yes Historical Provider, MD  allopurinol (ZYLOPRIM) 100 MG tablet Take 100 mg by mouth at bedtime.   Yes Historical Provider, MD  amLODipine (NORVASC) 10 MG tablet Take 5 mg by mouth Daily.  04/28/12  Yes Historical Provider, MD  aspirin EC 81 MG tablet Take 81 mg by mouth daily.   Yes Historical Provider, MD  cinacalcet (SENSIPAR) 90 MG tablet Take 90 mg by mouth daily.   Yes Historical Provider, MD  colchicine 0.6 MG tablet Take 0.6 mg by mouth daily as needed. For gout 04/26/12  Yes Almyra DeforestJaseela Illath, MD  docusate sodium (COLACE) 100 MG capsule Take 100 mg by mouth 2 (two) times daily as needed. For constipation   Yes Historical Provider, MD  famotidine (PEPCID) 20 MG tablet Take 20 mg by mouth 2 (two) times daily.   Yes Historical Provider, MD  furosemide (LASIX) 80 MG tablet Take 80 mg by mouth 2 (two) times daily.    Yes Historical Provider, MD  losartan (COZAAR) 100 MG tablet Take 100 mg by mouth daily.   Yes Historical Provider, MD  metoprolol (LOPRESSOR) 50 MG tablet Take 100 mg by mouth 2 (two) times daily. 07/11/13  Yes Marrian SalvageJacquelyn S Gill, MD  mycophenolate (CELLCEPT) 250 MG capsule Take 500 mg by mouth 2 (two) times daily.   Yes Historical Provider, MD  simvastatin (ZOCOR) 20 MG tablet Take 20 mg by mouth at bedtime.    Yes Historical Provider, MD  tacrolimus (PROGRAF) 0.5 MG capsule Take 0.5 mg by mouth daily. Take 1 capsule by mouth every morning along with two of the 1mg   capsules for a total dose of 2.5mg    Yes Historical Provider, MD  tacrolimus (PROGRAF) 1 MG capsule Take 2 mg by mouth 2 (two) times daily.    Yes Historical Provider, MD    Inpatient medications: . amLODipine  10 mg Oral Daily  . aspirin EC  81 mg Oral Daily  . heparin  5,000 Units Subcutaneous 3 times per day  . losartan  100 mg Oral Daily  . metoprolol  50 mg Oral BID  . mycophenolate  500 mg Oral BID  . simvastatin  20 mg Oral QHS  . sodium chloride  3 mL Intravenous Q12H  . tacrolimus  2 mg Oral QHS  . tacrolimus  2.5 mg Oral Daily    Discontinued Meds:   Medications Discontinued During This Encounter  Medication Reason  . acetaminophen (TYLENOL) 325 MG tablet Entry Error  . cinacalcet (SENSIPAR) 30 MG tablet Entry Error  . losartan (COZAAR) 50 MG tablet Entry Error  . HYDROcodone-acetaminophen (NORCO/VICODIN) 5-325 MG per tablet Entry Error  . metoprolol (LOPRESSOR) 50 MG tablet   . colchicine 0.6 MG tablet   . promethazine (PHENERGAN) suppository 12.5 mg   . 0.9 %  sodium chloride infusion   . promethazine (PHENERGAN) injection 12.5 mg   . promethazine (PHENERGAN) injection 12.5 mg     Social History:  reports that she quit smoking about 44 years ago. She has never used smokeless tobacco. She reports that she does not drink alcohol or use illicit drugs.  Family History:  History reviewed. No pertinent family history.  Weight change: 0.454 kg (1 lb)  Intake/Output Summary (Last 24 hours) at 03/18/14 1028 Last data filed at 03/18/14 0538  Gross per 24 hour  Intake    120 ml  Output      0 ml  Net    120 ml   BP 156/63  Pulse 73  Temp(Src) 98.1 F (36.7 C) (Oral)  Resp 16  Ht 5\' 2"  (1.575 m)  Wt 75.751 kg (167 lb)  BMI 30.54 kg/m2  SpO2 97% Filed Vitals:   03/17/14 1840 03/17/14 2010 03/18/14 0517 03/18/14 0940  BP: 133/47 155/65 134/85 156/63  Pulse: 84 90 57 73  Temp: 98.7 F (37.1 C) 98.4 F (36.9 C) 98.6 F (37 C) 98.1 F (36.7 C)  TempSrc: Oral    Oral  Resp:  18 18 17 16   Height:      Weight:  75.751 kg (167 lb)    SpO2: 99% 100% 100% 97%     General: resting in bed in NAD Cardiac: RRR, no rubs, murmurs or gallops Pulm: clear to auscultation bilaterally, moving normal volumes of air Abd: soft, nondistended, BS present, diffusely TTP Ext: warm and well perfused, no pedal edema Neuro: alert and oriented X3, responding appropriately   Labs: Basic Metabolic Panel:  Recent Labs Lab 03/12/14 1539 03/13/14 0651 03/14/14 0730 03/15/14 0316 03/16/14 0510 03/17/14 0812 03/17/14 1250 03/18/14 0530  NA 139 140 141 141 138 139 136* 134*  K 4.0 4.1 4.0 3.8 4.1 4.4 4.3 4.3  CL 103 103 106 106 105 108 105 103  CO2 20 19 20 20 20  17* 18* 19  GLUCOSE 138* 106* 93 92 94 85 87 87  BUN 19 19 21 18 15 11 11 10   CREATININE 1.01 1.09 1.09 1.01 0.96 0.86 0.85 0.90  ALBUMIN 3.5 3.3*  --  3.3* 3.1*  3.2* 2.9*  --   --   CALCIUM 9.6 9.6 10.0 9.6 9.9 9.7 9.5 9.8  PHOS  --   --   --  2.6 2.2*  --   --   --    Liver Function Tests:  Recent Labs Lab 03/13/14 0651 03/15/14 0316 03/16/14 0510 03/17/14 0812  AST 16  --  29 20  ALT 5  --  6 6  ALKPHOS 127*  --  116 114  BILITOT 0.5  --  0.7 0.7  PROT 6.7  --  6.4 6.0  ALBUMIN 3.3* 3.3* 3.1*  3.2* 2.9*    Recent Labs Lab 03/12/14 1539  LIPASE 19   CBC:  Recent Labs Lab 03/12/14 1539 03/13/14 0651  WBC 8.2 7.4  NEUTROABS 7.1  --   HGB 11.7* 11.7*  HCT 36.1 36.5  MCV 88.7 91.3  PLT 227 226   PT/INR: @LABRCNTIP (inr:5) Cardiac Enzymes: ) Recent Labs Lab 03/12/14 1539 03/12/14 2355 03/13/14 0651  TROPONINI <0.30 <0.30 <0.30    Assessment/Plan: 1. Renal transplant - Cr stable.  Continue Prograf and Cellcept.  We will continue to follow Cr.  2. N/V/D - per primary, plan for HIDA today and GI consult 3. HTN and dHF - per primary   Evelena Peat 03/18/2014, 10:28 AM   I have seen and examined this patient and agree with plan as outlined by Dr. Andrey Campanile.  Mrs Talamante  relates a history of dysphagia for the last 6 months along with early satiety.  Given her h/o immunosuppression would strongly recommend GI evaluation and EGD to evaluate her dysphagia and FTT. Faylinn Schwenn A,MD 03/18/2014 2:00 PM

## 2014-03-19 DIAGNOSIS — R110 Nausea: Secondary | ICD-10-CM

## 2014-03-19 DIAGNOSIS — R141 Gas pain: Secondary | ICD-10-CM

## 2014-03-19 DIAGNOSIS — R142 Eructation: Secondary | ICD-10-CM

## 2014-03-19 DIAGNOSIS — R143 Flatulence: Secondary | ICD-10-CM

## 2014-03-19 LAB — FECAL LACTOFERRIN, QUANT: Fecal Lactoferrin: NEGATIVE

## 2014-03-19 NOTE — Progress Notes (Signed)
  PROGRESS NOTE MEDICINE TEACHING ATTENDING   Day 7 of stay Patient name: [name]   Medical record number: [id number] Date of birth: [birth year]   Patient seen, feels better today, had some breakfast without vomiting, however feels bloated and full. Abdominal pain is less, on exam minimal tenderness and no distention. Discussed care with Dr Zada GirtKazibwe and Dr Senaida Oresichardson. HIDA negative. We continue supportive treatment and await GI consult for their input.  Please see the resident note for details.  Deyton Ellenbecker 03/19/2014, 2:36 PM.

## 2014-03-19 NOTE — Progress Notes (Signed)
S:Still feels nauseous, full breakfast tray at bedside.  No diarrhea in several days.    O:BP 162/71  Pulse 63  Temp(Src) 98.6 F (37 C) (Oral)  Resp 16  Ht 5\' 2"  (1.575 m)  Wt 75.5 kg (166 lb 7.2 oz)  BMI 30.44 kg/m2  SpO2 100% No intake or output data in the 24 hours ending 03/19/14 0759 Intake/Output: I/O last 3 completed shifts: In: 120 [P.O.:120] Out: -   Intake/Output this shift:    Weight change: -0.251 kg (-8.8 oz) KZS:WFUXNATGen:resting in bed, in NAD, cooperative with exam CVS:RRR, no m/r/g Resp:CTA B/L Abd:+ BS, soft, ND, diffusely TTP Ext:no edema, non-tender, warm and well perfused  Lab Results  Component Value Date   CREATININE 0.90 03/18/2014   CREATININE 0.85 03/17/2014   CREATININE 0.86 03/17/2014    Recent Labs Lab 03/12/14 1539 03/13/14 0651 03/14/14 0730 03/15/14 0316 03/16/14 0510 03/17/14 0812 03/17/14 1250 03/18/14 0530  NA 139 140 141 141 138 139 136* 134*  K 4.0 4.1 4.0 3.8 4.1 4.4 4.3 4.3  CL 103 103 106 106 105 108 105 103  CO2 20 19 20 20 20  17* 18* 19  GLUCOSE 138* 106* 93 92 94 85 87 87  BUN 19 19 21 18 15 11 11 10   CREATININE 1.01 1.09 1.09 1.01 0.96 0.86 0.85 0.90  ALBUMIN 3.5 3.3*  --  3.3* 3.1*  3.2* 2.9*  --   --   CALCIUM 9.6 9.6 10.0 9.6 9.9 9.7 9.5 9.8  PHOS  --   --   --  2.6 2.2*  --   --   --   AST 19 16  --   --  29 20  --   --   ALT 7 5  --   --  6 6  --   --    Liver Function Tests:  Recent Labs Lab 03/13/14 0651 03/15/14 0316 03/16/14 0510 03/17/14 0812  AST 16  --  29 20  ALT 5  --  6 6  ALKPHOS 127*  --  116 114  BILITOT 0.5  --  0.7 0.7  PROT 6.7  --  6.4 6.0  ALBUMIN 3.3* 3.3* 3.1*  3.2* 2.9*    Recent Labs Lab 03/12/14 1539  LIPASE 19   No results found for this basename: AMMONIA,  in the last 168 hours CBC:  Recent Labs Lab 03/12/14 1539 03/13/14 0651 03/18/14 1240  WBC 8.2 7.4 5.7  NEUTROABS 7.1  --  3.3  HGB 11.7* 11.7* 11.9*  HCT 36.1 36.5 36.3  MCV 88.7 91.3 89.2  PLT 227 226 212    Cardiac Enzymes:  Recent Labs Lab 03/12/14 1539 03/12/14 2355 03/13/14 0651  TROPONINI <0.30 <0.30 <0.30   CBG: No results found for this basename: GLUCAP,  in the last 168 hours  Iron Studies: No results found for this basename: IRON, TIBC, TRANSFERRIN, FERRITIN,  in the last 72 hours Studies/Results: Nm Hepato W/eject Fract  03/18/2014   CLINICAL DATA:  Cholelithiasis, abdominal pain  EXAM: NUCLEAR MEDICINE HEPATOBILIARY IMAGING WITH GALLBLADDER EF  TECHNIQUE: Sequential images of the abdomen were obtained out to 60 minutes following intravenous administration of radiopharmaceutical. After slow intravenous infusion of 1.5 micrograms Cholecystokinin, gallbladder ejection fraction was determined.  RADIOPHARMACEUTICALS:  5.0 Millicurie Tc-4958m Choletec  COMPARISON:  CT scan 03/12/2014  FINDINGS: There is normal uptake of the tracer by the liver. Gallbladder and CBD visualized at 25 min. Bowel activity noted at 30 min. Post CCK  gallbladder ejection fraction is 95.6%. At 30 min, normal ejection fraction is greater than 30%.  The patient did not experience symptoms during CCK infusion.  IMPRESSION: No cystic duct obstruction. Post CCK gallbladder ejection fraction is 95.6%.   Electronically Signed   By: Natasha Mead [name]   On: 03/18/2014 16:50   . amLODipine  10 mg Oral Daily  . aspirin EC  81 mg Oral Daily  . heparin  5,000 Units Subcutaneous 3 times per day  . losartan  100 mg Oral Daily  . metoprolol  50 mg Oral BID  . mycophenolate  500 mg Oral BID  . simvastatin  20 mg Oral QHS  . sodium chloride  3 mL Intravenous Q12H  . tacrolimus  2 mg Oral QHS  . tacrolimus  2.5 mg Oral Daily    BMET    Component Value Date/Time   NA 134* 03/18/2014 0530   K 4.3 03/18/2014 0530   CL 103 03/18/2014 0530   CO2 19 03/18/2014 0530   GLUCOSE 87 03/18/2014 0530   BUN 10 03/18/2014 0530   CREATININE 0.90 03/18/2014 0530   CALCIUM 9.8 03/18/2014 0530   GFRNONAA 59* 03/18/2014 0530   GFRAA 68* 03/18/2014  0530   CBC    Component Value Date/Time   WBC 5.7 03/18/2014 1240   RBC 4.07 03/18/2014 1240   HGB 11.9* 03/18/2014 1240   HCT 36.3 03/18/2014 1240   PLT 212 03/18/2014 1240   MCV 89.2 03/18/2014 1240   MCH 29.2 03/18/2014 1240   MCHC 32.8 03/18/2014 1240   RDW 13.2 03/18/2014 1240   LYMPHSABS 1.5 03/18/2014 1240   MONOABS 0.6 03/18/2014 1240   EOSABS 0.3 03/18/2014 1240   BASOSABS 0.0 03/18/2014 1240     Assessment/Plan:  1. Renal transplant - Cr stable as of 08/17. No labs were done today.  Continue Prograf and Cellcept. Continue to monitor renal function.  2. N/V/D - per primary and GI 3. HTN and dHF - per primary 4. Dypshagia/early satiety- agree with GI eval and EGD.  Has been going on for 6 months per pt report and cont to have issues with swallowing.  Doubt this is related to cholelithiasis as it sounds more mechanical by history.   Evelena Peat, DO [facility], PGY2 [phone] I have seen and examined this patient and agree with plan as outlined by Dr. Andrey Campanile with above additions. Jillian Warth A,MD 03/19/2014 10:00 AM

## 2014-03-19 NOTE — Progress Notes (Signed)
Subjective: [name] is a 80 yo female on hospital day 7 admitted for RUQ abdominal pain, nausea, vomiting with a history of focal sclerosing glomerulonephritis, kidney transplant, CHF, CAD, and HTN. She continues to have nausea and abdominal pain. She describes feeling the need to "belch" / "pass gas" and states that she thinks doing so would help her to feel much better. Patient was able to eat breakfast this morning but has not been ambulating much.   Objective: Vital signs in last 24 hours: Filed Vitals:   03/18/14 1707 03/18/14 2100 03/19/14 0516 03/19/14 0914  BP: 180/73 122/54 162/71 132/51  Pulse: 82 63 63 70  Temp: 97.6 F (36.4 C) 97.8 F (36.6 C) 98.6 F (37 C) 98.3 F (36.8 C)  TempSrc: Oral Oral Oral Oral  Resp: 18 18 16 18   Height:      Weight:  75.5 kg (166 lb 7.2 oz)    SpO2: 100% 100% 100% 100%   Weight change: -0.251 kg (-8.8 oz)  Intake/Output Summary (Last 24 hours) at 03/19/14 0919 Last data filed at 03/19/14 0914  Gross per 24 hour  Intake    240 ml  Output      0 ml  Net    240 ml   BP 132/51  Pulse 70  Temp(Src) 98.3 F (36.8 C) (Oral)  Resp 18  Ht 5\' 2"  (1.575 m)  Wt 75.5 kg (166 lb 7.2 oz)  BMI 30.44 kg/m2  SpO2 100% General appearance: alert and cooperative Lungs: clear to auscultation bilaterally Heart: regular rate and rhythm, S1, S2 normal, no murmur, click, rub or gallop Abdomen: tender RUQ, no guarding Extremities: extremities normal, atraumatic, no cyanosis or edema Lab Results: Basic Metabolic Panel:  Recent Labs Lab 03/15/14 0316 03/16/14 0510  03/17/14 1250 03/18/14 0530  NA 141 138  < > 136* 134*  K 3.8 4.1  < > 4.3 4.3  CL 106 105  < > 105 103  CO2 20 20  < > 18* 19  GLUCOSE 92 94  < > 87 87  BUN 18 15  < > 11 10  CREATININE 1.01 0.96  < > 0.85 0.90  CALCIUM 9.6 9.9  < > 9.5 9.8  PHOS 2.6 2.2*  --   --   --   < > = values in this interval not displayed. Liver Function Tests:  Recent Labs Lab 03/16/14 0510  03/17/14 0812  AST 29 20  ALT 6 6  ALKPHOS 116 114  BILITOT 0.7 0.7  PROT 6.4 6.0  ALBUMIN 3.1*  3.2* 2.9*    Recent Labs Lab 03/12/14 1539  LIPASE 19   No results found for this basename: AMMONIA,  in the last 168 hours CBC:  Recent Labs Lab 03/12/14 1539 03/13/14 0651 03/18/14 1240  WBC 8.2 7.4 5.7  NEUTROABS 7.1  --  3.3  HGB 11.7* 11.7* 11.9*  HCT 36.1 36.5 36.3  MCV 88.7 91.3 89.2  PLT 227 226 212   Cardiac Enzymes:  Recent Labs Lab 03/12/14 1539 03/12/14 2355 03/13/14 0651  TROPONINI <0.30 <0.30 <0.30   Urinalysis:  Recent Labs Lab 03/12/14 2120  COLORURINE YELLOW  LABSPEC 1.015  PHURINE 7.0  GLUCOSEU NEGATIVE  HGBUR TRACE*  BILIRUBINUR NEGATIVE  KETONESUR 15*  PROTEINUR 30*  UROBILINOGEN 0.2  NITRITE NEGATIVE  LEUKOCYTESUR NEGATIVE   Misc. Labs:   Micro Results: Recent Results (from the past 240 hour(s))  CLOSTRIDIUM DIFFICILE BY PCR     Status: None   Collection  Time    03/17/14  8:24 AM      Result Value Ref Range Status   C difficile by pcr NEGATIVE  NEGATIVE Final  STOOL CULTURE     Status: None   Collection Time    03/17/14  8:24 AM      Result Value Ref Range Status   Specimen Description STOOL   Final   Special Requests NONE   Final   Culture     Final   Value: Culture reincubated for better growth     Performed at [facility]   Report Status PENDING   Incomplete   Studies/Results: Nm Hepato W/eject Fract  03/18/2014   CLINICAL DATA:  Cholelithiasis, abdominal pain  EXAM: NUCLEAR MEDICINE HEPATOBILIARY IMAGING WITH GALLBLADDER EF  TECHNIQUE: Sequential images of the abdomen were obtained out to 60 minutes following intravenous administration of radiopharmaceutical. After slow intravenous infusion of 1.5 micrograms Cholecystokinin, gallbladder ejection fraction was determined.  RADIOPHARMACEUTICALS:  5.0 Millicurie Tc-4m Choletec  COMPARISON:  CT scan 03/12/2014  FINDINGS: There is normal uptake of the tracer by  the liver. Gallbladder and CBD visualized at 25 min. Bowel activity noted at 30 min. Post CCK gallbladder ejection fraction is 95.6%. At 30 min, normal ejection fraction is greater than 30%.  The patient did not experience symptoms during CCK infusion.  IMPRESSION: No cystic duct obstruction. Post CCK gallbladder ejection fraction is 95.6%.   Electronically Signed   By: Natasha Mead [name]   On: 03/18/2014 16:50   Medications: I have reviewed the patient's current medications. Scheduled Meds: . amLODipine  10 mg Oral Daily  . aspirin EC  81 mg Oral Daily  . heparin  5,000 Units Subcutaneous 3 times per day  . losartan  100 mg Oral Daily  . metoprolol  50 mg Oral BID  . mycophenolate  500 mg Oral BID  . simvastatin  20 mg Oral QHS  . sodium chloride  3 mL Intravenous Q12H  . tacrolimus  2 mg Oral QHS  . tacrolimus  2.5 mg Oral Daily   Continuous Infusions:  PRN Meds:.gi cocktail, meclizine, ondansetron (ZOFRAN) IV, promethazine Assessment/Plan: Principal Problem:   Vomiting and diarrhea Active Problems:   S/P kidney transplant   Diastolic CHF   CAD (coronary artery disease)   Hypertension  Abd Pain: Given [name] abd pain has not yet resolved and U/S and CT imaging has demonstrated cholelithiasis GI has been consulted to evaluate her for an EGD procedure. They have been notified of this this morning and plan to see her in the afternoon. If they deem the procedure necessary they will make her NPO. Her pain is currently well controlled without the need for medication.  Gas: [name] also complained of feeling the need to pass gas and/or belch. In order to facilitate this the medical student or nursing staff will come by and help her to walk some this afternoon.  Nausea: Continue IV zofran as scheduled for nausea  Hypertension: Blood pressures overnight reached 180/73 and 162/71 this morning. The nursing staff was asked to proceed with giving her her current BP regimen and her values  have since dropped to 132/51.  This is a Psychologist, occupational Note.  The care of the patient was discussed with Dr. Senaida [name] and the assessment and plan formulated with their assistance.  Please see their attached note for official documentation of the daily encounter.   LOS: 7 days   Chiquita Loth, Med Student 03/19/2014, 9:19 AM

## 2014-03-19 NOTE — Progress Notes (Signed)
Patient wants to talk with Dr.Hung about risks and benefits of EGD before signing consent form.Patient stated ,"He explained to me what he was going to and that's all.I would like to talk to him again before signing anything." Will report off to day shift nurse consent still needs to be signed.

## 2014-03-19 NOTE — Progress Notes (Addendum)
Subjective:  [name] was seen and examined this morning. [name] states she feel better than yesterday, but still continues to have decreased appetite, nausea, fatigue and weakness. She had one episode of vomiting last night. She reports she thinks she would feel better if she could belch. She was able to eat a few bites of her breakfast and keep her food down. She reports adequate fluid intake. She denies any fever, chills, abdominal pain or diarrhea. [name] has had decreased appetite over the past 6 months. She has never had a colonoscopy or endoscopy.  Objective: Vital signs in last 24 hours: Filed Vitals:   03/18/14 1707 03/18/14 2100 03/19/14 0516 03/19/14 0914  BP: 180/73 122/54 162/71 132/51  Pulse: 82 63 63 70  Temp: 97.6 F (36.4 C) 97.8 F (36.6 C) 98.6 F (37 C) 98.3 F (36.8 C)  TempSrc: Oral Oral Oral Oral  Resp: 18 18 16 18   Height:      Weight:  166 lb 7.2 oz (75.5 kg)    SpO2: 100% 100% 100% 100%   Weight change: -8.8 oz (-0.251 kg)  Intake/Output Summary (Last 24 hours) at 03/19/14 1610 Last data filed at 03/19/14 0914  Gross per 24 hour  Intake    240 ml  Output      0 ml  Net    240 ml   General: Vital signs reviewed.  [name] is well-developed and well-nourished, in mild distress.  Cardiovascular: RRR, S1 normal, S2 normal, no murmurs, gallops, or rubs. Pulmonary/Chest: Clear to auscultation bilaterally, no wheezes, rales, or rhonchi. Abdominal: Soft, mildly tender to palpation in epigastric region, non-distended, decreased BS, no guarding present.  Musculoskeletal: No joint deformities, erythema, or stiffness, ROM full and nontender. Extremities:Trace edema in lower extremities bilaterally, no calf tenderness bilaterally,  pulses symmetric and intact bilaterally. No cyanosis or clubbing. Neurological: A&O x3, Strength is normal and symmetric bilaterally, no focal motor deficit, sensory intact to light touch bilaterally.  Skin: Warm, dry and intact. No  rashes or erythema. Psychiatric: Normal mood and affect. speech and behavior is normal. Cognition and memory are normal.    Lab Results: Basic Metabolic Panel:  Recent Labs Lab 03/15/14 0316 03/16/14 0510  03/17/14 1250 03/18/14 0530  NA 141 138  < > 136* 134*  K 3.8 4.1  < > 4.3 4.3  CL 106 105  < > 105 103  CO2 20 20  < > 18* 19  GLUCOSE 92 94  < > 87 87  BUN 18 15  < > 11 10  CREATININE 1.01 0.96  < > 0.85 0.90  CALCIUM 9.6 9.9  < > 9.5 9.8  PHOS 2.6 2.2*  --   --   --   < > = values in this interval not displayed. Liver Function Tests:  Recent Labs Lab 03/16/14 0510 03/17/14 0812  AST 29 20  ALT 6 6  ALKPHOS 116 114  BILITOT 0.7 0.7  PROT 6.4 6.0  ALBUMIN 3.1*  3.2* 2.9*    Recent Labs Lab 03/12/14 1539  LIPASE 19   CBC:  Recent Labs Lab 03/12/14 1539 03/13/14 0651 03/18/14 1240  WBC 8.2 7.4 5.7  NEUTROABS 7.1  --  3.3  HGB 11.7* 11.7* 11.9*  HCT 36.1 36.5 36.3  MCV 88.7 91.3 89.2  PLT 227 226 212   Cardiac Enzymes:  Recent Labs Lab 03/12/14 1539 03/12/14 2355 03/13/14 0651  TROPONINI <0.30 <0.30 <0.30   Urinalysis:  Recent Labs Lab 03/12/14 2120  COLORURINE  YELLOW  LABSPEC 1.015  PHURINE 7.0  GLUCOSEU NEGATIVE  HGBUR TRACE*  BILIRUBINUR NEGATIVE  KETONESUR 15*  PROTEINUR 30*  UROBILINOGEN 0.2  NITRITE NEGATIVE  LEUKOCYTESUR NEGATIVE   Medications: I have reviewed the [name]'s current medications.  Prescriptions prior to admission  Medication Sig Dispense Refill  . acetaminophen (TYLENOL) 500 MG tablet Take 500 mg by mouth every 6 (six) hours as needed for mild pain.      Marland Kitchen. allopurinol (ZYLOPRIM) 100 MG tablet Take 100 mg by mouth at bedtime.      Marland Kitchen. amLODipine (NORVASC) 10 MG tablet Take 5 mg by mouth Daily.       Marland Kitchen. aspirin EC 81 MG tablet Take 81 mg by mouth daily.      . cinacalcet (SENSIPAR) 90 MG tablet Take 90 mg by mouth daily.      . colchicine 0.6 MG tablet Take 0.6 mg by mouth daily as needed. For gout      .  docusate sodium (COLACE) 100 MG capsule Take 100 mg by mouth 2 (two) times daily as needed. For constipation      . famotidine (PEPCID) 20 MG tablet Take 20 mg by mouth 2 (two) times daily.      . furosemide (LASIX) 80 MG tablet Take 80 mg by mouth 2 (two) times daily.       Marland Kitchen. losartan (COZAAR) 100 MG tablet Take 100 mg by mouth daily.      . metoprolol (LOPRESSOR) 50 MG tablet Take 100 mg by mouth 2 (two) times daily.      . mycophenolate (CELLCEPT) 250 MG capsule Take 500 mg by mouth 2 (two) times daily.      . simvastatin (ZOCOR) 20 MG tablet Take 20 mg by mouth at bedtime.       . tacrolimus (PROGRAF) 0.5 MG capsule Take 0.5 mg by mouth daily. Take 1 capsule by mouth every morning along with two of the 1mg  capsules for a total dose of 2.5mg       . tacrolimus (PROGRAF) 1 MG capsule Take 2 mg by mouth 2 (two) times daily.         Scheduled Meds: . amLODipine  10 mg Oral Daily  . aspirin EC  81 mg Oral Daily  . heparin  5,000 Units Subcutaneous 3 times per day  . losartan  100 mg Oral Daily  . metoprolol  50 mg Oral BID  . mycophenolate  500 mg Oral BID  . simvastatin  20 mg Oral QHS  . sodium chloride  3 mL Intravenous Q12H  . tacrolimus  2 mg Oral QHS  . tacrolimus  2.5 mg Oral Daily   Continuous Infusions:   PRN Meds:.gi cocktail, meclizine, ondansetron (ZOFRAN) IV, promethazine Assessment/Plan:  Nausea, decreased appetite, early satiety: [name] has had continued nausea, decreased appetite and inability to tolerate a soft diet in the setting of a 6 month history of decreased appetite, early satiety, and immunosuppression. Her CT abdomen/pelvis and RUQ US were significant for cholelithiasis. HIDA scan showed normal uptake of the tracer by the liver, and gallbladder and CBD were visualized at 25 min. Her post CCK  gallbladder ejection fraction was 95.6%. At 30 min, normal ejection fraction greater than 30%. She did not experience any symptoms during CCK infusion. [name] still has  mild tenderness in epigastric region. [name] has been afebrile and without leukocytosis during her admission. GI pathogen panel is negative. Stool culture panel.  [name] still meets inpatient status due to her persistent  nausea, vomiting, decreased appetite, and failure to thrive. We are concerned about a more serious process causing her symptoms. Originally, we thought the [name] had a viral gastroenteritis, but she did not improve as expected. She later disclosed to [place] that she had actually had a 6 month history of satiety, nausea and weight loss. We have consulted GI for further work up for possible EGD for evaluation of her early satiety and nausea. [name] has been drinking some amount of fluids, enough to only warrant IV fluids occasionally. We do not want to give her unnecessary IV fluids in order to avoid exacerbating her CHF. She continues to have nausea and abdominal pain, but she does not often ask for medication. She did receive zofran for nausea today. Pain medications reportedly make the [name] more nauseous. We would like to keep the [name] as inpatient status until GI can evaluate her in order to determine if she has a PUD, gastritis or possibly a more serious process going on. [name] had an episode of nausea and vomiting after eating lunch this afternoon.  -Soft diet, will change based on GI recommendations -Stool culture pending -Phenergan prn, Gi cocktail prn, Zofran prn, Meclizine prn -GI has been consulted, Dr. Elnoria Howard will see today -Appreciate GI recommendations  Diastolic CHF, CAD: Troponins negative x 3. EKG with new slight ST depression in I, II, III, avf, and slight ST elevation in V1, V2, V3. Does not appear to be in acute exacerbation as she appears hypovolemic-euvolemic on exam. Repeat EKG is similar to prior. No complaints of chest pain or shortness of breath. -losartan 100mg    -metoprolol 50mg  BID -simvastatin 20mg  -ASA 81  -furosemide 80mg  BID HELD   HTN: Continued  her home medications, but holding furosemide due to hypovolemia/euvolemia. SBP has normally been in the 130s-150s. She had two hypertensive episodes overnight: 180/73 and 162/71. We gave her her blood pressure medications this morning and last recheck was 132/51. -amlodipine 10mg    -losartan 100mg   -metoprolol 50mg  BID -furosemide 80mg  BID HELD  Focal sclerosing glomerulonephritis s/p kidney transplant in 2003: [name] is on Cellcept and Tacrolimus. She is being followed by Nephrology. Creatinine has been stable, 0.80-1.1 during admission. -Continue Cellcept and Tacrolimus po.  FEN: Soft Diet  DVT ppx: subq hep 5000u TID  Dispo: Disposition is deferred at this time, awaiting improvement of current medical problems.  Anticipated discharge in approximately 1-2 day(s).   The [name] does not have a current PCP (No primary provider on file.) and does not need an [facility] hospital follow-up appointment after discharge.  The [name] does not have transportation limitations that hinder transportation to clinic appointments.  .Services Needed at time of discharge: Y = Yes, Blank = No PT:   OT:   RN:   Equipment:   Other:     LOS: 7 days   Jill Alexanders, DO PGY-1 Internal Medicine Resident Pager # [phone] 03/19/2014 9:22 AM

## 2014-03-19 NOTE — Consult Note (Signed)
Unassigned consult  Reason for Consult: Persistent nausea and abdominal pain Referring Physician: Teaching Service  Unknown FoleyMae W Strohm HPI: This is an 80 year old female s/p renal transplant for FSGN, CAD, cholelithisis, HTN, and diastolic CHF who is admitted for an acute onset of nausea, vomiting, diarrhea, and abdominal pain.  Over the interval time period her symptoms have improved from the standpoint of her vomiting and diarrhea, but she continues to have intermittent nausea and abdominal pain.  CT scan, US, and HIDA were all negative for any overt pathology.  In the past she was diagnosed with E. Coli gastroenteritis, but she feels her current symptoms are worse.  The only new medication that she was started on was a pain medication for her wrist.  Her pain is mostly localized to the epigastric region and there is a regurgitation component.  Past Medical History  Diagnosis Date  . S/P kidney transplant 2003    due to FSGN, follows with Dr. Briant CedarMattingly  . Coronary artery disease   . Hypertension   . Cholelithiasis   . Diastolic CHF   . Gout     unconfirmed by joint aspiration  . Chronic kidney disease     Past Surgical History  Procedure Laterality Date  . Kidney transplant    . Abdominal hysterectomy      History reviewed. No pertinent family history.  Social History:  reports that she quit smoking about 44 years ago. She has never used smokeless tobacco. She reports that she does not drink alcohol or use illicit drugs.  Allergies:  Allergies  Allergen Reactions  . Sulfa Drugs Cross Reactors Swelling    Medications:  Scheduled: . amLODipine  10 mg Oral Daily  . aspirin EC  81 mg Oral Daily  . heparin  5,000 Units Subcutaneous 3 times per day  . losartan  100 mg Oral Daily  . metoprolol  50 mg Oral BID  . mycophenolate  500 mg Oral BID  . simvastatin  20 mg Oral QHS  . sodium chloride  3 mL Intravenous Q12H  . tacrolimus  2 mg Oral QHS  . tacrolimus  2.5 mg Oral Daily    Continuous:   No results found for this or any previous visit (from the past 24 hour(s)).   Nm Hepato W/eject Fract  03/18/2014   CLINICAL DATA:  Cholelithiasis, abdominal pain  EXAM: NUCLEAR MEDICINE HEPATOBILIARY IMAGING WITH GALLBLADDER EF  TECHNIQUE: Sequential images of the abdomen were obtained out to 60 minutes following intravenous administration of radiopharmaceutical. After slow intravenous infusion of 1.5 micrograms Cholecystokinin, gallbladder ejection fraction was determined.  RADIOPHARMACEUTICALS:  5.0 Millicurie Tc-5133m Choletec  COMPARISON:  CT scan 03/12/2014  FINDINGS: There is normal uptake of the tracer by the liver. Gallbladder and CBD visualized at 25 min. Bowel activity noted at 30 min. Post CCK gallbladder ejection fraction is 95.6%. At 30 min, normal ejection fraction is greater than 30%.  The patient did not experience symptoms during CCK infusion.  IMPRESSION: No cystic duct obstruction. Post CCK gallbladder ejection fraction is 95.6%.   Electronically Signed   By: [name]  Pop [name]   On: 03/18/2014 16:50    ROS:  As stated above in the HPI otherwise negative.  Blood pressure 132/51, pulse 70, temperature 98.3 F (36.8 C), temperature source Oral, resp. rate 18, height 5\' 2"  (1.575 m), weight 166 lb 7.2 oz (75.5 kg), SpO2 100.00%.    PE: Gen: NAD, Alert and Oriented HEENT:  [place]/AT, EOMI Neck: Supple, no  LAD Lungs: CTA Bilaterally CV: RRR without M/G/R ABM: Soft, epigastric tenderness, +BS Ext: No C/C/E  Assessment/Plan: 1) Epigastric pain. 2) Nausea   With her symptoms it is reasonable to perform an EGD for further evaluation.  I am uncertain about the etiology of her current symptoms.  Plan: 1) EGD tomorrow.  Chenoah Mcnally D 03/19/2014, 4:05 PM

## 2014-03-19 NOTE — Progress Notes (Signed)
Patient evaluated for community based chronic disease management services with [facility] as a benefit of patient's [employer]. Spoke with patient at bedside to explain [facility].  Patient has declined services at this time citing that she can afford medications still drives and manages her home.  Left contact information and [facility] literature at bedside. . Of note, [facility] services does not replace or interfere with any services that are arranged by inpatient case management or social work.  For additional questions or referrals please contact Anibal Hendersonim Henderson BSN RN [facility] Liaison at [phone].

## 2014-03-20 ENCOUNTER — Encounter (HOSPITAL_COMMUNITY): Payer: Medicare Other | Admitting: Anesthesiology

## 2014-03-20 ENCOUNTER — Encounter (HOSPITAL_COMMUNITY): Payer: Self-pay | Admitting: *Deleted

## 2014-03-20 ENCOUNTER — Inpatient Hospital Stay (HOSPITAL_COMMUNITY): Payer: Medicare Other | Admitting: Anesthesiology

## 2014-03-20 ENCOUNTER — Encounter (HOSPITAL_COMMUNITY)
Admission: EM | Disposition: A | Discharge status: Home / Self Care | Payer: Self-pay | Source: Home / Self Care | Attending: Internal Medicine

## 2014-03-20 DIAGNOSIS — Z8719 Personal history of other diseases of the digestive system: Secondary | ICD-10-CM | POA: Diagnosis present

## 2014-03-20 HISTORY — PX: ESOPHAGOGASTRODUODENOSCOPY: SHX5428

## 2014-03-20 LAB — CBC
HEMATOCRIT: 34 % — AB (ref 36.0–46.0)
Hemoglobin: 11.3 g/dL — ABNORMAL LOW (ref 12.0–15.0)
MCH: 29 pg (ref 26.0–34.0)
MCHC: 33.2 g/dL (ref 30.0–36.0)
MCV: 87.4 fL (ref 78.0–100.0)
Platelets: 206 10*3/uL (ref 150–400)
RBC: 3.89 MIL/uL (ref 3.87–5.11)
RDW: 13.3 % (ref 11.5–15.5)
WBC: 6.6 10*3/uL (ref 4.0–10.5)

## 2014-03-20 LAB — RENAL FUNCTION PANEL
Albumin: 2.8 g/dL — ABNORMAL LOW (ref 3.5–5.2)
Anion gap: 12 (ref 5–15)
BUN: 10 mg/dL (ref 6–23)
CALCIUM: 9.6 mg/dL (ref 8.4–10.5)
CO2: 20 meq/L (ref 19–32)
CREATININE: 0.95 mg/dL (ref 0.50–1.10)
Chloride: 104 mEq/L (ref 96–112)
GFR calc Af Amer: 64 mL/min — ABNORMAL LOW (ref >90)
GFR, EST NON AFRICAN AMERICAN: 55 mL/min — AB (ref >90)
Glucose, Bld: 102 mg/dL — ABNORMAL HIGH (ref 70–99)
Phosphorus: 2.2 mg/dL — ABNORMAL LOW (ref 2.3–4.6)
Potassium: 4.8 mEq/L (ref 3.7–5.3)
Sodium: 136 mEq/L — ABNORMAL LOW (ref 137–147)

## 2014-03-20 LAB — TSH: TSH: 1.51 u[IU]/mL (ref 0.350–4.500)

## 2014-03-20 LAB — CMV (CYTOMEGALOVIRUS) DNA ULTRAQUANT, PCR

## 2014-03-20 SURGERY — EGD (ESOPHAGOGASTRODUODENOSCOPY)
Anesthesia: Monitor Anesthesia Care

## 2014-03-20 MED ORDER — HYDROMORPHONE HCL PF 1 MG/ML IJ SOLN: 0.2500 mg | INTRAMUSCULAR | Status: DC | PRN

## 2014-03-20 MED ORDER — FLUCONAZOLE 200 MG PO TABS
200.0000 mg | ORAL_TABLET | Freq: Every day | ORAL | Status: DC
  Administered 2014-03-20 – 2014-03-21 (×2): 200 mg via ORAL
  Filled 2014-03-20 (×2): qty 1

## 2014-03-20 MED ORDER — BUTAMBEN-TETRACAINE-BENZOCAINE 2-2-14 % EX AERO
INHALATION_SPRAY | CUTANEOUS | Status: DC | PRN
  Administered 2014-03-20: 2 via TOPICAL

## 2014-03-20 MED ORDER — OXYCODONE HCL 5 MG PO TABS: 5.0000 mg | ORAL_TABLET | Freq: Once | ORAL | Status: AC | PRN

## 2014-03-20 MED ORDER — PANTOPRAZOLE SODIUM 20 MG PO TBEC
20.0000 mg | DELAYED_RELEASE_TABLET | Freq: Every day | ORAL | Status: DC
  Administered 2014-03-20 – 2014-03-21 (×2): 20 mg via ORAL
  Filled 2014-03-20 (×2): qty 1

## 2014-03-20 MED ORDER — FLUCONAZOLE 200 MG PO TABS: 200.0000 mg | ORAL_TABLET | Freq: Every day | ORAL | Status: DC

## 2014-03-20 MED ORDER — SENNOSIDES-DOCUSATE SODIUM 8.6-50 MG PO TABS: 1.0000 | ORAL_TABLET | Freq: Every day | ORAL | Status: DC | PRN

## 2014-03-20 MED ORDER — SODIUM CHLORIDE 0.9 % IV SOLN
INTRAVENOUS | Status: DC
  Administered 2014-03-20: 500 mL via INTRAVENOUS

## 2014-03-20 MED ORDER — SODIUM CHLORIDE 0.9 % IV SOLN
INTRAVENOUS | Status: DC | PRN
  Administered 2014-03-20: 13:00:00 via INTRAVENOUS

## 2014-03-20 MED ORDER — SENNOSIDES-DOCUSATE SODIUM 8.6-50 MG PO TABS
1.0000 | ORAL_TABLET | Freq: Every day | ORAL | Status: DC
  Administered 2014-03-21: 1 via ORAL
  Filled 2014-03-20: qty 1

## 2014-03-20 MED ORDER — OXYCODONE HCL 5 MG/5ML PO SOLN: 5.0000 mg | Freq: Once | ORAL | Status: AC | PRN

## 2014-03-20 MED ORDER — PROPOFOL 10 MG/ML IV BOLUS
INTRAVENOUS | Status: DC | PRN
  Administered 2014-03-20 (×4): 20 mg via INTRAVENOUS

## 2014-03-20 MED ORDER — ONDANSETRON HCL 4 MG/2ML IJ SOLN
INTRAMUSCULAR | Status: DC | PRN
  Administered 2014-03-20: 4 mg via INTRAVENOUS

## 2014-03-20 MED ORDER — FENTANYL CITRATE 0.05 MG/ML IJ SOLN
INTRAMUSCULAR | Status: DC | PRN
  Administered 2014-03-20: 50 ug via INTRAVENOUS

## 2014-03-20 MED ORDER — PANTOPRAZOLE SODIUM 20 MG PO TBEC: 20.0000 mg | DELAYED_RELEASE_TABLET | Freq: Every day | ORAL | Status: DC

## 2014-03-20 NOTE — Progress Notes (Signed)
     I have seen and examined the patient, and reviewed the daily progress note by Staci RighterAndrew Miller, MS III and discussed the care of the patient with them. Please see my progress note from 03/20/2014 for further details regarding assessment and plan.    Signed:  Jill AlexandersAlexa Richardson, MD 03/20/2014, 9:23 AM

## 2014-03-20 NOTE — Progress Notes (Signed)
Note/chart reviewed.  Katie Marien Manship, RD, LDN Pager #: [phone] After-Hours Pager #: [phone]  

## 2014-03-20 NOTE — Progress Notes (Addendum)
  I have seen and examined the patient, and reviewed the daily progress note by Staci RighterAndrew Miller, MS III and discussed the care of the patient with them. Please see my progress note from 03/20/2014 for further details regarding assessment and plan.    Signed:  Jill AlexandersAlexa Richardson, DO PGY-1 Internal Medicine Resident Pager # [phone] 03/20/2014 9:22 AM

## 2014-03-20 NOTE — Discharge Instructions (Addendum)
Thank you for allowing [place] to be involved in your healthcare while you were hospitalized at [facility].   Please note that there have been changes to your home medications.  --> PLEASE LOOK AT YOUR DISCHARGE MEDICATION LIST FOR DETAILS.  Please call your PCP if you have any questions or concerns, or any difficulty getting any of your medications.  Please return to the ER if you have worsening of your symptoms or new severe symptoms arise.  Take Prograf 1.5 mg twice a day until you complete Fluconazole therapy and then for 2 days after. You will then resume your normal dose on 04/02/2014 of Tacrolimus 2.5 mg twice a day.    Esophagitis Esophagitis is inflammation of the esophagus. It can involve swelling, soreness, and pain in the esophagus. This condition can make it difficult and painful to swallow. CAUSES  Most causes of esophagitis are not serious. Many different factors can cause esophagitis, including:  Gastroesophageal reflux disease (GERD). This is when acid from your stomach flows up into the esophagus.  Recurrent vomiting.  An allergic-type reaction.  Certain medicines, especially those that come in large pills.  Ingestion of harmful chemicals, such as household cleaning products.  Heavy alcohol use.  An infection of the esophagus.  Radiation treatment for cancer.  Certain diseases such as sarcoidosis, Crohn's disease, and scleroderma. These diseases may cause recurrent esophagitis. SYMPTOMS   Trouble swallowing.  Painful swallowing.  Chest pain.  Difficulty breathing.  Nausea.  Vomiting.  Abdominal pain. DIAGNOSIS  Your caregiver will take your history and do a physical exam. Depending upon what your caregiver finds, certain tests may also be done, including:  Barium X-ray. You will drink a solution that coats the esophagus, and X-rays will be taken.  Endoscopy. A lighted tube is put down the esophagus so your caregiver can examine the  area.  Allergy tests. These can sometimes be arranged through follow-up visits. TREATMENT  Treatment will depend on the cause of your esophagitis. In some cases, steroids or other medicines may be given to help relieve your symptoms or to treat the underlying cause of your condition. Medicines that may be recommended include:  Viscous lidocaine, to soothe the esophagus.  Antacids.  Acid reducers.  Proton pump inhibitors.  Antiviral medicines for certain viral infections of the esophagus.  Antifungal medicines for certain fungal infections of the esophagus.  Antibiotic medicines, depending on the cause of the esophagitis. HOME CARE INSTRUCTIONS   Avoid foods and drinks that seem to make your symptoms worse.  Eat small, frequent meals instead of large meals.  Avoid eating for the 3 hours prior to your bedtime.  If you have trouble taking pills, use a pill splitter to decrease the size and likelihood of the pill getting stuck or injuring the esophagus on the way down. Drinking water after taking a pill also helps.  Stop smoking if you smoke.  Maintain a healthy weight.  Wear loose-fitting clothing. Do not wear anything tight around your waist that causes pressure on your stomach.  Raise the head of your bed 6 to 8 inches with wood blocks to help you sleep. Extra pillows will not help.  Only take over-the-counter or prescription medicines as directed by your caregiver. SEEK IMMEDIATE MEDICAL CARE IF:  You have severe chest pain that radiates into your arm, neck, or jaw.  You feel sweaty, dizzy, or lightheaded.  You have shortness of breath.  You vomit blood.  You have difficulty or pain with  swallowing.  You have bloody or black, tarry stools.  You have a fever.  You have a burning sensation in the chest more than 3 times a week for more than 2 weeks.  You cannot swallow, drink, or eat.  You drool because you cannot swallow your saliva. MAKE SURE  YOU:  Understand these instructions.  Will watch your condition.  Will get help right away if you are not doing well or get worse. Document Released: 08/26/2004 Document Revised: 10/11/2011 Document Reviewed: 03/19/2011 [facility] Patient Information 2015 [employer] [place]. This information is not intended to replace advice given to you by your health care provider. Make sure you discuss any questions you have with your health care provider.

## 2014-03-20 NOTE — Progress Notes (Signed)
INITIAL NUTRITION ASSESSMENT  DOCUMENTATION CODES Per approved criteria  -Obesity Unspecified   INTERVENTION: Provide Magic cup TID, each supplement provides 290 kcal and 9 grams of protein. Ordered.  Recommend appetite stimulant medication.   NUTRITION DIAGNOSIS: Inadequate oral intake related to decreased appetite as evidenced by meal completion of 20-50%.   Goal: Pt to meet >/= 90% of their estimated nutrition needs   Monitor:  Diet advancement, weight trends, labs, I/O's  Reason for Assessment: MST  80 y.o. female  Admitting Dx: Vomiting and diarrhea  ASSESSMENT:  PT with PMH of ESRD status post renal transplant in 2003, diastolic congestive heart failure, coronary artery disease, hypertension, and other problems as outlined in medical history, admitted with complaint of abdominal pain with nausea, vomiting, and diarrhea.  Pt reports she is having a decreased appetite, but it has been improving over the past couple of days during hospitalization. Meal completion is 20-50%. Pt reports she has had a decreased appetite for over the past 6 months. Pt reports she would eat one meal a day and then drink calorie contains drinks (lemonade, tea, soda) for the remaining of the day. Pt reports her usual body weight of 163 lbs and reports no recent weight loss. Pt refused other oral supplements, but is willing to try magic cup. Will order when diet is advanced. Pt was educated to try oral supplements at home, such as Ensure or Boost to help with calorie and protein intake. Pt expressed understanding.  Nutrition Focused Physical Exam:  Subcutaneous Fat:  Orbital Region: N/A Upper Arm Region: WNL Thoracic and Lumbar Region: WNL  Muscle:  Temple Region: Moderate depletion Clavicle Bone Region: WNL Clavicle and Acromion Bone Region: WNL Scapular Bone Region: WNL Dorsal Hand: WNL Patellar Region: WNL Anterior Thigh Region: WNL Posterior Calf Region: WNL  Edema: non-pitting  LE  Labs: Low sodium, phosphorous, and GFR. High glucose (102 mg/dL).  Height: Ht Readings from Last 1 Encounters:  03/13/14 5\' 2"  (1.575 m)    Weight: Wt Readings from Last 1 Encounters:  03/19/14 166 lb 7.2 oz (75.501 kg)   Ideal Body Weight: 110 lbs  % Ideal Body Weight: 151%  Wt Readings from Last 10 Encounters:  03/19/14 166 lb 7.2 oz (75.501 kg)  03/19/14 166 lb 7.2 oz (75.501 kg)  07/11/13 161 lb 4.8 oz (73.165 kg)  07/05/12 165 lb 9.6 oz (75.116 kg)  06/29/12 160 lb (72.576 kg)  05/03/12 160 lb 8 oz (72.802 kg)  04/26/12 163 lb (73.936 kg)    Usual Body Weight: 163 lbs  % Usual Body Weight: 102%  BMI:  Body mass index is 30.44 kg/(m^2). Class I obesity  Adjusted body weight: 72.6 kg  Estimated Nutritional Needs: Kcal: 1700-1900 Protein: 80-90 grams Fluid: Per MD  Skin: non-pitting LE edema  Diet Order: General  EDUCATION NEEDS: -Education needs addressed   Intake/Output Summary (Last 24 hours) at 03/20/14 1349 Last data filed at 03/20/14 1334  Gross per 24 hour  Intake    740 ml  Output      0 ml  Net    740 ml    Last BM: 8/16   Labs:   Recent Labs Lab 03/15/14 0316 03/16/14 0510  03/17/14 1250 03/18/14 0530 03/20/14 0500  NA 141 138  < > 136* 134* 136*  K 3.8 4.1  < > 4.3 4.3 4.8  CL 106 105  < > 105 103 104  CO2 20 20  < > 18* 19 20  BUN 18  15  < > 11 10 10   CREATININE 1.01 0.96  < > 0.85 0.90 0.95  CALCIUM 9.6 9.9  < > 9.5 9.8 9.6  PHOS 2.6 2.2*  --   --   --  2.2*  GLUCOSE 92 94  < > 87 87 102*  < > = values in this interval not displayed.  CBG (last 3)  No results found for this basename: GLUCAP,  in the last 72 hours  Scheduled Meds: . amLODipine  10 mg Oral Daily  . aspirin EC  81 mg Oral Daily  . fluconazole  200 mg Oral Daily  . heparin  5,000 Units Subcutaneous 3 times per day  . losartan  100 mg Oral Daily  . metoprolol  50 mg Oral BID  . mycophenolate  500 mg Oral BID  . simvastatin  20 mg Oral QHS  .  sodium chloride  3 mL Intravenous Q12H  . tacrolimus  2 mg Oral QHS  . tacrolimus  2.5 mg Oral Daily    Continuous Infusions:   Past Medical History  Diagnosis Date  . S/P kidney transplant 2003    due to FSGN, follows with Dr. Briant CedarMattingly  . Coronary artery disease   . Hypertension   . Cholelithiasis   . Diastolic CHF   . Gout     unconfirmed by joint aspiration  . Chronic kidney disease     Past Surgical History  Procedure Laterality Date  . Kidney transplant    . Abdominal hysterectomy      Marijean NiemannStephanie La, MS, Provisional LDN Pager # [phone] After hours/ weekend pager # [phone]

## 2014-03-20 NOTE — Progress Notes (Addendum)
Subjective:  Patient was seen and examined this morning. Patient states she feels "like a new person." However, she did have an episode of vomiting yesterday after lunch and continues to have fatigue, weakness and decreased appetite. Patient would like for Dr. Benson Norway to discuss risks/benefits of EGD with her before she signs the consent form. She denies any fever, chills, vomiting today, diarrhea, or abdominal pain.   Objective: Vital signs in last 24 hours: Filed Vitals:   03/19/14 0914 03/19/14 1646 03/19/14 2102 03/20/14 0620  BP: 132/51 122/51 139/75 140/73  Pulse: 70 54 60 59  Temp: 98.3 F (36.8 C) 98 F (36.7 C) 97.9 F (36.6 C) 98.4 F (36.9 C)  TempSrc: Oral Oral Oral Oral  Resp: 18 18 18 18   Height:      Weight:   166 lb 7.2 oz (75.501 kg)   SpO2: 100% 100% 95% 97%    Intake/Output Summary (Last 24 hours) at 03/20/14 0737 Last data filed at 03/20/14 0932  Gross per 24 hour  Intake    680 ml  Output      0 ml  Net    680 ml   General: Vital signs reviewed.  Patient is well-developed and well-nourished, in no acute distress.  Cardiovascular: RRR, S1 normal, S2 normal, no murmurs, gallops, or rubs. Pulmonary/Chest: Clear to auscultation bilaterally, no wheezes, rales, or rhonchi. Abdominal: Soft, mildly tender to palpation in epigastric region, non-distended, decreased BS, no guarding present. Surgical scar present over RUQ/LUQ.  Musculoskeletal: No joint deformities, erythema, or stiffness, ROM full and nontender. Extremities:Trace edema in lower extremities bilaterally, no calf tenderness bilaterally,  pulses symmetric and intact bilaterally. No cyanosis or clubbing. Neurological: A&O x3, Strength is normal and symmetric bilaterally, no focal motor deficit, sensory intact to light touch bilaterally.  Skin: Warm, dry and intact. No rashes or erythema. Psychiatric: Normal mood and affect. speech and behavior is normal. Cognition and memory are normal.    Lab  Results: Basic Metabolic Panel:  Recent Labs Lab 03/16/14 0510  03/18/14 0530 03/20/14 0500  NA 138  < > 134* 136*  K 4.1  < > 4.3 4.8  CL 105  < > 103 104  CO2 20  < > 19 20  GLUCOSE 94  < > 87 102*  BUN 15  < > 10 10  CREATININE 0.96  < > 0.90 0.95  CALCIUM 9.9  < > 9.8 9.6  PHOS 2.2*  --   --  2.2*  < > = values in this interval not displayed. Liver Function Tests:  Recent Labs Lab 03/16/14 0510 03/17/14 0812 03/20/14 0500  AST 29 20  --   ALT 6 6  --   ALKPHOS 116 114  --   BILITOT 0.7 0.7  --   PROT 6.4 6.0  --   ALBUMIN 3.1*  3.2* 2.9* 2.8*   CBC:  Recent Labs Lab 03/18/14 1240 03/20/14 0500  WBC 5.7 6.6  NEUTROABS 3.3  --   HGB 11.9* 11.3*  HCT 36.3 34.0*  MCV 89.2 87.4  PLT 212 206   Medications: I have reviewed the patient's current medications.  Prescriptions prior to admission  Medication Sig Dispense Refill  . acetaminophen (TYLENOL) 500 MG tablet Take 500 mg by mouth every 6 (six) hours as needed for mild pain.      Marland Kitchen allopurinol (ZYLOPRIM) 100 MG tablet Take 100 mg by mouth at bedtime.      Marland Kitchen amLODipine (NORVASC) 10 MG tablet Take  5 mg by mouth Daily.       Marland Kitchen aspirin EC 81 MG tablet Take 81 mg by mouth daily.      . cinacalcet (SENSIPAR) 90 MG tablet Take 90 mg by mouth daily.      . colchicine 0.6 MG tablet Take 0.6 mg by mouth daily as needed. For gout      . docusate sodium (COLACE) 100 MG capsule Take 100 mg by mouth 2 (two) times daily as needed. For constipation      . famotidine (PEPCID) 20 MG tablet Take 20 mg by mouth 2 (two) times daily.      . furosemide (LASIX) 80 MG tablet Take 80 mg by mouth 2 (two) times daily.       Marland Kitchen losartan (COZAAR) 100 MG tablet Take 100 mg by mouth daily.      . metoprolol (LOPRESSOR) 50 MG tablet Take 100 mg by mouth 2 (two) times daily.      . mycophenolate (CELLCEPT) 250 MG capsule Take 500 mg by mouth 2 (two) times daily.      . simvastatin (ZOCOR) 20 MG tablet Take 20 mg by mouth at bedtime.        . tacrolimus (PROGRAF) 0.5 MG capsule Take 0.5 mg by mouth daily. Take 1 capsule by mouth every morning along with two of the 53m capsules for a total dose of 2.521m     . tacrolimus (PROGRAF) 1 MG capsule Take 2 mg by mouth 2 (two) times daily.         Scheduled Meds: . amLODipine  10 mg Oral Daily  . aspirin EC  81 mg Oral Daily  . heparin  5,000 Units Subcutaneous 3 times per day  . losartan  100 mg Oral Daily  . metoprolol  50 mg Oral BID  . mycophenolate  500 mg Oral BID  . simvastatin  20 mg Oral QHS  . sodium chloride  3 mL Intravenous Q12H  . tacrolimus  2 mg Oral QHS  . tacrolimus  2.5 mg Oral Daily   Continuous Infusions:   PRN Meds:.gi cocktail, meclizine, ondansetron (ZOFRAN) IV, promethazine Assessment/Plan:  Epigastric pain, nausea, and early satiety: Patient presented with onset of nausea, vomiting, and diarrhea. She reports a 6 month history of early satiety. Her vomiting and diarrhea have improved over the course of her stay, but she continues to have epigastric pain, nausea, and early satiety without an appetite. Lipase was normal, CT abdomen/pelvis and RUQ US[place]ore[place]owed cholelithiasis. HIDA scan was normal. C Diff negative. Normal urinalysis. Normal AST, ALT and Alk phos. GI panel negative, lactoferrin negative. Stool culture pending. Patient has remained afebrile without leukocytosis during her stay. Patient has tenderness in epigastric region on exam. She continues to have nausea for which she has anti-nausea medications on boards, continues to have epigastric pain, but does not require pain mediations (makes her nauseous). We will hold off on IV fluids for now because patient is tolerating some po liquids. We do not want to exacerbate her CHF. -Currently NPO for endoscopy, will advance afterwards pending results -EGD today by Dr. HuBenson Norwayt approximately 12:30 pm -Stool culture pending -Phenergan prn, Gi cocktail prn, Zofran prn, Meclizine prn  Diastolic CHF, CAD:  Troponins negative x 3. EKG with new slight ST depression in I, II, III, avf, and slight ST elevation in V1, V2, V3. Does not appear to be in acute exacerbation as she appears hypovolemic-euvolemic on exam. Repeat EKG is similar to prior. No  complaints of chest pain or shortness of breath. -losartan 131m   -metoprolol 541mBID -simvastatin 2027mASA 81  -furosemide 9m19mD HELD  -Hold off on IV fluids unless necessary  HTN: Continued her home medications, but holding furosemide due to hypovolemia/euvolemia. BP has been 122/51 to 140/73 overnight. -amlodipine 10mg75mlosartan 100mg 10mtoprolol 50mg B64mfurosemide 9mg BI34mLD  Focal sclerosing glomerulonephritis s/p kidney transplant in 2003: Patient is on Cellcept and Tacrolimus. She was being followed by Nephrology. Creatinine has been stable, 0.80-1.1 during admission. -Continue Cellcept and Tacrolimus po -Nephrology has signed off -Follow up outpatient with Dr. MattinglMercy Moorelogy  FEN: NPO, will advance after endoscopy pending results  DVT ppx: subq hep 5000u TID  Dispo: Disposition is deferred at this time, awaiting improvement of current medical problems.  Anticipated discharge in approximately 1-2 day(s).   The patient does not have a current PCP (No primary provider on file.) and [facility].  The patient does not have transportation limitations that hinder transportation to clinic appointments.  .Services Needed at time of discharge: Y = Yes, Blank = No PT:   OT:   RN:   Equipment:   Other:     LOS: 8 days   Kemesha Mosey RiOsa Craver-1 Internal Medicine Resident Pager # [phone] 7:37 AM

## 2014-03-20 NOTE — Progress Notes (Addendum)
S:She feels better today.  Says she started to improve yesterday after lunch.  No vomiting or diarrhea.  Had small BM this morning.  Reports normal UOP.   She is NPO for EGD today. Wants to discuss risks/benefits with Dr. Elnoria Howard before signing consent.  O:BP 140/73  Pulse 59  Temp(Src) 98.4 F (36.9 C) (Oral)  Resp 18  Ht 5\' 2"  (1.575 m)  Wt 75.501 kg (166 lb 7.2 oz)  BMI 30.44 kg/m2  SpO2 97%  Intake/Output Summary (Last 24 hours) at 03/20/14 0820 Last data filed at 03/20/14 1610  Gross per 24 hour  Intake    680 ml  Output      0 ml  Net    680 ml   Intake/Output: I/O last 3 completed shifts: In: 680 [P.O.:680] Out: -   Intake/Output this shift:    Weight change: 0.001 kg (0 oz) RUE:AVWUJWJ up in bed reading, in NAD CVS:RRR, no m/r/g Resp:CTA B/L, no w/r/r Abd:+BS, soft, ND, diffusely TTP (R>L) Ext:no edema, non-tender, warm and well perfused   Recent Labs Lab 03/14/14 0730 03/15/14 0316 03/16/14 0510 03/17/14 0812 03/17/14 1250 03/18/14 0530 03/20/14 0500  NA 141 141 138 139 136* 134* 136*  K 4.0 3.8 4.1 4.4 4.3 4.3 4.8  CL 106 106 105 108 105 103 104  CO2 20 20 20  17* 18* 19 20  GLUCOSE 93 92 94 85 87 87 102*  BUN 21 18 15 11 11 10 10   CREATININE 1.09 1.01 0.96 0.86 0.85 0.90 0.95  ALBUMIN  --  3.3* 3.1*  3.2* 2.9*  --   --  2.8*  CALCIUM 10.0 9.6 9.9 9.7 9.5 9.8 9.6  PHOS  --  2.6 2.2*  --   --   --  2.2*  AST  --   --  29 20  --   --   --   ALT  --   --  6 6  --   --   --    Liver Function Tests:  Recent Labs Lab 03/16/14 0510 03/17/14 0812 03/20/14 0500  AST 29 20  --   ALT 6 6  --   ALKPHOS 116 114  --   BILITOT 0.7 0.7  --   PROT 6.4 6.0  --   ALBUMIN 3.1*  3.2* 2.9* 2.8*   No results found for this basename: LIPASE, AMYLASE,  in the last 168 hours No results found for this basename: AMMONIA,  in the last 168 hours CBC:  Recent Labs Lab 03/18/14 1240 03/20/14 0500  WBC 5.7 6.6  NEUTROABS 3.3  --   HGB 11.9* 11.3*  HCT 36.3 34.0*   MCV 89.2 87.4  PLT 212 206   Cardiac Enzymes: No results found for this basename: CKTOTAL, CKMB, CKMBINDEX, TROPONINI,  in the last 168 hours CBG: No results found for this basename: GLUCAP,  in the last 168 hours  Iron Studies: No results found for this basename: IRON, TIBC, TRANSFERRIN, FERRITIN,  in the last 72 hours Studies/Results: Nm Hepato W/eject Fract  03/18/2014   CLINICAL DATA:  Cholelithiasis, abdominal pain  EXAM: NUCLEAR MEDICINE HEPATOBILIARY IMAGING WITH GALLBLADDER EF  TECHNIQUE: Sequential images of the abdomen were obtained out to 60 minutes following intravenous administration of radiopharmaceutical. After slow intravenous infusion of 1.5 micrograms Cholecystokinin, gallbladder ejection fraction was determined.  RADIOPHARMACEUTICALS:  5.0 Millicurie Tc-70m Choletec  COMPARISON:  CT scan 03/12/2014  FINDINGS: There is normal uptake of the tracer by the liver. Gallbladder and  CBD visualized at 25 min. Bowel activity noted at 30 min. Post CCK gallbladder ejection fraction is 95.6%. At 30 min, normal ejection fraction is greater than 30%.  The patient did not experience symptoms during CCK infusion.  IMPRESSION: No cystic duct obstruction. Post CCK gallbladder ejection fraction is 95.6%.   Electronically Signed   By: [name]  Pop [name]   On: 03/18/2014 16:50   . amLODipine  10 mg Oral Daily  . aspirin EC  81 mg Oral Daily  . heparin  5,000 Units Subcutaneous 3 times per day  . losartan  100 mg Oral Daily  . metoprolol  50 mg Oral BID  . mycophenolate  500 mg Oral BID  . simvastatin  20 mg Oral QHS  . sodium chloride  3 mL Intravenous Q12H  . tacrolimus  2 mg Oral QHS  . tacrolimus  2.5 mg Oral Daily    BMET    Component Value Date/Time   NA 136* 03/20/2014 0500   K 4.8 03/20/2014 0500   CL 104 03/20/2014 0500   CO2 20 03/20/2014 0500   GLUCOSE 102* 03/20/2014 0500   BUN 10 03/20/2014 0500   CREATININE 0.95 03/20/2014 0500   CALCIUM 9.6 03/20/2014 0500   GFRNONAA 55*  03/20/2014 0500   GFRAA 64* 03/20/2014 0500   CBC    Component Value Date/Time   WBC 6.6 03/20/2014 0500   RBC 3.89 03/20/2014 0500   HGB 11.3* 03/20/2014 0500   HCT 34.0* 03/20/2014 0500   PLT 206 03/20/2014 0500   MCV 87.4 03/20/2014 0500   MCH 29.0 03/20/2014 0500   MCHC 33.2 03/20/2014 0500   RDW 13.3 03/20/2014 0500   LYMPHSABS 1.5 03/18/2014 1240   MONOABS 0.6 03/18/2014 1240   EOSABS 0.3 03/18/2014 1240   BASOSABS 0.0 03/18/2014 1240     Assessment/Plan:  1. Renal transplant - Cr stable.  Continue Prograf and Cellcept. Continue to monitor renal function.  Will sign off for now.  Please call with questions or concerns. 2. N/V/D and dysphagia/early satiety x 6 months - GI consulted and plan for EGD today 3.  HTN and dHF - stable, plan per primary   Joy PeatAlex Wilson, DO IMTS, PGY2 [phone] I have seen and examined this patient and agree with plan as outlined by Dr. Andrey CampanileWilson.  Cont with immunosuppressive agents.  No further input, will sign off for now, please call with any issues regarding renal transplant.  Follow up with Dr. Briant CedarMattingly as an outpt once stable for discharge. Jahkeem Kurka A,MD 03/20/2014 9:46 AM

## 2014-03-20 NOTE — Interval H&P Note (Signed)
History and Physical Interval Note:  03/20/2014 12:30 PM  [name]  has presented today for surgery, with the diagnosis of Nausea and epigastric pain  The various methods of treatment have been discussed with the patient and family. After consideration of risks, benefits and other options for treatment, the patient has consented to  Procedure(s): ESOPHAGOGASTRODUODENOSCOPY (EGD) (N/A) as a surgical intervention .  The patient's history has been reviewed, patient examined, no change in status, stable for surgery.  I have reviewed the patient's chart and labs.  Questions were answered to the patient's satisfaction.     Haruka Kowaleski D

## 2014-03-20 NOTE — Progress Notes (Signed)
  PROGRESS NOTE MEDICINE TEACHING ATTENDING   Day 8 of stay Patient name: [name]   Medical record number: [id number] Date of birth: 21-Dec-1931    Met with the patient on morning rounds with the entire team. Patient feels much better today with no nausea or vomiting. She was able to eat some dinner yesterday. GI would do EGD today.   I have discussed the care of this patient with my IM team residents. Please see Dr Nena Alexander note for details or management plan.  [place], [place] 03/20/2014, 11:39 AM.

## 2014-03-20 NOTE — Progress Notes (Signed)
Prior to pt leaving for Endo; pt placed her 5 rings and 1 pain on earrings in container and cup placed in night stand. Explained to pt staff are not responsible for jewelry left in room; pt verbalized understanding.

## 2014-03-20 NOTE — Anesthesia Postprocedure Evaluation (Signed)
Anesthesia Post Note  Patient: [name]  Procedure(s) Performed: Procedure(s) (LRB): ESOPHAGOGASTRODUODENOSCOPY (EGD) (N/A)  Anesthesia type: MAC  Patient location: PACU  Post pain: Pain level controlled  Post assessment: Patient's Cardiovascular Status Stable  Last Vitals:  Filed Vitals:   03/20/14 1309  BP:   Pulse: 64  Temp:   Resp: 16    Post vital signs: Reviewed and stable  Level of consciousness: sedated  Complications: No apparent anesthesia complications

## 2014-03-20 NOTE — Anesthesia Preprocedure Evaluation (Signed)
Anesthesia Evaluation  Patient identified by MRN, date of birth, ID band Patient awake    Reviewed: Allergy & Precautions, H&P , NPO status , Patient's Chart, lab work & pertinent test results  History of Anesthesia Complications Negative for: history of anesthetic complications  Airway Mallampati: II TM Distance: >3 FB Neck ROM: Full    Dental  (+) Poor Dentition, Dental Advisory Given   Pulmonary former smoker,    Pulmonary exam normal       Cardiovascular hypertension, + CAD and +CHF     Neuro/Psych negative neurological ROS  negative psych ROS   GI/Hepatic negative GI ROS, Neg liver ROS,   Endo/Other  negative endocrine ROS  Renal/GU Renal diseaseS/P Renal Tx     Musculoskeletal   Abdominal   Peds  Hematology   Anesthesia Other Findings   Reproductive/Obstetrics                           Anesthesia Physical Anesthesia Plan  ASA: III  Anesthesia Plan: MAC   Post-op Pain Management:    Induction: Intravenous  Airway Management Planned: Simple Face Mask  Additional Equipment:   Intra-op Plan:   Post-operative Plan:   Informed Consent: I have reviewed the patients History and Physical, chart, labs and discussed the procedure including the risks, benefits and alternatives for the proposed anesthesia with the patient or authorized representative who has indicated his/her understanding and acceptance.   Dental advisory given  Plan Discussed with: CRNA, Anesthesiologist and Surgeon  Anesthesia Plan Comments:         Anesthesia Quick Evaluation

## 2014-03-20 NOTE — Progress Notes (Signed)
Subjective: [name] is an 80yo woman who was admitted 8 days ago for nausea, vomiting, and mid epigastric pain with a history of renal transplant, CHF, CAD, and HTN. This morning she was very happy stating she was feeling "so much better" and "I feel like my old self" as of 16:00 yesterday. She denies any pain, nausea, or vomiting. Yesterday evening she ate some cottage cheese with pineapple and states that she had no problems with that. She was seen by GI yesterday who has scheduled her for an EGD today although she would like to talk to GI more about the risks and benefits of the procedure first.  Objective: Vital signs in last 24 hours: Filed Vitals:   03/19/14 0914 03/19/14 1646 03/19/14 2102 03/20/14 0620  BP: 132/51 122/51 139/75 140/73  Pulse: 70 54 60 59  Temp: 98.3 F (36.8 C) 98 F (36.7 C) 97.9 F (36.6 C) 98.4 F (36.9 C)  TempSrc: Oral Oral Oral Oral  Resp: 18 18 18 18  Height:      Weight:   75.501 kg (166 lb 7.2 oz)   SpO2: 100% 100% 95% 97%   Weight change: 0.001 kg (0 oz)  Intake/Output Summary (Last 24 hours) at 03/20/14 0830 Last data filed at 03/20/14 0625  Gross per 24 hour  Intake    680 ml  Output      0 ml  Net    680 ml   Exam: General appearance: alert and cooperative Lungs: clear to auscultation bilaterally Heart: regular rate and rhythm, S1, S2 normal, no murmur, click, rub or gallop Abdomen: Patient is tender in the mid epigastric region with minimal guarding Lab Results: Basic Metabolic Panel:  Recent Labs Lab 03/16/14 0510  03/18/14 0530 03/20/14 0500  NA 138  < > 134* 136*  K 4.1  < > 4.3 4.8  CL 105  < > 103 104  CO2 20  < > 19 20  GLUCOSE 94  < > 87 102*  BUN 15  < > 10 10  CREATININE 0.96  < > 0.90 0.95  CALCIUM 9.9  < > 9.8 9.6  PHOS 2.2*  --   --  2.2*  < > = values in this interval not displayed. Liver Function Tests:  Recent Labs Lab 03/16/14 0510 03/17/14 0812 03/20/14 0500  AST 29 20  --   ALT 6 6  --   ALKPHOS  116 114  --   BILITOT 0.7 0.7  --   PROT 6.4 6.0  --   ALBUMIN 3.1*  3.2* 2.9* 2.8*   No results found for this basename: LIPASE, AMYLASE,  in the last 168 hours No results found for this basename: AMMONIA,  in the last 168 hours CBC:  Recent Labs Lab 03/18/14 1240 03/20/14 0500  WBC 5.7 6.6  NEUTROABS 3.3  --   HGB 11.9* 11.3*  HCT 36.3 34.0*  MCV 89.2 87.4  PLT 212 206   Studies/Results: Nm Hepato W/eject Fract  03/18/2014   CLINICAL DATA:  Cholelithiasis, abdominal pain  EXAM: NUCLEAR MEDICINE HEPATOBILIARY IMAGING WITH GALLBLADDER EF  TECHNIQUE: Sequential images of the abdomen were obtained out to 60 minutes following intravenous administration of radiopharmaceutical. After slow intravenous infusion of 1.5 micrograms Cholecystokinin, gallbladder ejection fraction was determined.  RADIOPHARMACEUTICALS:  5.0 Millicurie Tc-99m Choletec  COMPARISON:  CT scan 03/12/2014  FINDINGS: There is normal uptake of the tracer by the liver. Gallbladder and CBD visualized at 25 min. Bowel activity noted at 30   min. Post CCK gallbladder ejection fraction is 95.6%. At 30 min, normal ejection fraction is greater than 30%.  The patient did not experience symptoms during CCK infusion.  IMPRESSION: No cystic duct obstruction. Post CCK gallbladder ejection fraction is 95.6%.   Electronically Signed   By: Liviu  Pop [name]   On: 03/18/2014 16:50   Medications: I have reviewed the patient's current medications. Scheduled Meds: . amLODipine  10 mg Oral Daily  . aspirin EC  81 mg Oral Daily  . heparin  5,000 Units Subcutaneous 3 times per day  . losartan  100 mg Oral Daily  . metoprolol  50 mg Oral BID  . mycophenolate  500 mg Oral BID  . simvastatin  20 mg Oral QHS  . sodium chloride  3 mL Intravenous Q12H  . tacrolimus  2 mg Oral QHS  . tacrolimus  2.5 mg Oral Daily   Continuous Infusions:  PRN Meds:.gi cocktail, meclizine, ondansetron (ZOFRAN) IV, promethazine Assessment/Plan: Principal Problem:    Vomiting and diarrhea Active Problems:   S/P kidney transplant   Diastolic CHF   CAD (coronary artery disease)   Hypertension  Abd pain: While patient reports feeling "like my old self" she still has significant mid epigastric abd pain and warrants further workup. As of today she has had a RUQ U/S, CT w/ contrast, and HIDA scan all of which were negative except for cholelithiasis. Recent AST, ALT, Alk phos, and electrolyte panel were also negative except for a mild hyponatremia. She is scheduled for EGD this afternoon and further workup will be based on the results of that study. Her pain is currently well controlled on no medications.   CHF, CAD, HTN: No complaints of shortness of breath or substernal chest pain. Patient is currently being managed with: -Simvastatin 20mg -Losartan 100mg -Aspirin 81mg -Metoprolol 50mg  Renal Transplant for Focal Sclerosing Glomerulonephritis Patient is currently stable with a Creatinine of .95. She is compliant with her Tacrolimus .5mg and Mycophenolate 250mg.   This is a Medical Student Note.  The care of the patient was discussed with Dr. Richardson and the assessment and plan formulated with their assistance.  Please see their attached note for official documentation of the daily encounter.   LOS: 8 days   Andrew K Miller, Med Student 03/20/2014, 8:30 AM 

## 2014-03-20 NOTE — H&P (View-Only) (Signed)
Unassigned consult  Reason for Consult: Persistent nausea and abdominal pain Referring Physician: Teaching Service  [name] HPI: This is an 80 year old female s/p renal transplant for FSGN, CAD, cholelithisis, HTN, and diastolic CHF who is admitted for an acute onset of nausea, vomiting, diarrhea, and abdominal pain.  Over the interval time period her symptoms have improved from the standpoint of her vomiting and diarrhea, but she continues to have intermittent nausea and abdominal pain.  CT scan, US, and HIDA were all negative for any overt pathology.  In the past she was diagnosed with E. Coli gastroenteritis, but she feels her current symptoms are worse.  The only new medication that she was started on was a pain medication for her wrist.  Her pain is mostly localized to the epigastric region and there is a regurgitation component.  Past Medical History  Diagnosis Date  . S/P kidney transplant 2003    due to FSGN, follows with Dr. Briant CedarMattingly  . Coronary artery disease   . Hypertension   . Cholelithiasis   . Diastolic CHF   . Gout     unconfirmed by joint aspiration  . Chronic kidney disease     Past Surgical History  Procedure Laterality Date  . Kidney transplant    . Abdominal hysterectomy      History reviewed. No pertinent family history.  Social History:  reports that she quit smoking about 44 years ago. She has never used smokeless tobacco. She reports that she does not drink alcohol or use illicit drugs.  Allergies:  Allergies  Allergen Reactions  . Sulfa Drugs Cross Reactors Swelling    Medications:  Scheduled: . amLODipine  10 mg Oral Daily  . aspirin EC  81 mg Oral Daily  . heparin  5,000 Units Subcutaneous 3 times per day  . losartan  100 mg Oral Daily  . metoprolol  50 mg Oral BID  . mycophenolate  500 mg Oral BID  . simvastatin  20 mg Oral QHS  . sodium chloride  3 mL Intravenous Q12H  . tacrolimus  2 mg Oral QHS  . tacrolimus  2.5 mg Oral Daily    Continuous:   No results found for this or any previous visit (from the past 24 hour(s)).   Nm Hepato W/eject Fract  03/18/2014   CLINICAL DATA:  Cholelithiasis, abdominal pain  EXAM: NUCLEAR MEDICINE HEPATOBILIARY IMAGING WITH GALLBLADDER EF  TECHNIQUE: Sequential images of the abdomen were obtained out to 60 minutes following intravenous administration of radiopharmaceutical. After slow intravenous infusion of 1.5 micrograms Cholecystokinin, gallbladder ejection fraction was determined.  RADIOPHARMACEUTICALS:  5.0 Millicurie Tc-5133m Choletec  COMPARISON:  CT scan 03/12/2014  FINDINGS: There is normal uptake of the tracer by the liver. Gallbladder and CBD visualized at 25 min. Bowel activity noted at 30 min. Post CCK gallbladder ejection fraction is 95.6%. At 30 min, normal ejection fraction is greater than 30%.  The patient did not experience symptoms during CCK infusion.  IMPRESSION: No cystic duct obstruction. Post CCK gallbladder ejection fraction is 95.6%.   Electronically Signed   By: [name]  Pop [name]   On: 03/18/2014 16:50    ROS:  As stated above in the HPI otherwise negative.  Blood pressure 132/51, pulse 70, temperature 98.3 F (36.8 C), temperature source Oral, resp. rate 18, height 5\' 2"  (1.575 m), weight 166 lb 7.2 oz (75.5 kg), SpO2 100.00%.    PE: Gen: NAD, Alert and Oriented HEENT:  [place]/AT, EOMI Neck: Supple, no  LAD Lungs: CTA Bilaterally CV: RRR without M/G/R ABM: Soft, epigastric tenderness, +BS Ext: No C/C/E  Assessment/Plan: 1) Epigastric pain. 2) Nausea   With her symptoms it is reasonable to perform an EGD for further evaluation.  I am uncertain about the etiology of her current symptoms.  Plan: 1) EGD tomorrow.  Teodoro Jeffreys D 03/19/2014, 4:05 PM

## 2014-03-20 NOTE — Op Note (Signed)
[facility] [address] [place], [id number]   OPERATIVE PROCEDURE REPORT  PATIENT: [name], [name]  MR#: [id number] BIRTHDATE: 04-29-1932  GENDER: Female ENDOSCOPIST: Jeani HawkingPatrick Jacklyne Baik, MD ASSISTANT: PROCEDURE DATE: 03/20/2014 PROCEDURE:   EGD, diagnostic ASA CLASS:   Class III INDICATIONS:Epigastric pain. MEDICATIONS: MAC sedation, administered by CRNA TOPICAL ANESTHETIC:  DESCRIPTION OF PROCEDURE:   After the risks benefits and alternatives of the procedure were thoroughly explained, informed consent was obtained.  The Pentax Gastroscope [id number]  endoscope was introduced through the mouth  and advanced to the second portion of the duodenum Without limitations.      The instrument was slowly withdrawn as the mucosa was fully examined.      FINDINGS: The esophagus exhibited a mild to moderate Candidal esophagitis.  No evidence of any reflux esophagitis.  In the gastric fundus a mild erythema was noted.  No other abnormalities identified in the upper GI tract.          The scope was then withdrawn from the patient and the procedure terminated.  COMPLICATIONS: There were no complications.  IMPRESSION: 1) Candidal esophagitis. 2) Minimal fundic gastritis.  RECOMMENDATIONS: 1) Fluconazole 200 mg QD x 10 days.  _______________________________ Rosalie DoctoreSignedJeani Hawking:  Deidrick Rainey, MD 03/20/2014 1:00 PM

## 2014-03-21 ENCOUNTER — Encounter (HOSPITAL_COMMUNITY): Payer: Self-pay | Admitting: Gastroenterology

## 2014-03-21 LAB — STOOL CULTURE

## 2014-03-21 MED ORDER — TACROLIMUS 1 MG PO CAPS
1.5000 mg | ORAL_CAPSULE | Freq: Two times a day (BID) | ORAL | Status: DC
  Filled 2014-03-21: qty 1

## 2014-03-21 MED ORDER — TACROLIMUS 1 MG PO CAPS: 2.0000 mg | ORAL_CAPSULE | Freq: Two times a day (BID) | ORAL | Status: DC

## 2014-03-21 MED ORDER — TACROLIMUS 0.5 MG PO CAPS: 0.5000 mg | ORAL_CAPSULE | Freq: Every day | ORAL | Status: DC

## 2014-03-21 MED ORDER — TACROLIMUS 1 MG PO CAPS: 1.0000 mg | ORAL_CAPSULE | Freq: Two times a day (BID) | ORAL | Status: DC

## 2014-03-21 MED ORDER — PANTOPRAZOLE SODIUM 20 MG PO TBEC: 20.0000 mg | DELAYED_RELEASE_TABLET | Freq: Every day | ORAL | Status: DC

## 2014-03-21 MED ORDER — SENNOSIDES-DOCUSATE SODIUM 8.6-50 MG PO TABS: 1.0000 | ORAL_TABLET | Freq: Every day | ORAL | Status: DC | PRN

## 2014-03-21 MED ORDER — FLUCONAZOLE 200 MG PO TABS: 200.0000 mg | ORAL_TABLET | Freq: Every day | ORAL | Status: AC

## 2014-03-21 MED ORDER — FLUCONAZOLE 200 MG PO TABS: 200.0000 mg | ORAL_TABLET | Freq: Every day | ORAL | Status: DC

## 2014-03-21 MED ORDER — ACETAMINOPHEN 500 MG PO TABS: 500.0000 mg | ORAL_TABLET | Freq: Four times a day (QID) | ORAL | Status: DC | PRN

## 2014-03-21 NOTE — Transfer of Care (Signed)
Immediate Anesthesia Transfer of Care Note  Patient: [name]  Procedure(s) Performed: Procedure(s): ESOPHAGOGASTRODUODENOSCOPY (EGD) (N/A)  Patient Location: PACU  Anesthesia Type:MAC  Level of Consciousness: awake  Airway & Oxygen Therapy: Patient Spontanous Breathing  Post-op Assessment: Report given to PACU RN  Post vital signs: Reviewed and stable  Complications: No apparent anesthesia complications

## 2014-03-21 NOTE — Progress Notes (Signed)
Pt discharged home with granddaughter. D/c instructions reviewed with pt. No concerns. All questions were answered

## 2014-03-21 NOTE — Progress Notes (Signed)
  I have seen and examined the patient, and reviewed the daily progress note by Staci RighterAndrew Miller, MS III and discussed the care of the patient with them. Please see my progress note from 03/21/2014 for further details regarding assessment and plan.    Signed:  Jill AlexandersAlexa Richardson, DO PGY-1 Internal Medicine Resident Pager # [phone] 03/21/2014 10:56 AM

## 2014-03-21 NOTE — Progress Notes (Signed)
  PROGRESS NOTE MEDICINE TEACHING ATTENDING   Day 39 of stay Patient name: [name]   Medical record number: [id number] Date of birth: 1932-04-29    Patient feels much better today. EGD diagnosed candida esophagitis. Fluconazole started, patient eating scrambled eggs for breakfast today and able to tolerate it. Vitals stable. On exam, alert and oriented, lungs clear to auscultation bilaterally, heart regular rate and rhythm, no murmur, abdominal tenderness very mild, mild pedal edema.  Assessment and Plan                                                                      Discharge on Fluconazole for candida esophagitis today.  I have discussed the care of this patient with my IM team residents - Dr Glenard HaringModing and Dr Senaida Oresichardson. Please see Dr Berenda Moraleichardson's note for details.  Lavanda Nevels 03/21/2014, 10:13 AM.

## 2014-03-21 NOTE — Care Management Note (Signed)
CARE MANAGEMENT NOTE 03/21/2014  Patient:  [name], [name]   Account Number:  [id number]  Date Initiated:  03/15/2014  Documentation initiated by:  Glendal Cassaday  Subjective/Objective Assessment:   CM following for progression and d/c planning.     Action/Plan:   03/15/2014 Met with pt no d/c needs identified at this time, pt lives with grandson who is very support, plans to return to home. IM given and explained.   Anticipated DC Date:  03/21/2014   Anticipated DC Plan:  [place]         Choice offered to / List presented to:          [facility] arranged  HH-1 RN      [place].   Status of service:   Medicare Important Message given?  YES (If response is "NO", the following Medicare IM given date fields will be blank) Date Medicare IM given:  03/15/2014 Medicare IM given by:  Zayvian Mcmurtry Date Additional Medicare IM given:  03/18/2014 Additional Medicare IM given by:  Jonnie Finner  Discharge Disposition:    Per UR Regulation:    If discussed at Long Length of Stay Meetings, dates discussed:    Comments:  03/21/2014 Met with pt and IM given again and explained, pt is very anxious to d/c to home, will ask for [facility] for assessment and eval in the home. Jasmine Pang RN MPH, case manager, [phone]

## 2014-03-21 NOTE — Progress Notes (Signed)
Subjective: Feeling much better.  Objective: Vital signs in last 24 hours: Temp:  [97.5 F (36.4 C)-98 F (36.7 C)] 98 F (36.7 C) (08/20 0631) Pulse Rate:  [52-79] 69 (08/20 0631) Resp:  [14-19] 16 (08/20 0631) BP: (122-172)/(44-72) 143/62 mmHg (08/20 0631) SpO2:  [97 %-100 %] 100 % (08/20 0631) Weight:  [166 lb 7.2 oz (75.501 kg)] 166 lb 7.2 oz (75.501 kg) (08/19 2126) Last BM Date: 03/17/14  Intake/Output from previous day: 08/19 0701 - 08/20 0700 In: 300 [I.V.:300] Out: -  Intake/Output this shift:    General appearance: alert and no distress GI: soft, non-tender; bowel sounds normal; no masses,  no organomegaly  Lab Results:  Recent Labs  03/18/14 1240 03/20/14 0500  WBC 5.7 6.6  HGB 11.9* 11.3*  HCT 36.3 34.0*  PLT 212 206   BMET  Recent Labs  03/20/14 0500  NA 136*  K 4.8  CL 104  CO2 20  GLUCOSE 102*  BUN 10  CREATININE 0.95  CALCIUM 9.6   LFT  Recent Labs  03/20/14 0500  ALBUMIN 2.8*   PT/INR No results found for this basename: LABPROT, INR,  in the last 72 hours Hepatitis Panel No results found for this basename: HEPBSAG, HCVAB, HEPAIGM, HEPBIGM,  in the last 72 hours C-Diff No results found for this basename: CDIFFTOX,  in the last 72 hours Fecal Lactopherrin No results found for this basename: FECLLACTOFRN,  in the last 72 hours  Studies/Results: No results found.  Medications:  Scheduled: . amLODipine  10 mg Oral Daily  . aspirin EC  81 mg Oral Daily  . fluconazole  200 mg Oral Daily  . heparin  5,000 Units Subcutaneous 3 times per day  . losartan  100 mg Oral Daily  . metoprolol  50 mg Oral BID  . mycophenolate  500 mg Oral BID  . pantoprazole  20 mg Oral Daily  . senna-docusate  1 tablet Oral Daily  . simvastatin  20 mg Oral QHS  . sodium chloride  3 mL Intravenous Q12H  . tacrolimus  2 mg Oral QHS  . tacrolimus  2.5 mg Oral Daily   Continuous:   Assessment/Plan: 1) Candidal esophagitis. 2) Nausea. 3) Epigastric  pain.   The patient's symptoms have resolved.  She feels well at this time.  Plan: 1) Fluconazole 200 mg QD for a total of 10 days.  Please make sure that there is no interaction with her tacrolimus or mycophenolate before D/C. 2) Patient can follow up with Nephrology or PCP. 3) Signing off.  LOS: 9 days   Sumi Lye D 03/21/2014, 6:52 AM

## 2014-03-21 NOTE — Progress Notes (Signed)
Subjective: [name] is an 80yo woman on hospital day 9 who was admitted for epigastric pain, nausea, and vomiting secondary to gastritis and candidal esophagitis. This morning the patient said that she felt great and was ready to go home. She was preparing to eat breakfast and expected to tolerate a normal diet. She denied any pain, nausea, vomiting, or dysphagia. [name] has not has a significant bowel movement since entering the hospital 9 days ago.  Objective: Vital signs in last 24 hours: Filed Vitals:   03/20/14 1332 03/20/14 1348 03/20/14 2126 03/21/14 0631  BP: 134/53 134/44 129/55 143/62  Pulse: 64 63 79 69  Temp:  97.5 F (36.4 C) 98 F (36.7 C) 98 F (36.7 C)  TempSrc:  Oral Oral Oral  Resp: 18 16 16 16   Height:      Weight:   75.501 kg (166 lb 7.2 oz)   SpO2: 98% 100% 100% 100%   Weight change: 0 kg (0 lb)  Intake/Output Summary (Last 24 hours) at 03/21/14 0952 Last data filed at 03/21/14 [id number]  Gross per 24 hour  Intake    300 ml  Output      0 ml  Net    300 ml   General appearance: alert, cooperative and no distress Lungs: clear to auscultation bilaterally Heart: regular rate and rhythm, S1, S2 normal, no murmur, click, rub or gallop Abdomen: Tender abdomen with moderate palpation in the mid epigastric area, no guarding Lab Results: Basic Metabolic Panel:  Recent Labs Lab 03/16/14 0510  03/18/14 0530 03/20/14 0500  NA 138  < > 134* 136*  K 4.1  < > 4.3 4.8  CL 105  < > 103 104  CO2 20  < > 19 20  GLUCOSE 94  < > 87 102*  BUN 15  < > 10 10  CREATININE 0.96  < > 0.90 0.95  CALCIUM 9.9  < > 9.8 9.6  PHOS 2.2*  --   --  2.2*  < > = values in this interval not displayed. Liver Function Tests:  Recent Labs Lab 03/16/14 0510 03/17/14 0812 03/20/14 0500  AST 29 20  --   ALT 6 6  --   ALKPHOS 116 114  --   BILITOT 0.7 0.7  --   PROT 6.4 6.0  --   ALBUMIN 3.1*  3.2* 2.9* 2.8*   No results found for this basename: LIPASE, AMYLASE,  in the  last 168 hours No results found for this basename: AMMONIA,  in the last 168 hours CBC:  Recent Labs Lab 03/18/14 1240 03/20/14 0500  WBC 5.7 6.6  NEUTROABS 3.3  --   HGB 11.9* 11.3*  HCT 36.3 34.0*  MCV 89.2 87.4  PLT 212 206   Cardiac Enzymes: No results found for this basename: CKTOTAL, CKMB, CKMBINDEX, TROPONINI,  in the last 168 hours BNP: No results found for this basename: PROBNP,  in the last 168 hours D-Dimer: No results found for this basename: DDIMER,  in the last 168 hours CBG: No results found for this basename: GLUCAP,  in the last 168 hours Hemoglobin A1C: No results found for this basename: HGBA1C,  in the last 168 hours Fasting Lipid Panel: No results found for this basename: CHOL, HDL, LDLCALC, TRIG, CHOLHDL, LDLDIRECT,  in the last 168 hours Thyroid Function Tests:  Recent Labs Lab 03/20/14 1135  TSH 1.510   Coagulation: No results found for this basename: LABPROT, INR,  in the last 168 hours Anemia  Panel: No results found for this basename: VITAMINB12, FOLATE, FERRITIN, TIBC, IRON, RETICCTPCT,  in the last 168 hours Urine Drug Screen: Drugs of Abuse  No results found for this basename: labopia, cocainscrnur, labbenz, amphetmu, thcu, labbarb    Alcohol Level: No results found for this basename: ETH,  in the last 168 hours Urinalysis: No results found for this basename: COLORURINE, APPERANCEUR, LABSPEC, PHURINE, GLUCOSEU, HGBUR, BILIRUBINUR, KETONESUR, PROTEINUR, UROBILINOGEN, NITRITE, LEUKOCYTESUR,  in the last 168 hours Misc. Labs:   Micro Results: Recent Results (from the past 240 hour(s))  CLOSTRIDIUM DIFFICILE BY PCR     Status: None   Collection Time    03/17/14  8:24 AM      Result Value Ref Range Status   C difficile by pcr NEGATIVE  NEGATIVE Final  STOOL CULTURE     Status: None   Collection Time    03/17/14  8:24 AM      Result Value Ref Range Status   Specimen Description STOOL   Final   Special Requests NONE   Final   Culture      Final   Value: NO SALMONELLA, SHIGELLA, CAMPYLOBACTER, YERSINIA, OR E.COLI 0157:H7 ISOLATED     Performed at [facility]   Report Status 03/21/2014 FINAL   Final   Studies/Results: No results found. Medications: I have reviewed the patient's current medications. Scheduled Meds: . amLODipine  10 mg Oral Daily  . aspirin EC  81 mg Oral Daily  . fluconazole  200 mg Oral Daily  . heparin  5,000 Units Subcutaneous 3 times per day  . losartan  100 mg Oral Daily  . metoprolol  50 mg Oral BID  . mycophenolate  500 mg Oral BID  . pantoprazole  20 mg Oral Daily  . senna-docusate  1 tablet Oral Daily  . simvastatin  20 mg Oral QHS  . sodium chloride  3 mL Intravenous Q12H  . tacrolimus  2 mg Oral QHS  . tacrolimus  2.5 mg Oral Daily   Continuous Infusions:  PRN Meds:.gi cocktail, HYDROmorphone (DILAUDID) injection, meclizine, ondansetron (ZOFRAN) IV, promethazine Assessment/Plan: Principal Problem:   Candidal esophagitis Active Problems:   S/P kidney transplant   Diastolic CHF   CAD (coronary artery disease)   Hypertension   Gastritis determined by endoscopy  Esophagitis: Patient is being discharged on Fluconazole 200mg  x10 days  Gastritis: Likely due to irritation from the candidal esophagitis and should spontaneously resolve. If symptoms persist after discharge it would warrant further workup.  S/P Kidney Transplant: [name] is currently on Mycophenolate and Tacrolimus for rejection prevention. Tacrolimus is a major substrate of  CYP3A4 which Fluconazole inhibits. Tacrolimus dosing should be halved until her antifungal regimen is completed.  CHF, CAD, HTN:  No complaints of shortness of breath or substernal chest pain. Patient is currently being managed with:  -Simvastatin 20mg   -Losartan 100mg   -Aspirin 81mg   -Metoprolol 50mg   Constipation: [name]' lack of bowel movements is concerning, but could be due to her minimal intake during her hospital stay.  Should she continue to have problems after discharge or develop symptoms of bloating or fullness she should take an OTC laxative or contact her PCP.   This is a Psychologist, occupational Note.  The care of the patient was discussed with Dr. Senaida [name] and the assessment and plan formulated with their assistance.  Please see their attached note for official documentation of the daily encounter.   LOS: 9 days   Chiquita Loth, Med Student  03/21/2014, 9:52 AM

## 2014-03-21 NOTE — Progress Notes (Signed)
Subjective:  Patient was seen and examined this morning. Patient is doing well and is ready to go home. She denies any nausea, vomiting, abdominal pain or diarrhea. She does complain of small, hard stools.   Objective: Vital signs in last 24 hours: Filed Vitals:   03/20/14 1332 03/20/14 1348 03/20/14 2126 03/21/14 0631  BP: 134/53 134/44 129/55 143/62  Pulse: 64 63 79 69  Temp:  97.5 F (36.4 C) 98 F (36.7 C) 98 F (36.7 C)  TempSrc:  Oral Oral Oral  Resp: 18 16 16 16   Height:      Weight:   166 lb 7.2 oz (75.501 kg)   SpO2: 98% 100% 100% 100%    Intake/Output Summary (Last 24 hours) at 03/21/14 1002 Last data filed at 03/21/14 [id number]  Gross per 24 hour  Intake    300 ml  Output      0 ml  Net    300 ml   General: Vital signs reviewed.  Patient is well-developed and well-nourished, in no acute distress.  Cardiovascular: RRR, S1 normal, S2 normal, no murmurs, gallops, or rubs. Pulmonary/Chest: Clear to auscultation bilaterally, no wheezes, rales, or rhonchi. Abdominal: Soft, mildly tender to palpation in epigastric region, non-distended, decreased BS, no guarding present. Surgical scar present over RUQ/LUQ.  Musculoskeletal: No joint deformities, erythema, or stiffness, ROM full and nontender. Extremities:Trace edema in lower extremities bilaterally, no calf tenderness bilaterally,  pulses symmetric and intact bilaterally. No cyanosis or clubbing. Neurological: A&O x3, Strength is normal and symmetric bilaterally, no focal motor deficit, sensory intact to light touch bilaterally.  Skin: Warm, dry and intact. No rashes or erythema. Psychiatric: Normal mood and affect. speech and behavior is normal. Cognition and memory are normal.    Lab Results: Basic Metabolic Panel:  Recent Labs Lab 03/16/14 0510  03/18/14 0530 03/20/14 0500  NA 138  < > 134* 136*  K 4.1  < > 4.3 4.8  CL 105  < > 103 104  CO2 20  < > 19 20  GLUCOSE 94  < > 87 102*  BUN 15  < > 10 10  CREATININE  0.96  < > 0.90 0.95  CALCIUM 9.9  < > 9.8 9.6  PHOS 2.2*  --   --  2.2*  < > = values in this interval not displayed. Liver Function Tests:  Recent Labs Lab 03/16/14 0510 03/17/14 0812 03/20/14 0500  AST 29 20  --   ALT 6 6  --   ALKPHOS 116 114  --   BILITOT 0.7 0.7  --   PROT 6.4 6.0  --   ALBUMIN 3.1*  3.2* 2.9* 2.8*   CBC:  Recent Labs Lab 03/18/14 1240 03/20/14 0500  WBC 5.7 6.6  NEUTROABS 3.3  --   HGB 11.9* 11.3*  HCT 36.3 34.0*  MCV 89.2 87.4  PLT 212 206   Medications: I have reviewed the patient's current medications.  Prescriptions prior to admission  Medication Sig Dispense Refill  . acetaminophen (TYLENOL) 500 MG tablet Take 500 mg by mouth every 6 (six) hours as needed for mild pain.      Marland Kitchen. allopurinol (ZYLOPRIM) 100 MG tablet Take 100 mg by mouth at bedtime.      Marland Kitchen. amLODipine (NORVASC) 10 MG tablet Take 5 mg by mouth Daily.       Marland Kitchen. aspirin EC 81 MG tablet Take 81 mg by mouth daily.      . cinacalcet (SENSIPAR) 90 MG tablet Take  90 mg by mouth daily.      . colchicine 0.6 MG tablet Take 0.6 mg by mouth daily as needed. For gout      . docusate sodium (COLACE) 100 MG capsule Take 100 mg by mouth 2 (two) times daily as needed. For constipation      . famotidine (PEPCID) 20 MG tablet Take 20 mg by mouth 2 (two) times daily.      . furosemide (LASIX) 80 MG tablet Take 80 mg by mouth 2 (two) times daily.       Marland Kitchen losartan (COZAAR) 100 MG tablet Take 100 mg by mouth daily.      . metoprolol (LOPRESSOR) 50 MG tablet Take 100 mg by mouth 2 (two) times daily.      . mycophenolate (CELLCEPT) 250 MG capsule Take 500 mg by mouth 2 (two) times daily.      . simvastatin (ZOCOR) 20 MG tablet Take 20 mg by mouth at bedtime.       . [DISCONTINUED] tacrolimus (PROGRAF) 0.5 MG capsule Take 0.5 mg by mouth daily. Take 1 capsule by mouth every morning along with two of the 1mg  capsules for a total dose of 2.5mg       . [DISCONTINUED] tacrolimus (PROGRAF) 1 MG capsule Take 2  mg by mouth 2 (two) times daily.         Scheduled Meds: . amLODipine  10 mg Oral Daily  . aspirin EC  81 mg Oral Daily  . fluconazole  200 mg Oral Daily  . heparin  5,000 Units Subcutaneous 3 times per day  . losartan  100 mg Oral Daily  . metoprolol  50 mg Oral BID  . mycophenolate  500 mg Oral BID  . pantoprazole  20 mg Oral Daily  . senna-docusate  1 tablet Oral Daily  . simvastatin  20 mg Oral QHS  . sodium chloride  3 mL Intravenous Q12H  . tacrolimus  2 mg Oral QHS  . tacrolimus  2.5 mg Oral Daily   Continuous Infusions:   PRN Meds:.gi cocktail, HYDROmorphone (DILAUDID) injection, meclizine, ondansetron (ZOFRAN) IV, promethazine Assessment/Plan:  Candidal Esophagitis with Mild Fundal Gastritis: Patient had an EGD yesterday which showed candidal esophagitis and mild fundal gastritis. GI recommended Fluconazole 200 mg daily for 10 days. GI has signed off. We also started her on protonix 20 mg daily. Patient has been educated on her diagnosis and she reports no further questions. She is comfortable with the plan.  Fluconazole is a CYP450 inhibitor and Prograf is normally metabolized by CYP450. We will need to decrease her daily dose of Prograf in order to avoid Tacrolimus Toxicity. Patient will take Prograf 1.5 mg BID until she completes Fluconazole therapy and then 2 days after and then she will resume her normal dose on 04/02/2014. -Patient will follow up with Dr. Danella Penton on 03/28/14 at 10:00 am  Diastolic CHF, CAD: Troponins negative x 3. EKG with new slight ST depression in I, II, III, avf, and slight ST elevation in V1, V2, V3. Does not appear to be in acute exacerbation as she appears hypovolemic-euvolemic on exam. Repeat EKG is similar to prior. No complaints of chest pain or shortness of breath. We will continue and restart all of her medications on discharge. -losartan 100mg    -metoprolol 50mg  BID -simvastatin 20mg  -ASA 81  -furosemide 80mg  BID HELD  -Hold off on IV fluids  unless necessary  HTN: Continued her home medications, but holding furosemide due to hypovolemia/euvolemia. SBP has been 120s-140s  overnight. -amlodipine 10mg    -losartan 100mg   -metoprolol 50mg  BID -furosemide 80mg  BID HELD  Focal sclerosing glomerulonephritis s/p kidney transplant in 2003: Patient is on Cellcept and Tacrolimus. She was being followed by Nephrology. Creatinine has been stable, 0.80-1.1 during admission. Fluconazole is a CYP450 inhibitor and Prograf is normally metabolized by CYP450. We will need to decrease her daily dose of Prograf in order to avoid Tacrolimus Toxicity. Patient will take Prograf 1.5 mg BID until she completes Fluconazole therapy and then 2 days after and then she will resume her normal dose on 04/02/2014. -Continue Cellcept and Tacrolimus po -Nephrology has signed off -Follow up outpatient with Dr. Briant Cedar, Nephrology  FEN: Regular diet  DVT ppx: subq hep 5000u TID  Dispo: Discharge home today.  The patient does not have a current PCP (No primary provider on file.) and does not need an [facility] hospital follow-up appointment after discharge.  The patient does not have transportation limitations that hinder transportation to clinic appointments.  .Services Needed at time of discharge: Y = Yes, Blank = No PT:   OT:   RN:   Equipment:   Other:     LOS: 9 days   Jill Alexanders, DO PGY-1 Internal Medicine Resident Pager # [phone] 03/21/2014 10:02 AM

## 2014-03-22 DIAGNOSIS — I509 Heart failure, unspecified: Secondary | ICD-10-CM | POA: Diagnosis not present

## 2014-03-22 DIAGNOSIS — Z940 Kidney transplant status: Secondary | ICD-10-CM | POA: Diagnosis not present

## 2014-03-22 DIAGNOSIS — I10 Essential (primary) hypertension: Secondary | ICD-10-CM | POA: Diagnosis not present

## 2014-03-22 DIAGNOSIS — I2510 Atherosclerotic heart disease of native coronary artery without angina pectoris: Secondary | ICD-10-CM | POA: Diagnosis not present

## 2014-03-22 DIAGNOSIS — K2970 Gastritis, unspecified, without bleeding: Secondary | ICD-10-CM | POA: Diagnosis not present

## 2014-03-22 DIAGNOSIS — B3781 Candidal esophagitis: Secondary | ICD-10-CM | POA: Diagnosis not present

## 2014-03-28 ENCOUNTER — Ambulatory Visit: Payer: Medicare Other | Admitting: Internal Medicine

## 2014-03-28 DIAGNOSIS — Z940 Kidney transplant status: Secondary | ICD-10-CM | POA: Diagnosis not present

## 2014-03-28 DIAGNOSIS — B3781 Candidal esophagitis: Secondary | ICD-10-CM | POA: Diagnosis not present

## 2014-03-28 DIAGNOSIS — K2970 Gastritis, unspecified, without bleeding: Secondary | ICD-10-CM | POA: Diagnosis not present

## 2014-03-28 DIAGNOSIS — I2510 Atherosclerotic heart disease of native coronary artery without angina pectoris: Secondary | ICD-10-CM | POA: Diagnosis not present

## 2014-03-28 DIAGNOSIS — I10 Essential (primary) hypertension: Secondary | ICD-10-CM | POA: Diagnosis not present

## 2014-03-28 DIAGNOSIS — I509 Heart failure, unspecified: Secondary | ICD-10-CM | POA: Diagnosis not present

## 2014-04-01 DIAGNOSIS — K2970 Gastritis, unspecified, without bleeding: Secondary | ICD-10-CM | POA: Diagnosis not present

## 2014-04-01 DIAGNOSIS — I10 Essential (primary) hypertension: Secondary | ICD-10-CM | POA: Diagnosis not present

## 2014-04-01 DIAGNOSIS — Z940 Kidney transplant status: Secondary | ICD-10-CM | POA: Diagnosis not present

## 2014-04-01 DIAGNOSIS — I509 Heart failure, unspecified: Secondary | ICD-10-CM | POA: Diagnosis not present

## 2014-04-01 DIAGNOSIS — I2510 Atherosclerotic heart disease of native coronary artery without angina pectoris: Secondary | ICD-10-CM | POA: Diagnosis not present

## 2014-04-01 DIAGNOSIS — B3781 Candidal esophagitis: Secondary | ICD-10-CM | POA: Diagnosis not present

## 2014-04-02 DIAGNOSIS — Z79899 Other long term (current) drug therapy: Secondary | ICD-10-CM | POA: Diagnosis not present

## 2014-04-09 DIAGNOSIS — Z940 Kidney transplant status: Secondary | ICD-10-CM | POA: Diagnosis not present

## 2014-04-17 DIAGNOSIS — I2510 Atherosclerotic heart disease of native coronary artery without angina pectoris: Secondary | ICD-10-CM | POA: Diagnosis not present

## 2014-04-17 DIAGNOSIS — K2970 Gastritis, unspecified, without bleeding: Secondary | ICD-10-CM | POA: Diagnosis not present

## 2014-04-17 DIAGNOSIS — I509 Heart failure, unspecified: Secondary | ICD-10-CM | POA: Diagnosis not present

## 2014-04-17 DIAGNOSIS — I10 Essential (primary) hypertension: Secondary | ICD-10-CM | POA: Diagnosis not present

## 2014-04-17 DIAGNOSIS — Z940 Kidney transplant status: Secondary | ICD-10-CM | POA: Diagnosis not present

## 2014-04-17 DIAGNOSIS — B3781 Candidal esophagitis: Secondary | ICD-10-CM | POA: Diagnosis not present

## 2014-04-22 DIAGNOSIS — Z79899 Other long term (current) drug therapy: Secondary | ICD-10-CM | POA: Diagnosis not present

## 2014-04-30 DIAGNOSIS — Z940 Kidney transplant status: Secondary | ICD-10-CM | POA: Diagnosis not present

## 2014-04-30 DIAGNOSIS — I509 Heart failure, unspecified: Secondary | ICD-10-CM | POA: Diagnosis not present

## 2014-04-30 DIAGNOSIS — I2510 Atherosclerotic heart disease of native coronary artery without angina pectoris: Secondary | ICD-10-CM | POA: Diagnosis not present

## 2014-04-30 DIAGNOSIS — B3781 Candidal esophagitis: Secondary | ICD-10-CM | POA: Diagnosis not present

## 2014-04-30 DIAGNOSIS — K2970 Gastritis, unspecified, without bleeding: Secondary | ICD-10-CM | POA: Diagnosis not present

## 2014-04-30 DIAGNOSIS — I10 Essential (primary) hypertension: Secondary | ICD-10-CM | POA: Diagnosis not present

## 2014-05-09 DIAGNOSIS — Z940 Kidney transplant status: Secondary | ICD-10-CM | POA: Diagnosis not present

## 2014-05-14 DIAGNOSIS — Z940 Kidney transplant status: Secondary | ICD-10-CM | POA: Diagnosis not present

## 2014-05-14 DIAGNOSIS — N183 Chronic kidney disease, stage 3 (moderate): Secondary | ICD-10-CM | POA: Diagnosis not present

## 2014-05-14 DIAGNOSIS — E212 Other hyperparathyroidism: Secondary | ICD-10-CM | POA: Diagnosis not present

## 2014-05-14 DIAGNOSIS — Z23 Encounter for immunization: Secondary | ICD-10-CM | POA: Diagnosis not present

## 2014-05-14 DIAGNOSIS — I10 Essential (primary) hypertension: Secondary | ICD-10-CM | POA: Diagnosis not present

## 2014-05-16 DIAGNOSIS — Z940 Kidney transplant status: Secondary | ICD-10-CM | POA: Diagnosis not present

## 2014-05-16 DIAGNOSIS — I509 Heart failure, unspecified: Secondary | ICD-10-CM | POA: Diagnosis not present

## 2014-05-16 DIAGNOSIS — I2510 Atherosclerotic heart disease of native coronary artery without angina pectoris: Secondary | ICD-10-CM | POA: Diagnosis not present

## 2014-05-16 DIAGNOSIS — I10 Essential (primary) hypertension: Secondary | ICD-10-CM | POA: Diagnosis not present

## 2014-05-16 DIAGNOSIS — K2970 Gastritis, unspecified, without bleeding: Secondary | ICD-10-CM | POA: Diagnosis not present

## 2014-05-16 DIAGNOSIS — B3781 Candidal esophagitis: Secondary | ICD-10-CM | POA: Diagnosis not present

## 2014-05-20 DIAGNOSIS — Z79899 Other long term (current) drug therapy: Secondary | ICD-10-CM | POA: Diagnosis not present

## 2014-05-20 DIAGNOSIS — Z940 Kidney transplant status: Secondary | ICD-10-CM | POA: Diagnosis not present

## 2014-05-20 DIAGNOSIS — E212 Other hyperparathyroidism: Secondary | ICD-10-CM | POA: Diagnosis not present

## 2014-07-11 ENCOUNTER — Encounter: Payer: Self-pay | Admitting: *Deleted

## 2014-07-12 ENCOUNTER — Encounter: Payer: Self-pay | Admitting: *Deleted

## 2014-07-17 ENCOUNTER — Encounter: Payer: Medicare Other | Admitting: Internal Medicine

## 2014-08-11 ENCOUNTER — Emergency Department (HOSPITAL_COMMUNITY): Payer: Medicare Other

## 2014-08-11 ENCOUNTER — Inpatient Hospital Stay (HOSPITAL_COMMUNITY)
Admission: EM | Admit: 2014-08-11 | Discharge: 2014-08-17 | DRG: 292 | Disposition: A | Discharge status: Home / Self Care | Payer: Medicare Other | Attending: Oncology | Admitting: Oncology

## 2014-08-11 ENCOUNTER — Encounter (HOSPITAL_COMMUNITY): Payer: Self-pay | Admitting: Internal Medicine

## 2014-08-11 DIAGNOSIS — Z882 Allergy status to sulfonamides status: Secondary | ICD-10-CM | POA: Diagnosis not present

## 2014-08-11 DIAGNOSIS — I5033 Acute on chronic diastolic (congestive) heart failure: Secondary | ICD-10-CM | POA: Diagnosis present

## 2014-08-11 DIAGNOSIS — J811 Chronic pulmonary edema: Secondary | ICD-10-CM | POA: Diagnosis not present

## 2014-08-11 DIAGNOSIS — E876 Hypokalemia: Secondary | ICD-10-CM | POA: Diagnosis not present

## 2014-08-11 DIAGNOSIS — N185 Chronic kidney disease, stage 5: Secondary | ICD-10-CM | POA: Diagnosis not present

## 2014-08-11 DIAGNOSIS — N189 Chronic kidney disease, unspecified: Secondary | ICD-10-CM | POA: Diagnosis present

## 2014-08-11 DIAGNOSIS — Z7982 Long term (current) use of aspirin: Secondary | ICD-10-CM

## 2014-08-11 DIAGNOSIS — I130 Hypertensive heart and chronic kidney disease with heart failure and stage 1 through stage 4 chronic kidney disease, or unspecified chronic kidney disease: Secondary | ICD-10-CM | POA: Diagnosis not present

## 2014-08-11 DIAGNOSIS — N179 Acute kidney failure, unspecified: Secondary | ICD-10-CM | POA: Diagnosis present

## 2014-08-11 DIAGNOSIS — J209 Acute bronchitis, unspecified: Secondary | ICD-10-CM | POA: Diagnosis present

## 2014-08-11 DIAGNOSIS — J208 Acute bronchitis due to other specified organisms: Secondary | ICD-10-CM | POA: Diagnosis present

## 2014-08-11 DIAGNOSIS — K5900 Constipation, unspecified: Secondary | ICD-10-CM | POA: Diagnosis present

## 2014-08-11 DIAGNOSIS — R0789 Other chest pain: Secondary | ICD-10-CM | POA: Diagnosis not present

## 2014-08-11 DIAGNOSIS — I517 Cardiomegaly: Secondary | ICD-10-CM | POA: Diagnosis not present

## 2014-08-11 DIAGNOSIS — I129 Hypertensive chronic kidney disease with stage 1 through stage 4 chronic kidney disease, or unspecified chronic kidney disease: Secondary | ICD-10-CM | POA: Diagnosis present

## 2014-08-11 DIAGNOSIS — Z9861 Coronary angioplasty status: Secondary | ICD-10-CM

## 2014-08-11 DIAGNOSIS — I509 Heart failure, unspecified: Secondary | ICD-10-CM | POA: Diagnosis not present

## 2014-08-11 DIAGNOSIS — T8610 Unspecified complication of kidney transplant: Secondary | ICD-10-CM

## 2014-08-11 DIAGNOSIS — R7989 Other specified abnormal findings of blood chemistry: Secondary | ICD-10-CM

## 2014-08-11 DIAGNOSIS — Z87891 Personal history of nicotine dependence: Secondary | ICD-10-CM

## 2014-08-11 DIAGNOSIS — T363X5A Adverse effect of macrolides, initial encounter: Secondary | ICD-10-CM | POA: Diagnosis present

## 2014-08-11 DIAGNOSIS — E119 Type 2 diabetes mellitus without complications: Secondary | ICD-10-CM | POA: Diagnosis present

## 2014-08-11 DIAGNOSIS — N289 Disorder of kidney and ureter, unspecified: Secondary | ICD-10-CM

## 2014-08-11 DIAGNOSIS — I120 Hypertensive chronic kidney disease with stage 5 chronic kidney disease or end stage renal disease: Secondary | ICD-10-CM | POA: Diagnosis not present

## 2014-08-11 DIAGNOSIS — I248 Other forms of acute ischemic heart disease: Secondary | ICD-10-CM | POA: Diagnosis present

## 2014-08-11 DIAGNOSIS — I2510 Atherosclerotic heart disease of native coronary artery without angina pectoris: Secondary | ICD-10-CM

## 2014-08-11 DIAGNOSIS — Z90710 Acquired absence of both cervix and uterus: Secondary | ICD-10-CM | POA: Diagnosis not present

## 2014-08-11 DIAGNOSIS — I369 Nonrheumatic tricuspid valve disorder, unspecified: Secondary | ICD-10-CM | POA: Diagnosis not present

## 2014-08-11 DIAGNOSIS — R0602 Shortness of breath: Secondary | ICD-10-CM | POA: Diagnosis not present

## 2014-08-11 DIAGNOSIS — M109 Gout, unspecified: Secondary | ICD-10-CM | POA: Diagnosis present

## 2014-08-11 DIAGNOSIS — J9809 Other diseases of bronchus, not elsewhere classified: Secondary | ICD-10-CM | POA: Diagnosis not present

## 2014-08-11 DIAGNOSIS — I10 Essential (primary) hypertension: Secondary | ICD-10-CM | POA: Diagnosis present

## 2014-08-11 DIAGNOSIS — J40 Bronchitis, not specified as acute or chronic: Secondary | ICD-10-CM

## 2014-08-11 DIAGNOSIS — Z940 Kidney transplant status: Secondary | ICD-10-CM | POA: Diagnosis not present

## 2014-08-11 LAB — CBC
HEMATOCRIT: 36.5 % (ref 36.0–46.0)
HEMOGLOBIN: 11.9 g/dL — AB (ref 12.0–15.0)
MCH: 29 pg (ref 26.0–34.0)
MCHC: 32.6 g/dL (ref 30.0–36.0)
MCV: 89 fL (ref 78.0–100.0)
PLATELETS: 272 10*3/uL (ref 150–400)
RBC: 4.1 MIL/uL (ref 3.87–5.11)
RDW: 13.5 % (ref 11.5–15.5)
WBC: 11.4 10*3/uL — ABNORMAL HIGH (ref 4.0–10.5)

## 2014-08-11 LAB — BASIC METABOLIC PANEL
ANION GAP: 12 (ref 5–15)
BUN: 16 mg/dL (ref 6–23)
CHLORIDE: 101 meq/L (ref 96–112)
CO2: 22 mmol/L (ref 19–32)
CREATININE: 1.41 mg/dL — AB (ref 0.50–1.10)
Calcium: 10.5 mg/dL (ref 8.4–10.5)
GFR calc Af Amer: 40 mL/min — ABNORMAL LOW (ref >90)
GFR calc non Af Amer: 34 mL/min — ABNORMAL LOW (ref >90)
Glucose, Bld: 116 mg/dL — ABNORMAL HIGH (ref 70–99)
Potassium: 3.6 mmol/L (ref 3.5–5.1)
SODIUM: 135 mmol/L (ref 135–145)

## 2014-08-11 LAB — BRAIN NATRIURETIC PEPTIDE: B NATRIURETIC PEPTIDE 5: 270.9 pg/mL — AB (ref 0.0–100.0)

## 2014-08-11 LAB — I-STAT TROPONIN, ED: TROPONIN I, POC: 0.09 ng/mL — AB (ref 0.00–0.08)

## 2014-08-11 MED ORDER — TACROLIMUS 1 MG PO CAPS
1.5000 mg | ORAL_CAPSULE | Freq: Two times a day (BID) | ORAL | Status: DC
  Administered 2014-08-11 – 2014-08-12 (×2): 1.5 mg via ORAL
  Filled 2014-08-11 (×7): qty 1

## 2014-08-11 MED ORDER — PANTOPRAZOLE SODIUM 20 MG PO TBEC
20.0000 mg | DELAYED_RELEASE_TABLET | Freq: Every day | ORAL | Status: DC
Start: 2014-08-12 — End: 2014-08-17
  Administered 2014-08-12 – 2014-08-17 (×6): 20 mg via ORAL
  Filled 2014-08-11 (×9): qty 1

## 2014-08-11 MED ORDER — METOPROLOL TARTRATE 100 MG PO TABS
100.0000 mg | ORAL_TABLET | Freq: Two times a day (BID) | ORAL | Status: DC
  Administered 2014-08-12 – 2014-08-17 (×10): 100 mg via ORAL
  Filled 2014-08-11: qty 4
  Filled 2014-08-11 (×13): qty 1

## 2014-08-11 MED ORDER — TACROLIMUS 1 MG PO CAPS
2.5000 mg | ORAL_CAPSULE | Freq: Two times a day (BID) | ORAL | Status: DC
  Filled 2014-08-11: qty 1

## 2014-08-11 MED ORDER — GUAIFENESIN-CODEINE 100-10 MG/5ML PO SOLN
5.0000 mL | Freq: Four times a day (QID) | ORAL | Status: DC | PRN
  Administered 2014-08-12 – 2014-08-14 (×5): 5 mL via ORAL
  Filled 2014-08-11 (×6): qty 5

## 2014-08-11 MED ORDER — ALLOPURINOL 100 MG PO TABS
100.0000 mg | ORAL_TABLET | Freq: Every day | ORAL | Status: DC
  Administered 2014-08-12 – 2014-08-16 (×6): 100 mg via ORAL
  Filled 2014-08-11 (×7): qty 1

## 2014-08-11 MED ORDER — AMLODIPINE BESYLATE 5 MG PO TABS
5.0000 mg | ORAL_TABLET | Freq: Every day | ORAL | Status: DC
  Administered 2014-08-12 – 2014-08-14 (×3): 5 mg via ORAL
  Filled 2014-08-11 (×3): qty 1

## 2014-08-11 MED ORDER — MYCOPHENOLATE MOFETIL 250 MG PO CAPS
500.0000 mg | ORAL_CAPSULE | Freq: Two times a day (BID) | ORAL | Status: DC
  Administered 2014-08-12 – 2014-08-17 (×12): 500 mg via ORAL
  Filled 2014-08-11 (×17): qty 2

## 2014-08-11 MED ORDER — FUROSEMIDE 10 MG/ML IJ SOLN
80.0000 mg | Freq: Two times a day (BID) | INTRAMUSCULAR | Status: DC
  Administered 2014-08-12: 80 mg via INTRAVENOUS
  Filled 2014-08-11: qty 8

## 2014-08-11 MED ORDER — POTASSIUM CHLORIDE CRYS ER 20 MEQ PO TBCR
40.0000 meq | EXTENDED_RELEASE_TABLET | Freq: Once | ORAL | Status: AC
Start: 2014-08-11 — End: 2014-08-12
  Administered 2014-08-12: 40 meq via ORAL
  Filled 2014-08-11: qty 2

## 2014-08-11 MED ORDER — SENNOSIDES-DOCUSATE SODIUM 8.6-50 MG PO TABS
1.0000 | ORAL_TABLET | Freq: Every day | ORAL | Status: DC | PRN
  Filled 2014-08-11: qty 1

## 2014-08-11 MED ORDER — ASPIRIN EC 81 MG PO TBEC
81.0000 mg | DELAYED_RELEASE_TABLET | Freq: Every day | ORAL | Status: DC
  Administered 2014-08-12 – 2014-08-14 (×3): 81 mg via ORAL
  Filled 2014-08-11 (×4): qty 1

## 2014-08-11 MED ORDER — ACETAMINOPHEN 325 MG PO TABS
650.0000 mg | ORAL_TABLET | Freq: Once | ORAL | Status: AC
  Administered 2014-08-11: 650 mg via ORAL
  Filled 2014-08-11: qty 2

## 2014-08-11 MED ORDER — SODIUM CHLORIDE 0.9 % IJ SOLN
3.0000 mL | Freq: Two times a day (BID) | INTRAMUSCULAR | Status: DC
  Administered 2014-08-12 – 2014-08-16 (×9): 3 mL via INTRAVENOUS
  Filled 2014-08-11: qty 3

## 2014-08-11 MED ORDER — ENOXAPARIN SODIUM 40 MG/0.4ML ~~LOC~~ SOLN
40.0000 mg | Freq: Every day | SUBCUTANEOUS | Status: DC
  Administered 2014-08-12 – 2014-08-13 (×2): 40 mg via SUBCUTANEOUS
  Filled 2014-08-11 (×5): qty 0.4

## 2014-08-11 MED ORDER — SIMVASTATIN 20 MG PO TABS
20.0000 mg | ORAL_TABLET | Freq: Every day | ORAL | Status: DC
  Administered 2014-08-12 – 2014-08-16 (×6): 20 mg via ORAL
  Filled 2014-08-11 (×9): qty 1

## 2014-08-11 MED ORDER — FUROSEMIDE 10 MG/ML IJ SOLN
80.0000 mg | Freq: Once | INTRAMUSCULAR | Status: AC
  Administered 2014-08-11: 80 mg via INTRAVENOUS
  Filled 2014-08-11: qty 8

## 2014-08-11 MED ORDER — CINACALCET HCL 30 MG PO TABS
90.0000 mg | ORAL_TABLET | Freq: Every day | ORAL | Status: DC
  Administered 2014-08-12 – 2014-08-14 (×3): 90 mg via ORAL
  Filled 2014-08-11 (×8): qty 3

## 2014-08-11 NOTE — ED Notes (Signed)
Pt states for two weeks she has been congested and coughing. Pt c/o of headache. States she is sob breath worse while walking.

## 2014-08-11 NOTE — Consult Note (Signed)
CONSULTATION NOTE  Reason for Consult: CHF, +troponin  Requesting Physician: Dr. Waymon Budge  Cardiologist: Dr. Aundra Dubin  HPI: This is a 81 y.o. female with a past medical history significant for CAD s/p distant RCA PCI and FSGN s/p renal tranplant as well as diastolic CHF now in hospital with acute on chronic diastolic CHF. She had a similar presentation in 07/2013. Cardiac enzymes were negative during that admission, but are mildly elevated today. Echo showed normal EF with diastolic dysfunction. She now presents with 2 weeks of worsening cough and congestion. She denies any chest pain but does report shortness of breath. She also has a thick white sputum and denies fever or chills. She reports her weight is been stable. Labs indicate elevated serum creatinine of 1.4, with BNP of 270 and mildly elevated troponin of 0.09. There is a leukocytosis with white blood cell count of 11,400. Chest x-ray is concerning for acute bronchitis. EKG shows sinus rhythm with lateral ST depression and TWI's.  PMHx:  Past Medical History  Diagnosis Date  . S/P kidney transplant 2003    due to FSGN, follows with Dr. Mercy Moore  . Coronary artery disease   . Hypertension   . Cholelithiasis   . Diastolic CHF   . Gout     unconfirmed by joint aspiration  . Chronic kidney disease   . Candidal esophagitis   . Gastritis    Past Surgical History  Procedure Laterality Date  . Kidney transplant    . Abdominal hysterectomy    . Esophagogastroduodenoscopy N/A 03/20/2014    Procedure: ESOPHAGOGASTRODUODENOSCOPY (EGD);  Surgeon: Beryle Beams, MD;  Location: [facility] ENDOSCOPY;  Service: Endoscopy;  Laterality: N/A;    FAMHx: Family History  Problem Relation Age of Onset  . CAD Father   . CAD Brother     SOCHx:  reports that she quit smoking about 45 years ago. She has never used smokeless tobacco. She reports that she does not drink alcohol or use illicit drugs.  ALLERGIES: Allergies  Allergen  Reactions  . Sulfa Drugs Cross Reactors Swelling    ROS: A comprehensive review of systems was negative except for: Respiratory: positive for cough, dyspnea on exertion and wheezing  HOME MEDICATIONS:   Medication List    ASK your doctor about these medications        acetaminophen 500 MG tablet  Commonly known as:  TYLENOL  Take 1 tablet (500 mg total) by mouth every 6 (six) hours as needed for mild pain.     allopurinol 100 MG tablet  Commonly known as:  ZYLOPRIM  Take 100 mg by mouth at bedtime.     amLODipine 10 MG tablet  Commonly known as:  NORVASC  Take 5 mg by mouth Daily.     aspirin EC 81 MG tablet  Take 81 mg by mouth daily.     cinacalcet 90 MG tablet  Commonly known as:  SENSIPAR  Take 90 mg by mouth daily.     colchicine 0.6 MG tablet  Take 0.6 mg by mouth daily as needed. For gout     famotidine 20 MG tablet  Commonly known as:  PEPCID  Take 20 mg by mouth 2 (two) times daily.     furosemide 80 MG tablet  Commonly known as:  LASIX  Take 80 mg by mouth 2 (two) times daily.     losartan 100 MG tablet  Commonly known as:  COZAAR  Take 100 mg by mouth daily.  metoprolol 50 MG tablet  Commonly known as:  LOPRESSOR  Take 100 mg by mouth 2 (two) times daily.     mycophenolate 250 MG capsule  Commonly known as:  CELLCEPT  Take 500 mg by mouth 2 (two) times daily.     pantoprazole 20 MG tablet  Commonly known as:  PROTONIX  Take 1 tablet (20 mg total) by mouth daily.     senna-docusate 8.6-50 MG per tablet  Commonly known as:  Senokot-S  Take 1 tablet by mouth daily as needed for mild constipation.     simvastatin 20 MG tablet  Commonly known as:  ZOCOR  Take 20 mg by mouth at bedtime.     tacrolimus 0.5 MG capsule  Commonly known as:  PROGRAF  Take 1 capsule (0.5 mg total) by mouth daily. Take 1 capsule by mouth every morning along with one of the 30m capsules for a total dose of 1.563min the morning and take 1 capsule by mouth every  morning along with one of the 78m77mapsules for a total dose of 1.5mg52m night.     tacrolimus 1 MG capsule  Commonly known as:  PROGRAF  Take 1 capsule (1 mg total) by mouth 2 (two) times daily. Take 1 capsule (1 mg total) by mouth 2 (two) times daily.       VITALS: Blood pressure 146/60, pulse 89, temperature 97.9 F (36.6 C), temperature source Oral, resp. rate 23, height _0  (1.6 m), weight 163 lb (73.936 kg), SpO2 96 %.  PHYSICAL EXAM: General appearance: alert, no distress and significant coughing Neck: JVD - 5 cm above sternal notch and no carotid bruit Lungs: diminished breath sounds bilaterally, rhonchi bilaterally and wheezes expiratory Heart: regular rate and rhythm Abdomen: soft, non-tender; bowel sounds normal; no masses,  no organomegaly Extremities: extremities normal, atraumatic, no cyanosis or edema Pulses: 2+ and symmetric Skin: Skin color, texture, turgor normal. No rashes or lesions Neurologic: Grossly normal Psych: Pleasant  LABS: Results for orders placed or performed during the hospital encounter of 08/11/14 (from the past 48 hour(s))  CBC     Status: Abnormal   Collection Time: 08/11/14  1:05 PM  Result Value Ref Range   WBC 11.4 (H) 4.0 - 10.5 K/uL   RBC 4.10 3.87 - 5.11 MIL/uL   Hemoglobin 11.9 (L) 12.0 - 15.0 g/dL   HCT 36.5 36.0 - 46.0 %   MCV 89.0 78.0 - 100.0 fL   MCH 29.0 26.0 - 34.0 pg   MCHC 32.6 30.0 - 36.0 g/dL   RDW 13.5 11.5 - 15.5 %   Platelets 272 150 - 400 K/uL  Basic metabolic panel     Status: Abnormal   Collection Time: 08/11/14  1:05 PM  Result Value Ref Range   Sodium 135 135 - 145 mmol/L    Comment: Please note change in reference range.   Potassium 3.6 3.5 - 5.1 mmol/L    Comment: Please note change in reference range.   Chloride 101 96 - 112 mEq/L   CO2 22 19 - 32 mmol/L   Glucose, Bld 116 (H) 70 - 99 mg/dL   BUN 16 6 - 23 mg/dL   Creatinine, Ser 1.41 (H) 0.50 - 1.10 mg/dL   Calcium 10.5 8.4 - 10.5 mg/dL   GFR calc  non Af Amer 34 (L) >90 mL/min   GFR calc Af Amer 40 (L) >90 mL/min    Comment: (NOTE) The eGFR has been calculated using the CKD EPI  equation. This calculation has not been validated in all clinical situations. eGFR's persistently <90 mL/min signify possible Chronic Kidney Disease.    Anion gap 12 5 - 15  BNP (order ONLY if patient complains of dyspnea/SOB AND you have documented it for THIS visit)     Status: Abnormal   Collection Time: 08/11/14  1:05 PM  Result Value Ref Range   B Natriuretic Peptide 270.9 (H) 0.0 - 100.0 pg/mL    Comment: Please note change in reference range.  I-stat troponin, ED (not at [facility])     Status: Abnormal   Collection Time: 08/11/14  1:26 PM  Result Value Ref Range   Troponin i, poc 0.09 (HH) 0.00 - 0.08 ng/mL   Comment NOTIFIED PHYSICIAN    Comment 3            Comment: Due to the release kinetics of cTnI, a negative result within the first hours of the onset of symptoms does not rule out myocardial infarction with certainty. If myocardial infarction is still suspected, repeat the test at appropriate intervals.     IMAGING: Dg Chest 2 View  08/11/2014   CLINICAL DATA:  81 year old female with history of cough and shortness breath for the past 2 weeks.  EXAM: CHEST  2 VIEW  COMPARISON:  Chest x-ray 07/11/2013.  FINDINGS: Mild diffuse peribronchial cuffing. Ill-defined opacity in the lower left hemithorax, which appears to correspond to some fascial thickening on the lateral view in the inferior aspect of the left major fissure. No confluent consolidative airspace disease. No pleural effusions. Mild cephalization of the pulmonary vasculature, without frank pulmonary edema. Mild cardiomegaly. Upper mediastinal contours are within normal limits. Atherosclerosis in the thoracic aorta.  IMPRESSION: 1. Mild diffuse peribronchial cuffing, suggestive of acute bronchitis. 2. Unusual appearance in the lower left hemithorax, which appears correspond is some thickening  of the inferior aspect of the left major fissure. Repeat radiographs are recommended in 2-3 weeks after potential trial of antimicrobial therapy to ensure resolution of these findings. 3. Mild cardiomegaly with pulmonary venous congestion, but no frank pulmonary edema. 4. Atherosclerosis.   Electronically Signed   By: Vinnie Langton [name]   On: 08/11/2014 15:28    HOSPITAL DIAGNOSES: Principal Problem:   Acute on chronic congestive heart failure Active Problems:   S/P kidney transplant   CAD (coronary artery disease)   Elevated troponin   RECOMMENDATION: 1. Acute on chronic congestive heart failure-there is mild elevation in BNP to 270 without any pulmonary rales. I'm mostly appreciate rhonchi and expiratory wheezes. She does have distended neck veins, but make may not be significantly volume overloaded. I agree with diuresis. 2. Elevated troponin-troponin is elevated 0.09, possibly related to heart failure or this could represent acute coronary syndrome. I would recommend trending troponins and repeat a 2-D echocardiogram. 3. Leukocytosis-this is likely related to viral or bacterial bronchitis, antibiotics may be indicated per primary service  Thanks for the consultation -cardiology will follow.  Time Spent Directly with Patient: 30 minutes  Pixie Casino, MD, [facility] Attending Cardiologist [facility]  Briasia Flinders C 08/11/2014, 4:42 PM

## 2014-08-11 NOTE — ED Notes (Signed)
Admitting MD at bedside.

## 2014-08-11 NOTE — H&P (Signed)
Date: 08/11/2014               Patient Name:  [name] MRN: [id number]  DOB: 30-Jul-1932 Age / Sex: 81 y.o., female   PCP: Gara Kroner, MD         Medical Service: Internal Medicine Teaching Service         Attending Physician: Dr. Levert Feinstein, MD    First Contact: Dr. Senaida Ores Pager: [phone]  Second Contact: Dr. Yetta Barre Pager: [phone]       After Hours (After 5p/  First Contact Pager: [phone]  weekends / holidays): Second Contact Pager: [phone]   Chief Complaint: shortness of breath  History of Present Illness: [name] is an 81 yo female with PMHx of renal transplant, CAD, HTN, diastolic CHF, CKD, gout, candidal esophagitis who presents with a 2.5 week history of increasing shortness of breath and productive cough. Patient states she has become short of breath at rest and worsened with activity. She normally sleeps on 2 pillows at night, but has had to use 3 lately. She denies waking up in the middle of the night with shortness of breath. She denies weight gain or leg swelling. Patient has tried "everything" for her productive cough including mucinex and cough drops without relief. Sputum is white. Patient has sinus congestion and lacrimation. She denies fever, chills, nausea or vomiting. She has had occasional chest tightness about 2 times per week located substernal and to the left that does not radiate to her neck, back or left arm. Pain is sometimes associated with shortness of breath and cough, but not always. She rubs her chest which help the pain. These episodes do not last for long and are not associated with diaphoresis, nausea or vomiting. Patient has been taking all of her medications as prescribed, but did not take her morning doses today.   Meds:  (Not in a hospital admission)  Current Facility-Administered Medications  Medication Dose Route Frequency Provider Last Rate Last Dose  . [START ON 08/12/2014] furosemide (LASIX) injection 80 mg  80 mg Intravenous  BID Chrystie Nose, MD      . tacrolimus (PROGRAF) capsule 2.5 mg  2.5 mg Oral BID Courtney Paris, MD       Current Outpatient Prescriptions  Medication Sig Dispense Refill  . allopurinol (ZYLOPRIM) 100 MG tablet Take 100 mg by mouth at bedtime.    Marland Kitchen amLODipine (NORVASC) 10 MG tablet Take 5 mg by mouth Daily.     Marland Kitchen aspirin EC 81 MG tablet Take 81 mg by mouth daily.    . cinacalcet (SENSIPAR) 90 MG tablet Take 90 mg by mouth daily.    . colchicine 0.6 MG tablet Take 0.6 mg by mouth daily as needed. For gout    . famotidine (PEPCID) 20 MG tablet Take 20 mg by mouth 2 (two) times daily.    . furosemide (LASIX) 80 MG tablet Take 80 mg by mouth 2 (two) times daily.     Marland Kitchen losartan (COZAAR) 100 MG tablet Take 100 mg by mouth daily.    . metoprolol (LOPRESSOR) 50 MG tablet Take 100 mg by mouth 2 (two) times daily.    . mycophenolate (CELLCEPT) 250 MG capsule Take 500 mg by mouth 2 (two) times daily.    . pantoprazole (PROTONIX) 20 MG tablet Take 1 tablet (20 mg total) by mouth daily. 30 tablet 0  . senna-docusate (SENOKOT-S) 8.6-50 MG per tablet Take 1 tablet by mouth daily as needed for  mild constipation. 30 tablet 3  . simvastatin (ZOCOR) 20 MG tablet Take 20 mg by mouth at bedtime.     . tacrolimus (PROGRAF) 1 MG capsule Take 1 capsule (1 mg total) by mouth 2 (two) times daily. Take 1 capsule (1 mg total) by mouth 2 (two) times daily. (Patient taking differently: Take 1 mg by mouth 2 (two) times daily. Takes 0.5mg  along with  in morning and evening to equal total dose of 1.5mg  twice daily) 30 capsule 0  . acetaminophen (TYLENOL) 500 MG tablet Take 1 tablet (500 mg total) by mouth every 6 (six) hours as needed for mild pain. 30 tablet 0  . tacrolimus (PROGRAF) 0.5 MG capsule Take 1 capsule (0.5 mg total) by mouth daily. Take 1 capsule by mouth every morning along with one of the  capsules for a total dose of 1.5mg  in the morning and take 1 capsule by mouth every morning along with one of the   capsules for a total dose of 1.5mg  at night. (Patient taking differently: Take 0.5 mg by mouth daily. Takes 0.5mg  along with  in morning and evening to equal total dose of 1.5mg  twice daily) 30 capsule 0    Allergies: Allergies as of 08/11/2014 - Review Complete 08/11/2014  Allergen Reaction Noted  . Sulfa drugs cross reactors Swelling 25-Dec-2011   Past Medical History  Diagnosis Date  . S/P kidney transplant 12-24-2001    due to FSGN, follows with Dr. Briant Cedar  . Coronary artery disease   . Hypertension   . Cholelithiasis   . Diastolic CHF   . Gout     unconfirmed by joint aspiration  . Chronic kidney disease   . Candidal esophagitis   . Gastritis    Past Surgical History  Procedure Laterality Date  . Kidney transplant    . Abdominal hysterectomy    . Esophagogastroduodenoscopy N/A 03/20/2014    Procedure: ESOPHAGOGASTRODUODENOSCOPY (EGD);  Surgeon: Theda Belfast, MD;  Location: [facility] ENDOSCOPY;  Service: Endoscopy;  Laterality: N/A;   Family History  Problem Relation Age of Onset  . CAD Father   . CAD Brother    History   Social History  . Marital Status: Divorced    Spouse Name: N/A    Number of Children: N/A  . Years of Education: N/A   Occupational History  . Not on file.   Social History Main Topics  . Smoking status: Former Smoker    Quit date: 04/19/1969  . Smokeless tobacco: Never Used  . Alcohol Use: No  . Drug Use: No  . Sexual Activity: No   Other Topics Concern  . Not on file   Social History Narrative   Patient's husband died in Dec 24, 2008.    Review of Systems: General: Admits to fatigue and decreased appetite. Denies fever, chills, and diaphoresis.  Respiratory: Admits to SOB, productive cough, DOE, chest tightness, but denies wheezing.   Cardiovascular: Admits to chest tightness and denies palpitations.  Gastrointestinal: Denies nausea, vomiting, diarrhea, constipation, and abdominal distention.  Genitourinary: Denies dysuria, urgency,  frequency Skin: Denies pallor, rash and wounds.  Neurological: Denies dizziness, headaches, weakness, lightheadedness, numbness  Physical Exam: Filed Vitals:   08/11/14 1250 08/11/14 1257 08/11/14 1514 08/11/14 1515  BP:  145/55 135/56 146/60  Pulse:  95 87 89  Temp: 97.9 F (36.6 C)     TempSrc: Oral     Resp:  Height:  (1.6 m)     Weight: 73.936 kg (163  lb)     SpO2: 97%  98% 96%   General: Vital signs reviewed.  Patient is well-developed and well-nourished, in mild acute distress and cooperative with exam.  Neck: + JVD, no carotid bruit present.  Cardiovascular: RRR, S1 normal, S2 normal, no murmurs, gallops, or rubs. Pulmonary/Chest: Crackles in lower lung fields, no wheezes or rhonchi.  Abdominal: Soft, non-tender, non-distended, BS + Extremities: No lower extremity edema bilaterally Neurological: A&O x3 Skin: Warm, dry and intact. No rashes or erythema. Psychiatric: Normal mood and affect. speech and behavior is normal. Cognition and memory are normal.   Lab results: Basic Metabolic Panel:  Recent Labs  78/29/56 1305  NA 135  K 3.6  CL 101  CO2 22  GLUCOSE 116*  BUN 16  CREATININE 1.41*  CALCIUM 10.5   CBC:  Recent Labs  08/11/14 1305  WBC 11.4*  HGB 11.9*  HCT 36.5  MCV 89.0  PLT 272   Imaging results:  Dg Chest 2 View  08/11/2014   CLINICAL DATA:  81 year old female with history of cough and shortness breath for the past 2 weeks.  EXAM: CHEST  2 VIEW  COMPARISON:  Chest x-ray 07/11/2013.  FINDINGS: Mild diffuse peribronchial cuffing. Ill-defined opacity in the lower left hemithorax, which appears to correspond to some fascial thickening on the lateral view in the inferior aspect of the left major fissure. No confluent consolidative airspace disease. No pleural effusions. Mild cephalization of the pulmonary vasculature, without frank pulmonary edema. Mild cardiomegaly. Upper mediastinal contours are within normal limits. Atherosclerosis in  the thoracic aorta.  IMPRESSION: 1. Mild diffuse peribronchial cuffing, suggestive of acute bronchitis. 2. Unusual appearance in the lower left hemithorax, which appears correspond is some thickening of the inferior aspect of the left major fissure. Repeat radiographs are recommended in 2-3 weeks after potential trial of antimicrobial therapy to ensure resolution of these findings. 3. Mild cardiomegaly with pulmonary venous congestion, but no frank pulmonary edema. 4. Atherosclerosis.   Electronically Signed   By: Trudie Reed [name]   On: 08/11/2014 15:28   Other results: EKG: unchanged from previous tracings, sinus tachycardia, lateral ST depression and TWIs similar to prior.   Assessment & Plan by Problem: Principal Problem:   Acute on chronic congestive heart failure Active Problems:   S/P kidney transplant   CAD (coronary artery disease)   Elevated troponin  Acute on Chronic Grade 2 Diastolic Dysfunction: Last echo 07/2013 showed EF of 60-65% and grade 2 diastolic dysfunction. Patient presents with worsening shortness of breath with ambulation and at rest and has to sleep with more pillows at night. Vital signs on admission showed the patient was afebrile 97.9, tachycardic 95, RR 22, BP 145/55, 97% on room air. Physical exam is remarkable for JVD and crackles in lower lung fields. Labs showed mild leukocytosis of 11.4. BNP 270.9. CXR showed mild cardiomegaly with pulmonary venous congestion, but no frank pulmonary edema. Patient was seen by cardiology who agreed with diuresis and repeating echo. Patient received lasix 80 mg IV once in the ED. -Echocardiogram -Lasix 80 mg IV BID -Cardiology following, appreciate recommendations -CBC/BMET tomorrow am -Daily weights -I/Os -Cardiac diet -ASA 81 mg daily -Metoprolol 100 mg BID  Viral Bronchitis: Patient complains of productive cough and nasal congestion for 2 weeks. Patient is afebrile with mild leukocytosis. Etiology is most likely viral  in nature. CXR showed mild diffuse peribronchial cuffing, suggestive of acute bronchitis. Unusual appearance in the lower left hemithorax, which appears correspond is some thickening of  the inferior aspect of the left major fissure. Repeat radiographs are recommended in 2-3 weeks after potential trial of antimicrobial therapy to ensure resolution of these findings. We will give supportive treatment for now.  -Robitussin AC 5 mL Q6H prn   Atypical Chest Pain: Patient describes a substernal chest tightness that occurs 1-2 times a week for the last few weeks. Patient will rub chest to help pain go away. Pain is sometimes associated with cough and shortness of breath, but not always. Poc troponin elevated at 0.09. EKG shows sinus tachycardia, lateral ST depression and TWIs that are similar to prior. Cardiology questions if related to heart failure versus ACS and recommends trending troponins and repeat echo. -Echocardiogram -Cardiac Monitor -cardiology following, appreciate recommendations -ASA 81 mg daily -Metoprolol 100 mg BID -Trend troponins  -Repeat EKG  -Simvastatin 20 mg daily  HTN: BP 145/55 on admission. Patient is on amlodipine 5 mg daily, losartan 100 mg daily and metoprolol 100 mg BID at home.  -Amlodipine 5 mg daily -Metoprolol 100 mg BID  Acute on Chronic Kidney s/p Kidney Transplant: BUN/Cr 16/1.41 on admission. Creatinine August 2015 was 0.95. Patient is on Prograf 2.5 mg BID, Cellcept 500 mg BID, Lasix 80 mg BID and Sensipar 90 mg daily at home. Patient has been compliant with her medications.  -Sensipar 90 mg daily -Cellcept 500 mg BID -Prograf 2.5 mg BID -Obtain records from Nephrology -Repeat BMET tomorrow am  DVT/PE ppx: Lovenox 40 mg SQ daily  Dispo: Disposition is deferred at this time, awaiting improvement of current medical problems. Anticipated discharge in approximately 1-2 day(s).   The patient does have a current PCP Gara Kroner(Diana Truong, MD) and [facility].  The patient does have transportation limitations that hinder transportation to clinic appointments.  Signed: Jill AlexandersAlexa Richardson, DO PGY-1 Internal Medicine Resident Pager # [phone] 08/11/2014 6:52 PM

## 2014-08-11 NOTE — ED Provider Notes (Signed)
CSN: [id number]     Arrival date & time 08/11/14  1223 History   First MD Initiated Contact with Patient 08/11/14 1418     Chief Complaint  Patient presents with  . Cough     (Consider location/radiation/quality/duration/timing/severity/associated sxs/prior Treatment) HPI  [name] is a 81 y.o. female with PMH of diastolic heart failure and coronary artery disease, hypertension, CK D presenting with 2 weeks of cough and congestion with acute worsening last night. Patient states she is short of breath without chest pain. She states she cannot walk as far she could without becoming short of breath and having to stop. She has to sleep on 2 pillows. Patient with thick white sputum. She denies fevers but has had chills. She normally takes 80 mg Lasix twice daily but has not taken any today. She denies any weight gain. This also with gradual onset headache without slurred speech, weakness, visual changes.   Past Medical History  Diagnosis Date  . S/P kidney transplant 2003    due to FSGN, follows with Dr. Briant Cedar  . Coronary artery disease   . Hypertension   . Cholelithiasis   . Diastolic CHF   . Gout     unconfirmed by joint aspiration  . Chronic kidney disease   . Candidal esophagitis   . Gastritis   . Shortness of breath dyspnea    Past Surgical History  Procedure Laterality Date  . Kidney transplant    . Abdominal hysterectomy    . Esophagogastroduodenoscopy N/A 03/20/2014    Procedure: ESOPHAGOGASTRODUODENOSCOPY (EGD);  Surgeon: Theda Belfast, MD;  Location: [facility] ENDOSCOPY;  Service: Endoscopy;  Laterality: N/A;   Family History  Problem Relation Age of Onset  . CAD Father   . CAD Brother    History  Substance Use Topics  . Smoking status: Former Smoker    Quit date: 04/19/1969  . Smokeless tobacco: Never Used  . Alcohol Use: No   OB History    No data available     Review of Systems Ten systems reviewed and are negative for acute change, except as noted in the  HPI.     Allergies  Sulfa drugs cross reactors  Home Medications   Prior to Admission medications   Medication Sig Start Date End Date Taking? Authorizing Provider  allopurinol (ZYLOPRIM) 100 MG tablet Take 100 mg by mouth at bedtime.   Yes Historical Provider, MD  amLODipine (NORVASC) 10 MG tablet Take 5 mg by mouth Daily.  04/28/12  Yes Historical Provider, MD  aspirin EC 81 MG tablet Take 81 mg by mouth daily.   Yes Historical Provider, MD  cinacalcet (SENSIPAR) 90 MG tablet Take 90 mg by mouth daily.   Yes Historical Provider, MD  colchicine 0.6 MG tablet Take 0.6 mg by mouth daily as needed. For gout 04/26/12  Yes Almyra Deforest, MD  famotidine (PEPCID) 20 MG tablet Take 20 mg by mouth 2 (two) times daily.   Yes Historical Provider, MD  furosemide (LASIX) 80 MG tablet Take 80 mg by mouth 2 (two) times daily.    Yes Historical Provider, MD  losartan (COZAAR) 100 MG tablet Take 100 mg by mouth daily.   Yes Historical Provider, MD  metoprolol (LOPRESSOR) 50 MG tablet Take 100 mg by mouth 2 (two) times daily. 07/11/13  Yes Marrian Salvage, MD  mycophenolate (CELLCEPT) 250 MG capsule Take 500 mg by mouth 2 (two) times daily.   Yes Historical Provider, MD  pantoprazole (PROTONIX) 20 MG  tablet Take 1 tablet (20 mg total) by mouth daily. 03/21/14 08/11/14 Yes Alexa Senaida Ores, MD  senna-docusate (SENOKOT-S) 8.6-50 MG per tablet Take 1 tablet by mouth daily as needed for mild constipation. 03/21/14  Yes Alexa Senaida Ores, MD  simvastatin (ZOCOR) 20 MG tablet Take 20 mg by mouth at bedtime.    Yes Historical Provider, MD  tacrolimus (PROGRAF) 1 MG capsule Take 1 capsule (1 mg total) by mouth 2 (two) times daily. Take 1 capsule (1 mg total) by mouth 2 (two) times daily. Patient taking differently: Take 1 mg by mouth 2 (two) times daily. Takes 0.5mg  along with 1mg  in morning and evening to equal total dose of 1.5mg  twice daily 03/21/14  Yes Alexa Senaida Ores, MD  acetaminophen (TYLENOL) 500 MG tablet  Take 1 tablet (500 mg total) by mouth every 6 (six) hours as needed for mild pain. 03/21/14   Jill Alexanders, MD  tacrolimus (PROGRAF) 0.5 MG capsule Take 1 capsule (0.5 mg total) by mouth daily. Take 1 capsule by mouth every morning along with one of the 1mg  capsules for a total dose of 1.5mg  in the morning and take 1 capsule by mouth every morning along with one of the 1mg  capsules for a total dose of 1.5mg  at night. Patient taking differently: Take 0.5 mg by mouth daily. Takes 0.5mg  along with 1mg  in morning and evening to equal total dose of 1.5mg  twice daily 03/21/14 04/01/14  Alexa Senaida Ores, MD   BP 126/48 mmHg  Pulse 68  Temp(Src) 98.5 F (36.9 C) (Oral)  Resp 16  Ht 5\' 3"  (1.6 m)  Wt 154 lb 12.8 oz (70.217 kg)  BMI 27.43 kg/m2  SpO2 100% Physical Exam  Constitutional: She appears well-developed and well-nourished. No distress.  HENT:  Head: Normocephalic and atraumatic.  Eyes: Conjunctivae and EOM are normal. Right eye exhibits no discharge. Left eye exhibits no discharge.  Neck: JVD present.  Cardiovascular: Normal rate and regular rhythm.   No leg swelling or tenderness.   Pulmonary/Chest: Effort normal. No respiratory distress.  Bibasilar crackles and rhonchi  Abdominal: Soft. Bowel sounds are normal. She exhibits no distension. There is no tenderness.  Neurological: She is alert. She exhibits normal muscle tone. Coordination normal.  Skin: Skin is warm and dry. She is not diaphoretic.  Nursing note and vitals reviewed.   ED Course  Procedures (including critical care time) Labs Review Labs Reviewed  CBC - Abnormal; Notable for the following:    WBC 11.4 (*)    Hemoglobin 11.9 (*)    All other components within normal limits  BASIC METABOLIC PANEL - Abnormal; Notable for the following:    Glucose, Bld 116 (*)    Creatinine, Ser 1.41 (*)    GFR calc non Af Amer 34 (*)    GFR calc Af Amer 40 (*)    All other components within normal limits  BRAIN NATRIURETIC  PEPTIDE - Abnormal; Notable for the following:    B Natriuretic Peptide 270.9 (*)    All other components within normal limits  TROPONIN I - Abnormal; Notable for the following:    Troponin I 0.11 (*)    All other components within normal limits  TROPONIN I - Abnormal; Notable for the following:    Troponin I 0.11 (*)    All other components within normal limits  BASIC METABOLIC PANEL - Abnormal; Notable for the following:    Sodium 134 (*)    Potassium 3.4 (*)    Glucose, Bld 123 (*)  Creatinine, Ser 1.43 (*)    GFR calc non Af Amer 34 (*)    GFR calc Af Amer 39 (*)    All other components within normal limits  CBC - Abnormal; Notable for the following:    WBC 10.6 (*)    RBC 3.64 (*)    Hemoglobin 10.9 (*)    HCT 32.5 (*)    All other components within normal limits  I-STAT TROPOININ, ED - Abnormal; Notable for the following:    Troponin i, poc 0.09 (*)    All other components within normal limits  INFLUENZA PANEL BY PCR (TYPE A & B, H1N1)  TACROLIMUS LEVEL    Imaging Review X-ray Chest Pa And Lateral  08/12/2014   CLINICAL DATA:  Cough and congestion, headache for 2 weeks. Shortness of breath. History of CHF.  EXAM: CHEST  2 VIEW  COMPARISON:  Chest radiograph August 11, 2014 at 1449 hr  FINDINGS: The cardiac silhouette is not appears mildly enlarged. Mildly calcified aortic knob. Similar bandlike density in LEFT lung base. Mild bronchitic changes without pleural effusions or focal consolidations. No pneumothorax. Surgical clips in LEFT axilla. Osteopenia.  IMPRESSION: Mild cardiomegaly. Similar bronchitic changes without focal consolidation.  Similar LEFT fissure thickening. Previous recommendation for follow-up in 2-3 weeks stands.   Electronically Signed   By: Awilda Metro   On: 08/12/2014 00:45   Dg Chest 2 View  08/11/2014   CLINICAL DATA:  81 year old female with history of cough and shortness breath for the past 2 weeks.  EXAM: CHEST  2 VIEW  COMPARISON:  Chest  x-ray 07/11/2013.  FINDINGS: Mild diffuse peribronchial cuffing. Ill-defined opacity in the lower left hemithorax, which appears to correspond to some fascial thickening on the lateral view in the inferior aspect of the left major fissure. No confluent consolidative airspace disease. No pleural effusions. Mild cephalization of the pulmonary vasculature, without frank pulmonary edema. Mild cardiomegaly. Upper mediastinal contours are within normal limits. Atherosclerosis in the thoracic aorta.  IMPRESSION: 1. Mild diffuse peribronchial cuffing, suggestive of acute bronchitis. 2. Unusual appearance in the lower left hemithorax, which appears correspond is some thickening of the inferior aspect of the left major fissure. Repeat radiographs are recommended in 2-3 weeks after potential trial of antimicrobial therapy to ensure resolution of these findings. 3. Mild cardiomegaly with pulmonary venous congestion, but no frank pulmonary edema. 4. Atherosclerosis.   Electronically Signed   By: Trudie Reed [name]   On: 08/11/2014 15:28     EKG Interpretation   Date/Time:  Sunday August 11 2014 12:50:32 EST Ventricular Rate:  97 PR Interval:  194 QRS Duration: 100 QT Interval:  382 QTC Calculation: 485 R Axis:   -26 Text Interpretation:  Sinus rhythm with Possible Premature atrial  complexes with Abberant conduction Biatrial enlargement Septal infarct ,  age undetermined ST \\T \ T wave abnormality, consider lateral ischemia ,  increased since last tracing Abnormal ECG Confirmed by KNAPP  MD-J, JON  ([id number]) on 08/11/2014 2:47:29 PM      MDM   Final diagnoses:  Shortness of breath  Acute on chronic congestive heart failure, unspecified congestive heart failure type   Patient presenting with worsening dyspnea on exertion and cough for 2 weeks with acute worsening last night. She has not taken her Lasix today. No chest pain.VSS.  Pt with pulmonary edema on exam and JVD. Troponin is elevated to 0.09. EKG  with ST depression. CXR non specific with findings. BNP 270. Pt with acute on chronic  CHF exacerbation vs possible ACS. Patient given IV Lasix in ED. Consult to internal for admission and consult to cardiology for evaluation.   Discussed all results and patient verbalizes understanding and agrees with plan.  This is a shared patient. This patient was discussed with the physician, Dr. Lynelle DoctorKnapp who saw and evaluated the patient and agrees with the plan.     Louann SjogrenVictoria L Farhiya Rosten, PA-C 08/12/14 1636  Linwood DibblesJon Knapp, MD 08/14/14 [id number]

## 2014-08-12 ENCOUNTER — Encounter (HOSPITAL_COMMUNITY): Payer: Self-pay | Admitting: General Practice

## 2014-08-12 ENCOUNTER — Inpatient Hospital Stay (HOSPITAL_COMMUNITY): Payer: Medicare Other

## 2014-08-12 DIAGNOSIS — N189 Chronic kidney disease, unspecified: Secondary | ICD-10-CM

## 2014-08-12 DIAGNOSIS — R0789 Other chest pain: Secondary | ICD-10-CM

## 2014-08-12 DIAGNOSIS — I130 Hypertensive heart and chronic kidney disease with heart failure and stage 1 through stage 4 chronic kidney disease, or unspecified chronic kidney disease: Secondary | ICD-10-CM

## 2014-08-12 DIAGNOSIS — D899 Disorder involving the immune mechanism, unspecified: Secondary | ICD-10-CM

## 2014-08-12 DIAGNOSIS — J209 Acute bronchitis, unspecified: Secondary | ICD-10-CM

## 2014-08-12 DIAGNOSIS — Z940 Kidney transplant status: Secondary | ICD-10-CM

## 2014-08-12 DIAGNOSIS — Z7982 Long term (current) use of aspirin: Secondary | ICD-10-CM

## 2014-08-12 DIAGNOSIS — N179 Acute kidney failure, unspecified: Secondary | ICD-10-CM

## 2014-08-12 DIAGNOSIS — I5033 Acute on chronic diastolic (congestive) heart failure: Principal | ICD-10-CM

## 2014-08-12 DIAGNOSIS — I248 Other forms of acute ischemic heart disease: Secondary | ICD-10-CM

## 2014-08-12 LAB — BASIC METABOLIC PANEL
Anion gap: 11 (ref 5–15)
BUN: 18 mg/dL (ref 6–23)
CO2: 23 mmol/L (ref 19–32)
Calcium: 9.9 mg/dL (ref 8.4–10.5)
Chloride: 100 mEq/L (ref 96–112)
Creatinine, Ser: 1.43 mg/dL — ABNORMAL HIGH (ref 0.50–1.10)
GFR calc non Af Amer: 34 mL/min — ABNORMAL LOW (ref >90)
GFR, EST AFRICAN AMERICAN: 39 mL/min — AB (ref >90)
GLUCOSE: 123 mg/dL — AB (ref 70–99)
Potassium: 3.4 mmol/L — ABNORMAL LOW (ref 3.5–5.1)
SODIUM: 134 mmol/L — AB (ref 135–145)

## 2014-08-12 LAB — INFLUENZA PANEL BY PCR (TYPE A & B)
H1N1 flu by pcr: NOT DETECTED
INFLBPCR: NEGATIVE
Influenza A By PCR: NEGATIVE

## 2014-08-12 LAB — TROPONIN I
Troponin I: 0.11 ng/mL — ABNORMAL HIGH (ref <0.031)
Troponin I: 0.11 ng/mL — ABNORMAL HIGH (ref <0.031)

## 2014-08-12 LAB — CBC
HCT: 32.5 % — ABNORMAL LOW (ref 36.0–46.0)
Hemoglobin: 10.9 g/dL — ABNORMAL LOW (ref 12.0–15.0)
MCH: 29.9 pg (ref 26.0–34.0)
MCHC: 33.5 g/dL (ref 30.0–36.0)
MCV: 89.3 fL (ref 78.0–100.0)
Platelets: 256 10*3/uL (ref 150–400)
RBC: 3.64 MIL/uL — AB (ref 3.87–5.11)
RDW: 13.5 % (ref 11.5–15.5)
WBC: 10.6 10*3/uL — AB (ref 4.0–10.5)

## 2014-08-12 MED ORDER — FUROSEMIDE 80 MG PO TABS: 80.0000 mg | ORAL_TABLET | Freq: Two times a day (BID) | ORAL | Status: DC

## 2014-08-12 MED ORDER — OSELTAMIVIR PHOSPHATE 30 MG PO CAPS
30.0000 mg | ORAL_CAPSULE | Freq: Two times a day (BID) | ORAL | Status: DC
  Filled 2014-08-12 (×2): qty 1

## 2014-08-12 MED ORDER — FUROSEMIDE 80 MG PO TABS
80.0000 mg | ORAL_TABLET | Freq: Two times a day (BID) | ORAL | Status: DC
  Administered 2014-08-13: 80 mg via ORAL
  Filled 2014-08-12 (×4): qty 1

## 2014-08-12 MED ORDER — ONDANSETRON HCL 4 MG/2ML IJ SOLN
4.0000 mg | Freq: Three times a day (TID) | INTRAMUSCULAR | Status: DC | PRN
  Administered 2014-08-12 – 2014-08-15 (×4): 4 mg via INTRAVENOUS
  Filled 2014-08-12 (×4): qty 2

## 2014-08-12 MED ORDER — DEXTROSE 5 % IV SOLN
500.0000 mg | INTRAVENOUS | Status: DC
  Administered 2014-08-12 – 2014-08-13 (×2): 500 mg via INTRAVENOUS
  Filled 2014-08-12 (×2): qty 500

## 2014-08-12 MED ORDER — TACROLIMUS 1 MG PO CAPS
2.5000 mg | ORAL_CAPSULE | Freq: Two times a day (BID) | ORAL | Status: DC
  Filled 2014-08-12: qty 1

## 2014-08-12 MED ORDER — TACROLIMUS 0.5 MG PO CAPS
1.5000 mg | ORAL_CAPSULE | Freq: Two times a day (BID) | ORAL | Status: DC
Start: 2014-08-12 — End: 2014-08-13
  Administered 2014-08-12 – 2014-08-13 (×2): 1.5 mg via ORAL
  Filled 2014-08-12 (×3): qty 1

## 2014-08-12 MED ORDER — INFLUENZA VAC SPLIT QUAD 0.5 ML IM SUSY
0.5000 mL | PREFILLED_SYRINGE | INTRAMUSCULAR | Status: AC
  Administered 2014-08-13: 0.5 mL via INTRAMUSCULAR
  Filled 2014-08-12: qty 0.5

## 2014-08-12 NOTE — Progress Notes (Signed)
Subjective:  Patient was seen and examined this morning. Patient continues to have a productive cough and shortness of breath above baseline. She also admits to nausea. Patient denies fever, chills, chest pain or abdominal pain.   Objective: Vital signs in last 24 hours: Filed Vitals:   08/12/14 0500 08/12/14 0600 08/12/14 0800 08/12/14 1002  BP: 117/47 127/41 119/60 129/56  Pulse: 69 71 89 81  Temp:   98.7 F (37.1 C)   TempSrc:   Oral   Resp: Height:    (1.6 m)   Weight:   70.217 kg (154 lb 12.8 oz)   SpO2: 93% 97% 99%    Weight change:   Intake/Output Summary (Last 24 hours) at 08/12/14 1412 Last data filed at 08/12/14 1128  Gross per 24 hour  Intake      3 ml  Output    500 ml  Net   -497 ml   General: Vital signs reviewed. Patient is well-developed and well-nourished, in no acute distress and cooperative with exam.  Neck: + JVD, no carotid bruit present.  Cardiovascular: RRR, S1 normal, S2 normal, no murmurs, gallops, or rubs. Pulmonary/Chest: Lungs clear to auscultation bilaterally.  Abdominal: Soft, non-tender, non-distended, BS + Extremities: No lower extremity edema bilaterally Neurological: A&O x3 Skin: Warm, dry and intact. No rashes or erythema. Psychiatric: Normal mood and affect. speech and behavior is normal. Cognition and memory are normal.   Lab Results: Basic Metabolic Panel:  Recent Labs Lab 08/11/14 1305 08/12/14 0130  NA 135 134*  K 3.6 3.4*  CL 101 100  CO2 22 23  GLUCOSE 116* 123*  BUN 16 18  CREATININE 1.41* 1.43*  CALCIUM 10.5 9.9   CBC:  Recent Labs Lab 08/11/14 1305 08/12/14 0130  WBC 11.4* 10.6*  HGB 11.9* 10.9*  HCT 36.5 32.5*  MCV 89.0 89.3  PLT 272 256   Cardiac Enzymes:  Recent Labs Lab 08/11/14 2335 08/12/14 0130  TROPONINI 0.11* 0.11*   Studies/Results: X-ray Chest Pa And Lateral  08/12/2014   CLINICAL DATA:  Cough and congestion, headache for 2 weeks. Shortness of breath. History of CHF.   EXAM: CHEST  2 VIEW  COMPARISON:  Chest radiograph August 11, 2014 at 1449 hr  FINDINGS: The cardiac silhouette is not appears mildly enlarged. Mildly calcified aortic knob. Similar bandlike density in LEFT lung base. Mild bronchitic changes without pleural effusions or focal consolidations. No pneumothorax. Surgical clips in LEFT axilla. Osteopenia.  IMPRESSION: Mild cardiomegaly. Similar bronchitic changes without focal consolidation.  Similar LEFT fissure thickening. Previous recommendation for follow-up in 2-3 weeks stands.   Electronically Signed   By: Awilda Metro   On: 08/12/2014 00:45   Dg Chest 2 View  08/11/2014   CLINICAL DATA:  81 year old female with history of cough and shortness breath for the past 2 weeks.  EXAM: CHEST  2 VIEW  COMPARISON:  Chest x-ray 07/11/2013.  FINDINGS: Mild diffuse peribronchial cuffing. Ill-defined opacity in the lower left hemithorax, which appears to correspond to some fascial thickening on the lateral view in the inferior aspect of the left major fissure. No confluent consolidative airspace disease. No pleural effusions. Mild cephalization of the pulmonary vasculature, without frank pulmonary edema. Mild cardiomegaly. Upper mediastinal contours are within normal limits. Atherosclerosis in the thoracic aorta.  IMPRESSION: 1. Mild diffuse peribronchial cuffing, suggestive of acute bronchitis. 2. Unusual appearance in the lower left hemithorax, which appears correspond is some thickening of the inferior aspect of  the left major fissure. Repeat radiographs are recommended in 2-3 weeks after potential trial of antimicrobial therapy to ensure resolution of these findings. 3. Mild cardiomegaly with pulmonary venous congestion, but no frank pulmonary edema. 4. Atherosclerosis.   Electronically Signed   By: Trudie Reedaniel  Entrikin [name]   On: 08/11/2014 15:28   Medications: I have reviewed the patient's current medications. Scheduled Meds: . allopurinol  100 mg Oral QHS  .  amLODipine  5 mg Oral Daily  . aspirin EC  81 mg Oral Daily  . azithromycin  500 mg Intravenous Q24H  . cinacalcet  90 mg Oral Q breakfast  . enoxaparin (LOVENOX) injection  40 mg Subcutaneous Daily  . [START ON 08/13/2014] furosemide  80 mg Oral BID  . [START ON 08/13/2014] Influenza vac split quadrivalent PF  0.5 mL Intramuscular Tomorrow-1000  . metoprolol  100 mg Oral BID  . mycophenolate  500 mg Oral BID  . pantoprazole  20 mg Oral Daily  . simvastatin  20 mg Oral QHS  . sodium chloride  3 mL Intravenous Q12H  . tacrolimus  2.5 mg Oral BID   Continuous Infusions:  PRN Meds:.guaiFENesin-codeine, ondansetron (ZOFRAN) IV, senna-docusate Assessment/Plan: Principal Problem:   Acute on chronic congestive heart failure Active Problems:   S/P kidney transplant   CAD (coronary artery disease)   Elevated troponin   Diastolic CHF, acute on chronic  Acute on Chronic Grade 2 Diastolic CHF: Last echo 07/2013 showed EF of 60-65% and grade 2 diastolic dysfunction. Patient does not appear volume overloaded on physical exam. Lungs exam improved, no crackles on examination. Patient has JVD, but lower extremity edema bilaterally. May be a discrepancy in scales given weight decreased from 73.936>70.217 kg overnight. Total urine output is 500 cc. Cardiology recommends stopping IV Lasix and resuming home dose tomorrow morning.  -Echocardiogram pending -Cardiology following, appreciate recommendations -CBC/BMET tomorrow am -Daily weights -I/Os -Cardiac diet -ASA 81 mg daily -Metoprolol 100 mg BID -Resume home lasix 80 mg po BID starting tomorrow am  Bacterial versus Viral Bronchitis: CXR showed mild diffuse peribronchial cuffing, suggestive of acute bronchitis. Patient continues to have a productive cough, nausea and shortness of breath above baseline. WBC 11.4>10.6. Flu negative. -Robitussin AC 5 mL Q6H prn  -Azithromycin 500 mg IV QD -Zofran 4 mg IV Q8H prn   Atypical Chest Pain: Patient  continues to have a mild pain that she will rub to help pain go away. Possibly secondary to cough. Troponins 0.11>0.11. EKG shows repolarization abnormalities and no ST elevations or depressions.  -Echocardiogram pending -Cardiac Monitor -Cardiology following, appreciate recommendations -ASA 81 mg daily -Metoprolol 100 mg BID -Simvastatin 20 mg daily  HTN: BP well controlled. Patient is on amlodipine 5 mg daily, losartan 100 mg daily and metoprolol 100 mg BID at home.  -Amlodipine 5 mg daily -Metoprolol 100 mg BID  Acute on Chronic Kidney s/p Kidney Transplant: BUN/Cr 16/1.41 > 18/1.43. Creatinine August 2015 was 0.95. Patient is on Prograf 2.5 mg BID, Cellcept 500 mg BID, Lasix 80 mg BID and Sensipar 90 mg daily at home. Patient has been compliant with her medications. We are obtaining records from pharmacy and [facility] to verify doses.  -Sensipar 90 mg daily -Cellcept 500 mg BID -Prograf 2.5 mg BID -Tacrolimus level -Repeat BMET tomorrow am  DVT/PE ppx: Lovenox 40 mg SQ daily  Dispo: Disposition is deferred at this time, awaiting improvement of current medical problems.  Anticipated discharge in approximately 1-2 day(s).   The patient does have a  current PCP Gara Kroner, MD) and does need an [facility] hospital follow-up appointment after discharge.  The patient does not have transportation limitations that hinder transportation to clinic appointments.  .Services Needed at time of discharge: Y = Yes, Blank = No PT:   OT:   RN:   Equipment:   Other:     LOS: 1 day   Jill Alexanders, DO PGY-1 Internal Medicine Resident Pager # [phone] 08/12/2014 2:12 PM

## 2014-08-12 NOTE — Progress Notes (Addendum)
Subjective: Nauseated after taking meds, feels poor today.   Objective: Vital signs in last 24 hours: Temp:  [98 F (36.7 C)-98.9 F (37.2 C)] 98.7 F (37.1 C) (01/11 0800) Pulse Rate:  [60-89] 81 (01/11 1002) Resp:  [13-27] 23 (01/11 0800) BP: (115-148)/(41-62) 129/56 mmHg (01/11 1002) SpO2:  [91 %-99 %] 99 % (01/11 0800) Weight:  [154 lb 12.8 oz (70.217 kg)] 154 lb 12.8 oz (70.217 kg) (01/11 0800)    Intake/Output from previous day: 01/10 0701 - 01/11 0700 In: 3 [I.V.:3] Out: -  Intake/Output this shift: Total I/O In: 0  Out: 500 [Urine:500]  Medications Current Facility-Administered Medications  Medication Dose Route Frequency Provider Last Rate Last Dose  . allopurinol (ZYLOPRIM) tablet 100 mg  100 mg Oral QHS Courtney ParisEden W Jones, MD   100 mg at 08/12/14 0135  . amLODipine (NORVASC) tablet 5 mg  5 mg Oral Daily Courtney ParisEden W Jones, MD   5 mg at 08/12/14 1002  . aspirin EC tablet 81 mg  81 mg Oral Daily Courtney ParisEden W Jones, MD   81 mg at 08/12/14 1001  . azithromycin (ZITHROMAX) 500 mg in dextrose 5 % 250 mL IVPB  500 mg Intravenous Q24H Courtney ParisEden W Jones, MD   500 mg at 08/12/14 1115  . cinacalcet (SENSIPAR) tablet 90 mg  90 mg Oral Q breakfast Courtney ParisEden W Jones, MD   90 mg at 08/12/14 1156  . enoxaparin (LOVENOX) injection 40 mg  40 mg Subcutaneous Daily Courtney ParisEden W Jones, MD   40 mg at 08/12/14 1157  . furosemide (LASIX) injection 80 mg  80 mg Intravenous BID Chrystie NoseKenneth C. Hilty, MD   80 mg at 08/12/14 1002  . guaiFENesin-codeine 100-10 MG/5ML solution 5 mL  5 mL Oral Q6H PRN Courtney ParisEden W Jones, MD   5 mL at 08/12/14 0135  . metoprolol tartrate (LOPRESSOR) tablet 100 mg  100 mg Oral BID Courtney ParisEden W Jones, MD   100 mg at 08/12/14 1002  . mycophenolate (CELLCEPT) capsule 500 mg  500 mg Oral BID Courtney ParisEden W Jones, MD   500 mg at 08/12/14 1156  . pantoprazole (PROTONIX) EC tablet 20 mg  20 mg Oral Daily Courtney ParisEden W Jones, MD   20 mg at 08/12/14 1156  . senna-docusate (Senokot-S) tablet 1 tablet  1 tablet Oral Daily PRN Courtney ParisEden W  Jones, MD      . simvastatin (ZOCOR) tablet 20 mg  20 mg Oral QHS Courtney ParisEden W Jones, MD   20 mg at 08/12/14 0046  . sodium chloride 0.9 % injection 3 mL  3 mL Intravenous Q12H Courtney ParisEden W Jones, MD   3 mL at 08/12/14 1010  . tacrolimus (PROGRAF) capsule 1.5 mg  1.5 mg Oral BID Levert FeinsteinJames M Granfortuna, MD   1.5 mg at 08/12/14 1156    PE: General appearance: alert, cooperative and no distress Lungs: mild B wheeze, rhonchi with cough Heart: regular rate and rhythm, S1, S2 normal, no murmur, click, rub or gallop Extremities: No LEE Pulses: 2+ and symmetric Skin: Warm and dry Neurologic: Grossly normal  Lab Results:   Recent Labs  08/11/14 1305 08/12/14 0130  WBC 11.4* 10.6*  HGB 11.9* 10.9*  HCT 36.5 32.5*  PLT 272 256   BMET  Recent Labs  08/11/14 1305 08/12/14 0130  NA 135 134*  K 3.6 3.4*  CL 101 100  CO2 22 23  GLUCOSE 116* 123*  BUN 16 18  CREATININE 1.41* 1.43*  CALCIUM 10.5 9.9   Cardiac Panel (  last 3 results)  Recent Labs  08/11/14 2335 08/12/14 0130  TROPONINI 0.11* 0.11*     Assessment/Plan 81 y.o. female with a past medical history significant for CAD s/p distant RCA PCI and FSGN s/p renal tranplant as well as diastolic CHF now in hospital with acute on chronic diastolic CHF    Acute on chronic diastolic congestive heart failure Net fluids:   -0.5L?   She does not appear volume overloaded at this time.  Stop IV lasix and change to home dose to start tomorrow.  Will review pending echo.  There is quite a discrepancy in yesterday's and today weight.  Recheck in AM.     S/P kidney transplant  SCr stable   CAD (coronary artery disease)   Elevated troponin  Last two were 0.11.  Demand ischemia in setting of CHF   Mild CP   2/10 could be angina or related to bronchitis. Sometimes worse with cough.       Hypokalemia  Replaced.  Monitor    LOS: 1 day    HAGER, BRYAN PA-C 08/12/2014 1:06 PM  Personally seen and examined. Agree with above. Changes made to note  above.  Agree that she appears fairly well compensated.  Started PO lasix.  ECHO pending. Prior in December had normal EF with elevated LA filling pressure.  BNP not that elevated. Symptoms seem to point to bronchitis. No further testing at this time.  Donato Schultz, MD

## 2014-08-12 NOTE — Progress Notes (Signed)
Pt admitted to the unit at 0839. Pt mental status is A&OX4. Pt oriented to room, staff, and call bell. Skin is intact. Full assessment charted in CH[facility]. Call bell within reach. Visitor guidelines reviewed w/ pt and/or family.

## 2014-08-12 NOTE — ED Notes (Signed)
EKG done and description said Acute MI. Paged IMTS to inform them of EKG findings.

## 2014-08-13 ENCOUNTER — Inpatient Hospital Stay (HOSPITAL_COMMUNITY): Payer: Medicare Other

## 2014-08-13 DIAGNOSIS — I5033 Acute on chronic diastolic (congestive) heart failure: Secondary | ICD-10-CM | POA: Diagnosis not present

## 2014-08-13 DIAGNOSIS — N289 Disorder of kidney and ureter, unspecified: Secondary | ICD-10-CM

## 2014-08-13 DIAGNOSIS — I369 Nonrheumatic tricuspid valve disorder, unspecified: Secondary | ICD-10-CM

## 2014-08-13 DIAGNOSIS — N179 Acute kidney failure, unspecified: Secondary | ICD-10-CM | POA: Insufficient documentation

## 2014-08-13 DIAGNOSIS — N189 Chronic kidney disease, unspecified: Secondary | ICD-10-CM | POA: Diagnosis not present

## 2014-08-13 LAB — BASIC METABOLIC PANEL
Anion gap: 8 (ref 5–15)
BUN: 25 mg/dL — AB (ref 6–23)
CHLORIDE: 98 meq/L (ref 96–112)
CO2: 25 mmol/L (ref 19–32)
Calcium: 8.7 mg/dL (ref 8.4–10.5)
Creatinine, Ser: 2.16 mg/dL — ABNORMAL HIGH (ref 0.50–1.10)
GFR calc Af Amer: 24 mL/min — ABNORMAL LOW (ref >90)
GFR calc non Af Amer: 20 mL/min — ABNORMAL LOW (ref >90)
GLUCOSE: 96 mg/dL (ref 70–99)
Potassium: 3.5 mmol/L (ref 3.5–5.1)
Sodium: 131 mmol/L — ABNORMAL LOW (ref 135–145)

## 2014-08-13 LAB — CBC
HCT: 34.5 % — ABNORMAL LOW (ref 36.0–46.0)
Hemoglobin: 11.5 g/dL — ABNORMAL LOW (ref 12.0–15.0)
MCH: 30.3 pg (ref 26.0–34.0)
MCHC: 33.3 g/dL (ref 30.0–36.0)
MCV: 91 fL (ref 78.0–100.0)
PLATELETS: 310 10*3/uL (ref 150–400)
RBC: 3.79 MIL/uL — AB (ref 3.87–5.11)
RDW: 13.9 % (ref 11.5–15.5)
WBC: 10.1 10*3/uL (ref 4.0–10.5)

## 2014-08-13 MED ORDER — SODIUM CHLORIDE 0.9 % IV SOLN
INTRAVENOUS | Status: DC
  Administered 2014-08-13: 14:00:00 via INTRAVENOUS

## 2014-08-13 MED ORDER — ENOXAPARIN SODIUM 30 MG/0.3ML ~~LOC~~ SOLN
30.0000 mg | SUBCUTANEOUS | Status: DC
  Administered 2014-08-14 – 2014-08-16 (×3): 30 mg via SUBCUTANEOUS
  Filled 2014-08-13 (×4): qty 0.3

## 2014-08-13 MED ORDER — TACROLIMUS 1 MG PO CAPS
1.5000 mg | ORAL_CAPSULE | Freq: Two times a day (BID) | ORAL | Status: DC
  Administered 2014-08-14 – 2014-08-17 (×7): 1.5 mg via ORAL
  Filled 2014-08-13 (×8): qty 1

## 2014-08-13 MED ORDER — AZITHROMYCIN 250 MG PO TABS: 250.0000 mg | ORAL_TABLET | Freq: Every day | ORAL | Status: DC

## 2014-08-13 MED ORDER — SODIUM CHLORIDE 0.9 % IV SOLN
INTRAVENOUS | Status: DC
  Administered 2014-08-13: 16:00:00 via INTRAVENOUS

## 2014-08-13 MED ORDER — ENOXAPARIN SODIUM 40 MG/0.4ML ~~LOC~~ SOLN: 40.0000 mg | SUBCUTANEOUS | Status: DC

## 2014-08-13 MED ORDER — SODIUM CHLORIDE 0.9 % IV SOLN
INTRAVENOUS | Status: DC
Start: 2014-08-13 — End: 2014-08-13

## 2014-08-13 NOTE — Progress Notes (Signed)
Subjective:  Main complaint is cough  Objective:  Vital Signs in the last 24 hours: Temp:  [97.5 F (36.4 C)-98.9 F (37.2 C)] 98.9 F (37.2 C) (01/12 1301) Pulse Rate:  [59-72] 59 (01/12 0923) Resp:  [16-24] 24 (01/12 1301) BP: (104-126)/(48-54) 117/51 mmHg (01/12 0923) SpO2:  [90 %-100 %] 90 % (01/12 1301) Weight:  [152 lb 8 oz (69.174 kg)] 152 lb 8 oz (69.174 kg) (01/12 0500)  Intake/Output from previous day:  Intake/Output Summary (Last 24 hours) at 08/13/14 1331 Last data filed at 08/13/14 0900  Gross per 24 hour  Intake    730 ml  Output      0 ml  Net    730 ml    Physical Exam: General appearance: alert, cooperative and no distress Neck: JVP appears elevated Lungs: clear to auscultation bilaterally Heart: regular rate and rhythm   Rate: 60  Rhythm: normal sinus rhythm by EKG 08/11/14  Lab Results:  Recent Labs  08/12/14 0130 08/13/14 0657  WBC 10.6* 10.1  HGB 10.9* 11.5*  PLT 256 310    Recent Labs  08/12/14 0130 08/13/14 0657  NA 134* 131*  K 3.4* 3.5  CL 100 98  CO2 23 25  GLUCOSE 123* 96  BUN 18 25*  CREATININE 1.43* 2.16*    Recent Labs  08/11/14 2335 08/12/14 0130  TROPONINI 0.11* 0.11*   No results for input(s): INR in the last 72 hours.  Imaging: Imaging results have been reviewed  Cardiac Studies: Echo 08/13/14 Study Conclusions  - Left ventricle: The cavity size was normal. There was moderate concentric hypertrophy. Systolic function was normal. The estimated ejection fraction was in the range of 60% to 65%. Wall motion was normal; there were no regional wall motion abnormalities. Doppler parameters are consistent with abnormal left ventricular relaxation (grade 1 diastolic dysfunction). Doppler parameters are consistent with elevated ventricular end-diastolic filling pressure. - Aortic valve: There was no regurgitation. - Aortic root: The aortic root was normal in size. - Mitral valve: Structurally  normal valve. Transvalvular velocity was within the normal range. There was no evidence for stenosis. - Left atrium: The atrium was at the upper limits of normal in size. - Right atrium: The atrium was normal in size. - Tricuspid valve: There was mild regurgitation. - Pulmonic valve: There was no regurgitation. - Pulmonary arteries: Systolic pressure was within the normal range. - Inferior vena cava: The vessel was normal in size. - Pericardium, extracardiac: A trivial pericardial effusion was identified. Features were not consistent with tamponade physiology.  Impressions:  - Normal biventricular size and systolic function. Moderate concentric LVH with impaired relaxaxtion and elevated filling pressures. Mild tricuspid regurgitation, normal RVSP. Scheduled Meds: . allopurinol  100 mg Oral QHS  . amLODipine  5 mg Oral Daily  . aspirin EC  81 mg Oral Daily  . [START ON 08/14/2014] azithromycin  250 mg Oral Daily  . cinacalcet  90 mg Oral Q breakfast  . enoxaparin (LOVENOX) injection  40 mg Subcutaneous Daily  . metoprolol  100 mg Oral BID  . mycophenolate  500 mg Oral BID  . pantoprazole  20 mg Oral Daily  . simvastatin  20 mg Oral QHS  . sodium chloride  3 mL Intravenous Q12H  . tacrolimus  1.5 mg Oral BID   Continuous Infusions: . sodium chloride 100 mL/hr at 08/13/14 1353   PRN Meds:.guaiFENesin-codeine, ondansetron (ZOFRAN) IV, senna-docusate   Assessment/Plan:    81 y.o. female with a past  medical history significant for CAD s/p distant RCA PCI and FSGN s/p renal transplant, (followed by Dr Briant Cedar) as well as diastolic CHF. Pt admitted with cough and weakness with suspected acute on chronic diastolic CHF. I/O not kept and daily wgts are suspect. Her SCr has risen from 0.85 in Aug 2015 to 2.16 today.   Principal Problem:   Acute bronchitis Active Problems:   Acute on chronic renal insufficiency   S/P kidney transplant   CAD S/P remote RCA PCI    Elevated troponin   Diastolic CHF, acute on chronic   Hypertension   PLAN: She needs renal consult. Hold Lasix  Corine Shelter PA-C Beeper [phone] 08/13/2014, 1:31 PM  Personally seen and examined. Agree with above. Stopping lasix ECHO reassuring, normal EF, mild DD. Symptoms pointing to bronchitis/ AKI Resume lasix when renal team ok.  AKI likely from diuresis.  Will sign off.   Donato Schultz, MD

## 2014-08-13 NOTE — Progress Notes (Addendum)
Subjective:  Patient was seen and examined this morning. Patient continues to have productive cough and weakness. Shortness of breath improved, no fever or chills.   Objective: Vital signs in last 24 hours: Filed Vitals:   08/12/14 2302 08/13/14 0500 08/13/14 0607 08/13/14 0923  BP: 120/52  104/54 117/51  Pulse: 72  65 59  Temp: 98.2 F (36.8 C)  97.5 F (36.4 C)   TempSrc: Oral  Oral   Resp: 24  18   Height:      Weight:  69.174 kg (152 lb 8 oz)    SpO2: 99%  91%    Weight change: -3.719 kg (-8 lb 3.2 oz)  Intake/Output Summary (Last 24 hours) at 08/13/14 1155 Last data filed at 08/13/14 0900  Gross per 24 hour  Intake    730 ml  Output      0 ml  Net    730 ml   General: Vital signs reviewed. Patient is well-developed and well-nourished, in no acute distress and cooperative with exam.  Cardiovascular: RRR, S1 normal, S2 normal, no murmurs, gallops, or rubs. Pulmonary/Chest: Lungs clear to auscultation bilaterally.  Abdominal: Soft, non-tender, non-distended, BS + Extremities: No lower extremity edema bilaterally Neurological: A&O x3 Skin: Warm, dry and intact. No rashes or erythema. Psychiatric: Normal mood and affect. speech and behavior is normal. Cognition and memory are normal.   Lab Results: Basic Metabolic Panel:  Recent Labs Lab 08/12/14 0130 08/13/14 0657  NA 134* 131*  K 3.4* 3.5  CL 100 98  CO2 23 25  GLUCOSE 123* 96  BUN 18 25*  CREATININE 1.43* 2.16*  CALCIUM 9.9 8.7   CBC:  Recent Labs Lab 08/12/14 0130 08/13/14 0657  WBC 10.6* 10.1  HGB 10.9* 11.5*  HCT 32.5* 34.5*  MCV 89.3 91.0  PLT 256 310   Cardiac Enzymes:  Recent Labs Lab 08/11/14 2335 08/12/14 0130  TROPONINI 0.11* 0.11*   Studies/Results: X-ray Chest Pa And Lateral  08/12/2014   CLINICAL DATA:  Cough and congestion, headache for 2 weeks. Shortness of breath. History of CHF.  EXAM: CHEST  2 VIEW  COMPARISON:  Chest radiograph August 11, 2014 at 1449 hr  FINDINGS:  The cardiac silhouette is not appears mildly enlarged. Mildly calcified aortic knob. Similar bandlike density in LEFT lung base. Mild bronchitic changes without pleural effusions or focal consolidations. No pneumothorax. Surgical clips in LEFT axilla. Osteopenia.  IMPRESSION: Mild cardiomegaly. Similar bronchitic changes without focal consolidation.  Similar LEFT fissure thickening. Previous recommendation for follow-up in 2-3 weeks stands.   Electronically Signed   By: Awilda Metro   On: 08/12/2014 00:45   Dg Chest 2 View  08/11/2014   CLINICAL DATA:  81 year old female with history of cough and shortness breath for the past 2 weeks.  EXAM: CHEST  2 VIEW  COMPARISON:  Chest x-ray 07/11/2013.  FINDINGS: Mild diffuse peribronchial cuffing. Ill-defined opacity in the lower left hemithorax, which appears to correspond to some fascial thickening on the lateral view in the inferior aspect of the left major fissure. No confluent consolidative airspace disease. No pleural effusions. Mild cephalization of the pulmonary vasculature, without frank pulmonary edema. Mild cardiomegaly. Upper mediastinal contours are within normal limits. Atherosclerosis in the thoracic aorta.  IMPRESSION: 1. Mild diffuse peribronchial cuffing, suggestive of acute bronchitis. 2. Unusual appearance in the lower left hemithorax, which appears correspond is some thickening of the inferior aspect of the left major fissure. Repeat radiographs are recommended in 2-3 weeks after potential  trial of antimicrobial therapy to ensure resolution of these findings. 3. Mild cardiomegaly with pulmonary venous congestion, but no frank pulmonary edema. 4. Atherosclerosis.   Electronically Signed   By: Trudie Reedaniel  Entrikin [name]   On: 08/11/2014 15:28   Medications: I have reviewed the patient's current medications. Scheduled Meds: . allopurinol  100 mg Oral QHS  . amLODipine  5 mg Oral Daily  . aspirin EC  81 mg Oral Daily  . [START ON 08/14/2014]  azithromycin  250 mg Oral Daily  . cinacalcet  90 mg Oral Q breakfast  . enoxaparin (LOVENOX) injection  40 mg Subcutaneous Daily  . furosemide  80 mg Oral BID  . metoprolol  100 mg Oral BID  . mycophenolate  500 mg Oral BID  . pantoprazole  20 mg Oral Daily  . simvastatin  20 mg Oral QHS  . sodium chloride  3 mL Intravenous Q12H  . tacrolimus  1.5 mg Oral BID   Continuous Infusions:  PRN Meds:.guaiFENesin-codeine, ondansetron (ZOFRAN) IV, senna-docusate Assessment/Plan: Principal Problem:   Acute bronchitis Active Problems:   S/P kidney transplant   CAD (coronary artery disease)   Acute on chronic congestive heart failure   Elevated troponin   Diastolic CHF, acute on chronic  Acute on Chronic Grade 2 Diastolic CHF: Resolved. Last echo 07/2013 showed EF of 60-65% and grade 2 diastolic dysfunction. Weight 73.936>70.217>69.174 kg overnight. Back on home lasix, not volume overloaded. Lungs clear on exam. Echo showed EF 60-65% and grade 1 diastolic dysfunction, normal biventricular size and systolic function, moderate concentric LVH with impaired relaxation and elevated filling pressures, and mild tricuspid regurgitation, normal RVSP. Hold lasix in setting of AKI. -Cardiology following, appreciate recommendations -BMET tomorrow am -Daily weights -I/Os -Cardiac diet -ASA 81 mg daily -Metoprolol 100 mg BID -Hold lasix 80 mg po BID due to AKI  Acute Bronchitis: CXR showed mild diffuse peribronchial cuffing, suggestive of acute bronchitis. Patient continues to have a productive cough, nausea and shortness of breath above baseline. WBC 11.4>10.6>10.1. Discontinued Azithromycin due to AKI. -Robitussin AC 5 mL Q6H prn  -Discontinued Azithromycin 250 mg due to AKI -Zofran 4 mg IV Q8H prn   Atypical Chest Pain: Resolved.  -Cardiology following, appreciate recommendations -ASA 81 mg daily -Metoprolol 100 mg BID -Simvastatin 20 mg daily  HTN: BP well controlled. Patient is on amlodipine  5 mg daily, losartan 100 mg daily and metoprolol 100 mg BID at home.  -Amlodipine 5 mg daily -Metoprolol 100 mg BID  Acute on Chronic Kidney s/p Kidney Transplant: BUN/Cr 16/1.41 > 18/1.43>25/2.16. Creatinine August 2015 was 0.95. Patient is on Prograf 1.5 mg BID, Cellcept 500 mg BID, Lasix 80 mg BID and Sensipar 90 mg daily at home. AKI likely due to interaction with Azithromycin. This was discussed with Dr. Hyman HopesWebb who recommended stopping azithromycin, holding dose of prograf tonight, renal ultrasound and checking UA and repeating labs in the morning. We will also hold lasix. I will touch base with Dr. Hyman HopesWebb tomorrow if formal consult is needed.  -UA -Renal US -Discontinue Azithromycin -Hold prograf tonight, restart tomorrow -NS 50 cc/hr for 10 hours -Measure I/Os, patient seems to oliguric -Sensipar 90 mg daily -Cellcept 500 mg BID -Prograf 1.5 mg BID (restart tomorrow) -Tacrolimus level -Repeat BMET tomorrow am  DVT/PE ppx: Lovenox 40 mg SQ daily  Dispo: Anticipated discharge to home approximately tomorrow.  The patient does have a current PCP Gara Kroner(Diana Truong, MD) and [facility] discharge.  The patient does  not have transportation limitations that hinder transportation to clinic appointments.  .Services Needed at time of discharge: Y = Yes, Blank = No PT:   OT:   RN:   Equipment:   Other:     LOS: 2 days   Jill Alexanders, DO PGY-1 Internal Medicine Resident Pager # [phone] 08/13/2014 11:55 AM

## 2014-08-13 NOTE — Progress Notes (Signed)
  Echocardiogram 2D Echocardiogram has been performed.  Delcie RochENNINGTON, Ameri Cahoon 08/13/2014, 9:08 AM

## 2014-08-13 NOTE — Progress Notes (Signed)
Asked patient if I could get a new IV started for her since [facility] keeps [facility] and patient refused new IV. Educated her to keep arm straight to prevent it from beeping as much. Will continue to monitor.

## 2014-08-13 NOTE — Progress Notes (Signed)
Patient ID: [name], female   DOB: 09/11/1931, 81 y.o.   MRN: [id number] Medicine attending: I personally examined this patient today together with resident physician Dr. Jill AlexandersAlexa Richardson and I concur with her evaluation and management plan. We appreciate cardiology input. Echocardiogram shows normal biventricular size and systolic function, moderate concentric LVH consistent with grade 1 diastolic dysfunction. At this point she has been over diuresed with rise in her creatinine. We will hold Lasix and resume at outpatient dose when otherwise stable. Bronchitis slowly responding to antibiotics. Lungs remain clear.

## 2014-08-14 LAB — URINALYSIS, ROUTINE W REFLEX MICROSCOPIC
Bilirubin Urine: NEGATIVE
Glucose, UA: NEGATIVE mg/dL
Hgb urine dipstick: NEGATIVE
Ketones, ur: NEGATIVE mg/dL
NITRITE: NEGATIVE
Protein, ur: NEGATIVE mg/dL
Specific Gravity, Urine: 1.014 (ref 1.005–1.030)
Urobilinogen, UA: 1 mg/dL (ref 0.0–1.0)
pH: 5 (ref 5.0–8.0)

## 2014-08-14 LAB — BASIC METABOLIC PANEL
ANION GAP: 8 (ref 5–15)
BUN: 30 mg/dL — ABNORMAL HIGH (ref 6–23)
CHLORIDE: 97 meq/L (ref 96–112)
CO2: 26 mmol/L (ref 19–32)
Calcium: 8.8 mg/dL (ref 8.4–10.5)
Creatinine, Ser: 2.24 mg/dL — ABNORMAL HIGH (ref 0.50–1.10)
GFR calc non Af Amer: 20 mL/min — ABNORMAL LOW (ref >90)
GFR, EST AFRICAN AMERICAN: 23 mL/min — AB (ref >90)
Glucose, Bld: 106 mg/dL — ABNORMAL HIGH (ref 70–99)
POTASSIUM: 3.9 mmol/L (ref 3.5–5.1)
SODIUM: 131 mmol/L — AB (ref 135–145)

## 2014-08-14 LAB — TACROLIMUS LEVEL: TACROLIMUS (FK506) - LABCORP: 12.4 ng/mL (ref 2.0–20.0)

## 2014-08-14 LAB — URINE MICROSCOPIC-ADD ON

## 2014-08-14 MED ORDER — HYDROCOD POLST-CHLORPHEN POLST 10-8 MG/5ML PO LQCR
5.0000 mL | Freq: Two times a day (BID) | ORAL | Status: DC | PRN
  Administered 2014-08-14 – 2014-08-15 (×3): 5 mL via ORAL
  Filled 2014-08-14 (×3): qty 5

## 2014-08-14 MED ORDER — DM-GUAIFENESIN ER 30-600 MG PO TB12
1.0000 | ORAL_TABLET | Freq: Two times a day (BID) | ORAL | Status: DC
  Administered 2014-08-14 – 2014-08-17 (×7): 1 via ORAL
  Filled 2014-08-14 (×8): qty 1

## 2014-08-14 NOTE — Progress Notes (Signed)
Subjective:  Patient was seen and examined this morning. Patient continues to have a persistent non-productive cough. She states her shortness of breath is unchanged from yesterday. She admits to not eating and drinking much due to feeling unwell. After explaining the importance of this, she agrees to drink more water. She denies any fever, chills or chest pain.   Objective: Vital signs in last 24 hours: Filed Vitals:   08/14/14 0617 08/14/14 0630 08/14/14 0742 08/14/14 0815  BP: 102/32 115/50    Pulse:      Temp:      TempSrc:      Resp:      Height:      Weight:   70.806 kg (156 lb 1.6 oz)   SpO2:    93%   Weight change:   Intake/Output Summary (Last 24 hours) at 08/14/14 1108 Last data filed at 08/14/14 0900  Gross per 24 hour  Intake 1131.17 ml  Output    300 ml  Net 831.17 ml   General: Vital signs reviewed. Patient is well-developed and well-nourished, in no acute distress and cooperative with exam.  Cardiovascular: RRR, S1 normal, S2 normal, no murmurs, gallops, or rubs. Pulmonary/Chest: Mild inspiratory wheezes, no crackles or rhonchi.  Abdominal: Soft, non-tender, non-distended, BS + Extremities: No lower extremity edema bilaterally Neurological: A&O x3 Skin: Warm, dry and intact. No rashes or erythema. Psychiatric: Normal mood and affect. speech and behavior is normal. Cognition and memory are normal.   Lab Results: Basic Metabolic Panel:  Recent Labs Lab 08/13/14 0657 08/14/14 0512  NA 131* 131*  K 3.5 3.9  CL 98 97  CO2 25 26  GLUCOSE 96 106*  BUN 25* 30*  CREATININE 2.16* 2.24*  CALCIUM 8.7 8.8   CBC:  Recent Labs Lab 08/12/14 0130 08/13/14 0657  WBC 10.6* 10.1  HGB 10.9* 11.5*  HCT 32.5* 34.5*  MCV 89.3 91.0  PLT 256 310   Cardiac Enzymes:  Recent Labs Lab 08/11/14 2335 08/12/14 0130  TROPONINI 0.11* 0.11*   Studies/Results: [place] Renal Transplant W/doppler  08/13/2014   CLINICAL DATA:  Renal transplant with acute kidney injury.   EXAM: ULTRASOUND OF RENAL TRANSPLANT WITH DOPPLER  TECHNIQUE: Ultrasound examination of the renal transplant was performed with gray-scale, color and duplex doppler evaluation.  COMPARISON:  CT 03/13/2014 and ultrasound 08/11/2004  FINDINGS: Transplant kidney location:  The right lower quadrant.  Transplant kidney:  Length: 11.9 cm. Normal in size and parenchymal echogenicity. No evidence of mass or hydronephrosis.  Color flow in the main renal artery:  Present  Color flow in the main renal vein:  Present  Duplex Doppler Evaluation  Resistive index in main renal artery: 0.67  Venous waveform in main renal vein:  Present  Resistive index in upper pole intrarenal artery: 0.64  Resistive index in lower pole intrarenal artery: 0.66  Bladder: Small amount of fluid in the urinary bladder but, reportedly, the patient recently voided.  Other findings: Native right kidney is atrophic with a round hypoechoic cyst. Native left kidney is not well visualized.  IMPRESSION: Normal appearance of the transplant kidney.   Electronically Signed   By: Richarda OverlieAdam  Henn [name]   On: 08/13/2014 18:12   Medications: I have reviewed the patient's current medications. Scheduled Meds: . allopurinol  100 mg Oral QHS  . amLODipine  5 mg Oral Daily  . aspirin EC  81 mg Oral Daily  . cinacalcet  90 mg Oral Q breakfast  . dextromethorphan-guaiFENesin  1 tablet  Oral BID  . enoxaparin (LOVENOX) injection  30 mg Subcutaneous Q24H  . metoprolol  100 mg Oral BID  . mycophenolate  500 mg Oral BID  . pantoprazole  20 mg Oral Daily  . simvastatin  20 mg Oral QHS  . sodium chloride  3 mL Intravenous Q12H  . tacrolimus  1.5 mg Oral BID   Continuous Infusions:  PRN Meds:.chlorpheniramine-HYDROcodone, ondansetron (ZOFRAN) IV, senna-docusate Assessment/Plan: Principal Problem:   Acute bronchitis Active Problems:   S/P kidney transplant   CAD S/P remote RCA PCI   Hypertension   Elevated troponin   Diastolic CHF, acute on chronic   Acute on  chronic renal insufficiency   Congestive heart disease   AKI (acute kidney injury)  Acute on Chronic Kidney s/p Kidney Transplant: Likely 2/2 azithromycin and over-diuresis with poor po intake. BUN/Cr 16/1.41 > 18/1.43>25/2.16>30/2.24. Creatinine August 2015 was 0.95. Patient is on Prograf 1.5 mg BID, Cellcept 500 mg BID, Lasix 80 mg BID and Sensipar 90 mg daily at home. We stopped azithromycin, held one dose of prograf last night, stopped lasix, gave NS 50 cc/hr for 10 hours. Renal ultrasound showed normal appearance of transplant kidney. Tacrolimus level normal 12.4. UA was unremarkable. This case was discussed with Dr. Hyman Hopes, Nephrology, who has kindly agreed to see our patient.  -Measure I/Os, patient seems to oliguric -Sensipar 90 mg daily -Cellcept 500 mg BID -Prograf 1.5 mg BID  -Tacrolimus level -Repeat renal function panel tomorrow am -Nephrology following, appreciate recommendations  Acute on Chronic Grade 2 Diastolic CHF: Resolved. Echo showed EF 60-65% and grade 1 diastolic dysfunction, normal biventricular size and systolic function, moderate concentric LVH with impaired relaxation and elevated filling pressures, and mild tricuspid regurgitation, normal RVSP. Holding lasix in setting of AKI. Patient is not orthostatic.  -Cardiology following, appreciate recommendations -BMET tomorrow am -Daily weights -I/Os -Cardiac diet -ASA 81 mg daily -Metoprolol 100 mg BID -Hold lasix 80 mg po BID due to AKI  Acute Bronchitis: CXR showed mild diffuse peribronchial cuffing, suggestive of acute bronchitis. Discontinued Azithromycin due to AKI. Patient continues to have intense coughing spells. Currently on 2 L O2 via [place].  -Tussionex BID prn -Mucinex DM BID  -Discontinued Azithromycin 250 mg daily due to AKI -Zofran 4 mg IV Q8H prn   HTN: BP well controlled. Patient is on amlodipine 5 mg daily, losartan 100 mg daily and metoprolol 100 mg BID at home.  -Amlodipine 5 mg daily -Metoprolol  100 mg BID  DVT/PE ppx: Lovenox 40 mg SQ daily  Dispo: Anticipated discharge to home approximately tomorrow.  The patient does have a current PCP Gara Kroner, MD) and does need an [facility] hospital follow-up appointment after discharge.  The patient does not have transportation limitations that hinder transportation to clinic appointments.  .Services Needed at time of discharge: Y = Yes, Blank = No PT:   OT:   RN:   Equipment:   Other:     LOS: 3 days   Jill Alexanders, DO PGY-1 Internal Medicine Resident Pager # [phone] 08/14/2014 11:08 AM

## 2014-08-14 NOTE — Consult Note (Signed)
Referring Provider: No ref. provider found Primary Care Physician:  Gara Kroner, MD Primary Nephrologist:  Dr. Merrily Brittle  Reason for Consultation:  Acute renal failure s/p renal transplant admitted with CHF exacerbation   HPI: 81 y.o. female with a past medical history significant for CAD s/p distant RCA PCI and FSGN s/p renal tranplant as well as diastolic CHF . Baseline creatinine of 1.2 no episodes of rejections of infections. Stable on current dose of prograf. Prograf levels appeared generous 1/13 and had been receiving azithromycin  daily as well as lasix since admission. There has been some slight decreases noted in the blood pressure since admission but not less than . Renal U/S was unremarkable. There has been good urine output. Creatinine on admission was 1.4   Past Medical History  Diagnosis Date  . S/P kidney transplant 2003    due to FSGN, follows with Dr. Briant Cedar  . Coronary artery disease   . Hypertension   . Cholelithiasis   . Diastolic CHF   . Gout     unconfirmed by joint aspiration  . Chronic kidney disease   . Candidal esophagitis   . Gastritis   . Shortness of breath dyspnea     Past Surgical History  Procedure Laterality Date  . Kidney transplant    . Abdominal hysterectomy    . Esophagogastroduodenoscopy N/A 03/20/2014    Procedure: ESOPHAGOGASTRODUODENOSCOPY (EGD);  Surgeon: Theda Belfast, MD;  Location: [facility] ENDOSCOPY;  Service: Endoscopy;  Laterality: N/A;    Prior to Admission medications   Medication Sig Start Date End Date Taking? Authorizing Provider  allopurinol (ZYLOPRIM) 100 MG tablet Take 100 mg by mouth at bedtime.   Yes Historical Provider, MD  amLODipine (NORVASC) 10 MG tablet Take 5 mg by mouth Daily.  04/28/12  Yes Historical Provider, MD  aspirin EC 81 MG tablet Take 81 mg by mouth daily.   Yes Historical Provider, MD  cinacalcet (SENSIPAR) 90 MG tablet Take 90 mg by mouth daily.   Yes Historical Provider, MD  colchicine 0.6  MG tablet Take 0.6 mg by mouth daily as needed. For gout 04/26/12  Yes Almyra Deforest, MD  famotidine (PEPCID) 20 MG tablet Take 20 mg by mouth 2 (two) times daily.   Yes Historical Provider, MD  furosemide (LASIX) 80 MG tablet Take 80 mg by mouth 2 (two) times daily.    Yes Historical Provider, MD  losartan (COZAAR) 100 MG tablet Take 100 mg by mouth daily.   Yes Historical Provider, MD  metoprolol (LOPRESSOR) 50 MG tablet Take 100 mg by mouth 2 (two) times daily. 07/11/13  Yes Marrian Salvage, MD  mycophenolate (CELLCEPT) 250 MG capsule Take 500 mg by mouth 2 (two) times daily.   Yes Historical Provider, MD  pantoprazole (PROTONIX) 20 MG tablet Take 1 tablet (20 mg total) by mouth daily. 03/21/14 08/11/14 Yes Alexa Senaida Ores, MD  senna-docusate (SENOKOT-S) 8.6-50 MG per tablet Take 1 tablet by mouth daily as needed for mild constipation. 03/21/14  Yes Alexa Senaida Ores, MD  simvastatin (ZOCOR) 20 MG tablet Take 20 mg by mouth at bedtime.    Yes Historical Provider, MD  tacrolimus (PROGRAF) 1 MG capsule Take 1 capsule (1 mg total) by mouth 2 (two) times daily. Take 1 capsule (1 mg total) by mouth 2 (two) times daily. Patient taking differently: Take 1 mg by mouth 2 (two) times daily. Takes 0.5mg  along with  in morning and evening to equal total dose of 1.5mg  twice daily 03/21/14  Yes Alexa  Senaida Oresichardson, MD  acetaminophen (TYLENOL) 500 MG tablet Take 1 tablet (500 mg total) by mouth every 6 (six) hours as needed for mild pain. 03/21/14   Jill AlexandersAlexa Richardson, MD  tacrolimus (PROGRAF) 0.5 MG capsule Take 1 capsule (0.5 mg total) by mouth daily. Take 1 capsule by mouth every morning along with one of the 1mg  capsules for a total dose of 1.5mg  in the morning and take 1 capsule by mouth every morning along with one of the 1mg  capsules for a total dose of 1.5mg  at night. Patient taking differently: Take 0.5 mg by mouth daily. Takes 0.5mg  along with 1mg  in morning and evening to equal total dose of 1.5mg  twice daily  03/21/14 04/01/14  Jill AlexandersAlexa Richardson, MD    Current Facility-Administered Medications  Medication Dose Route Frequency Provider Last Rate Last Dose  . allopurinol (ZYLOPRIM) tablet 100 mg  100 mg Oral QHS Courtney ParisEden W Jones, MD   100 mg at 08/13/14 2313  . amLODipine (NORVASC) tablet 5 mg  5 mg Oral Daily Courtney ParisEden W Jones, MD   5 mg at 08/14/14 1046  . aspirin EC tablet 81 mg  81 mg Oral Daily Courtney ParisEden W Jones, MD   81 mg at 08/14/14 1046  . chlorpheniramine-HYDROcodone (TUSSIONEX) 10-8 MG/5ML suspension 5 mL  5 mL Oral Q12H PRN Jill AlexandersAlexa Richardson, MD   5 mL at 08/14/14 1238  . cinacalcet (SENSIPAR) tablet 90 mg  90 mg Oral Q breakfast Courtney ParisEden W Jones, MD   90 mg at 08/14/14 0800  . dextromethorphan-guaiFENesin (MUCINEX DM) 30-600 MG per 12 hr tablet 1 tablet  1 tablet Oral BID Jill AlexandersAlexa Richardson, MD   1 tablet at 08/14/14 1247  . enoxaparin (LOVENOX) injection 30 mg  30 mg Subcutaneous Q24H Herby AbrahamMichelle T Bell, RPH   30 mg at 08/14/14 1048  . metoprolol tartrate (LOPRESSOR) tablet 100 mg  100 mg Oral BID Courtney ParisEden W Jones, MD   100 mg at 08/14/14 1047  . mycophenolate (CELLCEPT) capsule 500 mg  500 mg Oral BID Courtney ParisEden W Jones, MD   500 mg at 08/14/14 1047  . ondansetron (ZOFRAN) injection 4 mg  4 mg Intravenous Q8H PRN Jill AlexandersAlexa Richardson, MD   4 mg at 08/14/14 1238  . pantoprazole (PROTONIX) EC tablet 20 mg  20 mg Oral Daily Courtney ParisEden W Jones, MD   20 mg at 08/14/14 1046  . senna-docusate (Senokot-S) tablet 1 tablet  1 tablet Oral Daily PRN Courtney ParisEden W Jones, MD      . simvastatin (ZOCOR) tablet 20 mg  20 mg Oral QHS Courtney ParisEden W Jones, MD   20 mg at 08/13/14 2313  . sodium chloride 0.9 % injection 3 mL  3 mL Intravenous Q12H Courtney ParisEden W Jones, MD   3 mL at 08/14/14 1051  . tacrolimus (PROGRAF) capsule 1.5 mg  1.5 mg Oral BID Jill AlexandersAlexa Richardson, MD   1.5 mg at 08/14/14 1047    Allergies as of 08/11/2014 - Review Complete 08/11/2014  Allergen Reaction Noted  . Sulfa drugs cross reactors Swelling 12/03/2011    Family History  Problem Relation Age of  Onset  . CAD Father   . CAD Brother     History   Social History  . Marital Status: Divorced    Spouse Name: N/A    Number of Children: N/A  . Years of Education: N/A   Occupational History  . Not on file.   Social History Main Topics  . Smoking status: Former Smoker    Quit date: 04/19/1969  .  Smokeless tobacco: Never Used  . Alcohol Use: No  . Drug Use: No  . Sexual Activity: No   Other Topics Concern  . Not on file   Social History Narrative   Patient's husband died in 12-06-08.    Review of Systems: Gen: Denies any fever, chills, sweats, anorexia, fatigue, weakness, malaise, weight loss, and sleep disorder HEENT: No visual complaints, No history of Retinopathy. Normal external appearance No Epistaxis or Sore throat. No sinusitis.   CV: Denies chest pain, angina, palpitations, syncope, orthopnea, PND, peripheral edema, and claudication. Resp: Denies dyspnea at rest, dyspnea with exercise, cough, sputum, wheezing, coughing up blood, and pleurisy. GI: Denies vomiting blood, jaundice, and fecal incontinence.   Denies dysphagia or odynophagia. GU : Denies urinary burning, blood in urine, urinary frequency, urinary hesitancy, nocturnal urination, and urinary incontinence.  No renal calculi. MS: Denies joint pain, limitation of movement, and swelling, stiffness, low back pain, extremity pain. Denies muscle weakness, cramps, atrophy.  No use of non steroidal antiinflammatory drugs. Derm: Denies rash, itching, dry skin, hives, moles, warts, or unhealing ulcers.  Psych: Denies depression, anxiety, memory loss, suicidal ideation, hallucinations, paranoia, and confusion. Heme: Denies bruising, bleeding, and enlarged lymph nodes. Neuro: No headache.  No diplopia. No dysarthria.  No dysphasia.  No history of CVA.  No Seizures. No paresthesias.  No weakness. Endocrine No DM.  No Thyroid disease.  No Adrenal disease.  Physical Exam: Vital signs in last 24 hours: Temp:  [98.5 F (36.9  C)-99.3 F (37.4 C)] 99.3 F (37.4 C) (01/13 0614) Pulse Rate:  [63-68] 68 (01/13 0614) Resp:  [16-20] 20 (01/13 0614) BP: (102-121)/(32-50) 115/50 mmHg (01/13 0630) SpO2:  [92 %-94 %] 93 % (01/13 0815) Weight:  [70.806 kg (156 lb 1.6 oz)] 70.806 kg (156 lb 1.6 oz) (01/13 0742) Last BM Date: 08/12/14 General:   Alert,  Well-developed, well-nourished, pleasant and cooperative in NAD Head:  Normocephalic and atraumatic. Eyes:  Sclera clear, no icterus.   Conjunctiva pink. Ears:  Normal auditory acuity. Nose:  No deformity, discharge,  or lesions. Mouth:  No deformity or lesions, dentition normal. Neck:  Supple; no masses or thyromegaly. JVP not elevated Lungs:  Clear throughout to auscultation.   No wheezes, crackles, or rhonchi. No acute distress. Heart:  Regular rate and rhythm; no murmurs, clicks, rubs,  or gallops. Abdomen:  Soft, nontender and nondistended. No masses, hepatosplenomegaly or hernias noted. Normal bowel sounds, without guarding, and without rebound.   Msk:  Symmetrical without gross deformities. Normal posture. Pulses:  No carotid, renal, femoral bruits. DP and PT symmetrical and equal Extremities:  Without clubbing or edema. Neurologic:  Alert and  oriented x4;  grossly normal neurologically. Skin:  Intact without significant lesions or rashes. Cervical Nodes:  No significant cervical adenopathy. Psych:  Alert and cooperative. Normal mood and affect.  Intake/Output from previous day: 01/12 0701 - 01/13 0700 In: 1191.2 [P.O.:462; I.V.:729.2] Out: 300 [Urine:300] Intake/Output this shift: Total I/O In: 60 [P.O.:60] Out: -   Lab Results:  Recent Labs  08/12/14 0130 08/13/14 0657  WBC 10.6* 10.1  HGB 10.9* 11.5*  HCT 32.5* 34.5*  PLT 256 310   BMET  Recent Labs  08/12/14 0130 08/13/14 0657 08/14/14 0512  NA 134* 131* 131*  K 3.4* 3.5 3.9  CL 100 98 97  CO2 GLUCOSE 123* 96 106*  BUN 18 25* 30*  CREATININE 1.43* 2.16* 2.24*  CALCIUM  9.9 8.7 8.8   LFT No results  for input(s): PROT, ALBUMIN, AST, ALT, ALKPHOS, BILITOT, BILIDIR, IBILI in the last 72 hours. PT/INR No results for input(s): LABPROT, INR in the last 72 hours. Hepatitis Panel No results for input(s): HEPBSAG, HCVAB, HEPAIGM, HEPBIGM in the last 72 hours.  Studies/Results: [place] Renal Transplant W/doppler  08/13/2014   CLINICAL DATA:  Renal transplant with acute kidney injury.  EXAM: ULTRASOUND OF RENAL TRANSPLANT WITH DOPPLER  TECHNIQUE: Ultrasound examination of the renal transplant was performed with gray-scale, color and duplex doppler evaluation.  COMPARISON:  CT 03/13/2014 and ultrasound 08/11/2004  FINDINGS: Transplant kidney location:  The right lower quadrant.  Transplant kidney:  Length: 11.9 cm. Normal in size and parenchymal echogenicity. No evidence of mass or hydronephrosis.  Color flow in the main renal artery:  Present  Color flow in the main renal vein:  Present  Duplex Doppler Evaluation  Resistive index in main renal artery: 0.67  Venous waveform in main renal vein:  Present  Resistive index in upper pole intrarenal artery: 0.64  Resistive index in lower pole intrarenal artery: 0.66  Bladder: Small amount of fluid in the urinary bladder but, reportedly, the patient recently voided.  Other findings: Native right kidney is atrophic with a round hypoechoic cyst. Native left kidney is not well visualized.  IMPRESSION: Normal appearance of the transplant kidney.   Electronically Signed   By: Richarda Overlie [name]   On: 08/13/2014 18:12    Assessment/Plan:  Acute renal failure that is most likely the result of interaction between Azithromycin and prograf. The levels are generous and this could explain the increase. I think acute vascular rejection is unlikely and the U/S appears normal. I anticipate that there will be return of renal function  CKD 5 s/p renal transplant  HTN controlled  Some low BP hold amlodipine   LOS: 3 Burdette Gergely W @TODAY @2 :37 PM

## 2014-08-14 NOTE — Progress Notes (Signed)
Patient ID: [name], female   DOB: [birth year], 81 y.o.   MRN: [id number] Medicine attending: I examined this patient this morning together with resident physician Dr. Jill AlexandersAlexa [name] and [name] concur with her evaluation and management plan. As noted, she has a persistent, hacking, cough. Azithromycin started on admission now discontinued due to concern that it may be related to acute renal dysfunction. Diuretics on hold for similar concerns. Current creatinine at a plateau at 2.2 compared with 1.4 on admission January 10. Tacrolimus level in therapeutic range. We appreciate ongoing input from nephrology. We will continue to monitor her renal status closely.

## 2014-08-15 DIAGNOSIS — M109 Gout, unspecified: Secondary | ICD-10-CM

## 2014-08-15 LAB — RENAL FUNCTION PANEL
Albumin: 2.7 g/dL — ABNORMAL LOW (ref 3.5–5.2)
Anion gap: 19 — ABNORMAL HIGH (ref 5–15)
BUN: 28 mg/dL — AB (ref 6–23)
CALCIUM: 9.2 mg/dL (ref 8.4–10.5)
CHLORIDE: 95 meq/L — AB (ref 96–112)
CO2: 23 mmol/L (ref 19–32)
Creatinine, Ser: 1.78 mg/dL — ABNORMAL HIGH (ref 0.50–1.10)
GFR, EST AFRICAN AMERICAN: 30 mL/min — AB (ref >90)
GFR, EST NON AFRICAN AMERICAN: 26 mL/min — AB (ref >90)
Glucose, Bld: 98 mg/dL (ref 70–99)
Phosphorus: 3.8 mg/dL (ref 2.3–4.6)
Potassium: 3.9 mmol/L (ref 3.5–5.1)
Sodium: 137 mmol/L (ref 135–145)

## 2014-08-15 MED ORDER — COLCHICINE 0.6 MG PO TABS
1.2000 mg | ORAL_TABLET | Freq: Once | ORAL | Status: AC
  Administered 2014-08-15: 1.2 mg via ORAL
  Filled 2014-08-15: qty 2

## 2014-08-15 MED ORDER — CINACALCET HCL 30 MG PO TABS
45.0000 mg | ORAL_TABLET | Freq: Every day | ORAL | Status: DC
  Administered 2014-08-15 – 2014-08-16 (×2): 45 mg via ORAL
  Filled 2014-08-15 (×5): qty 1.5

## 2014-08-15 MED ORDER — HYDROCODONE-ACETAMINOPHEN 5-325 MG PO TABS
1.0000 | ORAL_TABLET | Freq: Four times a day (QID) | ORAL | Status: DC | PRN
  Administered 2014-08-15 – 2014-08-16 (×3): 1 via ORAL
  Filled 2014-08-15 (×3): qty 1

## 2014-08-15 MED ORDER — COLCHICINE 0.6 MG PO TABS
0.6000 mg | ORAL_TABLET | Freq: Once | ORAL | Status: AC
  Administered 2014-08-15: 0.6 mg via ORAL
  Filled 2014-08-15: qty 1

## 2014-08-15 MED ORDER — ACETAMINOPHEN 325 MG PO TABS: 650.0000 mg | ORAL_TABLET | Freq: Four times a day (QID) | ORAL | Status: DC | PRN

## 2014-08-15 NOTE — Care Management Note (Unsigned)
    Page 1 of 1   08/15/2014     5:18:54 PM CARE MANAGEMENT NOTE 08/15/2014  Patient:  [name],[name]   Account Number:  [id number]  Date Initiated:  08/15/2014  Documentation initiated by:  Letha CapeAYLOR,Aleesha Ringstad  Subjective/Objective Assessment:   dx chf, gout  admit-lives with grandson, he is in/out most of the time.     Action/Plan:   Anticipated DC Date:  08/16/2014   Anticipated DC Plan:  [name]      DC Planning Services  CM consult      Choice offered to / List presented to:             Status of service:  In process, will continue to follow Medicare Important Message given?  YES (If response is "NO", the following Medicare IM given date fields will be blank) Date Medicare IM given:  08/15/2014 Medicare IM given by:  Letha CapeAYLOR,Vernecia Umble Date Additional Medicare IM given:   Additional Medicare IM given by:    Discharge Disposition:    Per UR Regulation:  Reviewed for med. necessity/level of care/duration of stay  If discussed at Long Length of Stay Meetings, dates discussed:    Comments:

## 2014-08-15 NOTE — Progress Notes (Signed)
Medicine attending: I personally interviewed and examined this patient and reviewed pertinent medical, laboratory, and radiographic data and I concur with the evaluation and management plan as recorded by medical resident Dr. Jill AlexandersAlexa Richardson. Respiratory status and renal function improving. Unfortunately now she is having an acute gout flare left knee and right great toe and has been started on colchicine. Of note the patient only got 2 doses of azithromycin which was stopped when creatinine began to elevate. She was receiving concomitant parenteral diuresis.

## 2014-08-15 NOTE — Progress Notes (Addendum)
Subjective:  Patient was seen and examined this morning. Patient states shortness of breath and cough are improving, Cough is less frequent. However, patient developed pain in her left knee overnight that feels similar to her gout flares. Left knee is very pain, tender to touch and hot.   Objective: Vital signs in last 24 hours: Filed Vitals:   08/14/14 0815 08/14/14 2133 08/15/14 0202 08/15/14 0508  BP:  112/43  136/51  Pulse:      Temp:  98.3 F (36.8 C)  98.6 F (37 C)  TempSrc:  Oral  Oral  Resp:  20  20  Height:      Weight:   71.4 kg (157 lb 6.5 oz)   SpO2: 93% 100%  92%   Weight change:   Intake/Output Summary (Last 24 hours) at 08/15/14 1149 Last data filed at 08/14/14 2209  Gross per 24 hour  Intake    280 ml  Output      0 ml  Net    280 ml   General: Vital signs reviewed. Patient is well-developed and well-nourished, in no acute distress and cooperative with exam.  Cardiovascular: RRR Pulmonary/Chest: CTA b/l, no crackles or rhonchi.  Abdominal: Soft, non-tender, non-distended, BS + Extremities: Left knee warm and TTP. Not obviously erythematous or edematous. No lower extremity edema bilaterally Neurological: A&O x3 Skin: Warm, dry and intact. No rashes or erythema. Psychiatric: Normal mood and affect. speech and behavior is normal. Cognition and memory are normal.   Lab Results: Basic Metabolic Panel:  Recent Labs Lab 08/14/14 0512 08/15/14 0555  NA 131* 137  K 3.9 3.9  CL 97 95*  CO2 26 23  GLUCOSE 106* 98  BUN 30* 28*  CREATININE 2.24* 1.78*  CALCIUM 8.8 9.2  PHOS  --  3.8   CBC:  Recent Labs Lab 08/12/14 0130 08/13/14 0657  WBC 10.6* 10.1  HGB 10.9* 11.5*  HCT 32.5* 34.5*  MCV 89.3 91.0  PLT 256 310   Cardiac Enzymes:  Recent Labs Lab 08/11/14 2335 08/12/14 0130  TROPONINI 0.11* 0.11*   Studies/Results: US Renal Transplant W/doppler  08/13/2014   CLINICAL DATA:  Renal transplant with acute kidney injury.  EXAM: ULTRASOUND  OF RENAL TRANSPLANT WITH DOPPLER  TECHNIQUE: Ultrasound examination of the renal transplant was performed with gray-scale, color and duplex doppler evaluation.  COMPARISON:  CT 03/13/2014 and ultrasound 08/11/2004  FINDINGS: Transplant kidney location:  The right lower quadrant.  Transplant kidney:  Length: 11.9 cm. Normal in size and parenchymal echogenicity. No evidence of mass or hydronephrosis.  Color flow in the main renal artery:  Present  Color flow in the main renal vein:  Present  Duplex Doppler Evaluation  Resistive index in main renal artery: 0.67  Venous waveform in main renal vein:  Present  Resistive index in upper pole intrarenal artery: 0.64  Resistive index in lower pole intrarenal artery: 0.66  Bladder: Small amount of fluid in the urinary bladder but, reportedly, the patient recently voided.  Other findings: Native right kidney is atrophic with a round hypoechoic cyst. Native left kidney is not well visualized.  IMPRESSION: Normal appearance of the transplant kidney.   Electronically Signed   By: Richarda Overlie [name]   On: 08/13/2014 18:12   Medications: I have reviewed the patient's current medications. Scheduled Meds: . allopurinol  100 mg Oral QHS  . cinacalcet  45 mg Oral Q breakfast  . colchicine  0.6 mg Oral Once  . dextromethorphan-guaiFENesin  1 tablet  Oral BID  . enoxaparin (LOVENOX) injection  30 mg Subcutaneous Q24H  . metoprolol  100 mg Oral BID  . mycophenolate  500 mg Oral BID  . pantoprazole  20 mg Oral Daily  . simvastatin  20 mg Oral QHS  . sodium chloride  3 mL Intravenous Q12H  . tacrolimus  1.5 mg Oral BID   Continuous Infusions:  PRN Meds:.acetaminophen, chlorpheniramine-HYDROcodone, HYDROcodone-acetaminophen, ondansetron (ZOFRAN) IV, senna-docusate Assessment/Plan: Principal Problem:   Acute bronchitis Active Problems:   S/P kidney transplant   CAD S/P remote RCA PCI   Hypertension   Elevated troponin   Diastolic CHF, acute on chronic   Acute on chronic  renal insufficiency   Congestive heart disease   AKI (acute kidney injury)  Acute on Chronic Kidney s/p Kidney Transplant: Likely 2/2 azithromycin and over-diuresis with poor po intake. BUN/Cr 16/1.41 > 18/1.43>25/2.16>30/2.24>28/1.78, improving. Tacrolimus level normal at 12.4. Patient has had better urine output. Nephrology states AKI will continue to resolve slowly. They have signed off. AIN from Azithromycin can occur in 1% of patients.  -Measure I/Os -Sensipar 45 mg daily -Cellcept 500 mg BID -Prograf 1.5 mg BID  -Repeat renal function panel tomorrow am  Acute on Chronic Grade 2 Diastolic CHF: Resolved. Echo showed EF 60-65% and grade 1 diastolic dysfunction. Cardiology has signed off.  -Renal function panel tomorrow am -Daily weights -I/Os -Cardiac diet -ASA 81 mg daily -Metoprolol 100 mg BID -Hold lasix 80 mg po BID due to AKI  Acute Bronchitis: CXR showed mild diffuse peribronchial cuffing, suggestive of acute bronchitis.  -Tussionex BID prn -Mucinex DM BID  -Zofran 4 mg IV Q8H prn   Acute Gout Flare: Patient complains of new onset left knee pain similar to pain with prior gout flares. She is on allopurinol 100 mg QHS and colchicine 0.6 mg prn at home. She typically gets gout flares 3-4 times per year. Patient is afebrile.  -Allopurinol 100 mg QHS -Colchicine 1.2 mg once -Follow with colchicine 0.6 mg 1 hour later -Norco 5-325 mg 1 tab Q6H prn pain  HTN: BP well controlled. Patient is on amlodipine 5 mg daily, losartan 100 mg daily and metoprolol 100 mg BID at home. BP has been well controlled. Holding losartan and amlodipine.  -Metoprolol 100 mg BID  DVT/PE ppx: Lovenox 40 mg SQ daily  Dispo: Anticipated discharge to home approximately tomorrow.  The patient does have a current PCP Gara Kroner(Diana Truong, MD) and [facility] after discharge.  The patient does not have transportation limitations that hinder transportation to clinic  appointments.  .Services Needed at time of discharge: Y = Yes, Blank = No PT:   OT:   RN:   Equipment:   Other:     LOS: 4 days   Jill AlexandersAlexa Richardson, DO PGY-1 Internal Medicine Resident Pager # [phone] 08/15/2014 11:49 AM

## 2014-08-15 NOTE — Progress Notes (Signed)
[facility] ROUNDING NOTE   Subjective:   Interval History: no complaints Objective:  Vital signs in last 24 hours:  Temp:  [98.3 F (36.8 C)-98.6 F (37 C)] 98.6 F (37 C) (01/14 0508) Resp:  [20] 20 (01/14 0508) BP: (112-136)/(43-51) 136/51 mmHg (01/14 0508) SpO2:  [92 %-100 %] 92 % (01/14 0508) Weight:  [71.4 kg (157 lb 6.5 oz)] 71.4 kg (157 lb 6.5 oz) (01/14 0202)  Weight change:  Filed Weights   08/13/14 0500 08/14/14 0742 08/15/14 0202  Weight: 69.174 kg (152 lb 8 oz) 70.806 kg (156 lb 1.6 oz) 71.4 kg (157 lb 6.5 oz)    Intake/Output: I/O last 3 completed shifts: In: 1246.2 [P.O.:682; I.V.:564.2] Out: 300 [Urine:300]   Intake/Output this shift:     CVS- RRR RS- CTA ABD- BS present soft non-distended EXT- no edema   Basic Metabolic Panel:  Recent Labs Lab 08/11/14 1305 08/12/14 0130 08/13/14 0657 08/14/14 0512 08/15/14 0555  NA 135 134* 131* 131* 137  K 3.6 3.4* 3.5 3.9 3.9  CL 101 100 98 97 95*  CO2 GLUCOSE 116* 123* 96 106* 98  BUN 16 18 25* 30* 28*  CREATININE 1.41* 1.43* 2.16* 2.24* 1.78*  CALCIUM 10.5 9.9 8.7 8.8 9.2  PHOS  --   --   --   --  3.8    Liver Function Tests:  Recent Labs Lab 08/15/14 0555  ALBUMIN 2.7*   No results for input(s): LIPASE, AMYLASE in the last 168 hours. No results for input(s): AMMONIA in the last 168 hours.  CBC:  Recent Labs Lab 08/11/14 1305 08/12/14 0130 08/13/14 0657  WBC 11.4* 10.6* 10.1  HGB 11.9* 10.9* 11.5*  HCT 36.5 32.5* 34.5*  MCV 89.0 89.3 91.0  PLT 272 256 310    Cardiac Enzymes:  Recent Labs Lab 08/11/14 2335 08/12/14 0130  TROPONINI 0.11* 0.11*    BNP: Invalid input(s): POCBNP  CBG: No results for input(s): GLUCAP in the last 168 hours.  Microbiology: Results for orders placed or performed during the hospital encounter of 03/12/14  Clostridium Difficile by PCR     Status: None   Collection Time: 03/17/14  8:24 AM  Result Value Ref  Range Status   C difficile by pcr NEGATIVE NEGATIVE Final  Stool culture     Status: None   Collection Time: 03/17/14  8:24 AM  Result Value Ref Range Status   Specimen Description STOOL  Final   Special Requests NONE  Final   Culture   Final    NO SALMONELLA, SHIGELLA, CAMPYLOBACTER, YERSINIA, OR E.COLI 0157:H7 ISOLATED Performed at [facility]   Report Status 03/21/2014 FINAL  Final    Coagulation Studies: No results for input(s): LABPROT, INR in the last 72 hours.  Urinalysis:  Recent Labs  08/13/14 2317  COLORURINE YELLOW  LABSPEC 1.014  PHURINE 5.0  GLUCOSEU NEGATIVE  HGBUR NEGATIVE  BILIRUBINUR NEGATIVE  KETONESUR NEGATIVE  PROTEINUR NEGATIVE  UROBILINOGEN 1.0  NITRITE NEGATIVE  LEUKOCYTESUR SMALL*      Imaging: US Renal Transplant W/doppler  08/13/2014   CLINICAL DATA:  Renal transplant with acute kidney injury.  EXAM: ULTRASOUND OF RENAL TRANSPLANT WITH DOPPLER  TECHNIQUE: Ultrasound examination of the renal transplant was performed with gray-scale, color and duplex doppler evaluation.  COMPARISON:  CT 03/13/2014 and ultrasound 08/11/2004  FINDINGS: Transplant kidney location:  The right lower quadrant.  Transplant kidney:  Length: 11.9 cm. Normal in size and parenchymal echogenicity.  No evidence of mass or hydronephrosis.  Color flow in the main renal artery:  Present  Color flow in the main renal vein:  Present  Duplex Doppler Evaluation  Resistive index in main renal artery: 0.67  Venous waveform in main renal vein:  Present  Resistive index in upper pole intrarenal artery: 0.64  Resistive index in lower pole intrarenal artery: 0.66  Bladder: Small amount of fluid in the urinary bladder but, reportedly, the patient recently voided.  Other findings: Native right kidney is atrophic with a round hypoechoic cyst. Native left kidney is not well visualized.  IMPRESSION: Normal appearance of the transplant kidney.   Electronically Signed   By: Richarda OverlieAdam  Henn [name]   On:  08/13/2014 18:12     Medications:     . allopurinol  100 mg Oral QHS  . cinacalcet  45 mg Oral Q breakfast  . colchicine  0.6 mg Oral Once  . dextromethorphan-guaiFENesin  1 tablet Oral BID  . enoxaparin (LOVENOX) injection  30 mg Subcutaneous Q24H  . metoprolol  100 mg Oral BID  . mycophenolate  500 mg Oral BID  . pantoprazole  20 mg Oral Daily  . simvastatin  20 mg Oral QHS  . sodium chloride  3 mL Intravenous Q12H  . tacrolimus  1.5 mg Oral BID   acetaminophen, chlorpheniramine-HYDROcodone, HYDROcodone-acetaminophen, ondansetron (ZOFRAN) IV, senna-docusate  Assessment/ Plan:  81 y.o. female with a past medical history significant for CAD s/p distant RCA PCI and FSGN s/p renal tranplant as well as diastolic CHF . Baseline creatinine of 1.2 no episodes of rejections of infections. Stable on current dose of prograf. Prograf levels appeared generous 1/13 and had been receiving azithromycin 500mg  daily as well as lasix since admission. There has been some slight decreases noted in the blood pressure since admission but not less than 100mmHG. Renal U/S was unremarkable. There has been good urine output. Creatinine on admission was 1.4   Acute kidney injury resolving  Creatinine decreasing  Avoid any nephrotoxins and interactions and will sign off . Continue home dose of immunosuppressant   LOS: 4 [name] W @TODAY @11 :00 AM

## 2014-08-16 DIAGNOSIS — I509 Heart failure, unspecified: Secondary | ICD-10-CM

## 2014-08-16 LAB — RENAL FUNCTION PANEL
ALBUMIN: 2.7 g/dL — AB (ref 3.5–5.2)
Anion gap: 12 (ref 5–15)
BUN: 27 mg/dL — ABNORMAL HIGH (ref 6–23)
CO2: 26 mmol/L (ref 19–32)
CREATININE: 1.57 mg/dL — AB (ref 0.50–1.10)
Calcium: 8.6 mg/dL (ref 8.4–10.5)
Chloride: 96 mEq/L (ref 96–112)
GFR calc non Af Amer: 30 mL/min — ABNORMAL LOW (ref >90)
GFR, EST AFRICAN AMERICAN: 35 mL/min — AB (ref >90)
Glucose, Bld: 92 mg/dL (ref 70–99)
PHOSPHORUS: 4.5 mg/dL (ref 2.3–4.6)
Potassium: 4.1 mmol/L (ref 3.5–5.1)
SODIUM: 134 mmol/L — AB (ref 135–145)

## 2014-08-16 MED ORDER — COLCHICINE 0.6 MG PO TABS
0.6000 mg | ORAL_TABLET | Freq: Every day | ORAL | Status: DC
  Administered 2014-08-16 – 2014-08-17 (×2): 0.6 mg via ORAL
  Filled 2014-08-16 (×2): qty 1

## 2014-08-16 MED ORDER — CINACALCET HCL 30 MG PO TABS: 45.0000 mg | ORAL_TABLET | ORAL | Status: DC

## 2014-08-16 MED ORDER — MAGNESIUM HYDROXIDE 400 MG/5ML PO SUSP
5.0000 mL | Freq: Once | ORAL | Status: AC
  Administered 2014-08-16: 5 mL via ORAL
  Filled 2014-08-16: qty 30

## 2014-08-16 MED ORDER — POLYETHYLENE GLYCOL 3350 17 G PO PACK
17.0000 g | PACK | Freq: Every day | ORAL | Status: DC
  Administered 2014-08-17: 17 g via ORAL
  Filled 2014-08-16 (×3): qty 1

## 2014-08-16 MED ORDER — SENNOSIDES-DOCUSATE SODIUM 8.6-50 MG PO TABS
2.0000 | ORAL_TABLET | Freq: Two times a day (BID) | ORAL | Status: AC
  Administered 2014-08-16: 2 via ORAL
  Filled 2014-08-16: qty 2

## 2014-08-16 NOTE — Progress Notes (Signed)
Subjective:  Patient was seen and examined this morning. Patient states cough has greatly improved. She denies chest pain or shortness of breath. Patient continues to have pain in her left knee and left foot from gout. The pain medicine helps, but she doesn't like to take it too often. She does complain of constipation and has not had a bowel movement in 5 days. She is requesting milk of magnesia which she takes at home.  Objective: Vital signs in last 24 hours: Filed Vitals:   08/15/14 2123 08/15/14 2154 08/16/14 0555 08/16/14 0556  BP: 135/54 120/53 125/52   Pulse: 65 64 61   Temp:  98.2 F (36.8 C) 97.7 F (36.5 C)   TempSrc:  Oral Oral   Resp:  15 15   Height:      Weight:    70.806 kg (156 lb 1.6 oz)  SpO2:  95% 99%    Weight change: 0 kg (0 lb)  Intake/Output Summary (Last 24 hours) at 08/16/14 1122 Last data filed at 08/16/14 0940  Gross per 24 hour  Intake    240 ml  Output    300 ml  Net    -60 ml   General: Vital signs reviewed. Patient is well-developed and well-nourished, in no acute distress and cooperative with exam.  Cardiovascular: RRR Pulmonary/Chest: CTA b/l, no crackles or rhonchi.  Abdominal: Soft, non-tender, non-distended, BS + Extremities: Left knee warm and TTP. Not obviously erythematous or edematous. No lower extremity edema bilaterally. No calf tenderness. Neurological: A&O x3 Skin: Warm, dry and intact. No rashes or erythema. Psychiatric: Normal mood and affect. speech and behavior is normal. Cognition and memory are normal.   Lab Results: Basic Metabolic Panel:  Recent Labs Lab 08/15/14 0555 08/16/14 0646  NA 137 134*  K 3.9 4.1  CL 95* 96  CO2 23 26  GLUCOSE 98 92  BUN 28* 27*  CREATININE 1.78* 1.57*  CALCIUM 9.2 8.6  PHOS 3.8 4.5   CBC:  Recent Labs Lab 08/12/14 0130 08/13/14 0657  WBC 10.6* 10.1  HGB 10.9* 11.5*  HCT 32.5* 34.5*  MCV 89.3 91.0  PLT 256 310   Cardiac Enzymes:  Recent Labs Lab 08/11/14 2335  08/12/14 0130  TROPONINI 0.11* 0.11*   Studies/Results: No results found. Medications: I have reviewed the patient's current medications. Scheduled Meds: . allopurinol  100 mg Oral QHS  . cinacalcet  45 mg Oral Q breakfast  . colchicine  0.6 mg Oral Daily  . dextromethorphan-guaiFENesin  1 tablet Oral BID  . enoxaparin (LOVENOX) injection  30 mg Subcutaneous Q24H  . metoprolol  100 mg Oral BID  . mycophenolate  500 mg Oral BID  . pantoprazole  20 mg Oral Daily  . polyethylene glycol  17 g Oral Daily  . senna-docusate  2 tablet Oral BID  . simvastatin  20 mg Oral QHS  . sodium chloride  3 mL Intravenous Q12H  . tacrolimus  1.5 mg Oral BID   Continuous Infusions:  PRN Meds:.acetaminophen, chlorpheniramine-HYDROcodone, HYDROcodone-acetaminophen, ondansetron (ZOFRAN) IV Assessment/Plan: Principal Problem:   Acute bronchitis Active Problems:   S/P kidney transplant   CAD S/P remote RCA PCI   Hypertension   Elevated troponin   Diastolic CHF, acute on chronic   Acute on chronic renal insufficiency   Congestive heart disease   AKI (acute kidney injury)  Acute on Chronic Kidney s/p Kidney Transplant: Likely 2/2 azithromycin and over-diuresis with poor po intake. BUN/Cr 28/1.78>27/1.57, improving. AKI will likely continue to  resolve slowly over the next few day. Patient states she is getting too much sensipar and she only takes 45 mg every other day.  -Measure I/Os -Sensipar 45 mg every other day -Cellcept 500 mg BID -Prograf 1.5 mg BID  -Repeat renal function panel tomorrow am  Acute on Chronic Grade 2 Diastolic CHF: Resolved. Echo showed EF 60-65% and grade 1 diastolic dysfunction.  -Renal function panel tomorrow am -Daily weights -I/Os -Cardiac diet -ASA 81 mg daily -Metoprolol 100 mg BID -Hold home lasix in setting of AKI  Acute Bronchitis: Cough has improved, patient denies shortness of breath. Satting well on room air.  -Tussionex BID prn -Mucinex DM BID  -Zofran 4  mg IV Q8H prn   Acute Gout Flare: Continues to have pain, but controlled with medicine.  -Allopurinol 100 mg QHS -Colchicine 0.6 mg daily -Norco 5-325 mg 1 tab Q6H prn pain  HTN: BP well controlled, 120-130s/40-60s. Patient is on amlodipine 5 mg daily, losartan 100 mg daily and metoprolol 100 mg BID at home. Holding losartan and amlodipine.  -Metoprolol 100 mg BID  DVT/PE ppx: Lovenox 40 mg SQ daily  Dispo: Anticipated discharge to home approximately tomorrow.  The patient does have a current PCP Gara Kroner, MD) and does need an [facility] hospital follow-up appointment after discharge.  The patient does not have transportation limitations that hinder transportation to clinic appointments.  .Services Needed at time of discharge: Y = Yes, Blank = No PT:   OT:   RN:   Equipment:   Other:     LOS: 5 days   Jill Alexanders, DO PGY-1 Internal Medicine Resident Pager # [phone] 08/16/2014 11:22 AM

## 2014-08-16 NOTE — Discharge Summary (Signed)
Name: [name] MRN: [id number] DOB: 07/03/1932 81 y.o. PCP: Gara Kroneriana Truong, MD  Date of Admission: 08/11/2014  2:00 PM Date of Discharge: 08/16/2014 Attending Physician: Levert FeinsteinJames M Granfortuna, MD  Discharge Diagnosis:  Principal Problem:   Acute bronchitis Active Problems:   S/P kidney transplant   CAD S/P remote RCA PCI   Hypertension   Elevated troponin   Diastolic CHF, acute on chronic   Acute on chronic renal insufficiency   Congestive heart disease   AKI (acute kidney injury)  Discharge Medications:   Medication List    ASK your doctor about these medications        acetaminophen 500 MG tablet  Commonly known as:  TYLENOL  Take 1 tablet (500 mg total) by mouth every 6 (six) hours as needed for mild pain.     allopurinol 100 MG tablet  Commonly known as:  ZYLOPRIM  Take 100 mg by mouth at bedtime.     amLODipine 10 MG tablet  Commonly known as:  NORVASC  Take 5 mg by mouth Daily.     aspirin EC 81 MG tablet  Take 81 mg by mouth daily.     cinacalcet 90 MG tablet  Commonly known as:  SENSIPAR  Take 90 mg by mouth daily.     colchicine 0.6 MG tablet  Take 0.6 mg by mouth daily as needed. For gout     famotidine 20 MG tablet  Commonly known as:  PEPCID  Take 20 mg by mouth 2 (two) times daily.     furosemide 80 MG tablet  Commonly known as:  LASIX  Take 80 mg by mouth 2 (two) times daily.     losartan 100 MG tablet  Commonly known as:  COZAAR  Take 100 mg by mouth daily.     metoprolol 50 MG tablet  Commonly known as:  LOPRESSOR  Take 100 mg by mouth 2 (two) times daily.     mycophenolate 250 MG capsule  Commonly known as:  CELLCEPT  Take 500 mg by mouth 2 (two) times daily.     pantoprazole 20 MG tablet  Commonly known as:  PROTONIX  Take 1 tablet (20 mg total) by mouth daily.     senna-docusate 8.6-50 MG per tablet  Commonly known as:  Senokot-S  Take 1 tablet by mouth daily as needed for mild constipation.     simvastatin 20 MG tablet   Commonly known as:  ZOCOR  Take 20 mg by mouth at bedtime.     tacrolimus 0.5 MG capsule  Commonly known as:  PROGRAF  Take 1 capsule (0.5 mg total) by mouth daily. Take 1 capsule by mouth every morning along with one of the 1mg  capsules for a total dose of 1.5mg  in the morning and take 1 capsule by mouth every morning along with one of the 1mg  capsules for a total dose of 1.5mg  at night.     tacrolimus 1 MG capsule  Commonly known as:  PROGRAF  Take 1 capsule (1 mg total) by mouth 2 (two) times daily. Take 1 capsule (1 mg total) by mouth 2 (two) times daily.        Disposition and follow-up:   [name] [facility].  At the hospital follow up visit please address:  1.    2.  Labs / imaging needed at time of follow-up: BMET  3.  Pending labs/ test needing follow-up: None  Follow-up  Appointments: Follow-up Information    Follow up with Gust Rung, DO On 08/23/2014.   Specialty:  Internal Medicine   Why:  10:45 am   Contact information:   [address] [place] [address] [phone]        Consultations: Treatment Team:  Garnetta Buddy, MD  Procedures Performed:  X-ray Chest Pa And Lateral  08/12/2014   CLINICAL DATA:  Cough and congestion, headache for 2 weeks. Shortness of breath. History of CHF.  EXAM: CHEST  2 VIEW  COMPARISON:  Chest radiograph August 11, 2014 at 1449 hr  FINDINGS: The cardiac silhouette is not appears mildly enlarged. Mildly calcified aortic knob. Similar bandlike density in LEFT lung base. Mild bronchitic changes without pleural effusions or focal consolidations. No pneumothorax. Surgical clips in LEFT axilla. Osteopenia.  IMPRESSION: Mild cardiomegaly. Similar bronchitic changes without focal consolidation.  Similar LEFT fissure thickening. Previous recommendation for follow-up in 2-3 weeks stands.   Electronically Signed   By: Awilda Metro   On: 08/12/2014 00:45   Dg  Chest 2 View  08/11/2014   CLINICAL DATA:  81 year old female with history of cough and shortness breath for the past 2 weeks.  EXAM: CHEST  2 VIEW  COMPARISON:  Chest x-ray 07/11/2013.  FINDINGS: Mild diffuse peribronchial cuffing. Ill-defined opacity in the lower left hemithorax, which appears to correspond to some fascial thickening on the lateral view in the inferior aspect of the left major fissure. No confluent consolidative airspace disease. No pleural effusions. Mild cephalization of the pulmonary vasculature, without frank pulmonary edema. Mild cardiomegaly. Upper mediastinal contours are within normal limits. Atherosclerosis in the thoracic aorta.  IMPRESSION: 1. Mild diffuse peribronchial cuffing, suggestive of acute bronchitis. 2. Unusual appearance in the lower left hemithorax, which appears correspond is some thickening of the inferior aspect of the left major fissure. Repeat radiographs are recommended in 2-3 weeks after potential trial of antimicrobial therapy to ensure resolution of these findings. 3. Mild cardiomegaly with pulmonary venous congestion, but no frank pulmonary edema. 4. Atherosclerosis.   Electronically Signed   By: Trudie Reed [name]   On: 08/11/2014 15:28   US Renal Transplant W/doppler  08/13/2014   CLINICAL DATA:  Renal transplant with acute kidney injury.  EXAM: ULTRASOUND OF RENAL TRANSPLANT WITH DOPPLER  TECHNIQUE: Ultrasound examination of the renal transplant was performed with gray-scale, color and duplex doppler evaluation.  COMPARISON:  CT 03/13/2014 and ultrasound 08/11/2004  FINDINGS: Transplant kidney location:  The right lower quadrant.  Transplant kidney:  Length: 11.9 cm. Normal in size and parenchymal echogenicity. No evidence of mass or hydronephrosis.  Color flow in the main renal artery:  Present  Color flow in the main renal vein:  Present  Duplex Doppler Evaluation  Resistive index in main renal artery: 0.67  Venous waveform in main renal vein:  Present   Resistive index in upper pole intrarenal artery: 0.64  Resistive index in lower pole intrarenal artery: 0.66  Bladder: Small amount of fluid in the urinary bladder but, reportedly, the patient recently voided.  Other findings: Native right kidney is atrophic with a round hypoechoic cyst. Native left kidney is not well visualized.  IMPRESSION: Normal appearance of the transplant kidney.   Electronically Signed   By: Richarda Overlie [name]   On: 08/13/2014 18:12   Admission HPI: Ms. Dick is an 81 yo female with PMHx of renal transplant, CAD, HTN, diastolic CHF, CKD, gout, candidal esophagitis who presents with a 2.5 week history  of increasing shortness of breath and productive cough. Patient states she has become short of breath at rest and worsened with activity. She normally sleeps on 2 pillows at night, but has had to use 3 lately. She denies waking up in the middle of the night with shortness of breath. She denies weight gain or leg swelling. Patient has tried "everything" for her productive cough including mucinex and cough drops without relief. Sputum is white. Patient has sinus congestion and lacrimation. She denies fever, chills, nausea or vomiting. She has had occasional chest tightness about 2 times per week located substernal and to the left that does not radiate to her neck, back or left arm. Pain is sometimes associated with shortness of breath and cough, but not always. She rubs her chest which help the pain. These episodes do not last for long and are not associated with diaphoresis, nausea or vomiting. Patient has been taking all of her medications as prescribed, but did not take her morning doses today.   Hospital Course by problem list: Principal Problem:   Acute bronchitis Active Problems:   S/P kidney transplant   CAD S/P remote RCA PCI   Hypertension   Elevated troponin   Diastolic CHF, acute on chronic   Acute on chronic renal insufficiency   Congestive heart disease   AKI (acute kidney  injury)     Discharge Vitals:   BP 130/48 mmHg  Pulse 62  Temp(Src) 97.8 F (36.6 C) (Oral)  Resp 18  Ht  (1.6 m)  Wt 70.806 kg (156 lb 1.6 oz)  BMI 27.66 kg/m2  SpO2 91%  Discharge Labs:  Results for orders placed or performed during the hospital encounter of 08/11/14 (from the past 24 hour(s))  Renal function panel     Status: Abnormal   Collection Time: 08/16/14  6:46 AM  Result Value Ref Range   Sodium 134 (L) 135 - 145 mmol/L   Potassium 4.1 3.5 - 5.1 mmol/L   Chloride 96 96 - 112 mEq/L   CO2 26 19 - 32 mmol/L   Glucose, Bld 92 70 - 99 mg/dL   BUN 27 (H) 6 - 23 mg/dL   Creatinine, Ser 1.61 (H) 0.50 - 1.10 mg/dL   Calcium 8.6 8.4 - 09.6 mg/dL   Phosphorus 4.5 2.3 - 4.6 mg/dL   Albumin 2.7 (L) 3.5 - 5.2 g/dL   GFR calc non Af Amer 30 (L) >90 mL/min   GFR calc Af Amer 35 (L) >90 mL/min   Anion gap 12 5 - 15    Signed: Jill Alexanders, DO PGY-1 Internal Medicine Resident Pager # [phone] 08/16/2014 2:24 PM

## 2014-08-16 NOTE — Progress Notes (Signed)
Patient ID: [name], female   DOB: [birth year], 81 y.o.   MRN: [id number] Medicine attending discharge note: I personally interviewed and examined this patient today in anticipation of discharge tomorrow morning. I attest to the accuracy of the evaluation and management plan as recorded by resident physician Dr. Jill AlexandersAlexa Richardson in her January 15 progress note.  Clinical summary: 81 year old woman who is on chronic immunosuppressive therapy with both CellCept and Prograf status post renal transplant. She has a history of hypertension, type 2 diabetes, and grade 2 diastolic heart failure. She presented with an approximate 2 week history of a productive cough associated with increasing dyspnea. She was having some intermittent chest discomfort when she had paroxysms of coughing. On initial exam by Dr. Senaida Oresichardson, she was afebrile. Respiratory rate 22, Oxygen saturation 97% on room air. Positive jugular venous distention but no S3 gallop. No cardiac murmur. Bibasilar rales. No peripheral edema. Chest radiograph did not show any focal infiltrate or effusion.  Hospital course: In view of her chronic immunosuppression therapy I felt that she should be covered for viral and bacterial infection pending culture results. Initial plan was to start Tamiflu but a rapid influenza screen was negative. Single agent azithromycin was initiated and she received 2 total doses. Her cough was slow to improve but she made significant progress by time of discharge. There were borderline congestive changes on initial chest radiograph. She did not appear to be fluid overloaded clinically. She did receive a few initial doses of parenteral furosemide. Cardiology was consulted and recommended discontinuing parenteral diuretics.  There was a sudden rise in her creatinine from her baseline value recorded on 03/20/2014 of 0.95 to admission value of 1.41 with progressive rise up to 2.2 on January 13. Azithromycin was discontinued  to exclude drug-induced interstitial nephritis or interaction with her Prograf as etiology of the renal dysfunction. Another potential contributing factor was a transient fall in her blood pressure to 104/54 on January 12. With changes noted above including discontinuation of diuretics, creatinine trended down and was 1.6 at time of discharge January 15. There were no other complications. She will follow-up in our internal medicine clinic and with nephrology.

## 2014-08-17 ENCOUNTER — Encounter (HOSPITAL_COMMUNITY): Payer: Self-pay | Admitting: *Deleted

## 2014-08-17 LAB — RENAL FUNCTION PANEL
Albumin: 2.6 g/dL — ABNORMAL LOW (ref 3.5–5.2)
Anion gap: 6 (ref 5–15)
BUN: 25 mg/dL — ABNORMAL HIGH (ref 6–23)
CALCIUM: 8.4 mg/dL (ref 8.4–10.5)
CHLORIDE: 100 meq/L (ref 96–112)
CO2: 29 mmol/L (ref 19–32)
Creatinine, Ser: 1.35 mg/dL — ABNORMAL HIGH (ref 0.50–1.10)
GFR calc non Af Amer: 36 mL/min — ABNORMAL LOW (ref >90)
GFR, EST AFRICAN AMERICAN: 42 mL/min — AB (ref >90)
GLUCOSE: 94 mg/dL (ref 70–99)
POTASSIUM: 4 mmol/L (ref 3.5–5.1)
Phosphorus: 3.5 mg/dL (ref 2.3–4.6)
SODIUM: 135 mmol/L (ref 135–145)

## 2014-08-17 MED ORDER — FUROSEMIDE 80 MG PO TABS: 80.0000 mg | ORAL_TABLET | Freq: Every day | ORAL | Status: DC

## 2014-08-17 MED ORDER — HYDROCOD POLST-CHLORPHEN POLST 10-8 MG/5ML PO LQCR: 5.0000 mL | Freq: Two times a day (BID) | ORAL | Status: DC | PRN

## 2014-08-17 NOTE — Discharge Instructions (Signed)
1. You have the following appointments scheduled;  Gust RungHoffman, Erik C  On 08/23/2014 10:45 am  [address]  [place] [address] [phone]  Dyke MaesMattingly, Michael T  On 09/10/2014 3:00 pm  [address] [place] [address] [phone]  2. Please take all medications as prescribed.   Continue Amlodipine, Metoprolol, Prograf, and Cellcept as previously instructed.   Please take Lasix 80 mg DAILY until you are seen in the clinic.   DO NOT TAKE Losartan until your hospital follow up appointment.   Continue to take Colchicine 0.6 mg once daily until your gout pain resolves.   3. If you have worsening of your symptoms or new symptoms arise, please call the clinic [phone] or go to the ER immediately if symptoms are severe.  Acute Bronchitis Bronchitis is inflammation of the airways that extend from the windpipe into the lungs (bronchi). The inflammation often causes mucus to develop. This leads to a cough, which is the most common symptom of bronchitis.  In acute bronchitis, the condition usually develops suddenly and goes away over time, usually in a couple weeks. Smoking, allergies, and asthma can make bronchitis worse. Repeated episodes of bronchitis may cause further lung problems.  CAUSES Acute bronchitis is most often caused by the same virus that causes a cold. The virus can spread from person to person (contagious) through coughing, sneezing, and touching contaminated objects. SIGNS AND SYMPTOMS   Cough.   Fever.   Coughing up mucus.   Body aches.   Chest congestion.   Chills.   Shortness of breath.   Sore throat.  DIAGNOSIS  Acute bronchitis is usually diagnosed through a physical exam. Your health care provider will also ask you questions about your medical history. Tests, such as chest X-rays, are sometimes done to rule out other conditions.  TREATMENT  Acute bronchitis usually goes away in a couple weeks. Oftentimes, no medical treatment is  necessary. Medicines are sometimes given for relief of fever or cough. Antibiotic medicines are usually not needed but may be prescribed in certain situations. In some cases, an inhaler may be recommended to help reduce shortness of breath and control the cough. A cool mist vaporizer may also be used to help thin bronchial secretions and make it easier to clear the chest.  HOME CARE INSTRUCTIONS  Get plenty of rest.   Drink enough fluids to keep your urine clear or pale yellow (unless you have a medical condition that requires fluid restriction). Increasing fluids may help thin your respiratory secretions (sputum) and reduce chest congestion, and it will prevent dehydration.   Take medicines only as directed by your health care provider.  If you were prescribed an antibiotic medicine, finish it all even if you start to feel better.  Avoid smoking and secondhand smoke. Exposure to cigarette smoke or irritating chemicals will make bronchitis worse. If you are a smoker, consider using nicotine gum or skin patches to help control withdrawal symptoms. Quitting smoking will help your lungs heal faster.   Reduce the chances of another bout of acute bronchitis by washing your hands frequently, avoiding people with cold symptoms, and trying not to touch your hands to your mouth, nose, or eyes.   Keep all follow-up visits as directed by your health care provider.  SEEK MEDICAL CARE IF: Your symptoms do not improve after 1 week of treatment.  SEEK IMMEDIATE MEDICAL CARE IF:  You develop an increased fever or chills.   You have chest pain.   You have  severe shortness of breath.  You have bloody sputum.   You develop dehydration.  You faint or repeatedly feel like you are going to pass out.  You develop repeated vomiting.  You develop a severe headache. MAKE SURE YOU:   Understand these instructions.  Will watch your condition.  Will get help right away if you are not doing well  or get worse. Document Released: 08/26/2004 Document Revised: 12/03/2013 Document Reviewed: 01/09/2013 [facility] Patient Information 2015 [place], [place]. This information is not intended to replace advice given to you by your health care provider. Make sure you discuss any questions you have with your health care provider.

## 2014-08-17 NOTE — Progress Notes (Signed)
CARE MANAGEMENT NOTE 08/17/2014  Patient:  [name],[name]   Account Number:  [id number]  Date Initiated:  08/16/2014  Documentation initiated by:  Letha CapeAYLOR,Zarin Knupp  Subjective/Objective Assessment:   admit     Action/Plan:   Anticipated DC Date:  08/17/2014   Anticipated DC Plan:  HOME/SELF CARE      DC Planning Services  CM consult      Choice offered to / List presented to:             Status of service:  Completed, signed off Medicare Important Message given?  YES (If response is "NO", the following Medicare IM given date fields will be blank) Date Medicare IM given:  08/16/2014 Medicare IM given by:  Letha CapeAYLOR,Belkys Henault Date Additional Medicare IM given:   Additional Medicare IM given by:    Discharge Disposition:  HOME/SELF CARE  Per UR Regulation:  Reviewed for med. necessity/level of care/duration of stay  If discussed at Long Length of Stay Meetings, dates discussed:    Comments:  08/17/13 1804 Letha Capeeborah Wyett Narine RN, BSN [phone] patient dc to home, pta pt is indep.

## 2014-08-17 NOTE — Progress Notes (Signed)
Subjective: Feeling well today. Knee pain improved, no acute issues.   Objective: Vital signs in last 24 hours: Filed Vitals:   08/16/14 1348 08/16/14 2144 08/17/14 0553 08/17/14 0555  BP: 130/48 139/59 144/60   Pulse: 62 64    Temp: 97.8 F (36.6 C) 98.2 F (36.8 C) 98.4 F (36.9 C)   TempSrc: Oral Oral Oral   Resp: Height:      Weight:    154 lb 9.6 oz (70.126 kg)  SpO2: 91% 100% 99%    Weight change: -1 lb 8 oz (-0.68 kg)  Intake/Output Summary (Last 24 hours) at 08/17/14 1024 Last data filed at 08/16/14 2100  Gross per 24 hour  Intake    720 ml  Output   1150 ml  Net   -430 ml   Physical Exam: General: Elderly AA female, alert, cooperative, NAD. Intermittent cough.  HEENT: PERRL, EOMI. Moist mucus membranes Neck: Full range of motion without pain, supple, no lymphadenopathy or carotid bruits Lungs: Clear to ascultation bilaterally, normal work of respiration, no wheezes, rales. Some scattered rhonchi.  Heart: RRR, no murmurs, gallops, or rubs Abdomen: Soft, non-tender, non-distended, BS + Extremities: No cyanosis, clubbing, or edema Neurologic: Alert & oriented X3, cranial nerves II-XII intact, strength grossly intact, sensation intact to light touch    Lab Results: Basic Metabolic Panel:  Recent Labs Lab 08/16/14 0646 08/17/14 0550  NA 134* 135  K 4.1 4.0  CL 96 100  CO2 26 29  GLUCOSE 92 94  BUN 27* 25*  CREATININE 1.57* 1.35*  CALCIUM 8.6 8.4  PHOS 4.5 3.5   CBC:  Recent Labs Lab 08/12/14 0130 08/13/14 0657  WBC 10.6* 10.1  HGB 10.9* 11.5*  HCT 32.5* 34.5*  MCV 89.3 91.0  PLT 256 310   Cardiac Enzymes:  Recent Labs Lab 08/11/14 2335 08/12/14 0130  TROPONINI 0.11* 0.11*    Medications: I have reviewed the patient's current medications. Scheduled Meds: . allopurinol  100 mg Oral QHS  . [START ON 08/18/2014] cinacalcet  45 mg Oral Q48H  . colchicine  0.6 mg Oral Daily  . dextromethorphan-guaiFENesin  1 tablet Oral  BID  . enoxaparin (LOVENOX) injection  30 mg Subcutaneous Q24H  . metoprolol  100 mg Oral BID  . mycophenolate  500 mg Oral BID  . pantoprazole  20 mg Oral Daily  . polyethylene glycol  17 g Oral Daily  . simvastatin  20 mg Oral QHS  . sodium chloride  3 mL Intravenous Q12H  . tacrolimus  1.5 mg Oral BID   Continuous Infusions:  PRN Meds:.acetaminophen, chlorpheniramine-HYDROcodone, HYDROcodone-acetaminophen, ondansetron (ZOFRAN) IV  Assessment/Plan: 81 y/o F w/ PMHx of CKD s/p kidney transplant (2003) on Prograf and Cellcept, CAD, DM type II, chronic dCHF, and HTN, admitted for dCHF exacerbation and acute bronchitis.    Acute on CKD s/p Kidney Transplant: Renal function continues to improve, Cr trend as follows:   Recent Labs Lab 08/13/14 0657 08/14/14 0512 08/15/14 0555 08/16/14 0646 08/17/14 0550  CREATININE 2.16* 2.24* 1.78* 1.57* 1.35*  -Continue Sensipar 45 mg every other day -Continue Cellcept + Prograf  -Follow up BMP in clinic at hospital follow up on 08/23/14 -Hold Losartan  Acute on Chronic Grade 2 Diastolic CHF: Resolved. Echo showed EF 60-65% and grade 1 diastolic dysfunction.  -Continue ASA 81 mg daily -Metoprolol 100 mg BID -Restart Lasix 80 mg daily on discharge; takes 80 mg bid normally.   Acute Bronchitis: Cough continues to  improve.  -Discharge w/ Tussinec prn for cough  Acute Gout Flare: Improved.  -Continue Allopurinol 100 mg qhs -Continue Colchicine 0.6 mg daily until pain has resolved.  -Suggest repeat Uric acid on hospital follow up.   HTN: BP 144/60 this AM. Patient is on amlodipine 5 mg daily, losartan 100 mg daily and metoprolol 100 mg BID at home.  -Continue Metoprolol 100 mg bid -Restart Amlodipine 5 mg daily today -Hold Losartan given AKI; restart on hospital follow up.   DVT/PE ppx: Lovenox [place]  Dispo: Anticipated discharge today.  The patient does have a current PCP Gara Kroner(Diana Truong, MD) and [facility].  The patient does not have transportation limitations that hinder transportation to clinic appointments.  .Services Needed at time of discharge: Y = Yes, Blank = No PT:   OT:   RN:   Equipment:   Other:     LOS: 6 days   Signed: Lars MassonJones, Sanyiah Kanzler, MD 08/17/2014 10:36 AM

## 2014-08-19 ENCOUNTER — Other Ambulatory Visit: Payer: Self-pay | Admitting: Internal Medicine

## 2014-08-19 NOTE — Discharge Summary (Signed)
Name: [name] MRN: [id number] DOB: [birth year] 81 y.o. PCP: Gara Kroner, MD  Date of Admission: 08/11/2014  2:00 PM Date of Discharge: 08/20/2014 Attending Physician: No att. providers found  Discharge Diagnosis:  Principal Problem:   Acute bronchitis Active Problems:   S/P kidney transplant   CAD S/P remote RCA PCI   Hypertension   Diastolic CHF, acute on chronic   Acute on chronic renal insufficiency   AKI (acute kidney injury)  Discharge Medications:   Medication List    STOP taking these medications        acetaminophen 500 MG tablet  Commonly known as:  TYLENOL     losartan 100 MG tablet  Commonly known as:  COZAAR      TAKE these medications        allopurinol 100 MG tablet  Commonly known as:  ZYLOPRIM  Take 100 mg by mouth at bedtime.     amLODipine 10 MG tablet  Commonly known as:  NORVASC  Take 5 mg by mouth Daily.     aspirin EC 81 MG tablet  Take 81 mg by mouth daily.     chlorpheniramine-HYDROcodone 10-8 MG/5ML Lqcr  Commonly known as:  TUSSIONEX  Take 5 mLs by mouth every 12 (twelve) hours as needed for cough.     cinacalcet 90 MG tablet  Commonly known as:  SENSIPAR  Take 90 mg by mouth daily.     colchicine 0.6 MG tablet  Take 0.6 mg by mouth daily as needed. For gout     famotidine 20 MG tablet  Commonly known as:  PEPCID  Take 20 mg by mouth 2 (two) times daily.     furosemide 80 MG tablet  Commonly known as:  LASIX  Take 1 tablet (80 mg total) by mouth daily.     metoprolol 50 MG tablet  Commonly known as:  LOPRESSOR  Take 100 mg by mouth 2 (two) times daily.     mycophenolate 250 MG capsule  Commonly known as:  CELLCEPT  Take 500 mg by mouth 2 (two) times daily.     pantoprazole 20 MG tablet  Commonly known as:  PROTONIX  Take 1 tablet (20 mg total) by mouth daily.     senna-docusate 8.6-50 MG per tablet  Commonly known as:  Senokot-S  Take 1 tablet by mouth daily as needed for mild constipation.     simvastatin 20 MG tablet  Commonly known as:  ZOCOR  Take 20 mg by mouth at bedtime.     tacrolimus 0.5 MG capsule  Commonly known as:  PROGRAF  Take 1 capsule (0.5 mg total) by mouth daily. Take 1 capsule by mouth every morning along with one of the 1mg  capsules for a total dose of 1.5mg  in the morning and take 1 capsule by mouth every morning along with one of the 1mg  capsules for a total dose of 1.5mg  at night.     tacrolimus 1 MG capsule  Commonly known as:  PROGRAF  Take 1 capsule (1 mg total) by mouth 2 (two) times daily. Take 1 capsule (1 mg total) by mouth 2 (two) times daily.        Disposition and follow-up:   [name] was discharged from [facility] in Good condition.  At the hospital follow up visit please address:  1.  Bronchitis: Resolution of cough and shortness of breath.  Acute on Chronic CHF: Resolution of symptoms.   Gout: Resolution of pain  and gout flare. Please recheck uric acid level for baseline and titrate up allopurinol as appropriate.   AKI: Resolution of AKI.   2.  Labs / imaging needed at time of follow-up: uric acid, bmet  3.  Pending labs/ test needing follow-up: None  Follow-up Appointments: Follow-up Information    Follow up with Gust Rung, DO On 08/23/2014.   Specialty:  Internal Medicine   Why:  10:45 am   Contact information:   [address]  [place] [address] [phone]       Follow up with Dyke Maes, MD On 09/10/2014.   Specialty:  Nephrology   Why:  3:00 pm   Contact information:   [address] [place] [address] [phone]       Discharge Instructions: Discharge Instructions    Diet - low sodium heart healthy    Complete by:  As directed      Increase activity slowly    Complete by:  As directed            Consultations: Treatment Team:  Garnetta Buddy, MD  Procedures Performed:  X-ray Chest Pa And Lateral  08/12/2014   CLINICAL DATA:  Cough and congestion,  headache for 2 weeks. Shortness of breath. History of CHF.  EXAM: CHEST  2 VIEW  COMPARISON:  Chest radiograph August 11, 2014 at 1449 hr  FINDINGS: The cardiac silhouette is not appears mildly enlarged. Mildly calcified aortic knob. Similar bandlike density in LEFT lung base. Mild bronchitic changes without pleural effusions or focal consolidations. No pneumothorax. Surgical clips in LEFT axilla. Osteopenia.  IMPRESSION: Mild cardiomegaly. Similar bronchitic changes without focal consolidation.  Similar LEFT fissure thickening. Previous recommendation for follow-up in 2-3 weeks stands.   Electronically Signed   By: Awilda Metro   On: 08/12/2014 00:45   Dg Chest 2 View  08/11/2014   CLINICAL DATA:  81 year old female with history of cough and shortness breath for the past 2 weeks.  EXAM: CHEST  2 VIEW  COMPARISON:  Chest x-ray 07/11/2013.  FINDINGS: Mild diffuse peribronchial cuffing. Ill-defined opacity in the lower left hemithorax, which appears to correspond to some fascial thickening on the lateral view in the inferior aspect of the left major fissure. No confluent consolidative airspace disease. No pleural effusions. Mild cephalization of the pulmonary vasculature, without frank pulmonary edema. Mild cardiomegaly. Upper mediastinal contours are within normal limits. Atherosclerosis in the thoracic aorta.  IMPRESSION: 1. Mild diffuse peribronchial cuffing, suggestive of acute bronchitis. 2. Unusual appearance in the lower left hemithorax, which appears correspond is some thickening of the inferior aspect of the left major fissure. Repeat radiographs are recommended in 2-3 weeks after potential trial of antimicrobial therapy to ensure resolution of these findings. 3. Mild cardiomegaly with pulmonary venous congestion, but no frank pulmonary edema. 4. Atherosclerosis.   Electronically Signed   By: Trudie Reed [name]   On: 08/11/2014 15:28   US Renal Transplant W/doppler  08/13/2014   CLINICAL DATA:   Renal transplant with acute kidney injury.  EXAM: ULTRASOUND OF RENAL TRANSPLANT WITH DOPPLER  TECHNIQUE: Ultrasound examination of the renal transplant was performed with gray-scale, color and duplex doppler evaluation.  COMPARISON:  CT 03/13/2014 and ultrasound 08/11/2004  FINDINGS: Transplant kidney location:  The right lower quadrant.  Transplant kidney:  Length: 11.9 cm. Normal in size and parenchymal echogenicity. No evidence of mass or hydronephrosis.  Color flow in the main renal artery:  Present  Color flow in the main renal  vein:  Present  Duplex Doppler Evaluation  Resistive index in main renal artery: 0.67  Venous waveform in main renal vein:  Present  Resistive index in upper pole intrarenal artery: 0.64  Resistive index in lower pole intrarenal artery: 0.66  Bladder: Small amount of fluid in the urinary bladder but, reportedly, the patient recently voided.  Other findings: Native right kidney is atrophic with a round hypoechoic cyst. Native left kidney is not well visualized.  IMPRESSION: Normal appearance of the transplant kidney.   Electronically Signed   By: Richarda Overlie [name]   On: 08/13/2014 18:12   Admission HPI: [name] is an 81 yo female with PMHx of renal transplant, CAD, HTN, diastolic CHF, CKD, gout, candidal esophagitis who presents with a 2.5 week history of increasing shortness of breath and productive cough. Patient states she has become short of breath at rest and worsened with activity. She normally sleeps on 2 pillows at night, but has had to use 3 lately. She denies waking up in the middle of the night with shortness of breath. She denies weight gain or leg swelling. Patient has tried "everything" for her productive cough including mucinex and cough drops without relief. Sputum is white. Patient has sinus congestion and lacrimation. She denies fever, chills, nausea or vomiting. She has had occasional chest tightness about 2 times per week located substernal and to the left that  does not radiate to her neck, back or left arm. Pain is sometimes associated with shortness of breath and cough, but not always. She rubs her chest which help the pain. These episodes do not last for long and are not associated with diaphoresis, nausea or vomiting. Patient has been taking all of her medications as prescribed, but did not take her morning doses today.   Hospital Course by problem list: Principal Problem:   Acute bronchitis Active Problems:   S/P kidney transplant   CAD S/P remote RCA PCI   Hypertension   Diastolic CHF, acute on chronic   Acute on chronic renal insufficiency   AKI (acute kidney injury)   Acute on Chronic Grade 2 Diastolic Dysfunction: Last echo 07/2013 showed EF of 60-65% and grade 2 diastolic dysfunction. Patient presented with worsening shortness of breath with ambulation and at rest and orthopnea. Vital signs on admission showed the patient was afebrile 97.9, tachycardic 95, RR 22, BP 145/55, 97% on room air. Physical exam was remarkable for JVD and crackles in lower lung fields. Labs showed mild leukocytosis of 11.4. BNP 270.9. CXR showed mild cardiomegaly with pulmonary venous congestion, but no frank pulmonary edema. Patient was seen by cardiology who agreed with diuresis and repeating echo. Patient was diuresed with good urine output with resolution of CHF exacerbation. Cardiology signed off and recommended continuing home lasix of 80 mg BID, but patient developed AKI so lasix was held. Once AKI resolved, patient was discharged on lasix 80 mg BID, ASA 81 mg daily and metoprolol 100 mg BID.  Viral Bronchitis: Patient complained of productive cough and nasal congestion for 2 weeks. Patient was afebrile with mild leukocytosis. CXR showed mild diffuse peribronchial cuffing, suggestive of acute bronchitis. Unusual appearance in the lower left hemithorax, which appears correspond is some thickening of the inferior aspect of the left major fissure. Repeat radiographs  are recommended in 2-3 weeks after potential trial of antimicrobial therapy to ensure resolution of these findings. Patient was started on azithromycin which caused AKI. Azithromycin can cause AIN in 1% of patients. This was discontinued and  patient was managed on tussionex and mucinex.   Acute Gout Flare: Patient complained of new onset left knee pain similar to pain with prior gout flares. She is on allopurinol 100 mg QHS and colchicine 0.6 mg prn at home. She typically gets gout flares 3-4 times per year. Patient was continued on her home Allopurinol 100 mg QHS. She was given Colchicine 1.2 mg once and followed with colchicine 0.6 mg 1 hour later. Please consider rechecking uric acid when gout flare resolved and uptitrating allopurinol as appropriate.   HTN: BP remained well controlled. Patient is on amlodipine 5 mg daily, losartan 100 mg daily and metoprolol 100 mg BID at home. We continued metoprolol and amlodipine, but held losartan given AKI and normotensive.  Acute on Chronic Kidney s/p Kidney Transplant: Likely 2/2 azithromycin and over-diuresis with poor po intake. Cr trended 1.41 >1.43>2.16>2.24>1.78>1.57>1.35, back to baseline. Tacrolimus level was normal at 12.4. AIN from Azithromycin can occur in 1% of patients. Patient was discharged on her home medications. Patient states she takes sensipar 45 mg every other day, cellcept 500 mg BID. There was a discrepancy of whether patient takes prograf 1.5 bid or 2.5 bid. Patient believes the latter, but most current nephrology office notes states 1.5 bid.   Discharge Vitals:   BP 144/60 mmHg  Pulse 64  Temp(Src) 98.4 F (36.9 C) (Oral)  Resp 15  Ht 5\' 3"  (1.6 m)  Wt 70.126 kg (154 lb 9.6 oz)  BMI 27.39 kg/m2  SpO2 99%  Signed: Jill AlexandersAlexa Richardson, DO PGY-1 Internal Medicine Resident Pager # [phone] 08/20/2014 9:40 PM

## 2014-08-23 ENCOUNTER — Ambulatory Visit: Payer: Medicare Other | Admitting: Internal Medicine

## 2014-09-03 ENCOUNTER — Telehealth: Payer: Self-pay | Admitting: Internal Medicine

## 2014-09-03 NOTE — Telephone Encounter (Signed)
Call to patient to confirm appointment for 09/04/14 at 2:15. LMTCB ° °

## 2014-09-03 NOTE — Telephone Encounter (Signed)
Patient Confirmed

## 2014-09-04 ENCOUNTER — Encounter: Payer: Medicare Other | Admitting: Internal Medicine

## 2014-09-06 DIAGNOSIS — E212 Other hyperparathyroidism: Secondary | ICD-10-CM | POA: Diagnosis not present

## 2014-09-06 DIAGNOSIS — Z940 Kidney transplant status: Secondary | ICD-10-CM | POA: Diagnosis not present

## 2014-09-10 DIAGNOSIS — E212 Other hyperparathyroidism: Secondary | ICD-10-CM | POA: Diagnosis not present

## 2014-09-10 DIAGNOSIS — I10 Essential (primary) hypertension: Secondary | ICD-10-CM | POA: Diagnosis not present

## 2014-09-10 DIAGNOSIS — Z940 Kidney transplant status: Secondary | ICD-10-CM | POA: Diagnosis not present

## 2014-09-10 DIAGNOSIS — N183 Chronic kidney disease, stage 3 (moderate): Secondary | ICD-10-CM | POA: Diagnosis not present

## 2014-09-19 DIAGNOSIS — Z940 Kidney transplant status: Secondary | ICD-10-CM | POA: Diagnosis not present

## 2014-11-28 DIAGNOSIS — E212 Other hyperparathyroidism: Secondary | ICD-10-CM | POA: Diagnosis not present

## 2014-11-28 DIAGNOSIS — Z940 Kidney transplant status: Secondary | ICD-10-CM | POA: Diagnosis not present

## 2014-12-16 DIAGNOSIS — E212 Other hyperparathyroidism: Secondary | ICD-10-CM | POA: Diagnosis not present

## 2014-12-16 DIAGNOSIS — I10 Essential (primary) hypertension: Secondary | ICD-10-CM | POA: Diagnosis not present

## 2014-12-16 DIAGNOSIS — Z940 Kidney transplant status: Secondary | ICD-10-CM | POA: Diagnosis not present

## 2014-12-16 DIAGNOSIS — N183 Chronic kidney disease, stage 3 (moderate): Secondary | ICD-10-CM | POA: Diagnosis not present

## 2015-01-23 DIAGNOSIS — M25561 Pain in right knee: Secondary | ICD-10-CM | POA: Diagnosis not present

## 2015-02-04 DIAGNOSIS — M25561 Pain in right knee: Secondary | ICD-10-CM | POA: Diagnosis not present

## 2015-02-04 DIAGNOSIS — M1711 Unilateral primary osteoarthritis, right knee: Secondary | ICD-10-CM | POA: Diagnosis not present

## 2015-02-25 DIAGNOSIS — M1711 Unilateral primary osteoarthritis, right knee: Secondary | ICD-10-CM | POA: Diagnosis not present

## 2015-03-04 DIAGNOSIS — M1711 Unilateral primary osteoarthritis, right knee: Secondary | ICD-10-CM | POA: Diagnosis not present

## 2015-03-11 DIAGNOSIS — M1711 Unilateral primary osteoarthritis, right knee: Secondary | ICD-10-CM | POA: Diagnosis not present

## 2015-04-01 DIAGNOSIS — Z79899 Other long term (current) drug therapy: Secondary | ICD-10-CM | POA: Diagnosis not present

## 2015-04-01 DIAGNOSIS — E785 Hyperlipidemia, unspecified: Secondary | ICD-10-CM | POA: Diagnosis not present

## 2015-04-01 DIAGNOSIS — Z940 Kidney transplant status: Secondary | ICD-10-CM | POA: Diagnosis not present

## 2015-04-01 DIAGNOSIS — E212 Other hyperparathyroidism: Secondary | ICD-10-CM | POA: Diagnosis not present

## 2015-04-01 DIAGNOSIS — M109 Gout, unspecified: Secondary | ICD-10-CM | POA: Diagnosis not present

## 2015-04-04 DIAGNOSIS — Z940 Kidney transplant status: Secondary | ICD-10-CM | POA: Diagnosis not present

## 2015-04-14 DIAGNOSIS — Z940 Kidney transplant status: Secondary | ICD-10-CM | POA: Diagnosis not present

## 2015-04-15 ENCOUNTER — Ambulatory Visit: Payer: Medicare Other | Admitting: Internal Medicine

## 2015-05-02 DIAGNOSIS — M1711 Unilateral primary osteoarthritis, right knee: Secondary | ICD-10-CM | POA: Diagnosis not present

## 2015-07-03 DIAGNOSIS — Z940 Kidney transplant status: Secondary | ICD-10-CM | POA: Diagnosis not present

## 2015-07-03 DIAGNOSIS — E212 Other hyperparathyroidism: Secondary | ICD-10-CM | POA: Diagnosis not present

## 2015-07-03 DIAGNOSIS — Z79899 Other long term (current) drug therapy: Secondary | ICD-10-CM | POA: Diagnosis not present

## 2015-07-07 DIAGNOSIS — Z940 Kidney transplant status: Secondary | ICD-10-CM | POA: Diagnosis not present

## 2015-09-11 DIAGNOSIS — E212 Other hyperparathyroidism: Secondary | ICD-10-CM | POA: Diagnosis not present

## 2015-09-11 DIAGNOSIS — Z940 Kidney transplant status: Secondary | ICD-10-CM | POA: Diagnosis not present

## 2015-09-11 DIAGNOSIS — Z79899 Other long term (current) drug therapy: Secondary | ICD-10-CM | POA: Diagnosis not present

## 2015-09-11 DIAGNOSIS — M109 Gout, unspecified: Secondary | ICD-10-CM | POA: Diagnosis not present

## 2015-09-15 DIAGNOSIS — E212 Other hyperparathyroidism: Secondary | ICD-10-CM | POA: Diagnosis not present

## 2015-09-15 DIAGNOSIS — Z940 Kidney transplant status: Secondary | ICD-10-CM | POA: Diagnosis not present

## 2015-09-15 DIAGNOSIS — I10 Essential (primary) hypertension: Secondary | ICD-10-CM | POA: Diagnosis not present

## 2015-09-15 DIAGNOSIS — N183 Chronic kidney disease, stage 3 (moderate): Secondary | ICD-10-CM | POA: Diagnosis not present

## 2015-12-04 DIAGNOSIS — M25561 Pain in right knee: Secondary | ICD-10-CM | POA: Diagnosis not present

## 2015-12-04 DIAGNOSIS — M1712 Unilateral primary osteoarthritis, left knee: Secondary | ICD-10-CM | POA: Diagnosis not present

## 2015-12-04 DIAGNOSIS — M170 Bilateral primary osteoarthritis of knee: Secondary | ICD-10-CM | POA: Diagnosis not present

## 2015-12-04 DIAGNOSIS — M25562 Pain in left knee: Secondary | ICD-10-CM | POA: Diagnosis not present

## 2015-12-18 ENCOUNTER — Encounter (HOSPITAL_COMMUNITY): Payer: Self-pay | Admitting: Emergency Medicine

## 2015-12-18 ENCOUNTER — Emergency Department (HOSPITAL_COMMUNITY): Payer: Medicare Other

## 2015-12-18 ENCOUNTER — Inpatient Hospital Stay (HOSPITAL_COMMUNITY)
Admission: EM | Admit: 2015-12-18 | Discharge: 2016-01-01 | DRG: 193 | Disposition: A | Discharge status: Home Health | Payer: Medicare Other | Attending: Internal Medicine | Admitting: Internal Medicine

## 2015-12-18 DIAGNOSIS — R05 Cough: Secondary | ICD-10-CM | POA: Diagnosis not present

## 2015-12-18 DIAGNOSIS — J9601 Acute respiratory failure with hypoxia: Secondary | ICD-10-CM | POA: Diagnosis not present

## 2015-12-18 DIAGNOSIS — R34 Anuria and oliguria: Secondary | ICD-10-CM | POA: Diagnosis not present

## 2015-12-18 DIAGNOSIS — Z87891 Personal history of nicotine dependence: Secondary | ICD-10-CM

## 2015-12-18 DIAGNOSIS — Z882 Allergy status to sulfonamides status: Secondary | ICD-10-CM | POA: Diagnosis not present

## 2015-12-18 DIAGNOSIS — I2510 Atherosclerotic heart disease of native coronary artery without angina pectoris: Secondary | ICD-10-CM

## 2015-12-18 DIAGNOSIS — R7881 Bacteremia: Secondary | ICD-10-CM | POA: Diagnosis present

## 2015-12-18 DIAGNOSIS — R0602 Shortness of breath: Secondary | ICD-10-CM

## 2015-12-18 DIAGNOSIS — J440 Chronic obstructive pulmonary disease with acute lower respiratory infection: Secondary | ICD-10-CM | POA: Diagnosis present

## 2015-12-18 DIAGNOSIS — N183 Chronic kidney disease, stage 3 unspecified: Secondary | ICD-10-CM

## 2015-12-18 DIAGNOSIS — I5032 Chronic diastolic (congestive) heart failure: Secondary | ICD-10-CM | POA: Diagnosis not present

## 2015-12-18 DIAGNOSIS — R918 Other nonspecific abnormal finding of lung field: Secondary | ICD-10-CM

## 2015-12-18 DIAGNOSIS — Z7982 Long term (current) use of aspirin: Secondary | ICD-10-CM

## 2015-12-18 DIAGNOSIS — Z940 Kidney transplant status: Secondary | ICD-10-CM | POA: Diagnosis not present

## 2015-12-18 DIAGNOSIS — D638 Anemia in other chronic diseases classified elsewhere: Secondary | ICD-10-CM | POA: Diagnosis present

## 2015-12-18 DIAGNOSIS — I5033 Acute on chronic diastolic (congestive) heart failure: Secondary | ICD-10-CM | POA: Diagnosis not present

## 2015-12-18 DIAGNOSIS — I25119 Atherosclerotic heart disease of native coronary artery with unspecified angina pectoris: Secondary | ICD-10-CM | POA: Diagnosis present

## 2015-12-18 DIAGNOSIS — E785 Hyperlipidemia, unspecified: Secondary | ICD-10-CM | POA: Diagnosis present

## 2015-12-18 DIAGNOSIS — E212 Other hyperparathyroidism: Secondary | ICD-10-CM | POA: Diagnosis present

## 2015-12-18 DIAGNOSIS — Z9861 Coronary angioplasty status: Secondary | ICD-10-CM

## 2015-12-18 DIAGNOSIS — J189 Pneumonia, unspecified organism: Secondary | ICD-10-CM | POA: Diagnosis not present

## 2015-12-18 DIAGNOSIS — Z79899 Other long term (current) drug therapy: Secondary | ICD-10-CM | POA: Diagnosis not present

## 2015-12-18 DIAGNOSIS — I447 Left bundle-branch block, unspecified: Secondary | ICD-10-CM | POA: Diagnosis present

## 2015-12-18 DIAGNOSIS — R9439 Abnormal result of other cardiovascular function study: Secondary | ICD-10-CM | POA: Diagnosis not present

## 2015-12-18 DIAGNOSIS — I361 Nonrheumatic tricuspid (valve) insufficiency: Secondary | ICD-10-CM | POA: Diagnosis present

## 2015-12-18 DIAGNOSIS — I340 Nonrheumatic mitral (valve) insufficiency: Secondary | ICD-10-CM | POA: Diagnosis present

## 2015-12-18 DIAGNOSIS — I480 Paroxysmal atrial fibrillation: Secondary | ICD-10-CM | POA: Diagnosis not present

## 2015-12-18 DIAGNOSIS — M109 Gout, unspecified: Secondary | ICD-10-CM | POA: Diagnosis present

## 2015-12-18 DIAGNOSIS — Z955 Presence of coronary angioplasty implant and graft: Secondary | ICD-10-CM | POA: Diagnosis not present

## 2015-12-18 DIAGNOSIS — I5030 Unspecified diastolic (congestive) heart failure: Secondary | ICD-10-CM | POA: Diagnosis present

## 2015-12-18 DIAGNOSIS — I4891 Unspecified atrial fibrillation: Secondary | ICD-10-CM | POA: Diagnosis not present

## 2015-12-18 DIAGNOSIS — I130 Hypertensive heart and chronic kidney disease with heart failure and stage 1 through stage 4 chronic kidney disease, or unspecified chronic kidney disease: Secondary | ICD-10-CM | POA: Diagnosis present

## 2015-12-18 DIAGNOSIS — E871 Hypo-osmolality and hyponatremia: Secondary | ICD-10-CM | POA: Diagnosis present

## 2015-12-18 DIAGNOSIS — I214 Non-ST elevation (NSTEMI) myocardial infarction: Secondary | ICD-10-CM | POA: Diagnosis not present

## 2015-12-18 DIAGNOSIS — Z9889 Other specified postprocedural states: Secondary | ICD-10-CM

## 2015-12-18 DIAGNOSIS — R7989 Other specified abnormal findings of blood chemistry: Secondary | ICD-10-CM | POA: Diagnosis not present

## 2015-12-18 DIAGNOSIS — J90 Pleural effusion, not elsewhere classified: Secondary | ICD-10-CM

## 2015-12-18 DIAGNOSIS — Z8249 Family history of ischemic heart disease and other diseases of the circulatory system: Secondary | ICD-10-CM | POA: Diagnosis not present

## 2015-12-18 DIAGNOSIS — J13 Pneumonia due to Streptococcus pneumoniae: Secondary | ICD-10-CM

## 2015-12-18 DIAGNOSIS — E1122 Type 2 diabetes mellitus with diabetic chronic kidney disease: Secondary | ICD-10-CM | POA: Diagnosis present

## 2015-12-18 DIAGNOSIS — R001 Bradycardia, unspecified: Secondary | ICD-10-CM | POA: Diagnosis not present

## 2015-12-18 DIAGNOSIS — N179 Acute kidney failure, unspecified: Secondary | ICD-10-CM | POA: Diagnosis present

## 2015-12-18 DIAGNOSIS — R069 Unspecified abnormalities of breathing: Secondary | ICD-10-CM | POA: Diagnosis not present

## 2015-12-18 DIAGNOSIS — R778 Other specified abnormalities of plasma proteins: Secondary | ICD-10-CM

## 2015-12-18 DIAGNOSIS — R000 Tachycardia, unspecified: Secondary | ICD-10-CM

## 2015-12-18 DIAGNOSIS — R531 Weakness: Secondary | ICD-10-CM | POA: Diagnosis not present

## 2015-12-18 DIAGNOSIS — I5031 Acute diastolic (congestive) heart failure: Secondary | ICD-10-CM | POA: Diagnosis not present

## 2015-12-18 DIAGNOSIS — I25110 Atherosclerotic heart disease of native coronary artery with unstable angina pectoris: Secondary | ICD-10-CM | POA: Diagnosis not present

## 2015-12-18 DIAGNOSIS — I481 Persistent atrial fibrillation: Secondary | ICD-10-CM | POA: Diagnosis not present

## 2015-12-18 DIAGNOSIS — I509 Heart failure, unspecified: Secondary | ICD-10-CM | POA: Diagnosis not present

## 2015-12-18 DIAGNOSIS — R091 Pleurisy: Secondary | ICD-10-CM | POA: Diagnosis not present

## 2015-12-18 DIAGNOSIS — I10 Essential (primary) hypertension: Secondary | ICD-10-CM | POA: Diagnosis present

## 2015-12-18 DIAGNOSIS — J948 Other specified pleural conditions: Secondary | ICD-10-CM | POA: Diagnosis not present

## 2015-12-18 HISTORY — DX: Chronic kidney disease, stage 3 unspecified: N18.30

## 2015-12-18 HISTORY — DX: Chronic kidney disease, stage 3 (moderate): N18.3

## 2015-12-18 HISTORY — DX: Type 2 diabetes mellitus without complications: E11.9

## 2015-12-18 HISTORY — DX: Hypertensive heart disease without heart failure: I11.9

## 2015-12-18 HISTORY — DX: Chronic diastolic (congestive) heart failure: I50.32

## 2015-12-18 HISTORY — DX: Personal history of other specified conditions: Z87.898

## 2015-12-18 LAB — COMPREHENSIVE METABOLIC PANEL
ALBUMIN: 2.8 g/dL — AB (ref 3.5–5.0)
ALT: 10 U/L — ABNORMAL LOW (ref 14–54)
ANION GAP: 16 — AB (ref 5–15)
AST: 22 U/L (ref 15–41)
Alkaline Phosphatase: 99 U/L (ref 38–126)
BUN: 21 mg/dL — AB (ref 6–20)
CHLORIDE: 98 mmol/L — AB (ref 101–111)
CO2: 20 mmol/L — ABNORMAL LOW (ref 22–32)
Calcium: 10 mg/dL (ref 8.9–10.3)
Creatinine, Ser: 1.35 mg/dL — ABNORMAL HIGH (ref 0.44–1.00)
GFR calc Af Amer: 41 mL/min — ABNORMAL LOW (ref >60)
GFR calc non Af Amer: 36 mL/min — ABNORMAL LOW (ref >60)
Glucose, Bld: 166 mg/dL — ABNORMAL HIGH (ref 65–99)
Potassium: 3.9 mmol/L (ref 3.5–5.1)
SODIUM: 134 mmol/L — AB (ref 135–145)
Total Bilirubin: 2 mg/dL — ABNORMAL HIGH (ref 0.3–1.2)
Total Protein: 6.7 g/dL (ref 6.5–8.1)

## 2015-12-18 LAB — CBC WITH DIFFERENTIAL/PLATELET
BASOS ABS: 0 10*3/uL (ref 0.0–0.1)
Basophils Relative: 0 %
EOS ABS: 0 10*3/uL (ref 0.0–0.7)
EOS PCT: 0 %
HCT: 39.4 % (ref 36.0–46.0)
Hemoglobin: 12.7 g/dL (ref 12.0–15.0)
Lymphocytes Relative: 5 %
Lymphs Abs: 1.1 10*3/uL (ref 0.7–4.0)
MCH: 28.3 pg (ref 26.0–34.0)
MCHC: 32.2 g/dL (ref 30.0–36.0)
MCV: 87.8 fL (ref 78.0–100.0)
MONO ABS: 1.7 10*3/uL — AB (ref 0.1–1.0)
Monocytes Relative: 8 %
NEUTROS ABS: 19.2 10*3/uL — AB (ref 1.7–7.7)
Neutrophils Relative %: 87 %
PLATELETS: 190 10*3/uL (ref 150–400)
RBC: 4.49 MIL/uL (ref 3.87–5.11)
RDW: 13.6 % (ref 11.5–15.5)
WBC: 22 10*3/uL — ABNORMAL HIGH (ref 4.0–10.5)

## 2015-12-18 LAB — TSH: TSH: 2.077 u[IU]/mL (ref 0.350–4.500)

## 2015-12-18 LAB — I-STAT VENOUS BLOOD GAS, ED
ACID-BASE EXCESS: 2 mmol/L (ref 0.0–2.0)
Bicarbonate: 24.9 mEq/L — ABNORMAL HIGH (ref 20.0–24.0)
O2 SAT: 94 %
PCO2 VEN: 32.6 mmHg — AB (ref 45.0–50.0)
TCO2: 26 mmol/L (ref 0–100)
pH, Ven: 7.491 — ABNORMAL HIGH (ref 7.250–7.300)
pO2, Ven: 65 mmHg — ABNORMAL HIGH (ref 31.0–45.0)

## 2015-12-18 LAB — GLUCOSE, CAPILLARY
GLUCOSE-CAPILLARY: 192 mg/dL — AB (ref 65–99)
GLUCOSE-CAPILLARY: 217 mg/dL — AB (ref 65–99)
Glucose-Capillary: 124 mg/dL — ABNORMAL HIGH (ref 65–99)

## 2015-12-18 LAB — I-STAT CG4 LACTIC ACID, ED: LACTIC ACID, VENOUS: 1.48 mmol/L (ref 0.5–2.0)

## 2015-12-18 LAB — TROPONIN I
Troponin I: 0.98 ng/mL (ref <0.031)
Troponin I: 1.04 ng/mL (ref <0.031)
Troponin I: 1.26 ng/mL (ref <0.031)

## 2015-12-18 LAB — BRAIN NATRIURETIC PEPTIDE: B Natriuretic Peptide: 1232.3 pg/mL — ABNORMAL HIGH (ref 0.0–100.0)

## 2015-12-18 LAB — MAGNESIUM: MAGNESIUM: 2 mg/dL (ref 1.7–2.4)

## 2015-12-18 LAB — HEPARIN LEVEL (UNFRACTIONATED)

## 2015-12-18 MED ORDER — HEPARIN BOLUS VIA INFUSION
3000.0000 [IU] | Freq: Once | INTRAVENOUS | Status: AC
  Administered 2015-12-18: 3000 [IU] via INTRAVENOUS
  Filled 2015-12-18: qty 3000

## 2015-12-18 MED ORDER — DEXTROSE 5 % IV SOLN
5.0000 mg/h | INTRAVENOUS | Status: DC
  Administered 2015-12-18: 5 mg/h via INTRAVENOUS
  Administered 2015-12-18: 10 mg/h via INTRAVENOUS
  Administered 2015-12-19: 12.5 mg/h via INTRAVENOUS
  Filled 2015-12-18 (×2): qty 100

## 2015-12-18 MED ORDER — FUROSEMIDE 80 MG PO TABS
80.0000 mg | ORAL_TABLET | Freq: Every day | ORAL | Status: DC
  Administered 2015-12-18 – 2015-12-20 (×3): 80 mg via ORAL
  Filled 2015-12-18 (×4): qty 1

## 2015-12-18 MED ORDER — METOPROLOL TARTRATE 5 MG/5ML IV SOLN
5.0000 mg | Freq: Once | INTRAVENOUS | Status: AC
  Administered 2015-12-18: 5 mg via INTRAVENOUS
  Filled 2015-12-18: qty 5

## 2015-12-18 MED ORDER — TACROLIMUS 1 MG PO CAPS
2.0000 mg | ORAL_CAPSULE | Freq: Every evening | ORAL | Status: DC
  Administered 2015-12-18: 2 mg via ORAL
  Filled 2015-12-18 (×2): qty 2

## 2015-12-18 MED ORDER — FAMOTIDINE 20 MG PO TABS
20.0000 mg | ORAL_TABLET | Freq: Two times a day (BID) | ORAL | Status: DC
  Administered 2015-12-18 – 2015-12-19 (×3): 20 mg via ORAL
  Filled 2015-12-18 (×3): qty 1

## 2015-12-18 MED ORDER — ACETAMINOPHEN 325 MG PO TABS
650.0000 mg | ORAL_TABLET | ORAL | Status: DC | PRN
Start: 2015-12-18 — End: 2016-01-01
  Administered 2015-12-18 – 2015-12-20 (×3): 650 mg via ORAL
  Filled 2015-12-18 (×3): qty 2

## 2015-12-18 MED ORDER — DEXTROSE 5 % IV SOLN
500.0000 mg | INTRAVENOUS | Status: DC
  Filled 2015-12-18: qty 500

## 2015-12-18 MED ORDER — DEXTROSE 5 % IV SOLN
500.0000 mg | Freq: Once | INTRAVENOUS | Status: AC
  Administered 2015-12-18: 500 mg via INTRAVENOUS
  Filled 2015-12-18: qty 500

## 2015-12-18 MED ORDER — SIMVASTATIN 20 MG PO TABS
20.0000 mg | ORAL_TABLET | Freq: Every day | ORAL | Status: DC
  Administered 2015-12-18: 20 mg via ORAL
  Filled 2015-12-18: qty 1

## 2015-12-18 MED ORDER — ASPIRIN 81 MG PO CHEW
CHEWABLE_TABLET | ORAL | Status: AC
  Filled 2015-12-18: qty 4

## 2015-12-18 MED ORDER — CINACALCET HCL 30 MG PO TABS
30.0000 mg | ORAL_TABLET | Freq: Every day | ORAL | Status: DC
  Filled 2015-12-18 (×2): qty 1

## 2015-12-18 MED ORDER — SODIUM CHLORIDE 0.9 % IV SOLN: 250.0000 mL | INTRAVENOUS | Status: DC | PRN

## 2015-12-18 MED ORDER — SENNOSIDES-DOCUSATE SODIUM 8.6-50 MG PO TABS: 1.0000 | ORAL_TABLET | Freq: Every day | ORAL | Status: DC | PRN

## 2015-12-18 MED ORDER — SODIUM CHLORIDE 0.9% FLUSH
3.0000 mL | Freq: Two times a day (BID) | INTRAVENOUS | Status: DC
  Administered 2015-12-20 – 2016-01-01 (×21): 3 mL via INTRAVENOUS

## 2015-12-18 MED ORDER — MYCOPHENOLATE MOFETIL 250 MG PO CAPS
500.0000 mg | ORAL_CAPSULE | Freq: Two times a day (BID) | ORAL | Status: DC
  Administered 2015-12-18 – 2016-01-01 (×29): 500 mg via ORAL
  Filled 2015-12-18 (×29): qty 2

## 2015-12-18 MED ORDER — NITROGLYCERIN 0.4 MG SL SUBL: 0.4000 mg | SUBLINGUAL_TABLET | SUBLINGUAL | Status: DC | PRN

## 2015-12-18 MED ORDER — SODIUM CHLORIDE 0.9% FLUSH: 3.0000 mL | INTRAVENOUS | Status: DC | PRN

## 2015-12-18 MED ORDER — SODIUM CHLORIDE 0.9 % IV SOLN
INTRAVENOUS | Status: DC
  Administered 2015-12-18 – 2015-12-23 (×2): via INTRAVENOUS
  Administered 2015-12-24: 10 mL/h via INTRAVENOUS

## 2015-12-18 MED ORDER — ASPIRIN 81 MG PO CHEW
324.0000 mg | CHEWABLE_TABLET | Freq: Once | ORAL | Status: AC
  Administered 2015-12-18: 324 mg via ORAL

## 2015-12-18 MED ORDER — ONDANSETRON HCL 4 MG/2ML IJ SOLN: 4.0000 mg | Freq: Four times a day (QID) | INTRAMUSCULAR | Status: DC | PRN

## 2015-12-18 MED ORDER — HEPARIN BOLUS VIA INFUSION
2000.0000 [IU] | Freq: Once | INTRAVENOUS | Status: AC
  Administered 2015-12-18: 2000 [IU] via INTRAVENOUS
  Filled 2015-12-18: qty 2000

## 2015-12-18 MED ORDER — ALLOPURINOL 100 MG PO TABS
100.0000 mg | ORAL_TABLET | Freq: Every day | ORAL | Status: DC
  Administered 2015-12-18 – 2015-12-31 (×14): 100 mg via ORAL
  Filled 2015-12-18 (×14): qty 1

## 2015-12-18 MED ORDER — TACROLIMUS 1 MG PO CAPS
3.0000 mg | ORAL_CAPSULE | Freq: Every morning | ORAL | Status: DC
  Administered 2015-12-19: 3 mg via ORAL
  Filled 2015-12-18 (×2): qty 3

## 2015-12-18 MED ORDER — METOPROLOL TARTRATE 100 MG PO TABS
100.0000 mg | ORAL_TABLET | Freq: Two times a day (BID) | ORAL | Status: DC
  Administered 2015-12-18 – 2015-12-28 (×20): 100 mg via ORAL
  Filled 2015-12-18 (×2): qty 1
  Filled 2015-12-18: qty 2
  Filled 2015-12-18: qty 1
  Filled 2015-12-18 (×5): qty 2
  Filled 2015-12-18: qty 1
  Filled 2015-12-18: qty 2
  Filled 2015-12-18: qty 1
  Filled 2015-12-18: qty 2
  Filled 2015-12-18: qty 1
  Filled 2015-12-18: qty 2
  Filled 2015-12-18 (×2): qty 1
  Filled 2015-12-18: qty 2
  Filled 2015-12-18 (×2): qty 1

## 2015-12-18 MED ORDER — METOPROLOL TARTRATE 5 MG/5ML IV SOLN: 2.5000 mg | Freq: Once | INTRAVENOUS | Status: DC

## 2015-12-18 MED ORDER — ASPIRIN EC 81 MG PO TBEC
81.0000 mg | DELAYED_RELEASE_TABLET | Freq: Every day | ORAL | Status: DC
  Administered 2015-12-19 – 2016-01-01 (×14): 81 mg via ORAL
  Filled 2015-12-18 (×15): qty 1

## 2015-12-18 MED ORDER — DILTIAZEM LOAD VIA INFUSION
10.0000 mg | Freq: Once | INTRAVENOUS | Status: AC
  Administered 2015-12-18: 10 mg via INTRAVENOUS
  Filled 2015-12-18: qty 10

## 2015-12-18 MED ORDER — DEXTROSE 5 % IV SOLN
1.0000 g | INTRAVENOUS | Status: DC
  Filled 2015-12-18: qty 10

## 2015-12-18 MED ORDER — CETYLPYRIDINIUM CHLORIDE 0.05 % MT LIQD
7.0000 mL | Freq: Two times a day (BID) | OROMUCOSAL | Status: DC
  Administered 2015-12-19 – 2016-01-01 (×22): 7 mL via OROMUCOSAL

## 2015-12-18 MED ORDER — DILTIAZEM HCL 100 MG IV SOLR
INTRAVENOUS | Status: AC
  Filled 2015-12-18: qty 100

## 2015-12-18 MED ORDER — INSULIN ASPART 100 UNIT/ML ~~LOC~~ SOLN
0.0000 [IU] | Freq: Three times a day (TID) | SUBCUTANEOUS | Status: DC
  Administered 2015-12-18: 5 [IU] via SUBCUTANEOUS
  Administered 2015-12-19 (×2): 2 [IU] via SUBCUTANEOUS
  Administered 2015-12-20 (×2): 3 [IU] via SUBCUTANEOUS
  Administered 2015-12-21 – 2015-12-23 (×5): 2 [IU] via SUBCUTANEOUS
  Administered 2015-12-24 – 2015-12-25 (×2): 3 [IU] via SUBCUTANEOUS
  Administered 2015-12-26: 2 [IU] via SUBCUTANEOUS
  Administered 2015-12-27 (×2): 3 [IU] via SUBCUTANEOUS
  Administered 2015-12-27 – 2015-12-28 (×2): 2 [IU] via SUBCUTANEOUS
  Administered 2015-12-29: 3 [IU] via SUBCUTANEOUS
  Administered 2015-12-30 – 2015-12-31 (×2): 2 [IU] via SUBCUTANEOUS

## 2015-12-18 MED ORDER — DEXTROSE 5 % IV SOLN
1.0000 g | Freq: Once | INTRAVENOUS | Status: AC
  Administered 2015-12-18: 1 g via INTRAVENOUS
  Filled 2015-12-18: qty 10

## 2015-12-18 MED ORDER — HEPARIN (PORCINE) IN NACL 100-0.45 UNIT/ML-% IJ SOLN
1250.0000 [IU]/h | INTRAMUSCULAR | Status: DC
  Administered 2015-12-18: 850 [IU]/h via INTRAVENOUS
  Administered 2015-12-19: 1300 [IU]/h via INTRAVENOUS
  Administered 2015-12-21 – 2015-12-23 (×3): 1550 [IU]/h via INTRAVENOUS
  Filled 2015-12-18 (×8): qty 250

## 2015-12-18 NOTE — ED Notes (Signed)
Pt arrives from home via [facility] reporting weakness and SOB x 3 days, new CP beginning yesterday.  Pt reports pain as "deep tingling".  EMS reports HR variable wide complex, HR between 150-180.  EMS reports giving 20 mg Cardizem.  Pt denies CP at this time.

## 2015-12-18 NOTE — ED Notes (Signed)
Attempted to call report

## 2015-12-18 NOTE — ED Provider Notes (Signed)
CSN: [id number]     Arrival date & time 12/18/15  4098 History   First MD Initiated Contact with Patient 12/18/15 0818     Chief Complaint  Patient presents with  . Shortness of Breath  . Chest Pain     (Consider location/radiation/quality/duration/timing/severity/associated sxs/prior Treatment) HPI Comments: Patient here complaining of 3 days of worsening shortness of breath with intermittent chest pain. Chest pain lasted all day yesterday and today she went to bed. She woke this morning and continues to note exertional dyspnea with cough and congestion without fever. Also notes increased weakness without recent vomiting or diarrhea or bloody stools. Called EMS and patient was found to be tachycardic with an irregular rhythm between 150-180. She was given 20 mg of Cardizem IV push. She was then transported here.  Patient is a 82 y.o. female presenting with shortness of breath and chest pain. The history is provided by the patient and the EMS personnel.  Shortness of Breath Associated symptoms: chest pain   Chest Pain Associated symptoms: shortness of breath     Past Medical History  Diagnosis Date  . S/P kidney transplant 2003    due to FSGN, follows with Dr. Briant Cedar  . Coronary artery disease   . Hypertension   . Cholelithiasis   . Diastolic CHF (HCC)   . Gout     unconfirmed by joint aspiration  . Chronic kidney disease   . Candidal esophagitis (HCC)   . Gastritis   . Shortness of breath dyspnea   . Diabetes mellitus without complication [facility])    Past Surgical History  Procedure Laterality Date  . Kidney transplant    . Abdominal hysterectomy    . Esophagogastroduodenoscopy N/A 03/20/2014    Procedure: ESOPHAGOGASTRODUODENOSCOPY (EGD);  Surgeon: Theda Belfast, MD;  Location: [facility] ENDOSCOPY;  Service: Endoscopy;  Laterality: N/A;   Family History  Problem Relation Age of Onset  . CAD Father   . CAD Brother    Social History  Substance Use Topics  . Smoking status:  Former Smoker    Quit date: 04/19/1969  . Smokeless tobacco: Never Used  . Alcohol Use: No   OB History    No data available     Review of Systems  Respiratory: Positive for shortness of breath.   Cardiovascular: Positive for chest pain.  All other systems reviewed and are negative.     Allergies  Sulfa drugs cross reactors  Home Medications   Prior to Admission medications   Medication Sig Start Date End Date Taking? Authorizing Provider  allopurinol (ZYLOPRIM) 100 MG tablet Take 100 mg by mouth at bedtime.    Historical Provider, MD  amLODipine (NORVASC) 10 MG tablet Take 5 mg by mouth Daily.  04/28/12   Historical Provider, MD  aspirin EC 81 MG tablet Take 81 mg by mouth daily.    Historical Provider, MD  chlorpheniramine-HYDROcodone (TUSSIONEX) 10-8 MG/5ML LQCR Take 5 mLs by mouth every 12 (twelve) hours as needed for cough. 08/17/14   Courtney Paris, MD  cinacalcet (SENSIPAR) 90 MG tablet Take 90 mg by mouth daily.    Historical Provider, MD  colchicine 0.6 MG tablet Take 0.6 mg by mouth daily as needed. For gout 04/26/12   Almyra Deforest, MD  famotidine (PEPCID) 20 MG tablet Take 20 mg by mouth 2 (two) times daily.    Historical Provider, MD  furosemide (LASIX) 80 MG tablet Take 1 tablet (80 mg total) by mouth daily. 08/17/14   Courtney Paris,  MD  metoprolol (LOPRESSOR) 50 MG tablet Take 100 mg by mouth 2 (two) times daily. 07/11/13   Marrian SalvageJacquelyn S Gill, MD  mycophenolate (CELLCEPT) 250 MG capsule Take 500 mg by mouth 2 (two) times daily.    Historical Provider, MD  pantoprazole (PROTONIX) 20 MG tablet Take 1 tablet (20 mg total) by mouth daily. 03/21/14 08/11/14  Alexa Lucrezia Starch Burns, MD  senna-docusate (SENOKOT-S) 8.6-50 MG per tablet Take 1 tablet by mouth daily as needed for mild constipation. 03/21/14   Servando SnareAlexa R Burns, MD  simvastatin (ZOCOR) 20 MG tablet Take 20 mg by mouth at bedtime.     Historical Provider, MD  tacrolimus (PROGRAF) 0.5 MG capsule Take 1 capsule (0.5 mg total) by mouth  daily. Take 1 capsule by mouth every morning along with one of the 1mg  capsules for a total dose of 1.5mg  in the morning and take 1 capsule by mouth every morning along with one of the 1mg  capsules for a total dose of 1.5mg  at night. Patient taking differently: Take 0.5 mg by mouth daily. Takes 0.5mg  along with 1mg  in morning and evening to equal total dose of 1.5mg  twice daily 03/21/14 04/01/14  Alexa Lucrezia Starch Burns, MD  tacrolimus (PROGRAF) 1 MG capsule Take 1 capsule (1 mg total) by mouth 2 (two) times daily. Take 1 capsule (1 mg total) by mouth 2 (two) times daily. Patient taking differently: Take 1 mg by mouth 2 (two) times daily. Takes 0.5mg  along with 1mg  in morning and evening to equal total dose of 1.5mg  twice daily 03/21/14   Alexa R Lawerance BachBurns, MD   BP 131/92 mmHg  Pulse 100  Temp(Src) 97.7 F (36.5 C) (Oral)  Resp 21  Ht 5\' 3"  (1.6 m)  Wt 73.936 kg  BMI 28.88 kg/m2  SpO2 100% Physical Exam  Constitutional: She is oriented to person, place, and time. She appears well-developed and well-nourished.  Non-toxic appearance. No distress.  HENT:  Head: Normocephalic and atraumatic.  Eyes: Conjunctivae, EOM and lids are normal. Pupils are equal, round, and reactive to light.  Neck: Normal range of motion. Neck supple. No tracheal deviation present. No thyroid mass present.  Cardiovascular: Normal heart sounds.  An irregularly irregular rhythm present. Tachycardia present.  Exam reveals no gallop.   No murmur heard. Pulmonary/Chest: Effort normal. No stridor. No respiratory distress. She has decreased breath sounds in the right upper field and the left upper field. She has no wheezes. She has no rhonchi. She has no rales.  Abdominal: Soft. Normal appearance and bowel sounds are normal. She exhibits no distension. There is no tenderness. There is no rebound and no CVA tenderness.  Musculoskeletal: Normal range of motion. She exhibits no edema or tenderness.  Neurological: She is alert and oriented to  person, place, and time. She has normal strength. No cranial nerve deficit or sensory deficit. GCS eye subscore is 4. GCS verbal subscore is 5. GCS motor subscore is 6.  Skin: Skin is warm and dry. No abrasion and no rash noted.  Psychiatric: She has a normal mood and affect. Her speech is normal and behavior is normal.  Nursing note and vitals reviewed.   ED Course  Procedures (including critical care time) Labs Review Labs Reviewed  CBC WITH DIFFERENTIAL/PLATELET  COMPREHENSIVE METABOLIC PANEL  BRAIN NATRIURETIC PEPTIDE  TROPONIN I    Imaging Review No results found. I have personally reviewed and evaluated these images and lab results as part of my medical decision-making.   EKG Interpretation   Date/Time:  Thursday Dec 18 2015 08:24:14 EDT Ventricular Rate:  155 PR Interval:  112 QRS Duration: 150 QT Interval:  365 QTC Calculation: 586 R Axis:   -17 Text Interpretation:  Extreme tachycardia with wide complex, no further  rhythm analysis attempted changes are new compared to prior Confirmed by  Concha Sudol  MD, Arya Boxley ([id number]) on 12/18/2015 8:33:23 AM      MDM   Final diagnoses:  None    Patient's increased heart rate noted. She had no complaints of chest pain or chest heaviness to me while in the department. Patient's chest x-ray consistent with possible pneumonia and antibiotic started. Marland Kitchen Her EKG does show a new left bundle branch block with compared to prior study 2 years ago. Cardiology consult and has seen the patient. Cardizem drip started and will be admitted for further evaluation.  CRITICAL CARE Performed by: Toy Baker Total critical care time: 50 minutes Critical care time was exclusive of separately billable procedures and treating other patients. Critical care was necessary to treat or prevent imminent or life-threatening deterioration. Critical care was time spent personally by me on the following activities: development of treatment plan with patient  and/or surrogate as well as nursing, discussions with consultants, evaluation of patient's response to treatment, examination of patient, obtaining history from patient or surrogate, ordering and performing treatments and interventions, ordering and review of laboratory studies, ordering and review of radiographic studies, pulse oximetry and re-evaluation of patient's condition.     Lorre Nick, MD 12/18/15 1043

## 2015-12-18 NOTE — Progress Notes (Signed)
ANTICOAGULATION CONSULT NOTE - Follow Up Consult  Pharmacy Consult for Heparin Indication: elevated troponins  Allergies  Allergen Reactions  . Sulfa Drugs Cross Reactors Swelling    Patient Measurements: Height: 5\' 3"  (160 cm) Weight: 163 lb (73.936 kg) IBW/kg (Calculated) : 52.4 Heparin Dosing Weight: 68 kg  Vital Signs: Temp: 97.6 F (36.4 C) (05/18 1600) Temp Source: Oral (05/18 1600) BP: 118/73 mmHg (05/18 1800) Pulse Rate: 105 (05/18 1800)  Labs:  Recent Labs  12/18/15 0830 12/18/15 1330 12/18/15 1822  HGB 12.7  --   --   HCT 39.4  --   --   PLT 190  --   --   HEPARINUNFRC  --   --  <0.10*  CREATININE 1.35*  --   --   TROPONINI 0.98* 1.04*  --     Estimated Creatinine Clearance: 31.5 mL/min (by C-G formula based on Cr of 1.35).    Assessment: Anticoagulation: Elevated troponins. IV heparin started with initial HL <0.1  Goal of Therapy:  Heparin level 0.3-0.7 units/ml Monitor platelets by anticoagulation protocol: Yes   Plan:  Repeat heparin 2000unit IV bolus and increase infusion to 1000 units/hr HL and CBC in AM.   [name], PharmD, BCPS Clinical Staff Pharmacist Pager [phone]  [name], [name] 12/18/2015,8:15 PM

## 2015-12-18 NOTE — H&P (Signed)
History & Physical    Patient ID: [name] MRN: [id number], DOB/AGE: 01/11/1977   Admit date: 12/18/2015   Primary Physician: No primary care provider on file. Primary Cardiologist: Prev seen by Golden Circle, MD, more recently C. Hilty, MD - no outpt f/u.  Patient Profile    82 y/o ? with a h/o CAD, HTN, DM II, and CKD s/p prior renal tx in 2003, who presented to the ED today with a 5 day h/o progressive fatigue, wkns, diaphoresis, and subsequent development of c/p and dyspnea - found to be in [facility].  Past Medical History    Past Medical History  Diagnosis Date  . S/P kidney transplant     a. due to FSGN, follows with Dr. Vivia Birmingham in 2003. Was on HD prior to that.  . Coronary artery disease     a. 01/2001 Cath/PCI: LAD 50p, 42m, D1 50-60, RCA 59m (3.0x13 BX Velocity BMS).  . Hypertensive heart disease   . Cholelithiasis   . Chronic diastolic CHF (congestive heart failure) (HCC)     a. 06/2011 Echo: EF 60-65%, no rwma, Gr1 DD, mild TR.  Marland Kitchen Gout     unconfirmed by joint aspiration  . CKD (chronic kidney disease), stage III     a. in setting of prior ESRD and cadaveric tx in 2003.  . Candidal esophagitis (HCC)     a. 03/2014.  Marland Kitchen Gastritis     a. 03/2014  . Type II diabetes mellitus (HCC)   . History of bacteremia     a. 08/2004 Group A Strep bacteremia.    Past Surgical History  Procedure Laterality Date  . Kidney transplant  2003  . Abdominal hysterectomy    . Esophagogastroduodenoscopy N/A 03/20/2014    Procedure: ESOPHAGOGASTRODUODENOSCOPY (EGD);  Surgeon: Theda Belfast, MD;  Location: [facility] ENDOSCOPY;  Service: Endoscopy;  Laterality: N/A;     Allergies  Allergies  Allergen Reactions  . Sulfa Drugs Cross Reactors Swelling    History of Present Illness    82 y/o ? with a h/o CAD s/p RCA stenting in 2002.  At the time, she had residual, moderate, nonobs LAD and Diag dzs, which was medically managed.  She also has a h/o HTN, DM II, and ESRD - prev on  dialysis but s/p cadaveric renal tx in 2003.  She is followed by Dr. Briant Cedar as an outpt.  She has had several admissions in recent years with diastolic CHF and has been seen by our team but has never f/u as an outpt.  She lives locally and is generally active w/o significant limitations.  She was in her usual state of health until this past Sunday, 5/14, when she began to note significant fatigue and wkns.  She denies fever or chills but over the course of the past few days, she noted intermittent diaphoresis along with progressive weakness.  In that setting, her appetite has been very poor and she she missed her meds for several days but forced herself to take her prograf yesterday.  On 5/17, she noted profound diaphoresis associated with chest pressure and dyspnea.  This was intermittent throughout the day and night, prompting her to present to the [facility] ED this AM.  Here, she was found to be tachycardic in the 140's - 150's with a new LBBB and in Afib.  Trop is elevated @ 0.98, while BNP is up @ 1232 and WBC 22.  She currently denies c/p.  CXR w/ streaky infiltrate - ? PNA.  On further questioning, she says that she has been coughing up some amt of sputum over the past few days.  Home Medications    Prior to Admission medications   Medication Sig Start Date End Date Taking? Authorizing Provider  allopurinol (ZYLOPRIM) 100 MG tablet Take 100 mg by mouth at bedtime.   Yes Historical Provider, MD  colchicine 0.6 MG tablet Take 0.6 mg by mouth daily as needed. For gout 04/26/12  Yes Almyra Deforest, MD  famotidine (PEPCID) 20 MG tablet Take 20 mg by mouth 2 (two) times daily.   Yes Historical Provider, MD  furosemide (LASIX) 80 MG tablet Take 1 tablet (80 mg total) by mouth daily. 08/17/14  Yes Courtney Paris, MD  metoprolol (LOPRESSOR) 50 MG tablet Take 100 mg by mouth 2 (two) times daily. 07/11/13  Yes Marrian Salvage, MD  mycophenolate (CELLCEPT) 250 MG capsule Take 500 mg by mouth 2 (two) times daily.    Yes Historical Provider, MD  senna-docusate (SENOKOT-S) 8.6-50 MG per tablet Take 1 tablet by mouth daily as needed for mild constipation. 03/21/14  Yes Alexa Lucrezia Starch, MD  simvastatin (ZOCOR) 20 MG tablet Take 20 mg by mouth at bedtime.    Yes Historical Provider, MD  tacrolimus (PROGRAF) 1 MG capsule Take 2-3 mg by mouth 2 (two) times daily. 3 mg in the morning and 2 mg at bedtime   Yes Historical Provider, MD  amLODipine (NORVASC) 10 MG tablet Take 5 mg by mouth Daily.  04/28/12   Historical Provider, MD  aspirin EC 81 MG tablet Take 81 mg by mouth daily.    Historical Provider, MD  chlorpheniramine-HYDROcodone (TUSSIONEX) 10-8 MG/5ML LQCR Take 5 mLs by mouth every 12 (twelve) hours as needed for cough. 08/17/14   Courtney Paris, MD  cinacalcet (SENSIPAR) 90 MG tablet Take 30 mg by mouth daily.     Historical Provider, MD  pantoprazole (PROTONIX) 20 MG tablet Take 1 tablet (20 mg total) by mouth daily. 03/21/14 08/11/14  Alexa Lucrezia Starch, MD  tacrolimus (PROGRAF) 0.5 MG capsule Take 1 capsule (0.5 mg total) by mouth daily. Take 1 capsule by mouth every morning along with one of the 1mg  capsules for a total dose of 1.5mg  in the morning and take 1 capsule by mouth every morning along with one of the 1mg  capsules for a total dose of 1.5mg  at night. Patient taking differently: Take 0.5 mg by mouth daily. Takes 0.5mg  along with 1mg  in morning and evening to equal total dose of 1.5mg  twice daily 03/21/14 04/01/14  Alexa Lucrezia Starch, MD  tacrolimus (PROGRAF) 1 MG capsule Take 1 capsule (1 mg total) by mouth 2 (two) times daily. Take 1 capsule (1 mg total) by mouth 2 (two) times daily. Patient taking differently: Take 1 mg by mouth 2 (two) times daily. Takes 0.5mg  along with 1mg  in morning and evening to equal total dose of 1.5mg  twice daily 03/21/14   Alexa Lucrezia Starch, MD    Family History    Family History  Problem Relation Age of Onset  . CAD Father   . CAD Brother   . CAD      Both parents and multiple siblings have  died from coronary disease prior to age 29 and several in their 54's and 53's.    Social History    Social History   Social History  . Marital Status: Divorced    Spouse Name: N/A  . Number of Children: N/A  . Years of Education:  N/A   Occupational History  . Not on file.   Social History Main Topics  . Smoking status: Former Smoker    Quit date: 04/19/1969  . Smokeless tobacco: Never Used  . Alcohol Use: No  . Drug Use: No  . Sexual Activity: Not Currently   Other Topics Concern  . Not on file   Social History Narrative   Patient's husband died in 2010.  Retired - previously delivered Rx medications.  Lives in [place] with grandchild.  Does not routinely exercise.     Review of Systems    General:  +++ wkns/fatigue/diaphoresis.  No chills, fever, or weight changes.  Cardiovascular:  +++ chest pain, +++ dyspnea on exertion, no edema, orthopnea, palpitations, paroxysmal nocturnal dyspnea. Dermatological: No rash, lesions/masses Respiratory: +++ productive cough and dyspnea Urologic: No hematuria, dysuria Abdominal:   No nausea, vomiting, diarrhea, bright red blood per rectum, melena, or hematemesis Neurologic:  No visual changes, wkns, changes in mental status. All other systems reviewed and are otherwise negative except as noted above.  Physical Exam    Blood pressure 120/92, pulse 61, temperature 97.7 F (36.5 C), temperature source Oral, resp. rate 15, height 5\' 3"  (1.6 m), weight 163 lb (73.936 kg), SpO2 99 %.  General: Pleasant, NAD Psych: Normal affect. Neuro: Alert and oriented X 3. Moves all extremities spontaneously. HEENT: Normal  Neck: Supple without bruits or JVD. Lungs:  Resp regular and unlabored, CTA. Heart: IR, IR, tachy, no s3, s4, or murmurs. Abdomen: Soft, non-tender, non-distended, BS + x 4.  Extremities: No clubbing, cyanosis or edema. DP/PT/Radials 2+ and equal bilaterally.  Labs     Recent Labs  12/18/15 0830  TROPONINI 0.98*   Lab  Results  Component Value Date   WBC 22.0* 12/18/2015   HGB 12.7 12/18/2015   HCT 39.4 12/18/2015   MCV 87.8 12/18/2015   PLT 190 12/18/2015    Recent Labs Lab 12/18/15 0830  NA 134*  K 3.9  CL 98*  CO2 20*  BUN 21*  CREATININE 1.35*  CALCIUM 10.0  PROT 6.7  BILITOT 2.0*  ALKPHOS 99  ALT 10*  AST 22  GLUCOSE 166*    Radiology Studies    Dg Chest Port 1 View  12/18/2015  CLINICAL DATA:  Productive cough for 2 weeks, shortness of breath, headache EXAM: PORTABLE CHEST 1 VIEW COMPARISON:  08/11/2014 FINDINGS: Borderline cardiomegaly. No pulmonary edema. There is streaky left base retrocardiac atelectasis or early infiltrate. Atherosclerotic but for sclerotic calcifications of thoracic aorta. IMPRESSION: Streaky left base retrocardiac atelectasis or early infiltrate. No pulmonary edema. Follow-up to resolution recommended. Electronically Signed   By: [name]  Pop [name]   On: 12/18/2015 09:38   ECG & Cardiac Imaging    Afib, RVR, 154, new LBBB.  Assessment & Plan    1.  Afib RVR:  Pt presents with a 5 day h/o progressive fatigue followed by dyspnea, chest pressure, and diaphoresis.  She was found to be in rapid AF upon arrival, though she denies experiencing palpitations.  CHA2DS2VASc = 7.  Add IV dilt and heparin for the time being.  Plan to switch to DOAC but will likely need cath prior to discharge.  Cont home dose of metoprolol.  2.  NSTEMI/Demand ischemia/CAD:  Known prior h/o CAD s/p RCA BMS in 2002 - residual LAD and Diag dzs.  She has not had ischemic eval since and has not followed with cardiology as an outpt either.  She has been having chest pressure and  dyspnea intermittently since yesterday.  ECG notable for new LBBB in setting of AF RVR.  Currently c/p free.  Trop 0.98.  Admit and cycle CE.  Add heparin as above.  Cont asa, statin,  blocker.  Check echo once HR down some.  Will likely require cath prior to d/c pending troponins and LV fxn.  Given h/o renal transplant, will  ask nephrology to be on board.  3.  Hypertensive Heart Disease:  bp currently stable.  Cont  blocker.  Hold home dose of norvasc as she'll be on IV dilt.  4.  HL:  Cont statin.  Check lipids/lft's.  5. CKD III s/p prior renal tx:  Creat stable @ 1.35.  Cont prograf.  Nephrology consult as she may require cath/contrast.  6.  ? PNA:  Cough and intermittent diaphoresis with persistent fatigue since 5/14.  CXR with left base infiltrate - ? Pna. WBC 22K.  Afebrile.  Ordered rocephin/azithromycin in ED.  Will continue.  Can likely transition to oral agent within next 24-48 hrs.  7.  Type II DM:  Apparently diet controlled.  Glucose 166 in ED.  Will add SSI and check A1c.  8.  Chronic diastolic CHF:  BNP up though appears relatively euvolemic.  She has not had her lasix in several days.  Resume home dose of lasix for now.  Will plan to hold pre-cath.  BP stable.  HR up  IV dilt.  Signed, Nicolasa Ducking, NP 12/18/2015, 10:26 AM    I have personally seen and examined this patient with Ward Givens, NP. I agree with the assessment and plan as outlined above. She has known CAD with no recent cardiac follow up. Presenting with cough and pneumonia. Found to have atrial fibrillation, LBBB. The LBBB is new. She has had on/off chest pain but none currently. Exam with thin female in NAD, lungs clear, CV: irreg irreg, no LE edema. Labs reviewed. Troponin mildly elevated, likely due to demand ischemia in setting of infection and atrial fib with RVR. EKG reviewed by me. Will admit to tele. IV Heparin. IV Cardizem for rate control. Echo. Continue antibiotics. Cycle troponin. May need cath before discharge but would like to rate control her and treat her pneumonia first.   Verne Carrow 12/18/2015 11:04 AM

## 2015-12-18 NOTE — Progress Notes (Signed)
Pharmacy Antibiotic Note  [name] is a 82 y.o. female admitted on 12/18/2015 with pneumonia.  Pharmacy has been consulted for ceftriaxone and azithromycin dosing.  Day #1 of abx for CAP. 1 time doses ordered in the ED of ceftriaxone and azithromycin. Afebrile, WBC elevated at 22.  Plan: Continue ceftriaxone 1g IV Q24 Continue azithromycin 500mg  IV Q24 Monitor clinical picture F/U C&S, abx deescalation / LOT  Consider 5-7 day course and transition to PO abx as able Rx will sign off as no renal adjustments necessary for abx   Height: 5\' 3"  (160 cm) Weight: 163 lb (73.936 kg) IBW/kg (Calculated) : 52.4  Temp (24hrs), Avg:97.7 F (36.5 C), Min:97.7 F (36.5 C), Max:97.7 F (36.5 C)   Recent Labs Lab 12/18/15 0830  WBC 22.0*  CREATININE 1.35*    Estimated Creatinine Clearance: 31.5 mL/min (by C-G formula based on Cr of 1.35).    Allergies  Allergen Reactions  . Sulfa Drugs Cross Reactors Swelling    Antimicrobials this admission: Ceftriaxone 5/18 >>  Azithromycin 5/18 >>   Dose adjustments this admission: n/a  Microbiology results: n/a  Thank you for allowing pharmacy to be a part of this patient's care.  [name], PharmD, BCPS Clinical Pharmacist Pager [phone] 12/18/2015 9:47 AM

## 2015-12-18 NOTE — ED Notes (Signed)
CULTURES COLLECTED BEFORE ROCEPHIN FINISHED!!!!!!!

## 2015-12-18 NOTE — Progress Notes (Signed)
ANTICOAGULATION CONSULT NOTE - Initial Consult  Pharmacy Consult for Heparin Indication: chest pain/ACS  Allergies  Allergen Reactions  . Sulfa Drugs Cross Reactors Swelling    Patient Measurements: Height: 5\' 3"  (160 cm) Weight: 163 lb (73.936 kg) IBW/kg (Calculated) : 52.4 Heparin Dosing Weight: 68 kg  Vital Signs: Temp: 97.7 F (36.5 C) (05/18 0831) Temp Source: Oral (05/18 0831) BP: 120/92 mmHg (05/18 1000) Pulse Rate: 61 (05/18 1000)  Labs:  Recent Labs  12/18/15 0830  HGB 12.7  HCT 39.4  PLT 190  CREATININE 1.35*  TROPONINI 0.98*    Estimated Creatinine Clearance: 31.5 mL/min (by C-G formula based on Cr of 1.35).   Medical History: Past Medical History  Diagnosis Date  . S/P kidney transplant 2003    due to FSGN, follows with Dr. Briant CedarMattingly  . Coronary artery disease     a. 01/2001 Cath/PCI: LAD 50p, 5012m, D1 50-60, RCA 7551m (3.0x13 BX Velocity BMS).  . Hypertension   . Cholelithiasis   . Diastolic CHF (HCC)   . Gout     unconfirmed by joint aspiration  . Chronic kidney disease   . Candidal esophagitis (HCC)   . Gastritis   . Shortness of breath dyspnea   . Diabetes mellitus without complication [facility](HCC)     Assessment: 82 yo F presents on 5/18 with SOB x 3 days and CP. Troponin found to be elevated at 0.98. Pharmacy consulted to start heparin. CBC stable. No s/s of bleed.  Goal of Therapy:  Heparin level 0.3-0.7 units/ml Monitor platelets by anticoagulation protocol: Yes   Plan:  Give 3,000 unit heparin bolus Start heparin gtt at 850 units/hr Check 8 hr HL Monitor daily HL, CBC, s/s of bleed

## 2015-12-18 NOTE — ED Notes (Signed)
MD at bedside. 

## 2015-12-18 NOTE — ED Notes (Signed)
Pt reports CP "heaviness" rating 5/10.  Dr. Freida BusmanAllen made aware.

## 2015-12-18 NOTE — ED Notes (Signed)
Troponin 0.98 Dr Freida BusmanAllen made aware

## 2015-12-19 ENCOUNTER — Inpatient Hospital Stay (HOSPITAL_COMMUNITY): Payer: Medicare Other

## 2015-12-19 DIAGNOSIS — I214 Non-ST elevation (NSTEMI) myocardial infarction: Secondary | ICD-10-CM

## 2015-12-19 DIAGNOSIS — I4891 Unspecified atrial fibrillation: Secondary | ICD-10-CM

## 2015-12-19 LAB — BLOOD CULTURE ID PANEL (REFLEXED)
Acinetobacter baumannii: NOT DETECTED
CANDIDA ALBICANS: NOT DETECTED
CANDIDA GLABRATA: NOT DETECTED
CANDIDA KRUSEI: NOT DETECTED
CANDIDA PARAPSILOSIS: NOT DETECTED
CANDIDA TROPICALIS: NOT DETECTED
Carbapenem resistance: NOT DETECTED
ENTEROBACTER CLOACAE COMPLEX: NOT DETECTED
ESCHERICHIA COLI: NOT DETECTED
Enterobacteriaceae species: NOT DETECTED
Enterococcus species: NOT DETECTED
Haemophilus influenzae: NOT DETECTED
KLEBSIELLA PNEUMONIAE: NOT DETECTED
Klebsiella oxytoca: NOT DETECTED
Listeria monocytogenes: NOT DETECTED
METHICILLIN RESISTANCE: NOT DETECTED
Neisseria meningitidis: NOT DETECTED
PROTEUS SPECIES: NOT DETECTED
Pseudomonas aeruginosa: NOT DETECTED
SERRATIA MARCESCENS: NOT DETECTED
STREPTOCOCCUS PNEUMONIAE: DETECTED — AB
Staphylococcus aureus (BCID): NOT DETECTED
Staphylococcus species: NOT DETECTED
Streptococcus agalactiae: NOT DETECTED
Streptococcus pyogenes: NOT DETECTED
Streptococcus species: NOT DETECTED
Vancomycin resistance: NOT DETECTED

## 2015-12-19 LAB — LIPID PANEL
CHOL/HDL RATIO: 3 ratio
CHOLESTEROL: 101 mg/dL (ref 0–200)
HDL: 34 mg/dL — ABNORMAL LOW (ref >40)
LDL CALC: 50 mg/dL (ref 0–99)
TRIGLYCERIDES: 83 mg/dL (ref <150)
VLDL: 17 mg/dL (ref 0–40)

## 2015-12-19 LAB — CBC
HEMATOCRIT: 35.2 % — AB (ref 36.0–46.0)
HEMOGLOBIN: 11.3 g/dL — AB (ref 12.0–15.0)
MCH: 28.2 pg (ref 26.0–34.0)
MCHC: 32.1 g/dL (ref 30.0–36.0)
MCV: 87.8 fL (ref 78.0–100.0)
Platelets: 216 10*3/uL (ref 150–400)
RBC: 4.01 MIL/uL (ref 3.87–5.11)
RDW: 13.6 % (ref 11.5–15.5)
WBC: 16 10*3/uL — AB (ref 4.0–10.5)

## 2015-12-19 LAB — GLUCOSE, CAPILLARY
GLUCOSE-CAPILLARY: 132 mg/dL — AB (ref 65–99)
Glucose-Capillary: 115 mg/dL — ABNORMAL HIGH (ref 65–99)
Glucose-Capillary: 122 mg/dL — ABNORMAL HIGH (ref 65–99)

## 2015-12-19 LAB — BASIC METABOLIC PANEL
ANION GAP: 14 (ref 5–15)
BUN: 28 mg/dL — AB (ref 6–20)
CHLORIDE: 96 mmol/L — AB (ref 101–111)
CO2: 24 mmol/L (ref 22–32)
Calcium: 10 mg/dL (ref 8.9–10.3)
Creatinine, Ser: 1.51 mg/dL — ABNORMAL HIGH (ref 0.44–1.00)
GFR calc non Af Amer: 31 mL/min — ABNORMAL LOW (ref >60)
GFR, EST AFRICAN AMERICAN: 36 mL/min — AB (ref >60)
Glucose, Bld: 132 mg/dL — ABNORMAL HIGH (ref 65–99)
POTASSIUM: 3.8 mmol/L (ref 3.5–5.1)
Sodium: 134 mmol/L — ABNORMAL LOW (ref 135–145)

## 2015-12-19 LAB — HEPARIN LEVEL (UNFRACTIONATED)
HEPARIN UNFRACTIONATED: 0.29 [IU]/mL — AB (ref 0.30–0.70)
Heparin Unfractionated: 0.43 IU/mL (ref 0.30–0.70)

## 2015-12-19 LAB — MRSA PCR SCREENING: MRSA by PCR: POSITIVE — AB

## 2015-12-19 LAB — ECHOCARDIOGRAM COMPLETE
Height: 63 in
Weight: 2585.55 oz

## 2015-12-19 LAB — TROPONIN I: Troponin I: 0.93 ng/mL (ref <0.031)

## 2015-12-19 MED ORDER — TACROLIMUS 1 MG PO CAPS
2.0000 mg | ORAL_CAPSULE | Freq: Every day | ORAL | Status: DC
  Administered 2015-12-19 – 2015-12-22 (×4): 2 mg via ORAL
  Filled 2015-12-19 (×4): qty 2

## 2015-12-19 MED ORDER — HEPARIN BOLUS VIA INFUSION
2000.0000 [IU] | Freq: Once | INTRAVENOUS | Status: AC
  Administered 2015-12-19: 2000 [IU] via INTRAVENOUS
  Filled 2015-12-19: qty 2000

## 2015-12-19 MED ORDER — TACROLIMUS 1 MG PO CAPS
2.0000 mg | ORAL_CAPSULE | Freq: Every morning | ORAL | Status: DC
  Administered 2015-12-20 – 2015-12-23 (×4): 2 mg via ORAL
  Filled 2015-12-19 (×4): qty 2

## 2015-12-19 MED ORDER — FAMOTIDINE 20 MG PO TABS
20.0000 mg | ORAL_TABLET | Freq: Every day | ORAL | Status: DC
  Administered 2015-12-19 – 2015-12-23 (×5): 20 mg via ORAL
  Filled 2015-12-19 (×5): qty 1

## 2015-12-19 MED ORDER — DEXTROSE 5 % IV SOLN
2.0000 g | INTRAVENOUS | Status: AC
  Administered 2015-12-20 – 2015-12-27 (×8): 2 g via INTRAVENOUS
  Filled 2015-12-19 (×9): qty 2

## 2015-12-19 MED ORDER — GUAIFENESIN 200 MG PO TABS
200.0000 mg | ORAL_TABLET | Freq: Four times a day (QID) | ORAL | Status: DC
  Administered 2015-12-19 – 2015-12-20 (×4): 200 mg via ORAL
  Filled 2015-12-19 (×6): qty 1

## 2015-12-19 MED ORDER — CHLORHEXIDINE GLUCONATE CLOTH 2 % EX PADS
6.0000 | MEDICATED_PAD | Freq: Every day | CUTANEOUS | Status: AC
  Administered 2015-12-19 – 2015-12-23 (×5): 6 via TOPICAL

## 2015-12-19 MED ORDER — DEXTROSE 5 % IV SOLN
2.0000 g | Freq: Every day | INTRAVENOUS | Status: DC
  Administered 2015-12-19: 2 g via INTRAVENOUS
  Filled 2015-12-19: qty 2

## 2015-12-19 MED ORDER — ATORVASTATIN CALCIUM 10 MG PO TABS
10.0000 mg | ORAL_TABLET | Freq: Every day | ORAL | Status: DC
  Administered 2015-12-19 – 2015-12-31 (×13): 10 mg via ORAL
  Filled 2015-12-19 (×14): qty 1

## 2015-12-19 MED ORDER — AMOXICILLIN-POT CLAVULANATE 500-125 MG PO TABS: 1.0000 | ORAL_TABLET | Freq: Two times a day (BID) | ORAL | Status: DC

## 2015-12-19 MED ORDER — DEXTROSE 5 % IV SOLN
2.0000 g | INTRAVENOUS | Status: DC
  Filled 2015-12-19: qty 2

## 2015-12-19 MED ORDER — MUPIROCIN 2 % EX OINT
1.0000 "application " | TOPICAL_OINTMENT | Freq: Two times a day (BID) | CUTANEOUS | Status: AC
  Administered 2015-12-19 – 2015-12-23 (×10): 1 via NASAL
  Filled 2015-12-19 (×3): qty 22

## 2015-12-19 MED ORDER — DILTIAZEM HCL ER COATED BEADS 240 MG PO CP24
240.0000 mg | ORAL_CAPSULE | Freq: Every day | ORAL | Status: DC
  Administered 2015-12-19 – 2016-01-01 (×13): 240 mg via ORAL
  Filled 2015-12-19 (×15): qty 1

## 2015-12-19 MED ORDER — DILTIAZEM HCL 100 MG IV SOLR
5.0000 mg/h | INTRAVENOUS | Status: DC
  Administered 2015-12-19: 5 mg/h via INTRAVENOUS
  Administered 2015-12-20: 12.5 mg/h via INTRAVENOUS
  Filled 2015-12-19 (×3): qty 100

## 2015-12-19 NOTE — Progress Notes (Signed)
ANTICOAGULATION CONSULT NOTE - Follow Up Consult  Pharmacy Consult for Heparin Indication: elevated troponins  Allergies  Allergen Reactions  . Sulfa Drugs Cross Reactors Swelling    Patient Measurements: Height: 5\' 3"  (160 cm) Weight: 161 lb 9.6 oz (73.3 kg) IBW/kg (Calculated) : 52.4 Heparin Dosing Weight: 68 kg  Vital Signs: Temp: 97.3 F (36.3 C) (05/19 1232) Temp Source: Oral (05/19 1232) BP: 100/57 mmHg (05/19 1400) Pulse Rate: 87 (05/19 1400)  Labs:  Recent Labs  12/18/15 0830 12/18/15 1330 12/18/15 1822 12/19/15 0124 12/19/15 0242 12/19/15 1328  HGB 12.7  --   --   --  11.3*  --   HCT 39.4  --   --   --  35.2*  --   PLT 190  --   --   --  216  --   HEPARINUNFRC  --   --  <0.10*  --  <0.10* 0.43  CREATININE 1.35*  --   --   --  1.51*  --   TROPONINI 0.98* 1.04* 1.26* 0.93*  --   --     Estimated Creatinine Clearance: 28 mL/min (by C-G formula based on Cr of 1.51).    Assessment: 82 y.o. F on heparin for r/o ACS. Heparin level is therapeutic at 1300 units/hr. Hgb down a bit, plt ok.   Goal of Therapy:  Heparin level 0.3-0.7 units/ml Monitor platelets by anticoagulation protocol: Yes   Plan:  Continue heparin 1300 units/hr F/u 8 hr heparin level Monitor for s/sx bleeding  Greggory Stallionristy Reyes, PharmD Clinical Pharmacy Resident Pager # [phone] 12/19/2015 2:34 PM

## 2015-12-19 NOTE — Progress Notes (Signed)
PHARMACY - PHYSICIAN COMMUNICATION CRITICAL VALUE ALERT - BLOOD CULTURE IDENTIFICATION (BCID)  Results for orders placed or performed during the hospital encounter of 12/18/15  Blood Culture ID Panel (Reflexed) (Collected: 12/18/2015 10:49 AM)  Result Value Ref Range   Enterococcus species NOT DETECTED NOT DETECTED   Vancomycin resistance NOT DETECTED NOT DETECTED   Listeria monocytogenes NOT DETECTED NOT DETECTED   Staphylococcus species NOT DETECTED NOT DETECTED   Staphylococcus aureus NOT DETECTED NOT DETECTED   Methicillin resistance NOT DETECTED NOT DETECTED   Streptococcus species NOT DETECTED NOT DETECTED   Streptococcus agalactiae NOT DETECTED NOT DETECTED   Streptococcus pneumoniae DETECTED (A) NOT DETECTED   Streptococcus pyogenes NOT DETECTED NOT DETECTED   Acinetobacter baumannii NOT DETECTED NOT DETECTED   Enterobacteriaceae species NOT DETECTED NOT DETECTED   Enterobacter cloacae complex NOT DETECTED NOT DETECTED   Escherichia coli NOT DETECTED NOT DETECTED   Klebsiella oxytoca NOT DETECTED NOT DETECTED   Klebsiella pneumoniae NOT DETECTED NOT DETECTED   Proteus species NOT DETECTED NOT DETECTED   Serratia marcescens NOT DETECTED NOT DETECTED   Carbapenem resistance NOT DETECTED NOT DETECTED   Haemophilus influenzae NOT DETECTED NOT DETECTED   Neisseria meningitidis NOT DETECTED NOT DETECTED   Pseudomonas aeruginosa NOT DETECTED NOT DETECTED   Candida albicans NOT DETECTED NOT DETECTED   Candida glabrata NOT DETECTED NOT DETECTED   Candida krusei NOT DETECTED NOT DETECTED   Candida parapsilosis NOT DETECTED NOT DETECTED   Candida tropicalis NOT DETECTED NOT DETECTED    Name of physician (or Provider) Contacted: Dr. Shirlee LatchMcLean  Changes to prescribed antibiotics required: Change Rocephin dose to 2gm IV q24h with bacteremia (previously on 1gm IV q24h for CAP)  Christoper Fabianaron Javed Cotto, PharmD, BCPS Clinical pharmacist, pager [phone] 12/19/2015  4:19 AM

## 2015-12-19 NOTE — Progress Notes (Signed)
ANTICOAGULATION CONSULT NOTE - Follow Up Consult  Pharmacy Consult for Heparin Indication: elevated troponins  Allergies  Allergen Reactions  . Sulfa Drugs Cross Reactors Swelling    Patient Measurements: Height: 5\' 3"  (160 cm) Weight: 163 lb (73.936 kg) IBW/kg (Calculated) : 52.4 Heparin Dosing Weight: 68 kg  Vital Signs: Temp: 98 F (36.7 C) (05/19 0000) Temp Source: Oral (05/19 0000) BP: 111/62 mmHg (05/19 0200) Pulse Rate: 100 (05/19 0200)  Labs:  Recent Labs  12/18/15 0830 12/18/15 1330 12/18/15 1822 12/19/15 0124 12/19/15 0242  HGB 12.7  --   --   --  11.3*  HCT 39.4  --   --   --  35.2*  PLT 190  --   --   --  216  HEPARINUNFRC  --   --  <0.10*  --  <0.10*  CREATININE 1.35*  --   --   --  1.51*  TROPONINI 0.98* 1.04* 1.26* 0.93*  --     Estimated Creatinine Clearance: 28.1 mL/min (by C-G formula based on Cr of 1.51).    Assessment: 82 y.o. F on heparin for r/o ACS. Heparin level remains undetectable on 1000 units/hr. Hgb down a bit, plt ok. No issues with line or bleeding reported per RN.  Goal of Therapy:  Heparin level 0.3-0.7 units/ml Monitor platelets by anticoagulation protocol: Yes   Plan:  Rebolus heparin 2000 units and increase infusion to 1300 units/hr. F/u 8hr heparin level  Christoper Fabianaron Iman Reinertsen, PharmD, BCPS Clinical pharmacist, pager [phone]  12/19/2015,5:39 AM

## 2015-12-19 NOTE — Progress Notes (Signed)
Diltiazem switched from IV gtt to PO capsule today. In interim period, HR elevated and sustained between 125-145.  MD notified, verbal order to restart Diltiazem gtt at 5 mg/hr and titrate up if neccessary.  Will monitor HR/rhythm.

## 2015-12-19 NOTE — Consult Note (Signed)
[name] Admit Date: 12/18/2015 12/19/2015 [name] Requesting Physician:  Clifton James MD  Reason for Consult:  Comanagement of CKD and Kidney Transplant HPI:  82 year old female admitted on 12/18/15 for comanagement of CKD and transplant medications. Patient has a history of kidney transplant in 2003 at [facility]. 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 She follows with Dr. Briant Cedar in our office, last seen in February of this year. Immunosuppression is mycophenolate mofetil 500 mg twice daily and tacrolimus 3 mg the morning and 2 mg in the evening. Baseline creatinine is 1.0-1.4. No recent troubles with her kidney function.  She was admitted with fatigue, angina, dyspnea and found to have a wide complex tachycardia which was found to be a left bundle branch block with new onset atrial fibrillation with RVR. She has been placed on rate control with beta blockers and diltiazem. She is anticoagulated with heparin. She had a positive troponin with concern for an NSTEMI. She has known coronary disease. She also is being treated for a community-acquired pneumonia with ceftriaxone.  She has continued on outpatient dosing of tacrolimus. No confusion, tremor. Renal function has been stable.  CREATININE, SER (mg/dL)  Date Value  91/47/8295 1.51*  12/18/2015 1.35*  08/17/2014 1.35*  08/16/2014 1.57*  08/15/2014 1.78*  08/14/2014 2.24*  08/13/2014 2.16*  08/12/2014 1.43*  08/11/2014 1.41*  03/20/2014 0.95  ] I/Os: I/O last 3 completed shifts: In: 625.3 [P.O.:100; I.V.:475.3; IV Piggyback:50] Out: 225 [Urine:225]  ROS Balance of 12 systems is negative w/ exceptions as above  PMH  Past Medical History  Diagnosis Date  . S/P kidney transplant     a. due to FSGN, follows with Dr. Vivia Birmingham in 2003. Was on HD prior to that.  . Coronary artery disease     a. 01/2001 Cath/PCI: LAD 50p, 5m, D1 50-60, RCA 62m (3.0x13 BX Velocity BMS).  . Hypertensive heart disease   .  Cholelithiasis   . Chronic diastolic CHF (congestive heart failure) (HCC)     a. 06/2011 Echo: EF 60-65%, no rwma, Gr1 DD, mild TR.  Marland Kitchen Gout     unconfirmed by joint aspiration  . CKD (chronic kidney disease), stage III     a. in setting of prior ESRD and cadaveric tx in 2003.  . Candidal esophagitis (HCC)     a. 03/2014.  Marland Kitchen Gastritis     a. 03/2014  . Type II diabetes mellitus (HCC)   . History of bacteremia     a. 08/2004 Group A Strep bacteremia.   PSH  Past Surgical History  Procedure Laterality Date  . Kidney transplant  2003  . Abdominal hysterectomy    . Esophagogastroduodenoscopy N/A 03/20/2014    Procedure: ESOPHAGOGASTRODUODENOSCOPY (EGD);  Surgeon: Theda Belfast, MD;  Location: [facility] ENDOSCOPY;  Service: Endoscopy;  Laterality: N/A;   FH  Family History  Problem Relation Age of Onset  . CAD Father   . CAD Brother   . CAD      Both parents and multiple siblings have died from coronary disease prior to age 53 and several in their 81's and 54's.   SH  reports that she quit smoking about 46 years ago. She has never used smokeless tobacco. She reports that she does not drink alcohol or use illicit drugs. Allergies  Allergies  Allergen Reactions  . Sulfa Drugs Cross Reactors Swelling   Home medications Prior to Admission medications   Medication Sig Start Date End Date Taking? Authorizing Provider  allopurinol (ZYLOPRIM) 100 MG tablet  Take 100 mg by mouth at bedtime.   Yes Historical Provider, MD  colchicine 0.6 MG tablet Take 0.6 mg by mouth daily as needed. For gout 04/26/12  Yes Almyra Deforest, MD  famotidine (PEPCID) 20 MG tablet Take 20 mg by mouth 2 (two) times daily.   Yes Historical Provider, MD  furosemide (LASIX) 80 MG tablet Take 1 tablet (80 mg total) by mouth daily. 08/17/14  Yes Courtney Paris, MD  metoprolol (LOPRESSOR) 50 MG tablet Take 100 mg by mouth 2 (two) times daily. 07/11/13  Yes Marrian Salvage, MD  mycophenolate (CELLCEPT) 250 MG capsule Take 500 mg by  mouth 2 (two) times daily.   Yes Historical Provider, MD  senna-docusate (SENOKOT-S) 8.6-50 MG per tablet Take 1 tablet by mouth daily as needed for mild constipation. 03/21/14  Yes Alexa Lucrezia Starch, MD  simvastatin (ZOCOR) 20 MG tablet Take 20 mg by mouth at bedtime.    Yes Historical Provider, MD  tacrolimus (PROGRAF) 1 MG capsule Take 2-3 mg by mouth 2 (two) times daily. 3 mg in the morning and 2 mg at bedtime   Yes Historical Provider, MD  amLODipine (NORVASC) 10 MG tablet Take 5 mg by mouth Daily.  04/28/12   Historical Provider, MD  aspirin EC 81 MG tablet Take 81 mg by mouth daily.    Historical Provider, MD  chlorpheniramine-HYDROcodone (TUSSIONEX) 10-8 MG/5ML LQCR Take 5 mLs by mouth every 12 (twelve) hours as needed for cough. 08/17/14   Courtney Paris, MD  cinacalcet (SENSIPAR) 90 MG tablet Take 30 mg by mouth daily.     Historical Provider, MD  pantoprazole (PROTONIX) 20 MG tablet Take 1 tablet (20 mg total) by mouth daily. 03/21/14 08/11/14  Alexa Lucrezia Starch, MD  tacrolimus (PROGRAF) 0.5 MG capsule Take 1 capsule (0.5 mg total) by mouth daily. Take 1 capsule by mouth every morning along with one of the  capsules for a total dose of 1.5mg  in the morning and take 1 capsule by mouth every morning along with one of the  capsules for a total dose of 1.5mg  at night. Patient taking differently: Take 0.5 mg by mouth daily. Takes 0.5mg  along with  in morning and evening to equal total dose of 1.5mg  twice daily 03/21/14 04/01/14  Alexa Lucrezia Starch, MD    Current Medications Scheduled Meds: . allopurinol  100 mg Oral QHS  . antiseptic oral rinse  7 mL Mouth Rinse BID  . aspirin EC  81 mg Oral Daily  . atorvastatin  10 mg Oral q1800  . [START ON 12/20/2015] cefTRIAXone (ROCEPHIN)  IV  2 g Intravenous Q24H  . Chlorhexidine Gluconate Cloth  6 each Topical Q0600  . cinacalcet  30 mg Oral Q breakfast  . diltiazem  240 mg Oral Daily  . famotidine  20 mg Oral BID  . furosemide  80 mg Oral Daily  .  guaiFENesin  200 mg Oral Q6H  . insulin aspart  0-15 Units Subcutaneous TID WC  . metoprolol  100 mg Oral BID  . mupirocin ointment  1 application Nasal BID  . mycophenolate  500 mg Oral BID  . sodium chloride flush  3 mL Intravenous Q12H  . tacrolimus  2 mg Oral QHS  . [START ON 12/20/2015] tacrolimus  2 mg Oral q morning - 10a   Continuous Infusions: . sodium chloride 20 mL/hr at 12/19/15 1400  . diltiazem (CARDIZEM) infusion 5 mg/hr (12/19/15 1400)  . heparin 1,300 Units/hr (12/19/15 1400)  PRN Meds:.sodium chloride, acetaminophen, nitroGLYCERIN, ondansetron (ZOFRAN) IV, senna-docusate, sodium chloride flush  CBC  Recent Labs Lab 12/18/15 0830 12/19/15 0242  WBC 22.0* 16.0*  NEUTROABS 19.2*  --   HGB 12.7 11.3*  HCT 39.4 35.2*  MCV 87.8 87.8  PLT 190 216   Basic Metabolic Panel  Recent Labs Lab 12/18/15 0830 12/19/15 0242  NA 134* 134*  K 3.9 3.8  CL 98* 96*  CO2 20* 24  GLUCOSE 166* 132*  BUN 21* 28*  CREATININE 1.35* 1.51*  CALCIUM 10.0 10.0    Physical Exam  Blood pressure 100/57, pulse 87, temperature 97.3 F (36.3 C), temperature source Oral, resp. rate 26, height 5\' 3"  (1.6 m), weight 73.3 kg (161 lb 9.6 oz), SpO2 97 %. GEN: NAD ENT: NCAT EYES: EOMI CV: IR, normal rate, no rub, nl s1s2 PULM: ctab, nl wob ABD: s/nt/nd SKIN: no rashes/lesions EXT:No LEE   Assessment 66F admit with AFib with RVR new onset, NSTEMI, hx/o KT 2003 on Tac/MMF.  Rate control with dilitiazem  1. S/p Kid Tplt 2003 on Tac/MMF, BL SCr 1.0-1.4, [facility] 2. AFib with RVR, new onsent 3. NSTEMI, known CAD 4. CAP on ceftriaxone 5. HTN 6. HLD 7. Chronic dCHF  Plan 1. With the initiation of diltiazem which is an inhibitor of tacrolimus metabolism I am concerned that she might develop increased tacrolimus levels and potentially acute calcineurin toxicity.   2. Reduce tacrolimus dosing to 2 mg twice daily 3. Check tacrolimus levels daily, hold morning dose until  checked 4. Continue mycophenolate at current dosing 5. Will closely follow   Sabra Heckyan Jens Siems MD [phone] pgr 12/19/2015, 2:51 PM

## 2015-12-19 NOTE — Progress Notes (Signed)
  Echocardiogram 2D Echocardiogram has been performed.  Delcie RochENNINGTON, Treacy Holcomb 12/19/2015, 9:35 AM

## 2015-12-19 NOTE — Progress Notes (Addendum)
SUBJECTIVE: Breathing is better. Still has a cough.   Tele:atrial fib  BP 111/59 mmHg  Pulse 87  Temp(Src) 97.7 F (36.5 C) (Oral)  Resp 24  Ht  (1.6 m)  Wt 161 lb 9.6 oz (73.3 kg)  BMI 28.63 kg/m2  SpO2 96%  Intake/Output Summary (Last 24 hours) at 12/19/15 0801 Last data filed at 12/19/15 0700  Gross per 24 hour  Intake 625.33 ml  Output    225 ml  Net 400.33 ml    PHYSICAL EXAM General: Well developed, well nourished, in no acute distress. Alert and oriented x 3.  Psych:  Good affect, responds appropriately Neck: No JVD. No masses noted.  Lungs: Clear bilaterally with no wheezes or rhonci noted.  Heart: irreg irreg with no murmurs noted. Abdomen: Bowel sounds are present. Soft, non-tender.  Extremities: No lower extremity edema.   LABS: Basic Metabolic Panel:  Recent Labs  16/10/96 0830 12/18/15 1330 12/19/15 0242  NA 134*  --  134*  K 3.9  --  3.8  CL 98*  --  96*  CO2 20*  --  24  GLUCOSE 166*  --  132*  BUN 21*  --  28*  CREATININE 1.35*  --  1.51*  CALCIUM 10.0  --  10.0  MG  --  2.0  --    CBC:  Recent Labs  12/18/15 0830 12/19/15 0242  WBC 22.0* 16.0*  NEUTROABS 19.2*  --   HGB 12.7 11.3*  HCT 39.4 35.2*  MCV 87.8 87.8  PLT 190 216   Cardiac Enzymes:  Recent Labs  12/18/15 1330 12/18/15 1822 12/19/15 0124  TROPONINI 1.04* 1.26* 0.93*   Fasting Lipid Panel:  Recent Labs  12/19/15 0242  CHOL 101  HDL 34*  LDLCALC 50  TRIG 83  CHOLHDL 3.0    Current Meds: . allopurinol  100 mg Oral QHS  . antiseptic oral rinse  7 mL Mouth Rinse BID  . aspirin EC  81 mg Oral Daily  . azithromycin  500 mg Intravenous Q24H  . cefTRIAXone (ROCEPHIN)  IV  2 g Intravenous Q0600  . Chlorhexidine Gluconate Cloth  6 each Topical Q0600  . cinacalcet  30 mg Oral Q breakfast  . famotidine  20 mg Oral BID  . furosemide  80 mg Oral Daily  . insulin aspart  0-15 Units Subcutaneous TID WC  . metoprolol  100 mg Oral BID  . mupirocin  ointment  1 application Nasal BID  . mycophenolate  500 mg Oral BID  . simvastatin  20 mg Oral QHS  . sodium chloride flush  3 mL Intravenous Q12H  . tacrolimus  2 mg Oral QPM  . tacrolimus  3 mg Oral q morning - 10a     ASSESSMENT AND PLAN: 82 yo female with known CAD, DM, HTN, CKD s/p renal transplant admitted 12/18/15 with cough, fatigue, chest pain and found to have pneumonia and atrial fib with RVR, elevated troponin.   1. Atrial fibrillation with RVR: Pt presents with a 5 day h/o progressive fatigue followed by dyspnea, chest pressure, and diaphoresis. She was found to be in rapid A fib upon arrival, though she denies experiencing palpitations. She is now rate controlled. Will change to po Cardizem today. Cont home dose of metoprolol.  CHA2DS2VASc = 7. She is on IV heparin. Will plan to switch to NOAC but will likely need cath prior to discharge.  2. NSTEMI/Demand ischemia/CAD: Known prior h/o CAD s/p RCA BMS  in 2002 - residual LAD and Diag dzs. She has not had ischemic evaluation since and has not followed with cardiology as an outpt either. She has been having chest pressure and dyspnea intermittently since 12/17/15. ECG notable for new LBBB in setting of AF RVR. Troponin peak of 1.26 and now trending down. Continue ASA, statin and beta blocker as well as IV heparin. Will arrange echo today. She will need cath before discharge. Given h/o renal transplant, will ask nephrology to be on board.  3. Hypertensive Heart Disease: BP currently stable. Cont beta blocker. Hold home dose of norvasc as she'll be on IV dilt.  4. HLD: Cont statin.   5. CKD III s/p prior renal tx: Creat slightly worsened today. Continue prograf. Nephrology consult as she may require cath/contrast.  6. Possible pneumonia: Cough and intermittent diaphoresis with persistent fatigue since 5/14. CXR with left base infiltrate. Blood culture positive for strept pneum. Will continue Rocephin.   7. Type  II DM: Apparently diet controlled.Will add SSI and check A1c.  8. Chronic diastolic CHF: BNP up though appears relatively euvolemic. She has not had her lasix in several days. Continue home dose of lasix for now. Will plan to hold pre-cath.     Joy CarrowChristopher Jordany [name]  5/19/20178:01 AM

## 2015-12-20 ENCOUNTER — Inpatient Hospital Stay (HOSPITAL_COMMUNITY): Payer: Medicare Other

## 2015-12-20 DIAGNOSIS — I480 Paroxysmal atrial fibrillation: Secondary | ICD-10-CM

## 2015-12-20 DIAGNOSIS — J9601 Acute respiratory failure with hypoxia: Secondary | ICD-10-CM

## 2015-12-20 DIAGNOSIS — Z940 Kidney transplant status: Secondary | ICD-10-CM

## 2015-12-20 DIAGNOSIS — J13 Pneumonia due to Streptococcus pneumoniae: Principal | ICD-10-CM

## 2015-12-20 LAB — BLOOD GAS, ARTERIAL
ACID-BASE DEFICIT: 4.6 mmol/L — AB (ref 0.0–2.0)
BICARBONATE: 18.4 meq/L — AB (ref 20.0–24.0)
DRAWN BY: 257701
FIO2: 0.55
O2 Content: 14 L/min
O2 SAT: 94.2 %
PATIENT TEMPERATURE: 98.6
PH ART: 7.474 — AB (ref 7.350–7.450)
TCO2: 19.2 mmol/L (ref 0–100)
pCO2 arterial: 25.3 mmHg — ABNORMAL LOW (ref 35.0–45.0)
pO2, Arterial: 69.1 mmHg — ABNORMAL LOW (ref 80.0–100.0)

## 2015-12-20 LAB — GLUCOSE, CAPILLARY
GLUCOSE-CAPILLARY: 150 mg/dL — AB (ref 65–99)
Glucose-Capillary: 137 mg/dL — ABNORMAL HIGH (ref 65–99)
Glucose-Capillary: 168 mg/dL — ABNORMAL HIGH (ref 65–99)
Glucose-Capillary: 166 mg/dL — ABNORMAL HIGH (ref 65–99)
Glucose-Capillary: 143 mg/dL — ABNORMAL HIGH (ref 65–99)

## 2015-12-20 LAB — CBC
HEMATOCRIT: 35.9 % — AB (ref 36.0–46.0)
HEMOGLOBIN: 11.8 g/dL — AB (ref 12.0–15.0)
MCH: 28.6 pg (ref 26.0–34.0)
MCHC: 32.9 g/dL (ref 30.0–36.0)
MCV: 87.1 fL (ref 78.0–100.0)
Platelets: 244 10*3/uL (ref 150–400)
RBC: 4.12 MIL/uL (ref 3.87–5.11)
RDW: 13.6 % (ref 11.5–15.5)
WBC: 14.9 10*3/uL — AB (ref 4.0–10.5)

## 2015-12-20 LAB — BASIC METABOLIC PANEL
ANION GAP: 13 (ref 5–15)
BUN: 36 mg/dL — AB (ref 6–20)
CALCIUM: 9.9 mg/dL (ref 8.9–10.3)
CO2: 22 mmol/L (ref 22–32)
Chloride: 96 mmol/L — ABNORMAL LOW (ref 101–111)
Creatinine, Ser: 1.54 mg/dL — ABNORMAL HIGH (ref 0.44–1.00)
GFR calc Af Amer: 35 mL/min — ABNORMAL LOW (ref >60)
GFR, EST NON AFRICAN AMERICAN: 30 mL/min — AB (ref >60)
GLUCOSE: 166 mg/dL — AB (ref 65–99)
Potassium: 3.7 mmol/L (ref 3.5–5.1)
Sodium: 131 mmol/L — ABNORMAL LOW (ref 135–145)

## 2015-12-20 LAB — HEPARIN LEVEL (UNFRACTIONATED)
Heparin Unfractionated: 0.28 IU/mL — ABNORMAL LOW (ref 0.30–0.70)
Heparin Unfractionated: 0.64 IU/mL (ref 0.30–0.70)

## 2015-12-20 MED ORDER — SODIUM CHLORIDE 0.9 % IV BOLUS (SEPSIS)
250.0000 mL | Freq: Once | INTRAVENOUS | Status: AC
  Administered 2015-12-20: 250 mL via INTRAVENOUS

## 2015-12-20 MED ORDER — ALBUTEROL SULFATE (2.5 MG/3ML) 0.083% IN NEBU
2.5000 mg | INHALATION_SOLUTION | Freq: Four times a day (QID) | RESPIRATORY_TRACT | Status: DC
  Administered 2015-12-20: 2.5 mg via RESPIRATORY_TRACT
  Filled 2015-12-20: qty 3

## 2015-12-20 MED ORDER — CINACALCET HCL 30 MG PO TABS
30.0000 mg | ORAL_TABLET | Freq: Every day | ORAL | Status: DC
  Administered 2015-12-20 – 2015-12-31 (×10): 30 mg via ORAL
  Filled 2015-12-20 (×13): qty 1

## 2015-12-20 MED ORDER — TRAMADOL HCL 50 MG PO TABS
50.0000 mg | ORAL_TABLET | Freq: Four times a day (QID) | ORAL | Status: DC | PRN
  Administered 2015-12-20 – 2015-12-24 (×2): 50 mg via ORAL
  Filled 2015-12-20 (×2): qty 1

## 2015-12-20 MED ORDER — ALBUTEROL SULFATE (2.5 MG/3ML) 0.083% IN NEBU
2.5000 mg | INHALATION_SOLUTION | Freq: Four times a day (QID) | RESPIRATORY_TRACT | Status: DC
  Administered 2015-12-20 (×2): 2.5 mg via RESPIRATORY_TRACT
  Filled 2015-12-20 (×3): qty 3

## 2015-12-20 MED ORDER — DM-GUAIFENESIN ER 30-600 MG PO TB12
1.0000 | ORAL_TABLET | Freq: Two times a day (BID) | ORAL | Status: DC
  Administered 2015-12-20: 1 via ORAL
  Filled 2015-12-20: qty 1

## 2015-12-20 MED ORDER — GUAIFENESIN ER 600 MG PO TB12
1200.0000 mg | ORAL_TABLET | Freq: Two times a day (BID) | ORAL | Status: DC
  Administered 2015-12-20 – 2016-01-01 (×25): 1200 mg via ORAL
  Filled 2015-12-20 (×25): qty 2

## 2015-12-20 NOTE — Progress Notes (Signed)
   12/20/15 1015  Vitals  BP (!) 92/55 mmHg  MAP (mmHg) 67  BP Location Left Arm  BP Method Automatic  Patient Position (if appropriate) Lying  Pulse Rate (!) 37  Pulse Rate Source Monitor  ECG Heart Rate 65  Resp (!) 29  Oxygen Therapy  SpO2 94 %  O2 Device Nasal Cannula  O2 Flow Rate (L/min) 3 L/min  Pain Assessment  Pain Assessment No/denies pain  Glasgow Coma Scale  Eye Opening 3  Best Verbal Response (NON-intubated) 5  Best Motor Response 6  Glasgow Coma Scale Score 14  MD/Skains notified of decreasing HR and OBS change in rhythm, EKG done results repeated to MD. Cardizem gtt held.

## 2015-12-20 NOTE — Progress Notes (Signed)
SUBJECTIVE: Breathing slightly worse today. Coughing slightly worse..   Tele: appears to be sinus tachy this am 101 bpm, on diltiazem drip  BP 117/68 mmHg  Pulse 100  Temp(Src) 98 F (36.7 C) (Oral)  Resp 24  Ht 5\' 3"  (1.6 m)  Wt 164 lb 7.4 oz (74.6 kg)  BMI 29.14 kg/m2  SpO2 91%  Intake/Output Summary (Last 24 hours) at 12/20/15 0842 Last data filed at 12/20/15 0800  Gross per 24 hour  Intake 3265.26 ml  Output    100 ml  Net 3165.26 ml    PHYSICAL EXAM General: Well developed, well nourished, in no acute distress. Alert and oriented x 3.  Psych:  Good affect, responds appropriately Neck: No JVD. No masses noted.  Lungs: Mildly coarse with Mild wheezes heard especially left. No significant increased respiratory effort Heart: RRR with no murmurs noted. Abdomen: Bowel sounds are present. Soft, non-tender.  Extremities: No lower extremity edema.   LABS: Basic Metabolic Panel:  Recent Labs  45/40/9805/18/17 0830 12/18/15 1330 12/19/15 0242  NA 134*  --  134*  K 3.9  --  3.8  CL 98*  --  96*  CO2 20*  --  24  GLUCOSE 166*  --  132*  BUN 21*  --  28*  CREATININE 1.35*  --  1.51*  CALCIUM 10.0  --  10.0  MG  --  2.0  --    CBC:  Recent Labs  12/18/15 0830 12/19/15 0242 12/20/15 0800  WBC 22.0* 16.0* 14.9*  NEUTROABS 19.2*  --   --   HGB 12.7 11.3* 11.8*  HCT 39.4 35.2* 35.9*  MCV 87.8 87.8 87.1  PLT 190 216 244   Cardiac Enzymes:  Recent Labs  12/18/15 1330 12/18/15 1822 12/19/15 0124  TROPONINI 1.04* 1.26* 0.93*   Fasting Lipid Panel:  Recent Labs  12/19/15 0242  CHOL 101  HDL 34*  LDLCALC 50  TRIG 83  CHOLHDL 3.0    Current Meds: . allopurinol  100 mg Oral QHS  . antiseptic oral rinse  7 mL Mouth Rinse BID  . aspirin EC  81 mg Oral Daily  . atorvastatin  10 mg Oral q1800  . cefTRIAXone (ROCEPHIN)  IV  2 g Intravenous Q24H  . Chlorhexidine Gluconate Cloth  6 each Topical Q0600  . cinacalcet  30 mg Oral Q breakfast  . diltiazem   240 mg Oral Daily  . famotidine  20 mg Oral Daily  . furosemide  80 mg Oral Daily  . guaiFENesin  200 mg Oral Q6H  . insulin aspart  0-15 Units Subcutaneous TID WC  . metoprolol  100 mg Oral BID  . mupirocin ointment  1 application Nasal BID  . mycophenolate  500 mg Oral BID  . sodium chloride flush  3 mL Intravenous Q12H  . tacrolimus  2 mg Oral QHS  . tacrolimus  2 mg Oral q morning - 10a   ECHO 12/19/15: - Left ventricle: The cavity size was normal. Wall thickness was  increased in a pattern of severe LVH. Systolic function was  normal. The estimated ejection fraction was in the range of 55%  to 60%. Wall motion was normal; there were no regional wall  motion abnormalities. - Mitral valve: There was mild regurgitation. - Left atrium: The atrium was moderately dilated. - Tricuspid valve: There was moderate regurgitation. - Pulmonary arteries: Systolic pressure was moderately increased.  PA peak pressure: 44 mm Hg (S).  Impressions:  -  Normal LV systolic function; severe LVH; moderate LAE; mild MR;  moderate TR; moderately elevated pulmonary pressure.  ASSESSMENT AND PLAN: 82 yo female with known CAD, DM, HTN, CKD s/p renal transplant admitted 12/18/15 with cough, fatigue, chest pain and found to have pneumonia and atrial fib with RVR, elevated troponin.   1. Atrial fibrillation with GNF:AOZH morning appears to be sinus tachycardia although this could represent an underlying flutter wave. Heart rate approximately 100-105. Improved.  Pt presents with a 5 day h/o progressive fatigue followed by dyspnea, chest pressure, and diaphoresis. She was found to be in rapid A fib upon arrival, though she denies experiencing palpitations. She is now rate controlled. Tried to change to po Cardizem but she was placed back on drip. We'll continue drip for now and hopefully change to by mouth tomorrow. Cont home dose of metoprolol.  CHA2DS2VASc = 7. She is on IV heparin. Will plan to switch  to NOAC but will likely need cath prior to discharge.  2. NSTEMI/Demand ischemia/CAD: Known prior h/o CAD s/p RCA BMS in 2002 - residual LAD and Diag dzs. She has not had ischemic evaluation since and has not followed with cardiology as an outpt either. She has been having chest pressure and dyspnea intermittently since 12/17/15. ECG notable for new LBBB in setting of AF RVR. Troponin peak of 1.26 and now trending down. Continue ASA, statin and beta blocker as well as IV heparin. Will arrange echo today. She will need cath before discharge. Given h/o renal transplant,  nephrology on board. Reassuring echocardiogram with normal EF.  3. Hypertensive Heart Disease: BP currently stable. Cont beta blocker. Hold home dose of norvasc  4. HLD: Cont statin.   5. CKD III s/p prior renal tx: Creat slightly worsened today. Continue prograf. Nephrology consult as she may require cath/contrast. Appreciate nephrology. Decreased tacrolimus in the setting of diltiazem which can increase levels of tacrolimus.  6. Possible pneumonia: Cough and intermittent diaphoresis with persistent fatigue since 5/14. CXR with left base infiltrate. Blood culture positive for strept pneum. Will continue Rocephin. Wheezes heard left lung. I will give her albuterol nebulizer, Mucinex.  7. Type II DM: Apparently diet controlled.Will add SSI and check A1c.  8. Chronic diastolic CHF: BNP up though appears relatively euvolemic. Continue home dose of lasix for now. Will plan to hold pre-cath.     [name]  5/20/20178:42 AM

## 2015-12-20 NOTE — Progress Notes (Signed)
    Heart rate decreased into the 50s, atrial fibrillation, occasional PVC. Diltiazem drip stopped. She is becoming more fatigued, weak. Facemask oxygen has been placed. She received her by mouth Lasix early this morning.  -Chest x-ray has been ordered -ABG ordered -Earlier today albuterol nebs were ordered, Mucinex ordered -Lungs still demonstrate wheeze  She is still conversant. -I have consulted critical care medicine, Dr. Craige CottaSood and team for evaluation of possible impending respiratory failure/evaluation of generalized weakness.  Donato SchultzMark Skains, MD

## 2015-12-20 NOTE — Progress Notes (Signed)
ANTICOAGULATION CONSULT NOTE - Follow Up Consult  Pharmacy Consult for Heparin Indication: elevated troponins  Allergies  Allergen Reactions  . Sulfa Drugs Cross Reactors Swelling    Patient Measurements: Height: 5\' 3"  (160 cm) Weight: 164 lb 7.4 oz (74.6 kg) IBW/kg (Calculated) : 52.4 Heparin Dosing Weight: 68 kg  Vital Signs: Temp: 98 F (36.7 C) (05/20 0800) Temp Source: Oral (05/20 0800) BP: 117/68 mmHg (05/20 0800) Pulse Rate: 100 (05/20 0800)  Labs:  Recent Labs  12/18/15 0830 12/18/15 1330  12/18/15 1822 12/19/15 0124 12/19/15 0242 12/19/15 1328 12/19/15 2220 12/20/15 0800  HGB 12.7  --   --   --   --  11.3*  --   --  11.8*  HCT 39.4  --   --   --   --  35.2*  --   --  35.9*  PLT 190  --   --   --   --  216  --   --  244  HEPARINUNFRC  --   --   < > <0.10*  --  <0.10* 0.43 0.29* 0.28*  CREATININE 1.35*  --   --   --   --  1.51*  --   --  1.54*  TROPONINI 0.98* 1.04*  --  1.26* 0.93*  --   --   --   --   < > = values in this interval not displayed.  Estimated Creatinine Clearance: 27.7 mL/min (by C-G formula based on Cr of 1.54).  Assessment: 82 y.o. F on heparin for r/o ACS. Heparin level  slightly subtherapeutic after increase in infusion rate from 1300 units/hr to 1400 units/hr. Hgb stable, PLT wnl. No issues with line or bleeding reported per RN.  Goal of Therapy:  Heparin level 0.3-0.7 units/ml Monitor platelets by anticoagulation protocol: Yes   Plan:  Increase heparin to 1550 units/hr F/u 8 hr heparin level Daily HL/CBC   Sherron MondayAubrey N. Trulee Hamstra, PharmD Clinical Pharmacy Resident Pager: [phone] 12/20/2015 9:08 AM

## 2015-12-20 NOTE — Progress Notes (Signed)
Pt Hr 49 non-sus, pt obs to be sleepy, [facility] new orders received, nursing will cont to monitor, Elink RN also updated

## 2015-12-20 NOTE — Consult Note (Signed)
PULMONARY / CRITICAL CARE MEDICINE   Name: [name] MRN: [id number] DOB: 10-Sep-1931    ADMISSION DATE:  12/18/2015 CONSULTATION DATE:  12/20/2015  REFERRING MD:  Dr. Anne Fu  CHIEF COMPLAINT:  Short of breath  HISTORY OF PRESENT ILLNESS:   82 yo female presented to ER with progressive dyspnea and chest pain.  This was She had wide complex tachycardia.  She was admitted by cardiology.  There was concern for pneumonia and she was started on antibiotics. She had productive cough, and progressive dyspnea with hypoxia.  She had blood culture positive for pneumococcus.  She has mild nausea.  Her chest pain has improved >> was pleuritic type on right.  She feels like she has no energy.  PAST MEDICAL HISTORY :  She  has a past medical history of S/P kidney transplant; Coronary artery disease; Hypertensive heart disease; Cholelithiasis; Chronic diastolic CHF (congestive heart failure) (HCC); Gout; CKD (chronic kidney disease), stage III; Candidal esophagitis (HCC); Gastritis; Type II diabetes mellitus (HCC); and History of bacteremia.  PAST SURGICAL HISTORY: She  has past surgical history that includes Kidney transplant (2003); Abdominal hysterectomy; and Esophagogastroduodenoscopy (N/A, 03/20/2014).  Allergies  Allergen Reactions  . Sulfa Drugs Cross Reactors Swelling    No current facility-administered medications on file prior to encounter.   Current Outpatient Prescriptions on File Prior to Encounter  Medication Sig  . allopurinol (ZYLOPRIM) 100 MG tablet Take 100 mg by mouth at bedtime.  . colchicine 0.6 MG tablet Take 0.6 mg by mouth daily as needed. For gout  . famotidine (PEPCID) 20 MG tablet Take 20 mg by mouth 2 (two) times daily.  . furosemide (LASIX) 80 MG tablet Take 1 tablet (80 mg total) by mouth daily.  . metoprolol (LOPRESSOR) 50 MG tablet Take 100 mg by mouth 2 (two) times daily.  . mycophenolate (CELLCEPT) 250 MG capsule Take 500 mg by mouth 2 (two) times daily.  Marland Kitchen  senna-docusate (SENOKOT-S) 8.6-50 MG per tablet Take 1 tablet by mouth daily as needed for mild constipation.  . simvastatin (ZOCOR) 20 MG tablet Take 20 mg by mouth at bedtime.   Marland Kitchen amLODipine (NORVASC) 10 MG tablet Take 5 mg by mouth Daily.   Marland Kitchen aspirin EC 81 MG tablet Take 81 mg by mouth daily.  . chlorpheniramine-HYDROcodone (TUSSIONEX) 10-8 MG/5ML LQCR Take 5 mLs by mouth every 12 (twelve) hours as needed for cough.  . cinacalcet (SENSIPAR) 90 MG tablet Take 30 mg by mouth daily.   . pantoprazole (PROTONIX) 20 MG tablet Take 1 tablet (20 mg total) by mouth daily.  . tacrolimus (PROGRAF) 0.5 MG capsule Take 1 capsule (0.5 mg total) by mouth daily. Take 1 capsule by mouth every morning along with one of the 1mg  capsules for a total dose of 1.5mg  in the morning and take 1 capsule by mouth every morning along with one of the 1mg  capsules for a total dose of 1.5mg  at night. (Patient taking differently: Take 0.5 mg by mouth daily. Takes 0.5mg  along with 1mg  in morning and evening to equal total dose of 1.5mg  twice daily)    FAMILY HISTORY:  Her family history is negative for scurvy.  SOCIAL HISTORY: She  reports that she quit smoking about 46 years ago. She has never used smokeless tobacco. She reports that she does not drink alcohol or use illicit drugs.  REVIEW OF SYSTEMS:   Negative except above  SUBJECTIVE:  Feels fatigued.  VITAL SIGNS: BP 92/57 mmHg  Pulse 60  Temp(Src) 98  F (36.7 C) (Oral)  Resp 17  Ht 5\' 3"  (1.6 m)  Wt 164 lb 7.4 oz (74.6 kg)  BMI 29.14 kg/m2  SpO2 95%  INTAKE / OUTPUT: I/O last 3 completed shifts: In: 4473 [P.O.:240; I.V.:4133; IV Piggyback:100] Out: 200 [Urine:200]  PHYSICAL EXAMINATION: General:  alert Neuro:  Normal strength, CN intact HEENT:  No sinus tenderness, no LAN, no stridor Cardiovascular: irregular Lungs:  Bronchial breath sounds with crackles Rt base, no wheeze Abdomen:  Soft, non tender Musculoskeletal:  No edema Skin:  No  rashes  LABS:  BMET  Recent Labs Lab 12/18/15 0830 12/19/15 0242 12/20/15 0800  NA 134* 134* 131*  K 3.9 3.8 3.7  CL 98* 96* 96*  CO2 20* 24 22  BUN 21* 28* 36*  CREATININE 1.35* 1.51* 1.54*  GLUCOSE 166* 132* 166*    Electrolytes  Recent Labs Lab 12/18/15 0830 12/18/15 1330 12/19/15 0242 12/20/15 0800  CALCIUM 10.0  --  10.0 9.9  MG  --  2.0  --   --     CBC  Recent Labs Lab 12/18/15 0830 12/19/15 0242 12/20/15 0800  WBC 22.0* 16.0* 14.9*  HGB 12.7 11.3* 11.8*  HCT 39.4 35.2* 35.9*  PLT 190 216 244    Coag's No results for input(s): APTT, INR in the last 168 hours.  Sepsis Markers  Recent Labs Lab 12/18/15 1055  LATICACIDVEN 1.48    ABG  Recent Labs Lab 12/20/15 1109  PHART 7.474*  PCO2ART 25.3*  PO2ART 69.1*    Liver Enzymes  Recent Labs Lab 12/18/15 0830  AST 22  ALT 10*  ALKPHOS 99  BILITOT 2.0*  ALBUMIN 2.8*    Cardiac Enzymes  Recent Labs Lab 12/18/15 1330 12/18/15 1822 12/19/15 0124  TROPONINI 1.04* 1.26* 0.93*    Glucose  Recent Labs Lab 12/19/15 0806 12/19/15 1216 12/19/15 1805 12/19/15 2120 12/20/15 0748 12/20/15 1227  GLUCAP 115* 122* 137* 132* 168* 150*    Imaging Dg Chest Port 1 View  12/20/2015  CLINICAL DATA:  Shortness of breath EXAM: PORTABLE CHEST 1 VIEW COMPARISON:  12/18/2015 FINDINGS: New right lower lung opacity, likely combination of atelectasis/ pneumonia and layering pleural effusion. Mild left basilar opacity, possibly atelectasis. Cardiomegaly.  No [name] interstitial edema. No pneumothorax. IMPRESSION: New right lower lung opacity, likely combination of atelectasis/ pneumonia and layering pleural effusion. Mild left basilar opacity, possibly atelectasis. Electronically Signed   By: Charline BillsSriyesh  Krishnan [name]   On: 12/20/2015 11:53     STUDIES:  5/19 Echo >> EF 55 to 60%, severe LVH  CULTURES: 5/18 Blood >> Pneumococcus  ANTIBIOTICS: 5/18 Zithromax >> 5/18 5/18 Rocephin  >>  SIGNIFICANT EVENTS: 5/18 Admit  LINES/TUBES:  DISCUSSION: 82 yo female with pneumococcal PNA with bacteremia and acute hypoxic respiratory failure.  She also has A fib with RVR, NSTEMI from demand ischemia.  She has hx of Stage III CKD s/p renal transplant, DM, chronic diastolic CHF, HLD  ASSESSMENT / PLAN:  PULMONARY A: Acute hypoxic respiratory failure 2nd to CAP. Small Rt pleural effusion P:   Oxygen to keep SpO2 > 92% F/u CXR Bronchial hygiene, BDs  CARDIOVASCULAR A:  A fib. NSTEMI. Chronic diastolic CHF, HLD. P:  Per cardiology  RENAL A:   CKD 3 s/p renal transplant. P:   Monitor renal fx Continue prograf, cellcept  GASTROINTESTINAL A:   Nutrition. P:   Carb modified diet  HEMATOLOGIC A:   Leukocytosis. P:  F/u CBC  INFECTIOUS A:   Pneumococcal PNA with  bacteremia. P:   Day 3 rocephin  ENDOCRINE A:   DM type II.   P:   SSI  NEUROLOGIC A:   No acute issues. P:   Monitor mental status  Coralyn Helling, MD [facility] Pulmonary/Critical Care 12/20/2015, 12:55 PM Pager:  [phone] After 3pm call: [phone]

## 2015-12-20 NOTE — Progress Notes (Signed)
ANTICOAGULATION CONSULT NOTE - Follow Up Consult  Pharmacy Consult for Heparin Indication: elevated troponins  Allergies  Allergen Reactions  . Sulfa Drugs Cross Reactors Swelling    Patient Measurements: Height: 5\' 3"  (160 cm) Weight: 161 lb 9.6 oz (73.3 kg) IBW/kg (Calculated) : 52.4 Heparin Dosing Weight: 68 kg  Vital Signs: Temp: 97.6 F (36.4 C) (05/19 2019) Temp Source: Oral (05/19 2019) BP: 108/64 mmHg (05/19 2019) Pulse Rate: 108 (05/19 2019)  Labs:  Recent Labs  12/18/15 0830 12/18/15 1330  12/18/15 1822 12/19/15 0124 12/19/15 0242 12/19/15 1328 12/19/15 2220  HGB 12.7  --   --   --   --  11.3*  --   --   HCT 39.4  --   --   --   --  35.2*  --   --   PLT 190  --   --   --   --  216  --   --   HEPARINUNFRC  --   --   < > <0.10*  --  <0.10* 0.43 0.29*  CREATININE 1.35*  --   --   --   --  1.51*  --   --   TROPONINI 0.98* 1.04*  --  1.26* 0.93*  --   --   --   < > = values in this interval not displayed.  Estimated Creatinine Clearance: 28 mL/min (by C-G formula based on Cr of 1.51).  Assessment: 82 y.o. F on heparin for r/o ACS. Heparin level down to slightly subtherapeutic at 1300 units/hr. No issues with line or bleeding reported per RN.  Goal of Therapy:  Heparin level 0.3-0.7 units/ml Monitor platelets by anticoagulation protocol: Yes   Plan:  Increase heparin to 1400 units/hr F/u 8 hr heparin level  Christoper Fabianaron Laisha Rau, PharmD, BCPS Clinical pharmacist, pager [phone] 12/20/2015 12:07 AM

## 2015-12-20 NOTE — Progress Notes (Signed)
PT demonstrated verbal and hands on understanding of Flutter device. 

## 2015-12-20 NOTE — Progress Notes (Signed)
Admit: 12/18/2015 LOS: 2  56F admit with AFib with RVR new onset, NSTEMI, hx/o KT 2003 on Tac/MMF. Rate control with dilitiazem  Subjective:  No new events   05/19 0701 - 05/20 0700 In: 4111.6 [P.O.:240; I.V.:3821.6; IV Piggyback:50] Out: 100 [Urine:100]  Filed Weights   12/18/15 0831 12/19/15 0500 12/20/15 0400  Weight: 73.936 kg (163 lb) 73.3 kg (161 lb 9.6 oz) 74.6 kg (164 lb 7.4 oz)    Scheduled Meds: . allopurinol  100 mg Oral QHS  . antiseptic oral rinse  7 mL Mouth Rinse BID  . aspirin EC  81 mg Oral Daily  . atorvastatin  10 mg Oral q1800  . cefTRIAXone (ROCEPHIN)  IV  2 g Intravenous Q24H  . Chlorhexidine Gluconate Cloth  6 each Topical Q0600  . cinacalcet  30 mg Oral Q breakfast  . diltiazem  240 mg Oral Daily  . famotidine  20 mg Oral Daily  . furosemide  80 mg Oral Daily  . guaiFENesin  200 mg Oral Q6H  . insulin aspart  0-15 Units Subcutaneous TID WC  . metoprolol  100 mg Oral BID  . mupirocin ointment  1 application Nasal BID  . mycophenolate  500 mg Oral BID  . sodium chloride flush  3 mL Intravenous Q12H  . tacrolimus  2 mg Oral QHS  . tacrolimus  2 mg Oral q morning - 10a   Continuous Infusions: . sodium chloride 20 mL/hr at 12/19/15 1900  . diltiazem (CARDIZEM) infusion 10 mg/hr (12/20/15 0511)  . heparin 1,400 Units/hr (12/20/15 0112)   PRN Meds:.sodium chloride, acetaminophen, nitroGLYCERIN, ondansetron (ZOFRAN) IV, senna-docusate, sodium chloride flush, traMADol  Current Labs: reviewed    Physical Exam:  Blood pressure 113/66, pulse 99, temperature 97.8 F (36.6 C), temperature source Oral, resp. rate 29, height 5\' 3"  (1.6 m), weight 74.6 kg (164 lb 7.4 oz), SpO2 94 %. GEN: NAD ENT: NCAT EYES: EOMI CV: IR, normal rate, no rub, nl s1s2 PULM: ctab, nl wob ABD: s/nt/nd SKIN: no rashes/lesions EXT:No LEE   Assessment   1. S/p Kid Tplt 2003 on Tac/MMF, BL SCr 1.0-1.4, sees Mattingly at [facility]  1. outpt tac is 3mg  qAM 2mg  qPM 2. Empiric  dose reduced to 2mg  BID with initiation of dilitiazem 3. Following tac levels as able 2. AFib with RVR, new onsent 3. NSTEMI, known CAD 4. CAP on ceftriaxone 5. HTN 6. HLD 7. Chronic dCHF  Plan 1. Await labs this AM 2. Tac level to be checked daily at 930 to 10am 3. cont tacrolimus dosing to 2 mg twice daily 4. Continue mycophenolate at current dosing 5. Will closely follow   Sabra Heckyan Taren Dymek MD 12/20/2015, 8:22 AM   Recent Labs Lab 12/18/15 0830 12/19/15 0242  NA 134* 134*  K 3.9 3.8  CL 98* 96*  CO2 20* 24  GLUCOSE 166* 132*  BUN 21* 28*  CREATININE 1.35* 1.51*  CALCIUM 10.0 10.0    Recent Labs Lab 12/18/15 0830 12/19/15 0242  WBC 22.0* 16.0*  NEUTROABS 19.2*  --   HGB 12.7 11.3*  HCT 39.4 35.2*  MCV 87.8 87.8  PLT 190 216

## 2015-12-20 NOTE — Progress Notes (Signed)
ANTICOAGULATION CONSULT NOTE - Follow Up Consult  Pharmacy Consult for Heparin Indication: elevated troponins  Allergies  Allergen Reactions  . Sulfa Drugs Cross Reactors Swelling    Patient Measurements: Height: 5\' 3"  (160 cm) Weight: 164 lb 7.4 oz (74.6 kg) IBW/kg (Calculated) : 52.4 Heparin Dosing Weight: 68 kg  Vital Signs: Temp: 97.9 F (36.6 C) (05/20 1638) Temp Source: Oral (05/20 1638) BP: 94/56 mmHg (05/20 1700) Pulse Rate: 36 (05/20 1700)  Labs:  Recent Labs  12/18/15 0830 12/18/15 1330  12/18/15 1822 12/19/15 0124 12/19/15 0242  12/19/15 2220 12/20/15 0800 12/20/15 1550  HGB 12.7  --   --   --   --  11.3*  --   --  11.8*  --   HCT 39.4  --   --   --   --  35.2*  --   --  35.9*  --   PLT 190  --   --   --   --  216  --   --  244  --   HEPARINUNFRC  --   --   < > <0.10*  --  <0.10*  < > 0.29* 0.28* 0.64  CREATININE 1.35*  --   --   --   --  1.51*  --   --  1.54*  --   TROPONINI 0.98* 1.04*  --  1.26* 0.93*  --   --   --   --   --   < > = values in this interval not displayed.  Estimated Creatinine Clearance: 27.7 mL/min (by C-G formula based on Cr of 1.54).  Assessment: 82 y.o. F on heparin for r/o ACS. Heparin level is now therapeutic. No bleeding noted.    Goal of Therapy:  Heparin level 0.3-0.7 units/ml Monitor platelets by anticoagulation protocol: Yes   Plan:  Continue heparin gtt at 1550 units/hr Confirm dosing with AM heparin level Daily heparin level and CBC  Lysle Pearlachel Kerin Kren, PharmD, BCPS Pager # [phone] 12/20/2015 5:20 PM

## 2015-12-21 ENCOUNTER — Inpatient Hospital Stay (HOSPITAL_COMMUNITY): Payer: Medicare Other

## 2015-12-21 DIAGNOSIS — J948 Other specified pleural conditions: Secondary | ICD-10-CM

## 2015-12-21 DIAGNOSIS — J189 Pneumonia, unspecified organism: Secondary | ICD-10-CM | POA: Insufficient documentation

## 2015-12-21 LAB — CBC
HEMATOCRIT: 36.2 % (ref 36.0–46.0)
HEMOGLOBIN: 11.5 g/dL — AB (ref 12.0–15.0)
MCH: 28.2 pg (ref 26.0–34.0)
MCHC: 31.8 g/dL (ref 30.0–36.0)
MCV: 88.7 fL (ref 78.0–100.0)
Platelets: 280 10*3/uL (ref 150–400)
RBC: 4.08 MIL/uL (ref 3.87–5.11)
RDW: 13.8 % (ref 11.5–15.5)
WBC: 14.6 10*3/uL — ABNORMAL HIGH (ref 4.0–10.5)

## 2015-12-21 LAB — CULTURE, BLOOD (ROUTINE X 2)

## 2015-12-21 LAB — BASIC METABOLIC PANEL
ANION GAP: 17 — AB (ref 5–15)
BUN: 47 mg/dL — ABNORMAL HIGH (ref 6–20)
CO2: 17 mmol/L — ABNORMAL LOW (ref 22–32)
Calcium: 9 mg/dL (ref 8.9–10.3)
Chloride: 95 mmol/L — ABNORMAL LOW (ref 101–111)
Creatinine, Ser: 2.05 mg/dL — ABNORMAL HIGH (ref 0.44–1.00)
GFR, EST AFRICAN AMERICAN: 25 mL/min — AB (ref >60)
GFR, EST NON AFRICAN AMERICAN: 22 mL/min — AB (ref >60)
Glucose, Bld: 147 mg/dL — ABNORMAL HIGH (ref 65–99)
POTASSIUM: 3.9 mmol/L (ref 3.5–5.1)
SODIUM: 129 mmol/L — AB (ref 135–145)

## 2015-12-21 LAB — HEPARIN LEVEL (UNFRACTIONATED): HEPARIN UNFRACTIONATED: 0.65 [IU]/mL (ref 0.30–0.70)

## 2015-12-21 LAB — GLUCOSE, CAPILLARY
GLUCOSE-CAPILLARY: 128 mg/dL — AB (ref 65–99)
GLUCOSE-CAPILLARY: 140 mg/dL — AB (ref 65–99)
Glucose-Capillary: 137 mg/dL — ABNORMAL HIGH (ref 65–99)
Glucose-Capillary: 149 mg/dL — ABNORMAL HIGH (ref 65–99)

## 2015-12-21 MED ORDER — ALBUTEROL SULFATE (2.5 MG/3ML) 0.083% IN NEBU
2.5000 mg | INHALATION_SOLUTION | Freq: Two times a day (BID) | RESPIRATORY_TRACT | Status: DC
  Administered 2015-12-21 – 2016-01-01 (×22): 2.5 mg via RESPIRATORY_TRACT
  Filled 2015-12-21 (×24): qty 3

## 2015-12-21 NOTE — Progress Notes (Signed)
SUBJECTIVE: Breathing worse yesterday 12/20/15. Coughing slightly worse. Dr. Craige Cotta consulted. Pneumococcal PNA. BP soft in the late afternoon 12/20/15, IVF administered. Lasix held 5/21.   Tele: appears to be sinus tachy this am 101 bpm. On 5/20 - HR 50's. Dilt stopped.   BP 100/62 mmHg  Pulse 104  Temp(Src) 97.5 F (36.4 C) (Oral)  Resp 26  Ht  (1.6 m)  Wt 166 lb 10.7 oz (75.6 kg)  BMI 29.53 kg/m2  SpO2 93%  Intake/Output Summary (Last 24 hours) at 12/21/15 1610 Last data filed at 12/21/15 0800  Gross per 24 hour  Intake 1765.53 ml  Output    200 ml  Net 1565.53 ml    PHYSICAL EXAM General: Well developed, well nourished, in no acute distress. Alert and oriented x 3.  Psych:  Good affect, responds appropriately Neck: No JVD. No masses noted.  Lungs: Mildly coarse with Mild wheezes heard especially left. No significant increased respiratory effort Heart: RRR with no murmurs noted. Abdomen: Bowel sounds are present. Soft, non-tender.  Extremities: No lower extremity edema.   LABS: Basic Metabolic Panel:  Recent Labs  96/04/54 1330  12/20/15 0800 12/21/15 0252  NA  --   < > 131* 129*  K  --   < > 3.7 3.9  CL  --   < > 96* 95*  CO2  --   < > 22 17*  GLUCOSE  --   < > 166* 147*  BUN  --   < > 36* 47*  CREATININE  --   < > 1.54* 2.05*  CALCIUM  --   < > 9.9 9.0  MG 2.0  --   --   --   < > = values in this interval not displayed. CBC:  Recent Labs  12/20/15 0800 12/21/15 0252  WBC 14.9* 14.6*  HGB 11.8* 11.5*  HCT 35.9* 36.2  MCV 87.1 88.7  PLT 244 280   Cardiac Enzymes:  Recent Labs  12/18/15 1330 12/18/15 1822 12/19/15 0124  TROPONINI 1.04* 1.26* 0.93*   Fasting Lipid Panel:  Recent Labs  12/19/15 0242  CHOL 101  HDL 34*  LDLCALC 50  TRIG 83  CHOLHDL 3.0    Current Meds: . albuterol  2.5 mg Nebulization BID  . allopurinol  100 mg Oral QHS  . antiseptic oral rinse  7 mL Mouth Rinse BID  . aspirin EC  81 mg Oral Daily  .  atorvastatin  10 mg Oral q1800  . cefTRIAXone (ROCEPHIN)  IV  2 g Intravenous Q24H  . Chlorhexidine Gluconate Cloth  6 each Topical Q0600  . cinacalcet  30 mg Oral Q supper  . diltiazem  240 mg Oral Daily  . famotidine  20 mg Oral Daily  . guaiFENesin  1,200 mg Oral BID  . insulin aspart  0-15 Units Subcutaneous TID WC  . metoprolol  100 mg Oral BID  . mupirocin ointment  1 application Nasal BID  . mycophenolate  500 mg Oral BID  . sodium chloride flush  3 mL Intravenous Q12H  . tacrolimus  2 mg Oral QHS  . tacrolimus  2 mg Oral q morning - 10a   ECHO 12/19/15: - Left ventricle: The cavity size was normal. Wall thickness was  increased in a pattern of severe LVH. Systolic function was  normal. The estimated ejection fraction was in the range of 55%  to 60%. Wall motion was normal; there were no regional wall  motion  abnormalities. - Mitral valve: There was mild regurgitation. - Left atrium: The atrium was moderately dilated. - Tricuspid valve: There was moderate regurgitation. - Pulmonary arteries: Systolic pressure was moderately increased.  PA peak pressure: 44 mm Hg (S).  Impressions:  - Normal LV systolic function; severe LVH; moderate LAE; mild MR;  moderate TR; moderately elevated pulmonary pressure.  ASSESSMENT AND PLAN: 82 yo female with known CAD, DM, HTN, CKD s/p renal transplant admitted 12/18/15 with cough, fatigue, chest pain and found to have pneuomococcal pneumonia and atrial fib with RVR, elevated troponin.   1. Atrial fibrillation with RVR: Pt presents with a 5 day h/o progressive fatigue followed by dyspnea, chest pressure, and diaphoresis. She was found to be in rapid A fib upon arrival, though she denies experiencing palpitations. She is now rate controlled. Tried to change to po Cardizem but she was placed back on drip. Drip was stopped when HR decreased to 50's on 5/20. Cont home dose of metoprolol.  CHA2DS2VASc = 7. She is on IV heparin. Will plan  to switch to NOAC but prior plan was to cath prior to discharge.  2. NSTEMI/Demand ischemia/CAD: Known prior h/o CAD s/p RCA BMS in 2002 - residual LAD and Diag dzs. She has not had ischemic evaluation since and has not followed with cardiology as an outpt either. She has been having chest pressure and dyspnea intermittently since 12/17/15. ECG notable for new LBBB in setting of AF RVR. Troponin peak of 1.26 and now trending down. Continue ASA, statin and beta blocker as well as IV heparin. Will arrange echo today. Prior plan was to cath before discharge (based on renal and lung improvement). Given h/o renal transplant,  nephrology on board. Reassuring echocardiogram with normal EF. Holding lasix. Perhaps dry (increased creat).   3.Hypertensive Heart Disease: BP currently stable, Was low on 5/20. Cont beta blocker. Hold home dose of norvasc.   4.HLD: Cont statin.   5. CKD III s/p prior renal tx: Creat slightly worsened today. Continue prograf. Nephrology consult as she may require cath/contrast. Appreciate nephrology. Decreased tacrolimus in the setting of diltiazem which can increase levels of tacrolimus. Note she was on norvasc at home. She is worried about kidney. She states she was not urinating much at home prior to admit.   6. Pneumcoccal pneumonia: Cough and intermittent diaphoresis with persistent fatigue since 5/14. CXR with left base infiltrate. Blood culture positive for strept pneum. Will continue Rocephin. Wheezes heard left lung. I will give her albuterol nebulizer, Mucinex. Dr. Craige CottaSood to perform bedside thoracentesis.  7. Type II DM: Apparently diet controlled.Will add SSI and check A1c.  8. Chronic diastolic CHF: BNP up though appears relatively euvolemic. Continue home dose of lasix for now. Will plan to hold pre-cath.     Caius Silbernagel Anne FuSkains  5/21/20179:27 AM

## 2015-12-21 NOTE — Progress Notes (Signed)
ANTICOAGULATION CONSULT NOTE - Follow Up Consult  Pharmacy Consult for Heparin Indication: elevated troponins  Allergies  Allergen Reactions  . Sulfa Drugs Cross Reactors Swelling    Patient Measurements: Height: 5\' 3"  (160 cm) Weight: 166 lb 10.7 oz (75.6 kg) IBW/kg (Calculated) : 52.4 Heparin Dosing Weight: 68 kg  Vital Signs: Temp: 97.5 F (36.4 C) (05/21 0800) Temp Source: Oral (05/21 0800) BP: 100/62 mmHg (05/21 0800) Pulse Rate: 104 (05/21 0800)  Labs:  Recent Labs  12/18/15 1330  12/18/15 1822 12/19/15 0124  12/19/15 0242  12/20/15 0800 12/20/15 1550 12/21/15 0252  HGB  --   --   --   --   < > 11.3*  --  11.8*  --  11.5*  HCT  --   --   --   --   --  35.2*  --  35.9*  --  36.2  PLT  --   --   --   --   --  216  --  244  --  280  HEPARINUNFRC  --   < > <0.10*  --   --  <0.10*  < > 0.28* 0.64 0.65  CREATININE  --   --   --   --   --  1.51*  --  1.54*  --  2.05*  TROPONINI 1.04*  --  1.26* 0.93*  --   --   --   --   --   --   < > = values in this interval not displayed.  Estimated Creatinine Clearance: 21 mL/min (by C-G formula based on Cr of 2.05).  Assessment: 82 y.o. F on heparin for r/o ACS. Patient also has Afib with CHA2DS2VASc = 7. Heparin level continues to be therapeutic. No bleeding noted. Hgb stable 11.5.  Goal of Therapy:  Heparin level 0.3-0.7 units/ml Monitor platelets by anticoagulation protocol: Yes   Plan:  Continue heparin gtt at 1550 units/hr Daily heparin level and CBC  Sherron MondayAubrey N. Aiman Noe, PharmD Clinical Pharmacy Resident Pager: [phone] 12/21/2015 9:43 AM

## 2015-12-21 NOTE — Progress Notes (Signed)
Cardiology paged regarding orders for cath. Advised to keep pt NPO after midnight.

## 2015-12-21 NOTE — Progress Notes (Signed)
Admit: 12/18/2015 LOS: 3  45F admit with AFib with RVR new onset, NSTEMI, hx/o KT 2003 on Tac/MMF. Rate control with dilitiazem  Subjective:  Now with pneumococcal PNA and bacteremia Developed hypoxic RF yesterday on [place] Tac Level 5/20 pending, was a 10h value  05/20 0701 - 05/21 0700 In: 997 [P.O.:480; I.V.:467; IV Piggyback:50] Out: 200 [Urine:200]  Filed Weights   12/19/15 0500 12/20/15 0400 12/21/15 0400  Weight: 73.3 kg (161 lb 9.6 oz) 74.6 kg (164 lb 7.4 oz) 75.6 kg (166 lb 10.7 oz)    Scheduled Meds: . albuterol  2.5 mg Nebulization BID  . allopurinol  100 mg Oral QHS  . antiseptic oral rinse  7 mL Mouth Rinse BID  . aspirin EC  81 mg Oral Daily  . atorvastatin  10 mg Oral q1800  . cefTRIAXone (ROCEPHIN)  IV  2 g Intravenous Q24H  . Chlorhexidine Gluconate Cloth  6 each Topical Q0600  . cinacalcet  30 mg Oral Q supper  . diltiazem  240 mg Oral Daily  . famotidine  20 mg Oral Daily  . furosemide  80 mg Oral Daily  . guaiFENesin  1,200 mg Oral BID  . insulin aspart  0-15 Units Subcutaneous TID WC  . metoprolol  100 mg Oral BID  . mupirocin ointment  1 application Nasal BID  . mycophenolate  500 mg Oral BID  . sodium chloride flush  3 mL Intravenous Q12H  . tacrolimus  2 mg Oral QHS  . tacrolimus  2 mg Oral q morning - 10a   Continuous Infusions: . sodium chloride 20 mL/hr at 12/21/15 0800  . diltiazem (CARDIZEM) infusion Stopped (12/21/15 0800)  . heparin 1,550 Units/hr (12/21/15 0800)   PRN Meds:.sodium chloride, acetaminophen, nitroGLYCERIN, ondansetron (ZOFRAN) IV, senna-docusate, sodium chloride flush, traMADol  Current Labs: reviewed    Physical Exam:  Blood pressure 100/62, pulse 104, temperature 97.5 F (36.4 C), temperature source Oral, resp. rate 26, height 5\' 3"  (1.6 m), weight 75.6 kg (166 lb 10.7 oz), SpO2 93 %. GEN: NAD ENT: NCAT EYES: EOMI CV: IR, normal rate, no rub, nl s1s2 PULM: ctab, nl wob ABD: s/nt/nd SKIN: no rashes/lesions EXT:No  LEE   Assessment   1. S/p Kid Tplt 2003 on Tac/MMF, BL SCr 1.0-1.4, sees Mattingly at [facility]  1. outpt tac is 3mg  qAM 2mg  qPM 2. Empiric dose reduced to 2mg  BID with initiation of dilitiazem 3. Following tac levels as able 2. AoCKD 1. DDx: CAP + NSTEMI + AFib/RVR; CNI supratherapeutic -- BP Ok 2. Await tac values, if cont to increase consider further dose reduction (but only on MMF) 3. AFib with RVR, new onsent 4. NSTEMI, known CAD 5. CAP on ceftriaxone/azithro: pneumococcus 6. Pneumococcal Bacteremia 7. HTN 8. HLD 9. Chronic dCHF  Plan 1. Hold AM lasix today 2. Tac level to be checked daily at 930 to 10am 3. cont tacrolimus dosing to 2 mg twice daily 4. Continue mycophenolate at current dosing 5. Will closely follow   Sabra Heckyan Heidy Mccubbin MD 12/21/2015, 8:56 AM   Recent Labs Lab 12/19/15 0242 12/20/15 0800 12/21/15 0252  NA 134* 131* 129*  K 3.8 3.7 3.9  CL 96* 96* 95*  CO2 24 22 17*  GLUCOSE 132* 166* 147*  BUN 28* 36* 47*  CREATININE 1.51* 1.54* 2.05*  CALCIUM 10.0 9.9 9.0    Recent Labs Lab 12/18/15 0830 12/19/15 0242 12/20/15 0800 12/21/15 0252  WBC 22.0* 16.0* 14.9* 14.6*  NEUTROABS 19.2*  --   --   --  HGB 12.7 11.3* 11.8* 11.5*  HCT 39.4 35.2* 35.9* 36.2  MCV 87.8 87.8 87.1 88.7  PLT 190 216 244 280

## 2015-12-21 NOTE — Progress Notes (Signed)
Bedside u/s done.  Small effusion on Rt, minimal on Lt.  Defer thoracentesis.  F/u CXR 5/22.  Coralyn HellingVineet Elfie Costanza, MD [facility] 12/21/2015, 2:46 PM Pager:  [phone] After 3pm call: [phone]

## 2015-12-21 NOTE — Progress Notes (Signed)
PULMONARY / CRITICAL CARE MEDICINE   Name: [name] MRN: [id number] DOB: [birth year]    ADMISSION DATE:  12/18/2015 CONSULTATION DATE:  12/20/2015  REFERRING MD:  Dr. Anne Fu  CHIEF COMPLAINT:  Short of breath  SUBJECTIVE:  Still has cough.  Feels like neb tx helps.  VITAL SIGNS: BP 95/71 mmHg  Pulse 101  Temp(Src) 97.4 F (36.3 C) (Oral)  Resp 17  Ht  (1.6 m)  Wt 166 lb 10.7 oz (75.6 kg)  BMI 29.53 kg/m2  SpO2 92%  INTAKE / OUTPUT: I/O last 3 completed shifts: In: 1575.3 [P.O.:720; I.V.:755.3; IV Piggyback:100] Out: 300 [Urine:300]  PHYSICAL EXAMINATION: General:  alert Neuro:  Normal strength HEENT:  no stridor Cardiovascular: irregular Lungs:  Bronchial breath sounds with crackles Rt base, no wheeze Abdomen:  Soft, non tender Musculoskeletal:  No edema Skin:  No rashes  LABS:  BMET  Recent Labs Lab 12/19/15 0242 12/20/15 0800 12/21/15 0252  NA 134* 131* 129*  K 3.8 3.7 3.9  CL 96* 96* 95*  CO2 24 22 17*  BUN 28* 36* 47*  CREATININE 1.51* 1.54* 2.05*  GLUCOSE 132* 166* 147*    Electrolytes  Recent Labs Lab 12/18/15 1330 12/19/15 0242 12/20/15 0800 12/21/15 0252  CALCIUM  --  10.0 9.9 9.0  MG 2.0  --   --   --     CBC  Recent Labs Lab 12/19/15 0242 12/20/15 0800 12/21/15 0252  WBC 16.0* 14.9* 14.6*  HGB 11.3* 11.8* 11.5*  HCT 35.2* 35.9* 36.2  PLT 216 244 280    Coag's No results for input(s): APTT, INR in the last 168 hours.  Sepsis Markers  Recent Labs Lab 12/18/15 1055  LATICACIDVEN 1.48    ABG  Recent Labs Lab 12/20/15 1109  PHART 7.474*  PCO2ART 25.3*  PO2ART 69.1*    Liver Enzymes  Recent Labs Lab 12/18/15 0830  AST 22  ALT 10*  ALKPHOS 99  BILITOT 2.0*  ALBUMIN 2.8*    Cardiac Enzymes  Recent Labs Lab 12/18/15 1330 12/18/15 1822 12/19/15 0124  TROPONINI 1.04* 1.26* 0.93*    Glucose  Recent Labs Lab 12/19/15 1805 12/19/15 2120 12/20/15 0748 12/20/15 1227 12/20/15 1612  12/20/15 2138  GLUCAP 137* 132* 168* 150* 166* 143*    Imaging Dg Chest Port 1 View  12/20/2015  CLINICAL DATA:  Shortness of breath EXAM: PORTABLE CHEST 1 VIEW COMPARISON:  12/18/2015 FINDINGS: New right lower lung opacity, likely combination of atelectasis/ pneumonia and layering pleural effusion. Mild left basilar opacity, possibly atelectasis. Cardiomegaly.  No frank interstitial edema. No pneumothorax. IMPRESSION: New right lower lung opacity, likely combination of atelectasis/ pneumonia and layering pleural effusion. Mild left basilar opacity, possibly atelectasis. Electronically Signed   By: Charline Bills [name]   On: 12/20/2015 11:53     STUDIES:  5/19 Echo >> EF 55 to 60%, severe LVH  CULTURES: 5/18 Blood >> Pneumococcus  ANTIBIOTICS: 5/18 Zithromax >> 5/18 5/18 Rocephin >>  SIGNIFICANT EVENTS: 5/18 Admit  LINES/TUBES:  DISCUSSION: 82 yo female with pneumococcal PNA with bacteremia and acute hypoxic respiratory failure.  She also has A fib with RVR, NSTEMI from demand ischemia.  She has hx of Stage III CKD s/p renal transplant, DM, chronic diastolic CHF, HLD  ASSESSMENT / PLAN:  PULMONARY A: Acute hypoxic respiratory failure 2nd to CAP. Rt pleural effusion >> some increase in size on CXR 5/21. P:   Oxygen to keep SpO2 > 92% F/u CXR Bronchial hygiene, BDs Will assess  with bedside u/s for thoracentesis  CARDIOVASCULAR A:  A fib. NSTEMI. Chronic diastolic CHF, HLD. P:  Per cardiology  RENAL A:   CKD 3 s/p renal transplant. Hyponatremia. P:   Monitor renal fx Continue prograf, cellcept F/u with renal  GASTROINTESTINAL A:   Nutrition. P:   Carb modified diet  HEMATOLOGIC A:   Leukocytosis. P:  F/u CBC  INFECTIOUS A:   Pneumococcal PNA with bacteremia. P:   Day 4 rocephin  ENDOCRINE A:   DM type II.   P:   SSI  NEUROLOGIC A:   No acute issues. P:   Monitor mental status  Coralyn HellingVineet Vance Hochmuth, MD [facility]  [facility] 12/21/2015, 7:19 AM Pager:  [phone] After 3pm call: [phone]

## 2015-12-22 ENCOUNTER — Inpatient Hospital Stay (HOSPITAL_COMMUNITY): Payer: Medicare Other

## 2015-12-22 DIAGNOSIS — J189 Pneumonia, unspecified organism: Secondary | ICD-10-CM

## 2015-12-22 DIAGNOSIS — J90 Pleural effusion, not elsewhere classified: Secondary | ICD-10-CM | POA: Insufficient documentation

## 2015-12-22 DIAGNOSIS — I5031 Acute diastolic (congestive) heart failure: Secondary | ICD-10-CM

## 2015-12-22 DIAGNOSIS — R7881 Bacteremia: Secondary | ICD-10-CM

## 2015-12-22 DIAGNOSIS — I2510 Atherosclerotic heart disease of native coronary artery without angina pectoris: Secondary | ICD-10-CM

## 2015-12-22 DIAGNOSIS — Z9861 Coronary angioplasty status: Secondary | ICD-10-CM

## 2015-12-22 LAB — CBC
HEMATOCRIT: 35.1 % — AB (ref 36.0–46.0)
Hemoglobin: 11.6 g/dL — ABNORMAL LOW (ref 12.0–15.0)
MCH: 28.4 pg (ref 26.0–34.0)
MCHC: 33 g/dL (ref 30.0–36.0)
MCV: 85.8 fL (ref 78.0–100.0)
PLATELETS: 309 10*3/uL (ref 150–400)
RBC: 4.09 MIL/uL (ref 3.87–5.11)
RDW: 13.8 % (ref 11.5–15.5)
WBC: 13.2 10*3/uL — AB (ref 4.0–10.5)

## 2015-12-22 LAB — GLUCOSE, CAPILLARY
GLUCOSE-CAPILLARY: 115 mg/dL — AB (ref 65–99)
GLUCOSE-CAPILLARY: 134 mg/dL — AB (ref 65–99)
Glucose-Capillary: 109 mg/dL — ABNORMAL HIGH (ref 65–99)
Glucose-Capillary: 126 mg/dL — ABNORMAL HIGH (ref 65–99)

## 2015-12-22 LAB — BASIC METABOLIC PANEL
Anion gap: 15 (ref 5–15)
BUN: 59 mg/dL — ABNORMAL HIGH (ref 6–20)
CALCIUM: 8.9 mg/dL (ref 8.9–10.3)
CO2: 19 mmol/L — ABNORMAL LOW (ref 22–32)
CREATININE: 2.32 mg/dL — AB (ref 0.44–1.00)
Chloride: 94 mmol/L — ABNORMAL LOW (ref 101–111)
GFR, EST AFRICAN AMERICAN: 22 mL/min — AB (ref >60)
GFR, EST NON AFRICAN AMERICAN: 19 mL/min — AB (ref >60)
Glucose, Bld: 137 mg/dL — ABNORMAL HIGH (ref 65–99)
Potassium: 3.9 mmol/L (ref 3.5–5.1)
SODIUM: 128 mmol/L — AB (ref 135–145)

## 2015-12-22 LAB — HEMOGLOBIN A1C
Hgb A1c MFr Bld: 6.9 % — ABNORMAL HIGH (ref 4.8–5.6)
Mean Plasma Glucose: 151 mg/dL

## 2015-12-22 LAB — HEPARIN LEVEL (UNFRACTIONATED): Heparin Unfractionated: 0.59 IU/mL (ref 0.30–0.70)

## 2015-12-22 MED ORDER — FUROSEMIDE 10 MG/ML IJ SOLN
80.0000 mg | Freq: Once | INTRAMUSCULAR | Status: AC
  Administered 2015-12-22: 80 mg via INTRAVENOUS
  Filled 2015-12-22: qty 8

## 2015-12-22 MED ORDER — ALBUTEROL SULFATE (2.5 MG/3ML) 0.083% IN NEBU
2.5000 mg | INHALATION_SOLUTION | Freq: Four times a day (QID) | RESPIRATORY_TRACT | Status: DC | PRN
Start: 2015-12-22 — End: 2016-01-01
  Administered 2015-12-22: 2.5 mg via RESPIRATORY_TRACT

## 2015-12-22 MED ORDER — ALBUMIN HUMAN 25 % IV SOLN
25.0000 g | Freq: Once | INTRAVENOUS | Status: AC
  Administered 2015-12-22: 25 g via INTRAVENOUS
  Filled 2015-12-22: qty 50

## 2015-12-22 MED ORDER — ACETYLCYSTEINE 20 % IN SOLN
4.0000 mL | Freq: Two times a day (BID) | RESPIRATORY_TRACT | Status: DC
Start: 2015-12-22 — End: 2015-12-23
  Administered 2015-12-22 – 2015-12-23 (×3): 4 mL via RESPIRATORY_TRACT
  Filled 2015-12-22 (×3): qty 4

## 2015-12-22 MED ORDER — ACETYLCYSTEINE 20 % IN SOLN: 4.0000 mL | Freq: Two times a day (BID) | RESPIRATORY_TRACT | Status: DC

## 2015-12-22 MED ORDER — ACETYLCYSTEINE 10 % IN SOLN
4.0000 mL | Freq: Two times a day (BID) | RESPIRATORY_TRACT | Status: DC
  Filled 2015-12-22: qty 4

## 2015-12-22 NOTE — Progress Notes (Signed)
Subjective:  Breathing a little better at rest but still SOB with activity. Converted to NSR  Objective:  Temp:  [97.2 F (36.2 C)-97.5 F (36.4 C)] 97.4 F (36.3 C) (05/22 0400) Pulse Rate:  [38-118] 57 (05/22 0700) Resp:  [16-30] 16 (05/22 0700) BP: (89-118)/(59-84) 118/65 mmHg (05/22 0700) SpO2:  [86 %-100 %] 100 % (05/22 0700) Weight:  [163 lb 5.8 oz (74.1 kg)] 163 lb 5.8 oz (74.1 kg) (05/22 0600) Weight change: -3 lb 4.9 oz (-1.5 kg)  Intake/Output from previous day: 05/21 0701 - 05/22 0700 In: 1673 [I.V.:1623; IV Piggyback:50] Out: 250 [Urine:250]  Intake/Output from this shift: Total I/O In: -  Out: 175 [Urine:175]  Physical Exam: General appearance: alert and no distress Neck: no adenopathy, no carotid bruit, no JVD, supple, symmetrical, trachea midline and thyroid not enlarged, symmetric, no tenderness/mass/nodules Lungs: Decreased BS Right base Heart: regular rate and rhythm, S1, S2 normal, no murmur, click, rub or gallop Extremities: extremities normal, atraumatic, no cyanosis or edema  Lab Results: Results for orders placed or performed during the hospital encounter of 12/18/15 (from the past 48 hour(s))  Blood gas, arterial     Status: Abnormal   Collection Time: 12/20/15 11:09 AM  Result Value Ref Range   FIO2 0.55    O2 Content 14.0 L/min   Delivery systems VENTURI MASK    pH, Arterial 7.474 (H) 7.350 - 7.450   pCO2 arterial 25.3 (L) 35.0 - 45.0 mmHg   pO2, Arterial 69.1 (L) 80.0 - 100.0 mmHg   Bicarbonate 18.4 (L) 20.0 - 24.0 mEq/L   TCO2 19.2 0 - 100 mmol/L   Acid-base deficit 4.6 (H) 0.0 - 2.0 mmol/L   O2 Saturation 94.2 %   Patient temperature 98.6    Collection site RIGHT RADIAL    Drawn by [id number]    Sample type ARTERIAL DRAW    Allens test (pass/fail) PASS PASS  Glucose, capillary     Status: Abnormal   Collection Time: 12/20/15 12:27 PM  Result Value Ref Range   Glucose-Capillary 150 (H) 65 - 99 mg/dL   Comment 1 Capillary  Specimen    Comment 2 Notify RN   Heparin level (unfractionated)     Status: None   Collection Time: 12/20/15  3:50 PM  Result Value Ref Range   Heparin Unfractionated 0.64 0.30 - 0.70 IU/mL    Comment:        IF HEPARIN RESULTS ARE BELOW EXPECTED VALUES, AND PATIENT DOSAGE HAS BEEN CONFIRMED, SUGGEST FOLLOW UP TESTING OF ANTITHROMBIN III LEVELS.   Glucose, capillary     Status: Abnormal   Collection Time: 12/20/15  4:12 PM  Result Value Ref Range   Glucose-Capillary 166 (H) 65 - 99 mg/dL   Comment 1 Capillary Specimen    Comment 2 Notify RN   Glucose, capillary     Status: Abnormal   Collection Time: 12/20/15  9:38 PM  Result Value Ref Range   Glucose-Capillary 143 (H) 65 - 99 mg/dL   Comment 1 Capillary Specimen   CBC     Status: Abnormal   Collection Time: 12/21/15  2:52 AM  Result Value Ref Range   WBC 14.6 (H) 4.0 - 10.5 K/uL   RBC 4.08 3.87 - 5.11 MIL/uL   Hemoglobin 11.5 (L) 12.0 - 15.0 g/dL   HCT 36.2 36.0 - 46.0 %   MCV 88.7 78.0 - 100.0 fL   MCH 28.2 26.0 - 34.0 pg   MCHC 31.8 30.0 -  36.0 g/dL   RDW 13.8 11.5 - 15.5 %   Platelets 280 150 - 400 K/uL  Heparin level (unfractionated)     Status: None   Collection Time: 12/21/15  2:52 AM  Result Value Ref Range   Heparin Unfractionated 0.65 0.30 - 0.70 IU/mL    Comment:        IF HEPARIN RESULTS ARE BELOW EXPECTED VALUES, AND PATIENT DOSAGE HAS BEEN CONFIRMED, SUGGEST FOLLOW UP TESTING OF ANTITHROMBIN III LEVELS.   Basic metabolic panel     Status: Abnormal   Collection Time: 12/21/15  2:52 AM  Result Value Ref Range   Sodium 129 (L) 135 - 145 mmol/L   Potassium 3.9 3.5 - 5.1 mmol/L   Chloride 95 (L) 101 - 111 mmol/L   CO2 17 (L) 22 - 32 mmol/L   Glucose, Bld 147 (H) 65 - 99 mg/dL   BUN 47 (H) 6 - 20 mg/dL   Creatinine, Ser 2.05 (H) 0.44 - 1.00 mg/dL   Calcium 9.0 8.9 - 10.3 mg/dL   GFR calc non Af Amer 22 (L) >60 mL/min   GFR calc Af Amer 25 (L) >60 mL/min    Comment: (NOTE) The eGFR has been  calculated using the CKD EPI equation. This calculation has not been validated in all clinical situations. eGFR's persistently <60 mL/min signify possible Chronic Kidney Disease.    Anion gap 17 (H) 5 - 15  Glucose, capillary     Status: Abnormal   Collection Time: 12/21/15  8:22 AM  Result Value Ref Range   Glucose-Capillary 128 (H) 65 - 99 mg/dL  Glucose, capillary     Status: Abnormal   Collection Time: 12/21/15 12:18 PM  Result Value Ref Range   Glucose-Capillary 140 (H) 65 - 99 mg/dL  Glucose, capillary     Status: Abnormal   Collection Time: 12/21/15  3:49 PM  Result Value Ref Range   Glucose-Capillary 137 (H) 65 - 99 mg/dL  Glucose, capillary     Status: Abnormal   Collection Time: 12/21/15  9:08 PM  Result Value Ref Range   Glucose-Capillary 149 (H) 65 - 99 mg/dL  CBC     Status: Abnormal   Collection Time: 12/22/15  2:43 AM  Result Value Ref Range   WBC 13.2 (H) 4.0 - 10.5 K/uL   RBC 4.09 3.87 - 5.11 MIL/uL   Hemoglobin 11.6 (L) 12.0 - 15.0 g/dL   HCT 35.1 (L) 36.0 - 46.0 %   MCV 85.8 78.0 - 100.0 fL   MCH 28.4 26.0 - 34.0 pg   MCHC 33.0 30.0 - 36.0 g/dL   RDW 13.8 11.5 - 15.5 %   Platelets 309 150 - 400 K/uL  Heparin level (unfractionated)     Status: None   Collection Time: 12/22/15  2:43 AM  Result Value Ref Range   Heparin Unfractionated 0.59 0.30 - 0.70 IU/mL    Comment:        IF HEPARIN RESULTS ARE BELOW EXPECTED VALUES, AND PATIENT DOSAGE HAS BEEN CONFIRMED, SUGGEST FOLLOW UP TESTING OF ANTITHROMBIN III LEVELS.   Basic metabolic panel     Status: Abnormal   Collection Time: 12/22/15  2:43 AM  Result Value Ref Range   Sodium 128 (L) 135 - 145 mmol/L   Potassium 3.9 3.5 - 5.1 mmol/L   Chloride 94 (L) 101 - 111 mmol/L   CO2 19 (L) 22 - 32 mmol/L   Glucose, Bld 137 (H) 65 - 99 mg/dL   BUN 59 (  H) 6 - 20 mg/dL   Creatinine, Ser 2.32 (H) 0.44 - 1.00 mg/dL   Calcium 8.9 8.9 - 10.3 mg/dL   GFR calc non Af Amer 19 (L) >60 mL/min   GFR calc Af Amer 22  (L) >60 mL/min    Comment: (NOTE) The eGFR has been calculated using the CKD EPI equation. This calculation has not been validated in all clinical situations. eGFR's persistently <60 mL/min signify possible Chronic Kidney Disease.    Anion gap 15 5 - 15    Imaging: Imaging results have been reviewed, Increasing Right pleural effusion. I have personally reviewed  Tele- NSR in 60s    Assessment/Plan:   1. Active Problems: 2.   S/P kidney transplant 3.   Diastolic CHF ([place]) 4.   CAD S/P remote RCA PCI 5.   A-fib ([place]) 6.   Community acquired pneumonia 7.   Time Spent Directly with Patient:  20 minutes  Length of Stay:  LOS: 4 days   Pt admitted 5/17 with CP and SOB found to be in AFIB with RVR and Pneumococcal PNA. Her LV Fxn is nl. H/O Prior BMS 2002. Trop mildly elevated 1.26 (prob demand ischemia ) and BNP mod elevated at 1200. She has converted to NSR. On ATBX. PCCM following. CXR shows worsening Right pleural effusion. ? Benefit of thoracentesis. I have discussed with Dr. Halford Chessman. Lasix on hold (was on 80 mg at home). SCr has progressively increased as has BUN. I suspect she is dry. I/O + 6L. Given that she is s/p renal transplant I am not enthusiastic about cardiac cath but would rather risk stratify with Lexiscan at some point. Will ask [facility] to get involved . On IV hep. Will ultimately require initiation of NOAC ( The CHA2DSVASC2 score is )   .  Quay Burow 12/22/2015, 8:16 AM

## 2015-12-22 NOTE — Progress Notes (Signed)
ANTICOAGULATION CONSULT NOTE - Follow Up Consult  Pharmacy Consult for Heparin Indication: elevated troponins, Afib  Allergies  Allergen Reactions  . Sulfa Drugs Cross Reactors Swelling    Patient Measurements: Height: 5\' 3"  (160 cm) Weight: 163 lb 5.8 oz (74.1 kg) IBW/kg (Calculated) : 52.4 Heparin Dosing Weight: 68 kg  Vital Signs: Temp: 97.4 F (36.3 C) (05/22 0400) Temp Source: Oral (05/22 0400) BP: 118/65 mmHg (05/22 0700) Pulse Rate: 57 (05/22 0700)  Labs:  Recent Labs  12/20/15 0800 12/20/15 1550 12/21/15 0252 12/22/15 0243  HGB 11.8*  --  11.5* 11.6*  HCT 35.9*  --  36.2 35.1*  PLT 244  --  280 309  HEPARINUNFRC 0.28* 0.64 0.65 0.59  CREATININE 1.54*  --  2.05* 2.32*    Estimated Creatinine Clearance: 18.3 mL/min (by C-G formula based on Cr of 2.32).  Assessment: 82 y.o. F initially started on heparin for r/o ACS, then continued for new onset Afib with CHA2DS2VASc = 7. Heparin level continues to be therapeutic. No bleeding noted. Hgb stable, plts wnl.  Goal of Therapy:  Heparin level 0.3-0.7 units/ml Monitor platelets by anticoagulation protocol: Yes   Plan:  Continue heparin gtt at 1550 units/hr Daily heparin level and CBC Monitor for s/sx bleeding  Greggory Stallionristy Reyes, PharmD Clinical Pharmacy Resident Pager # [phone] 12/22/2015 8:12 AM

## 2015-12-22 NOTE — Consult Note (Signed)
Medical Consultation   [name]  ZOX:[id number]  DOB: [birth year]  DOA: 12/18/2015  PCP: No primary care provider on file.    Requesting physician: Dr. Allyson Sabal, Cards Reason for consultation: To assume care of patient   History of Present Illness: [name] is an 82 y.o. female with a history of CAD, HTN, DM2, CKD s/p renal transplant in 2003, admitted on 5/18 after presenting with shortness of breath and chest pain, found to have A fib with RVR (CHADVASC 7), as well as NSTEMI from demand ischemia. Cardiology was entertaining cardiac catheterization on this patient but she developed other medical issues which forbade her from undergoing procedure, including pneumococcal PNA with acute hypoxic respiratory failure with small right pleural effusion requiring CCM involvement.  At this time, Shortness of breath is improving with diuresis and after thoracentesis, but it is worse with ambulation.  In addition, her Creatinine continues to be worsening, requiring Renal involvement.  Denies fevers, chills, night sweats, vision changes, or mucositis. Denies any chest pain or palpitations after converting to NSR on 5/22. Denies lower extremity swelling. Denies nausea, heartburn or change in bowel habits.  Denies abdominal pain. Appetite is decreased. Denies any dysuria. Denies abnormal skin rashes, or neuropathy. Denies any bleeding issues such as epistaxis, hematemesis, hematuria or hematochezia. Has not been able to ambulate due to her shortness of breath. [facility] has been requested to get involved in her care    Review of Systems:  As per HPI otherwise 10 point review of systems negative.     Past Medical History: Past Medical History  Diagnosis Date  . S/P kidney transplant     a. due to FSGN, follows with Dr. Vivia Birmingham in 2003. Was on HD prior to that.  . Coronary artery disease     a. 01/2001 Cath/PCI: LAD 50p, 40m, D1 50-60, RCA 44m (3.0x13 BX Velocity  BMS).  . Hypertensive heart disease   . Cholelithiasis   . Chronic diastolic CHF (congestive heart failure) (HCC)     a. 06/2011 Echo: EF 60-65%, no rwma, Gr1 DD, mild TR.  Marland Kitchen Gout     unconfirmed by joint aspiration  . CKD (chronic kidney disease), stage III     a. in setting of prior ESRD and cadaveric tx in 2003.  . Candidal esophagitis (HCC)     a. 03/2014.  Marland Kitchen Gastritis     a. 03/2014  . Type II diabetes mellitus (HCC)   . History of bacteremia     a. 08/2004 Group A Strep bacteremia.    Past Surgical History: Past Surgical History  Procedure Laterality Date  . Kidney transplant  2003  . Abdominal hysterectomy    . Esophagogastroduodenoscopy N/A 03/20/2014    Procedure: ESOPHAGOGASTRODUODENOSCOPY (EGD);  Surgeon: Theda Belfast, MD;  Location: [facility] ENDOSCOPY;  Service: Endoscopy;  Laterality: N/A;     Allergies:   Allergies  Allergen Reactions  . Sulfa Drugs Cross Reactors Swelling     Social History:  reports that she quit smoking about 46 years ago. She has never used smokeless tobacco. She reports that she does not drink alcohol or use illicit drugs. Walks with assistance at home   Family History: Family History  Problem Relation Age of Onset  . CAD Father   . CAD Brother   . CAD      Both parents and multiple siblings have died from coronary disease prior  to age 37 and several in their 35's and 23's.    Family history reviewed and not pertinent    Physical Exam: Filed Vitals:   12/22/15 0600 12/22/15 0700 12/22/15 0800 12/22/15 0910  BP:  118/65    Pulse: 63 57    Temp:   98 F (36.7 C)   TempSrc:   Oral   Resp: 27 16    Height:      Weight: 74.1 kg (163 lb 5.8 oz)     SpO2: 86% 100%  96%    Constitutional: Appears calm,  alert and awake, oriented x3, not in any acute distress. Eyes: PERLA, EOMI, irises appear normal, anicteric sclera,  ENMT: external ears and nose appear normal, normal hearing or hard of hearing. Lips appears normal, oropharynx  mucosa, tongue, posterior pharynx appear normal . Several missing teeth Neck: neck appears normal, no masses, normal ROM, no thyromegaly, + JVD 3 cm CVS: RRR with occasional ectopic beat S1-S2 clear, no murmur rubs or gallops, trace LE edema, normal pedal pulses. Veins pronounced in chest area Respiratory: decreased breath sound at the bases, left base rales, no wheezing  rhonchi. Respiratory effort normal. No accessory muscle use.  Abdomen: soft nontender, nondistended, normal bowel sounds, no hepatosplenomegaly, no hernias  Musculoskeletal: no cyanosis, clubbing or edema noted bilaterally. Joint/bones/muscle exam, strength, contractures or atrophy Neuro: Cranial nerves II-XII intact, strength, sensation, reflexes Psych: judgement and insight appear normal, stable mood and affect, mental status Skin: no rashes or lesions or ulcers, no induration or nodules   Data reviewed:  I have personally reviewed following labs and imaging studies Labs:  CBC:  Recent Labs Lab 12/18/15 0830 12/19/15 0242 12/20/15 0800 12/21/15 0252 12/22/15 0243  WBC 22.0* 16.0* 14.9* 14.6* 13.2*  NEUTROABS 19.2*  --   --   --   --   HGB 12.7 11.3* 11.8* 11.5* 11.6*  HCT 39.4 35.2* 35.9* 36.2 35.1*  MCV 87.8 87.8 87.1 88.7 85.8  PLT 190 216 244 280 309    Basic Metabolic Panel:  Recent Labs Lab 12/18/15 0830 12/18/15 1330 12/19/15 0242 12/20/15 0800 12/21/15 0252 12/22/15 0243  NA 134*  --  134* 131* 129* 128*  K 3.9  --  3.8 3.7 3.9 3.9  CL 98*  --  96* 96* 95* 94*  CO2 20*  --  24 22 17* 19*  GLUCOSE 166*  --  132* 166* 147* 137*  BUN 21*  --  28* 36* 47* 59*  CREATININE 1.35*  --  1.51* 1.54* 2.05* 2.32*  CALCIUM 10.0  --  10.0 9.9 9.0 8.9  MG  --  2.0  --   --   --   --    GFR Estimated Creatinine Clearance: 18.3 mL/min (by C-G formula based on Cr of 2.32). Liver Function Tests:  Recent Labs Lab 12/18/15 0830  AST 22  ALT 10*  ALKPHOS 99  BILITOT 2.0*  PROT 6.7  ALBUMIN 2.8*   No  results for input(s): LIPASE, AMYLASE in the last 168 hours. No results for input(s): AMMONIA in the last 168 hours. Coagulation profile No results for input(s): INR, PROTIME in the last 168 hours.  Cardiac Enzymes:  Recent Labs Lab 12/18/15 0830 12/18/15 1330 12/18/15 1822 12/19/15 0124  TROPONINI 0.98* 1.04* 1.26* 0.93*   BNP: Invalid input(s): POCBNP CBG:  Recent Labs Lab 12/21/15 0822 12/21/15 1218 12/21/15 1549 12/21/15 2108 12/22/15 0832  GLUCAP 128* 140* 137* 149* 115*   D-Dimer No results for input(s):  DDIMER in the last 72 hours. Hgb A1c No results for input(s): HGBA1C in the last 72 hours. Lipid Profile No results for input(s): CHOL, HDL, LDLCALC, TRIG, CHOLHDL, LDLDIRECT in the last 72 hours. Thyroid function studies No results for input(s): TSH, T4TOTAL, T3FREE, THYROIDAB in the last 72 hours.  Invalid input(s): FREET3 Anemia work up No results for input(s): VITAMINB12, FOLATE, FERRITIN, TIBC, IRON, RETICCTPCT in the last 72 hours. Urinalysis    Component Value Date/Time   COLORURINE YELLOW 08/13/2014 2317   APPEARANCEUR CLEAR 08/13/2014 2317   LABSPEC 1.014 08/13/2014 2317   PHURINE 5.0 08/13/2014 2317   GLUCOSEU NEGATIVE 08/13/2014 2317   HGBUR NEGATIVE 08/13/2014 2317   BILIRUBINUR NEGATIVE 08/13/2014 2317   KETONESUR NEGATIVE 08/13/2014 2317   PROTEINUR NEGATIVE 08/13/2014 2317   UROBILINOGEN 1.0 08/13/2014 2317   NITRITE NEGATIVE 08/13/2014 2317   LEUKOCYTESUR SMALL* 08/13/2014 2317     Sepsis Labs Invalid input(s): PROCALCITONIN,  WBC,  LACTICIDVEN Microbiology Recent Results (from the past 240 hour(s))  Culture, blood (Routine X 2) w Reflex to ID Panel     Status: Abnormal (Preliminary result)   Collection Time: 12/18/15 10:49 AM  Result Value Ref Range Status   Specimen Description BLOOD RIGHT HAND  Final   Special Requests BOTTLES DRAWN AEROBIC AND ANAEROBIC  5CC  Final   Culture  Setup Time   Final    GRAM POSITIVE COCCI IN  CHAINS IN PAIRS IN BOTH AEROBIC AND ANAEROBIC BOTTLES Organism ID to follow CRITICAL RESULT CALLED TO, READ BACK BY AND VERIFIED WITH: CARON AMEND,PHARMD@0050  12/19/15 MKELLY    Culture (A)  Final    STREPTOCOCCUS PNEUMONIAE SUSCEPTIBILITIES TO FOLLOW    Report Status PENDING  Incomplete  Blood Culture ID Panel (Reflexed)     Status: Abnormal   Collection Time: 12/18/15 10:49 AM  Result Value Ref Range Status   Enterococcus species NOT DETECTED NOT DETECTED Final   Vancomycin resistance NOT DETECTED NOT DETECTED Final   Listeria monocytogenes NOT DETECTED NOT DETECTED Final   Staphylococcus species NOT DETECTED NOT DETECTED Final   Staphylococcus aureus NOT DETECTED NOT DETECTED Final   Methicillin resistance NOT DETECTED NOT DETECTED Final   Streptococcus species NOT DETECTED NOT DETECTED Final   Streptococcus agalactiae NOT DETECTED NOT DETECTED Final   Streptococcus pneumoniae DETECTED (A) NOT DETECTED Final    Comment: CRITICAL RESULT CALLED TO, READ BACK BY AND VERIFIED WITH: CARON AMEND,PHARMD @0050  12/19/15 MKELLY    Streptococcus pyogenes NOT DETECTED NOT DETECTED Final   Acinetobacter baumannii NOT DETECTED NOT DETECTED Final   Enterobacteriaceae species NOT DETECTED NOT DETECTED Final   Enterobacter cloacae complex NOT DETECTED NOT DETECTED Final   Escherichia coli NOT DETECTED NOT DETECTED Final   Klebsiella oxytoca NOT DETECTED NOT DETECTED Final   Klebsiella pneumoniae NOT DETECTED NOT DETECTED Final   Proteus species NOT DETECTED NOT DETECTED Final   Serratia marcescens NOT DETECTED NOT DETECTED Final   Carbapenem resistance NOT DETECTED NOT DETECTED Final   Haemophilus influenzae NOT DETECTED NOT DETECTED Final   Neisseria meningitidis NOT DETECTED NOT DETECTED Final   Pseudomonas aeruginosa NOT DETECTED NOT DETECTED Final   Candida albicans NOT DETECTED NOT DETECTED Final   Candida glabrata NOT DETECTED NOT DETECTED Final   Candida krusei NOT DETECTED NOT  DETECTED Final   Candida parapsilosis NOT DETECTED NOT DETECTED Final   Candida tropicalis NOT DETECTED NOT DETECTED Final  Culture, blood (Routine X 2) w Reflex to ID Panel     Status:  Abnormal   Collection Time: 12/18/15 10:59 AM  Result Value Ref Range Status   Specimen Description BLOOD LEFT ANTECUBITAL  Final   Special Requests BOTTLES DRAWN AEROBIC AND ANAEROBIC  5CC  Final   Culture  Setup Time   Final    GRAM POSITIVE COCCI IN PAIRS IN CHAINS IN BOTH AEROBIC AND ANAEROBIC BOTTLES CRITICAL RESULT CALLED TO, READ BACK BY AND VERIFIED WITH: CARON AMEND,PHARMD @0050  12/19/15 KELLYM    Culture (A)  Final    STREPTOCOCCUS PNEUMONIAE SUSCEPTIBILITIES PERFORMED ON PREVIOUS CULTURE WITHIN THE LAST 5 DAYS.    Report Status 12/21/2015 FINAL  Final  MRSA PCR Screening     Status: Abnormal   Collection Time: 12/18/15  9:20 PM  Result Value Ref Range Status   MRSA by PCR POSITIVE (A) NEGATIVE Final    Comment:        The GeneXpert MRSA Assay (FDA approved for NASAL specimens only), is one component of a comprehensive MRSA colonization surveillance program. It is not intended to diagnose MRSA infection nor to guide or monitor treatment for MRSA infections. RESULT CALLED TO, READ BACK BY AND VERIFIED WITH: S ROUSE,RN @0302  12/19/15 MKELLY        Inpatient Medications:   Scheduled Meds: . albuterol  2.5 mg Nebulization BID  . allopurinol  100 mg Oral QHS  . antiseptic oral rinse  7 mL Mouth Rinse BID  . aspirin EC  81 mg Oral Daily  . atorvastatin  10 mg Oral q1800  . cefTRIAXone (ROCEPHIN)  IV  2 g Intravenous Q24H  . Chlorhexidine Gluconate Cloth  6 each Topical Q0600  . cinacalcet  30 mg Oral Q supper  . diltiazem  240 mg Oral Daily  . famotidine  20 mg Oral Daily  . guaiFENesin  1,200 mg Oral BID  . insulin aspart  0-15 Units Subcutaneous TID WC  . metoprolol  100 mg Oral BID  . mupirocin ointment  1 application Nasal BID  . mycophenolate  500 mg Oral BID  . sodium  chloride flush  3 mL Intravenous Q12H  . tacrolimus  2 mg Oral QHS  . tacrolimus  2 mg Oral q morning - 10a   Continuous Infusions: . sodium chloride 10 mL/hr at 12/22/15 0800  . diltiazem (CARDIZEM) infusion Stopped (12/21/15 0800)  . heparin 1,550 Units/hr (12/22/15 0800)     Radiological Exams on Admission: Dg Chest Port 1 View  12/22/2015  CLINICAL DATA:  Continued assessment of pleural effusion. EXAM: PORTABLE CHEST 1 VIEW COMPARISON:  12/21/2015. FINDINGS: Cardiomegaly. Large RIGHT pleural effusion is increased. Compressive atelectasis at the RIGHT base. Trace LEFT effusion may be improved. Overall improved vascular congestion. IMPRESSION: Increasing RIGHT pleural effusion otherwise improved exam. Electronically Signed   By: Elsie Stain [name]   On: 12/22/2015 07:27   Dg Chest Port 1 View  12/21/2015  CLINICAL DATA:  Community-acquired pneumonia EXAM: PORTABLE CHEST 1 VIEW COMPARISON:  Chest radiograph from one day prior. FINDINGS: Stable cardiomediastinal silhouette with mild cardiomegaly. No pneumothorax. Moderate right and small left pleural effusions, increased bilaterally. Mild pulmonary edema, increased. Patchy bibasilar lung consolidation, right greater than left, slightly worsened bilaterally. IMPRESSION: 1. Patchy bibasilar lung consolidation, right greater than left, slightly worsened bilaterally, likely a combination of atelectasis and pneumonia. 2. Stable mild cardiomegaly with increased mild pulmonary edema. 3. Moderate right and small left pleural effusions, increased bilaterally. Electronically Signed   By: Delbert Phenix [name]   On: 12/21/2015 08:39   Dg Chest  Port 1 View  12/20/2015  CLINICAL DATA:  Shortness of breath EXAM: PORTABLE CHEST 1 VIEW COMPARISON:  12/18/2015 FINDINGS: New right lower lung opacity, likely combination of atelectasis/ pneumonia and layering pleural effusion. Mild left basilar opacity, possibly atelectasis. Cardiomegaly.  No frank interstitial edema. No  pneumothorax. IMPRESSION: New right lower lung opacity, likely combination of atelectasis/ pneumonia and layering pleural effusion. Mild left basilar opacity, possibly atelectasis. Electronically Signed   By: Charline Bills [name]   On: 12/20/2015 11:53   ECHO 12/19/15: - Left ventricle: The cavity size was normal. Wall thickness was  increased in a pattern of severe LVH. Systolic function was normal. The estimated ejection fraction was in the range of 55% to 60%. Wall motion was normal; there were no regional wall  motion abnormalities. - Mitral valve: There was mild regurgitation. - Left atrium: The atrium was moderately dilated. - Tricuspid valve: There was moderate regurgitation. - Pulmonary arteries: Systolic pressure was moderately increased.  PA peak pressure: 44 mm Hg (S).  Impressions:  - Normal LV systolic function; severe LVH; moderate LAE; mild MR;  moderate TR; moderately elevated pulmonary pressure.   Impression/Recommendations Active Problems:   S/P kidney transplant   Diastolic CHF (HCC)   CAD S/P remote RCA PCI   A-fib [facility])   Community acquired pneumonia  Atrial fibrillation with RVR, now on SR/NSTEMI/Demand Ischemia/CAD: Patient presented with 5 day history of progressive fatigue followed by dyspnea,chest pain and diaphoresis. She was found to have atrial fibrillation on arrival, placed on Cardizem drip, then change to oral Cardizem and metoprolol. 2D echo with normal ejection fraction. She is now on SR. She is also on IV heparin, with plans to switch to oral anticoagulation on discharge Other plans as per cardiology.  Chronic diastolic CHF  2 D echo 5/ 19 Normal LV systolic function; severe LVH; moderate LAE; mild MR; moderate TR; moderately elevated pulmonary pressure.BNP 1232.3  Patient to continue diuresis and cardiac meds as per Cardiology  Hypertension BP 120/105 mmHg  Pulse 66  Temp(Src) 98 F (36.7 C) (Oral)  Resp 22  Ht  (1.6 m)  Wt 74.1  kg (163 lb 5.8 oz)  BMI 28.95 kg/m2  SpO2 98% Blood pressure currently stable, she is to continue on beta blocker as per cardiology  Hyperlipidemia The patient is to continue statins  Chronic kidney disease, stage III, status post prior renal transplant. Current Cr 2.32 (BL 1.5)Patient on Prograf.She is also on diuresis per Nephrology.  Appreciate follow up   Acute respiratory failure with hypoxia/ Pneumococcal pneumonia/ Pleural Effusion Chest x-ray with left base infiltrate. Blood culture positive for strep pneumo. Bedsideultrasound performed, noticing small right pleural effusion with no need for thoracentesis at this time. Patient placed on Rocephin, albuterol nebulizers, Mucinex, mucomyst. Pulmonary toilet Check CT chest without contrast if she develops fever or worsening of her symptoms  Appreciate pulmonary follow-up  Pneumococcal Bacteremia  Blood Culture on 5/18 positive for Strep Pneumonia.Placed on Rocephin, today day 3 of 10 Repeat cultures on 5/22  DM Type 2 Hemoglobin A1c pending. Continue SSI and diabetic diet  Leukocytosis, likely reactive, related to underlying infection. WBC 13.2 (22 on admission)  afebrile -  antibiotics as above -  Repeat WBC in AM  Anemia of chronic disease Hemoglobin 11.6 No transfusion is indicated at this time   Deconditioning PT/OT when able to ambulate   Disposition: Thank you for this consultation.  Our [facility] hospitalist team will assume care on 5/23 .   Time  Spent: 40 mins   [employer] E PA-C [facility] 12/22/2015, 9:22 AM

## 2015-12-22 NOTE — Progress Notes (Signed)
Patient ID: [name], female   DOB: 27-Mar-1932, 82 y.o.   MRN: [id number]  [facility] Progress Note   Assessment/ Plan:   1. AKI on CKD3T: Status post renal transplant in 2003 maintained on Tac/MMF with baseline creatinine between 1.0-1.4. Current acute on chronic renal failure suspected to be hemodynamically mediated from A. fib/RVR versus normotensive ATN with associated community acquired pneumonia. Awaiting recheck of Prograf levels (with empirically reduced dose to 2 mg twice a day) suspecting CYP interaction with diltiazem leading to higher Prograf levels/mild toxicity. Unfortunately, with poor urine output charted overnight in the oliguric range. Retry furosemide given presence of right pleural effusion and pedal edema. 2. A. fib with RVR: She appears to have converted back to normal sinus rhythm and now from intravenous to oral diltiazem. 3. NSTEMI with history of coronary artery disease: This is suspected to have been from demand ischemia but with history of CAD-needs further inquiry. Cardiology favoring Lexiscan over cardiac catheterization to avoid intravenous iodinated contrast exposure. Will ultimately require NOAC.  4. Community acquired pneumonia/right pleural effusion: Optimizing diuretic therapy and on intravenous ceftriaxone. 5. 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  Subjective:   Reports to be feeling poorly with decreased urine output and some shortness of breath/right chest discomfort. Poor appetite.    Objective:   BP 118/65 mmHg  Pulse 57  Temp(Src) 98 F (36.7 C) (Oral)  Resp 16  Ht 5\' 3"  (1.6 m)  Wt 74.1 kg (163 lb 5.8 oz)  BMI 28.95 kg/m2  SpO2 96%  Intake/Output Summary (Last 24 hours) at 12/22/15 [id number] Last data filed at 12/22/15 0800  Gross per 24 hour  Intake    882 ml  Output    425 ml  Net    457 ml   Weight change: -1.5 kg (-3 lb 4.9 oz)  Physical Exam: [id number]:Appears uncomfortable resting in  bed CVS: Pulse regular bradycardia, S1 and S2 normal Resp: Decreased breath sounds right base with fine rales left base Abd: Soft, flat, nontender Ext: Trace to 1+ lower extremity edema  Imaging: Dg Chest Port 1 View  12/22/2015  CLINICAL DATA:  Continued assessment of pleural effusion. EXAM: PORTABLE CHEST 1 VIEW COMPARISON:  12/21/2015. FINDINGS: Cardiomegaly. Large RIGHT pleural effusion is increased. Compressive atelectasis at the RIGHT base. Trace LEFT effusion may be improved. Overall improved vascular congestion. IMPRESSION: Increasing RIGHT pleural effusion otherwise improved exam. Electronically Signed   By: Elsie StainJohn T Curnes [name]   On: 12/22/2015 07:27   Dg Chest Port 1 View  12/21/2015  CLINICAL DATA:  Community-acquired pneumonia EXAM: PORTABLE CHEST 1 VIEW COMPARISON:  Chest radiograph from one day prior. FINDINGS: Stable cardiomediastinal silhouette with mild cardiomegaly. No pneumothorax. Moderate right and small left pleural effusions, increased bilaterally. Mild pulmonary edema, increased. Patchy bibasilar lung consolidation, right greater than left, slightly worsened bilaterally. IMPRESSION: 1. Patchy bibasilar lung consolidation, right greater than left, slightly worsened bilaterally, likely a combination of atelectasis and pneumonia. 2. Stable mild cardiomegaly with increased mild pulmonary edema. 3. Moderate right and small left pleural effusions, increased bilaterally. Electronically Signed   By: Delbert PhenixJason A Poff [name]   On: 12/21/2015 08:39   Dg Chest Port 1 View  12/20/2015  CLINICAL DATA:  Shortness of breath EXAM: PORTABLE CHEST 1 VIEW COMPARISON:  12/18/2015 FINDINGS: New right lower lung opacity, likely combination of atelectasis/ pneumonia and layering pleural effusion. Mild left basilar opacity, possibly atelectasis. Cardiomegaly.  No frank interstitial edema. No pneumothorax. IMPRESSION: New right lower lung  opacity, likely combination of atelectasis/ pneumonia and layering  pleural effusion. Mild left basilar opacity, possibly atelectasis. Electronically Signed   By: Charline Bills [name]   On: 12/20/2015 11:53    Labs: BMET  Recent Labs Lab 12/18/15 0830 12/19/15 0242 12/20/15 0800 12/21/15 0252 12/22/15 0243  NA 134* 134* 131* 129* 128*  K 3.9 3.8 3.7 3.9 3.9  CL 98* 96* 96* 95* 94*  CO2 20* 24 22 17* 19*  GLUCOSE 166* 132* 166* 147* 137*  BUN 21* 28* 36* 47* 59*  CREATININE 1.35* 1.51* 1.54* 2.05* 2.32*  CALCIUM 10.0 10.0 9.9 9.0 8.9   CBC  Recent Labs Lab 12/18/15 0830 12/19/15 0242 12/20/15 0800 12/21/15 0252 12/22/15 0243  WBC 22.0* 16.0* 14.9* 14.6* 13.2*  NEUTROABS 19.2*  --   --   --   --   HGB 12.7 11.3* 11.8* 11.5* 11.6*  HCT 39.4 35.2* 35.9* 36.2 35.1*  MCV 87.8 87.8 87.1 88.7 85.8  PLT 190 216 244 280 309    Medications:    . albuterol  2.5 mg Nebulization BID  . allopurinol  100 mg Oral QHS  . antiseptic oral rinse  7 mL Mouth Rinse BID  . aspirin EC  81 mg Oral Daily  . atorvastatin  10 mg Oral q1800  . cefTRIAXone (ROCEPHIN)  IV  2 g Intravenous Q24H  . Chlorhexidine Gluconate Cloth  6 each Topical Q0600  . cinacalcet  30 mg Oral Q supper  . diltiazem  240 mg Oral Daily  . famotidine  20 mg Oral Daily  . guaiFENesin  1,200 mg Oral BID  . insulin aspart  0-15 Units Subcutaneous TID WC  . metoprolol  100 mg Oral BID  . mupirocin ointment  1 application Nasal BID  . mycophenolate  500 mg Oral BID  . sodium chloride flush  3 mL Intravenous Q12H  . tacrolimus  2 mg Oral QHS  . tacrolimus  2 mg Oral q morning - 10a   Zetta Bills, MD 12/22/2015, 9:29 AM

## 2015-12-22 NOTE — Progress Notes (Addendum)
PULMONARY / CRITICAL CARE MEDICINE   Name: [name] MRN: [id number] DOB: [birth year]    ADMISSION DATE:  12/18/2015 CONSULTATION DATE:  12/20/2015  REFERRING MD:  Dr. Anne FuSkains  CHIEF COMPLAINT:  Short of breath  SUBJECTIVE:  No pressors, no vent 4 liters No distress at rest  VITAL SIGNS: BP 120/105 mmHg  Pulse 66  Temp(Src) 98 F (36.7 C) (Oral)  Resp 22  Ht 5\' 3"  (1.6 m)  Wt 74.1 kg (163 lb 5.8 oz)  BMI 28.95 kg/m2  SpO2 98%  INTAKE / OUTPUT: I/O last 3 completed shifts: In: 1978 [I.V.:1878; IV Piggyback:100] Out: 450 [Urine:450]  PHYSICAL EXAMINATION: General:  alert Neuro:  Normal strength HEENT:  no stridor, jvd Cardiovascular: s1 s2 RRirregular Lungs:  CTA reduced Abdomen:  Soft, non tender Musculoskeletal:  No edema Skin:  No rashes  LABS:  BMET  Recent Labs Lab 12/20/15 0800 12/21/15 0252 12/22/15 0243  NA 131* 129* 128*  K 3.7 3.9 3.9  CL 96* 95* 94*  CO2 22 17* 19*  BUN 36* 47* 59*  CREATININE 1.54* 2.05* 2.32*  GLUCOSE 166* 147* 137*    Electrolytes  Recent Labs Lab 12/18/15 1330  12/20/15 0800 12/21/15 0252 12/22/15 0243  CALCIUM  --   < > 9.9 9.0 8.9  MG 2.0  --   --   --   --   < > = values in this interval not displayed.  CBC  Recent Labs Lab 12/20/15 0800 12/21/15 0252 12/22/15 0243  WBC 14.9* 14.6* 13.2*  HGB 11.8* 11.5* 11.6*  HCT 35.9* 36.2 35.1*  PLT 244 280 309    Coag's No results for input(s): APTT, INR in the last 168 hours.  Sepsis Markers  Recent Labs Lab 12/18/15 1055  LATICACIDVEN 1.48    ABG  Recent Labs Lab 12/20/15 1109  PHART 7.474*  PCO2ART 25.3*  PO2ART 69.1*    Liver Enzymes  Recent Labs Lab 12/18/15 0830  AST 22  ALT 10*  ALKPHOS 99  BILITOT 2.0*  ALBUMIN 2.8*    Cardiac Enzymes  Recent Labs Lab 12/18/15 1330 12/18/15 1822 12/19/15 0124  TROPONINI 1.04* 1.26* 0.93*    Glucose  Recent Labs Lab 12/20/15 2138 12/21/15 0822 12/21/15 1218 12/21/15 1549  12/21/15 2108 12/22/15 0832  GLUCAP 143* 128* 140* 137* 149* 115*    Imaging Dg Chest Port 1 View  12/22/2015  CLINICAL DATA:  Continued assessment of pleural effusion. EXAM: PORTABLE CHEST 1 VIEW COMPARISON:  12/21/2015. FINDINGS: Cardiomegaly. Large RIGHT pleural effusion is increased. Compressive atelectasis at the RIGHT base. Trace LEFT effusion may be improved. Overall improved vascular congestion. IMPRESSION: Increasing RIGHT pleural effusion otherwise improved exam. Electronically Signed   By: Elsie StainJohn T Curnes [name]   On: 12/22/2015 07:27     STUDIES:  5/19 Echo >> EF 55 to 60%, severe LVH  CULTURES: 5/18 Blood >> Pneumococcus  ANTIBIOTICS: 5/18 Zithromax >> 5/18 5/18 Rocephin >>  SIGNIFICANT EVENTS: 5/18 Admit  LINES/TUBES:  DISCUSSION: 82 yo female with pneumococcal PNA with bacteremia and acute hypoxic respiratory failure.  She also has A fib with RVR, NSTEMI from demand ischemia.  She has hx of Stage III CKD s/p renal transplant, DM, chronic diastolic CHF, HLD  ASSESSMENT / PLAN:  PULMONARY A: Acute hypoxic respiratory failure 2nd to CAP. Rt pleural effusion >> some increase in size on CXR 5/21. ATX RLL P:   US done by Dr Craige CottaSood Will consider CT chest evaluation rt base, appearing as collapse /  effusion - if declines Chest pt add Add mucomysts pcxr inam  upright  CARDIOVASCULAR A:  A fib. NSTEMI. Chronic diastolic CHF, HLD. P:  Per cardiology  RENAL A:   CKD 3 s/p renal transplant. Hyponatremia. P:   Lasix add agree kvo  GASTROINTESTINAL A:   Nutrition. P:   Carb modified diet  HEMATOLOGIC A:   Leukocytosis dvt prev NSTEMI P:  F/u CBC Hep drip  INFECTIOUS A:   Pneumococcal PNA with bacteremia. P:   Rocephin, add total 10 day s If spike-  ct chest Repeat BC  ENDOCRINE A:   DM type II.   P:   SSI  NEUROLOGIC A:   No acute issues. P:   Monitor mental status  Consider transfer out to floor  Mcarthur Rossetti. Tyson Alias, MD,  FACP [name]: [phone] [facility]

## 2015-12-23 ENCOUNTER — Inpatient Hospital Stay (HOSPITAL_COMMUNITY): Payer: Medicare Other

## 2015-12-23 DIAGNOSIS — I5030 Unspecified diastolic (congestive) heart failure: Secondary | ICD-10-CM

## 2015-12-23 DIAGNOSIS — N183 Chronic kidney disease, stage 3 unspecified: Secondary | ICD-10-CM

## 2015-12-23 DIAGNOSIS — J90 Pleural effusion, not elsewhere classified: Secondary | ICD-10-CM

## 2015-12-23 LAB — GLUCOSE, CAPILLARY
GLUCOSE-CAPILLARY: 103 mg/dL — AB (ref 65–99)
Glucose-Capillary: 123 mg/dL — ABNORMAL HIGH (ref 65–99)
Glucose-Capillary: 111 mg/dL — ABNORMAL HIGH (ref 65–99)

## 2015-12-23 LAB — COMPREHENSIVE METABOLIC PANEL
ALK PHOS: 89 U/L (ref 38–126)
ALT: 11 U/L — AB (ref 14–54)
AST: 18 U/L (ref 15–41)
Albumin: 2.8 g/dL — ABNORMAL LOW (ref 3.5–5.0)
Anion gap: 13 (ref 5–15)
BILIRUBIN TOTAL: 0.4 mg/dL (ref 0.3–1.2)
BUN: 70 mg/dL — AB (ref 6–20)
CALCIUM: 9.2 mg/dL (ref 8.9–10.3)
CHLORIDE: 98 mmol/L — AB (ref 101–111)
CO2: 20 mmol/L — ABNORMAL LOW (ref 22–32)
CREATININE: 1.29 mg/dL — AB (ref 0.44–1.00)
GFR, EST AFRICAN AMERICAN: 44 mL/min — AB (ref >60)
GFR, EST NON AFRICAN AMERICAN: 38 mL/min — AB (ref >60)
Glucose, Bld: 119 mg/dL — ABNORMAL HIGH (ref 65–99)
Potassium: 3.6 mmol/L (ref 3.5–5.1)
Sodium: 131 mmol/L — ABNORMAL LOW (ref 135–145)
TOTAL PROTEIN: 6.1 g/dL — AB (ref 6.5–8.1)

## 2015-12-23 LAB — CBC
HCT: 35 % — ABNORMAL LOW (ref 36.0–46.0)
HEMOGLOBIN: 11.4 g/dL — AB (ref 12.0–15.0)
MCH: 28.1 pg (ref 26.0–34.0)
MCHC: 32.6 g/dL (ref 30.0–36.0)
MCV: 86.4 fL (ref 78.0–100.0)
Platelets: 280 10*3/uL (ref 150–400)
RBC: 4.05 MIL/uL (ref 3.87–5.11)
RDW: 13.9 % (ref 11.5–15.5)
WBC: 13.4 10*3/uL — ABNORMAL HIGH (ref 4.0–10.5)

## 2015-12-23 LAB — TACROLIMUS LEVEL
TACROLIMUS (FK506) - LABCORP: 7.3 ng/mL (ref 2.0–20.0)
TACROLIMUS (FK506) - LABCORP: 7 ng/mL (ref 2.0–20.0)
Tacrolimus (FK506) - LabCorp: 10.6 ng/mL (ref 2.0–20.0)

## 2015-12-23 LAB — PROTIME-INR
INR: 1.36 (ref 0.00–1.49)
Prothrombin Time: 16.9 seconds — ABNORMAL HIGH (ref 11.6–15.2)

## 2015-12-23 LAB — HEPARIN LEVEL (UNFRACTIONATED)
HEPARIN UNFRACTIONATED: 0.81 [IU]/mL — AB (ref 0.30–0.70)
HEPARIN UNFRACTIONATED: 0.78 [IU]/mL — AB (ref 0.30–0.70)
Heparin Unfractionated: 0.67 IU/mL (ref 0.30–0.70)

## 2015-12-23 MED ORDER — TACROLIMUS 1 MG PO CAPS
1.5000 mg | ORAL_CAPSULE | Freq: Every morning | ORAL | Status: DC
  Administered 2015-12-24 – 2015-12-26 (×3): 1.5 mg via ORAL
  Administered 2015-12-27: 1 mg via ORAL
  Administered 2015-12-28 – 2016-01-01 (×5): 1.5 mg via ORAL
  Filled 2015-12-23 (×9): qty 1

## 2015-12-23 MED ORDER — ACETYLCYSTEINE 20 % IN SOLN
4.0000 mL | Freq: Two times a day (BID) | RESPIRATORY_TRACT | Status: AC
  Administered 2015-12-23 – 2015-12-24 (×2): 4 mL via RESPIRATORY_TRACT
  Filled 2015-12-23 (×4): qty 4

## 2015-12-23 MED ORDER — FAMOTIDINE 20 MG PO TABS
20.0000 mg | ORAL_TABLET | Freq: Two times a day (BID) | ORAL | Status: DC
  Administered 2015-12-23 – 2015-12-25 (×4): 20 mg via ORAL
  Filled 2015-12-23 (×4): qty 1

## 2015-12-23 MED ORDER — TACROLIMUS 1 MG PO CAPS
1.5000 mg | ORAL_CAPSULE | Freq: Every day | ORAL | Status: DC
  Administered 2015-12-23 – 2015-12-26 (×4): 1.5 mg via ORAL
  Filled 2015-12-23 (×5): qty 1

## 2015-12-23 NOTE — Progress Notes (Signed)
PT Cancellation Note  Patient Details Name: [name] MRN: [id number] DOB: [birth year]   Cancelled Treatment:    Reason Eval/Treat Not Completed: Other (comment)  Patient remains on bedrest. Will contact physician re: increasing activity, if appropriate.   Lenon Kuennen 12/23/2015, 8:04 AM  Pager [phone]

## 2015-12-23 NOTE — Progress Notes (Signed)
PROGRESS NOTE                                                                                                                                                                                                             Patient Demographics:    [name], is a 82 y.o. female, DOB - 20-Oct-1931, RUE:[id number]  Admit date - 12/18/2015   Admitting Physician Kathleene Hazel, MD  Outpatient Primary MD for the patient is No primary care provider on file.  LOS - 5  Outpatient Specialists: Nephrology cardiology  Chief Complaint  Patient presents with  . Shortness of Breath  . Chest Pain       Brief Narrative   82 y.o. female with a history of CAD, HTN, DM2, CKD s/p renal transplant in 2003, admitted on 5/18 after presenting with shortness of breath and chest pain, found to have A fib with RVR (CHADVASC 7), as well as NSTEMI from demand ischemia.  Plan was for cardiac cath hospital course complicated due to development of acute hypoxic respiratory failure with pneumococcal pneumonia. [facility] PCCM involving care. Nephrology consult and given worsened renal function. Patient transferred to hospitalist service for further care.   Subjective:   Patient reported dyspnea on exertion. He was tachypneic and desaturated on using bedside commode overnight and required 4 L O2 via nasal cannula. Follow-up chest x-ray showed worsened effusion.   Assessment  & Plan :   Acute respiratory failure with hypoxia secondary to pneumococcal pneumonia and pleural effusion Worsening infiltrate and effusion on chest x-ray. Receiving empiric IV Rocephin (10 day course). Continue nebulizers, Mucinex and pulmonary toilet. Given worsened chest x-ray findings. PCCM recommends CT chest and will decide on thoracentesis. -Transfer to telemetry.  Acute on Chronic kidney disease stage III History of renal transplant. Baseline creatinine 1.5. Continue  Prograf. He'll function worsened transiently and now improved. Renal following.  A. fib with RVR Required Cardizem drip on admission. Now stable on oral Cardizem and metoprolol. 2-D echo showed normal EF. now in sinus rhythm. On IV heparin with plan on starting NOAC once decided on thoracentesis.  NSTE MI Likely secondary to demand ischemia with rapid A. fib. On IV heparin. Plan on Lexiscan Myoview once acute issues resolve.    diabetes mellitus Stable. Continue sliding scale coverage.   physical deconditioning Ordered PT evaluation  Code  Status: full code Family Communication  :None at bedside Disposition Plan  : Transfer to telemetry. Pending further clinical improvement (stress test once respiratory function improved, PT evaluation)   Barriers For Discharge :   ongoing respiratory symptoms, pleural effusion, pending cardiac workup  Consults  :  Cardiology PC CM Renal   Procedures  :  2-D echo  DVT Prophylaxis  :  IV heparin  Lab Results  Component Value Date   PLT 280 12/23/2015    Antibiotics  :    Anti-infectives    Start     Dose/Rate Route Frequency Ordered Stop   12/20/15 0300  cefTRIAXone (ROCEPHIN) 2 g in dextrose 5 % 50 mL IVPB     2 g 100 mL/hr over 30 Minutes Intravenous Every 24 hours 12/19/15 0826     12/19/15 1200  amoxicillin-clavulanate (AUGMENTIN) 500-125 MG per tablet 500 mg  Status:  Discontinued     1 tablet Oral Every 12 hours 12/19/15 0821 12/19/15 0826   12/19/15 1000  azithromycin (ZITHROMAX) 500 mg in dextrose 5 % 250 mL IVPB  Status:  Discontinued     500 mg 250 mL/hr over 60 Minutes Intravenous Every 24 hours 12/18/15 0948 12/19/15 0818   12/19/15 1000  cefTRIAXone (ROCEPHIN) 1 g in dextrose 5 % 50 mL IVPB  Status:  Discontinued     1 g 100 mL/hr over 30 Minutes Intravenous Every 24 hours 12/18/15 0948 12/19/15 0150   12/19/15 0600  cefTRIAXone (ROCEPHIN) 2 g in dextrose 5 % 50 mL IVPB  Status:  Discontinued     2 g 100 mL/hr over  30 Minutes Intravenous Every 24 hours 12/19/15 0150 12/19/15 0157   12/19/15 0230  cefTRIAXone (ROCEPHIN) 2 g in dextrose 5 % 50 mL IVPB  Status:  Discontinued     2 g 100 mL/hr over 30 Minutes Intravenous Daily 12/19/15 0157 12/19/15 0818   12/18/15 0945  cefTRIAXone (ROCEPHIN) 1 g in dextrose 5 % 50 mL IVPB     1 g 100 mL/hr over 30 Minutes Intravenous  Once 12/18/15 0944 12/18/15 1114   12/18/15 0945  azithromycin (ZITHROMAX) 500 mg in dextrose 5 % 250 mL IVPB     500 mg 250 mL/hr over 60 Minutes Intravenous  Once 12/18/15 0944 12/18/15 1240        Objective:   Filed Vitals:   12/23/15 0824 12/23/15 0828 12/23/15 0900 12/23/15 1117  BP:  128/76 130/79 127/69  Pulse:  58  53  Temp:  97.5 F (36.4 C)  97.8 F (36.6 C)  TempSrc:  Oral  Oral  Resp:  14 19 15   Height:      Weight:      SpO2: 97% 95% 92% 94%    Wt Readings from Last 3 Encounters:  12/23/15 81.874 kg (180 lb 8 oz)  08/17/14 70.126 kg (154 lb 9.6 oz)  03/20/14 75.501 kg (166 lb 7.2 oz)     Intake/Output Summary (Last 24 hours) at 12/23/15 1339 Last data filed at 12/23/15 0600  Gross per 24 hour  Intake 480.03 ml  Output      0 ml  Net 480.03 ml     Physical Exam  Gen: not in distress, Appears fatigued  HEENT: no pallor, moist mucosa, supple neck chest: diminished right-sided breath sounds CVS: N S1&S2, no murmurs, rubs or gallop GI: soft, NT, ND, BS+ Musculoskeletal: warm, no edema CNS: AAOX3, non focal    Data Review:    CBC  Recent  Labs Lab 12/18/15 0830 12/19/15 0242 12/20/15 0800 12/21/15 0252 12/22/15 0243 12/23/15 0241  WBC 22.0* 16.0* 14.9* 14.6* 13.2* 13.4*  HGB 12.7 11.3* 11.8* 11.5* 11.6* 11.4*  HCT 39.4 35.2* 35.9* 36.2 35.1* 35.0*  PLT 190 216 244 280 309 280  MCV 87.8 87.8 87.1 88.7 85.8 86.4  MCH 28.3 28.2 28.6 28.2 28.4 28.1  MCHC 32.2 32.1 32.9 31.8 33.0 32.6  RDW 13.6 13.6 13.6 13.8 13.8 13.9  LYMPHSABS 1.1  --   --   --   --   --   MONOABS 1.7*  --   --   --    --   --   EOSABS 0.0  --   --   --   --   --   BASOSABS 0.0  --   --   --   --   --     Chemistries   Recent Labs Lab 12/18/15 0830 12/18/15 1330 12/19/15 0242 12/20/15 0800 12/21/15 0252 12/22/15 0243 12/23/15 0241  NA 134*  --  134* 131* 129* 128* 131*  K 3.9  --  3.8 3.7 3.9 3.9 3.6  CL 98*  --  96* 96* 95* 94* 98*  CO2 20*  --  24 22 17* 19* 20*  GLUCOSE 166*  --  132* 166* 147* 137* 119*  BUN 21*  --  28* 36* 47* 59* 70*  CREATININE 1.35*  --  1.51* 1.54* 2.05* 2.32* 1.29*  CALCIUM 10.0  --  10.0 9.9 9.0 8.9 9.2  MG  --  2.0  --   --   --   --   --   AST 22  --   --   --   --   --  18  ALT 10*  --   --   --   --   --  11*  ALKPHOS 99  --   --   --   --   --  89  BILITOT 2.0*  --   --   --   --   --  0.4   ------------------------------------------------------------------------------------------------------------------ No results for input(s): CHOL, HDL, LDLCALC, TRIG, CHOLHDL, LDLDIRECT in the last 72 hours.  Lab Results  Component Value Date   HGBA1C 6.9* 12/18/2015   ------------------------------------------------------------------------------------------------------------------ No results for input(s): TSH, T4TOTAL, T3FREE, THYROIDAB in the last 72 hours.  Invalid input(s): FREET3 ------------------------------------------------------------------------------------------------------------------ No results for input(s): VITAMINB12, FOLATE, FERRITIN, TIBC, IRON, RETICCTPCT in the last 72 hours.  Coagulation profile No results for input(s): INR, PROTIME in the last 168 hours.  No results for input(s): DDIMER in the last 72 hours.  Cardiac Enzymes  Recent Labs Lab 12/18/15 1330 12/18/15 1822 12/19/15 0124  TROPONINI 1.04* 1.26* 0.93*   ------------------------------------------------------------------------------------------------------------------    Component Value Date/Time   BNP 1232.3* 12/18/2015 0830    Inpatient Medications  Scheduled  Meds: . acetylcysteine  4 mL Nebulization BID  . albuterol  2.5 mg Nebulization BID  . allopurinol  100 mg Oral QHS  . antiseptic oral rinse  7 mL Mouth Rinse BID  . aspirin EC  81 mg Oral Daily  . atorvastatin  10 mg Oral q1800  . cefTRIAXone (ROCEPHIN)  IV  2 g Intravenous Q24H  . cinacalcet  30 mg Oral Q supper  . diltiazem  240 mg Oral Daily  . famotidine  20 mg Oral BID  . guaiFENesin  1,200 mg Oral BID  . insulin aspart  0-15 Units Subcutaneous TID WC  . metoprolol  100 mg Oral BID  . mupirocin ointment  1 application Nasal BID  . mycophenolate  500 mg Oral BID  . sodium chloride flush  3 mL Intravenous Q12H  . tacrolimus  1.5 mg Oral QHS  . [START ON 12/24/2015] tacrolimus  1.5 mg Oral q morning - 10a   Continuous Infusions: . sodium chloride 10 mL/hr at 12/23/15 0132  . diltiazem (CARDIZEM) infusion Stopped (12/21/15 0800)  . heparin 1,400 Units/hr (12/23/15 0341)   PRN Meds:.sodium chloride, acetaminophen, albuterol, nitroGLYCERIN, ondansetron (ZOFRAN) IV, senna-docusate, sodium chloride flush, traMADol  Micro Results Recent Results (from the past 240 hour(s))  Culture, blood (Routine X 2) w Reflex to ID Panel     Status: Abnormal   Collection Time: 12/18/15 10:49 AM  Result Value Ref Range Status   Specimen Description BLOOD RIGHT HAND  Final   Special Requests BOTTLES DRAWN AEROBIC AND ANAEROBIC  5CC  Final   Culture  Setup Time   Final    GRAM POSITIVE COCCI IN CHAINS IN PAIRS IN BOTH AEROBIC AND ANAEROBIC BOTTLES Organism ID to follow CRITICAL RESULT CALLED TO, READ BACK BY AND VERIFIED WITH: CARON AMEND,PHARMD@0050  12/19/15 MKELLY    Culture STREPTOCOCCUS PNEUMONIAE (A)  Final   Report Status 12/21/2015 FINAL  Final   Organism ID, Bacteria STREPTOCOCCUS PNEUMONIAE  Final      Susceptibility   Streptococcus pneumoniae - MIC*    ERYTHROMYCIN 2 RESISTANT Resistant     LEVOFLOXACIN 0.5 SENSITIVE Sensitive     VANCOMYCIN 0.5 SENSITIVE Sensitive     PENICILLIN  0.25 SENSITIVE Sensitive     CEFTRIAXONE 0.25 SENSITIVE Sensitive     * STREPTOCOCCUS PNEUMONIAE  Blood Culture ID Panel (Reflexed)     Status: Abnormal   Collection Time: 12/18/15 10:49 AM  Result Value Ref Range Status   Enterococcus species NOT DETECTED NOT DETECTED Final   Vancomycin resistance NOT DETECTED NOT DETECTED Final   Listeria monocytogenes NOT DETECTED NOT DETECTED Final   Staphylococcus species NOT DETECTED NOT DETECTED Final   Staphylococcus aureus NOT DETECTED NOT DETECTED Final   Methicillin resistance NOT DETECTED NOT DETECTED Final   Streptococcus species NOT DETECTED NOT DETECTED Final   Streptococcus agalactiae NOT DETECTED NOT DETECTED Final   Streptococcus pneumoniae DETECTED (A) NOT DETECTED Final    Comment: CRITICAL RESULT CALLED TO, READ BACK BY AND VERIFIED WITH: CARON AMEND,PHARMD @0050  12/19/15 MKELLY    Streptococcus pyogenes NOT DETECTED NOT DETECTED Final   Acinetobacter baumannii NOT DETECTED NOT DETECTED Final   Enterobacteriaceae species NOT DETECTED NOT DETECTED Final   Enterobacter cloacae complex NOT DETECTED NOT DETECTED Final   Escherichia coli NOT DETECTED NOT DETECTED Final   Klebsiella oxytoca NOT DETECTED NOT DETECTED Final   Klebsiella pneumoniae NOT DETECTED NOT DETECTED Final   Proteus species NOT DETECTED NOT DETECTED Final   Serratia marcescens NOT DETECTED NOT DETECTED Final   Carbapenem resistance NOT DETECTED NOT DETECTED Final   Haemophilus influenzae NOT DETECTED NOT DETECTED Final   Neisseria meningitidis NOT DETECTED NOT DETECTED Final   Pseudomonas aeruginosa NOT DETECTED NOT DETECTED Final   Candida albicans NOT DETECTED NOT DETECTED Final   Candida glabrata NOT DETECTED NOT DETECTED Final   Candida krusei NOT DETECTED NOT DETECTED Final   Candida parapsilosis NOT DETECTED NOT DETECTED Final   Candida tropicalis NOT DETECTED NOT DETECTED Final  Culture, blood (Routine X 2) w Reflex to ID Panel     Status: Abnormal    Collection Time: 12/18/15 10:59 AM  Result Value Ref Range Status   Specimen Description BLOOD LEFT ANTECUBITAL  Final   Special Requests BOTTLES DRAWN AEROBIC AND ANAEROBIC  5CC  Final   Culture  Setup Time   Final    GRAM POSITIVE COCCI IN PAIRS IN CHAINS IN BOTH AEROBIC AND ANAEROBIC BOTTLES CRITICAL RESULT CALLED TO, READ BACK BY AND VERIFIED WITH: CARON AMEND,PHARMD @0050  12/19/15 KELLYM    Culture (A)  Final    STREPTOCOCCUS PNEUMONIAE SUSCEPTIBILITIES PERFORMED ON PREVIOUS CULTURE WITHIN THE LAST 5 DAYS.    Report Status 12/21/2015 FINAL  Final  MRSA PCR Screening     Status: Abnormal   Collection Time: 12/18/15  9:20 PM  Result Value Ref Range Status   MRSA by PCR POSITIVE (A) NEGATIVE Final    Comment:        The GeneXpert MRSA Assay (FDA approved for NASAL specimens only), is one component of a comprehensive MRSA colonization surveillance program. It is not intended to diagnose MRSA infection nor to guide or monitor treatment for MRSA infections. RESULT CALLED TO, READ BACK BY AND VERIFIED WITH: S ROUSE,RN @0302  12/19/15 MKELLY   Culture, blood (Routine X 2) w Reflex to ID Panel     Status: None (Preliminary result)   Collection Time: 12/22/15 10:58 AM  Result Value Ref Range Status   Specimen Description BLOOD RIGHT HAND  Final   Special Requests IN PEDIATRIC BOTTLE  3CC  Final   Culture NO GROWTH 1 DAY  Final   Report Status PENDING  Incomplete  Culture, blood (Routine X 2) w Reflex to ID Panel     Status: None (Preliminary result)   Collection Time: 12/22/15 11:00 AM  Result Value Ref Range Status   Specimen Description BLOOD RIGHT HAND  Final   Special Requests IN PEDIATRIC BOTTLE 3.0CC  Final   Culture NO GROWTH 1 DAY  Final   Report Status PENDING  Incomplete    Radiology Reports Dg Chest Port 1 View  12/23/2015  CLINICAL DATA:  Pneumonia. EXAM: PORTABLE CHEST 1 VIEW COMPARISON:  12/22/2015. FINDINGS: Stable cardiomegaly. Progressive infiltrate right  lower lobe. Progressive right pleural effusion. Mild left base subsegmental atelectasis and or infiltrate. Left hemidiaphragm not imaged. No pneumothorax. IMPRESSION: 1. Progressive right lower lobe infiltrate and right pleural effusion. 2.  Left base subsegmental atelectasis and or mild infiltrate. 3.  Cardiomegaly.  No pulmonary venous congestion . Electronically Signed   By: Maisie Fushomas  Register   On: 12/23/2015 07:18   Dg Chest Port 1 View  12/22/2015  CLINICAL DATA:  Continued assessment of pleural effusion. EXAM: PORTABLE CHEST 1 VIEW COMPARISON:  12/21/2015. FINDINGS: Cardiomegaly. Large RIGHT pleural effusion is increased. Compressive atelectasis at the RIGHT base. Trace LEFT effusion may be improved. Overall improved vascular congestion. IMPRESSION: Increasing RIGHT pleural effusion otherwise improved exam. Electronically Signed   By: Elsie StainJohn T Curnes [name]   On: 12/22/2015 07:27   Dg Chest Port 1 View  12/21/2015  CLINICAL DATA:  Community-acquired pneumonia EXAM: PORTABLE CHEST 1 VIEW COMPARISON:  Chest radiograph from one day prior. FINDINGS: Stable cardiomediastinal silhouette with mild cardiomegaly. No pneumothorax. Moderate right and small left pleural effusions, increased bilaterally. Mild pulmonary edema, increased. Patchy bibasilar lung consolidation, right greater than left, slightly worsened bilaterally. IMPRESSION: 1. Patchy bibasilar lung consolidation, right greater than left, slightly worsened bilaterally, likely a combination of atelectasis and pneumonia. 2. Stable mild cardiomegaly with increased mild pulmonary edema. 3. Moderate right and small left pleural effusions, increased bilaterally. Electronically Signed  By: Delbert Phenix [name]   On: 12/21/2015 08:39   Dg Chest Port 1 View  12/20/2015  CLINICAL DATA:  Shortness of breath EXAM: PORTABLE CHEST 1 VIEW COMPARISON:  12/18/2015 FINDINGS: New right lower lung opacity, likely combination of atelectasis/ pneumonia and layering pleural  effusion. Mild left basilar opacity, possibly atelectasis. Cardiomegaly.  No frank interstitial edema. No pneumothorax. IMPRESSION: New right lower lung opacity, likely combination of atelectasis/ pneumonia and layering pleural effusion. Mild left basilar opacity, possibly atelectasis. Electronically Signed   By: Charline Bills [name]   On: 12/20/2015 11:53   Dg Chest Port 1 View  12/18/2015  CLINICAL DATA:  Productive cough for 2 weeks, shortness of breath, headache EXAM: PORTABLE CHEST 1 VIEW COMPARISON:  08/11/2014 FINDINGS: Borderline cardiomegaly. No pulmonary edema. There is streaky left base retrocardiac atelectasis or early infiltrate. Atherosclerotic but for sclerotic calcifications of thoracic aorta. IMPRESSION: Streaky left base retrocardiac atelectasis or early infiltrate. No pulmonary edema. Follow-up to resolution recommended. Electronically Signed   By: Natasha Mead [name]   On: 12/18/2015 09:38    Time Spent in minutes  35   Eddie North M.D on 12/23/2015 at 1:39 PM  Between 7am to 7pm - Pager - [phone]  After 7pm go to [email or web address] - password [facility]  [facility] -  Office  [phone]

## 2015-12-23 NOTE — Progress Notes (Signed)
ANTICOAGULATION CONSULT NOTE - Follow Up Consult  Pharmacy Consult for Heparin Indication: elevated troponins, Afib  Allergies  Allergen Reactions  . Sulfa Drugs Cross Reactors Swelling    Patient Measurements: Height: 5\' 3"  (160 cm) Weight: 180 lb 8 oz (81.874 kg) IBW/kg (Calculated) : 52.4 Heparin Dosing Weight: 68 kg  Vital Signs: Temp: 97.8 F (36.6 C) (05/23 1117) Temp Source: Oral (05/23 1117) BP: 127/69 mmHg (05/23 1117) Pulse Rate: 53 (05/23 1117)  Labs:  Recent Labs  12/21/15 0252 12/22/15 0243 12/23/15 0241 12/23/15 1137  HGB 11.5* 11.6* 11.4*  --   HCT 36.2 35.1* 35.0*  --   PLT 280 309 280  --   HEPARINUNFRC 0.65 0.59 0.81* 0.78*  CREATININE 2.05* 2.32* 1.29*  --     Estimated Creatinine Clearance: 34.7 mL/min (by C-G formula based on Cr of 1.29).  Assessment: 82 y.o. F initially started on heparin for r/o ACS, then continued for new onset Afib with CHA2DS2VASc = 7. Heparin level remains supratherapeutic on 1400 units/hr - pt with worsening SCr so likely not clearing as well. CBC stable.  Goal of Therapy:  Heparin level 0.3-0.7 units/ml Monitor platelets by anticoagulation protocol: Yes   Plan:  Decrease heparin gtt to 1300 units/hr F/u 8 hr heparin level  Greggory Stallionristy Reyes, PharmD Clinical Pharmacy Resident Pager # [phone] 12/23/2015 1:12 PM

## 2015-12-23 NOTE — Progress Notes (Signed)
PT Cancellation Note  Patient Details Name: [name] MRN: [id number] DOB: [birth year]   Cancelled Treatment:    Reason Eval/Treat Not Completed: Patient at procedure or test/unavailable  Fourth attempt to see pt today (on bedrest, with CCM, with CCM, down for CT).   Aleigha Gilani 12/23/2015, 2:15 PM  Pager [phone]

## 2015-12-23 NOTE — Progress Notes (Signed)
PULMONARY / CRITICAL CARE MEDICINE   Name: [name] MRN: [id number] DOB: 31-Jul-1932    ADMISSION DATE:  12/18/2015 CONSULTATION DATE:  12/20/2015  REFERRING MD:  Dr. Anne Fu  CHIEF COMPLAINT:  Short of breath  SUBJECTIVE:  Desaturated with activity on 4L to 80's per RN.  Pt reports she is very anxious about thora (conflicting orders).  Tolerating chest PT.  Stress incontinence making it difficult for I/O measurement.  VITAL SIGNS: BP 130/79 mmHg  Pulse 58  Temp(Src) 97.5 F (36.4 C) (Oral)  Resp 19  Ht  (1.6 m)  Wt 180 lb 8 oz (81.874 kg)  BMI 31.98 kg/m2  SpO2 92%  INTAKE / OUTPUT: I/O last 3 completed shifts: In: 1374.5 [P.O.:240; I.V.:934.5; IV Piggyback:200] Out: 175 [Urine:175]  PHYSICAL EXAMINATION: General:  Elderly female in NAD, alert Neuro:  Normal strength, AAOx4 HEENT:  no stridor, jvd Cardiovascular: s1 s2 RR irregular, PVC's on monitor Lungs:  Diminished on R lower, left clear, moist cough Abdomen:  Soft, non tender Musculoskeletal:  No edema Skin:  No rashes  LABS:  BMET  Recent Labs Lab 12/21/15 0252 12/22/15 0243 12/23/15 0241  NA 129* 128* 131*  K 3.9 3.9 3.6  CL 95* 94* 98*  CO2 17* 19* 20*  BUN 47* 59* 70*  CREATININE 2.05* 2.32* 1.29*  GLUCOSE 147* 137* 119*    Electrolytes  Recent Labs Lab 12/18/15 1330  12/21/15 0252 12/22/15 0243 12/23/15 0241  CALCIUM  --   < > 9.0 8.9 9.2  MG 2.0  --   --   --   --   < > = values in this interval not displayed.  CBC  Recent Labs Lab 12/21/15 0252 12/22/15 0243 12/23/15 0241  WBC 14.6* 13.2* 13.4*  HGB 11.5* 11.6* 11.4*  HCT 36.2 35.1* 35.0*  PLT 280 309 280    Coag's No results for input(s): APTT, INR in the last 168 hours.  Sepsis Markers  Recent Labs Lab 12/18/15 1055  LATICACIDVEN 1.48    ABG  Recent Labs Lab 12/20/15 1109  PHART 7.474*  PCO2ART 25.3*  PO2ART 69.1*    Liver Enzymes  Recent Labs Lab 12/18/15 0830 12/23/15 0241  AST 22 18   ALT 10* 11*  ALKPHOS 99 89  BILITOT 2.0* 0.4  ALBUMIN 2.8* 2.8*    Cardiac Enzymes  Recent Labs Lab 12/18/15 1330 12/18/15 1822 12/19/15 0124  TROPONINI 1.04* 1.26* 0.93*    Glucose  Recent Labs Lab 12/21/15 2108 12/22/15 0832 12/22/15 1209 12/22/15 1604 12/22/15 2129 12/23/15 0820  GLUCAP 149* 115* 109* 134* 126* 103*    Imaging Dg Chest Port 1 View  12/23/2015  CLINICAL DATA:  Pneumonia. EXAM: PORTABLE CHEST 1 VIEW COMPARISON:  12/22/2015. FINDINGS: Stable cardiomegaly. Progressive infiltrate right lower lobe. Progressive right pleural effusion. Mild left base subsegmental atelectasis and or infiltrate. Left hemidiaphragm not imaged. No pneumothorax. IMPRESSION: 1. Progressive right lower lobe infiltrate and right pleural effusion. 2.  Left base subsegmental atelectasis and or mild infiltrate. 3.  Cardiomegaly.  No pulmonary venous congestion . Electronically Signed   By: Maisie Fus  Register   On: 12/23/2015 07:18     STUDIES:  5/19 Echo >> EF 55 to 60%, severe LVH  CULTURES: 5/18 Blood >> Pneumococcus 5/22 Blood >>   ANTIBIOTICS: 5/18 Zithromax >> 5/18 5/18 Rocephin >>  SIGNIFICANT EVENTS: 5/18  Admit 5/21  Bedside US >> not enough fluid to tap  LINES/TUBES:  DISCUSSION: 71 [name] with pneumococcal PNA with  bacteremia and acute hypoxic respiratory failure.  She also has A fib with RVR, NSTEMI from demand ischemia.  She has hx of Stage III CKD s/p renal transplant, DM, chronic diastolic CHF, HLD  ASSESSMENT / PLAN:  PULMONARY A: Acute hypoxic respiratory failure 2nd to CAP. Rt pleural effusion >> some increase in size on CXR 5/21. ATX RLL P:   US done by Dr Craige CottaSood 5/21 >> small effusion on R, minimal on L  Consider CT chest evaluation rt base if patient declines, appearing as collapse / effusion   Continue pulmonary hygiene - chest PT / percussion, mucomyst, mobilize as tolerated, IS Trend PCXR Will discuss further Thora with Attending MD    CARDIOVASCULAR A:  A fib. NSTEMI. Chronic diastolic CHF, HLD. P:  Per cardiology Rate control   RENAL A:   CKD 3 s/p renal transplant. Hyponatremia. P:   Consider diuresis as renal function / BP permit Trend BMP / UOP  Replace electrolytes as indicated   GASTROINTESTINAL A:   Nutrition. P:   Carb modified diet as tolerated  HEMATOLOGIC A:   Leukocytosis DVT prophylaxis  NSTEMI P:  F/u CBC Hep drip gtt per pharmacy   INFECTIOUS A:   Pneumococcal PNA with bacteremia. P:   Rocephin, complete 10 days total If fevers, would CT chest  Repeat BC  ENDOCRINE A:   DM type II.   P:   SSI  NEUROLOGIC A:   No acute issues. P:   Monitor mental status   Consider transfer out to floor.  Defer to primary.     Canary BrimBrandi Ollis, NP-C [facility] [name]: [phone] or if no answer [phone] 12/23/2015, 10:16 AM   STAFF NOTE: I, Rory Percyaniel Ell Tiso, MD FACP have personally reviewed patient's available data, including medical history, events of note, physical examination and test results as part of my evaluation. I have discussed with resident/NP and other care providers such as pharmacist, RN and RRT. In addition, I personally evaluated patient and elicited key findings of: no distress, no fever spikes, some desat with activity, pcxr I ordered to follow effusion/ atx noted now with some loculation?, would DC IR thora for now and assess CT chest noncontrast, then would revaluate need thora which PCCM likley could do with US guidance at bedside, pcxr in am, maintain current abx x 10 days course , renal fxn improved likely, per renal, flutter valve, pulm toilet  Mcarthur Rossettianiel J. Tyson AliasFeinstein, MD, FACP [name]: [phone] [facility] 12/23/2015 10:36 AM

## 2015-12-23 NOTE — Progress Notes (Signed)
Patient ID: [name], female   DOB: September 06, 1931, 82 y.o.   MRN: [id number]  [facility] Progress Note   Assessment/ Plan:   1. AKI on CKD3T: Status post renal transplant in 2003 maintained on Tac/MMF with baseline creatinine between 1.0-1.4. Current acute on chronic renal failure suspected to be hemodynamically mediated from A. fib/RVR versus normotensive ATN with associated community acquired pneumonia. Elevated Prograf levels noted-likely to be contributing to her acute renal insufficiency (this is due to the CYP interference with diltiazem). I will decrease her Prograf to 1.5 mg twice a day and continue to follow levels-the goal this far out from transplant should be 4-6. Urine output charted appears to be inaccurate-creatinine appears better today (unclear whether this is an erroneous lab as a similar patient down the hall has a starkly lower creatinine with a previously much higher known baseline). 2. A. fib with RVR: She appears to have converted back to normal sinus rhythm and now from intravenous to oral diltiazem. 3. NSTEMI with history of coronary artery disease: This is suspected to have been from demand ischemia but with history of CAD-needs further inquiry. Cardiology favoring Lexiscan over cardiac catheterization to avoid intravenous iodinated contrast exposure. Will ultimately require NOAC.  4. Community acquired pneumonia/right pleural effusion: Optimizing diuretic therapy and on intravenous ceftriaxone. To undergo thoracentesis today. 5. 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  Subjective:   She endorses some shortness of breath and some discomfort on the right side of her chest-awaiting thoracentesis later today.    Objective:   BP 130/79 mmHg  Pulse 58  Temp(Src) 97.5 F (36.4 C) (Oral)  Resp 19  Ht 5\' 3"  (1.6 m)  Wt 81.874 kg (180 lb 8 oz)  BMI 31.98 kg/m2  SpO2 92%  Intake/Output Summary (Last 24 hours) at 12/23/15  0933 Last data filed at 12/23/15 0600  Gross per 24 hour  Intake 802.03 ml  Output      0 ml  Net 802.03 ml   Weight change: 7.774 kg (17 lb 2.2 oz)  Physical Exam: WUJ:[id number]:Appears uncomfortable resting in bed CVS: Pulse regular bradycardia, S1 and S2 normal Resp: Decreased breath sounds right base with fine rales left base Abd: Soft, flat, nontender Ext: Trace to 1+ lower extremity edema  Imaging: Dg Chest Port 1 View  12/23/2015  CLINICAL DATA:  Pneumonia. EXAM: PORTABLE CHEST 1 VIEW COMPARISON:  12/22/2015. FINDINGS: Stable cardiomegaly. Progressive infiltrate right lower lobe. Progressive right pleural effusion. Mild left base subsegmental atelectasis and or infiltrate. Left hemidiaphragm not imaged. No pneumothorax. IMPRESSION: 1. Progressive right lower lobe infiltrate and right pleural effusion. 2.  Left base subsegmental atelectasis and or mild infiltrate. 3.  Cardiomegaly.  No pulmonary venous congestion . Electronically Signed   By: [name]  [name]   On: 12/23/2015 07:18   Dg Chest Port 1 View  12/22/2015  CLINICAL DATA:  Continued assessment of pleural effusion. EXAM: PORTABLE CHEST 1 VIEW COMPARISON:  12/21/2015. FINDINGS: Cardiomegaly. Large RIGHT pleural effusion is increased. Compressive atelectasis at the RIGHT base. Trace LEFT effusion may be improved. Overall improved vascular congestion. IMPRESSION: Increasing RIGHT pleural effusion otherwise improved exam. Electronically Signed   By: Joy StainJohn T [name] [name]   On: 12/22/2015 07:27    Labs: BMET  Recent Labs Lab 12/18/15 0830 12/19/15 0242 12/20/15 0800 12/21/15 0252 12/22/15 0243 12/23/15 0241  NA 134* 134* 131* 129* 128* 131*  K 3.9 3.8 3.7 3.9 3.9 3.6  CL 98* 96* 96* 95* 94* 98*  CO2 20* 24 22 17* 19* 20*  GLUCOSE 166* 132* 166* 147* 137* 119*  BUN 21* 28* 36* 47* 59* 70*  CREATININE 1.35* 1.51* 1.54* 2.05* 2.32* 1.29*  CALCIUM 10.0 10.0 9.9 9.0 8.9 9.2   CBC  Recent Labs Lab 12/18/15 0830  12/20/15 0800  12/21/15 0252 12/22/15 0243 12/23/15 0241  WBC 22.0*  < > 14.9* 14.6* 13.2* 13.4*  NEUTROABS 19.2*  --   --   --   --   --   HGB 12.7  < > 11.8* 11.5* 11.6* 11.4*  HCT 39.4  < > 35.9* 36.2 35.1* 35.0*  MCV 87.8  < > 87.1 88.7 85.8 86.4  PLT 190  < > 244 280 309 280  < > = values in this interval not displayed.  Medications:    . acetylcysteine  4 mL Nebulization BID  . albuterol  2.5 mg Nebulization BID  . allopurinol  100 mg Oral QHS  . antiseptic oral rinse  7 mL Mouth Rinse BID  . aspirin EC  81 mg Oral Daily  . atorvastatin  10 mg Oral q1800  . cefTRIAXone (ROCEPHIN)  IV  2 g Intravenous Q24H  . cinacalcet  30 mg Oral Q supper  . diltiazem  240 mg Oral Daily  . famotidine  20 mg Oral Daily  . guaiFENesin  1,200 mg Oral BID  . insulin aspart  0-15 Units Subcutaneous TID WC  . metoprolol  100 mg Oral BID  . mupirocin ointment  1 application Nasal BID  . mycophenolate  500 mg Oral BID  . sodium chloride flush  3 mL Intravenous Q12H  . tacrolimus  2 mg Oral QHS  . tacrolimus  2 mg Oral q morning - 10a   Joy Bills, MD 12/23/2015, 9:33 AM

## 2015-12-23 NOTE — Procedures (Signed)
US chest  1. Moderate effusion rt  , lung flap 3,5 cm away, pocket noted large  Plan, in am if not improved aeration pcxr will thora at bedside Hep off 6 am  Mcarthur Rossettianiel J. Tyson AliasFeinstein, MD, FACP [name]: [phone] [facility]

## 2015-12-23 NOTE — Progress Notes (Signed)
Patient Profile: 82 y/o female with h/o CAD s/p remote RCA PCI, renal disease s/p renal transplant, admitted 5/17 for CP and SOB, found to be in Afib w/ RVR and with Pneumoccal PNA.   Subjective: Felt nauseated last night but improved. Breathing has improved.   Objective: Vital signs in last 24 hours: Temp:  [97.3 F (36.3 C)-98.2 F (36.8 C)] 97.4 F (36.3 C) (05/23 0409) Pulse Rate:  [55-74] 64 (05/22 2134) Resp:  [16-31] 16 (05/23 0600) BP: (75-138)/(45-113) 125/70 mmHg (05/23 0600) SpO2:  [91 %-99 %] 98 % (05/23 0600) Weight:  [180 lb 8 oz (81.874 kg)] 180 lb 8 oz (81.874 kg) (05/23 0355) Last BM Date: 12/22/15  Intake/Output from previous day: 05/22 0701 - 05/23 0700 In: 973 [P.O.:240; I.V.:583; IV Piggyback:150] Out: 175 [Urine:175] Intake/Output this shift:    Medications Current Facility-Administered Medications  Medication Dose Route Frequency Provider Last Rate Last Dose  . 0.9 %  sodium chloride infusion   Intravenous Continuous Lorre Nick, MD 10 mL/hr at 12/23/15 0132    . 0.9 %  sodium chloride infusion  250 mL Intravenous PRN Ok Anis, NP   Stopped at 12/21/15 2000  . acetaminophen (TYLENOL) tablet 650 mg  650 mg Oral Q4H PRN Ok Anis, NP   650 mg at 12/20/15 1809  . acetylcysteine (MUCOMYST) 20 % nebulizer / oral solution 4 mL  4 mL Nebulization BID Kathleene Hazel, MD   4 mL at 12/22/15 1926  . albuterol (PROVENTIL) (2.5 MG/3ML) 0.083% nebulizer solution 2.5 mg  2.5 mg Nebulization BID Kathleene Hazel, MD   2.5 mg at 12/22/15 1926  . albuterol (PROVENTIL) (2.5 MG/3ML) 0.083% nebulizer solution 2.5 mg  2.5 mg Nebulization Q6H PRN Kathleene Hazel, MD   2.5 mg at 12/22/15 1143  . allopurinol (ZYLOPRIM) tablet 100 mg  100 mg Oral QHS Ok Anis, NP   100 mg at 12/22/15 2135  . antiseptic oral rinse (CPC / CETYLPYRIDINIUM CHLORIDE 0.05%) solution 7 mL  7 mL Mouth Rinse BID Kathleene Hazel, MD   7 mL at  12/22/15 2139  . aspirin EC tablet 81 mg  81 mg Oral Daily Ok Anis, NP   81 mg at 12/22/15 1033  . atorvastatin (LIPITOR) tablet 10 mg  10 mg Oral q1800 Almon Hercules, RPH   10 mg at 12/22/15 1737  . cefTRIAXone (ROCEPHIN) 2 g in dextrose 5 % 50 mL IVPB  2 g Intravenous Q24H Kathleene Hazel, MD   2 g at 12/23/15 0255  . cinacalcet (SENSIPAR) tablet 30 mg  30 mg Oral Q supper Kathleene Hazel, MD   30 mg at 12/21/15 2152  . diltiazem (CARDIZEM CD) 24 hr capsule 240 mg  240 mg Oral Daily Kathleene Hazel, MD   240 mg at 12/22/15 1033  . diltiazem (CARDIZEM) 100 mg in dextrose 5 % 100 mL (1 mg/mL) infusion  5-15 mg/hr Intravenous Continuous Kathleene Hazel, MD   Stopped at 12/21/15 0800  . famotidine (PEPCID) tablet 20 mg  20 mg Oral Daily Kathleene Hazel, MD   20 mg at 12/22/15 1033  . guaiFENesin (MUCINEX) 12 hr tablet 1,200 mg  1,200 mg Oral BID Coralyn Helling, MD   1,200 mg at 12/22/15 2135  . heparin ADULT infusion 100 units/mL (25000 units/250 mL)  1,400 Units/hr Intravenous Continuous Titus Mould, RPH 14 mL/hr at 12/23/15 0341 1,400 Units/hr at 12/23/15 0341  . insulin aspart (  novoLOG) injection 0-15 Units  0-15 Units Subcutaneous TID WC Ok Anis, NP   2 Units at 12/22/15 1737  . metoprolol (LOPRESSOR) tablet 100 mg  100 mg Oral BID Ok Anis, NP   100 mg at 12/22/15 2136  . mupirocin ointment (BACTROBAN) 2 % 1 application  1 application Nasal BID Kathleene Hazel, MD   1 application at 12/22/15 2139  . mycophenolate (CELLCEPT) capsule 500 mg  500 mg Oral BID Ok Anis, NP   500 mg at 12/22/15 2134  . nitroGLYCERIN (NITROSTAT) SL tablet 0.4 mg  0.4 mg Sublingual Q5 Min x 3 PRN Ok Anis, NP      . ondansetron [facility]) injection 4 mg  4 mg Intravenous Q6H PRN Ok Anis, NP      . senna-docusate (Senokot-S) tablet 1 tablet  1 tablet Oral Daily PRN Ok Anis, NP      . sodium chloride  flush (NS) 0.9 % injection 3 mL  3 mL Intravenous Q12H Ok Anis, NP   3 mL at 12/22/15 2139  . sodium chloride flush (NS) 0.9 % injection 3 mL  3 mL Intravenous PRN Ok Anis, NP      . tacrolimus (PROGRAF) capsule 2 mg  2 mg Oral QHS [name], MD   2 mg at 12/22/15 2137  . tacrolimus (PROGRAF) capsule 2 mg  2 mg Oral q morning - 10a [name], MD   2 mg at 12/22/15 1033  . traMADol (ULTRAM) tablet 50 mg  50 mg Oral Q6H PRN Wilford Grist, MD   50 mg at 12/20/15 0206    PE: General appearance: alert, cooperative and no distress Neck: elevated JVD Lungs: decreased BS bilaterally R>L Heart: regular rate and rhythm, S1, S2 normal, no murmur, click, rub or gallop Extremities: no LEE Pulses: 2+ and symmetric Skin: warm and dry Neurologic: Grossly normal  Lab Results:   Recent Labs  12/21/15 0252 12/22/15 0243 12/23/15 0241  WBC 14.6* 13.2* 13.4*  HGB 11.5* 11.6* 11.4*  HCT 36.2 35.1* 35.0*  PLT 280 309 280   BMET  Recent Labs  12/21/15 0252 12/22/15 0243 12/23/15 0241  NA 129* 128* 131*  K 3.9 3.9 3.6  CL 95* 94* 98*  CO2 17* 19* 20*  GLUCOSE 147* 137* 119*  BUN 47* 59* 70*  CREATININE 2.05* 2.32* 1.29*  CALCIUM 9.0 8.9 9.2   Cardiac Panel (last 3 results) No results for input(s): CKTOTAL, CKMB, TROPONINI, RELINDX in the last 72 hours.  Studies/Results: @RISRSLT2 @   Assessment/Plan  Active Problems:   S/P kidney transplant   Diastolic CHF (HCC)   CAD S/P remote RCA PCI   A-fib [facility])   Community acquired pneumonia   Pleural effusion   Bacteremia   CAP (community acquired pneumonia)   1. Atrial Fibrillation w/ RVR: converted to NSR. EF nl at 55-60%. Rate is controlled on metoprolol. Sinus brady with rate in the 50s. Continue IV heparin. Will plan to add NOAC after decision is made regarding thoracentesis.    2. Elevated Troponin: troponin + x 3, peaking at 1.26. Suspect demand ischemia from rapid afib and PNA. Given renal  function and h/o renal transplant, would favor risk stratification with a NST over cath. Will plan to do once she recovers from her acute pulmonary illness. Likely later this week.   3. PNA: per IM. On antibiotics.   4. Right Pleural Effusion: per PCCM, ? Thoracentesis. ? Chest  CT. Will defer to PCCM. Will restart lasix.   5. CKD: s/p renal transplant. SCr improved significantly since yesterday, down from 2.32>>1.29. Restart PO Lasix. Hold ARB.   6. Acute on Chronic Diastolic CHF: BNP was abnormal at 1200 on admit. I/O + 7L. Renal function has improved. She was on 80 mg PO lasix as an outpatient. Will restart 40 mg PO today and will follow daily BMPs closely. BP is stable, continue to hold ARB. Strict I/Os.    LOS: 5 days    Brittainy M. Delmer IslamSimmons, PA-C 12/23/2015 7:25 AM  Agree with note written by Boyce MediciBrittany Simmons  PAC  Feeling better this AM. NSR/SB. Renal FXN improved. On OV hep (plans to start NOAC once decision re thoracentesis made by PCCM). Exam otherwise benign except for decreased BS right base. Can prob go to tele if TRH agrees. Appreciate their assuming primary care . Will arrange Lexiscan later in the week to risk stratify given mildly elev trop prob representing demand ischemia.  Nanetta BattyBerry, Jenavee Laguardia 12/23/2015 8:42 AM

## 2015-12-23 NOTE — Progress Notes (Signed)
ANTICOAGULATION CONSULT NOTE - Follow Up Consult  Pharmacy Consult for Heparin Indication: elevated troponins, Afib  Allergies  Allergen Reactions  . Sulfa Drugs Cross Reactors Swelling    Patient Measurements: Height: 5\' 3"  (160 cm) Weight: 163 lb 5.8 oz (74.1 kg) IBW/kg (Calculated) : 52.4 Heparin Dosing Weight: 68 kg  Vital Signs: Temp: 97.3 F (36.3 C) (05/22 2354) Temp Source: Oral (05/22 2354) BP: 117/62 mmHg (05/23 0200) Pulse Rate: 64 (05/22 2134)  Labs:  Recent Labs  12/20/15 0800  12/21/15 0252 12/22/15 0243 12/23/15 0241  HGB 11.8*  --  11.5* 11.6* 11.4*  HCT 35.9*  --  36.2 35.1* 35.0*  PLT 244  --  280 309 280  HEPARINUNFRC 0.28*  < > 0.65 0.59 0.81*  CREATININE 1.54*  --  2.05* 2.32*  --   < > = values in this interval not displayed.  Estimated Creatinine Clearance: 18.3 mL/min (by C-G formula based on Cr of 2.32).  Assessment: 82 y.o. F initially started on heparin for r/o ACS, then continued for new onset Afib with CHA2DS2VASc = 7. Heparin level now supratherapeutic on 1550 units/hr - pt with worsening SCr so likely not clearing as well. Heparin line had to be changed earlier this shift but no real interruption in heparin, RN noted that IV line did take awhile to stop bleeding after site changed. CBC stable.  Goal of Therapy:  Heparin level 0.3-0.7 units/ml Monitor platelets by anticoagulation protocol: Yes   Plan:  Decrease heparin gtt to 1400 units/hr F/u 8 hr heparin level  Christoper Fabianaron Bruna Dills, PharmD, BCPS Clinical pharmacist, pager [phone] 12/23/2015 3:33 AM

## 2015-12-23 NOTE — Progress Notes (Signed)
ANTICOAGULATION CONSULT NOTE - Follow Up Consult  Pharmacy Consult for Heparin Indication: elevated troponins, Afib  Allergies  Allergen Reactions  . Sulfa Drugs Cross Reactors Swelling    Patient Measurements: Height: 5\' 3"  (160 cm) Weight: 166 lb 14.4 oz (75.705 kg) (scale a) IBW/kg (Calculated) : 52.4 Heparin Dosing Weight: 68 kg  Vital Signs: Temp: 97.5 F (36.4 C) (05/23 2056) Temp Source: Oral (05/23 2056) BP: 122/48 mmHg (05/23 2056) Pulse Rate: 57 (05/23 2056)  Labs:  Recent Labs  12/21/15 0252 12/22/15 0243 12/23/15 0241 12/23/15 1137 12/23/15 1607 12/23/15 2114  HGB 11.5* 11.6* 11.4*  --   --   --   HCT 36.2 35.1* 35.0*  --   --   --   PLT 280 309 280  --   --   --   LABPROT  --   --   --   --  16.9*  --   INR  --   --   --   --  1.36  --   HEPARINUNFRC 0.65 0.59 0.81* 0.78*  --  0.67  CREATININE 2.05* 2.32* 1.29*  --   --   --     Estimated Creatinine Clearance: 33.3 mL/min (by C-G formula based on Cr of 1.29).  Assessment: 82 y.o. F initially started on heparin for r/o ACS, then continued for new onset Afib with CHA2DS2VASc = 7. Heparin level is now therapeutic on 1300 units/hr but on upper end of goal range. RN reports no s/s of bleeding   Goal of Therapy:  Heparin level 0.3-0.7 units/ml Monitor platelets by anticoagulation protocol: Yes   Plan:  Decrease heparin gtt to 1250 units/hr F/u 8 hr heparin level  Vinnie LevelBenjamin Alantis Bethune, PharmD., BCPS Clinical Pharmacist Pager [phone]

## 2015-12-24 ENCOUNTER — Inpatient Hospital Stay (HOSPITAL_COMMUNITY): Payer: Medicare Other

## 2015-12-24 DIAGNOSIS — J13 Pneumonia due to Streptococcus pneumoniae: Secondary | ICD-10-CM

## 2015-12-24 DIAGNOSIS — I481 Persistent atrial fibrillation: Secondary | ICD-10-CM

## 2015-12-24 DIAGNOSIS — I480 Paroxysmal atrial fibrillation: Secondary | ICD-10-CM

## 2015-12-24 LAB — GLUCOSE, CAPILLARY
GLUCOSE-CAPILLARY: 118 mg/dL — AB (ref 65–99)
GLUCOSE-CAPILLARY: 188 mg/dL — AB (ref 65–99)
GLUCOSE-CAPILLARY: 129 mg/dL — AB (ref 65–99)
Glucose-Capillary: 112 mg/dL — ABNORMAL HIGH (ref 65–99)

## 2015-12-24 LAB — LACTATE DEHYDROGENASE, PLEURAL OR PERITONEAL FLUID: LD, Fluid: 135 U/L — ABNORMAL HIGH (ref 3–23)

## 2015-12-24 LAB — PROTEIN, BODY FLUID: Total protein, fluid: <3 g/dL

## 2015-12-24 LAB — BASIC METABOLIC PANEL
Anion gap: 13 (ref 5–15)
BUN: 69 mg/dL — ABNORMAL HIGH (ref 6–20)
CALCIUM: 9.6 mg/dL (ref 8.9–10.3)
CO2: 19 mmol/L — ABNORMAL LOW (ref 22–32)
CREATININE: 1.96 mg/dL — AB (ref 0.44–1.00)
Chloride: 97 mmol/L — ABNORMAL LOW (ref 101–111)
GFR calc non Af Amer: 23 mL/min — ABNORMAL LOW (ref >60)
GFR, EST AFRICAN AMERICAN: 26 mL/min — AB (ref >60)
Glucose, Bld: 126 mg/dL — ABNORMAL HIGH (ref 65–99)
Potassium: 3.8 mmol/L (ref 3.5–5.1)
SODIUM: 129 mmol/L — AB (ref 135–145)

## 2015-12-24 LAB — BODY FLUID CELL COUNT WITH DIFFERENTIAL
Eos, Fluid: 0 %
LYMPHS FL: 61 %
MONOCYTE-MACROPHAGE-SEROUS FLUID: 2 % — AB (ref 50–90)
NEUTROPHIL FLUID: 37 % — AB (ref 0–25)
WBC FLUID: 765 uL (ref 0–1000)

## 2015-12-24 LAB — HEPARIN LEVEL (UNFRACTIONATED): Heparin Unfractionated: 0.43 IU/mL (ref 0.30–0.70)

## 2015-12-24 LAB — CBC
HCT: 34.4 % — ABNORMAL LOW (ref 36.0–46.0)
Hemoglobin: 11.2 g/dL — ABNORMAL LOW (ref 12.0–15.0)
MCH: 28.1 pg (ref 26.0–34.0)
MCHC: 32.6 g/dL (ref 30.0–36.0)
MCV: 86.4 fL (ref 78.0–100.0)
PLATELETS: 305 10*3/uL (ref 150–400)
RBC: 3.98 MIL/uL (ref 3.87–5.11)
RDW: 13.8 % (ref 11.5–15.5)
WBC: 12.2 10*3/uL — AB (ref 4.0–10.5)

## 2015-12-24 LAB — TACROLIMUS LEVEL: Tacrolimus (FK506) - LabCorp: 10.1 ng/mL (ref 2.0–20.0)

## 2015-12-24 LAB — PROTEIN, TOTAL: Total Protein: 6.2 g/dL — ABNORMAL LOW (ref 6.5–8.1)

## 2015-12-24 LAB — LACTATE DEHYDROGENASE: LDH: 160 U/L (ref 98–192)

## 2015-12-24 MED ORDER — HEPARIN (PORCINE) IN NACL 100-0.45 UNIT/ML-% IJ SOLN
1250.0000 [IU]/h | INTRAMUSCULAR | Status: DC
  Administered 2015-12-24 – 2015-12-26 (×3): 1250 [IU]/h via INTRAVENOUS
  Filled 2015-12-24 (×3): qty 250

## 2015-12-24 NOTE — Progress Notes (Signed)
PT Cancellation Note  Patient Details Name: Unknown FoleyMae W Bala MRN: [id number] DOB: 05/13/1932   Cancelled Treatment:    Reason Eval/Treat Not Completed: Patient not medically ready; RN reports pt for thoracentesis this morning.  Will attempt later today as time allows.    Elray McgregorCynthia Trelon Plush 12/24/2015, 9:51 AM  Sheran Lawlessyndi Fawna Cranmer, PT [phone] 12/24/2015

## 2015-12-24 NOTE — Progress Notes (Addendum)
Pleural fluid specimen sent to lab from thoracentesis per Dr. Tyson AliasFeinstein

## 2015-12-24 NOTE — Progress Notes (Signed)
PROGRESS NOTE                                                                                                                                                                                                             Patient Demographics:    [name], is a 82 y.o. female, DOB - 10-07-1931, ZOX:[id number]  Admit date - 12/18/2015   Admitting Physician Kathleene Hazelhristopher D McAlhany, MD  Outpatient Primary MD for the patient is No primary care provider on file.  LOS - 6  Outpatient Specialists: Nephrology cardiology  Chief Complaint  Patient presents with  . Shortness of Breath  . Chest Pain       Brief Narrative   82 y.o. female with a history of CAD, HTN, DM2, CKD s/p renal transplant in 2003, admitted on 5/18 after presenting with shortness of breath and chest pain, found to have A fib with RVR (CHADVASC 7), as well as NSTEMI from demand ischemia. Also had a pneumococcal pneumonia  Hospital course complicated due to development of acute hypoxic respiratory failure with pneumococcal pneumonia. Marland Kitchen. PCCM involving care. Nephrology consult and given worsened renal function. Patient transferred to hospitalist service for further care.   Subjective:   Continues to be extremely weak with minimal movement, continues to cough and tried to take in a deep breath, currently on 2 L of oxygen,   Assessment  & Plan :   Acute respiratory failure with hypoxia secondary to pneumococcal pneumonia and pleural effusion Worsening infiltrate and effusion on chest x-ray. Receiving empiric IV Rocephin (10 day course) started 5/18. Continue nebulizers, Mucinex and pulmonary toilet. Given worsened chest x-ray findings. PCCM  recommended CT scan that showed a large right pleural effusion with right lower lobe collapse and patchy airspace disease of the left lower lobe, finding suspicion for aspiration pneumonia, endobronchial lesion causing collapse  of the right lower lobe is also in the differential diagnosis Plan is for ultrasound-guided thoracentesis, will order per IR     Acute on Chronic kidney disease stage III  Continue Prograf. Creatinine 2.32>1.29> 1.96 suspected to be hemodynamically mediated with atrial fibrillation/rapid ventricular response vs ATN Status post renal transplant in 2003 maintained on Tac/MMF with baseline creatinine between 1.0-1.4.  A. fib with RVR Required Cardizem drip on admission. Now stable on oral Cardizem and metoprolol. 2-D echo showed normal EF.  now in sinus rhythm. On IV heparin with plan on starting NOAC once decided on thoracentesis.  NSTE MI Likely secondary to demand ischemia with rapid A. fib. On IV heparin. Plan on Lexiscan Myoview once acute issues resolve.  Acute on Chronic Diastolic CHF: BNP was abnormal at 1200 on admit. . Renal function fluctuating. She was on 80 mg PO lasix as an outpatient.  Currently Lasix on hold, follow BMP closely.   hold ARB. Strict I/Os.    diabetes mellitus Stable. Continue sliding scale coverage. Hemoglobin A1c 6.9   physical deconditioning Ordered PT evaluation   Code Status: full code Family Communication  :None at bedside Disposition Plan  : Pending further cardiology recommendations (stress test once respiratory function improved, PT evaluation)   Barriers For Discharge :   ongoing respiratory symptoms, pleural effusion, pending cardiac workup  Consults  :  Cardiology PC CM Renal   Procedures  :  2-D echo  DVT Prophylaxis  :  IV heparin  Lab Results  Component Value Date   PLT 305 12/24/2015    Antibiotics  :   Rocephin 5/18- Azithromycin 5/18-5/19      Objective:   Filed Vitals:   12/23/15 1940 12/23/15 2056 12/24/15 0031 12/24/15 0545  BP: 126/62 122/48 118/55 129/54  Pulse:  57 52 54  Temp: 98.1 F (36.7 C) 97.5 F (36.4 C) 97.5 F (36.4 C) 97.6 F (36.4 C)  TempSrc: Oral Oral Oral Oral  Resp: 21 18 18 18   Height:   5\' 3"  (1.6 m)    Weight:  75.705 kg (166 lb 14.4 oz)  75.841 kg (167 lb 3.2 oz)  SpO2: 95% 96% 95% 95%    Wt Readings from Last 3 Encounters:  12/24/15 75.841 kg (167 lb 3.2 oz)  08/17/14 70.126 kg (154 lb 9.6 oz)  03/20/14 75.501 kg (166 lb 7.2 oz)     Intake/Output Summary (Last 24 hours) at 12/24/15 0817 Last data filed at 12/24/15 0731  Gross per 24 hour  Intake 1035.31 ml  Output    600 ml  Net 435.31 ml     Physical Exam  Gen: not in distress, Appears fatigued  HEENT: no pallor, moist mucosa, supple neck chest: diminished right-sided breath sounds CVS: N S1&S2, no murmurs, rubs or gallop GI: soft, NT, ND, BS+ Musculoskeletal: warm, no edema CNS: AAOX3, non focal    Data Review:    CBC  Recent Labs Lab 12/18/15 0830  12/20/15 0800 12/21/15 0252 12/22/15 0243 12/23/15 0241 12/24/15 0320  WBC 22.0*  < > 14.9* 14.6* 13.2* 13.4* 12.2*  HGB 12.7  < > 11.8* 11.5* 11.6* 11.4* 11.2*  HCT 39.4  < > 35.9* 36.2 35.1* 35.0* 34.4*  PLT 190  < > 244 280 309 280 305  MCV 87.8  < > 87.1 88.7 85.8 86.4 86.4  MCH 28.3  < > 28.6 28.2 28.4 28.1 28.1  MCHC 32.2  < > 32.9 31.8 33.0 32.6 32.6  RDW 13.6  < > 13.6 13.8 13.8 13.9 13.8  LYMPHSABS 1.1  --   --   --   --   --   --   MONOABS 1.7*  --   --   --   --   --   --   EOSABS 0.0  --   --   --   --   --   --   BASOSABS 0.0  --   --   --   --   --   --   < > =  values in this interval not displayed.  Chemistries   Recent Labs Lab 12/18/15 0830 12/18/15 1330  12/20/15 0800 12/21/15 0252 12/22/15 0243 12/23/15 0241 12/24/15 0320  NA 134*  --   < > 131* 129* 128* 131* 129*  K 3.9  --   < > 3.7 3.9 3.9 3.6 3.8  CL 98*  --   < > 96* 95* 94* 98* 97*  CO2 20*  --   < > 22 17* 19* 20* 19*  GLUCOSE 166*  --   < > 166* 147* 137* 119* 126*  BUN 21*  --   < > 36* 47* 59* 70* 69*  CREATININE 1.35*  --   < > 1.54* 2.05* 2.32* 1.29* 1.96*  CALCIUM 10.0  --   < > 9.9 9.0 8.9 9.2 9.6  MG  --  2.0  --   --   --   --   --    --   AST 22  --   --   --   --   --  18  --   ALT 10*  --   --   --   --   --  11*  --   ALKPHOS 99  --   --   --   --   --  89  --   BILITOT 2.0*  --   --   --   --   --  0.4  --   < > = values in this interval not displayed. ------------------------------------------------------------------------------------------------------------------ No results for input(s): CHOL, HDL, LDLCALC, TRIG, CHOLHDL, LDLDIRECT in the last 72 hours.  Lab Results  Component Value Date   HGBA1C 6.9* 12/18/2015   ------------------------------------------------------------------------------------------------------------------ No results for input(s): TSH, T4TOTAL, T3FREE, THYROIDAB in the last 72 hours.  Invalid input(s): FREET3 ------------------------------------------------------------------------------------------------------------------ No results for input(s): VITAMINB12, FOLATE, FERRITIN, TIBC, IRON, RETICCTPCT in the last 72 hours.  Coagulation profile  Recent Labs Lab 12/23/15 1607  INR 1.36    No results for input(s): DDIMER in the last 72 hours.  Cardiac Enzymes  Recent Labs Lab 12/18/15 1330 12/18/15 1822 12/19/15 0124  TROPONINI 1.04* 1.26* 0.93*   ------------------------------------------------------------------------------------------------------------------    Component Value Date/Time   BNP 1232.3* 12/18/2015 0830    Inpatient Medications  Scheduled Meds: . acetylcysteine  4 mL Nebulization BID  . albuterol  2.5 mg Nebulization BID  . allopurinol  100 mg Oral QHS  . antiseptic oral rinse  7 mL Mouth Rinse BID  . aspirin EC  81 mg Oral Daily  . atorvastatin  10 mg Oral q1800  . cefTRIAXone (ROCEPHIN)  IV  2 g Intravenous Q24H  . cinacalcet  30 mg Oral Q supper  . diltiazem  240 mg Oral Daily  . famotidine  20 mg Oral BID  . guaiFENesin  1,200 mg Oral BID  . insulin aspart  0-15 Units Subcutaneous TID WC  . metoprolol  100 mg Oral BID  . mycophenolate  500 mg  Oral BID  . sodium chloride flush  3 mL Intravenous Q12H  . tacrolimus  1.5 mg Oral QHS  . tacrolimus  1.5 mg Oral q morning - 10a   Continuous Infusions: . sodium chloride 10 mL/hr (12/24/15 0732)  . diltiazem (CARDIZEM) infusion Stopped (12/21/15 0800)   PRN Meds:.sodium chloride, acetaminophen, albuterol, nitroGLYCERIN, ondansetron (ZOFRAN) IV, senna-docusate, sodium chloride flush, traMADol  Micro Results Recent Results (from the past 240 hour(s))  Culture, blood (Routine X 2) w Reflex to ID Panel  Status: Abnormal   Collection Time: 12/18/15 10:49 AM  Result Value Ref Range Status   Specimen Description BLOOD RIGHT HAND  Final   Special Requests BOTTLES DRAWN AEROBIC AND ANAEROBIC  5CC  Final   Culture  Setup Time   Final    GRAM POSITIVE COCCI IN CHAINS IN PAIRS IN BOTH AEROBIC AND ANAEROBIC BOTTLES Organism ID to follow CRITICAL RESULT CALLED TO, READ BACK BY AND VERIFIED WITH: CARON AMEND,PHARMD@0050  12/19/15 MKELLY    Culture STREPTOCOCCUS PNEUMONIAE (A)  Final   Report Status 12/21/2015 FINAL  Final   Organism ID, Bacteria STREPTOCOCCUS PNEUMONIAE  Final      Susceptibility   Streptococcus pneumoniae - MIC*    ERYTHROMYCIN 2 RESISTANT Resistant     LEVOFLOXACIN 0.5 SENSITIVE Sensitive     VANCOMYCIN 0.5 SENSITIVE Sensitive     PENICILLIN 0.25 SENSITIVE Sensitive     CEFTRIAXONE 0.25 SENSITIVE Sensitive     * STREPTOCOCCUS PNEUMONIAE  Blood Culture ID Panel (Reflexed)     Status: Abnormal   Collection Time: 12/18/15 10:49 AM  Result Value Ref Range Status   Enterococcus species NOT DETECTED NOT DETECTED Final   Vancomycin resistance NOT DETECTED NOT DETECTED Final   Listeria monocytogenes NOT DETECTED NOT DETECTED Final   Staphylococcus species NOT DETECTED NOT DETECTED Final   Staphylococcus aureus NOT DETECTED NOT DETECTED Final   Methicillin resistance NOT DETECTED NOT DETECTED Final   Streptococcus species NOT DETECTED NOT DETECTED Final   Streptococcus  agalactiae NOT DETECTED NOT DETECTED Final   Streptococcus pneumoniae DETECTED (A) NOT DETECTED Final    Comment: CRITICAL RESULT CALLED TO, READ BACK BY AND VERIFIED WITH: CARON AMEND,PHARMD @0050  12/19/15 MKELLY    Streptococcus pyogenes NOT DETECTED NOT DETECTED Final   Acinetobacter baumannii NOT DETECTED NOT DETECTED Final   Enterobacteriaceae species NOT DETECTED NOT DETECTED Final   Enterobacter cloacae complex NOT DETECTED NOT DETECTED Final   Escherichia coli NOT DETECTED NOT DETECTED Final   Klebsiella oxytoca NOT DETECTED NOT DETECTED Final   Klebsiella pneumoniae NOT DETECTED NOT DETECTED Final   Proteus species NOT DETECTED NOT DETECTED Final   Serratia marcescens NOT DETECTED NOT DETECTED Final   Carbapenem resistance NOT DETECTED NOT DETECTED Final   Haemophilus influenzae NOT DETECTED NOT DETECTED Final   Neisseria meningitidis NOT DETECTED NOT DETECTED Final   Pseudomonas aeruginosa NOT DETECTED NOT DETECTED Final   Candida albicans NOT DETECTED NOT DETECTED Final   Candida glabrata NOT DETECTED NOT DETECTED Final   Candida krusei NOT DETECTED NOT DETECTED Final   Candida parapsilosis NOT DETECTED NOT DETECTED Final   Candida tropicalis NOT DETECTED NOT DETECTED Final  Culture, blood (Routine X 2) w Reflex to ID Panel     Status: Abnormal   Collection Time: 12/18/15 10:59 AM  Result Value Ref Range Status   Specimen Description BLOOD LEFT ANTECUBITAL  Final   Special Requests BOTTLES DRAWN AEROBIC AND ANAEROBIC  5CC  Final   Culture  Setup Time   Final    GRAM POSITIVE COCCI IN PAIRS IN CHAINS IN BOTH AEROBIC AND ANAEROBIC BOTTLES CRITICAL RESULT CALLED TO, READ BACK BY AND VERIFIED WITH: CARON AMEND,PHARMD @0050  12/19/15 KELLYM    Culture (A)  Final    STREPTOCOCCUS PNEUMONIAE SUSCEPTIBILITIES PERFORMED ON PREVIOUS CULTURE WITHIN THE LAST 5 DAYS.    Report Status 12/21/2015 FINAL  Final  MRSA PCR Screening     Status: Abnormal   Collection Time: 12/18/15  9:20  PM  Result Value Ref  Range Status   MRSA by PCR POSITIVE (A) NEGATIVE Final    Comment:        The GeneXpert MRSA Assay (FDA approved for NASAL specimens only), is one component of a comprehensive MRSA colonization surveillance program. It is not intended to diagnose MRSA infection nor to guide or monitor treatment for MRSA infections. RESULT CALLED TO, READ BACK BY AND VERIFIED WITH: S ROUSE,RN  12/19/15 MKELLY   Culture, blood (Routine X 2) w Reflex to ID Panel     Status: None (Preliminary result)   Collection Time: 12/22/15 10:58 AM  Result Value Ref Range Status   Specimen Description BLOOD RIGHT HAND  Final   Special Requests IN PEDIATRIC BOTTLE  3CC  Final   Culture NO GROWTH 1 DAY  Final   Report Status PENDING  Incomplete  Culture, blood (Routine X 2) w Reflex to ID Panel     Status: None (Preliminary result)   Collection Time: 12/22/15 11:00 AM  Result Value Ref Range Status   Specimen Description BLOOD RIGHT HAND  Final   Special Requests IN PEDIATRIC BOTTLE 3.0CC  Final   Culture NO GROWTH 1 DAY  Final   Report Status PENDING  Incomplete    Radiology Reports Ct Chest Wo Contrast  12/23/2015  CLINICAL DATA:  Shortness of breath. Pleural effusion. Congestive heart failure and diabetes. EXAM: CT CHEST WITHOUT CONTRAST TECHNIQUE: Multidetector CT imaging of the chest was performed following the standard protocol without IV contrast. COMPARISON:  Chest radiograph dated 12/23/2015 FINDINGS: Mediastinum/Lymph Nodes: No masses or pathologically enlarged lymph nodes identified on this un-enhanced exam. The heart is enlarged. There is a small pericardial effusion. Atherosclerotic disease of the coronary arteries and thoracic aorta is noted. Layering material fills the esophagus dependently. Lungs/Pleura: There is a moderate to large water density right pleural effusion. There is right lower lobe airspace consolidation versus lobar atelectasis. There is patchy airspace  consolidation in the basilar segments of the left lower lobe. Upper abdomen: No acute findings. Musculoskeletal: No chest wall mass or suspicious bone lesions identified. IMPRESSION: Large right pleural effusion with right lower lobe collapse. Patchy airspace disease within the dependent segments of the left lower lobe. Heterogeneous material filling the distal 2/3 of the esophagus. These findings raise the possibility of aspiration pneumonia. Endobronchial lesion causing collapse of the right lower lobe is also in the differential diagnosis, although one is not visualized on this nonenhanced exam. Alternatively the large right pleural effusion may be causing secondary compressive atelectasis of the right lower lobe. Enlarged cardiac silhouette. Atherosclerotic disease of the coronary arteries and aorta. Electronically Signed   By: Ted Mcalpine [name]   On: 12/23/2015 14:41   Dg Chest Port 1 View  12/24/2015  CLINICAL DATA:  Shortness of breath.  Pleural effusion . EXAM: PORTABLE CHEST 1 VIEW COMPARISON:  CT 12/23/2015.  Chest x-ray 12/23/2015. FINDINGS: Mediastinum hilar structures are stable. Stable cardiomegaly. Stable right lower lobe atelectasis and/or consolidation and right pleural effusion. Left lung is clear. No pneumothorax. IMPRESSION: 1. Stable right lower lobe atelectasis and/or consolidation and right pleural effusion. 2. Stable cardiomegaly. Electronically Signed   By: Maisie Fus  Register   On: 12/24/2015 07:11   Dg Chest Port 1 View  12/23/2015  CLINICAL DATA:  Pneumonia. EXAM: PORTABLE CHEST 1 VIEW COMPARISON:  12/22/2015. FINDINGS: Stable cardiomegaly. Progressive infiltrate right lower lobe. Progressive right pleural effusion. Mild left base subsegmental atelectasis and or infiltrate. Left hemidiaphragm not imaged. No pneumothorax. IMPRESSION: 1. Progressive right lower  lobe infiltrate and right pleural effusion. 2.  Left base subsegmental atelectasis and or mild infiltrate. 3.   Cardiomegaly.  No pulmonary venous congestion . Electronically Signed   By: Maisie Fus  Register   On: 12/23/2015 07:18   Dg Chest Port 1 View  12/22/2015  CLINICAL DATA:  Continued assessment of pleural effusion. EXAM: PORTABLE CHEST 1 VIEW COMPARISON:  12/21/2015. FINDINGS: Cardiomegaly. Large RIGHT pleural effusion is increased. Compressive atelectasis at the RIGHT base. Trace LEFT effusion may be improved. Overall improved vascular congestion. IMPRESSION: Increasing RIGHT pleural effusion otherwise improved exam. Electronically Signed   By: Elsie Stain [name]   On: 12/22/2015 07:27   Dg Chest Port 1 View  12/21/2015  CLINICAL DATA:  Community-acquired pneumonia EXAM: PORTABLE CHEST 1 VIEW COMPARISON:  Chest radiograph from one day prior. FINDINGS: Stable cardiomediastinal silhouette with mild cardiomegaly. No pneumothorax. Moderate right and small left pleural effusions, increased bilaterally. Mild pulmonary edema, increased. Patchy bibasilar lung consolidation, right greater than left, slightly worsened bilaterally. IMPRESSION: 1. Patchy bibasilar lung consolidation, right greater than left, slightly worsened bilaterally, likely a combination of atelectasis and pneumonia. 2. Stable mild cardiomegaly with increased mild pulmonary edema. 3. Moderate right and small left pleural effusions, increased bilaterally. Electronically Signed   By: Delbert Phenix [name]   On: 12/21/2015 08:39   Dg Chest Port 1 View  12/20/2015  CLINICAL DATA:  Shortness of breath EXAM: PORTABLE CHEST 1 VIEW COMPARISON:  12/18/2015 FINDINGS: New right lower lung opacity, likely combination of atelectasis/ pneumonia and layering pleural effusion. Mild left basilar opacity, possibly atelectasis. Cardiomegaly.  No frank interstitial edema. No pneumothorax. IMPRESSION: New right lower lung opacity, likely combination of atelectasis/ pneumonia and layering pleural effusion. Mild left basilar opacity, possibly atelectasis. Electronically Signed    By: Charline Bills [name]   On: 12/20/2015 11:53   Dg Chest Port 1 View  12/18/2015  CLINICAL DATA:  Productive cough for 2 weeks, shortness of breath, headache EXAM: PORTABLE CHEST 1 VIEW COMPARISON:  08/11/2014 FINDINGS: Borderline cardiomegaly. No pulmonary edema. There is streaky left base retrocardiac atelectasis or early infiltrate. Atherosclerotic but for sclerotic calcifications of thoracic aorta. IMPRESSION: Streaky left base retrocardiac atelectasis or early infiltrate. No pulmonary edema. Follow-up to resolution recommended. Electronically Signed   By: Natasha Mead [name]   On: 12/18/2015 09:38    Time Spent in minutes  35   [name] M.D on 12/24/2015 at 8:17 AM  Between 7am to 7pm - Pager - [phone]  After 7pm go to [email or web address] - password [email or web address]  [facility] -  Office  [phone]

## 2015-12-24 NOTE — Progress Notes (Signed)
Patient Name: [name] Date of Encounter: 12/24/2015  Hospital Problem List     Principal Problem:   Pneumococcal pneumonia [facility](HCC) Active Problems:   CAD S/P remote RCA PCI   Bacteremia   CAP (community acquired pneumonia)   Pleural effusion on right   Diastolic CHF (HCC)   CKD (chronic kidney disease) stage 3, GFR 30-59 ml/min   PAF (paroxysmal atrial fibrillation) (HCC)   S/P kidney transplant   Hypertension   Elevated troponin    Subjective   S/p thoracentesis on the right this AM.  Says she can already tell she's moving more air - breathing improved.  No chest pain.  Inpatient Medications    . acetylcysteine  4 mL Nebulization BID  . albuterol  2.5 mg Nebulization BID  . allopurinol  100 mg Oral QHS  . antiseptic oral rinse  7 mL Mouth Rinse BID  . aspirin EC  81 mg Oral Daily  . atorvastatin  10 mg Oral q1800  . cefTRIAXone (ROCEPHIN)  IV  2 g Intravenous Q24H  . cinacalcet  30 mg Oral Q supper  . diltiazem  240 mg Oral Daily  . famotidine  20 mg Oral BID  . guaiFENesin  1,200 mg Oral BID  . insulin aspart  0-15 Units Subcutaneous TID WC  . metoprolol  100 mg Oral BID  . mycophenolate  500 mg Oral BID  . sodium chloride flush  3 mL Intravenous Q12H  . tacrolimus  1.5 mg Oral QHS  . tacrolimus  1.5 mg Oral q morning - 10a    Vital Signs    Filed Vitals:   12/24/15 0031 12/24/15 0545 12/24/15 0950 12/24/15 1230  BP: 118/55 129/54  105/52  Pulse: 52 54  52  Temp: 97.5 F (36.4 C) 97.6 F (36.4 C)  97.8 F (36.6 C)  TempSrc: Oral Oral  Oral  Resp: 18 18  18   Height:      Weight:  167 lb 3.2 oz (75.841 kg)    SpO2: 95% 95% 96% 99%    Intake/Output Summary (Last 24 hours) at 12/24/15 1319 Last data filed at 12/24/15 1011  Gross per 24 hour  Intake 1090.31 ml  Output    600 ml  Net 490.31 ml   Filed Weights   12/23/15 0355 12/23/15 2056 12/24/15 0545  Weight: 180 lb 8 oz (81.874 kg) 166 lb 14.4 oz (75.705 kg) 167 lb 3.2 oz (75.841 kg)     Physical Exam    General: Pleasant, NAD. Neuro: Alert and oriented X 3. Moves all extremities spontaneously. Psych: Normal affect. HEENT:  Normal  Neck: Supple without bruits or JVD. Lungs:  Resp regular and unlabored, crackles right base, otw cta. Heart: RRR no s3, s4, or murmurs. Abdomen: Soft, non-tender, non-distended, BS + x 4.  Extremities: No clubbing, cyanosis or edema. DP/PT/Radials 2+ and equal bilaterally.  Labs    CBC  Recent Labs  12/23/15 0241 12/24/15 0320  WBC 13.4* 12.2*  HGB 11.4* 11.2*  HCT 35.0* 34.4*  MCV 86.4 86.4  PLT 280 305   Basic Metabolic Panel  Recent Labs  12/23/15 0241 12/24/15 0320  NA 131* 129*  K 3.6 3.8  CL 98* 97*  CO2 20* 19*  GLUCOSE 119* 126*  BUN 70* 69*  CREATININE 1.29* 1.96*  CALCIUM 9.2 9.6   Liver Function Tests  Recent Labs  12/23/15 0241  AST 18  ALT 11*  ALKPHOS 89  BILITOT 0.4  PROT 6.1*  ALBUMIN 2.8*    Telemetry    Sinus rhythm, pvc's, brief run of AF overnight.  Radiology    Ct Chest Wo Contrast  12/23/2015  CLINICAL DATA:  Shortness of breath. Pleural effusion. Congestive heart failure and diabetes. EXAM: CT CHEST WITHOUT CONTRAST TECHNIQUE: Multidetector CT imaging of the chest was performed following the standard protocol without IV contrast. COMPARISON:  Chest radiograph dated 12/23/2015 FINDINGS: Mediastinum/Lymph Nodes: No masses or pathologically enlarged lymph nodes identified on this un-enhanced exam. The heart is enlarged. There is a small pericardial effusion. Atherosclerotic disease of the coronary arteries and thoracic aorta is noted. Layering material fills the esophagus dependently. Lungs/Pleura: There is a moderate to large water density right pleural effusion. There is right lower lobe airspace consolidation versus lobar atelectasis. There is patchy airspace consolidation in the basilar segments of the left lower lobe. Upper abdomen: No acute findings. Musculoskeletal: No chest wall  mass or suspicious bone lesions identified. IMPRESSION: Large right pleural effusion with right lower lobe collapse. Patchy airspace disease within the dependent segments of the left lower lobe. Heterogeneous material filling the distal 2/3 of the esophagus. These findings raise the possibility of aspiration pneumonia. Endobronchial lesion causing collapse of the right lower lobe is also in the differential diagnosis, although one is not visualized on this nonenhanced exam. Alternatively the large right pleural effusion may be causing secondary compressive atelectasis of the right lower lobe. Enlarged cardiac silhouette. Atherosclerotic disease of the coronary arteries and aorta. Electronically Signed   By: Ted Mcalpine [name]   On: 12/23/2015 14:41   Dg Chest Port 1 View  12/24/2015  CLINICAL DATA:  Shortness of breath.  Pleural effusion . EXAM: PORTABLE CHEST 1 VIEW COMPARISON:  CT 12/23/2015.  Chest x-ray 12/23/2015. FINDINGS: Mediastinum hilar structures are stable. Stable cardiomegaly. Stable right lower lobe atelectasis and/or consolidation and right pleural effusion. Left lung is clear. No pneumothorax. IMPRESSION: 1. Stable right lower lobe atelectasis and/or consolidation and right pleural effusion. 2. Stable cardiomegaly. Electronically Signed   By: Maisie Fus  Register   On: 12/24/2015 07:11   Dg Chest Port 1 View  12/23/2015  CLINICAL DATA:  Pneumonia. EXAM: PORTABLE CHEST 1 VIEW COMPARISON:  12/22/2015. FINDINGS: Stable cardiomegaly. Progressive infiltrate right lower lobe. Progressive right pleural effusion. Mild left base subsegmental atelectasis and or infiltrate. Left hemidiaphragm not imaged. No pneumothorax. IMPRESSION: 1. Progressive right lower lobe infiltrate and right pleural effusion. 2.  Left base subsegmental atelectasis and or mild infiltrate. 3.  Cardiomegaly.  No pulmonary venous congestion . Electronically Signed   By: Maisie Fus  Register   On: 12/23/2015 07:18   Dg Chest Port 1  View  12/22/2015  CLINICAL DATA:  Continued assessment of pleural effusion. EXAM: PORTABLE CHEST 1 VIEW COMPARISON:  12/21/2015. FINDINGS: Cardiomegaly. Large RIGHT pleural effusion is increased. Compressive atelectasis at the RIGHT base. Trace LEFT effusion may be improved. Overall improved vascular congestion. IMPRESSION: Increasing RIGHT pleural effusion otherwise improved exam. Electronically Signed   By: Elsie Stain [name]   On: 12/22/2015 07:27   Dg Chest Port 1 View  12/21/2015  CLINICAL DATA:  Community-acquired pneumonia EXAM: PORTABLE CHEST 1 VIEW COMPARISON:  Chest radiograph from one day prior. FINDINGS: Stable cardiomediastinal silhouette with mild cardiomegaly. No pneumothorax. Moderate right and small left pleural effusions, increased bilaterally. Mild pulmonary edema, increased. Patchy bibasilar lung consolidation, right greater than left, slightly worsened bilaterally. IMPRESSION: 1. Patchy bibasilar lung consolidation, right greater than left, slightly worsened bilaterally, likely a combination of atelectasis and pneumonia.  2. Stable mild cardiomegaly with increased mild pulmonary edema. 3. Moderate right and small left pleural effusions, increased bilaterally. Electronically Signed   By: Delbert Phenix [name]   On: 12/21/2015 08:39   Dg Chest Port 1 View  12/20/2015  CLINICAL DATA:  Shortness of breath EXAM: PORTABLE CHEST 1 VIEW COMPARISON:  12/18/2015 FINDINGS: New right lower lung opacity, likely combination of atelectasis/ pneumonia and layering pleural effusion. Mild left basilar opacity, possibly atelectasis. Cardiomegaly.  No frank interstitial edema. No pneumothorax. IMPRESSION: New right lower lung opacity, likely combination of atelectasis/ pneumonia and layering pleural effusion. Mild left basilar opacity, possibly atelectasis. Electronically Signed   By: Charline Bills [name]   On: 12/20/2015 11:53   Dg Chest Port 1 View  12/18/2015  CLINICAL DATA:  Productive cough for 2 weeks,  shortness of breath, headache EXAM: PORTABLE CHEST 1 VIEW COMPARISON:  08/11/2014 FINDINGS: Borderline cardiomegaly. No pulmonary edema. There is streaky left base retrocardiac atelectasis or early infiltrate. Atherosclerotic but for sclerotic calcifications of thoracic aorta. IMPRESSION: Streaky left base retrocardiac atelectasis or early infiltrate. No pulmonary edema. Follow-up to resolution recommended. Electronically Signed   By: Natasha Mead [name]   On: 12/18/2015 09:38    Assessment & Plan    1.  PAF:  Overall maintaining sinus though did have a few second run of AF overnight.  Cont  blocker/dilt.  Will likely need long term oral anticoagulation (CHA2DS2VASc = 7) but hold off until procedures completed.  2.  Acute resp failure/Pneumococcal PNA/Rt pleural effusion: IM/CCM following.  Remains on rocephin. Afebrile, wbc trending down.  S/p thoracentesis today.  Path/cytology pending.  3.  Elevated troponin:  In setting of above.  No further chest pain.  Plan for myoview for risk stratification once PNA/resp status stable.  In setting of acute on chronic renal failure w/ h/o transplant, would only pursue cath if MV high risk.  Cont asa, statin,  blocker.  4.  CKD III s/p prior renal tx: Nephrology following.  Creat up today c/w yesterday.  Signed, Nicolasa Ducking NP

## 2015-12-24 NOTE — Evaluation (Signed)
Physical Therapy Evaluation Patient Details Name: [name] MRN: [id number] DOB: 02/03/1932 Today's Date: 12/24/2015   History of Present Illness  [name] is a 82 y.o. female with a Past Medical History of FSGS status post transplant, CAD status post cardiac catheterization, HTN, cholelithiasis, CHF, gout, C KD, DM who presents with A. fib with RVR and evidence of demand ischemia.  Clinical Impression  Patient presents with weakness and SOB limiting mobility in the room this afternoon.  Feels some better after thoracentesis earlier today.  Feel she should improve to be able to go home with intermittent assist, but if not may need STSNF as she lives alone.  PT to follow acutely to address deficits noted in PT problem list and aide in progressing to d/c home.    Follow Up Recommendations Home health PT;Supervision - Intermittent    Equipment Recommendations  None recommended by PT    Recommendations for Other Services       Precautions / Restrictions Precautions Precautions: Fall Precaution Comments: O2 dependent      Mobility  Bed Mobility Overal bed mobility: Needs Assistance Bed Mobility: Supine to Sit;Sit to Supine     Supine to sit: Min assist;HOB elevated Sit to supine: Supervision   General bed mobility comments: use of rail to assist into bed, assist to lift trunk to come upright  Transfers Overall transfer level: Needs assistance Equipment used: Rolling walker (2 wheeled) Transfers: Sit to/from Stand Sit to Stand: Supervision         General transfer comment: rises with assist for safety/lines  Ambulation/Gait Ambulation/Gait assistance: Min guard Ambulation Distance (Feet): 12 Feet Assistive device: Rolling walker (2 wheeled) Gait Pattern/deviations: Step-through pattern;Trunk flexed;Shuffle;Decreased stride length     General Gait Details: limited by SOB, weakness 6' forward, 6' back  Stairs            Wheelchair Mobility    Modified  Rankin (Stroke Patients Only)       Balance Overall balance assessment: Needs assistance           Standing balance-Leahy Scale: Poor Standing balance comment: holding onto bed rail or walker for static balance                             Pertinent Vitals/Pain Pain Assessment: No/denies pain    Home Living Family/patient expects to be discharged to:: Private residence Living Arrangements: Alone Available Help at Discharge: Family Type of Home: Apartment Home Access: Level entry     Home Layout: One level Home Equipment: Environmental consultantWalker - 4 wheels;Cane - single point;Shower seat;Hand held shower head      Prior Function Level of Independence: Independent         Comments: uses cane when knees bothering her     Hand Dominance   Dominant Hand: Right    Extremity/Trunk Assessment   Upper Extremity Assessment: RUE deficits/detail RUE Deficits / Details: AAROM limited by pain/weakness, reports longstanding since AV fisutla, but new pain in wrist         Lower Extremity Assessment: Generalized weakness      Cervical / Trunk Assessment: Kyphotic  Communication   Communication: No difficulties  Cognition Arousal/Alertness: Awake/alert Behavior During Therapy: WFL for tasks assessed/performed Overall Cognitive Status: Within Functional Limits for tasks assessed                      General Comments  Exercises        Assessment/Plan    PT Assessment Patient needs continued PT services  PT Diagnosis Generalized weakness;Difficulty walking   PT Problem List Decreased strength;Decreased activity tolerance;Cardiopulmonary status limiting activity;Decreased mobility;Decreased knowledge of use of DME  PT Treatment Interventions DME instruction;Gait training;Balance training;Stair training;Functional mobility training;Patient/family education;Therapeutic activities;Therapeutic exercise   PT Goals (Current goals can be found in the Care Plan  section) Acute Rehab PT Goals Patient Stated Goal: To go home in few days PT Goal Formulation: With patient Time For Goal Achievement: 12/31/15 Potential to Achieve Goals: Fair    Frequency Min 3X/week   Barriers to discharge Decreased caregiver support      Co-evaluation               End of Session Equipment Utilized During Treatment: Oxygen Activity Tolerance: Patient limited by fatigue Patient left: in bed;with call bell/phone within reach;with bed alarm set           Time: 1610-9604 PT Time Calculation (min) (ACUTE ONLY): 25 min   Charges:   PT Evaluation $PT Eval Moderate Complexity: 1 Procedure PT Treatments $Gait Training: 8-22 mins   PT G CodesElray Mcgregor 2016-01-10, 4:51 PM Sheran Lawless, PT [phone] January 10, 2016

## 2015-12-24 NOTE — Progress Notes (Signed)
eLink Physician-Brief Progress Note Patient Name: Unknown FoleyMae W Bowen DOB: 05-17-1932 MRN: [id number]   Date of Service  12/24/2015  HPI/Events of Note  Very unusual call from the bedside nurse who reports that there are Thoracentesis specimens still at the bedside. I question if  the appropriate studies sent to the lab?  eICU Interventions  Will order: 1. Nurse will check with the lab to see if the studies were sent/recieved. 2. If the ordered studies were not sent/received by the lab, please send the studies as ordered as long as the specimens are still acceptable for the lab to process.     Intervention Category Minor Interventions: Communication with other healthcare providers and/or family  Lenell AntuSommer,Steven Eugene 12/24/2015, 8:54 PM

## 2015-12-24 NOTE — Progress Notes (Signed)
ANTICOAGULATION CONSULT NOTE - Follow Up Consult  Pharmacy Consult for Heparin Indication: elevated troponins, Afib  Allergies  Allergen Reactions  . Sulfa Drugs Cross Reactors Swelling    Patient Measurements: Height: 5\' 3"  (160 cm) Weight: 167 lb 3.2 oz (75.841 kg) (scale a) IBW/kg (Calculated) : 52.4 Heparin Dosing Weight: 68 kg  Vital Signs: Temp: 97.8 F (36.6 C) (05/24 1230) Temp Source: Oral (05/24 1230) BP: 105/52 mmHg (05/24 1230) Pulse Rate: 52 (05/24 1230)  Labs:  Recent Labs  12/22/15 0243 12/23/15 0241 12/23/15 1137 12/23/15 1607 12/23/15 2114 12/24/15 0320  HGB 11.6* 11.4*  --   --   --  11.2*  HCT 35.1* 35.0*  --   --   --  34.4*  PLT 309 280  --   --   --  305  LABPROT  --   --   --  16.9*  --   --   INR  --   --   --  1.36  --   --   HEPARINUNFRC 0.59 0.81* 0.78*  --  0.67 0.43  CREATININE 2.32* 1.29*  --   --   --  1.96*    Estimated Creatinine Clearance: 22 mL/min (by C-G formula based on Cr of 1.96).  Assessment: 82 y.o. F initially started on heparin for r/o ACS, then continued for new onset Afib with CHA2DS2VASc = 7.  Heparin to resume s/p thoracentesis at 6 pm Planning conversion to NOAC once procedures complete   Goal of Therapy:  Heparin level 0.3-0.7 units/ml Monitor platelets by anticoagulation protocol: Yes   Plan:  Resume heparin at 1800 pm at 1250 units / hr (level previously therapeutic at that rate) Follow up AM labs  Thank you Okey RegalLisa Evianna Chandran, PharmD [phone]

## 2015-12-24 NOTE — Procedures (Signed)
Thoracentesis Procedure Note  Pre-operative Diagnosis: rt effusion, parpneuonic?  Post-operative Diagnosis: same  Indications: there and diag  Procedure Details  Consent: Informed consent was obtained. Risks of the procedure were discussed including: infection, bleeding, pain, pneumothorax.  Under sterile conditions the patient was positioned. Betadine solution and sterile drapes were utilized.  1% buffered lidocaine was used to anesthetize the 6th rib space. Fluid was obtained without any difficulties and minimal blood loss.  A dressing was applied to the wound and wound care instructions were provided.   Findings 1,450 ml of clear pleural fluid was obtained. A sample was sent to Pathology for cytogenetics, flow, and cell counts, as well as for infection analysis.  Complications:  None; patient tolerated the procedure well.          Condition: stable  Plan A follow up chest x-ray was ordered. Bed Rest for 2 hours. Tylenol 650 mg. for pain.  Attending Attestation: I performed the procedure.     US guidance Small initital nick bleeding, sutured closed, will remove firday New dermatomy done above

## 2015-12-24 NOTE — Progress Notes (Signed)
PULMONARY / CRITICAL CARE MEDICINE   Name: [name] MRN: [id number] DOB: 06/21/1932    ADMISSION DATE:  12/18/2015 CONSULTATION DATE:  12/20/2015  REFERRING MD:  Dr. Anne Fu  CHIEF COMPLAINT:  Short of breath  SUBJECTIVE:  Pt denies acute complaints.  Concerned about the process for thoracentesis.    VITAL SIGNS: BP 105/52 mmHg  Pulse 52  Temp(Src) 97.8 F (36.6 C) (Oral)  Resp 18  Ht 5\' 3"  (1.6 m)  Wt 167 lb 3.2 oz (75.841 kg)  BMI 29.63 kg/m2  SpO2 99%  INTAKE / OUTPUT: I/O last 3 completed shifts: In: 1419.9 [P.O.:410; I.V.:909.9; IV Piggyback:100] Out: 600 [Urine:600]  PHYSICAL EXAMINATION: General:  Elderly female in NAD, alert Neuro:  Normal strength, AAOx4 HEENT:  no stridor, jvd Cardiovascular: s1 s2 RR irregular  Lungs:  Diminished on R lower, left clear, moist cough Abdomen:  Soft, non tender Musculoskeletal:  No edema Skin:  No rashes  LABS:  BMET  Recent Labs Lab 12/22/15 0243 12/23/15 0241 12/24/15 0320  NA 128* 131* 129*  K 3.9 3.6 3.8  CL 94* 98* 97*  CO2 19* 20* 19*  BUN 59* 70* 69*  CREATININE 2.32* 1.29* 1.96*  GLUCOSE 137* 119* 126*    Electrolytes  Recent Labs Lab 12/18/15 1330  12/22/15 0243 12/23/15 0241 12/24/15 0320  CALCIUM  --   < > 8.9 9.2 9.6  MG 2.0  --   --   --   --   < > = values in this interval not displayed.  CBC  Recent Labs Lab 12/22/15 0243 12/23/15 0241 12/24/15 0320  WBC 13.2* 13.4* 12.2*  HGB 11.6* 11.4* 11.2*  HCT 35.1* 35.0* 34.4*  PLT 309 280 305    Coag's  Recent Labs Lab 12/23/15 1607  INR 1.36    Sepsis Markers  Recent Labs Lab 12/18/15 1055  LATICACIDVEN 1.48    ABG  Recent Labs Lab 12/20/15 1109  PHART 7.474*  PCO2ART 25.3*  PO2ART 69.1*    Liver Enzymes  Recent Labs Lab 12/18/15 0830 12/23/15 0241  AST 22 18  ALT 10* 11*  ALKPHOS 99 89  BILITOT 2.0* 0.4  ALBUMIN 2.8* 2.8*    Cardiac Enzymes  Recent Labs Lab 12/18/15 1330 12/18/15 1822  12/19/15 0124  TROPONINI 1.04* 1.26* 0.93*    Glucose  Recent Labs Lab 12/22/15 2129 12/23/15 0820 12/23/15 1115 12/23/15 1656 12/24/15 0543 12/24/15 1134  GLUCAP 126* 103* 123* 111* 118* 188*    Imaging Ct Chest Wo Contrast  12/23/2015  CLINICAL DATA:  Shortness of breath. Pleural effusion. Congestive heart failure and diabetes. EXAM: CT CHEST WITHOUT CONTRAST TECHNIQUE: Multidetector CT imaging of the chest was performed following the standard protocol without IV contrast. COMPARISON:  Chest radiograph dated 12/23/2015 FINDINGS: Mediastinum/Lymph Nodes: No masses or pathologically enlarged lymph nodes identified on this un-enhanced exam. The heart is enlarged. There is a small pericardial effusion. Atherosclerotic disease of the coronary arteries and thoracic aorta is noted. Layering material fills the esophagus dependently. Lungs/Pleura: There is a moderate to large water density right pleural effusion. There is right lower lobe airspace consolidation versus lobar atelectasis. There is patchy airspace consolidation in the basilar segments of the left lower lobe. Upper abdomen: No acute findings. Musculoskeletal: No chest wall mass or suspicious bone lesions identified. IMPRESSION: Large right pleural effusion with right lower lobe collapse. Patchy airspace disease within the dependent segments of the left lower lobe. Heterogeneous material filling the distal 2/3 of the esophagus. These  findings raise the possibility of aspiration pneumonia. Endobronchial lesion causing collapse of the right lower lobe is also in the differential diagnosis, although one is not visualized on this nonenhanced exam. Alternatively the large right pleural effusion may be causing secondary compressive atelectasis of the right lower lobe. Enlarged cardiac silhouette. Atherosclerotic disease of the coronary arteries and aorta. Electronically Signed   By: Ted Mcalpineobrinka  Dimitrova [name]   On: 12/23/2015 14:41   Dg Chest Port  1 View  12/24/2015  CLINICAL DATA:  Shortness of breath.  Pleural effusion . EXAM: PORTABLE CHEST 1 VIEW COMPARISON:  CT 12/23/2015.  Chest x-ray 12/23/2015. FINDINGS: Mediastinum hilar structures are stable. Stable cardiomegaly. Stable right lower lobe atelectasis and/or consolidation and right pleural effusion. Left lung is clear. No pneumothorax. IMPRESSION: 1. Stable right lower lobe atelectasis and/or consolidation and right pleural effusion. 2. Stable cardiomegaly. Electronically Signed   By: Maisie Fushomas  Register   On: 12/24/2015 07:11     STUDIES:  5/19 Echo >> EF 55 to 60%, severe LVH  CULTURES: 5/18 Blood >> Pneumococcus 5/22 Blood >> strep pneumoniae >> sens rocephin   ANTIBIOTICS: 5/18 Zithromax >> 5/18 5/18 Rocephin >>  SIGNIFICANT EVENTS: 5/18  Admit 5/21  Bedside US >> not enough fluid to tap 5/24  Thoracentesis >> 1.4L removed  LINES/TUBES:   DISCUSSION: 82 y/o female with pneumococcal PNA with strep pneumoniae bacteremia and acute hypoxic respiratory failure.  She also has A fib with RVR, NSTEMI from demand ischemia.  She has hx of Stage III CKD s/p renal transplant, DM, chronic diastolic CHF, HLD  ASSESSMENT / PLAN:  PULMONARY A: Acute hypoxic respiratory failure 2nd to CAP. Rt pleural effusion - s/p thora on 5/24 with 1460 removed, clear yellow.  Likely transudate.  ATX RLL P:   US thoracentesis completed 5/24 Follow pleural studies, cytology  Continue pulmonary hygiene - chest PT / percussion, mucomyst, mobilize as tolerated, IS Trend PCXR  CARDIOVASCULAR A:  A fib. NSTEMI. Chronic diastolic CHF, HLD. P:  Per cardiology Rate control   RENAL A:   CKD 3 s/p renal transplant. Hyponatremia. P:   Consider diuresis as renal function / BP permit.  Defer to Renal  Trend BMP / UOP  Replace electrolytes as indicated   HEMATOLOGIC A:   Leukocytosis DVT prophylaxis  NSTEMI P:  F/u CBC Hep drip gtt per pharmacy.  OK to resume heparin gtt once CXR  cleared post thoracentesis.    INFECTIOUS A:   Pneumococcal PNA with bacteremia. P:   Rocephin, complete 10 days total If fevers, would CT chest  [facility]    Canary BrimBrandi Ollis, NP-C [facility] [name]: [phone] or if no answer [phone] 12/24/2015, 1:28 PM  STAFF NOTE: I, Rory Percyaniel Feinstein, MD FACP have personally reviewed patient's available data, including medical history, events of note, physical examination and test results as part of my evaluation. I have discussed with resident/NP and other care providers such as pharmacist, RN and RRT. In addition, I personally evaluated patient and elicited key findings of: she has increased her RR now, mild labor bleeding, pcxr worsening effusion and atx, plan thoracentesis both diagnostic and there, will send for cytology as well, I doubt thayt the effusion is the oNLY cause of the ATX compressive, if remains with ATX / collapse post tap then may need bronch assessment, also depends on the pleural fluid analysis, maintain neg balance pe renal as able, some crt rise note, UPDATE, thora 1.450 cc done clear yellow, will analyze,  repeat pcxr, updated pt in full, continued mucomysts x 2 more doses, IS, chest pt  Mcarthur Rossetti. Tyson Alias, MD, FACP [name]: [phone] [facility] 12/24/2015 2:10 PM

## 2015-12-24 NOTE — Progress Notes (Signed)
Patient ID: [name], female   DOB: 1931/11/12, 82 y.o.   MRN: [id number]  [facility] Progress Note   Assessment/ Plan:   1. AKI on CKD3T: Status post renal transplant in 2003 maintained on Tac/MMF with baseline creatinine between 1.0-1.4. Current acute on chronic renal failure suspected to be hemodynamically mediated from A. fib/RVR versus normotensive ATN with associated community acquired pneumonia. With intermittent elevation of Prograf levels (likely from interaction with diltiazem) necessitating decreased Prograf dose. Renal function slightly better versus unchanged over the past 48 hours (creatinine 2.1 to 1.9). Urine output appears to be inaccurate. Continue to monitor Prograf levels 2. A. fib with RVR: She appears to have converted back to normal sinus rhythm and now from intravenous to oral diltiazem. Diltiazem held this morning because of bradycardia and borderline hypotension. 3. NSTEMI with history of coronary artery disease: This is suspected to have been from demand ischemia but with history of CAD-needs further inquiry. Cardiology favoring Lexiscan over cardiac catheterization to avoid intravenous iodinated contrast exposure. Will ultimately require NOAC.  4. Community acquired pneumonia/right pleural effusion: Optimizing diuretic therapy and on intravenous ceftriaxone. To undergo thoracentesis today (was not done yesterday). 5. 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  Subjective:   She endorses some shortness of breath and some discomfort on the right side of her chest-awaiting thoracentesis today (postponed from yesterday)-appears will be done at bedside.    Objective:   BP 129/54 mmHg  Pulse 54  Temp(Src) 97.6 F (36.4 C) (Oral)  Resp 18  Ht 5\' 3"  (1.6 m)  Wt 75.841 kg (167 lb 3.2 oz)  BMI 29.63 kg/m2  SpO2 96%  Intake/Output Summary (Last 24 hours) at 12/24/15 1042 Last data filed at 12/24/15 1011  Gross per 24 hour   Intake 1090.31 ml  Output    600 ml  Net 490.31 ml   Weight change: -6.169 kg (-13 lb 9.6 oz)  Physical Exam: ION:[id number]:Appears somewhat uncomfortable -eating breakfast CVS: Pulse regular bradycardia, S1 and S2 normal Resp: Decreased breath sounds right base with fine rales left base Abd: Soft, flat, nontender Ext: Trace to 1+ lower extremity edema  Imaging: Ct Chest Wo Contrast  12/23/2015  CLINICAL DATA:  Shortness of breath. Pleural effusion. Congestive heart failure and diabetes. EXAM: CT CHEST WITHOUT CONTRAST TECHNIQUE: Multidetector CT imaging of the chest was performed following the standard protocol without IV contrast. COMPARISON:  Chest radiograph dated 12/23/2015 FINDINGS: Mediastinum/Lymph Nodes: No masses or pathologically enlarged lymph nodes identified on this un-enhanced exam. The heart is enlarged. There is a small pericardial effusion. Atherosclerotic disease of the coronary arteries and thoracic aorta is noted. Layering material fills the esophagus dependently. Lungs/Pleura: There is a moderate to large water density right pleural effusion. There is right lower lobe airspace consolidation versus lobar atelectasis. There is patchy airspace consolidation in the basilar segments of the left lower lobe. Upper abdomen: No acute findings. Musculoskeletal: No chest wall mass or suspicious bone lesions identified. IMPRESSION: Large right pleural effusion with right lower lobe collapse. Patchy airspace disease within the dependent segments of the left lower lobe. Heterogeneous material filling the distal 2/3 of the esophagus. These findings raise the possibility of aspiration pneumonia. Endobronchial lesion causing collapse of the right lower lobe is also in the differential diagnosis, although one is not visualized on this nonenhanced exam. Alternatively the large right pleural effusion may be causing secondary compressive atelectasis of the right lower lobe. Enlarged cardiac silhouette.  Atherosclerotic disease of the coronary  arteries and aorta. Electronically Signed   By: Ted Mcalpine [name]   On: 12/23/2015 14:41   Dg Chest Port 1 View  12/24/2015  CLINICAL DATA:  Shortness of breath.  Pleural effusion . EXAM: PORTABLE CHEST 1 VIEW COMPARISON:  CT 12/23/2015.  Chest x-ray 12/23/2015. FINDINGS: Mediastinum hilar structures are stable. Stable cardiomegaly. Stable right lower lobe atelectasis and/or consolidation and right pleural effusion. Left lung is clear. No pneumothorax. IMPRESSION: 1. Stable right lower lobe atelectasis and/or consolidation and right pleural effusion. 2. Stable cardiomegaly. Electronically Signed   By: Maisie Fus  Register   On: 12/24/2015 07:11   Dg Chest Port 1 View  12/23/2015  CLINICAL DATA:  Pneumonia. EXAM: PORTABLE CHEST 1 VIEW COMPARISON:  12/22/2015. FINDINGS: Stable cardiomegaly. Progressive infiltrate right lower lobe. Progressive right pleural effusion. Mild left base subsegmental atelectasis and or infiltrate. Left hemidiaphragm not imaged. No pneumothorax. IMPRESSION: 1. Progressive right lower lobe infiltrate and right pleural effusion. 2.  Left base subsegmental atelectasis and or mild infiltrate. 3.  Cardiomegaly.  No pulmonary venous congestion . Electronically Signed   By: Maisie Fus  Register   On: 12/23/2015 07:18    Labs: BMET  Recent Labs Lab 12/18/15 0830 12/19/15 0242 12/20/15 0800 12/21/15 0252 12/22/15 0243 12/23/15 0241 12/24/15 0320  NA 134* 134* 131* 129* 128* 131* 129*  K 3.9 3.8 3.7 3.9 3.9 3.6 3.8  CL 98* 96* 96* 95* 94* 98* 97*  CO2 20* 24 22 17* 19* 20* 19*  GLUCOSE 166* 132* 166* 147* 137* 119* 126*  BUN 21* 28* 36* 47* 59* 70* 69*  CREATININE 1.35* 1.51* 1.54* 2.05* 2.32* 1.29* 1.96*  CALCIUM 10.0 10.0 9.9 9.0 8.9 9.2 9.6   CBC  Recent Labs Lab 12/18/15 0830  12/21/15 0252 12/22/15 0243 12/23/15 0241 12/24/15 0320  WBC 22.0*  < > 14.6* 13.2* 13.4* 12.2*  NEUTROABS 19.2*  --   --   --   --   --   HGB  12.7  < > 11.5* 11.6* 11.4* 11.2*  HCT 39.4  < > 36.2 35.1* 35.0* 34.4*  MCV 87.8  < > 88.7 85.8 86.4 86.4  PLT 190  < > 280 309 280 305  < > = values in this interval not displayed.  Medications:    . acetylcysteine  4 mL Nebulization BID  . albuterol  2.5 mg Nebulization BID  . allopurinol  100 mg Oral QHS  . antiseptic oral rinse  7 mL Mouth Rinse BID  . aspirin EC  81 mg Oral Daily  . atorvastatin  10 mg Oral q1800  . cefTRIAXone (ROCEPHIN)  IV  2 g Intravenous Q24H  . cinacalcet  30 mg Oral Q supper  . diltiazem  240 mg Oral Daily  . famotidine  20 mg Oral BID  . guaiFENesin  1,200 mg Oral BID  . insulin aspart  0-15 Units Subcutaneous TID WC  . metoprolol  100 mg Oral BID  . mycophenolate  500 mg Oral BID  . sodium chloride flush  3 mL Intravenous Q12H  . tacrolimus  1.5 mg Oral QHS  . tacrolimus  1.5 mg Oral q morning - 10a   Zetta Bills, MD 12/24/2015, 10:42 AM

## 2015-12-25 ENCOUNTER — Inpatient Hospital Stay (HOSPITAL_COMMUNITY): Payer: Medicare Other

## 2015-12-25 DIAGNOSIS — I10 Essential (primary) hypertension: Secondary | ICD-10-CM

## 2015-12-25 DIAGNOSIS — I5032 Chronic diastolic (congestive) heart failure: Secondary | ICD-10-CM

## 2015-12-25 DIAGNOSIS — R000 Tachycardia, unspecified: Secondary | ICD-10-CM

## 2015-12-25 LAB — COMPREHENSIVE METABOLIC PANEL
ALBUMIN: 2.6 g/dL — AB (ref 3.5–5.0)
ALK PHOS: 86 U/L (ref 38–126)
ALT: 9 U/L — AB (ref 14–54)
AST: 16 U/L (ref 15–41)
Anion gap: 10 (ref 5–15)
BILIRUBIN TOTAL: 0.4 mg/dL (ref 0.3–1.2)
BUN: 61 mg/dL — AB (ref 6–20)
CALCIUM: 9.9 mg/dL (ref 8.9–10.3)
CO2: 23 mmol/L (ref 22–32)
CREATININE: 1.72 mg/dL — AB (ref 0.44–1.00)
Chloride: 101 mmol/L (ref 101–111)
GFR calc Af Amer: 31 mL/min — ABNORMAL LOW (ref >60)
GFR calc non Af Amer: 27 mL/min — ABNORMAL LOW (ref >60)
GLUCOSE: 110 mg/dL — AB (ref 65–99)
Potassium: 4.4 mmol/L (ref 3.5–5.1)
SODIUM: 134 mmol/L — AB (ref 135–145)
TOTAL PROTEIN: 5.9 g/dL — AB (ref 6.5–8.1)

## 2015-12-25 LAB — CBC
HEMATOCRIT: 35.8 % — AB (ref 36.0–46.0)
HEMOGLOBIN: 11.3 g/dL — AB (ref 12.0–15.0)
MCH: 27.6 pg (ref 26.0–34.0)
MCHC: 31.6 g/dL (ref 30.0–36.0)
MCV: 87.5 fL (ref 78.0–100.0)
Platelets: 306 10*3/uL (ref 150–400)
RBC: 4.09 MIL/uL (ref 3.87–5.11)
RDW: 14 % (ref 11.5–15.5)
WBC: 8.3 10*3/uL (ref 4.0–10.5)

## 2015-12-25 LAB — GLUCOSE, CAPILLARY
GLUCOSE-CAPILLARY: 172 mg/dL — AB (ref 65–99)
GLUCOSE-CAPILLARY: 118 mg/dL — AB (ref 65–99)
GLUCOSE-CAPILLARY: 143 mg/dL — AB (ref 65–99)
Glucose-Capillary: 109 mg/dL — ABNORMAL HIGH (ref 65–99)

## 2015-12-25 LAB — TACROLIMUS LEVEL: TACROLIMUS (FK506) - LABCORP: 11.4 ng/mL (ref 2.0–20.0)

## 2015-12-25 LAB — HEPARIN LEVEL (UNFRACTIONATED): HEPARIN UNFRACTIONATED: 0.46 [IU]/mL (ref 0.30–0.70)

## 2015-12-25 LAB — GRAM STAIN

## 2015-12-25 MED ORDER — FUROSEMIDE 10 MG/ML IJ SOLN
40.0000 mg | Freq: Once | INTRAMUSCULAR | Status: AC
Start: 2015-12-25 — End: 2015-12-25
  Administered 2015-12-25: 40 mg via INTRAVENOUS
  Filled 2015-12-25: qty 4

## 2015-12-25 MED ORDER — FUROSEMIDE 80 MG PO TABS
80.0000 mg | ORAL_TABLET | Freq: Every day | ORAL | Status: DC
  Administered 2015-12-26 – 2015-12-30 (×5): 80 mg via ORAL
  Filled 2015-12-25 (×5): qty 1

## 2015-12-25 MED ORDER — FAMOTIDINE 20 MG PO TABS
20.0000 mg | ORAL_TABLET | Freq: Every day | ORAL | Status: DC
  Administered 2015-12-26 – 2016-01-01 (×7): 20 mg via ORAL
  Filled 2015-12-25 (×7): qty 1

## 2015-12-25 NOTE — Progress Notes (Signed)
PULMONARY / CRITICAL CARE MEDICINE   Name: [name] MRN: [id number] DOB: 04/03/1932    ADMISSION DATE:  12/18/2015 CONSULTATION DATE:  12/20/2015  REFERRING MD:  Dr. Anne Fu  CHIEF COMPLAINT:  Short of breath  SUBJECTIVE:  Pt reports she feels much better.  Breathing easier, rested overnight.     VITAL SIGNS: BP 135/58 mmHg  Pulse 57  Temp(Src) 97.6 F (36.4 C) (Oral)  Resp 18  Ht  (1.6 m)  Wt 164 lb (74.39 kg)  BMI 29.06 kg/m2  SpO2 99%  INTAKE / OUTPUT: I/O last 3 completed shifts: In: 1519.9 [P.O.:835; I.V.:584.9; IV Piggyback:100] Out: 1350 [Urine:1350]  PHYSICAL EXAMINATION: General:  Elderly female in NAD, alert Neuro:  Normal strength, AAOx4 HEENT:  no stridor, jvd Cardiovascular: s1 s2 RR irregular  Lungs:  Improved breath sounds on R, left clear, improved cough Abdomen:  Soft, non tender Musculoskeletal:  No edema Skin:  No rashes or lesions  LABS:  BMET  Recent Labs Lab 12/23/15 0241 12/24/15 0320 12/25/15 0534  NA 131* 129* 134*  K 3.6 3.8 4.4  CL 98* 97* 101  CO2 20* 19* 23  BUN 70* 69* 61*  CREATININE 1.29* 1.96* 1.72*  GLUCOSE 119* 126* 110*    Electrolytes  Recent Labs Lab 12/18/15 1330  12/23/15 0241 12/24/15 0320 12/25/15 0534  CALCIUM  --   < > 9.2 9.6 9.9  MG 2.0  --   --   --   --   < > = values in this interval not displayed.  CBC  Recent Labs Lab 12/23/15 0241 12/24/15 0320 12/25/15 0534  WBC 13.4* 12.2* 8.3  HGB 11.4* 11.2* 11.3*  HCT 35.0* 34.4* 35.8*  PLT 280 305 306    Coag's  Recent Labs Lab 12/23/15 1607  INR 1.36    Sepsis Markers  Recent Labs Lab 12/18/15 1055  LATICACIDVEN 1.48    ABG  Recent Labs Lab 12/20/15 1109  PHART 7.474*  PCO2ART 25.3*  PO2ART 69.1*    Liver Enzymes  Recent Labs Lab 12/23/15 0241 12/25/15 0534  AST 18 16  ALT 11* 9*  ALKPHOS 89 86  BILITOT 0.4 0.4  ALBUMIN 2.8* 2.6*    Cardiac Enzymes  Recent Labs Lab 12/18/15 1330 12/18/15 1822  12/19/15 0124  TROPONINI 1.04* 1.26* 0.93*    Glucose  Recent Labs Lab 12/23/15 1656 12/24/15 0543 12/24/15 1134 12/24/15 1646 12/24/15 2259 12/25/15 0559  GLUCAP 111* 118* 188* 112* 129* 109*    Imaging Dg Chest Port 1 View  12/25/2015  CLINICAL DATA:  Pleural effusion.  Thoracentesis. EXAM: PORTABLE CHEST 1 VIEW COMPARISON:  12/24/2015.  CT 12/23/2015. FINDINGS: Mediastinum and hilar structures are stable. Stable cardiomegaly. Continued re-expansion of right base atelectasis. Right base infiltrate present. Small right pleural effusion. No pneumothorax. IMPRESSION: 1. Continued re-expansion of right base atelectasis. Right base infiltrates present. Small right pleural effusion. 2. Stable cardiomegaly.  No pulmonary venous congestion . Electronically Signed   By: Maisie Fus  Register   On: 12/25/2015 07:42   Dg Chest Port 1 View  12/24/2015  CLINICAL DATA:  Status post right thoracentesis EXAM: PORTABLE CHEST 1 VIEW COMPARISON:  Earlier today FINDINGS: Decreased right pleural effusion, trace to small currently. No pneumothorax or re-expansion edema. Patchy opacity in the right base along its is presumably incomplete resolution of atelectasis given findings of right middle and lower lobe collapse on chest CT 12/23/2015. The left lung is clear. Stable aortic contours. IMPRESSION: 1. No acute finding after  right thoracentesis. There is minimal residual fluid. 2. Partial re-expansion of the collapsed right middle and lower lobe seen on CT yesterday. Electronically Signed   By: Marnee SpringJonathon  Watts [name]   On: 12/24/2015 13:59     STUDIES:  5/19 Echo >> EF 55 to 60%, severe LVH  CULTURES: 5/18 Blood >> Pneumococcus 5/22 Blood >> strep pneumoniae >> sens rocephin  5/24 Pleural Fluid   Protein ratio >> 0.5  LDH ratio >> 0.8   Culture >>   Cytology >>  ANTIBIOTICS: 5/18 Zithromax >> 5/18 5/18 Rocephin >>  SIGNIFICANT EVENTS: 5/18  Admit 5/21  Bedside US >> not enough fluid to tap 5/24   Thoracentesis >> 1.4L removed    DISCUSSION: 82 y/o female with pneumococcal PNA with strep pneumoniae bacteremia and acute hypoxic respiratory failure.  She also has A fib with RVR, NSTEMI from demand ischemia.  She has hx of Stage III CKD s/p renal transplant, DM, chronic diastolic CHF, HLD  ASSESSMENT / PLAN:  PULMONARY A: Acute hypoxic respiratory failure 2nd to CAP. Large R pleural effusion - s/p thora on 5/24 with 1460 removed, clear yellow.  Exudate by LDH.  Suspect parapneumonic. ATX RLL P:   Follow pleural studies, cytology as above Continue pulmonary hygiene - chest PT / percussion, mucomyst, mobilize as tolerated, IS Trend PCXR  CARDIOVASCULAR A:  A fib. NSTEMI. Chronic diastolic CHF, HLD. P:  Per cardiology Rate control   RENAL A:   CKD 3 s/p renal transplant. Hyponatremia. P:   Defer volume status to renal Trend BMP / UOP  Replace electrolytes as indicated   HEMATOLOGIC A:   Leukocytosis DVT prophylaxis  NSTEMI P:  F/u CBC Hep drip gtt per pharmacy.  OK to resume heparin gtt once CXR cleared post thoracentesis.    INFECTIOUS A:   Pneumococcal PNA with bacteremia. P:   Rocephin, complete 10 days total Cultures as above   PCCM will continue to follow. Await culture / cytology from pleural studies.   Canary BrimBrandi Ollis, NP-C [place] [facility] [name]: [phone] or if no answer [phone] 12/25/2015, 8:30 AM   STAFF NOTE: I, Rory Percyaniel Flor Houdeshell, MD FACP have personally reviewed patient's available data, including medical history, events of note, physical examination and test results as part of my evaluation. I have discussed with resident/NP and other care providers such as pharmacist, RN and RRT. In addition, I personally evaluated patient and elicited key findings of: improved BS rt base, no distress, improved resp status, pcxr improved and resolving ATX post removal 1.5 liters, exudative by LDH, await cytology to follow, will remove stitch  back in am, follow clinically rt base atx, no role bronch at this stage as resolving atx on pcxr, complete course abx, effusion likely parapneumonic from PNA in setting renal failure overload, dc chest pt, allow mucomysts to dc, continued IS, pulm flutter, ambualtion  Mcarthur Rossettianiel J. Tyson AliasFeinstein, MD, FACP [name]: [phone] [place] 12/25/2015 10:33 AM

## 2015-12-25 NOTE — Progress Notes (Signed)
Patient ID: [name], female   DOB: September 02, 1931, 82 y.o.   MRN: [id number]  [facility] Progress Note   Assessment/ Plan:   1. AKI on CKD3T: Status post renal transplant in 2003 maintained on Tac/MMF with baseline creatinine between 1.0-1.4. Current acute on chronic renal failure suspected to be hemodynamically mediated from A. fib/RVR versus normotensive ATN with associated community acquired pneumonia. Renal function continues to show sluggish improvement with fair urine output that may be incompletely charted. No acute to light abnormalities to prompt intervention. Prograf dose adjusted based on higher levels due to interaction with diltiazem.  2. A. fib with RVR: She appears to have converted back to normal sinus rhythm and now from intravenous to oral diltiazem. Will likely need reevaluation of antihypertensive therapy given borderline low blood pressures 3. NSTEMI with history of coronary artery disease: This is suspected to have been from demand ischemia but with history of CAD-needs further inquiry. Further workup as directed by cardiology with the goal to try and limit intravenous iodinated contrast exposure.  4. Community acquired pneumonia/right pleural effusion: Optimizing diuretic therapy and on intravenous ceftriaxone. Status post thoracentesis yesterday with yielding about 1500 mL of fluid that appears to be transudative. 5. 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  Subjective:   Reports to be feeling better today-feels that thoracentesis helped significantly. Still feeling weak and requiring physical therapy. She reports that [birth year] is [birth year].    Objective:   BP 107/92 mmHg  Pulse 84  Temp(Src) 97.6 F (36.4 C) (Oral)  Resp 18  Ht 5\' 3"  (1.6 m)  Wt 74.39 kg (164 lb)  BMI 29.06 kg/m2  SpO2 100%  Intake/Output Summary (Last 24 hours) at 12/25/15 1104 Last data filed at 12/25/15 0610  Gross per 24 hour  Intake 763.54  ml  Output   1050 ml  Net -286.46 ml   Weight change: -1.315 kg (-2 lb 14.4 oz)  Physical Exam: Gen: Comfortably resting in bed, daughter at bedside  CVS: Pulse regular bradycardia, S1 and S2 normal Resp: clear breath sounds bilaterally-no distant rales  Abd: Soft, flat, nontender Ext: Trace to 1+ lower extremity edema  Imaging: Ct Chest Wo Contrast  12/23/2015  CLINICAL DATA:  Shortness of breath. Pleural effusion. Congestive heart failure and diabetes. EXAM: CT CHEST WITHOUT CONTRAST TECHNIQUE: Multidetector CT imaging of the chest was performed following the standard protocol without IV contrast. COMPARISON:  Chest radiograph dated 12/23/2015 FINDINGS: Mediastinum/Lymph Nodes: No masses or pathologically enlarged lymph nodes identified on this un-enhanced exam. The heart is enlarged. There is a small pericardial effusion. Atherosclerotic disease of the coronary arteries and thoracic aorta is noted. Layering material fills the esophagus dependently. Lungs/Pleura: There is a moderate to large water density right pleural effusion. There is right lower lobe airspace consolidation versus lobar atelectasis. There is patchy airspace consolidation in the basilar segments of the left lower lobe. Upper abdomen: No acute findings. Musculoskeletal: No chest wall mass or suspicious bone lesions identified. IMPRESSION: Large right pleural effusion with right lower lobe collapse. Patchy airspace disease within the dependent segments of the left lower lobe. Heterogeneous material filling the distal 2/3 of the esophagus. These findings raise the possibility of aspiration pneumonia. Endobronchial lesion causing collapse of the right lower lobe is also in the differential diagnosis, although one is not visualized on this nonenhanced exam. Alternatively the large right pleural effusion may be causing secondary compressive atelectasis of the right lower lobe. Enlarged cardiac silhouette. Atherosclerotic disease of  the  coronary arteries and aorta. Electronically Signed   By: Ted Mcalpine [name]   On: 12/23/2015 14:41   Dg Chest Port 1 View  12/25/2015  CLINICAL DATA:  Pleural effusion.  Thoracentesis. EXAM: PORTABLE CHEST 1 VIEW COMPARISON:  12/24/2015.  CT 12/23/2015. FINDINGS: Mediastinum and hilar structures are stable. Stable cardiomegaly. Continued re-expansion of right base atelectasis. Right base infiltrate present. Small right pleural effusion. No pneumothorax. IMPRESSION: 1. Continued re-expansion of right base atelectasis. Right base infiltrates present. Small right pleural effusion. 2. Stable cardiomegaly.  No pulmonary venous congestion . Electronically Signed   By: Maisie Fus  Register   On: 12/25/2015 07:42   Dg Chest Port 1 View  12/24/2015  CLINICAL DATA:  Status post right thoracentesis EXAM: PORTABLE CHEST 1 VIEW COMPARISON:  Earlier today FINDINGS: Decreased right pleural effusion, trace to small currently. No pneumothorax or re-expansion edema. Patchy opacity in the right base along its is presumably incomplete resolution of atelectasis given findings of right middle and lower lobe collapse on chest CT 12/23/2015. The left lung is clear. Stable aortic contours. IMPRESSION: 1. No acute finding after right thoracentesis. There is minimal residual fluid. 2. Partial re-expansion of the collapsed right middle and lower lobe seen on CT yesterday. Electronically Signed   By: Marnee Spring [name]   On: 12/24/2015 13:59   Dg Chest Port 1 View  12/24/2015  CLINICAL DATA:  Shortness of breath.  Pleural effusion . EXAM: PORTABLE CHEST 1 VIEW COMPARISON:  CT 12/23/2015.  Chest x-ray 12/23/2015. FINDINGS: Mediastinum hilar structures are stable. Stable cardiomegaly. Stable right lower lobe atelectasis and/or consolidation and right pleural effusion. Left lung is clear. No pneumothorax. IMPRESSION: 1. Stable right lower lobe atelectasis and/or consolidation and right pleural effusion. 2. Stable cardiomegaly.  Electronically Signed   By: Maisie Fus  Register   On: 12/24/2015 07:11    Labs: BMET  Recent Labs Lab 12/19/15 0242 12/20/15 0800 12/21/15 0252 12/22/15 0243 12/23/15 0241 12/24/15 0320 12/25/15 0534  NA 134* 131* 129* 128* 131* 129* 134*  K 3.8 3.7 3.9 3.9 3.6 3.8 4.4  CL 96* 96* 95* 94* 98* 97* 101  CO2 24 22 17* 19* 20* 19* 23  GLUCOSE 132* 166* 147* 137* 119* 126* 110*  BUN 28* 36* 47* 59* 70* 69* 61*  CREATININE 1.51* 1.54* 2.05* 2.32* 1.29* 1.96* 1.72*  CALCIUM 10.0 9.9 9.0 8.9 9.2 9.6 9.9   CBC  Recent Labs Lab 12/22/15 0243 12/23/15 0241 12/24/15 0320 12/25/15 0534  WBC 13.2* 13.4* 12.2* 8.3  HGB 11.6* 11.4* 11.2* 11.3*  HCT 35.1* 35.0* 34.4* 35.8*  MCV 85.8 86.4 86.4 87.5  PLT 309 280 305 306    Medications:    . albuterol  2.5 mg Nebulization BID  . allopurinol  100 mg Oral QHS  . antiseptic oral rinse  7 mL Mouth Rinse BID  . aspirin EC  81 mg Oral Daily  . atorvastatin  10 mg Oral q1800  . cefTRIAXone (ROCEPHIN)  IV  2 g Intravenous Q24H  . cinacalcet  30 mg Oral Q supper  . diltiazem  240 mg Oral Daily  . famotidine  20 mg Oral BID  . guaiFENesin  1,200 mg Oral BID  . insulin aspart  0-15 Units Subcutaneous TID WC  . metoprolol  100 mg Oral BID  . mycophenolate  500 mg Oral BID  . sodium chloride flush  3 mL Intravenous Q12H  . tacrolimus  1.5 mg Oral QHS  . tacrolimus  1.5 mg  Oral q morning - 10a   Zetta Bills, MD 12/25/2015, 11:04 AM

## 2015-12-25 NOTE — Progress Notes (Signed)
PROGRESS NOTE                                                                                                                                                                                                             Patient Demographics:    [name], is a 82 y.o. female, DOB - [birth year], MVH:[id number]  Admit date - 12/18/2015   Admitting Physician Kathleene Hazel, MD  Outpatient Primary MD for the patient is No primary care provider on file.  LOS - 7  Outpatient Specialists: Nephrology cardiology  Chief Complaint  Patient presents with  . Shortness of Breath  . Chest Pain       Brief Narrative   82 y.o. female with a history of CAD, HTN, DM2, CKD s/p renal transplant in 2003, admitted on 5/18 after presenting with shortness of breath and chest pain, found to have A fib with RVR (CHADVASC 7), as well as NSTEMI from demand ischemia. Also had a pneumococcal pneumonia  Hospital course complicated due to development of acute hypoxic respiratory failure with pneumococcal pneumonia. [facility] PCCM involving care. Nephrology consult and given worsened renal function. Patient transferred to hospitalist service for further care.Patient status post bedside thoracentesis on 5/24   Subjective:   Shortness of breath significantly improved after thoracentesis, disappointed about not sending samples of pleural fluid down in a timely fashion   Assessment  & Plan :   Acute respiratory failure with hypoxia secondary to pneumococcal pneumonia and pleural effusion Worsening infiltrate and effusion on chest x-ray. Receiving empiric IV Rocephin (10 day course) started 5/18. Continue nebulizers, Mucinex and pulmonary toilet. Given worsened chest x-ray findings. PCCM  recommended CT scan that showed a large right pleural effusion with right lower lobe collapse and patchy airspace disease of the left lower lobe, finding suspicion for  aspiration pneumonia, endobronchial lesion causing collapse of the right lower lobe is also in the differential diagnosis s/p thora on 5/24 with 1460 removed, clear yellow. Likely exudative by LDH, repeat chest x-ray shows reexpansion of the right base, follow pleural fluid culture from 5/24, no growth from blood culture from 5/22    Acute on Chronic kidney disease stage III  Continue Prograf. Creatinine 2.32>1.29> 1.72 suspected to be hemodynamically mediated with atrial fibrillation/rapid ventricular response vs ATN Status post renal transplant in 2003 maintained on Tac/MMF with  baseline creatinine between 1.0-1.4. Continue to monitor Prograf levels  A. fib with RVR,CHA2DS2VASc = 7 Required Cardizem drip on admission. Now stable on oral Cardizem and metoprolol. 2-D echo showed normal EF. now in sinus rhythm. On IV heparin with plan on starting NOAC    NSTE MI Likely secondary to demand ischemia with rapid A. fib. On IV heparin. Plan on [facility] when cardiology recommends  Acute on Chronic Diastolic CHF: BNP was abnormal at 1200 on admit. . Renal function fluctuating. She was on 80 mg PO lasix as an outpatient.  Currently Lasix on hold, follow BMP closely.   hold ARB. Strict I/Os.    diabetes mellitus Stable. Continue sliding scale coverage. Hemoglobin A1c 6.9   physical deconditioning PT recommended home health   Code Status: full code Family Communication  :None at bedside Disposition Plan  : Pending further cardiology recommendations (stress test once respiratory function improved, PT evaluation)   Barriers For Discharge :  Pending cardiac workup  Consults  :  Cardiology PCCM Renal   Procedures  :  2-D echo  DVT Prophylaxis  :  IV heparin  Lab Results  Component Value Date   PLT 306 12/25/2015    Antibiotics  :   Rocephin 5/18- Azithromycin 5/18-5/19      Objective:   Filed Vitals:   12/24/15 2247 12/25/15 0500 12/25/15 0610 12/25/15 0815  BP:  124/49  135/58   Pulse: 56  57   Temp: 97.5 F (36.4 C)  97.6 F (36.4 C)   TempSrc: Oral  Oral   Resp: 18  18   Height:      Weight:  74.39 kg (164 lb)    SpO2: 96%  97% 99%    Wt Readings from Last 3 Encounters:  12/25/15 74.39 kg (164 lb)  08/17/14 70.126 kg (154 lb 9.6 oz)  03/20/14 75.501 kg (166 lb 7.2 oz)     Intake/Output Summary (Last 24 hours) at 12/25/15 0821 Last data filed at 12/25/15 0610  Gross per 24 hour  Intake 938.54 ml  Output   1050 ml  Net -111.46 ml     Physical Exam  Gen: not in distress, Appears fatigued  HEENT: no pallor, moist mucosa, supple neck chest: diminished right-sided breath sounds CVS: N S1&S2, no murmurs, rubs or gallop GI: soft, NT, ND, BS+ Musculoskeletal: warm, no edema CNS: AAOX3, non focal    Data Review:    CBC  Recent Labs Lab 12/18/15 0830  12/21/15 0252 12/22/15 0243 12/23/15 0241 12/24/15 0320 12/25/15 0534  WBC 22.0*  < > 14.6* 13.2* 13.4* 12.2* 8.3  HGB 12.7  < > 11.5* 11.6* 11.4* 11.2* 11.3*  HCT 39.4  < > 36.2 35.1* 35.0* 34.4* 35.8*  PLT 190  < > 280 309 280 305 306  MCV 87.8  < > 88.7 85.8 86.4 86.4 87.5  MCH 28.3  < > 28.2 28.4 28.1 28.1 27.6  MCHC 32.2  < > 31.8 33.0 32.6 32.6 31.6  RDW 13.6  < > 13.8 13.8 13.9 13.8 14.0  LYMPHSABS 1.1  --   --   --   --   --   --   MONOABS 1.7*  --   --   --   --   --   --   EOSABS 0.0  --   --   --   --   --   --   BASOSABS 0.0  --   --   --   --   --   --   < > =  values in this interval not displayed.  Chemistries   Recent Labs Lab 12/18/15 0830 12/18/15 1330  12/21/15 0252 12/22/15 0243 12/23/15 0241 12/24/15 0320 12/25/15 0534  NA 134*  --   < > 129* 128* 131* 129* 134*  K 3.9  --   < > 3.9 3.9 3.6 3.8 4.4  CL 98*  --   < > 95* 94* 98* 97* 101  CO2 20*  --   < > 17* 19* 20* 19* 23  GLUCOSE 166*  --   < > 147* 137* 119* 126* 110*  BUN 21*  --   < > 47* 59* 70* 69* 61*  CREATININE 1.35*  --   < > 2.05* 2.32* 1.29* 1.96* 1.72*  CALCIUM 10.0  --    < > 9.0 8.9 9.2 9.6 9.9  MG  --  2.0  --   --   --   --   --   --   AST 22  --   --   --   --  18  --  16  ALT 10*  --   --   --   --  11*  --  9*  ALKPHOS 99  --   --   --   --  89  --  86  BILITOT 2.0*  --   --   --   --  0.4  --  0.4  < > = values in this interval not displayed. ------------------------------------------------------------------------------------------------------------------ No results for input(s): CHOL, HDL, LDLCALC, TRIG, CHOLHDL, LDLDIRECT in the last 72 hours.  Lab Results  Component Value Date   HGBA1C 6.9* 12/18/2015   ------------------------------------------------------------------------------------------------------------------ No results for input(s): TSH, T4TOTAL, T3FREE, THYROIDAB in the last 72 hours.  Invalid input(s): FREET3 ------------------------------------------------------------------------------------------------------------------ No results for input(s): VITAMINB12, FOLATE, FERRITIN, TIBC, IRON, RETICCTPCT in the last 72 hours.  Coagulation profile  Recent Labs Lab 12/23/15 1607  INR 1.36    No results for input(s): DDIMER in the last 72 hours.  Cardiac Enzymes  Recent Labs Lab 12/18/15 1330 12/18/15 1822 12/19/15 0124  TROPONINI 1.04* 1.26* 0.93*   ------------------------------------------------------------------------------------------------------------------    Component Value Date/Time   BNP 1232.3* 12/18/2015 0830    Inpatient Medications  Scheduled Meds: . acetylcysteine  4 mL Nebulization BID  . albuterol  2.5 mg Nebulization BID  . allopurinol  100 mg Oral QHS  . antiseptic oral rinse  7 mL Mouth Rinse BID  . aspirin EC  81 mg Oral Daily  . atorvastatin  10 mg Oral q1800  . cefTRIAXone (ROCEPHIN)  IV  2 g Intravenous Q24H  . cinacalcet  30 mg Oral Q supper  . diltiazem  240 mg Oral Daily  . famotidine  20 mg Oral BID  . guaiFENesin  1,200 mg Oral BID  . insulin aspart  0-15 Units Subcutaneous TID WC    . metoprolol  100 mg Oral BID  . mycophenolate  500 mg Oral BID  . sodium chloride flush  3 mL Intravenous Q12H  . tacrolimus  1.5 mg Oral QHS  . tacrolimus  1.5 mg Oral q morning - 10a   Continuous Infusions: . sodium chloride 10 mL/hr (12/24/15 0732)  . heparin 1,250 Units/hr (12/24/15 1743)   PRN Meds:.sodium chloride, acetaminophen, albuterol, nitroGLYCERIN, ondansetron (ZOFRAN) IV, senna-docusate, sodium chloride flush, traMADol  Micro Results Recent Results (from the past 240 hour(s))  Culture, blood (Routine X 2) w Reflex to ID Panel     Status: Abnormal  Collection Time: 12/18/15 10:49 AM  Result Value Ref Range Status   Specimen Description BLOOD RIGHT HAND  Final   Special Requests BOTTLES DRAWN AEROBIC AND ANAEROBIC  5CC  Final   Culture  Setup Time   Final    GRAM POSITIVE COCCI IN CHAINS IN PAIRS IN BOTH AEROBIC AND ANAEROBIC BOTTLES Organism ID to follow CRITICAL RESULT CALLED TO, READ BACK BY AND VERIFIED WITH: CARON AMEND,PHARMD@0050  12/19/15 MKELLY    Culture STREPTOCOCCUS PNEUMONIAE (A)  Final   Report Status 12/21/2015 FINAL  Final   Organism ID, Bacteria STREPTOCOCCUS PNEUMONIAE  Final      Susceptibility   Streptococcus pneumoniae - MIC*    ERYTHROMYCIN 2 RESISTANT Resistant     LEVOFLOXACIN 0.5 SENSITIVE Sensitive     VANCOMYCIN 0.5 SENSITIVE Sensitive     PENICILLIN 0.25 SENSITIVE Sensitive     CEFTRIAXONE 0.25 SENSITIVE Sensitive     * STREPTOCOCCUS PNEUMONIAE  Blood Culture ID Panel (Reflexed)     Status: Abnormal   Collection Time: 12/18/15 10:49 AM  Result Value Ref Range Status   Enterococcus species NOT DETECTED NOT DETECTED Final   Vancomycin resistance NOT DETECTED NOT DETECTED Final   Listeria monocytogenes NOT DETECTED NOT DETECTED Final   Staphylococcus species NOT DETECTED NOT DETECTED Final   Staphylococcus aureus NOT DETECTED NOT DETECTED Final   Methicillin resistance NOT DETECTED NOT DETECTED Final   Streptococcus species NOT  DETECTED NOT DETECTED Final   Streptococcus agalactiae NOT DETECTED NOT DETECTED Final   Streptococcus pneumoniae DETECTED (A) NOT DETECTED Final    Comment: CRITICAL RESULT CALLED TO, READ BACK BY AND VERIFIED WITH: CARON AMEND,PHARMD @0050  12/19/15 MKELLY    Streptococcus pyogenes NOT DETECTED NOT DETECTED Final   Acinetobacter baumannii NOT DETECTED NOT DETECTED Final   Enterobacteriaceae species NOT DETECTED NOT DETECTED Final   Enterobacter cloacae complex NOT DETECTED NOT DETECTED Final   Escherichia coli NOT DETECTED NOT DETECTED Final   Klebsiella oxytoca NOT DETECTED NOT DETECTED Final   Klebsiella pneumoniae NOT DETECTED NOT DETECTED Final   Proteus species NOT DETECTED NOT DETECTED Final   Serratia marcescens NOT DETECTED NOT DETECTED Final   Carbapenem resistance NOT DETECTED NOT DETECTED Final   Haemophilus influenzae NOT DETECTED NOT DETECTED Final   Neisseria meningitidis NOT DETECTED NOT DETECTED Final   Pseudomonas aeruginosa NOT DETECTED NOT DETECTED Final   Candida albicans NOT DETECTED NOT DETECTED Final   Candida glabrata NOT DETECTED NOT DETECTED Final   Candida krusei NOT DETECTED NOT DETECTED Final   Candida parapsilosis NOT DETECTED NOT DETECTED Final   Candida tropicalis NOT DETECTED NOT DETECTED Final  Culture, blood (Routine X 2) w Reflex to ID Panel     Status: Abnormal   Collection Time: 12/18/15 10:59 AM  Result Value Ref Range Status   Specimen Description BLOOD LEFT ANTECUBITAL  Final   Special Requests BOTTLES DRAWN AEROBIC AND ANAEROBIC  5CC  Final   Culture  Setup Time   Final    GRAM POSITIVE COCCI IN PAIRS IN CHAINS IN BOTH AEROBIC AND ANAEROBIC BOTTLES CRITICAL RESULT CALLED TO, READ BACK BY AND VERIFIED WITH: CARON AMEND,PHARMD @0050  12/19/15 KELLYM    Culture (A)  Final    STREPTOCOCCUS PNEUMONIAE SUSCEPTIBILITIES PERFORMED ON PREVIOUS CULTURE WITHIN THE LAST 5 DAYS.    Report Status 12/21/2015 FINAL  Final  MRSA PCR Screening      Status: Abnormal   Collection Time: 12/18/15  9:20 PM  Result Value Ref Range Status  MRSA by PCR POSITIVE (A) NEGATIVE Final    Comment:        The GeneXpert MRSA Assay (FDA approved for NASAL specimens only), is one component of a comprehensive MRSA colonization surveillance program. It is not intended to diagnose MRSA infection nor to guide or monitor treatment for MRSA infections. RESULT CALLED TO, READ BACK BY AND VERIFIED WITH: S ROUSE,RN @0302  12/19/15 MKELLY   Culture, blood (Routine X 2) w Reflex to ID Panel     Status: None (Preliminary result)   Collection Time: 12/22/15 10:58 AM  Result Value Ref Range Status   Specimen Description BLOOD RIGHT HAND  Final   Special Requests IN PEDIATRIC BOTTLE  3CC  Final   Culture NO GROWTH 2 DAYS  Final   Report Status PENDING  Incomplete  Culture, blood (Routine X 2) w Reflex to ID Panel     Status: None (Preliminary result)   Collection Time: 12/22/15 11:00 AM  Result Value Ref Range Status   Specimen Description BLOOD RIGHT HAND  Final   Special Requests IN PEDIATRIC BOTTLE 3.0CC  Final   Culture NO GROWTH 2 DAYS  Final   Report Status PENDING  Incomplete  Gram stain     Status: None   Collection Time: 12/24/15  9:01 PM  Result Value Ref Range Status   Specimen Description PLEURAL  Final   Special Requests Immunocompromised  Final   Gram Stain   Final    CYTOSPIN SMEAR WBC PRESENT,BOTH PMN AND MONONUCLEAR NO ORGANISMS SEEN    Report Status 12/25/2015 FINAL  Final  Culture, body fluid-bottle     Status: None (Preliminary result)   Collection Time: 12/24/15  9:01 PM  Result Value Ref Range Status   Specimen Description PLEURAL  Final   Special Requests AEROBIC BOTTLE ONLY  Final   Culture PENDING  Incomplete   Report Status PENDING  Incomplete    Radiology Reports Ct Chest Wo Contrast  12/23/2015  CLINICAL DATA:  Shortness of breath. Pleural effusion. Congestive heart failure and diabetes. EXAM: CT CHEST WITHOUT  CONTRAST TECHNIQUE: Multidetector CT imaging of the chest was performed following the standard protocol without IV contrast. COMPARISON:  Chest radiograph dated 12/23/2015 FINDINGS: Mediastinum/Lymph Nodes: No masses or pathologically enlarged lymph nodes identified on this un-enhanced exam. The heart is enlarged. There is a small pericardial effusion. Atherosclerotic disease of the coronary arteries and thoracic aorta is noted. Layering material fills the esophagus dependently. Lungs/Pleura: There is a moderate to large water density right pleural effusion. There is right lower lobe airspace consolidation versus lobar atelectasis. There is patchy airspace consolidation in the basilar segments of the left lower lobe. Upper abdomen: No acute findings. Musculoskeletal: No chest wall mass or suspicious bone lesions identified. IMPRESSION: Large right pleural effusion with right lower lobe collapse. Patchy airspace disease within the dependent segments of the left lower lobe. Heterogeneous material filling the distal 2/3 of the esophagus. These findings raise the possibility of aspiration pneumonia. Endobronchial lesion causing collapse of the right lower lobe is also in the differential diagnosis, although one is not visualized on this nonenhanced exam. Alternatively the large right pleural effusion may be causing secondary compressive atelectasis of the right lower lobe. Enlarged cardiac silhouette. Atherosclerotic disease of the coronary arteries and aorta. Electronically Signed   By: Ted Mcalpine [name]   On: 12/23/2015 14:41   Dg Chest Port 1 View  12/25/2015  CLINICAL DATA:  Pleural effusion.  Thoracentesis. EXAM: PORTABLE CHEST 1 VIEW COMPARISON:  12/24/2015.  CT 12/23/2015. FINDINGS: Mediastinum and hilar structures are stable. Stable cardiomegaly. Continued re-expansion of right base atelectasis. Right base infiltrate present. Small right pleural effusion. No pneumothorax. IMPRESSION: 1. Continued  re-expansion of right base atelectasis. Right base infiltrates present. Small right pleural effusion. 2. Stable cardiomegaly.  No pulmonary venous congestion . Electronically Signed   By: Maisie Fus  Register   On: 12/25/2015 07:42   Dg Chest Port 1 View  12/24/2015  CLINICAL DATA:  Status post right thoracentesis EXAM: PORTABLE CHEST 1 VIEW COMPARISON:  Earlier today FINDINGS: Decreased right pleural effusion, trace to small currently. No pneumothorax or re-expansion edema. Patchy opacity in the right base along its is presumably incomplete resolution of atelectasis given findings of right middle and lower lobe collapse on chest CT 12/23/2015. The left lung is clear. Stable aortic contours. IMPRESSION: 1. No acute finding after right thoracentesis. There is minimal residual fluid. 2. Partial re-expansion of the collapsed right middle and lower lobe seen on CT yesterday. Electronically Signed   By: Marnee Spring [name]   On: 12/24/2015 13:59   Dg Chest Port 1 View  12/24/2015  CLINICAL DATA:  Shortness of breath.  Pleural effusion . EXAM: PORTABLE CHEST 1 VIEW COMPARISON:  CT 12/23/2015.  Chest x-ray 12/23/2015. FINDINGS: Mediastinum hilar structures are stable. Stable cardiomegaly. Stable right lower lobe atelectasis and/or consolidation and right pleural effusion. Left lung is clear. No pneumothorax. IMPRESSION: 1. Stable right lower lobe atelectasis and/or consolidation and right pleural effusion. 2. Stable cardiomegaly. Electronically Signed   By: Maisie Fus  Register   On: 12/24/2015 07:11   Dg Chest Port 1 View  12/23/2015  CLINICAL DATA:  Pneumonia. EXAM: PORTABLE CHEST 1 VIEW COMPARISON:  12/22/2015. FINDINGS: Stable cardiomegaly. Progressive infiltrate right lower lobe. Progressive right pleural effusion. Mild left base subsegmental atelectasis and or infiltrate. Left hemidiaphragm not imaged. No pneumothorax. IMPRESSION: 1. Progressive right lower lobe infiltrate and right pleural effusion. 2.  Left base  subsegmental atelectasis and or mild infiltrate. 3.  Cardiomegaly.  No pulmonary venous congestion . Electronically Signed   By: Maisie Fus  Register   On: 12/23/2015 07:18   Dg Chest Port 1 View  12/22/2015  CLINICAL DATA:  Continued assessment of pleural effusion. EXAM: PORTABLE CHEST 1 VIEW COMPARISON:  12/21/2015. FINDINGS: Cardiomegaly. Large RIGHT pleural effusion is increased. Compressive atelectasis at the RIGHT base. Trace LEFT effusion may be improved. Overall improved vascular congestion. IMPRESSION: Increasing RIGHT pleural effusion otherwise improved exam. Electronically Signed   By: Elsie Stain [name]   On: 12/22/2015 07:27   Dg Chest Port 1 View  12/21/2015  CLINICAL DATA:  Community-acquired pneumonia EXAM: PORTABLE CHEST 1 VIEW COMPARISON:  Chest radiograph from one day prior. FINDINGS: Stable cardiomediastinal silhouette with mild cardiomegaly. No pneumothorax. Moderate right and small left pleural effusions, increased bilaterally. Mild pulmonary edema, increased. Patchy bibasilar lung consolidation, right greater than left, slightly worsened bilaterally. IMPRESSION: 1. Patchy bibasilar lung consolidation, right greater than left, slightly worsened bilaterally, likely a combination of atelectasis and pneumonia. 2. Stable mild cardiomegaly with increased mild pulmonary edema. 3. Moderate right and small left pleural effusions, increased bilaterally. Electronically Signed   By: Delbert Phenix [name]   On: 12/21/2015 08:39   Dg Chest Port 1 View  12/20/2015  CLINICAL DATA:  Shortness of breath EXAM: PORTABLE CHEST 1 VIEW COMPARISON:  12/18/2015 FINDINGS: New right lower lung opacity, likely combination of atelectasis/ pneumonia and layering pleural effusion. Mild left basilar opacity, possibly atelectasis. Cardiomegaly.  No frank interstitial  edema. No pneumothorax. IMPRESSION: New right lower lung opacity, likely combination of atelectasis/ pneumonia and layering pleural effusion. Mild left basilar  opacity, possibly atelectasis. Electronically Signed   By: Charline Bills [name]   On: 12/20/2015 11:53   Dg Chest Port 1 View  12/18/2015  CLINICAL DATA:  Productive cough for 2 weeks, shortness of breath, headache EXAM: PORTABLE CHEST 1 VIEW COMPARISON:  08/11/2014 FINDINGS: Borderline cardiomegaly. No pulmonary edema. There is streaky left base retrocardiac atelectasis or early infiltrate. Atherosclerotic but for sclerotic calcifications of thoracic aorta. IMPRESSION: Streaky left base retrocardiac atelectasis or early infiltrate. No pulmonary edema. Follow-up to resolution recommended. Electronically Signed   By: Natasha Mead [name]   On: 12/18/2015 09:38    Time Spent in minutes  35   Anelise Staron M.D on 12/25/2015 at 8:21 AM  Between 7am to 7pm - Pager - [phone]  After 7pm go to [email or web address] - password [facility]  [facility] -  Office  [phone]

## 2015-12-25 NOTE — Progress Notes (Signed)
ANTICOAGULATION CONSULT NOTE - Follow Up Consult  Pharmacy Consult for Heparin Indication: elevated troponins, Afib  Allergies  Allergen Reactions  . Sulfa Drugs Cross Reactors Swelling    Patient Measurements: Height: 5\' 3"  (160 cm) Weight: 164 lb (74.39 kg) IBW/kg (Calculated) : 52.4 Heparin Dosing Weight: 68 kg  Vital Signs: Temp: 97.6 F (36.4 C) (05/25 0610) Temp Source: Oral (05/25 0610) BP: 107/92 mmHg (05/25 1051) Pulse Rate: 84 (05/25 1051)  Labs:  Recent Labs  12/23/15 0241  12/23/15 1607 12/23/15 2114 12/24/15 0320 12/25/15 0534  HGB 11.4*  --   --   --  11.2* 11.3*  HCT 35.0*  --   --   --  34.4* 35.8*  PLT 280  --   --   --  305 306  LABPROT  --   --  16.9*  --   --   --   INR  --   --  1.36  --   --   --   HEPARINUNFRC 0.81*  < >  --  0.67 0.43 0.46  CREATININE 1.29*  --   --   --  1.96* 1.72*  < > = values in this interval not displayed.  Estimated Creatinine Clearance: 24.8 mL/min (by C-G formula based on Cr of 1.72).  Assessment: 82 y.o. F initially started on heparin for r/o ACS, then continued for new onset Afib with CHA2DS2VASc = 7.   Heparin level therapeutic Planning conversion to NOAC once procedures complete   Goal of Therapy:  Heparin level 0.3-0.7 units/ml Monitor platelets by anticoagulation protocol: Yes   Plan:  Heparin at 1250 units / hr Follow up AM labs  Thank you Okey RegalLisa Makaylia Hewett, PharmD [phone]

## 2015-12-25 NOTE — Progress Notes (Signed)
Pt a/o, no c/o pain, pt remains on hep gtt @ 12.5 ml/hr, VSS, pt stable

## 2015-12-25 NOTE — Progress Notes (Signed)
Physical Therapy Treatment Patient Details Name: [name] MRN: [id number] DOB: 1932-06-07 Today's Date: 12/25/2015    History of Present Illness Joy [name] is a 82 y.o. female with a Past Medical History of FSGS status post transplant, CAD status post cardiac catheterization, HTN, cholelithiasis, CHF, gout, C KD, DM who presents with A. fib with RVR and evidence of demand ischemia.    PT Comments    Pt able to increase her ambulation distance slightly and very pleased.  Spoke with cardiac rehab, who plans on seeing pt tomorrow.  Pt is motivated to return to her PLOF.  Discussed d/c plans and pt thinks her daughter will be able to come stay with her.  Pt requires increased time and moves slowly.    Follow Up Recommendations  Home health PT;Supervision - Intermittent     Equipment Recommendations  None recommended by PT    Recommendations for Other Services       Precautions / Restrictions Precautions Precautions: Fall Precaution Comments: O2 dependent Restrictions Weight Bearing Restrictions: No    Mobility  Bed Mobility Overal bed mobility: Needs Assistance Bed Mobility: Supine to Sit     Supine to sit: Supervision;HOB elevated     General bed mobility comments: Pt came to EOB before PT had a chance to flatten and move rail  Transfers Overall transfer level: Needs assistance Equipment used: Rolling walker (2 wheeled) Transfers: Sit to/from Stand Sit to Stand: Supervision         General transfer comment: Good use of hands  Ambulation/Gait Ambulation/Gait assistance: Min guard Ambulation Distance (Feet): 16 Feet (6 plus 10) Assistive device: Rolling walker (2 wheeled) Gait Pattern/deviations: Step-through pattern;Trunk flexed Gait velocity: decreased   General Gait Details: Pt took seated rest break with 2/4 dyspnea   Stairs            Wheelchair Mobility    Modified Rankin (Stroke Patients Only)       Balance             Standing  balance-Leahy Scale: Poor Standing balance comment: requires UE support                    Cognition Arousal/Alertness: Awake/alert Behavior During Therapy: WFL for tasks assessed/performed Overall Cognitive Status: Within Functional Limits for tasks assessed                      Exercises      General Comments        Pertinent Vitals/Pain Pain Assessment: No/denies pain    Home Living                      Prior Function            PT Goals (current goals can now be found in the care plan section) Acute Rehab PT Goals Patient Stated Goal: To go home in few days PT Goal Formulation: With patient Time For Goal Achievement: 12/31/15 Potential to Achieve Goals: Fair Progress towards PT goals: Progressing toward goals    Frequency  Min 3X/week    PT Plan Current plan remains appropriate    Co-evaluation             End of Session Equipment Utilized During Treatment: Oxygen Activity Tolerance: Patient limited by fatigue Patient left: in chair;with call bell/phone within reach;with chair alarm set     Time: [id number] PT Time Calculation (min) (ACUTE ONLY): 27 min  Charges:  $  Gait Training: 23-37 mins                    G Codes:      Kirti Carl LUBECK 12/25/2015, 2:17 PM

## 2015-12-25 NOTE — Progress Notes (Signed)
Patient Name: [name] Date of Encounter: 12/25/2015  Primary Cardiologist: Dr. Rennis GoldenHilty  Patient profile:  82 y/o ? with a h/o CAD, HTN, DM II, and CKD s/p prior renal tx in 2003, who presented to the ED today with a 5 day h/o progressive fatigue, wkns, diaphoresis, and subsequent development of c/p and dyspnea - [facility]    Principal Problem:   Pneumococcal pneumonia (HCC) Active Problems:   S/P kidney transplant   Diastolic CHF (HCC)   CAD S/P remote RCA PCI   Hypertension   Elevated troponin   Bacteremia   CAP (community acquired pneumonia)   Pleural effusion on right   CKD (chronic kidney disease) stage 3, GFR 30-59 ml/min   PAF (paroxysmal atrial fibrillation) (HCC)    SUBJECTIVE  SOB improving. No CP in last 24 hours. Did have cp when first came in. Still coughing up clear phlegm  CURRENT MEDS . albuterol  2.5 mg Nebulization BID  . allopurinol  100 mg Oral QHS  . antiseptic oral rinse  7 mL Mouth Rinse BID  . aspirin EC  81 mg Oral Daily  . atorvastatin  10 mg Oral q1800  . cefTRIAXone (ROCEPHIN)  IV  2 g Intravenous Q24H  . cinacalcet  30 mg Oral Q supper  . diltiazem  240 mg Oral Daily  . famotidine  20 mg Oral BID  . guaiFENesin  1,200 mg Oral BID  . insulin aspart  0-15 Units Subcutaneous TID WC  . metoprolol  100 mg Oral BID  . mycophenolate  500 mg Oral BID  . sodium chloride flush  3 mL Intravenous Q12H  . tacrolimus  1.5 mg Oral QHS  . tacrolimus  1.5 mg Oral q morning - 10a    OBJECTIVE  Filed Vitals:   12/25/15 0500 12/25/15 0610 12/25/15 0815 12/25/15 1051  BP:  135/58  107/92  Pulse:  57  84  Temp:  97.6 F (36.4 C)    TempSrc:  Oral    Resp:  18    Height:      Weight: 164 lb (74.39 kg)     SpO2:  97% 99% 100%    Intake/Output Summary (Last 24 hours) at 12/25/15 1107 Last data filed at 12/25/15 0610  Gross per 24 hour  Intake 763.54 ml  Output   1050 ml  Net -286.46 ml   Filed Weights   12/23/15 2056 12/24/15  0545 12/25/15 0500  Weight: 166 lb 14.4 oz (75.705 kg) 167 lb 3.2 oz (75.841 kg) 164 lb (74.39 kg)    PHYSICAL EXAM  General: Pleasant, NAD. Neuro: Alert and oriented X 3. Moves all extremities spontaneously. Psych: Normal affect. HEENT:  Normal  Neck: Supple without bruits Lungs:  Resp regular and unlabored. Bilateral rhonchi on exam. Heart: RRR no s3, s4, or murmurs. Abdomen: Soft, non-tender, non-distended, BS + x 4.  Extremities: No clubbing, cyanosis or edema. DP/PT/Radials 2+ and equal bilaterally.  Accessory Clinical Findings  CBC  Recent Labs  12/24/15 0320 12/25/15 0534  WBC 12.2* 8.3  HGB 11.2* 11.3*  HCT 34.4* 35.8*  MCV 86.4 87.5  PLT 305 306   Basic Metabolic Panel  Recent Labs  12/24/15 0320 12/25/15 0534  NA 129* 134*  K 3.8 4.4  CL 97* 101  CO2 19* 23  GLUCOSE 126* 110*  BUN 69* 61*  CREATININE 1.96* 1.72*  CALCIUM 9.6 9.9   Liver Function Tests  Recent Labs  12/23/15 0241 12/24/15  1225 12/25/15 0534  AST 18  --  16  ALT 11*  --  9*  ALKPHOS 89  --  86  BILITOT 0.4  --  0.4  PROT 6.1* 6.2* 5.9*  ALBUMIN 2.8*  --  2.6*   TELE NSR with HR 50-60s    ECG  No new EKG  Echocardiogram 12/19/2015  LV EF: 55% - 60%  ------------------------------------------------------------------- Indications: Atrial fibrillation - 427.31.  ------------------------------------------------------------------- History: PMH: Dyspnea. Coronary artery disease. Risk factors: Chronic kidney disease, status post renal transplant. Hypertension. Diabetes mellitus. Dyslipidemia.  ------------------------------------------------------------------- Study Conclusions  - Left ventricle: The cavity size was normal. Wall thickness was  increased in a pattern of severe LVH. Systolic function was  normal. The estimated ejection fraction was in the range of 55%  to 60%. Wall motion was normal; there were no regional wall  motion  abnormalities. - Mitral valve: There was mild regurgitation. - Left atrium: The atrium was moderately dilated. - Tricuspid valve: There was moderate regurgitation. - Pulmonary arteries: Systolic pressure was moderately increased.  PA peak pressure: 44 mm Hg (S).  Impressions:  - Normal LV systolic function; severe LVH; moderate LAE; mild MR;  moderate TR; moderately elevated pulmonary pressure.     Radiology/Studies  Ct Chest Wo Contrast  12/23/2015  CLINICAL DATA:  Shortness of breath. Pleural effusion. Congestive heart failure and diabetes. EXAM: CT CHEST WITHOUT CONTRAST TECHNIQUE: Multidetector CT imaging of the chest was performed following the standard protocol without IV contrast. COMPARISON:  Chest radiograph dated 12/23/2015 FINDINGS: Mediastinum/Lymph Nodes: No masses or pathologically enlarged lymph nodes identified on this un-enhanced exam. The heart is enlarged. There is a small pericardial effusion. Atherosclerotic disease of the coronary arteries and thoracic aorta is noted. Layering material fills the esophagus dependently. Lungs/Pleura: There is a moderate to large water density right pleural effusion. There is right lower lobe airspace consolidation versus lobar atelectasis. There is patchy airspace consolidation in the basilar segments of the left lower lobe. Upper abdomen: No acute findings. Musculoskeletal: No chest wall mass or suspicious bone lesions identified. IMPRESSION: Large right pleural effusion with right lower lobe collapse. Patchy airspace disease within the dependent segments of the left lower lobe. Heterogeneous material filling the distal 2/3 of the esophagus. These findings raise the possibility of aspiration pneumonia. Endobronchial lesion causing collapse of the right lower lobe is also in the differential diagnosis, although one is not visualized on this nonenhanced exam. Alternatively the large right pleural effusion may be causing secondary compressive  atelectasis of the right lower lobe. Enlarged cardiac silhouette. Atherosclerotic disease of the coronary arteries and aorta. Electronically Signed   By: Ted Mcalpine [name]   On: 12/23/2015 14:41   Dg Chest Port 1 View  12/25/2015  CLINICAL DATA:  Pleural effusion.  Thoracentesis. EXAM: PORTABLE CHEST 1 VIEW COMPARISON:  12/24/2015.  CT 12/23/2015. FINDINGS: Mediastinum and hilar structures are stable. Stable cardiomegaly. Continued re-expansion of right base atelectasis. Right base infiltrate present. Small right pleural effusion. No pneumothorax. IMPRESSION: 1. Continued re-expansion of right base atelectasis. Right base infiltrates present. Small right pleural effusion. 2. Stable cardiomegaly.  No pulmonary venous congestion . Electronically Signed   By: Maisie Fus  Register   On: 12/25/2015 07:42   Dg Chest Port 1 View  12/24/2015  CLINICAL DATA:  Status post right thoracentesis EXAM: PORTABLE CHEST 1 VIEW COMPARISON:  Earlier today FINDINGS: Decreased right pleural effusion, trace to small currently. No pneumothorax or re-expansion edema. Patchy opacity in the right base along its  is presumably incomplete resolution of atelectasis given findings of right middle and lower lobe collapse on chest CT 12/23/2015. The left lung is clear. Stable aortic contours. IMPRESSION: 1. No acute finding after right thoracentesis. There is minimal residual fluid. 2. Partial re-expansion of the collapsed right middle and lower lobe seen on CT yesterday. Electronically Signed   By: Marnee Spring [name]   On: 12/24/2015 13:59   Dg Chest Port 1 View  12/24/2015  CLINICAL DATA:  Shortness of breath.  Pleural effusion . EXAM: PORTABLE CHEST 1 VIEW COMPARISON:  CT 12/23/2015.  Chest x-ray 12/23/2015. FINDINGS: Mediastinum hilar structures are stable. Stable cardiomegaly. Stable right lower lobe atelectasis and/or consolidation and right pleural effusion. Left lung is clear. No pneumothorax. IMPRESSION: 1. Stable right lower  lobe atelectasis and/or consolidation and right pleural effusion. 2. Stable cardiomegaly. Electronically Signed   By: Maisie Fus  Register   On: 12/24/2015 07:11   Dg Chest Port 1 View  12/23/2015  CLINICAL DATA:  Pneumonia. EXAM: PORTABLE CHEST 1 VIEW COMPARISON:  12/22/2015. FINDINGS: Stable cardiomegaly. Progressive infiltrate right lower lobe. Progressive right pleural effusion. Mild left base subsegmental atelectasis and or infiltrate. Left hemidiaphragm not imaged. No pneumothorax. IMPRESSION: 1. Progressive right lower lobe infiltrate and right pleural effusion. 2.  Left base subsegmental atelectasis and or mild infiltrate. 3.  Cardiomegaly.  No pulmonary venous congestion . Electronically Signed   By: Maisie Fus  Register   On: 12/23/2015 07:18   Dg Chest Port 1 View  12/22/2015  CLINICAL DATA:  Continued assessment of pleural effusion. EXAM: PORTABLE CHEST 1 VIEW COMPARISON:  12/21/2015. FINDINGS: Cardiomegaly. Large RIGHT pleural effusion is increased. Compressive atelectasis at the RIGHT base. Trace LEFT effusion may be improved. Overall improved vascular congestion. IMPRESSION: Increasing RIGHT pleural effusion otherwise improved exam. Electronically Signed   By: Elsie Stain [name]   On: 12/22/2015 07:27   Dg Chest Port 1 View  12/21/2015  CLINICAL DATA:  Community-acquired pneumonia EXAM: PORTABLE CHEST 1 VIEW COMPARISON:  Chest radiograph from one day prior. FINDINGS: Stable cardiomediastinal silhouette with mild cardiomegaly. No pneumothorax. Moderate right and small left pleural effusions, increased bilaterally. Mild pulmonary edema, increased. Patchy bibasilar lung consolidation, right greater than left, slightly worsened bilaterally. IMPRESSION: 1. Patchy bibasilar lung consolidation, right greater than left, slightly worsened bilaterally, likely a combination of atelectasis and pneumonia. 2. Stable mild cardiomegaly with increased mild pulmonary edema. 3. Moderate right and small left pleural  effusions, increased bilaterally. Electronically Signed   By: Delbert Phenix [name]   On: 12/21/2015 08:39   Dg Chest Port 1 View  12/20/2015  CLINICAL DATA:  Shortness of breath EXAM: PORTABLE CHEST 1 VIEW COMPARISON:  12/18/2015 FINDINGS: New right lower lung opacity, likely combination of atelectasis/ pneumonia and layering pleural effusion. Mild left basilar opacity, possibly atelectasis. Cardiomegaly.  No frank interstitial edema. No pneumothorax. IMPRESSION: New right lower lung opacity, likely combination of atelectasis/ pneumonia and layering pleural effusion. Mild left basilar opacity, possibly atelectasis. Electronically Signed   By: Charline Bills [name]   On: 12/20/2015 11:53   Dg Chest Port 1 View  12/18/2015  CLINICAL DATA:  Productive cough for 2 weeks, shortness of breath, headache EXAM: PORTABLE CHEST 1 VIEW COMPARISON:  08/11/2014 FINDINGS: Borderline cardiomegaly. No pulmonary edema. There is streaky left base retrocardiac atelectasis or early infiltrate. Atherosclerotic but for sclerotic calcifications of thoracic aorta. IMPRESSION: Streaky left base retrocardiac atelectasis or early infiltrate. No pulmonary edema. Follow-up to resolution recommended. Electronically Signed   By: Lang Snow  Pop [name]   On: 12/18/2015 09:38    ASSESSMENT AND PLAN  1. Afib with RVR, CHA2DS2VASc = 7.  - converted to NSR on rate control. Plan to add NOAC once ischemic workup is negative prior to discharge, note current Cr still elevated at 1.7  2. NSTEMI/ CAD s/p RCA BMS in 2002 - residual LAD and Diag dzs  - Initially felt she needs cath, later per Dr. Allyson Sabal, suspect demand ischemia from rapid afib and PNA, given renal transplant h/o and risk of nephrotoxicity of contrast dye, plan for myoview later.  - still has bilateral rhonchi on exam, but off O2, will put her on scheduler for lexiscan myoview tomorrow.   3. R pleural effusion: s/p thoracentesis with clear fluid removed.  4. Acute on Chronic  diastolic HF  - difficult to assess fluid level, there is a lot of rhonchi on exam, but no obvious rale. Pending 1 dose IV lasix today, switching to PO lasix tomorrow.  5. HTN 6. HLD 7. DM II 8. CKD stage III s/p h/o renal transplant 9. Possible PNA: Cough and intermittent diaphoresis with persistent fatigue since 5/14. CXR with left base infiltrate. Blood culture positive for strept pneum.   Ramond Dial PA-C Pager: [phone] Agree with note by Azalee Course PA-C  Feeling much clinically improved after Right thoracentesis (1.5 l). CXR better. BS present of right. SCr decreasing. For [employer].  Runell Gess, [name], FACP, [facility], [facility] [address] [place], [place]  [address]  [phone] 12/25/2015 1:07 PM

## 2015-12-26 ENCOUNTER — Inpatient Hospital Stay (HOSPITAL_COMMUNITY): Payer: Medicare Other

## 2015-12-26 DIAGNOSIS — R7989 Other specified abnormal findings of blood chemistry: Secondary | ICD-10-CM

## 2015-12-26 DIAGNOSIS — I5033 Acute on chronic diastolic (congestive) heart failure: Secondary | ICD-10-CM

## 2015-12-26 LAB — GLUCOSE, CAPILLARY
GLUCOSE-CAPILLARY: 121 mg/dL — AB (ref 65–99)
GLUCOSE-CAPILLARY: 98 mg/dL (ref 65–99)
Glucose-Capillary: 131 mg/dL — ABNORMAL HIGH (ref 65–99)
Glucose-Capillary: 175 mg/dL — ABNORMAL HIGH (ref 65–99)

## 2015-12-26 LAB — COMPREHENSIVE METABOLIC PANEL
ALBUMIN: 2.8 g/dL — AB (ref 3.5–5.0)
ALT: 11 U/L — ABNORMAL LOW (ref 14–54)
ANION GAP: 10 (ref 5–15)
AST: 19 U/L (ref 15–41)
Alkaline Phosphatase: 87 U/L (ref 38–126)
BUN: 49 mg/dL — AB (ref 6–20)
CHLORIDE: 102 mmol/L (ref 101–111)
CO2: 22 mmol/L (ref 22–32)
Calcium: 9.6 mg/dL (ref 8.9–10.3)
Creatinine, Ser: 1.53 mg/dL — ABNORMAL HIGH (ref 0.44–1.00)
GFR calc Af Amer: 35 mL/min — ABNORMAL LOW (ref >60)
GFR calc non Af Amer: 31 mL/min — ABNORMAL LOW (ref >60)
GLUCOSE: 104 mg/dL — AB (ref 65–99)
POTASSIUM: 3.8 mmol/L (ref 3.5–5.1)
Sodium: 134 mmol/L — ABNORMAL LOW (ref 135–145)
TOTAL PROTEIN: 6 g/dL — AB (ref 6.5–8.1)
Total Bilirubin: 0.3 mg/dL (ref 0.3–1.2)

## 2015-12-26 LAB — NM MYOCAR MULTI W/SPECT W/WALL MOTION / EF
CHL CUP MPHR: 138 {beats}/min
CSEPEW: 1 METS
CSEPHR: 41 %
CSEPPHR: 57 {beats}/min
Exercise duration (min): 0 min
Exercise duration (sec): 0 s
Rest HR: 48 {beats}/min

## 2015-12-26 LAB — TACROLIMUS LEVEL: Tacrolimus (FK506) - LabCorp: 8 ng/mL (ref 2.0–20.0)

## 2015-12-26 MED ORDER — TECHNETIUM TC 99M TETROFOSMIN IV KIT
10.0000 | PACK | Freq: Once | INTRAVENOUS | Status: AC | PRN
  Administered 2015-12-26: 10 via INTRAVENOUS

## 2015-12-26 MED ORDER — TECHNETIUM TC 99M TETROFOSMIN IV KIT
30.0000 | PACK | Freq: Once | INTRAVENOUS | Status: AC | PRN
  Administered 2015-12-26: 30 via INTRAVENOUS

## 2015-12-26 MED ORDER — APIXABAN 2.5 MG PO TABS
2.5000 mg | ORAL_TABLET | Freq: Two times a day (BID) | ORAL | Status: DC
  Administered 2015-12-26 – 2016-01-01 (×13): 2.5 mg via ORAL
  Filled 2015-12-26 (×13): qty 1

## 2015-12-26 MED ORDER — REGADENOSON 0.4 MG/5ML IV SOLN
0.4000 mg | Freq: Once | INTRAVENOUS | Status: AC
  Administered 2015-12-26: 0.4 mg via INTRAVENOUS
  Filled 2015-12-26: qty 5

## 2015-12-26 MED ORDER — REGADENOSON 0.4 MG/5ML IV SOLN
INTRAVENOUS | Status: AC
  Filled 2015-12-26: qty 5

## 2015-12-26 NOTE — Progress Notes (Signed)
ANTICOAGULATION CONSULT NOTE - Follow Up Consult  Pharmacy Consult for Heparin to Eliquis Indication: elevated troponins, Afib  Allergies  Allergen Reactions  . Sulfa Drugs Cross Reactors Swelling    Patient Measurements: Height: 5\' 3"  (160 cm) Weight: 165 lb 3.2 oz (74.934 kg) IBW/kg (Calculated) : 52.4 Heparin Dosing Weight: 68 kg  Vital Signs: Temp: 97.7 F (36.5 C) (05/26 0501) Temp Source: Oral (05/26 0501) BP: 136/57 mmHg (05/26 1214) Pulse Rate: 51 (05/26 1214)  Labs:  Recent Labs  12/23/15 1607 12/23/15 2114 12/24/15 0320 12/25/15 0534  HGB  --   --  11.2* 11.3*  HCT  --   --  34.4* 35.8*  PLT  --   --  305 306  LABPROT 16.9*  --   --   --   INR 1.36  --   --   --   HEPARINUNFRC  --  0.67 0.43 0.46  CREATININE  --   --  1.96* 1.72*    Estimated Creatinine Clearance: 24.4 mL/min (by C-G formula based on Cr of 1.72).  Assessment: 82 y.o. F initially started on heparin for r/o ACS, then continued for new onset Afib with CHA2DS2VASc = 7.   Transitioning from heparin to Eliquis  5/26 is Day # 9 of Rocephin therapy     Goal of Therapy:  Appropriate dosing   Plan:  DC heparin and heparin labs Eliquis 2.5 mg po BID   Thank you Okey RegalLisa Pat Sires, PharmD [phone]

## 2015-12-26 NOTE — Progress Notes (Signed)
CARDIAC REHAB PHASE I   PRE:  Rate/Rhythm: 54 SB  BP:  Supine: 104/55  Sitting:   Standing:    SaO2: 94%2L  MODE:  Ambulation: 40 ft   POST:  Rate/Rhythm: 61 SR  BP:  Supine:   Sitting: 110/62  Standing:    SaO2: 97% 2L 1527-1605 Pt walked 40 ft with rollator and asst x 2 on 2L and gait belt use. Sat once at 20 ft to rest. To recliner after walk. Will keep as asst x 2. Generalized weakness. Chair alarm on.   Luetta Nuttingharlene Karo Rog, RN BSN  12/26/2015 4:01 PM

## 2015-12-26 NOTE — Progress Notes (Deleted)
ANTICOAGULATION CONSULT NOTE - Follow Up Consult  Pharmacy Consult for Heparin to Eliquis Indication: elevated troponins, Afib  Allergies  Allergen Reactions  . Sulfa Drugs Cross Reactors Swelling    Patient Measurements: Height: 5\' 3"  (160 cm) Weight: 165 lb 3.2 oz (74.934 kg) IBW/kg (Calculated) : 52.4 Heparin Dosing Weight: 68 kg  Vital Signs: Temp: 97.7 F (36.5 C) (05/26 0501) Temp Source: Oral (05/26 0501) BP: 136/57 mmHg (05/26 1214) Pulse Rate: 51 (05/26 1214)  Labs:  Recent Labs  12/23/15 1607 12/23/15 2114 12/24/15 0320 12/25/15 0534  HGB  --   --  11.2* 11.3*  HCT  --   --  34.4* 35.8*  PLT  --   --  305 306  LABPROT 16.9*  --   --   --   INR 1.36  --   --   --   HEPARINUNFRC  --  0.67 0.43 0.46  CREATININE  --   --  1.96* 1.72*    Estimated Creatinine Clearance: 24.4 mL/min (by C-G formula based on Cr of 1.72).  Assessment: 82 y.o. F initially started on heparin for r/o ACS, then continued for new onset Afib with CHA2DS2VASc = 7.   Transitioning from heparin to Eliquis  Goal of Therapy:  Appropriate dosing   Plan:  DC heparin and heparin labs Eliquis 2.5 mg po BID   Thank you Okey RegalLisa Jonavan Vanhorn, PharmD [phone]

## 2015-12-26 NOTE — Progress Notes (Signed)
PROGRESS NOTE                                                                                                                                                                                                             Patient Demographics:    [name], is a 82 y.o. female, DOB - 1931/11/14, VQQ:[id number]  Admit date - 12/18/2015   Admitting Physician Kathleene Hazel, MD  Outpatient Primary MD for the patient is No primary care provider on file.  LOS - 8  Outpatient Specialists: Nephrology cardiology  Chief Complaint  Patient presents with  . Shortness of Breath  . Chest Pain       Brief Narrative   82 y.o. female with a history of CAD, HTN, DM2, CKD s/p renal transplant in 2003, admitted on 5/18 after presenting with shortness of breath and chest pain, found to have A fib with RVR (CHADVASC 7), as well as NSTEMI from demand ischemia. Also had a pneumococcal pneumonia  Hospital course complicated due to development of acute hypoxic respiratory failure with pneumococcal pneumonia. [facility] PCCM involving care. Nephrology consult and given worsened renal function. Patient transferred to hospitalist service for further care.Patient status post bedside thoracentesis on 5/24. Schedule for Lexiscan Myoview on 5/26    Subjective:   Generally weak but otherwise significantly less short of breath   Assessment  & Plan :   Acute respiratory failure with hypoxia secondary to pneumococcal pneumonia/Strep pneumo bacteremia and pleural effusion Serial chest x-ray now shows continued reexpansion of the right base. Receiving empiric IV Rocephin (10 day course) started 5/18. Continue nebulizers, Mucinex and pulmonary toilet.  PCCM  recommended CT scan that showed a large right pleural effusion with right lower lobe collapse and patchy airspace disease of the left lower lobe, finding suspicion for aspiration pneumonia, endobronchial  lesion causing collapse of the right lower lobe is also in the differential diagnosis s/p thoracentesis bedside on 5/24 with 1460 removed, clear yellow. Likely exudative by LDH, repeat chest x-ray shows reexpansion of the right base, follow pleural fluid culture from 5/24, no growth from blood culture from 5/22    Acute on Chronic kidney disease stage III  Continue Prograf. Creatinine 2.32>1.29> 1.72 suspected to be hemodynamically mediated with atrial fibrillation/rapid ventricular response vs ATN, labs pending from this morning Status post renal transplant in 2003  maintained on Tac/MMF with baseline creatinine between 1.0-1.4. Continue to monitor Prograf levels  A. fib with RVR,CHA2DS2VASc = 7 Required Cardizem drip on admission. Now stable on oral Cardizem and metoprolol. 2-D echo showed normal EF. now in sinus rhythm. On IV heparin with plan on starting NOAC    NSTE MI Likely secondary to demand ischemia with rapid A. fib. On IV heparin. Plan on Lexiscan Myoview 5/26  Acute on Chronic Diastolic CHF: BNP was abnormal at 1200 on admit. . Renal function fluctuating. She was on 80 mg PO lasix as an outpatient.  Currently Lasix on hold, follow BMP closely.   hold ARB. Strict I/Os.    diabetes mellitus type II-stable  Continue sliding scale coverage. Hemoglobin A1c 6.9   physical deconditioning PT recommended home health-ordered   Code Status: full code Family Communication  :None at bedside Disposition Plan  : Pending further cardiology recommendations (stress test once respiratory function improved, PT evaluation)   Barriers For Discharge :  Pending cardiac workup  Consults  :  Cardiology PCCM Renal   Procedures  :  2-D echo  DVT Prophylaxis  :  IV heparin  Lab Results  Component Value Date   PLT 306 12/25/2015    Antibiotics  :   Rocephin 5/18- Azithromycin 5/18-5/19      Objective:   Filed Vitals:   12/25/15 1225 12/25/15 2004 12/25/15 2100 12/26/15 0501    BP: 127/82  116/48 117/61  Pulse: 82  69 52  Temp: 98.1 F (36.7 C)  98.7 F (37.1 C) 97.7 F (36.5 C)  TempSrc: Oral  Oral Oral  Resp: 18  18 20   Height:      Weight:    74.934 kg (165 lb 3.2 oz)  SpO2: 98% 99% 98% 94%    Wt Readings from Last 3 Encounters:  12/26/15 74.934 kg (165 lb 3.2 oz)  08/17/14 70.126 kg (154 lb 9.6 oz)  03/20/14 75.501 kg (166 lb 7.2 oz)     Intake/Output Summary (Last 24 hours) at 12/26/15 0757 Last data filed at 12/26/15 1610  Gross per 24 hour  Intake 598.75 ml  Output   1000 ml  Net -401.25 ml     Physical Exam  Gen: not in distress, Appears fatigued  HEENT: no pallor, moist mucosa, supple neck chest: diminished right-sided breath sounds CVS: N S1&S2, no murmurs, rubs or gallop GI: soft, NT, ND, BS+ Musculoskeletal: warm, no edema CNS: AAOX3, non focal    Data Review:    CBC  Recent Labs Lab 12/21/15 0252 12/22/15 0243 12/23/15 0241 12/24/15 0320 12/25/15 0534  WBC 14.6* 13.2* 13.4* 12.2* 8.3  HGB 11.5* 11.6* 11.4* 11.2* 11.3*  HCT 36.2 35.1* 35.0* 34.4* 35.8*  PLT 280 309 280 305 306  MCV 88.7 85.8 86.4 86.4 87.5  MCH 28.2 28.4 28.1 28.1 27.6  MCHC 31.8 33.0 32.6 32.6 31.6  RDW 13.8 13.8 13.9 13.8 14.0    Chemistries   Recent Labs Lab 12/21/15 0252 12/22/15 0243 12/23/15 0241 12/24/15 0320 12/25/15 0534  NA 129* 128* 131* 129* 134*  K 3.9 3.9 3.6 3.8 4.4  CL 95* 94* 98* 97* 101  CO2 17* 19* 20* 19* 23  GLUCOSE 147* 137* 119* 126* 110*  BUN 47* 59* 70* 69* 61*  CREATININE 2.05* 2.32* 1.29* 1.96* 1.72*  CALCIUM 9.0 8.9 9.2 9.6 9.9  AST  --   --  18  --  16  ALT  --   --  11*  --  9*  ALKPHOS  --   --  89  --  86  BILITOT  --   --  0.4  --  0.4   ------------------------------------------------------------------------------------------------------------------ No results for input(s): CHOL, HDL, LDLCALC, TRIG, CHOLHDL, LDLDIRECT in the last 72 hours.  Lab Results  Component Value Date   HGBA1C 6.9*  12/18/2015   ------------------------------------------------------------------------------------------------------------------ No results for input(s): TSH, T4TOTAL, T3FREE, THYROIDAB in the last 72 hours.  Invalid input(s): FREET3 ------------------------------------------------------------------------------------------------------------------ No results for input(s): VITAMINB12, FOLATE, FERRITIN, TIBC, IRON, RETICCTPCT in the last 72 hours.  Coagulation profile  Recent Labs Lab 12/23/15 1607  INR 1.36    No results for input(s): DDIMER in the last 72 hours.  Cardiac Enzymes No results for input(s): CKMB, TROPONINI, MYOGLOBIN in the last 168 hours.  Invalid input(s): CK ------------------------------------------------------------------------------------------------------------------    Component Value Date/Time   BNP 1232.3* 12/18/2015 0830    Inpatient Medications  Scheduled Meds: . albuterol  2.5 mg Nebulization BID  . allopurinol  100 mg Oral QHS  . antiseptic oral rinse  7 mL Mouth Rinse BID  . aspirin EC  81 mg Oral Daily  . atorvastatin  10 mg Oral q1800  . cefTRIAXone (ROCEPHIN)  IV  2 g Intravenous Q24H  . cinacalcet  30 mg Oral Q supper  . diltiazem  240 mg Oral Daily  . famotidine  20 mg Oral Daily  . furosemide  80 mg Oral Daily  . guaiFENesin  1,200 mg Oral BID  . insulin aspart  0-15 Units Subcutaneous TID WC  . metoprolol  100 mg Oral BID  . mycophenolate  500 mg Oral BID  . sodium chloride flush  3 mL Intravenous Q12H  . tacrolimus  1.5 mg Oral QHS  . tacrolimus  1.5 mg Oral q morning - 10a   Continuous Infusions: . sodium chloride 10 mL/hr (12/24/15 0732)  . heparin 1,250 Units/hr (12/26/15 0642)   PRN Meds:.sodium chloride, acetaminophen, albuterol, nitroGLYCERIN, ondansetron (ZOFRAN) IV, senna-docusate, sodium chloride flush, traMADol  Micro Results Recent Results (from the past 240 hour(s))  Culture, blood (Routine X 2) w Reflex to ID  Panel     Status: Abnormal   Collection Time: 12/18/15 10:49 AM  Result Value Ref Range Status   Specimen Description BLOOD RIGHT HAND  Final   Special Requests BOTTLES DRAWN AEROBIC AND ANAEROBIC  5CC  Final   Culture  Setup Time   Final    GRAM POSITIVE COCCI IN CHAINS IN PAIRS IN BOTH AEROBIC AND ANAEROBIC BOTTLES Organism ID to follow CRITICAL RESULT CALLED TO, READ BACK BY AND VERIFIED WITH: CARON AMEND,PHARMD@0050  12/19/15 MKELLY    Culture STREPTOCOCCUS PNEUMONIAE (A)  Final   Report Status 12/21/2015 FINAL  Final   Organism ID, Bacteria STREPTOCOCCUS PNEUMONIAE  Final      Susceptibility   Streptococcus pneumoniae - MIC*    ERYTHROMYCIN 2 RESISTANT Resistant     LEVOFLOXACIN 0.5 SENSITIVE Sensitive     VANCOMYCIN 0.5 SENSITIVE Sensitive     PENICILLIN 0.25 SENSITIVE Sensitive     CEFTRIAXONE 0.25 SENSITIVE Sensitive     * STREPTOCOCCUS PNEUMONIAE  Blood Culture ID Panel (Reflexed)     Status: Abnormal   Collection Time: 12/18/15 10:49 AM  Result Value Ref Range Status   Enterococcus species NOT DETECTED NOT DETECTED Final   Vancomycin resistance NOT DETECTED NOT DETECTED Final   Listeria monocytogenes NOT DETECTED NOT DETECTED Final   Staphylococcus species NOT DETECTED NOT DETECTED Final   Staphylococcus aureus NOT DETECTED  NOT DETECTED Final   Methicillin resistance NOT DETECTED NOT DETECTED Final   Streptococcus species NOT DETECTED NOT DETECTED Final   Streptococcus agalactiae NOT DETECTED NOT DETECTED Final   Streptococcus pneumoniae DETECTED (A) NOT DETECTED Final    Comment: CRITICAL RESULT CALLED TO, READ BACK BY AND VERIFIED WITH: CARON AMEND,PHARMD @0050  12/19/15 MKELLY    Streptococcus pyogenes NOT DETECTED NOT DETECTED Final   Acinetobacter baumannii NOT DETECTED NOT DETECTED Final   Enterobacteriaceae species NOT DETECTED NOT DETECTED Final   Enterobacter cloacae complex NOT DETECTED NOT DETECTED Final   Escherichia coli NOT DETECTED NOT DETECTED Final    Klebsiella oxytoca NOT DETECTED NOT DETECTED Final   Klebsiella pneumoniae NOT DETECTED NOT DETECTED Final   Proteus species NOT DETECTED NOT DETECTED Final   Serratia marcescens NOT DETECTED NOT DETECTED Final   Carbapenem resistance NOT DETECTED NOT DETECTED Final   Haemophilus influenzae NOT DETECTED NOT DETECTED Final   Neisseria meningitidis NOT DETECTED NOT DETECTED Final   Pseudomonas aeruginosa NOT DETECTED NOT DETECTED Final   Candida albicans NOT DETECTED NOT DETECTED Final   Candida glabrata NOT DETECTED NOT DETECTED Final   Candida krusei NOT DETECTED NOT DETECTED Final   Candida parapsilosis NOT DETECTED NOT DETECTED Final   Candida tropicalis NOT DETECTED NOT DETECTED Final  Culture, blood (Routine X 2) w Reflex to ID Panel     Status: Abnormal   Collection Time: 12/18/15 10:59 AM  Result Value Ref Range Status   Specimen Description BLOOD LEFT ANTECUBITAL  Final   Special Requests BOTTLES DRAWN AEROBIC AND ANAEROBIC  5CC  Final   Culture  Setup Time   Final    GRAM POSITIVE COCCI IN PAIRS IN CHAINS IN BOTH AEROBIC AND ANAEROBIC BOTTLES CRITICAL RESULT CALLED TO, READ BACK BY AND VERIFIED WITH: CARON AMEND,PHARMD @0050  12/19/15 KELLYM    Culture (A)  Final    STREPTOCOCCUS PNEUMONIAE SUSCEPTIBILITIES PERFORMED ON PREVIOUS CULTURE WITHIN THE LAST 5 DAYS.    Report Status 12/21/2015 FINAL  Final  MRSA PCR Screening     Status: Abnormal   Collection Time: 12/18/15  9:20 PM  Result Value Ref Range Status   MRSA by PCR POSITIVE (A) NEGATIVE Final    Comment:        The GeneXpert MRSA Assay (FDA approved for NASAL specimens only), is one component of a comprehensive MRSA colonization surveillance program. It is not intended to diagnose MRSA infection nor to guide or monitor treatment for MRSA infections. RESULT CALLED TO, READ BACK BY AND VERIFIED WITH: S ROUSE,RN @0302  12/19/15 MKELLY   Culture, blood (Routine X 2) w Reflex to ID Panel     Status: None  (Preliminary result)   Collection Time: 12/22/15 10:58 AM  Result Value Ref Range Status   Specimen Description BLOOD RIGHT HAND  Final   Special Requests IN PEDIATRIC BOTTLE  3CC  Final   Culture NO GROWTH 3 DAYS  Final   Report Status PENDING  Incomplete  Culture, blood (Routine X 2) w Reflex to ID Panel     Status: None (Preliminary result)   Collection Time: 12/22/15 11:00 AM  Result Value Ref Range Status   Specimen Description BLOOD RIGHT HAND  Final   Special Requests IN PEDIATRIC BOTTLE 3.0CC  Final   Culture NO GROWTH 3 DAYS  Final   Report Status PENDING  Incomplete  Gram stain     Status: None   Collection Time: 12/24/15  9:01 PM  Result Value Ref Range  Status   Specimen Description PLEURAL  Final   Special Requests Immunocompromised  Final   Gram Stain   Final    CYTOSPIN SMEAR WBC PRESENT,BOTH PMN AND MONONUCLEAR NO ORGANISMS SEEN    Report Status 12/25/2015 FINAL  Final  Culture, body fluid-bottle     Status: None (Preliminary result)   Collection Time: 12/24/15  9:01 PM  Result Value Ref Range Status   Specimen Description PLEURAL  Final   Special Requests AEROBIC BOTTLE ONLY  Final   Culture PENDING  Incomplete   Report Status PENDING  Incomplete    Radiology Reports Ct Chest Wo Contrast  12/23/2015  CLINICAL DATA:  Shortness of breath. Pleural effusion. Congestive heart failure and diabetes. EXAM: CT CHEST WITHOUT CONTRAST TECHNIQUE: Multidetector CT imaging of the chest was performed following the standard protocol without IV contrast. COMPARISON:  Chest radiograph dated 12/23/2015 FINDINGS: Mediastinum/Lymph Nodes: No masses or pathologically enlarged lymph nodes identified on this un-enhanced exam. The heart is enlarged. There is a small pericardial effusion. Atherosclerotic disease of the coronary arteries and thoracic aorta is noted. Layering material fills the esophagus dependently. Lungs/Pleura: There is a moderate to large water density right pleural  effusion. There is right lower lobe airspace consolidation versus lobar atelectasis. There is patchy airspace consolidation in the basilar segments of the left lower lobe. Upper abdomen: No acute findings. Musculoskeletal: No chest wall mass or suspicious bone lesions identified. IMPRESSION: Large right pleural effusion with right lower lobe collapse. Patchy airspace disease within the dependent segments of the left lower lobe. Heterogeneous material filling the distal 2/3 of the esophagus. These findings raise the possibility of aspiration pneumonia. Endobronchial lesion causing collapse of the right lower lobe is also in the differential diagnosis, although one is not visualized on this nonenhanced exam. Alternatively the large right pleural effusion may be causing secondary compressive atelectasis of the right lower lobe. Enlarged cardiac silhouette. Atherosclerotic disease of the coronary arteries and aorta. Electronically Signed   By: Ted Mcalpine [name]   On: 12/23/2015 14:41   Dg Chest Port 1 View  12/25/2015  CLINICAL DATA:  Pleural effusion.  Thoracentesis. EXAM: PORTABLE CHEST 1 VIEW COMPARISON:  12/24/2015.  CT 12/23/2015. FINDINGS: Mediastinum and hilar structures are stable. Stable cardiomegaly. Continued re-expansion of right base atelectasis. Right base infiltrate present. Small right pleural effusion. No pneumothorax. IMPRESSION: 1. Continued re-expansion of right base atelectasis. Right base infiltrates present. Small right pleural effusion. 2. Stable cardiomegaly.  No pulmonary venous congestion . Electronically Signed   By: Maisie Fus  Register   On: 12/25/2015 07:42   Dg Chest Port 1 View  12/24/2015  CLINICAL DATA:  Status post right thoracentesis EXAM: PORTABLE CHEST 1 VIEW COMPARISON:  Earlier today FINDINGS: Decreased right pleural effusion, trace to small currently. No pneumothorax or re-expansion edema. Patchy opacity in the right base along its is presumably incomplete resolution of  atelectasis given findings of right middle and lower lobe collapse on chest CT 12/23/2015. The left lung is clear. Stable aortic contours. IMPRESSION: 1. No acute finding after right thoracentesis. There is minimal residual fluid. 2. Partial re-expansion of the collapsed right middle and lower lobe seen on CT yesterday. Electronically Signed   By: Marnee Spring [name]   On: 12/24/2015 13:59   Dg Chest Port 1 View  12/24/2015  CLINICAL DATA:  Shortness of breath.  Pleural effusion . EXAM: PORTABLE CHEST 1 VIEW COMPARISON:  CT 12/23/2015.  Chest x-ray 12/23/2015. FINDINGS: Mediastinum hilar structures are stable. Stable cardiomegaly.  Stable right lower lobe atelectasis and/or consolidation and right pleural effusion. Left lung is clear. No pneumothorax. IMPRESSION: 1. Stable right lower lobe atelectasis and/or consolidation and right pleural effusion. 2. Stable cardiomegaly. Electronically Signed   By: Maisie Fus  Register   On: 12/24/2015 07:11   Dg Chest Port 1 View  12/23/2015  CLINICAL DATA:  Pneumonia. EXAM: PORTABLE CHEST 1 VIEW COMPARISON:  12/22/2015. FINDINGS: Stable cardiomegaly. Progressive infiltrate right lower lobe. Progressive right pleural effusion. Mild left base subsegmental atelectasis and or infiltrate. Left hemidiaphragm not imaged. No pneumothorax. IMPRESSION: 1. Progressive right lower lobe infiltrate and right pleural effusion. 2.  Left base subsegmental atelectasis and or mild infiltrate. 3.  Cardiomegaly.  No pulmonary venous congestion . Electronically Signed   By: Maisie Fus  Register   On: 12/23/2015 07:18   Dg Chest Port 1 View  12/22/2015  CLINICAL DATA:  Continued assessment of pleural effusion. EXAM: PORTABLE CHEST 1 VIEW COMPARISON:  12/21/2015. FINDINGS: Cardiomegaly. Large RIGHT pleural effusion is increased. Compressive atelectasis at the RIGHT base. Trace LEFT effusion may be improved. Overall improved vascular congestion. IMPRESSION: Increasing RIGHT pleural effusion otherwise  improved exam. Electronically Signed   By: Elsie Stain [name]   On: 12/22/2015 07:27   Dg Chest Port 1 View  12/21/2015  CLINICAL DATA:  Community-acquired pneumonia EXAM: PORTABLE CHEST 1 VIEW COMPARISON:  Chest radiograph from one day prior. FINDINGS: Stable cardiomediastinal silhouette with mild cardiomegaly. No pneumothorax. Moderate right and small left pleural effusions, increased bilaterally. Mild pulmonary edema, increased. Patchy bibasilar lung consolidation, right greater than left, slightly worsened bilaterally. IMPRESSION: 1. Patchy bibasilar lung consolidation, right greater than left, slightly worsened bilaterally, likely a combination of atelectasis and pneumonia. 2. Stable mild cardiomegaly with increased mild pulmonary edema. 3. Moderate right and small left pleural effusions, increased bilaterally. Electronically Signed   By: Delbert Phenix [name]   On: 12/21/2015 08:39   Dg Chest Port 1 View  12/20/2015  CLINICAL DATA:  Shortness of breath EXAM: PORTABLE CHEST 1 VIEW COMPARISON:  12/18/2015 FINDINGS: New right lower lung opacity, likely combination of atelectasis/ pneumonia and layering pleural effusion. Mild left basilar opacity, possibly atelectasis. Cardiomegaly.  No frank interstitial edema. No pneumothorax. IMPRESSION: New right lower lung opacity, likely combination of atelectasis/ pneumonia and layering pleural effusion. Mild left basilar opacity, possibly atelectasis. Electronically Signed   By: Charline Bills [name]   On: 12/20/2015 11:53   Dg Chest Port 1 View  12/18/2015  CLINICAL DATA:  Productive cough for 2 weeks, shortness of breath, headache EXAM: PORTABLE CHEST 1 VIEW COMPARISON:  08/11/2014 FINDINGS: Borderline cardiomegaly. No pulmonary edema. There is streaky left base retrocardiac atelectasis or early infiltrate. Atherosclerotic but for sclerotic calcifications of thoracic aorta. IMPRESSION: Streaky left base retrocardiac atelectasis or early infiltrate. No pulmonary  edema. Follow-up to resolution recommended. Electronically Signed   By: Natasha Mead [name]   On: 12/18/2015 09:38    Time Spent in minutes  35   Tache Bobst M.D on 12/26/2015 at 7:57 AM  Between 7am to 7pm - Pager - [phone]  After 7pm go to [email or web address] - password [facility]  [facility] -  Office  [phone]

## 2015-12-26 NOTE — Progress Notes (Signed)
Spoke w pt. Gave her list of hhc agencies. No pref but prefers something attached w cone. Made ref to donna w [facility] for [facility]pt-h[facility]-bath aid.da to work on getting help for meals.

## 2015-12-26 NOTE — Progress Notes (Signed)
Hospital Problem List     Principal Problem:   Pneumococcal pneumonia (HCC) Active Problems:   S/P kidney transplant   Diastolic CHF (HCC)   CAD S/P remote RCA PCI   Hypertension   Elevated troponin   Bacteremia   CAP (community acquired pneumonia)   Pleural effusion on right   CKD (chronic kidney disease) stage 3, GFR 30-59 ml/min   PAF (paroxysmal atrial fibrillation) (HCC)   Tachyarrhythmia    Patient Profile:   Primary Cardiologist: Dr. Rennis Golden  82 y/o ? with a h/o CAD, HTN, DM II, and CKD s/p prior renal tx in 2003, who presented to the ED today with a 5 day h/o progressive fatigue, wkns, diaphoresis, and subsequent development of c/p and dyspnea - found to be in [facility]  Subjective   Denies any pain overnight. Seen in Nuclear Medicine for 1-day NST.  Inpatient Medications    . albuterol  2.5 mg Nebulization BID  . allopurinol  100 mg Oral QHS  . antiseptic oral rinse  7 mL Mouth Rinse BID  . aspirin EC  81 mg Oral Daily  . atorvastatin  10 mg Oral q1800  . cefTRIAXone (ROCEPHIN)  IV  2 g Intravenous Q24H  . cinacalcet  30 mg Oral Q supper  . diltiazem  240 mg Oral Daily  . famotidine  20 mg Oral Daily  . furosemide  80 mg Oral Daily  . guaiFENesin  1,200 mg Oral BID  . insulin aspart  0-15 Units Subcutaneous TID WC  . metoprolol  100 mg Oral BID  . mycophenolate  500 mg Oral BID  . sodium chloride flush  3 mL Intravenous Q12H  . tacrolimus  1.5 mg Oral QHS  . tacrolimus  1.5 mg Oral q morning - 10a    Vital Signs    Filed Vitals:   12/25/15 1225 12/25/15 2004 12/25/15 2100 12/26/15 0501  BP: 127/82  116/48 117/61  Pulse: 82  69 52  Temp: 98.1 F (36.7 C)  98.7 F (37.1 C) 97.7 F (36.5 C)  TempSrc: Oral  Oral Oral  Resp: 18  18 20   Height:      Weight:    165 lb 3.2 oz (74.934 kg)  SpO2: 98% 99% 98% 94%    Intake/Output Summary (Last 24 hours) at 12/26/15 1006 Last data filed at 12/26/15 0913  Gross per 24 hour  Intake 598.75 ml  Output    1000 ml  Net -401.25 ml   Filed Weights   12/24/15 0545 12/25/15 0500 12/26/15 0501  Weight: 167 lb 3.2 oz (75.841 kg) 164 lb (74.39 kg) 165 lb 3.2 oz (74.934 kg)    Physical Exam    General: Well developed, well nourished, female appearing in no acute distress. Head: Normocephalic, atraumatic.  Neck: Supple without bruits, JVD not elevated. Lungs:  Resp regular and unlabored, rhonchi at bases. Heart: RRR, S1, S2, no S3, S4, or murmur; no rub. Abdomen: Soft, non-tender, non-distended with normoactive bowel sounds. No hepatomegaly. No rebound/guarding. No obvious abdominal masses. Extremities: No clubbing, cyanosis, or edema. Distal pedal pulses are 2+ bilaterally. Neuro: Alert and oriented X 3. Moves all extremities spontaneously. Psych: Normal affect.  Labs    CBC  Recent Labs  12/24/15 0320 12/25/15 0534  WBC 12.2* 8.3  HGB 11.2* 11.3*  HCT 34.4* 35.8*  MCV 86.4 87.5  PLT 305 306   Basic Metabolic Panel  Recent Labs  12/24/15 0320 12/25/15 0534  NA 129* 134*  K  3.8 4.4  CL 97* 101  CO2 19* 23  GLUCOSE 126* 110*  BUN 69* 61*  CREATININE 1.96* 1.72*  CALCIUM 9.6 9.9   Liver Function Tests  Recent Labs  12/24/15 1225 12/25/15 0534  AST  --  16  ALT  --  9*  ALKPHOS  --  86  BILITOT  --  0.4  PROT 6.2* 5.9*  ALBUMIN  --  2.6*    Telemetry    Not reviewed, seen in Nuclear Medicine.  ECG    No new tracings.   Cardiac Studies and Radiology    Dg Chest 2 View: 12/26/2015  CLINICAL DATA:  SOB WITH EXERTION, PLEURAL EFFUSION,HX DM,CAD,KIDNEY TRANSPLANT EXAM: CHEST  2 VIEW COMPARISON:  12/25/2015 FINDINGS: Heart is enlarged. There has been some improvement in aeration of right lung base. Small right pleural effusion persists. Left lung is clear. IMPRESSION: 1. Improved aeration at the right lung base. 2. Right pleural effusion. 3. Cardiomegaly. Electronically Signed   By: Norva PavlovElizabeth  Brown [name]   On: 12/26/2015 08:59   Ct Chest Wo Contrast: 12/23/2015   CLINICAL DATA:  Shortness of breath. Pleural effusion. Congestive heart failure and diabetes. EXAM: CT CHEST WITHOUT CONTRAST TECHNIQUE: Multidetector CT imaging of the chest was performed following the standard protocol without IV contrast. COMPARISON:  Chest radiograph dated 12/23/2015 FINDINGS: Mediastinum/Lymph Nodes: No masses or pathologically enlarged lymph nodes identified on this un-enhanced exam. The heart is enlarged. There is a small pericardial effusion. Atherosclerotic disease of the coronary arteries and thoracic aorta is noted. Layering material fills the esophagus dependently. Lungs/Pleura: There is a moderate to large water density right pleural effusion. There is right lower lobe airspace consolidation versus lobar atelectasis. There is patchy airspace consolidation in the basilar segments of the left lower lobe. Upper abdomen: No acute findings. Musculoskeletal: No chest wall mass or suspicious bone lesions identified. IMPRESSION: Large right pleural effusion with right lower lobe collapse. Patchy airspace disease within the dependent segments of the left lower lobe. Heterogeneous material filling the distal 2/3 of the esophagus. These findings raise the possibility of aspiration pneumonia. Endobronchial lesion causing collapse of the right lower lobe is also in the differential diagnosis, although one is not visualized on this nonenhanced exam. Alternatively the large right pleural effusion may be causing secondary compressive atelectasis of the right lower lobe. Enlarged cardiac silhouette. Atherosclerotic disease of the coronary arteries and aorta. Electronically Signed   By: Ted Mcalpineobrinka  Dimitrova [name]   On: 12/23/2015 14:41   Dg Chest Port 1 View: 12/25/2015  CLINICAL DATA:  Pleural effusion.  Thoracentesis. EXAM: PORTABLE CHEST 1 VIEW COMPARISON:  12/24/2015.  CT 12/23/2015. FINDINGS: Mediastinum and hilar structures are stable. Stable cardiomegaly. Continued re-expansion of right base  atelectasis. Right base infiltrate present. Small right pleural effusion. No pneumothorax. IMPRESSION: 1. Continued re-expansion of right base atelectasis. Right base infiltrates present. Small right pleural effusion. 2. Stable cardiomegaly.  No pulmonary venous congestion . Electronically Signed   By: Maisie Fushomas  Register   On: 12/25/2015 07:42    Echocardiogram: 12/19/2015 Study Conclusions - Left ventricle: The cavity size was normal. Wall thickness was  increased in a pattern of severe LVH. Systolic function was  normal. The estimated ejection fraction was in the range of 55%  to 60%. Wall motion was normal; there were no regional wall  motion abnormalities. - Mitral valve: There was mild regurgitation. - Left atrium: The atrium was moderately dilated. - Tricuspid valve: There was moderate regurgitation. - Pulmonary arteries: Systolic  pressure was moderately increased.  PA peak pressure: 44 mm Hg (S).  Impressions: - Normal LV systolic function; severe LVH; moderate LAE; mild MR;  moderate TR; moderately elevated pulmonary pressure.  Assessment & Plan    1. Afib with RVR - converted to NSR on rate control. - CHA2DS2VASc = 7. Currently on Heparin. Plan to add NOAC once ischemic workup is negative prior to discharge, note current Cr still elevated at 1.7  2. NSTEMI/ CAD - s/p RCA BMS in 2002 - residual LAD and Diag dzs - Initially felt she needs cath, later per Dr. Allyson Sabal, suspect demand ischemia from rapid afib and PNA, given renal transplant h/o and risk of nephrotoxicity of contrast dye, will initially plan for Myoview. - seen in Nuclear Medicine for 1-day NST. Official read pending by [facility].   3. R pleural effusion - s/p thoracentesis with clear fluid removed.  4. Acute on Chronic diastolic HF - IV Lasix has been switched to Lasix  PO daily. - continue to monitor renal function closely.  5. HTN - well controlled - continue current medication  regimen  6. HLD - continue statin therapy  7. DM II - per admitting team  8. Possible PNA - Cough and intermittent diaphoresis with persistent fatigue since 5/14. CXR with left base infiltrate. Blood culture positive for strept pneum.   Signed, Ellsworth Lennox , PA-C 10:06 AM 12/26/2015 Pager: [phone]  Agree with note by Reita May  Feeling clinically improved since thoracentesis. MV low risk/ nonischemic today. Nl EF by 2D.  + trop prob secondary to demand ischemia from AF. Maintaining NSR. OK to transition to NOAC and DC iv hep. Scr still 1.7 On ATBX for Pneumococcal PNA (10 days per PCCM). Nothing further to add. Will S/O. F/U with Dr. Rennis Golden after D/C.   Runell Gess, [name], FACP, [facility], [facility] [address] [place], [place]  [address]  [phone] 12/26/2015 1:34 PM

## 2015-12-26 NOTE — Discharge Instructions (Signed)

## 2015-12-26 NOTE — Progress Notes (Signed)
Went to room to have pt use flutter valve, pt requesting bath and wants to do bath instead currently.  Pt sitting in chair, no distress noted.

## 2015-12-26 NOTE — Care Management Important Message (Signed)
Important Message  Patient Details  Name: [name] MRN: [id number] Date of Birth: August 19, 1931   Medicare Important Message Given:  Yes    Hanley HaysDowell, Sonali Wivell T, RN 12/26/2015, 1:43 PM

## 2015-12-26 NOTE — Progress Notes (Signed)
PULMONARY / CRITICAL CARE MEDICINE   Name: [name] MRN: [id number] DOB: June 20, 1932    ADMISSION DATE:  12/18/2015 CONSULTATION DATE:  12/20/2015  REFERRING MD:  Dr. Anne FuSkains  CHIEF COMPLAINT:  Short of breath  SUBJECTIVE: feels great on her Bday  VITAL SIGNS: BP 136/57 mmHg  Pulse 51  Temp(Src) 97.7 F (36.5 C) (Oral)  Resp 18  Ht 5\' 3"  (1.6 m)  Wt 74.934 kg (165 lb 3.2 oz)  BMI 29.27 kg/m2  SpO2 100%  INTAKE / OUTPUT: I/O last 3 completed shifts: In: 1030.8 [P.O.:480; I.V.:450.8; IV Piggyback:100] Out: 1850 [Urine:1850]  PHYSICAL EXAMINATION: General:  Elderly female in NAD, alert Neuro:  Normal strength, AAOx4 HEENT:  no stridor, jvd down Cardiovascular: s1 s2 RR irregular  Lungs:  Best air entry to date rt base, cta Abdomen:  Soft, non tender Musculoskeletal:  No edema Skin:  No rashes or lesions  LABS:  BMET  Recent Labs Lab 12/23/15 0241 12/24/15 0320 12/25/15 0534  NA 131* 129* 134*  K 3.6 3.8 4.4  CL 98* 97* 101  CO2 20* 19* 23  BUN 70* 69* 61*  CREATININE 1.29* 1.96* 1.72*  GLUCOSE 119* 126* 110*    Electrolytes  Recent Labs Lab 12/23/15 0241 12/24/15 0320 12/25/15 0534  CALCIUM 9.2 9.6 9.9    CBC  Recent Labs Lab 12/23/15 0241 12/24/15 0320 12/25/15 0534  WBC 13.4* 12.2* 8.3  HGB 11.4* 11.2* 11.3*  HCT 35.0* 34.4* 35.8*  PLT 280 305 306    Coag's  Recent Labs Lab 12/23/15 1607  INR 1.36    Sepsis Markers No results for input(s): LATICACIDVEN, PROCALCITON, O2SATVEN in the last 168 hours.  ABG  Recent Labs Lab 12/20/15 1109  PHART 7.474*  PCO2ART 25.3*  PO2ART 69.1*    Liver Enzymes  Recent Labs Lab 12/23/15 0241 12/25/15 0534  AST 18 16  ALT 11* 9*  ALKPHOS 89 86  BILITOT 0.4 0.4  ALBUMIN 2.8* 2.6*    Cardiac Enzymes No results for input(s): TROPONINI, PROBNP in the last 168 hours.  Glucose  Recent Labs Lab 12/25/15 0559 12/25/15 1116 12/25/15 1559 12/25/15 2141 12/26/15 0638  12/26/15 1212  GLUCAP 109* 172* 118* 143* 121* 131*    Imaging Dg Chest 2 View  12/26/2015  CLINICAL DATA:  SOB WITH EXERTION, PLEURAL EFFUSION,HX DM,CAD,KIDNEY TRANSPLANT EXAM: CHEST  2 VIEW COMPARISON:  12/25/2015 FINDINGS: Heart is enlarged. There has been some improvement in aeration of right lung base. Small right pleural effusion persists. Left lung is clear. IMPRESSION: 1. Improved aeration at the right lung base. 2. Right pleural effusion. 3. Cardiomegaly. Electronically Signed   By: Norva PavlovElizabeth  Brown [name]   On: 12/26/2015 08:59     STUDIES:  5/19 Echo >> EF 55 to 60%, severe LVH  CULTURES: 5/18 Blood >> Pneumococcus 5/22 Blood >> strep pneumoniae >> sens rocephin  5/24 Pleural Fluid   Protein ratio >> 0.5  LDH ratio >> 0.8   Culture >>   Cytology >>neg  ANTIBIOTICS: 5/18 Zithromax >> 5/18 5/18 Rocephin >>  SIGNIFICANT EVENTS: 5/18  Admit 5/21  Bedside US >> not enough fluid to tap 5/24  Thoracentesis >> 1.4L removed    DISCUSSION: 82 y/o female with pneumococcal PNA with strep pneumoniae bacteremia and acute hypoxic respiratory failure.  She also has A fib with RVR, NSTEMI from demand ischemia.  She has hx of Stage III CKD s/p renal transplant, DM, chronic diastolic CHF, HLD  ASSESSMENT / PLAN:  PULMONARY A:  Acute hypoxic respiratory failure 2nd to CAP. Large R pleural effusion - s/p thora on 5/24 with 1460 removed, clear yellow.  Exudate by LDH.  Suspect parapneumonic. ATX RLL resolved P:   Neg cytology Effusion resolved after thora and continued neg balance Continued neg balance per renal Is No further imaging needed Upright, ambulation  INFECTIOUS A:   Pneumococcal PNA with bacteremia. P:   Rocephin, complete 10 days total  Will sign off  Mcarthur Rossetti. Tyson Alias, MD, FACP [name]: [phone] [facility] 12/26/2015 1:05 PM

## 2015-12-26 NOTE — Progress Notes (Signed)
Patient ID: [name], female   DOB: Oct 31, 1931, 82 y.o.   MRN: [id number]   [facility] Progress Note   Assessment/ Plan:   1. AKI on CKD3T: Status post renal transplant in 2003 maintained on Tac/MMF with baseline creatinine between 1.0-1.4. Current acute on chronic renal failure suspected to be hemodynamically mediated from A. fib/RVR versus normotensive ATN with associated community acquired pneumonia. Labs unavailable from this morning-pending. Clinically she is doing well with fair urine output and without any concerning signs or symptoms. We'll check up on labs as they become available to make any modifications to therapy. On oral furosemide. 2. A. fib with RVR: She appears to have converted back to normal sinus rhythm and now from intravenous to oral diltiazem. She is on both diltiazem and metoprolol-may need adjustment of dosing for hypotension/bradycardia 3. NSTEMI with history of coronary artery disease: This is suspected to have been from demand ischemia but with history of CAD-needs further inquiry. Underwent nuclear stress test today.  4. Community acquired pneumonia/right pleural effusion: Optimizing diuretic therapy and on intravenous ceftriaxone. Status post thoracentesis with yielding about 1500 mL of fluid that appears to be transudative. 5. 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  Subjective:   84nd birthday today. Reports comfortable night and denies any active complaints--had nuclear stress test earlier   Objective:   BP 144/66 mmHg  Pulse 52  Temp(Src) 97.7 F (36.5 C) (Oral)  Resp 20  Ht 5\' 3"  (1.6 m)  Wt 74.934 kg (165 lb 3.2 oz)  BMI 29.27 kg/m2  SpO2 94%  Intake/Output Summary (Last 24 hours) at 12/26/15 1044 Last data filed at 12/26/15 0913  Gross per 24 hour  Intake 598.75 ml  Output   1000 ml  Net -401.25 ml   Weight change: 0.544 kg (1 lb 3.2 oz)  Physical Exam: Gen: Comfortably resting in bed  CVS:  Pulse regular bradycardia, S1 and S2 normal Resp: clear breath sounds bilaterally-no distant rales  Abd: Soft, flat, nontender Ext: Trace to 1+ lower extremity edema  Imaging: Dg Chest 2 View  12/26/2015  CLINICAL DATA:  SOB WITH EXERTION, PLEURAL EFFUSION,HX DM,CAD,KIDNEY TRANSPLANT EXAM: CHEST  2 VIEW COMPARISON:  12/25/2015 FINDINGS: Heart is enlarged. There has been some improvement in aeration of right lung base. Small right pleural effusion persists. Left lung is clear. IMPRESSION: 1. Improved aeration at the right lung base. 2. Right pleural effusion. 3. Cardiomegaly. Electronically Signed   By: Norva PavlovElizabeth  Brown [name]   On: 12/26/2015 08:59   Dg Chest Port 1 View  12/25/2015  CLINICAL DATA:  Pleural effusion.  Thoracentesis. EXAM: PORTABLE CHEST 1 VIEW COMPARISON:  12/24/2015.  CT 12/23/2015. FINDINGS: Mediastinum and hilar structures are stable. Stable cardiomegaly. Continued re-expansion of right base atelectasis. Right base infiltrate present. Small right pleural effusion. No pneumothorax. IMPRESSION: 1. Continued re-expansion of right base atelectasis. Right base infiltrates present. Small right pleural effusion. 2. Stable cardiomegaly.  No pulmonary venous congestion . Electronically Signed   By: Maisie Fushomas  Register   On: 12/25/2015 07:42   Dg Chest Port 1 View  12/24/2015  CLINICAL DATA:  Status post right thoracentesis EXAM: PORTABLE CHEST 1 VIEW COMPARISON:  Earlier today FINDINGS: Decreased right pleural effusion, trace to small currently. No pneumothorax or re-expansion edema. Patchy opacity in the right base along its is presumably incomplete resolution of atelectasis given findings of right middle and lower lobe collapse on chest CT 12/23/2015. The left lung is clear. Stable aortic contours. IMPRESSION: 1. No  acute finding after right thoracentesis. There is minimal residual fluid. 2. Partial re-expansion of the collapsed right middle and lower lobe seen on CT yesterday. Electronically  Signed   By: Marnee Spring [name]   On: 12/24/2015 13:59    Labs: BMET  Recent Labs Lab 12/20/15 0800 12/21/15 0252 12/22/15 0243 12/23/15 0241 12/24/15 0320 12/25/15 0534  NA 131* 129* 128* 131* 129* 134*  K 3.7 3.9 3.9 3.6 3.8 4.4  CL 96* 95* 94* 98* 97* 101  CO2 22 17* 19* 20* 19* 23  GLUCOSE 166* 147* 137* 119* 126* 110*  BUN 36* 47* 59* 70* 69* 61*  CREATININE 1.54* 2.05* 2.32* 1.29* 1.96* 1.72*  CALCIUM 9.9 9.0 8.9 9.2 9.6 9.9   CBC  Recent Labs Lab 12/22/15 0243 12/23/15 0241 12/24/15 0320 12/25/15 0534  WBC 13.2* 13.4* 12.2* 8.3  HGB 11.6* 11.4* 11.2* 11.3*  HCT 35.1* 35.0* 34.4* 35.8*  MCV 85.8 86.4 86.4 87.5  PLT 309 280 305 306    Medications:    . albuterol  2.5 mg Nebulization BID  . allopurinol  100 mg Oral QHS  . antiseptic oral rinse  7 mL Mouth Rinse BID  . aspirin EC  81 mg Oral Daily  . atorvastatin  10 mg Oral q1800  . cefTRIAXone (ROCEPHIN)  IV  2 g Intravenous Q24H  . cinacalcet  30 mg Oral Q supper  . diltiazem  240 mg Oral Daily  . famotidine  20 mg Oral Daily  . furosemide  80 mg Oral Daily  . guaiFENesin  1,200 mg Oral BID  . insulin aspart  0-15 Units Subcutaneous TID WC  . metoprolol  100 mg Oral BID  . mycophenolate  500 mg Oral BID  . regadenoson      . sodium chloride flush  3 mL Intravenous Q12H  . tacrolimus  1.5 mg Oral QHS  . tacrolimus  1.5 mg Oral q morning - 10a   Zetta Bills, MD 12/26/2015, 10:44 AM

## 2015-12-27 LAB — CBC
HEMATOCRIT: 34.3 % — AB (ref 36.0–46.0)
HEMOGLOBIN: 10.7 g/dL — AB (ref 12.0–15.0)
MCH: 27.6 pg (ref 26.0–34.0)
MCHC: 31.2 g/dL (ref 30.0–36.0)
MCV: 88.6 fL (ref 78.0–100.0)
Platelets: 286 10*3/uL (ref 150–400)
RBC: 3.87 MIL/uL (ref 3.87–5.11)
RDW: 14.6 % (ref 11.5–15.5)
WBC: 7.6 10*3/uL (ref 4.0–10.5)

## 2015-12-27 LAB — COMPREHENSIVE METABOLIC PANEL
ALT: 10 U/L — ABNORMAL LOW (ref 14–54)
ANION GAP: 11 (ref 5–15)
AST: 16 U/L (ref 15–41)
Albumin: 2.6 g/dL — ABNORMAL LOW (ref 3.5–5.0)
Alkaline Phosphatase: 79 U/L (ref 38–126)
BILIRUBIN TOTAL: 0.5 mg/dL (ref 0.3–1.2)
BUN: 49 mg/dL — AB (ref 6–20)
CO2: 22 mmol/L (ref 22–32)
Calcium: 9.4 mg/dL (ref 8.9–10.3)
Chloride: 101 mmol/L (ref 101–111)
Creatinine, Ser: 1.64 mg/dL — ABNORMAL HIGH (ref 0.44–1.00)
GFR, EST AFRICAN AMERICAN: 33 mL/min — AB (ref >60)
GFR, EST NON AFRICAN AMERICAN: 28 mL/min — AB (ref >60)
Glucose, Bld: 123 mg/dL — ABNORMAL HIGH (ref 65–99)
POTASSIUM: 4.2 mmol/L (ref 3.5–5.1)
Sodium: 134 mmol/L — ABNORMAL LOW (ref 135–145)
TOTAL PROTEIN: 5.4 g/dL — AB (ref 6.5–8.1)

## 2015-12-27 LAB — GLUCOSE, CAPILLARY
GLUCOSE-CAPILLARY: 171 mg/dL — AB (ref 65–99)
GLUCOSE-CAPILLARY: 95 mg/dL (ref 65–99)
Glucose-Capillary: 174 mg/dL — ABNORMAL HIGH (ref 65–99)

## 2015-12-27 LAB — CULTURE, BLOOD (ROUTINE X 2)
CULTURE: NO GROWTH
Culture: NO GROWTH

## 2015-12-27 MED ORDER — TACROLIMUS 1 MG PO CAPS
1.0000 mg | ORAL_CAPSULE | Freq: Every day | ORAL | Status: DC
  Administered 2015-12-27 – 2015-12-31 (×5): 1 mg via ORAL
  Filled 2015-12-27 (×5): qty 1

## 2015-12-27 NOTE — Progress Notes (Addendum)
Phase l Cardiac Rehab   2 attempts were made to assist patient with ambulation, she refused both.  States she had a bad night and did not sleep .  Encouraged to walk with nursing staff tonight.  Referred to phase ll cardiac rehab, not sure if she is physically able to participate. [phone]

## 2015-12-27 NOTE — Progress Notes (Signed)
PROGRESS NOTE                                                                                                                                                                                                             Patient Demographics:    [name], is a 82 y.o. female, DOB - 06/10/1932, ZOX:[id number]  Admit date - 12/18/2015   Admitting Physician Kathleene Hazel, MD  Outpatient Primary MD for the patient is No primary care provider on file.  LOS - 9  Outpatient Specialists: Nephrology cardiology  Chief Complaint  Patient presents with  . Shortness of Breath  . Chest Pain       Brief Narrative   82 y.o. female with a history of CAD, HTN, DM2, CKD s/p renal transplant in 2003, admitted on 5/18 after presenting with shortness of breath and chest pain, found to have A fib with RVR (CHADVASC 7), as well as NSTEMI from demand ischemia. Also had a pneumococcal pneumonia  Hospital course complicated due to development of acute hypoxic respiratory failure with pneumococcal pneumonia. [facility] PCCM involving care. Nephrology consult and given worsened renal function. Patient transferred to hospitalist service for further care.Patient status post bedside thoracentesis on 5/24. Schedule for Lexiscan Myoview on 5/26    Subjective:   Complaining of shallow breathing however has not been out of bed or ambulated  much since admission   Assessment  & Plan :   Acute respiratory failure with hypoxia secondary to pneumococcal pneumonia/Strep pneumo bacteremia and pleural effusion Serial chest x-ray now shows continued reexpansion of the right base. Receiving empiric IV Rocephin (10 day course) started 5/18. To be  completed on 5/28. Continue nebulizers, Mucinex and pulmonary toilet.  PCCM  recommended CT scan that showed a large right pleural effusion with right lower lobe collapse and patchy airspace disease of the left lower  lobe, finding suspicion for aspiration pneumonia, endobronchial lesion causing collapse of the right lower lobe is also in the differential diagnosis s/p thoracentesis bedside on 5/24 with 1460 removed, clear yellow. Likely exudative by LDH, repeat chest x-ray shows reexpansion of the right base, follow pleural fluid culture from 5/24, no growth from blood culture from 5/22. Last positive blood culture was on 5/18    Acute on Chronic kidney disease stage III-baseline around 1.4  Continue Prograf. Creatinine 2.32>1.64 suspected  to hemodynamically mediated with atrial fibrillation/rapid ventricular response vs ATN, labs pending from this morning Status post renal transplant in 2003 maintained on Tac/MMF   Continue to monitor Prograf levels, currently on Lasix 80 mg a day   A. fib with RVR,CHA2DS2VASc = 7 Required Cardizem drip on admission. Now stable on oral Cardizem and metoprolol. 2-D echo showed normal EF. now in sinus rhythm. Treated with IV heparin and subsequently switched to Eliquis      NSTE MI,s/p RCA BMS in 2002 - residual LAD and Diag dzs Likely secondary to demand ischemia with rapid A. fib/pneumonia. given renal transplant h/o and risk of nephrotoxicity of contrast dye, decided to do Myoview instead Lexiscan Myoview 5/26 -showed inferior wall infarct with no evidence of reversible ischemia and EF of 52%   Acute on Chronic Diastolic CHF: BNP was abnormal at 1200 on admit. . Renal function fluctuating. She was on 80 mg PO lasix as an outpatient.  Currently Lasix on hold, follow BMP closely.   hold ARB. Strict I/Os.    diabetes mellitus type II-stable  Continue sliding scale coverage. Hemoglobin A1c 6.9   physical deconditioning PT recommended home health-ordered   Code Status: full code Family Communication  :None at bedside Disposition Plan  : Completion of antibiotics and then discharge with home health on 5/28     Consults  :  Cardiology PCCM Renal   Procedures   :  2-D echo  DVT Prophylaxis  :  IV heparin  Lab Results  Component Value Date   PLT 286 12/27/2015    Antibiotics  :   Rocephin 5/18- Azithromycin 5/18-5/19      Objective:   Filed Vitals:   12/26/15 1214 12/26/15 2034 12/26/15 2140 12/27/15 0618  BP: 136/57  106/57 107/70  Pulse: 51  51 61  Temp:   97.5 F (36.4 C) 97.5 F (36.4 C)  TempSrc:   Oral Oral  Resp: 18  18 18   Height:      Weight:    74.98 kg (165 lb 4.8 oz)  SpO2: 100% 99% 100% 95%    Wt Readings from Last 3 Encounters:  12/27/15 74.98 kg (165 lb 4.8 oz)  08/17/14 70.126 kg (154 lb 9.6 oz)  03/20/14 75.501 kg (166 lb 7.2 oz)     Intake/Output Summary (Last 24 hours) at 12/27/15 0846 Last data filed at 12/27/15 1610  Gross per 24 hour  Intake    830 ml  Output    800 ml  Net     30 ml     Physical Exam  Gen: not in distress, Appears fatigued  HEENT: no pallor, moist mucosa, supple neck chest: diminished right-sided breath sounds CVS: N S1&S2, no murmurs, rubs or gallop GI: soft, NT, ND, BS+ Musculoskeletal: warm, no edema CNS: AAOX3, non focal    Data Review:    CBC  Recent Labs Lab 12/22/15 0243 12/23/15 0241 12/24/15 0320 12/25/15 0534 12/27/15 0307  WBC 13.2* 13.4* 12.2* 8.3 7.6  HGB 11.6* 11.4* 11.2* 11.3* 10.7*  HCT 35.1* 35.0* 34.4* 35.8* 34.3*  PLT 309 280 305 306 286  MCV 85.8 86.4 86.4 87.5 88.6  MCH 28.4 28.1 28.1 27.6 27.6  MCHC 33.0 32.6 32.6 31.6 31.2  RDW 13.8 13.9 13.8 14.0 14.6    Chemistries   Recent Labs Lab 12/23/15 0241 12/24/15 0320 12/25/15 0534 12/26/15 1424 12/27/15 0307  NA 131* 129* 134* 134* 134*  K 3.6 3.8 4.4 3.8 4.2  CL 98* 97* 101  102 101  CO2 20* 19* GLUCOSE 119* 126* 110* 104* 123*  BUN 70* 69* 61* 49* 49*  CREATININE 1.29* 1.96* 1.72* 1.53* 1.64*  CALCIUM 9.2 9.6 9.9 9.6 9.4  AST 18  --  ALT 11*  --  9* 11* 10*  ALKPHOS 89  --  86 87 79  BILITOT 0.4  --  0.4 0.3 0.5    ------------------------------------------------------------------------------------------------------------------ No results for input(s): CHOL, HDL, LDLCALC, TRIG, CHOLHDL, LDLDIRECT in the last 72 hours.  Lab Results  Component Value Date   HGBA1C 6.9* 12/18/2015   ------------------------------------------------------------------------------------------------------------------ No results for input(s): TSH, T4TOTAL, T3FREE, THYROIDAB in the last 72 hours.  Invalid input(s): FREET3 ------------------------------------------------------------------------------------------------------------------ No results for input(s): VITAMINB12, FOLATE, FERRITIN, TIBC, IRON, RETICCTPCT in the last 72 hours.  Coagulation profile  Recent Labs Lab 12/23/15 1607  INR 1.36    No results for input(s): DDIMER in the last 72 hours.  Cardiac Enzymes No results for input(s): CKMB, TROPONINI, MYOGLOBIN in the last 168 hours.  Invalid input(s): CK ------------------------------------------------------------------------------------------------------------------    Component Value Date/Time   BNP 1232.3* 12/18/2015 0830    Inpatient Medications  Scheduled Meds: . albuterol  2.5 mg Nebulization BID  . allopurinol  100 mg Oral QHS  . antiseptic oral rinse  7 mL Mouth Rinse BID  . apixaban  2.5 mg Oral BID  . aspirin EC  81 mg Oral Daily  . atorvastatin  10 mg Oral q1800  . cinacalcet  30 mg Oral Q supper  . diltiazem  240 mg Oral Daily  . famotidine  20 mg Oral Daily  . furosemide  80 mg Oral Daily  . guaiFENesin  1,200 mg Oral BID  . insulin aspart  0-15 Units Subcutaneous TID WC  . metoprolol  100 mg Oral BID  . mycophenolate  500 mg Oral BID  . sodium chloride flush  3 mL Intravenous Q12H  . tacrolimus  1.5 mg Oral QHS  . tacrolimus  1.5 mg Oral q morning - 10a   Continuous Infusions: . sodium chloride 10 mL/hr (12/24/15 0732)   PRN Meds:.sodium chloride, acetaminophen, albuterol,  nitroGLYCERIN, ondansetron (ZOFRAN) IV, senna-docusate, sodium chloride flush, traMADol  Micro Results Recent Results (from the past 240 hour(s))  Culture, blood (Routine X 2) w Reflex to ID Panel     Status: Abnormal   Collection Time: 12/18/15 10:49 AM  Result Value Ref Range Status   Specimen Description BLOOD RIGHT HAND  Final   Special Requests BOTTLES DRAWN AEROBIC AND ANAEROBIC  5CC  Final   Culture  Setup Time   Final    GRAM POSITIVE COCCI IN CHAINS IN PAIRS IN BOTH AEROBIC AND ANAEROBIC BOTTLES Organism ID to follow CRITICAL RESULT CALLED TO, READ BACK BY AND VERIFIED WITH: CARON AMEND,PHARMD@0050  12/19/15 MKELLY    Culture STREPTOCOCCUS PNEUMONIAE (A)  Final   Report Status 12/21/2015 FINAL  Final   Organism ID, Bacteria STREPTOCOCCUS PNEUMONIAE  Final      Susceptibility   Streptococcus pneumoniae - MIC*    ERYTHROMYCIN 2 RESISTANT Resistant     LEVOFLOXACIN 0.5 SENSITIVE Sensitive     VANCOMYCIN 0.5 SENSITIVE Sensitive     PENICILLIN 0.25 SENSITIVE Sensitive     CEFTRIAXONE 0.25 SENSITIVE Sensitive     * STREPTOCOCCUS PNEUMONIAE  Blood Culture ID Panel (Reflexed)     Status: Abnormal   Collection Time: 12/18/15 10:49 AM  Result Value Ref Range Status   Enterococcus species NOT DETECTED  NOT DETECTED Final   Vancomycin resistance NOT DETECTED NOT DETECTED Final   Listeria monocytogenes NOT DETECTED NOT DETECTED Final   Staphylococcus species NOT DETECTED NOT DETECTED Final   Staphylococcus aureus NOT DETECTED NOT DETECTED Final   Methicillin resistance NOT DETECTED NOT DETECTED Final   Streptococcus species NOT DETECTED NOT DETECTED Final   Streptococcus agalactiae NOT DETECTED NOT DETECTED Final   Streptococcus pneumoniae DETECTED (A) NOT DETECTED Final    Comment: CRITICAL RESULT CALLED TO, READ BACK BY AND VERIFIED WITH: CARON AMEND,PHARMD @0050  12/19/15 MKELLY    Streptococcus pyogenes NOT DETECTED NOT DETECTED Final   Acinetobacter baumannii NOT DETECTED NOT  DETECTED Final   Enterobacteriaceae species NOT DETECTED NOT DETECTED Final   Enterobacter cloacae complex NOT DETECTED NOT DETECTED Final   Escherichia coli NOT DETECTED NOT DETECTED Final   Klebsiella oxytoca NOT DETECTED NOT DETECTED Final   Klebsiella pneumoniae NOT DETECTED NOT DETECTED Final   Proteus species NOT DETECTED NOT DETECTED Final   Serratia marcescens NOT DETECTED NOT DETECTED Final   Carbapenem resistance NOT DETECTED NOT DETECTED Final   Haemophilus influenzae NOT DETECTED NOT DETECTED Final   Neisseria meningitidis NOT DETECTED NOT DETECTED Final   Pseudomonas aeruginosa NOT DETECTED NOT DETECTED Final   Candida albicans NOT DETECTED NOT DETECTED Final   Candida glabrata NOT DETECTED NOT DETECTED Final   Candida krusei NOT DETECTED NOT DETECTED Final   Candida parapsilosis NOT DETECTED NOT DETECTED Final   Candida tropicalis NOT DETECTED NOT DETECTED Final  Culture, blood (Routine X 2) w Reflex to ID Panel     Status: Abnormal   Collection Time: 12/18/15 10:59 AM  Result Value Ref Range Status   Specimen Description BLOOD LEFT ANTECUBITAL  Final   Special Requests BOTTLES DRAWN AEROBIC AND ANAEROBIC  5CC  Final   Culture  Setup Time   Final    GRAM POSITIVE COCCI IN PAIRS IN CHAINS IN BOTH AEROBIC AND ANAEROBIC BOTTLES CRITICAL RESULT CALLED TO, READ BACK BY AND VERIFIED WITH: CARON AMEND,PHARMD @0050  12/19/15 KELLYM    Culture (A)  Final    STREPTOCOCCUS PNEUMONIAE SUSCEPTIBILITIES PERFORMED ON PREVIOUS CULTURE WITHIN THE LAST 5 DAYS.    Report Status 12/21/2015 FINAL  Final  MRSA PCR Screening     Status: Abnormal   Collection Time: 12/18/15  9:20 PM  Result Value Ref Range Status   MRSA by PCR POSITIVE (A) NEGATIVE Final    Comment:        The GeneXpert MRSA Assay (FDA approved for NASAL specimens only), is one component of a comprehensive MRSA colonization surveillance program. It is not intended to diagnose MRSA infection nor to guide or monitor  treatment for MRSA infections. RESULT CALLED TO, READ BACK BY AND VERIFIED WITH: S ROUSE,RN @0302  12/19/15 MKELLY   Culture, blood (Routine X 2) w Reflex to ID Panel     Status: None (Preliminary result)   Collection Time: 12/22/15 10:58 AM  Result Value Ref Range Status   Specimen Description BLOOD RIGHT HAND  Final   Special Requests IN PEDIATRIC BOTTLE  3CC  Final   Culture NO GROWTH 4 DAYS  Final   Report Status PENDING  Incomplete  Culture, blood (Routine X 2) w Reflex to ID Panel     Status: None (Preliminary result)   Collection Time: 12/22/15 11:00 AM  Result Value Ref Range Status   Specimen Description BLOOD RIGHT HAND  Final   Special Requests IN PEDIATRIC BOTTLE 3.0CC  Final  Culture NO GROWTH 4 DAYS  Final   Report Status PENDING  Incomplete  Gram stain     Status: None   Collection Time: 12/24/15  9:01 PM  Result Value Ref Range Status   Specimen Description PLEURAL  Final   Special Requests Immunocompromised  Final   Gram Stain   Final    CYTOSPIN SMEAR WBC PRESENT,BOTH PMN AND MONONUCLEAR NO ORGANISMS SEEN    Report Status 12/25/2015 FINAL  Final  Culture, body fluid-bottle     Status: None (Preliminary result)   Collection Time: 12/24/15  9:01 PM  Result Value Ref Range Status   Specimen Description PLEURAL  Final   Special Requests AEROBIC BOTTLE ONLY  Final   Culture NO GROWTH 1 DAY  Final   Report Status PENDING  Incomplete    Radiology Reports Dg Chest 2 View  12/26/2015  CLINICAL DATA:  SOB WITH EXERTION, PLEURAL EFFUSION,HX DM,CAD,KIDNEY TRANSPLANT EXAM: CHEST  2 VIEW COMPARISON:  12/25/2015 FINDINGS: Heart is enlarged. There has been some improvement in aeration of right lung base. Small right pleural effusion persists. Left lung is clear. IMPRESSION: 1. Improved aeration at the right lung base. 2. Right pleural effusion. 3. Cardiomegaly. Electronically Signed   By: Norva Pavlov [name]   On: 12/26/2015 08:59   Ct Chest Wo Contrast  12/23/2015   CLINICAL DATA:  Shortness of breath. Pleural effusion. Congestive heart failure and diabetes. EXAM: CT CHEST WITHOUT CONTRAST TECHNIQUE: Multidetector CT imaging of the chest was performed following the standard protocol without IV contrast. COMPARISON:  Chest radiograph dated 12/23/2015 FINDINGS: Mediastinum/Lymph Nodes: No masses or pathologically enlarged lymph nodes identified on this un-enhanced exam. The heart is enlarged. There is a small pericardial effusion. Atherosclerotic disease of the coronary arteries and thoracic aorta is noted. Layering material fills the esophagus dependently. Lungs/Pleura: There is a moderate to large water density right pleural effusion. There is right lower lobe airspace consolidation versus lobar atelectasis. There is patchy airspace consolidation in the basilar segments of the left lower lobe. Upper abdomen: No acute findings. Musculoskeletal: No chest wall mass or suspicious bone lesions identified. IMPRESSION: Large right pleural effusion with right lower lobe collapse. Patchy airspace disease within the dependent segments of the left lower lobe. Heterogeneous material filling the distal 2/3 of the esophagus. These findings raise the possibility of aspiration pneumonia. Endobronchial lesion causing collapse of the right lower lobe is also in the differential diagnosis, although one is not visualized on this nonenhanced exam. Alternatively the large right pleural effusion may be causing secondary compressive atelectasis of the right lower lobe. Enlarged cardiac silhouette. Atherosclerotic disease of the coronary arteries and aorta. Electronically Signed   By: Ted Mcalpine [name]   On: 12/23/2015 14:41   Nm Myocar Multi W/spect W/wall Motion / Ef  12/26/2015  CLINICAL DATA:  Coronary artery disease, CHF, hypertension, diabetes, chest pain, shortness of Breath EXAM: MYOCARDIAL IMAGING WITH SPECT (REST AND PHARMACOLOGIC-STRESS) GATED LEFT VENTRICULAR WALL MOTION STUDY  LEFT VENTRICULAR EJECTION FRACTION TECHNIQUE: Standard myocardial SPECT imaging was performed after resting intravenous injection of 10 mCi Tc-65m sestamibi. Subsequently, intravenous infusion of Lexiscan was performed under the supervision of the Cardiology staff. At peak effect of the drug, 30 mCi Tc-70m sestamibi was injected intravenously and standard myocardial SPECT imaging was performed. Quantitative gated imaging was also performed to evaluate left ventricular wall motion, and estimate left ventricular ejection fraction. COMPARISON:  None. FINDINGS: Perfusion: There is a small area of moderately decreased activity in the inferior  wall of the left ventricular myocardium on both the resting and stress images. No suggestion of ischemia. Wall Motion: No left ventricular enlargement. There is decreased myocardial thickening in wall motion involving the septum and inferior wall of the left ventricle. Left Ventricular Ejection Fraction: 52 % End diastolic volume 94 ml End systolic volume 45 ml IMPRESSION: 1. Probable inferior wall infarct.  No evidence of ischemia. 2. Mild inferior wall and septal hypokinesis. 3. Left ventricular ejection fraction 52% 4. Low-risk stress test findings*. *2012 Appropriate Use Criteria for Coronary Revascularization Focused Update: J Am Coll Cardiol. 2012;59(9):857-881. http://content.dementiazones.com.aspx?articleid=[email or web address] Electronically Signed   By: Corlis Leak [name]   On: 12/26/2015 16:52   Dg Chest Port 1 View  12/25/2015  CLINICAL DATA:  Pleural effusion.  Thoracentesis. EXAM: PORTABLE CHEST 1 VIEW COMPARISON:  12/24/2015.  CT 12/23/2015. FINDINGS: Mediastinum and hilar structures are stable. Stable cardiomegaly. Continued re-expansion of right base atelectasis. Right base infiltrate present. Small right pleural effusion. No pneumothorax. IMPRESSION: 1. Continued re-expansion of right base atelectasis. Right base infiltrates present. Small right pleural effusion. 2. Stable  cardiomegaly.  No pulmonary venous congestion . Electronically Signed   By: Maisie Fus  Register   On: 12/25/2015 07:42   Dg Chest Port 1 View  12/24/2015  CLINICAL DATA:  Status post right thoracentesis EXAM: PORTABLE CHEST 1 VIEW COMPARISON:  Earlier today FINDINGS: Decreased right pleural effusion, trace to small currently. No pneumothorax or re-expansion edema. Patchy opacity in the right base along its is presumably incomplete resolution of atelectasis given findings of right middle and lower lobe collapse on chest CT 12/23/2015. The left lung is clear. Stable aortic contours. IMPRESSION: 1. No acute finding after right thoracentesis. There is minimal residual fluid. 2. Partial re-expansion of the collapsed right middle and lower lobe seen on CT yesterday. Electronically Signed   By: Marnee Spring [name]   On: 12/24/2015 13:59   Dg Chest Port 1 View  12/24/2015  CLINICAL DATA:  Shortness of breath.  Pleural effusion . EXAM: PORTABLE CHEST 1 VIEW COMPARISON:  CT 12/23/2015.  Chest x-ray 12/23/2015. FINDINGS: Mediastinum hilar structures are stable. Stable cardiomegaly. Stable right lower lobe atelectasis and/or consolidation and right pleural effusion. Left lung is clear. No pneumothorax. IMPRESSION: 1. Stable right lower lobe atelectasis and/or consolidation and right pleural effusion. 2. Stable cardiomegaly. Electronically Signed   By: Maisie Fus  Register   On: 12/24/2015 07:11   Dg Chest Port 1 View  12/23/2015  CLINICAL DATA:  Pneumonia. EXAM: PORTABLE CHEST 1 VIEW COMPARISON:  12/22/2015. FINDINGS: Stable cardiomegaly. Progressive infiltrate right lower lobe. Progressive right pleural effusion. Mild left base subsegmental atelectasis and or infiltrate. Left hemidiaphragm not imaged. No pneumothorax. IMPRESSION: 1. Progressive right lower lobe infiltrate and right pleural effusion. 2.  Left base subsegmental atelectasis and or mild infiltrate. 3.  Cardiomegaly.  No pulmonary venous congestion .  Electronically Signed   By: Maisie Fus  Register   On: 12/23/2015 07:18   Dg Chest Port 1 View  12/22/2015  CLINICAL DATA:  Continued assessment of pleural effusion. EXAM: PORTABLE CHEST 1 VIEW COMPARISON:  12/21/2015. FINDINGS: Cardiomegaly. Large RIGHT pleural effusion is increased. Compressive atelectasis at the RIGHT base. Trace LEFT effusion may be improved. Overall improved vascular congestion. IMPRESSION: Increasing RIGHT pleural effusion otherwise improved exam. Electronically Signed   By: Elsie Stain [name]   On: 12/22/2015 07:27   Dg Chest Port 1 View  12/21/2015  CLINICAL DATA:  Community-acquired pneumonia EXAM: PORTABLE CHEST 1 VIEW COMPARISON:  Chest radiograph from  one day prior. FINDINGS: Stable cardiomediastinal silhouette with mild cardiomegaly. No pneumothorax. Moderate right and small left pleural effusions, increased bilaterally. Mild pulmonary edema, increased. Patchy bibasilar lung consolidation, right greater than left, slightly worsened bilaterally. IMPRESSION: 1. Patchy bibasilar lung consolidation, right greater than left, slightly worsened bilaterally, likely a combination of atelectasis and pneumonia. 2. Stable mild cardiomegaly with increased mild pulmonary edema. 3. Moderate right and small left pleural effusions, increased bilaterally. Electronically Signed   By: Delbert Phenix [name]   On: 12/21/2015 08:39   Dg Chest Port 1 View  12/20/2015  CLINICAL DATA:  Shortness of breath EXAM: PORTABLE CHEST 1 VIEW COMPARISON:  12/18/2015 FINDINGS: New right lower lung opacity, likely combination of atelectasis/ pneumonia and layering pleural effusion. Mild left basilar opacity, possibly atelectasis. Cardiomegaly.  No frank interstitial edema. No pneumothorax. IMPRESSION: New right lower lung opacity, likely combination of atelectasis/ pneumonia and layering pleural effusion. Mild left basilar opacity, possibly atelectasis. Electronically Signed   By: Charline Bills [name]   On: 12/20/2015  11:53   Dg Chest Port 1 View  12/18/2015  CLINICAL DATA:  Productive cough for 2 weeks, shortness of breath, headache EXAM: PORTABLE CHEST 1 VIEW COMPARISON:  08/11/2014 FINDINGS: Borderline cardiomegaly. No pulmonary edema. There is streaky left base retrocardiac atelectasis or early infiltrate. Atherosclerotic but for sclerotic calcifications of thoracic aorta. IMPRESSION: Streaky left base retrocardiac atelectasis or early infiltrate. No pulmonary edema. Follow-up to resolution recommended. Electronically Signed   By: Natasha Mead [name]   On: 12/18/2015 09:38    Time Spent in minutes  35   Sonali Wivell M.D on 12/27/2015 at 8:46 AM  Between 7am to 7pm - Pager - [phone]  After 7pm go to [email or web address] - password [facility]  [facility] -  Office  [phone]

## 2015-12-27 NOTE — Progress Notes (Signed)
Patient ID: [name], female   DOB: 06-02-1932, 82 y.o.   MRN: [id number]  [facility] Progress Note   Assessment/ Plan:   1. AKI on CKD3T: Status post renal transplant in 2003 maintained on Tac/MMF with baseline creatinine between 1.0-1.4. Current acute on chronic renal failure suspected to be hemodynamically mediated from A. fib/RVR versus normotensive ATN with associated community acquired pneumonia. Appears to be fairly close to baseline with regards to creatinine-given marginally elevated Prograf level of 8 two days ago-will decrease tacrolimus dose to 1.5 mg every morning and 1 mg every afternoon repeat Prograf levels in 2 days. (Therapeutic goal 4-6). Clinically getting close to euvolemic. 2. A. fib with RVR: She appears to have converted back to normal sinus rhythm and now from intravenous to oral diltiazem. She is on both diltiazem and metoprolol-may need adjustment of dosing for hypotension/bradycardia 3. NSTEMI with history of coronary artery disease: This is suspected to have been from demand ischemia but with history of CAD-needs further inquiry. Negative nuclear stress test yesterday.  4. Community acquired pneumonia/right pleural effusion: On intravenous ceftriaxone-to complete 10 days total therapy. Status post thoracentesis with yielding about 1500 mL of fluid that appears to be transudative. Pulmonology has signed off. 5. 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  Subjective:   84nd birthday yesterday. Reports comfortable night and denies any active complaints.    Objective:   BP 107/70 mmHg  Pulse 61  Temp(Src) 97.5 F (36.4 C) (Oral)  Resp 18  Ht  (1.6 m)  Wt 74.98 kg (165 lb 4.8 oz)  BMI 29.29 kg/m2  SpO2 100%  Intake/Output Summary (Last 24 hours) at 12/27/15 1028 Last data filed at 12/27/15 1191  Gross per 24 hour  Intake    830 ml  Output    800 ml  Net     30 ml   Weight change: 0.045 kg (1.6  oz)  Physical Exam: Gen: Comfortably resting in bed  CVS: Pulse regular bradycardia, S1 and S2 normal Resp: clear breath sounds bilaterally-no distant rales  Abd: Soft, flat, nontender Ext: Trace lower extremity edema  Imaging: Dg Chest 2 View  12/26/2015  CLINICAL DATA:  SOB WITH EXERTION, PLEURAL EFFUSION,HX DM,CAD,KIDNEY TRANSPLANT EXAM: CHEST  2 VIEW COMPARISON:  12/25/2015 FINDINGS: Heart is enlarged. There has been some improvement in aeration of right lung base. Small right pleural effusion persists. Left lung is clear. IMPRESSION: 1. Improved aeration at the right lung base. 2. Right pleural effusion. 3. Cardiomegaly. Electronically Signed   By: Norva Pavlov [name]   On: 12/26/2015 08:59   Nm Myocar Multi W/spect W/wall Motion / Ef  12/26/2015  CLINICAL DATA:  Coronary artery disease, CHF, hypertension, diabetes, chest pain, shortness of Breath EXAM: MYOCARDIAL IMAGING WITH SPECT (REST AND PHARMACOLOGIC-STRESS) GATED LEFT VENTRICULAR WALL MOTION STUDY LEFT VENTRICULAR EJECTION FRACTION TECHNIQUE: Standard myocardial SPECT imaging was performed after resting intravenous injection of 10 mCi Tc-56m sestamibi. Subsequently, intravenous infusion of Lexiscan was performed under the supervision of the Cardiology staff. At peak effect of the drug, 30 mCi Tc-38m sestamibi was injected intravenously and standard myocardial SPECT imaging was performed. Quantitative gated imaging was also performed to evaluate left ventricular wall motion, and estimate left ventricular ejection fraction. COMPARISON:  None. FINDINGS: Perfusion: There is a small area of moderately decreased activity in the inferior wall of the left ventricular myocardium on both the resting and stress images. No suggestion of ischemia. Wall Motion: No left ventricular enlargement. There is  decreased myocardial thickening in wall motion involving the septum and inferior wall of the left ventricle. Left Ventricular Ejection Fraction: 52 %  End diastolic volume 94 ml End systolic volume 45 ml IMPRESSION: 1. Probable inferior wall infarct.  No evidence of ischemia. 2. Mild inferior wall and septal hypokinesis. 3. Left ventricular ejection fraction 52% 4. Low-risk stress test findings*. *2012 Appropriate Use Criteria for Coronary Revascularization Focused Update: J Am Coll Cardiol. 2012;59(9):857-881. http://content.dementiazones.comonlinejacc.org/article.aspx?articleid=1201161 Electronically Signed   By: Corlis Leak  Hassell [name]   On: 12/26/2015 16:52    Labs: BMET  Recent Labs Lab 12/21/15 0252 12/22/15 0243 12/23/15 0241 12/24/15 0320 12/25/15 0534 12/26/15 1424 12/27/15 0307  NA 129* 128* 131* 129* 134* 134* 134*  K 3.9 3.9 3.6 3.8 4.4 3.8 4.2  CL 95* 94* 98* 97* 101 102 101  CO2 17* 19* 20* 19* 23 22 22   GLUCOSE 147* 137* 119* 126* 110* 104* 123*  BUN 47* 59* 70* 69* 61* 49* 49*  CREATININE 2.05* 2.32* 1.29* 1.96* 1.72* 1.53* 1.64*  CALCIUM 9.0 8.9 9.2 9.6 9.9 9.6 9.4   CBC  Recent Labs Lab 12/23/15 0241 12/24/15 0320 12/25/15 0534 12/27/15 0307  WBC 13.4* 12.2* 8.3 7.6  HGB 11.4* 11.2* 11.3* 10.7*  HCT 35.0* 34.4* 35.8* 34.3*  MCV 86.4 86.4 87.5 88.6  PLT 280 305 306 286    Medications:    . albuterol  2.5 mg Nebulization BID  . allopurinol  100 mg Oral QHS  . antiseptic oral rinse  7 mL Mouth Rinse BID  . apixaban  2.5 mg Oral BID  . aspirin EC  81 mg Oral Daily  . atorvastatin  10 mg Oral q1800  . cinacalcet  30 mg Oral Q supper  . diltiazem  240 mg Oral Daily  . famotidine  20 mg Oral Daily  . furosemide  80 mg Oral Daily  . guaiFENesin  1,200 mg Oral BID  . insulin aspart  0-15 Units Subcutaneous TID WC  . metoprolol  100 mg Oral BID  . mycophenolate  500 mg Oral BID  . sodium chloride flush  3 mL Intravenous Q12H  . tacrolimus  1 mg Oral QHS  . tacrolimus  1.5 mg Oral q morning - 10a   Joy BillsJay Minnette Merida, MD 12/27/2015, 10:28 AM

## 2015-12-28 ENCOUNTER — Inpatient Hospital Stay (HOSPITAL_COMMUNITY): Payer: Medicare Other

## 2015-12-28 LAB — GLUCOSE, SEROUS FLUID: GLUCOSE FL: 123 mg/dL

## 2015-12-28 LAB — COMPREHENSIVE METABOLIC PANEL
ALBUMIN: 2.7 g/dL — AB (ref 3.5–5.0)
ALT: 10 U/L — AB (ref 14–54)
AST: 16 U/L (ref 15–41)
Alkaline Phosphatase: 83 U/L (ref 38–126)
Anion gap: 11 (ref 5–15)
BUN: 47 mg/dL — AB (ref 6–20)
CHLORIDE: 103 mmol/L (ref 101–111)
CO2: 22 mmol/L (ref 22–32)
CREATININE: 1.55 mg/dL — AB (ref 0.44–1.00)
Calcium: 9.4 mg/dL (ref 8.9–10.3)
GFR calc Af Amer: 35 mL/min — ABNORMAL LOW (ref >60)
GFR calc non Af Amer: 30 mL/min — ABNORMAL LOW (ref >60)
Glucose, Bld: 117 mg/dL — ABNORMAL HIGH (ref 65–99)
Potassium: 4.6 mmol/L (ref 3.5–5.1)
SODIUM: 136 mmol/L (ref 135–145)
Total Bilirubin: 0.6 mg/dL (ref 0.3–1.2)
Total Protein: 6 g/dL — ABNORMAL LOW (ref 6.5–8.1)

## 2015-12-28 LAB — GRAM STAIN

## 2015-12-28 LAB — IRON AND TIBC
Iron: 46 ug/dL (ref 28–170)
SATURATION RATIOS: 18 % (ref 10.4–31.8)
TIBC: 255 ug/dL (ref 250–450)
UIBC: 209 ug/dL

## 2015-12-28 LAB — LACTATE DEHYDROGENASE, PLEURAL OR PERITONEAL FLUID: LD, Fluid: 91 U/L — ABNORMAL HIGH (ref 3–23)

## 2015-12-28 LAB — BODY FLUID CELL COUNT WITH DIFFERENTIAL
Eos, Fluid: 0 %
LYMPHS FL: 45 %
MONOCYTE-MACROPHAGE-SEROUS FLUID: 49 % — AB (ref 50–90)
Neutrophil Count, Fluid: 6 % (ref 0–25)
Total Nucleated Cell Count, Fluid: 1305 cu mm — ABNORMAL HIGH (ref 0–1000)

## 2015-12-28 LAB — PROTEIN, BODY FLUID

## 2015-12-28 LAB — GLUCOSE, CAPILLARY
GLUCOSE-CAPILLARY: 121 mg/dL — AB (ref 65–99)
GLUCOSE-CAPILLARY: 135 mg/dL — AB (ref 65–99)
Glucose-Capillary: 124 mg/dL — ABNORMAL HIGH (ref 65–99)
Glucose-Capillary: 115 mg/dL — ABNORMAL HIGH (ref 65–99)

## 2015-12-28 LAB — TACROLIMUS LEVEL: TACROLIMUS (FK506) - LABCORP: 6.8 ng/mL (ref 2.0–20.0)

## 2015-12-28 LAB — FERRITIN: Ferritin: 464 ng/mL — ABNORMAL HIGH (ref 11–307)

## 2015-12-28 MED ORDER — LIDOCAINE HCL (PF) 1 % IJ SOLN
INTRAMUSCULAR | Status: AC
  Filled 2015-12-28: qty 10

## 2015-12-28 MED ORDER — ATORVASTATIN CALCIUM 10 MG PO TABS: 10.0000 mg | ORAL_TABLET | Freq: Every day | ORAL | Status: DC

## 2015-12-28 MED ORDER — DILTIAZEM HCL ER COATED BEADS 240 MG PO CP24: 240.0000 mg | ORAL_CAPSULE | Freq: Every day | ORAL | Status: DC

## 2015-12-28 MED ORDER — METOPROLOL TARTRATE 50 MG PO TABS
75.0000 mg | ORAL_TABLET | Freq: Two times a day (BID) | ORAL | Status: DC
  Administered 2015-12-28 – 2015-12-29 (×2): 75 mg via ORAL
  Filled 2015-12-28 (×2): qty 1

## 2015-12-28 MED ORDER — METOPROLOL TARTRATE 75 MG PO TABS: 75.0000 mg | ORAL_TABLET | Freq: Two times a day (BID) | ORAL | Status: DC

## 2015-12-28 MED ORDER — TACROLIMUS 0.5 MG PO CAPS: ORAL_CAPSULE | ORAL | Status: DC

## 2015-12-28 MED ORDER — GUAIFENESIN ER 600 MG PO TB12: 1200.0000 mg | ORAL_TABLET | Freq: Two times a day (BID) | ORAL | Status: DC

## 2015-12-28 MED ORDER — APIXABAN 2.5 MG PO TABS: 2.5000 mg | ORAL_TABLET | Freq: Two times a day (BID) | ORAL | Status: DC

## 2015-12-28 NOTE — Progress Notes (Signed)
SATURATION QUALIFICATIONS: (This note is used to comply with regulatory documentation for home oxygen)  Patient Saturations on Room Air at Rest = 95%  Patient Saturations on Room Air while Ambulating = 85%  Patient Saturations on 2,Liters of oxygen while Ambulating = 97 %  Please briefly explain why patient needs home oxygen:

## 2015-12-28 NOTE — Progress Notes (Signed)
Physical Therapy Treatment Patient Details Name: Joy [name] MRN: [id number] DOB: 10/18/1931 Today's Date: 12/28/2015    History of Present Illness [name] is a 82 y.o. female with a Past Medical History of FSGS status post transplant, CAD status post cardiac catheterization, HTN, cholelithiasis, CHF, gout, C KD, DM who presents with A. fib with RVR and evidence of demand ischemia.    PT Comments    Pt able to move well today, needs increased time. Pt reports she has a grown granddaughter that lives with her and is able to assist her, along with multiple other family members. Patient safe to D/C from a mobility standpoint based on progression towards goals set on PT eval.    Follow Up Recommendations  Home health PT;Supervision - Intermittent     Equipment Recommendations  None recommended by PT       Precautions / Restrictions Precautions Precautions: Fall Precaution Comments: O2 dependent Restrictions Weight Bearing Restrictions: No    Mobility  Bed Mobility Overal bed mobility: Needs Assistance Bed Mobility: Supine to Sit;Sit to Supine     Supine to sit: Supervision Sit to supine: Supervision   General bed mobility comments: Bed flat, no rails used. Increased time needed, however pt able to get self up/down without assistance. PT was able to scoot herself up in bed as well without assistance.  Transfers   Equipment used: Rolling walker (2 wheeled) Transfers: Sit to/from Stand Sit to Stand: Supervision         General transfer comment: demo'd safe technique, no assist or cues needed. Does need increased time with transfers.  Ambulation/Gait Ambulation/Gait assistance: Supervision Ambulation Distance (Feet): 40 Feet Assistive device: Rolling walker (2 wheeled) Gait Pattern/deviations: Step-through pattern;Decreased stride length;Trunk flexed Gait velocity: decreased Gait velocity interpretation: Below normal speed for age/gender General Gait Details:  occasional cues to stay closer to RW with gait. Dyspnea 2/4 after gait, SaO2 95% on 2 lpm on oxygen.       Cognition Arousal/Alertness: Awake/alert Behavior During Therapy: WFL for tasks assessed/performed Overall Cognitive Status: Within Functional Limits for tasks assessed                       Pertinent Vitals/Pain Pain Assessment: No/denies pain     PT Goals (current goals can now be found in the care plan section) Acute Rehab PT Goals Patient Stated Goal: To go home in few days PT Goal Formulation: With patient Time For Goal Achievement: 12/31/15 Potential to Achieve Goals: Fair Progress towards PT goals: Progressing toward goals    Frequency  Min 3X/week    PT Plan Current plan remains appropriate    End of Session Equipment Utilized During Treatment: Oxygen;Gait belt Activity Tolerance: Patient tolerated treatment well;Patient limited by fatigue Patient left: in bed;with call bell/phone within reach     Time: [id number] PT Time Calculation (min) (ACUTE ONLY): 27 min  Charges:  $Gait Training: 8-22 mins $Therapeutic Activity: 8-22 mins            Sallyanne KusterBury, Kathy 12/28/2015, 11:31 AM  Sallyanne KusterKathy Bury, PTA, CLT Acute Rehab Services [phone] 12/28/2015, 11:31 AM

## 2015-12-28 NOTE — Progress Notes (Signed)
Pt returned from radiology. Drsg clean dry and intact. Pt lying in bed, set up to eat lunch.

## 2015-12-28 NOTE — Discharge Summary (Addendum)
Physician Discharge Summary  NARYA BEAVIN MRN: [id number] DOB/AGE: [birth year] 80 y.o.  PCP: No primary care provider on file.   Admit date: 12/18/2015 Discharge date: 12/28/2015  Discharge Diagnoses:     Principal Problem:   Pneumococcal pneumonia ([place]) Active Problems:   S/P kidney transplant   Diastolic CHF ([place])   CAD S/P remote RCA PCI   Hypertension   Elevated troponin   Bacteremia   CAP (community acquired pneumonia)   Pleural effusion on right   CKD (chronic kidney disease) stage 3, GFR 30-59 ml/min   PAF (paroxysmal atrial fibrillation) ([place])   Tachyarrhythmia    Follow-up recommendations Follow-up with PCP in 3-5 days , including all  additional recommended appointments as below Follow-up CBC, CMP in 3-5 days Patient to follow-up with Dr. Debara Pickett in one week Check Prograf levels in one to 2 days,Therapeutic goal 4-6       Current Discharge Medication List    START taking these medications   Details  apixaban (ELIQUIS) 2.5 MG TABS tablet Take 1 tablet (2.5 mg total) by mouth 2 (two) times daily. Qty: 60 tablet, Refills: 11    atorvastatin (LIPITOR) 10 MG tablet Take 1 tablet (10 mg total) by mouth daily at 6 PM. Qty: 30 tablet, Refills: 1    diltiazem (CARDIZEM CD) 240 MG 24 hr capsule Take 1 capsule (240 mg total) by mouth daily. Qty: 30 capsule, Refills: 1    guaiFENesin (MUCINEX) 600 MG 12 hr tablet Take 2 tablets (1,200 mg total) by mouth 2 (two) times daily. Qty: 20 tablet, Refills: 1      CONTINUE these medications which have CHANGED   Details  metoprolol 75 MG TABS Take 75 mg by mouth 2 (two) times daily. Qty: 60 tablet, Refills: 1    tacrolimus (PROGRAF) 0.5 MG capsule 1.5 mg in the morning and 1 mg at bedtime Qty: 120 capsule, Refills: 0      CONTINUE these medications which have NOT CHANGED   Details  allopurinol (ZYLOPRIM) 100 MG tablet Take 100 mg by mouth at bedtime.    colchicine 0.6 MG tablet Take 0.6 mg by mouth daily as needed.  For gout    famotidine (PEPCID) 20 MG tablet Take 20 mg by mouth 2 (two) times daily.    furosemide (LASIX) 80 MG tablet Take 1 tablet (80 mg total) by mouth daily.    mycophenolate (CELLCEPT) 250 MG capsule Take 500 mg by mouth 2 (two) times daily.    senna-docusate (SENOKOT-S) 8.6-50 MG per tablet Take 1 tablet by mouth daily as needed for mild constipation. Qty: 30 tablet, Refills: 3    aspirin EC 81 MG tablet Take 81 mg by mouth daily.    cinacalcet (SENSIPAR) 90 MG tablet Take 30 mg by mouth daily.     pantoprazole (PROTONIX) 20 MG tablet Take 1 tablet (20 mg total) by mouth daily. Qty: 30 tablet, Refills: 0      STOP taking these medications     simvastatin (ZOCOR) 20 MG tablet      amLODipine (NORVASC) 10 MG tablet      chlorpheniramine-HYDROcodone (TUSSIONEX) 10-8 MG/5ML [facility]          Discharge Condition: Stable    Discharge Instructions Get Medicines reviewed and adjusted: Please take all your medications with you for your next visit with your Primary MD  Please request your Primary MD to go over all hospital tests and procedure/radiological results at the follow up, please ask your Primary MD to  get all Hospital records sent to his/her office.  If you experience worsening of your admission symptoms, develop shortness of breath, life threatening emergency, suicidal or homicidal thoughts you must seek medical attention immediately by calling 911 or calling your MD immediately if symptoms less severe.  You must read complete instructions/literature along with all the possible adverse reactions/side effects for all the Medicines you take and that have been prescribed to you. Take any new Medicines after you have completely understood and accpet all the possible adverse reactions/side effects.   Do not drive when taking Pain medications.   Do not take more than prescribed Pain, Sleep and Anxiety Medications  Special Instructions: If you have smoked or chewed  Tobacco in the last 2 yrs please stop smoking, stop any regular Alcohol and or any Recreational drug use.  Wear Seat belts while driving.  Please note  You were cared for by a hospitalist during your hospital stay. Once you are discharged, your primary care physician will handle any further medical issues. Please note that NO REFILLS for any discharge medications will be authorized once you are discharged, as it is imperative that you return to your primary care physician (or establish a relationship with a primary care physician if you do not have one) for your aftercare needs so that they can reassess your need for medications and monitor your lab values.  Discharge Instructions    Amb Referral to Cardiac Rehabilitation    Complete by:  As directed   Diagnosis:  NSTEMI            Allergies  Allergen Reactions  . Sulfa Drugs Cross Reactors Swelling      Disposition: Home with home health   Consults:  Cardiology Nephrology PCCM    Significant Diagnostic Studies:  Dg Chest 2 View  12/26/2015  CLINICAL DATA:  SOB WITH EXERTION, PLEURAL EFFUSION,HX DM,CAD,KIDNEY TRANSPLANT EXAM: CHEST  2 VIEW COMPARISON:  12/25/2015 FINDINGS: Heart is enlarged. There has been some improvement in aeration of right lung base. Small right pleural effusion persists. Left lung is clear. IMPRESSION: 1. Improved aeration at the right lung base. 2. Right pleural effusion. 3. Cardiomegaly. Electronically Signed   By: Nolon Nations [name]   On: 12/26/2015 08:59   Ct Chest Wo Contrast  12/23/2015  CLINICAL DATA:  Shortness of breath. Pleural effusion. Congestive heart failure and diabetes. EXAM: CT CHEST WITHOUT CONTRAST TECHNIQUE: Multidetector CT imaging of the chest was performed following the standard protocol without IV contrast. COMPARISON:  Chest radiograph dated 12/23/2015 FINDINGS: Mediastinum/Lymph Nodes: No masses or pathologically enlarged lymph nodes identified on this un-enhanced exam. The  heart is enlarged. There is a small pericardial effusion. Atherosclerotic disease of the coronary arteries and thoracic aorta is noted. Layering material fills the esophagus dependently. Lungs/Pleura: There is a moderate to large water density right pleural effusion. There is right lower lobe airspace consolidation versus lobar atelectasis. There is patchy airspace consolidation in the basilar segments of the left lower lobe. Upper abdomen: No acute findings. Musculoskeletal: No chest wall mass or suspicious bone lesions identified. IMPRESSION: Large right pleural effusion with right lower lobe collapse. Patchy airspace disease within the dependent segments of the left lower lobe. Heterogeneous material filling the distal 2/3 of the esophagus. These findings raise the possibility of aspiration pneumonia. Endobronchial lesion causing collapse of the right lower lobe is also in the differential diagnosis, although one is not visualized on this nonenhanced exam. Alternatively the large right pleural effusion may be causing  secondary compressive atelectasis of the right lower lobe. Enlarged cardiac silhouette. Atherosclerotic disease of the coronary arteries and aorta. Electronically Signed   By: Fidela Salisbury [name]   On: 12/23/2015 14:41   Nm Myocar Multi W/spect W/wall Motion / Ef  12/26/2015  CLINICAL DATA:  Coronary artery disease, CHF, hypertension, diabetes, chest pain, shortness of Breath EXAM: MYOCARDIAL IMAGING WITH SPECT (REST AND PHARMACOLOGIC-STRESS) GATED LEFT VENTRICULAR WALL MOTION STUDY LEFT VENTRICULAR EJECTION FRACTION TECHNIQUE: Standard myocardial SPECT imaging was performed after resting intravenous injection of 10 mCi [id number]. Subsequently, intravenous infusion of Lexiscan was performed under the supervision of the Cardiology staff. At peak effect of the drug, 30 mCi [id number] was injected intravenously and standard myocardial SPECT imaging was performed. Quantitative gated  imaging was also performed to evaluate left ventricular wall motion, and estimate left ventricular ejection fraction. COMPARISON:  None. FINDINGS: Perfusion: There is a small area of moderately decreased activity in the inferior wall of the left ventricular myocardium on both the resting and stress images. No suggestion of ischemia. Wall Motion: No left ventricular enlargement. There is decreased myocardial thickening in wall motion involving the septum and inferior wall of the left ventricle. Left Ventricular Ejection Fraction: 52 % End diastolic volume 94 ml End systolic volume 45 ml IMPRESSION: 1. Probable inferior wall infarct.  No evidence of ischemia. 2. Mild inferior wall and septal hypokinesis. 3. Left ventricular ejection fraction 52% 4. Low-risk stress test findings*. *2012 Appropriate Use Criteria for Coronary Revascularization Focused Update: J Am Coll Cardiol. 200938;18(2):993-716http://[email or web address]?articleid=[email or web address] Electronically Signed   By: D Lucrezia Europe.D.   On: 12/26/2015 16:52   Dg Chest Port 1 View  12/25/2015  CLINICAL DATA:  Pleural effusion.  Thoracentesis. EXAM: PORTABLE CHEST 1 VIEW COMPARISON:  12/24/2015.  CT 12/23/2015. FINDINGS: Mediastinum and hilar structures are stable. Stable cardiomegaly. Continued re-expansion of right base atelectasis. Right base infiltrate present. Small right pleural effusion. No pneumothorax. IMPRESSION: 1. Continued re-expansion of right base atelectasis. Right base infiltrates present. Small right pleural effusion. 2. Stable cardiomegaly.  No pulmonary venous congestion . Electronically Signed   By: ThMarcello MooresRegister   On: 12/25/2015 07:42   Dg Chest Port 1 View  12/24/2015  CLINICAL DATA:  Status post right thoracentesis EXAM: PORTABLE CHEST 1 VIEW COMPARISON:  Earlier today FINDINGS: Decreased right pleural effusion, trace to small currently. No pneumothorax or re-expansion edema. Patchy opacity in the right base along its is  presumably incomplete resolution of atelectasis given findings of right middle and lower lobe collapse on chest CT 12/23/2015. The left lung is clear. Stable aortic contours. IMPRESSION: 1. No acute finding after right thoracentesis. There is minimal residual fluid. 2. Partial re-expansion of the collapsed right middle and lower lobe seen on CT yesterday. Electronically Signed   By: JoMonte Fantasia.D.   On: 12/24/2015 13:59   Dg Chest Port 1 View  12/24/2015  CLINICAL DATA:  Shortness of breath.  Pleural effusion . EXAM: PORTABLE CHEST 1 VIEW COMPARISON:  CT 12/23/2015.  Chest x-ray 12/23/2015. FINDINGS: Mediastinum hilar structures are stable. Stable cardiomegaly. Stable right lower lobe atelectasis and/or consolidation and right pleural effusion. Left lung is clear. No pneumothorax. IMPRESSION: 1. Stable right lower lobe atelectasis and/or consolidation and right pleural effusion. 2. Stable cardiomegaly. Electronically Signed   By: ThMarcello MooresRegister   On: 12/24/2015 07:11   Dg Chest Port 1 View  12/23/2015  CLINICAL DATA:  Pneumonia. EXAM: PORTABLE CHEST 1 VIEW COMPARISON:  12/22/2015. FINDINGS: Stable cardiomegaly.  Progressive infiltrate right lower lobe. Progressive right pleural effusion. Mild left base subsegmental atelectasis and or infiltrate. Left hemidiaphragm not imaged. No pneumothorax. IMPRESSION: 1. Progressive right lower lobe infiltrate and right pleural effusion. 2.  Left base subsegmental atelectasis and or mild infiltrate. 3.  Cardiomegaly.  No pulmonary venous congestion . Electronically Signed   By: Marcello Moores  Register   On: 12/23/2015 07:18   Dg Chest Port 1 View  12/22/2015  CLINICAL DATA:  Continued assessment of pleural effusion. EXAM: PORTABLE CHEST 1 VIEW COMPARISON:  12/21/2015. FINDINGS: Cardiomegaly. Large RIGHT pleural effusion is increased. Compressive atelectasis at the RIGHT base. Trace LEFT effusion may be improved. Overall improved vascular congestion. IMPRESSION:  Increasing RIGHT pleural effusion otherwise improved exam. Electronically Signed   By: Staci Righter [name]   On: 12/22/2015 07:27   Dg Chest Port 1 View  12/21/2015  CLINICAL DATA:  Community-acquired pneumonia EXAM: PORTABLE CHEST 1 VIEW COMPARISON:  Chest radiograph from one day prior. FINDINGS: Stable cardiomediastinal silhouette with mild cardiomegaly. No pneumothorax. Moderate right and small left pleural effusions, increased bilaterally. Mild pulmonary edema, increased. Patchy bibasilar lung consolidation, right greater than left, slightly worsened bilaterally. IMPRESSION: 1. Patchy bibasilar lung consolidation, right greater than left, slightly worsened bilaterally, likely a combination of atelectasis and pneumonia. 2. Stable mild cardiomegaly with increased mild pulmonary edema. 3. Moderate right and small left pleural effusions, increased bilaterally. Electronically Signed   By: Ilona Sorrel [name]   On: 12/21/2015 08:39   Dg Chest Port 1 View  12/20/2015  CLINICAL DATA:  Shortness of breath EXAM: PORTABLE CHEST 1 VIEW COMPARISON:  12/18/2015 FINDINGS: New right lower lung opacity, likely combination of atelectasis/ pneumonia and layering pleural effusion. Mild left basilar opacity, possibly atelectasis. Cardiomegaly.  No frank interstitial edema. No pneumothorax. IMPRESSION: New right lower lung opacity, likely combination of atelectasis/ pneumonia and layering pleural effusion. Mild left basilar opacity, possibly atelectasis. Electronically Signed   By: Julian Hy [name]   On: 12/20/2015 11:53   Dg Chest Port 1 View  12/18/2015  CLINICAL DATA:  Productive cough for 2 weeks, shortness of breath, headache EXAM: PORTABLE CHEST 1 VIEW COMPARISON:  08/11/2014 FINDINGS: Borderline cardiomegaly. No pulmonary edema. There is streaky left base retrocardiac atelectasis or early infiltrate. Atherosclerotic but for sclerotic calcifications of thoracic aorta. IMPRESSION: Streaky left base retrocardiac  atelectasis or early infiltrate. No pulmonary edema. Follow-up to resolution recommended. Electronically Signed   By: Lahoma Crocker [name]   On: 12/18/2015 09:38    2-D echo LV EF: 55% - 60%  ------------------------------------------------------------------- Indications: Atrial fibrillation - 427.31.  ------------------------------------------------------------------- History: PMH: Dyspnea. Coronary artery disease. Risk factors: Chronic kidney disease, status post renal transplant. Hypertension. Diabetes mellitus. Dyslipidemia.  ------------------------------------------------------------------- Study Conclusions  - Left ventricle: The cavity size was normal. Wall thickness was  increased in a pattern of severe LVH. Systolic function was  normal. The estimated ejection fraction was in the range of 55%  to 60%. Wall motion was normal; there were no regional wall  motion abnormalities. - Mitral valve: There was mild regurgitation. - Left atrium: The atrium was moderately dilated. - Tricuspid valve: There was moderate regurgitation. - Pulmonary arteries: Systolic pressure was moderately increased.  PA peak pressure: 44 mm Hg (S).  Impressions:  - Normal LV systolic function; severe LVH; moderate LAE; mild MR;  moderate TR; moderately elevated pulmonary pressure.   Filed Weights   12/26/15 0501 12/27/15 0618 12/28/15 0624  Weight: 74.934 kg (165 lb 3.2 oz) 74.98 kg (165  lb 4.8 oz) 75.297 kg (166 lb)     Microbiology: Recent Results (from the past 240 hour(s))  Culture, blood (Routine X 2) w Reflex to ID Panel     Status: Abnormal   Collection Time: 12/18/15 10:49 AM  Result Value Ref Range Status   Specimen Description BLOOD RIGHT HAND  Final   Special Requests BOTTLES DRAWN AEROBIC AND ANAEROBIC  5CC  Final   Culture  Setup Time   Final    GRAM POSITIVE COCCI IN CHAINS IN PAIRS IN BOTH AEROBIC AND ANAEROBIC BOTTLES Organism ID to follow CRITICAL  RESULT CALLED TO, READ BACK BY AND VERIFIED WITH: CARON AMEND,PHARMD@0050  12/19/15 MKELLY    Culture STREPTOCOCCUS PNEUMONIAE (A)  Final   Report Status 12/21/2015 FINAL  Final   Organism ID, Bacteria STREPTOCOCCUS PNEUMONIAE  Final      Susceptibility   Streptococcus pneumoniae - MIC*    ERYTHROMYCIN 2 RESISTANT Resistant     LEVOFLOXACIN 0.5 SENSITIVE Sensitive     VANCOMYCIN 0.5 SENSITIVE Sensitive     PENICILLIN 0.25 SENSITIVE Sensitive     CEFTRIAXONE 0.25 SENSITIVE Sensitive     * STREPTOCOCCUS PNEUMONIAE  Blood Culture ID Panel (Reflexed)     Status: Abnormal   Collection Time: 12/18/15 10:49 AM  Result Value Ref Range Status   Enterococcus species NOT DETECTED NOT DETECTED Final   Vancomycin resistance NOT DETECTED NOT DETECTED Final   Listeria monocytogenes NOT DETECTED NOT DETECTED Final   Staphylococcus species NOT DETECTED NOT DETECTED Final   Staphylococcus aureus NOT DETECTED NOT DETECTED Final   Methicillin resistance NOT DETECTED NOT DETECTED Final   Streptococcus species NOT DETECTED NOT DETECTED Final   Streptococcus agalactiae NOT DETECTED NOT DETECTED Final   Streptococcus pneumoniae DETECTED (A) NOT DETECTED Final    Comment: CRITICAL RESULT CALLED TO, READ BACK BY AND VERIFIED WITH: CARON AMEND,PHARMD @0050  12/19/15 MKELLY    Streptococcus pyogenes NOT DETECTED NOT DETECTED Final   Acinetobacter baumannii NOT DETECTED NOT DETECTED Final   Enterobacteriaceae species NOT DETECTED NOT DETECTED Final   Enterobacter cloacae complex NOT DETECTED NOT DETECTED Final   Escherichia coli NOT DETECTED NOT DETECTED Final   Klebsiella oxytoca NOT DETECTED NOT DETECTED Final   Klebsiella pneumoniae NOT DETECTED NOT DETECTED Final   Proteus species NOT DETECTED NOT DETECTED Final   Serratia marcescens NOT DETECTED NOT DETECTED Final   Carbapenem resistance NOT DETECTED NOT DETECTED Final   Haemophilus influenzae NOT DETECTED NOT DETECTED Final   Neisseria meningitidis NOT  DETECTED NOT DETECTED Final   Pseudomonas aeruginosa NOT DETECTED NOT DETECTED Final   Candida albicans NOT DETECTED NOT DETECTED Final   Candida glabrata NOT DETECTED NOT DETECTED Final   Candida krusei NOT DETECTED NOT DETECTED Final   Candida parapsilosis NOT DETECTED NOT DETECTED Final   Candida tropicalis NOT DETECTED NOT DETECTED Final  Culture, blood (Routine X 2) w Reflex to ID Panel     Status: Abnormal   Collection Time: 12/18/15 10:59 AM  Result Value Ref Range Status   Specimen Description BLOOD LEFT ANTECUBITAL  Final   Special Requests BOTTLES DRAWN AEROBIC AND ANAEROBIC  5CC  Final   Culture  Setup Time   Final    GRAM POSITIVE COCCI IN PAIRS IN CHAINS IN BOTH AEROBIC AND ANAEROBIC BOTTLES CRITICAL RESULT CALLED TO, READ BACK BY AND VERIFIED WITH: CARON AMEND,PHARMD @0050  12/19/15 KELLYM    Culture (A)  Final    STREPTOCOCCUS PNEUMONIAE SUSCEPTIBILITIES PERFORMED ON PREVIOUS CULTURE WITHIN THE  LAST 5 DAYS.    Report Status 12/21/2015 FINAL  Final  MRSA PCR Screening     Status: Abnormal   Collection Time: 12/18/15  9:20 PM  Result Value Ref Range Status   MRSA by PCR POSITIVE (A) NEGATIVE Final    Comment:        The GeneXpert MRSA Assay (FDA approved for NASAL specimens only), is one component of a comprehensive MRSA colonization surveillance program. It is not intended to diagnose MRSA infection nor to guide or monitor treatment for MRSA infections. RESULT CALLED TO, READ BACK BY AND VERIFIED WITH: S ROUSE,RN @0302  12/19/15 MKELLY   Culture, blood (Routine X 2) w Reflex to ID Panel     Status: None   Collection Time: 12/22/15 10:58 AM  Result Value Ref Range Status   Specimen Description BLOOD RIGHT HAND  Final   Special Requests IN PEDIATRIC BOTTLE  3CC  Final   Culture NO GROWTH 5 DAYS  Final   Report Status 12/27/2015 FINAL  Final  Culture, blood (Routine X 2) w Reflex to ID Panel     Status: None   Collection Time: 12/22/15 11:00 AM  Result Value  Ref Range Status   Specimen Description BLOOD RIGHT HAND  Final   Special Requests IN PEDIATRIC BOTTLE 3.0CC  Final   Culture NO GROWTH 5 DAYS  Final   Report Status 12/27/2015 FINAL  Final  Gram stain     Status: None   Collection Time: 12/24/15  9:01 PM  Result Value Ref Range Status   Specimen Description PLEURAL  Final   Special Requests Immunocompromised  Final   Gram Stain   Final    CYTOSPIN SMEAR WBC PRESENT,BOTH PMN AND MONONUCLEAR NO ORGANISMS SEEN    Report Status 12/25/2015 FINAL  Final  Culture, body fluid-bottle     Status: None (Preliminary result)   Collection Time: 12/24/15  9:01 PM  Result Value Ref Range Status   Specimen Description PLEURAL  Final   Special Requests AEROBIC BOTTLE ONLY  Final   Culture NO GROWTH 2 DAYS  Final   Report Status PENDING  Incomplete       Blood Culture    Component Value Date/Time   SDES PLEURAL 12/24/2015 2101   SDES PLEURAL 12/24/2015 2101   SPECREQUEST Immunocompromised 12/24/2015 2101   SPECREQUEST AEROBIC BOTTLE ONLY 12/24/2015 2101   CULT NO GROWTH 2 DAYS 12/24/2015 2101   REPTSTATUS 12/25/2015 FINAL 12/24/2015 2101   REPTSTATUS PENDING 12/24/2015 2101      Labs: Results for orders placed or performed during the hospital encounter of 12/18/15 (from the past 48 hour(s))  Glucose, capillary     Status: Abnormal   Collection Time: 12/26/15 12:12 PM  Result Value Ref Range   Glucose-Capillary 131 (H) 65 - 99 mg/dL  Comprehensive metabolic panel     Status: Abnormal   Collection Time: 12/26/15  2:24 PM  Result Value Ref Range   Sodium 134 (L) 135 - 145 mmol/L   Potassium 3.8 3.5 - 5.1 mmol/L   Chloride 102 101 - 111 mmol/L   CO2 22 22 - 32 mmol/L   Glucose, Bld 104 (H) 65 - 99 mg/dL   BUN 49 (H) 6 - 20 mg/dL   Creatinine, Ser 1.53 (H) 0.44 - 1.00 mg/dL   Calcium 9.6 8.9 - 10.3 mg/dL   Total Protein 6.0 (L) 6.5 - 8.1 g/dL   Albumin 2.8 (L) 3.5 - 5.0 g/dL   AST 19 15 - 41  U/L   ALT 11 (L) 14 - 54 U/L    Alkaline Phosphatase 87 38 - 126 U/L   Total Bilirubin 0.3 0.3 - 1.2 mg/dL   GFR calc non Af Amer 31 (L) >60 mL/min   GFR calc Af Amer 35 (L) >60 mL/min    Comment: (NOTE) The eGFR has been calculated using the CKD EPI equation. This calculation has not been validated in all clinical situations. eGFR's persistently <60 mL/min signify possible Chronic Kidney Disease.    Anion gap 10 5 - 15  Glucose, capillary     Status: None   Collection Time: 12/26/15  5:06 PM  Result Value Ref Range   Glucose-Capillary 98 65 - 99 mg/dL  Glucose, capillary     Status: Abnormal   Collection Time: 12/26/15  9:40 PM  Result Value Ref Range   Glucose-Capillary 175 (H) 65 - 99 mg/dL  CBC     Status: Abnormal   Collection Time: 12/27/15  3:07 AM  Result Value Ref Range   WBC 7.6 4.0 - 10.5 K/uL   RBC 3.87 3.87 - 5.11 MIL/uL   Hemoglobin 10.7 (L) 12.0 - 15.0 g/dL   HCT 34.3 (L) 36.0 - 46.0 %   MCV 88.6 78.0 - 100.0 fL   MCH 27.6 26.0 - 34.0 pg   MCHC 31.2 30.0 - 36.0 g/dL   RDW 14.6 11.5 - 15.5 %   Platelets 286 150 - 400 K/uL  Comprehensive metabolic panel     Status: Abnormal   Collection Time: 12/27/15  3:07 AM  Result Value Ref Range   Sodium 134 (L) 135 - 145 mmol/L   Potassium 4.2 3.5 - 5.1 mmol/L   Chloride 101 101 - 111 mmol/L   CO2 22 22 - 32 mmol/L   Glucose, Bld 123 (H) 65 - 99 mg/dL   BUN 49 (H) 6 - 20 mg/dL   Creatinine, Ser 1.64 (H) 0.44 - 1.00 mg/dL   Calcium 9.4 8.9 - 10.3 mg/dL   Total Protein 5.4 (L) 6.5 - 8.1 g/dL   Albumin 2.6 (L) 3.5 - 5.0 g/dL   AST 16 15 - 41 U/L   ALT 10 (L) 14 - 54 U/L   Alkaline Phosphatase 79 38 - 126 U/L   Total Bilirubin 0.5 0.3 - 1.2 mg/dL   GFR calc non Af Amer 28 (L) >60 mL/min   GFR calc Af Amer 33 (L) >60 mL/min    Comment: (NOTE) The eGFR has been calculated using the CKD EPI equation. This calculation has not been validated in all clinical situations. eGFR's persistently <60 mL/min signify possible Chronic Kidney Disease.    Anion  gap 11 5 - 15  Glucose, capillary     Status: Abnormal   Collection Time: 12/27/15 11:20 AM  Result Value Ref Range   Glucose-Capillary 171 (H) 65 - 99 mg/dL  Glucose, capillary     Status: Abnormal   Collection Time: 12/27/15  4:33 PM  Result Value Ref Range   Glucose-Capillary 174 (H) 65 - 99 mg/dL  Glucose, capillary     Status: None   Collection Time: 12/27/15  9:33 PM  Result Value Ref Range   Glucose-Capillary 95 65 - 99 mg/dL  Comprehensive metabolic panel     Status: Abnormal   Collection Time: 12/28/15  5:05 AM  Result Value Ref Range   Sodium 136 135 - 145 mmol/L   Potassium 4.6 3.5 - 5.1 mmol/L   Chloride 103 101 - 111 mmol/L  CO2 22 22 - 32 mmol/L   Glucose, Bld 117 (H) 65 - 99 mg/dL   BUN 47 (H) 6 - 20 mg/dL   Creatinine, Ser 1.55 (H) 0.44 - 1.00 mg/dL   Calcium 9.4 8.9 - 10.3 mg/dL   Total Protein 6.0 (L) 6.5 - 8.1 g/dL   Albumin 2.7 (L) 3.5 - 5.0 g/dL   AST 16 15 - 41 U/L   ALT 10 (L) 14 - 54 U/L   Alkaline Phosphatase 83 38 - 126 U/L   Total Bilirubin 0.6 0.3 - 1.2 mg/dL   GFR calc non Af Amer 30 (L) >60 mL/min   GFR calc Af Amer 35 (L) >60 mL/min    Comment: (NOTE) The eGFR has been calculated using the CKD EPI equation. This calculation has not been validated in all clinical situations. eGFR's persistently <60 mL/min signify possible Chronic Kidney Disease.    Anion gap 11 5 - 15  Glucose, capillary     Status: Abnormal   Collection Time: 12/28/15  7:40 AM  Result Value Ref Range   Glucose-Capillary 124 (H) 65 - 99 mg/dL     Lipid Panel     Component Value Date/Time   CHOL 101 12/19/2015 0242   TRIG 83 12/19/2015 0242   HDL 34* 12/19/2015 0242   CHOLHDL 3.0 12/19/2015 0242   VLDL 17 12/19/2015 0242   LDLCALC 50 12/19/2015 0242     Lab Results  Component Value Date   HGBA1C 6.9* 12/18/2015     Lab Results  Component Value Date   LDLCALC 50 12/19/2015   CREATININE 1.55* 12/28/2015    82 y.o. female with a history of CAD, HTN, DM2,  CKD s/p renal transplant in 2003, admitted on 5/18 after presenting with shortness of breath and chest pain, found to have A fib with RVR (CHADVASC 7), as well as NSTEMI from demand ischemia. Also had a pneumococcal pneumonia Hospital course complicated due to development of acute hypoxic respiratory failure with pneumococcal pneumonia/parapneumonic effusion. Marland Kitchen PCCM involving care. Nephrology consult and given worsened renal function. Patient transferred to hospitalist service for further care.Patient status post bedside thoracentesis on 5/24. Scheduled for Lexiscan Myoview on 5/26     Hospital course    Acute respiratory failure with hypoxia secondary to pneumococcal pneumonia/Strep pneumo bacteremia and pleural effusion Serial chest x-ray now shows continued reexpansion of the right base. Receiving empiric IV Rocephin (10 day course) started 5/18.   completed on 5/28. Continue nebulizers, Mucinex and pulmonary toilet. PCCM recommended CT scan that showed a large right pleural effusion with right lower lobe collapse and patchy airspace disease of the left lower lobe, finding suspicion for aspiration pneumonia, endobronchial lesion causing collapse of the right lower lobe is also in the differential diagnosis s/p thoracentesis bedside on 5/24 with 1460 removed, clear yellow. Likely exudative by LDH, repeat chest x-ray shows reexpansion of the right base, follow pleural fluid culture from 5/24, no growth from blood culture from 5/22. Last positive blood culture was on 5/18 Patient still quite hypoxic requiring 2-3 L of oxygen with ambulation Patient will get a repeat chest x-ray and ultrasound of the right pleural space prior to discharge to assess need for repeat  thoracentesis prior to discharge  Acute on Chronic kidney disease stage III-baseline around 1.4 Continue Prograf. Dosage being adjusted by nephrology. Prograf level on 5/25 was 8. Repeat Prograf level still pending.  Creatinine 2.32>1.55, suspected to hemodynamically mediated with atrial fibrillation/rapid ventricular response vs ATN, approaching baseline Status  post renal transplant in 2003 maintained on Tac/MMF  Continue to monitor Prograf levels, currently on Lasix 80 mg a day Monitor Prograf levels, goal 4-6   A. fib with RVR,CHA2DS2VASc = 7 Required Cardizem drip on admission. Now stable on oral Cardizem and metoprolol. 2-D echo showed normal EF. now in sinus rhythm. Treated with IV heparin and subsequently switched to Eliquis 6 and follow-up with Dr. Debara Pickett in one week. Dose of metoprolol   reduced to 75 twice a day for due to bradycardia    NSTE MI,s/p RCA BMS in 2002 - residual LAD and Diag dzs Likely secondary to demand ischemia with rapid A. fib/pneumonia. given renal transplant h/o and risk of nephrotoxicity of contrast dye, decided to do Myoview instead Lexiscan Myoview 5/26 -showed inferior wall infarct with no evidence of reversible ischemia and EF of 52%   Acute on Chronic Diastolic CHF: BNP was abnormal at 1200 on admit. . Renal function fluctuating. She was on 80 mg PO lasix as an outpatient. Home dose resumed. hold ARB. Strict I/Os.   diabetes mellitus type II-stable Continue sliding scale coverage. Hemoglobin A1c 6.9  physical deconditioning PT recommended home health-ordered         Discharge Exam:    Blood pressure 148/67, pulse 55, temperature 97.5 F (36.4 C), temperature source Oral, resp. rate 18, height 5' 3"  (1.6 m), weight 75.297 kg (166 lb), SpO2 94 %.   General: Well developed, well nourished, female appearing in no acute distress. Head: Normocephalic, atraumatic. Neck: Supple without bruits, JVD not elevated. Lungs: Resp regular and unlabored, rhonchi at bases. Heart: RRR, S1, S2, no S3, S4, or murmur; no rub. Abdomen: Soft, non-tender, non-distended with normoactive bowel sounds. No hepatomegaly. No rebound/guarding. No obvious abdominal  masses. Extremities: No clubbing, cyanosis, or edema. Distal pedal pulses are 2+ bilaterally. Neuro: Alert and oriented X 3. Moves all extremities spontaneously.   Follow-up Information    Follow up with Pixie Casino, MD. Schedule an appointment as soon as possible for a visit in 1 week.   Specialty:  Cardiology   Why:  Hospital follow-up   Contact information:   [address] [place] [address] [phone]       Follow up with PCP. Schedule an appointment as soon as possible for a visit in 1 week.      SignedReyne Dumas 12/28/2015, 9:01 AM        Time spent >45 mins

## 2015-12-28 NOTE — Progress Notes (Signed)
Patient ID: [name], female   DOB: 1932/05/16, 82 y.o.   MRN: [id number]  [facility] Progress Note   Assessment/ Plan:   1. AKI on CKD3T: Status post renal transplant in 2003 maintained on Tac/MMF with baseline creatinine between 1.0-1.4. Current acute on chronic renal failure suspected to be hemodynamically mediated from A. fib/RVR versus normotensive ATN with associated community acquired pneumonia. Creatinine relatively stable at about 1.5-Prograf levels to be rechecked again tomorrow after dosage decreased because of CYP interaction with diltiazem. No acute electrolyte abnormalities and she is close to euvolemic on physical exam. She remains deconditioned and does not want to go to a skilled nursing facility-awaiting reevaluation by physical therapy for assessment of needs at the time of discharge. 2. A. fib with RVR: She appears to have converted back to normal sinus rhythm and now from intravenous to oral diltiazem. She is on both diltiazem and metoprolol-may need adjustment of dosing for hypotension/bradycardia 3. NSTEMI with history of coronary artery disease: This is suspected to have been from demand ischemia but with history of CAD-needs further inquiry. Negative nuclear stress test yesterday.  4. Community acquired pneumonia/right pleural effusion: Status post completion of course of Rocephin therapy. Parapneumonic effusion status post thoracentesis 3 days ago with symptomatic relief. 5. 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  Subjective:   Earlier hypoxic with ambulation-- still feels weak but improved dyspnea after thoracentesis.    Objective:   BP 148/67 mmHg  Pulse 58  Temp(Src) 97.5 F (36.4 C) (Oral)  Resp 18  Ht  (1.6 m)  Wt 75.297 kg (166 lb)  BMI 29.41 kg/m2  SpO2 100%  Intake/Output Summary (Last 24 hours) at 12/28/15 1042 Last data filed at 12/28/15 0856  Gross per 24 hour  Intake    480 ml  Output    300  ml  Net    180 ml   Weight change: 0.318 kg (11.2 oz)  Physical Exam: Gen: Comfortably resting in bed  CVS: Pulse regular bradycardia, S1 and S2 normal Resp: clear breath sounds bilaterally-no distant rales  Abd: Soft, flat, nontender Ext: Trace lower extremity edema  Imaging: Nm Myocar Multi W/spect W/wall Motion / Ef  12/26/2015  CLINICAL DATA:  Coronary artery disease, CHF, hypertension, diabetes, chest pain, shortness of Breath EXAM: MYOCARDIAL IMAGING WITH SPECT (REST AND PHARMACOLOGIC-STRESS) GATED LEFT VENTRICULAR WALL MOTION STUDY LEFT VENTRICULAR EJECTION FRACTION TECHNIQUE: Standard myocardial SPECT imaging was performed after resting intravenous injection of 10 mCi Tc-24m sestamibi. Subsequently, intravenous infusion of Lexiscan was performed under the supervision of the Cardiology staff. At peak effect of the drug, 30 mCi Tc-54m sestamibi was injected intravenously and standard myocardial SPECT imaging was performed. Quantitative gated imaging was also performed to evaluate left ventricular wall motion, and estimate left ventricular ejection fraction. COMPARISON:  None. FINDINGS: Perfusion: There is a small area of moderately decreased activity in the inferior wall of the left ventricular myocardium on both the resting and stress images. No suggestion of ischemia. Wall Motion: No left ventricular enlargement. There is decreased myocardial thickening in wall motion involving the septum and inferior wall of the left ventricle. Left Ventricular Ejection Fraction: 52 % End diastolic volume 94 ml End systolic volume 45 ml IMPRESSION: 1. Probable inferior wall infarct.  No evidence of ischemia. 2. Mild inferior wall and septal hypokinesis. 3. Left ventricular ejection fraction 52% 4. Low-risk stress test findings*. *2012 Appropriate Use Criteria for Coronary Revascularization Focused Update: J Am Coll Cardiol. 2012;59(9):857-881. http://content.dementiazones.com.aspx?articleid=1201161  Electronically Signed   By: Corlis Leak  Hassell [name]   On: 12/26/2015 16:52    Labs: BMET  Recent Labs Lab 12/22/15 0243 12/23/15 0241 12/24/15 0320 12/25/15 0534 12/26/15 1424 12/27/15 0307 12/28/15 0505  NA 128* 131* 129* 134* 134* 134* 136  K 3.9 3.6 3.8 4.4 3.8 4.2 4.6  CL 94* 98* 97* 101 102 101 103  CO2 19* 20* 19* 23 22 22 22   GLUCOSE 137* 119* 126* 110* 104* 123* 117*  BUN 59* 70* 69* 61* 49* 49* 47*  CREATININE 2.32* 1.29* 1.96* 1.72* 1.53* 1.64* 1.55*  CALCIUM 8.9 9.2 9.6 9.9 9.6 9.4 9.4   CBC  Recent Labs Lab 12/23/15 0241 12/24/15 0320 12/25/15 0534 12/27/15 0307  WBC 13.4* 12.2* 8.3 7.6  HGB 11.4* 11.2* 11.3* 10.7*  HCT 35.0* 34.4* 35.8* 34.3*  MCV 86.4 86.4 87.5 88.6  PLT 280 305 306 286    Medications:    . albuterol  2.5 mg Nebulization BID  . allopurinol  100 mg Oral QHS  . antiseptic oral rinse  7 mL Mouth Rinse BID  . apixaban  2.5 mg Oral BID  . aspirin EC  81 mg Oral Daily  . atorvastatin  10 mg Oral q1800  . cinacalcet  30 mg Oral Q supper  . diltiazem  240 mg Oral Daily  . famotidine  20 mg Oral Daily  . furosemide  80 mg Oral Daily  . guaiFENesin  1,200 mg Oral BID  . insulin aspart  0-15 Units Subcutaneous TID WC  . metoprolol  75 mg Oral BID  . mycophenolate  500 mg Oral BID  . sodium chloride flush  3 mL Intravenous Q12H  . tacrolimus  1 mg Oral QHS  . tacrolimus  1.5 mg Oral q morning - 10a   Zetta BillsJay Drayden Lukas, MD 12/28/2015, 10:42 AM

## 2015-12-28 NOTE — Procedures (Signed)
US thora RIGHT 880 ml removed No complication No blood loss. [facility]. CXR pending

## 2015-12-28 NOTE — Progress Notes (Signed)
Pt ambulated in the room, O2 dropped to 85% on room air. Returned to bed place on O2 at  2L 97%

## 2015-12-29 LAB — RENAL FUNCTION PANEL
ALBUMIN: 2.7 g/dL — AB (ref 3.5–5.0)
ANION GAP: 8 (ref 5–15)
BUN: 44 mg/dL — ABNORMAL HIGH (ref 6–20)
CO2: 26 mmol/L (ref 22–32)
Calcium: 9.2 mg/dL (ref 8.9–10.3)
Chloride: 103 mmol/L (ref 101–111)
Creatinine, Ser: 1.77 mg/dL — ABNORMAL HIGH (ref 0.44–1.00)
GFR calc Af Amer: 30 mL/min — ABNORMAL LOW (ref >60)
GFR calc non Af Amer: 26 mL/min — ABNORMAL LOW (ref >60)
GLUCOSE: 131 mg/dL — AB (ref 65–99)
PHOSPHORUS: 3.7 mg/dL (ref 2.5–4.6)
POTASSIUM: 5.4 mmol/L — AB (ref 3.5–5.1)
Sodium: 137 mmol/L (ref 135–145)

## 2015-12-29 LAB — CBC
HEMATOCRIT: 36 % (ref 36.0–46.0)
HEMOGLOBIN: 11 g/dL — AB (ref 12.0–15.0)
MCH: 27.6 pg (ref 26.0–34.0)
MCHC: 30.6 g/dL (ref 30.0–36.0)
MCV: 90.2 fL (ref 78.0–100.0)
Platelets: 288 10*3/uL (ref 150–400)
RBC: 3.99 MIL/uL (ref 3.87–5.11)
RDW: 15.2 % (ref 11.5–15.5)
WBC: 8.1 10*3/uL (ref 4.0–10.5)

## 2015-12-29 LAB — GLUCOSE, CAPILLARY
GLUCOSE-CAPILLARY: 113 mg/dL — AB (ref 65–99)
Glucose-Capillary: 117 mg/dL — ABNORMAL HIGH (ref 65–99)
Glucose-Capillary: 157 mg/dL — ABNORMAL HIGH (ref 65–99)
Glucose-Capillary: 109 mg/dL — ABNORMAL HIGH (ref 65–99)

## 2015-12-29 MED ORDER — CEFTRIAXONE SODIUM 2 G IJ SOLR
2.0000 g | INTRAMUSCULAR | Status: AC
  Administered 2015-12-29 – 2016-01-01 (×4): 2 g via INTRAVENOUS
  Filled 2015-12-29 (×4): qty 2

## 2015-12-29 MED ORDER — METOPROLOL TARTRATE 25 MG PO TABS
25.0000 mg | ORAL_TABLET | Freq: Two times a day (BID) | ORAL | Status: DC
  Administered 2015-12-29 – 2016-01-01 (×6): 25 mg via ORAL
  Filled 2015-12-29 (×7): qty 1

## 2015-12-29 NOTE — Progress Notes (Signed)
[facility] and [facility] received the [facility] on 12/28/15. CM continuing to follow for d/c needs.

## 2015-12-29 NOTE — Progress Notes (Signed)
PULMONARY / CRITICAL CARE MEDICINE   Name: [name] MRN: [id number] DOB: [birth year]    ADMISSION DATE:  12/18/2015 CONSULTATION DATE:  12/20/2015  REFERRING MD:  Dr. Anne Fu  CHIEF COMPLAINT:  Short of breath  SUBJECTIVE: She can't tell that fluid is reaccummulating after thoracentesis yesterday.  VITAL SIGNS: BP 138/55 mmHg  Pulse 50  Temp(Src) 97.7 F (36.5 C) (Oral)  Resp 18  Ht  (1.6 m)  Wt 74.571 kg (164 lb 6.4 oz)  BMI 29.13 kg/m2  SpO2 100%  INTAKE / OUTPUT: I/O last 3 completed shifts: In: 1380 [P.O.:1380] Out: 300 [Urine:300]  PHYSICAL EXAMINATION: General:  Elderly female in NAD, alert, up in chair Neuro:  Normal strength, AAOx4 HEENT:  no stridor, jvd down Cardiovascular: s1 s2 RR irregular  Lungs:  Coarse at both bases, quieter R base Abdomen:  Soft, non tender Musculoskeletal:  No edema Skin:  No rashes or lesions  LABS:  BMET  Recent Labs Lab 12/27/15 0307 12/28/15 0505 12/29/15 0341  NA 134* 136 137  K 4.2 4.6 5.4*  CL 101 103 103  CO2 BUN 49* 47* 44*  CREATININE 1.64* 1.55* 1.77*  GLUCOSE 123* 117* 131*    Electrolytes  Recent Labs Lab 12/27/15 0307 12/28/15 0505 12/29/15 0341  CALCIUM 9.4 9.4 9.2  PHOS  --   --  3.7    CBC  Recent Labs Lab 12/25/15 0534 12/27/15 0307 12/29/15 0341  WBC 8.3 7.6 8.1  HGB 11.3* 10.7* 11.0*  HCT 35.8* 34.3* 36.0  PLT 306 286 288    Coag's  Recent Labs Lab 12/23/15 1607  INR 1.36    Sepsis Markers No results for input(s): LATICACIDVEN, PROCALCITON, O2SATVEN in the last 168 hours.  ABG No results for input(s): PHART, PCO2ART, PO2ART in the last 168 hours.  Liver Enzymes  Recent Labs Lab 12/26/15 1424 12/27/15 0307 12/28/15 0505 12/29/15 0341  AST --   ALT 11* 10* 10*  --   ALKPHOS 87 79 83  --   BILITOT 0.3 0.5 0.6  --   ALBUMIN 2.8* 2.6* 2.7* 2.7*    Cardiac Enzymes No results for input(s): TROPONINI, PROBNP in the last 168  hours.  Glucose  Recent Labs Lab 12/27/15 2133 12/28/15 0740 12/28/15 1157 12/28/15 1627 12/28/15 2115 12/29/15 0644  GLUCAP 95 124* 121* 115* 135* 117*    Imaging Dg Chest 1 View  12/28/2015  CLINICAL DATA:  Status post right thoracentesis EXAM: CHEST 1 VIEW COMPARISON:  Chest radiograph from earlier today. FINDINGS: Stable cardiomediastinal silhouette with mild-to-moderate cardiomegaly. No pneumothorax. Small residual right pleural effusion, significantly decreased. No left pleural effusion. No pulmonary edema. Improved aeration at the right lung base with residual mild right basilar atelectasis. IMPRESSION: 1. Small residual right pleural effusion, significantly decreased. No pneumothorax. 2. Improved right lung base aeration with residual mild right basilar atelectasis. 3. Stable mild-to-moderate cardiomegaly without pulmonary edema. Electronically Signed   By: Delbert Phenix [name]   On: 12/28/2015 14:50   Dg Chest 2 View  12/28/2015  CLINICAL DATA:  Shortness of breath EXAM: CHEST  2 VIEW COMPARISON:  12/26/2015 chest radiograph. FINDINGS: Surgical clips overlie the lower left axilla. Stable cardiomediastinal silhouette with mild-to-moderate cardiomegaly. No pneumothorax. Moderate right pleural effusion is increased. No left pleural effusion. No pulmonary edema. Worsening right basilar lung opacity, favor atelectasis. IMPRESSION: 1. Stable mild-to-moderate cardiomegaly without pulmonary edema. 2. Moderate right pleural effusion, increased. 3. Worsened right basilar lung  opacity, favor atelectasis. Electronically Signed   By: Delbert PhenixJason A Poff [name]   On: 12/28/2015 13:07   [place] Thoracentesis Asp Pleural Space W/img Guide  12/28/2015  CLINICAL DATA:  Recurrent pleural effusion, shortness of Breath EXAM: EXAM THORACENTESIS WITH ULTRASOUND TECHNIQUE: The procedure, risks (including but not limited to bleeding, infection, organ damage ), benefits, and alternatives were explained to the patient.  Questions regarding the procedure were encouraged and answered. The patient understands and consents to the procedure. Survey ultrasound of the right hemithorax was performed and an appropriate skin entry site was localized. Site was marked, prepped with Betadine, draped in usual sterile fashion, infiltrated locally with 1% lidocaine. The Safe-T-Centesis needle was advanced into the pleural space. Clear thin fluid returned. 880 mL was removed. Samples sent for requested laboratory studies. The patient tolerated procedure well. COMPLICATIONS: COMPLICATIONS None immediate IMPRESSION: Technically successful ultrasound-guided right thoracentesis. Follow-up chest radiograph pending. Electronically Signed   By: Corlis Leak  Hassell [name]   On: 12/28/2015 17:00     STUDIES:  5/19 Echo >> EF 55 to 60%, severe LVH  CULTURES: 5/18 Blood >> Pneumococcus 5/22 Blood >> strep pneumoniae >> sens rocephin  5/24 Pleural Fluid   Protein ratio >> 0.5  LDH ratio >> 0.8   Culture >>   Cytology >>neg  ANTIBIOTICS: 5/18 Zithromax >> 5/18 5/18 Rocephin >>  SIGNIFICANT EVENTS: 5/18  Admit 5/21  Bedside US >> not enough fluid to tap 5/24  Thoracentesis >> 1.4L removed    DISCUSSION: 82 y/o female with pneumococcal PNA with strep pneumoniae bacteremia and acute hypoxic respiratory failure.  She also has A fib with RVR, NSTEMI from demand ischemia.  She has hx of Stage III CKD s/p renal transplant, DM, chronic diastolic CHF, HLD  ASSESSMENT / PLAN:  PULMONARY A: Acute hypoxic respiratory failure 2nd to CAP. Large R pleural effusion - s/p thora on 5/24 with 1460 removed, clear yellow.  Exudate by LDH.  Suspect parapneumonic. ATX RLL resolved P:   Neg cytology Continued neg balance per renal Repeat CXR 5/30 for fluid status. Consider repeat tap or drain if coming back.   INFECTIOUS A:   Pneumococcal PNA with bacteremia. P:   Rocephin, complete 10 days total    CD Bessie Livingood, MD [name]: [phone]    After  3:00 PM call [phone] [facility] 12/29/2015 10:51 AM

## 2015-12-29 NOTE — Progress Notes (Signed)
Subjective: Interval History: has complaints just concerned about fluid.  Objective: Vital signs in last 24 hours: Temp:  [97.5 F (36.4 C)-97.7 F (36.5 C)] 97.7 F (36.5 C) (05/29 0544) Pulse Rate:  [47-69] 50 (05/29 0951) Resp:  [18-20] 18 (05/29 0951) BP: (110-138)/(53-61) 138/55 mmHg (05/29 0951) SpO2:  [99 %-100 %] 100 % (05/29 0951) FiO2 (%):  [28 %] 28 % (05/29 0834) Weight:  [74.571 kg (164 lb 6.4 oz)] 74.571 kg (164 lb 6.4 oz) (05/29 0544) Weight change: -0.726 kg (-1 lb 9.6 oz)  Intake/Output from previous day: 05/28 0701 - 05/29 0700 In: 1140 [P.O.:1140] Out: -  Intake/Output this shift: Total I/O In: 3 [I.V.:3] Out: 400 [Urine:400]  General appearance: alert, cooperative and no distress Resp: rales bibasilar and rhonchi bibasilar Cardio: S1, S2 normal and systolic murmur: holosystolic 2/6, blowing at apex GI: soft, pos bs, TX Extremities: pos bs,soft, TX RLQ  Lab Results:  Recent Labs  12/27/15 0307 12/29/15 0341  WBC 7.6 8.1  HGB 10.7* 11.0*  HCT 34.3* 36.0  PLT 286 288   BMET:  Recent Labs  12/28/15 0505 12/29/15 0341  NA 136 137  K 4.6 5.4*  CL 103 103  CO2 22 26  GLUCOSE 117* 131*  BUN 47* 44*  CREATININE 1.55* 1.77*  CALCIUM 9.4 9.2   No results for input(s): PTH in the last 72 hours. Iron Studies:  Recent Labs  12/28/15 1207  IRON 46  TIBC 255  FERRITIN 464*    Studies/Results: Dg Chest 1 View  12/28/2015  CLINICAL DATA:  Status post right thoracentesis EXAM: CHEST 1 VIEW COMPARISON:  Chest radiograph from earlier today. FINDINGS: Stable cardiomediastinal silhouette with mild-to-moderate cardiomegaly. No pneumothorax. Small residual right pleural effusion, significantly decreased. No left pleural effusion. No pulmonary edema. Improved aeration at the right lung base with residual mild right basilar atelectasis. IMPRESSION: 1. Small residual right pleural effusion, significantly decreased. No pneumothorax. 2. Improved right lung  base aeration with residual mild right basilar atelectasis. 3. Stable mild-to-moderate cardiomegaly without pulmonary edema. Electronically Signed   By: Delbert Phenix [name]   On: 12/28/2015 14:50   Dg Chest 2 View  12/28/2015  CLINICAL DATA:  Shortness of breath EXAM: CHEST  2 VIEW COMPARISON:  12/26/2015 chest radiograph. FINDINGS: Surgical clips overlie the lower left axilla. Stable cardiomediastinal silhouette with mild-to-moderate cardiomegaly. No pneumothorax. Moderate right pleural effusion is increased. No left pleural effusion. No pulmonary edema. Worsening right basilar lung opacity, favor atelectasis. IMPRESSION: 1. Stable mild-to-moderate cardiomegaly without pulmonary edema. 2. Moderate right pleural effusion, increased. 3. Worsened right basilar lung opacity, favor atelectasis. Electronically Signed   By: Delbert Phenix [name]   On: 12/28/2015 13:07   US Thoracentesis Asp Pleural Space W/img Guide  12/28/2015  CLINICAL DATA:  Recurrent pleural effusion, shortness of Breath EXAM: EXAM THORACENTESIS WITH ULTRASOUND TECHNIQUE: The procedure, risks (including but not limited to bleeding, infection, organ damage ), benefits, and alternatives were explained to the patient. Questions regarding the procedure were encouraged and answered. The patient understands and consents to the procedure. Survey ultrasound of the right hemithorax was performed and an appropriate skin entry site was localized. Site was marked, prepped with Betadine, draped in usual sterile fashion, infiltrated locally with 1% lidocaine. The Safe-T-Centesis needle was advanced into the pleural space. Clear thin fluid returned. 880 mL was removed. Samples sent for requested laboratory studies. The patient tolerated procedure well. COMPLICATIONS: COMPLICATIONS None immediate IMPRESSION: Technically successful ultrasound-guided right thoracentesis. Follow-up chest radiograph pending.  Electronically Signed   By: Corlis Leak  Hassell [name]   On: 12/28/2015  17:00    I have reviewed the patient's current medications.  Assessment/Plan: 1 Renal TX stable function.  Will cont diuresis. Check Prograf 2 Pneu with parapneumonic effusions 3 afb secondary to pneu 4 BP too low, lower Vit d 5 CAD P lower metop, give lasix, follow chem, check Prograf    LOS: 11 days   Naleah Kofoed L 12/29/2015,10:23 AM

## 2015-12-29 NOTE — Progress Notes (Signed)
Per pt, she had 2 L of fluid removed earlier this date, has strong/loose/productive cough, family @ bedside, flutter available if needed.

## 2015-12-29 NOTE — Progress Notes (Addendum)
PROGRESS NOTE                                                                                                                                                                                                             Patient Demographics:    [name], is a 82 y.o. female, DOB - 18-Oct-1931, FAO:[id number]  Admit date - 12/18/2015   Admitting Physician No admitting provider for patient encounter.  Outpatient Primary MD for the patient is No primary care provider on file.  LOS - 11  Outpatient Specialists: Nephrology cardiology  Chief Complaint  Patient presents with  . Shortness of Breath  . Chest Pain       Brief Narrative   82 y.o. female with a history of CAD, HTN, DM2, CKD s/p renal transplant in 2003, admitted on 5/18 after presenting with shortness of breath and chest pain, found to have A fib with RVR (CHADVASC 7), as well as NSTEMI from demand ischemia. Also had a pneumococcal pneumonia  Hospital course complicated due to development of acute hypoxic respiratory failure with pneumococcal pneumonia. Marland Kitchen. PCCM involving care. Nephrology consult and given worsened renal function. Patient transferred to hospitalist service for further care.Patient status post bedside thoracentesis on 5/24. Schedule for Lexiscan Myoview on 5/26   Subjective Breathing somewhat better compared to yesterday,  Hospital course    Acute respiratory failure with hypoxia secondary to pneumococcal pneumonia/Strep pneumo bacteremia and pleural effusion s/p thoracentesis bedside on 5/24 with 1460 removed, clear yellow. Likely exudative by LDH, repeat chest x-ray shows reexpansion of the right base, follow pleural fluid culture from 5/24, no growth from blood culture from 5/22. Last positive blood culture was on 5/18  given   IV Rocephin (10 day course)  starting 5/18  For  strep pneumococcal  bacteremia.  completed on 5/28. However patient  had reaccumulation of a parapneumonic effusion and had another 82680mL's of  pleural  fluid removed  On  5/28. Therefore antibiotics are being resumed.PCCM  Has been reconsulted  For further evaluation.   On 5/23 CT scan that showed a large right pleural effusion with right lower lobe collapse and patchy airspace disease of the left lower lobe, finding suspicion for aspiration pneumonia, endobronchial lesion causing collapse of the right lower lobe is also in the differential diagnosis  Patient still quite hypoxic requiring 2-3  L of oxygen with ambulation  PCCM  reconsulted to assess need for chest tube placement  Acute on Chronic kidney disease stage III-baseline around 1.4 Continue Prograf. Dosage being adjusted by nephrology. Prograf level on 5/25 was 8. Repeat Prograf level still pending. Creatinine 2.32>1.77, suspected to hemodynamically mediated with atrial fibrillation/rapid ventricular response vs ATN, approaching baseline Status post renal transplant in 2003 maintained on Tac/MMF  Continue to monitor Prograf levels, currently on Lasix 80 mg a day Monitor Prograf levels, goal 4-6   A. fib with RVR,CHA2DS2VASc = 7 Required Cardizem drip on admission. Now stable on oral Cardizem and metoprolol. 2-D echo showed normal EF. now in sinus rhythm. Treated with IV heparin and subsequently switched to Eliquis 6 and follow-up with Dr. Rennis Golden in one week. Dose of metoprolol reduced to 75 twice a day for due to bradycardia    NSTE MI,s/p RCA BMS in 2002 - residual LAD and Diag dzs Likely secondary to demand ischemia with rapid A. fib/pneumonia. given renal transplant h/o and risk of nephrotoxicity of contrast dye, decided to do Myoview instead Lexiscan Myoview 5/26 -showed inferior wall infarct with no evidence of reversible ischemia and EF of 52%   Acute on Chronic Diastolic CHF: BNP was abnormal at 1200 on admit. . Renal function fluctuating. She was on 80 mg PO lasix as an outpatient.  Home dose resumed. hold ARB. Strict I/Os.   diabetes mellitus type II-stable Continue sliding scale coverage. Hemoglobin A1c 6.9  physical deconditioning PT recommended home health-ordered           Code Status: full code Family Communication  :None at bedside Disposition Plan  : Completion of antibiotics and then discharge with home health on 5/28     Consults  :  Cardiology PCCM Renal   Procedures  :  2-D echo  DVT Prophylaxis  :  IV heparin  Lab Results  Component Value Date   PLT 288 12/29/2015    Antibiotics  :   Rocephin 5/18- Azithromycin 5/18-5/19      Objective:   Filed Vitals:   12/28/15 1414 12/28/15 2042 12/28/15 2119 12/29/15 0544  BP: 110/54  117/56 128/61  Pulse:   51 47  Temp:   97.7 F (36.5 C) 97.7 F (36.5 C)  TempSrc:   Oral Oral  Resp:   20 19  Height:      Weight:    74.571 kg (164 lb 6.4 oz)  SpO2:  100% 99% 99%    Wt Readings from Last 3 Encounters:  12/29/15 74.571 kg (164 lb 6.4 oz)  08/17/14 70.126 kg (154 lb 9.6 oz)  03/20/14 75.501 kg (166 lb 7.2 oz)     Intake/Output Summary (Last 24 hours) at 12/29/15 0809 Last data filed at 12/28/15 2240  Gross per 24 hour  Intake   1140 ml  Output      0 ml  Net   1140 ml     Physical Exam  Gen: not in distress, Appears fatigued  HEENT: no pallor, moist mucosa, supple neck chest: diminished right-sided breath sounds CVS: N S1&S2, no murmurs, rubs or gallop GI: soft, NT, ND, BS+ Musculoskeletal: warm, no edema CNS: AAOX3, non focal    Data Review:    CBC  Recent Labs Lab 12/23/15 0241 12/24/15 0320 12/25/15 0534 12/27/15 0307 12/29/15 0341  WBC 13.4* 12.2* 8.3 7.6 8.1  HGB 11.4* 11.2* 11.3* 10.7* 11.0*  HCT 35.0* 34.4* 35.8* 34.3* 36.0  PLT 280 305 306 286 288  MCV 86.4 86.4 87.5 88.6 90.2  MCH 28.1 28.1 27.6 27.6 27.6  MCHC 32.6 32.6 31.6 31.2 30.6  RDW 13.9 13.8 14.0 14.6 15.2    Chemistries   Recent Labs Lab 12/23/15 0241   12/25/15 0534 12/26/15 1424 12/27/15 0307 12/28/15 0505 12/29/15 0341  NA 131*  < > 134* 134* 134* 136 137  K 3.6  < > 4.4 3.8 4.2 4.6 5.4*  CL 98*  < > 101 102 101 103 103  CO2 20*  < > 23 22 22 22 26   GLUCOSE 119*  < > 110* 104* 123* 117* 131*  BUN 70*  < > 61* 49* 49* 47* 44*  CREATININE 1.29*  < > 1.72* 1.53* 1.64* 1.55* 1.77*  CALCIUM 9.2  < > 9.9 9.6 9.4 9.4 9.2  AST 18  --  16 19 16 16   --   ALT 11*  --  9* 11* 10* 10*  --   ALKPHOS 89  --  86 87 79 83  --   BILITOT 0.4  --  0.4 0.3 0.5 0.6  --   < > = values in this interval not displayed. ------------------------------------------------------------------------------------------------------------------ No results for input(s): CHOL, HDL, LDLCALC, TRIG, CHOLHDL, LDLDIRECT in the last 72 hours.  Lab Results  Component Value Date   HGBA1C 6.9* 12/18/2015   ------------------------------------------------------------------------------------------------------------------ No results for input(s): TSH, T4TOTAL, T3FREE, THYROIDAB in the last 72 hours.  Invalid input(s): FREET3 ------------------------------------------------------------------------------------------------------------------  Recent Labs  12/28/15 1207  FERRITIN 464*  TIBC 255  IRON 46    Coagulation profile  Recent Labs Lab 12/23/15 1607  INR 1.36    No results for input(s): DDIMER in the last 72 hours.  Cardiac Enzymes No results for input(s): CKMB, TROPONINI, MYOGLOBIN in the last 168 hours.  Invalid input(s): CK ------------------------------------------------------------------------------------------------------------------    Component Value Date/Time   BNP 1232.3* 12/18/2015 0830    Inpatient Medications  Scheduled Meds: . albuterol  2.5 mg Nebulization BID  . allopurinol  100 mg Oral QHS  . antiseptic oral rinse  7 mL Mouth Rinse BID  . apixaban  2.5 mg Oral BID  . aspirin EC  81 mg Oral Daily  . atorvastatin  10 mg Oral q1800   . cefTRIAXone (ROCEPHIN)  IV  2 g Intravenous Q24H  . cinacalcet  30 mg Oral Q supper  . diltiazem  240 mg Oral Daily  . famotidine  20 mg Oral Daily  . furosemide  80 mg Oral Daily  . guaiFENesin  1,200 mg Oral BID  . insulin aspart  0-15 Units Subcutaneous TID WC  . metoprolol  75 mg Oral BID  . mycophenolate  500 mg Oral BID  . sodium chloride flush  3 mL Intravenous Q12H  . tacrolimus  1 mg Oral QHS  . tacrolimus  1.5 mg Oral q morning - 10a   Continuous Infusions: . sodium chloride 10 mL/hr (12/24/15 0732)   PRN Meds:.sodium chloride, acetaminophen, albuterol, nitroGLYCERIN, ondansetron (ZOFRAN) IV, senna-docusate, sodium chloride flush, traMADol  Micro Results Recent Results (from the past 240 hour(s))  Culture, blood (Routine X 2) w Reflex to ID Panel     Status: None   Collection Time: 12/22/15 10:58 AM  Result Value Ref Range Status   Specimen Description BLOOD RIGHT HAND  Final   Special Requests IN PEDIATRIC BOTTLE  3CC  Final   Culture NO GROWTH 5 DAYS  Final   Report Status 12/27/2015 FINAL  Final  Culture, blood (Routine X 2)  w Reflex to ID Panel     Status: None   Collection Time: 12/22/15 11:00 AM  Result Value Ref Range Status   Specimen Description BLOOD RIGHT HAND  Final   Special Requests IN PEDIATRIC BOTTLE 3.0CC  Final   Culture NO GROWTH 5 DAYS  Final   Report Status 12/27/2015 FINAL  Final  Gram stain     Status: None   Collection Time: 12/24/15  9:01 PM  Result Value Ref Range Status   Specimen Description PLEURAL  Final   Special Requests Immunocompromised  Final   Gram Stain   Final    CYTOSPIN SMEAR WBC PRESENT,BOTH PMN AND MONONUCLEAR NO ORGANISMS SEEN    Report Status 12/25/2015 FINAL  Final  Culture, body fluid-bottle     Status: None (Preliminary result)   Collection Time: 12/24/15  9:01 PM  Result Value Ref Range Status   Specimen Description PLEURAL  Final   Special Requests AEROBIC BOTTLE ONLY  Final   Culture NO GROWTH 3 DAYS   Final   Report Status PENDING  Incomplete  Gram stain     Status: None   Collection Time: 12/28/15  2:30 PM  Result Value Ref Range Status   Specimen Description FLUID PLEURAL  Final   Special Requests NONE  Final   Gram Stain   Final    FEW WBC PRESENT,BOTH PMN AND MONONUCLEAR NO ORGANISMS SEEN    Report Status 12/28/2015 FINAL  Final    Radiology Reports Dg Chest 1 View  12/28/2015  CLINICAL DATA:  Status post right thoracentesis EXAM: CHEST 1 VIEW COMPARISON:  Chest radiograph from earlier today. FINDINGS: Stable cardiomediastinal silhouette with mild-to-moderate cardiomegaly. No pneumothorax. Small residual right pleural effusion, significantly decreased. No left pleural effusion. No pulmonary edema. Improved aeration at the right lung base with residual mild right basilar atelectasis. IMPRESSION: 1. Small residual right pleural effusion, significantly decreased. No pneumothorax. 2. Improved right lung base aeration with residual mild right basilar atelectasis. 3. Stable mild-to-moderate cardiomegaly without pulmonary edema. Electronically Signed   By: Delbert Phenix [name]   On: 12/28/2015 14:50   Dg Chest 2 View  12/28/2015  CLINICAL DATA:  Shortness of breath EXAM: CHEST  2 VIEW COMPARISON:  12/26/2015 chest radiograph. FINDINGS: Surgical clips overlie the lower left axilla. Stable cardiomediastinal silhouette with mild-to-moderate cardiomegaly. No pneumothorax. Moderate right pleural effusion is increased. No left pleural effusion. No pulmonary edema. Worsening right basilar lung opacity, favor atelectasis. IMPRESSION: 1. Stable mild-to-moderate cardiomegaly without pulmonary edema. 2. Moderate right pleural effusion, increased. 3. Worsened right basilar lung opacity, favor atelectasis. Electronically Signed   By: Delbert Phenix [name]   On: 12/28/2015 13:07   Dg Chest 2 View  12/26/2015  CLINICAL DATA:  SOB WITH EXERTION, PLEURAL EFFUSION,HX DM,CAD,KIDNEY TRANSPLANT EXAM: CHEST  2 VIEW  COMPARISON:  12/25/2015 FINDINGS: Heart is enlarged. There has been some improvement in aeration of right lung base. Small right pleural effusion persists. Left lung is clear. IMPRESSION: 1. Improved aeration at the right lung base. 2. Right pleural effusion. 3. Cardiomegaly. Electronically Signed   By: Norva Pavlov [name]   On: 12/26/2015 08:59   Ct Chest Wo Contrast  12/23/2015  CLINICAL DATA:  Shortness of breath. Pleural effusion. Congestive heart failure and diabetes. EXAM: CT CHEST WITHOUT CONTRAST TECHNIQUE: Multidetector CT imaging of the chest was performed following the standard protocol without IV contrast. COMPARISON:  Chest radiograph dated 12/23/2015 FINDINGS: Mediastinum/Lymph Nodes: No masses or pathologically enlarged lymph nodes identified on  this un-enhanced exam. The heart is enlarged. There is a small pericardial effusion. Atherosclerotic disease of the coronary arteries and thoracic aorta is noted. Layering material fills the esophagus dependently. Lungs/Pleura: There is a moderate to large water density right pleural effusion. There is right lower lobe airspace consolidation versus lobar atelectasis. There is patchy airspace consolidation in the basilar segments of the left lower lobe. Upper abdomen: No acute findings. Musculoskeletal: No chest wall mass or suspicious bone lesions identified. IMPRESSION: Large right pleural effusion with right lower lobe collapse. Patchy airspace disease within the dependent segments of the left lower lobe. Heterogeneous material filling the distal 2/3 of the esophagus. These findings raise the possibility of aspiration pneumonia. Endobronchial lesion causing collapse of the right lower lobe is also in the differential diagnosis, although one is not visualized on this nonenhanced exam. Alternatively the large right pleural effusion may be causing secondary compressive atelectasis of the right lower lobe. Enlarged cardiac silhouette. Atherosclerotic  disease of the coronary arteries and aorta. Electronically Signed   By: Ted Mcalpine [name]   On: 12/23/2015 14:41   Nm Myocar Multi W/spect W/wall Motion / Ef  12/26/2015  CLINICAL DATA:  Coronary artery disease, CHF, hypertension, diabetes, chest pain, shortness of Breath EXAM: MYOCARDIAL IMAGING WITH SPECT (REST AND PHARMACOLOGIC-STRESS) GATED LEFT VENTRICULAR WALL MOTION STUDY LEFT VENTRICULAR EJECTION FRACTION TECHNIQUE: Standard myocardial SPECT imaging was performed after resting intravenous injection of 10 mCi Tc-47m sestamibi. Subsequently, intravenous infusion of Lexiscan was performed under the supervision of the Cardiology staff. At peak effect of the drug, 30 mCi Tc-42m sestamibi was injected intravenously and standard myocardial SPECT imaging was performed. Quantitative gated imaging was also performed to evaluate left ventricular wall motion, and estimate left ventricular ejection fraction. COMPARISON:  None. FINDINGS: Perfusion: There is a small area of moderately decreased activity in the inferior wall of the left ventricular myocardium on both the resting and stress images. No suggestion of ischemia. Wall Motion: No left ventricular enlargement. There is decreased myocardial thickening in wall motion involving the septum and inferior wall of the left ventricle. Left Ventricular Ejection Fraction: 52 % End diastolic volume 94 ml End systolic volume 45 ml IMPRESSION: 1. Probable inferior wall infarct.  No evidence of ischemia. 2. Mild inferior wall and septal hypokinesis. 3. Left ventricular ejection fraction 52% 4. Low-risk stress test findings*. *2012 Appropriate Use Criteria for Coronary Revascularization Focused Update: J Am Coll Cardiol. 2012;59(9):857-881. http://content.dementiazones.com.aspx?articleid=[email or web address] Electronically Signed   By: Corlis Leak [name]   On: 12/26/2015 16:52   Dg Chest Port 1 View  12/25/2015  CLINICAL DATA:  Pleural effusion.  Thoracentesis. EXAM: PORTABLE  CHEST 1 VIEW COMPARISON:  12/24/2015.  CT 12/23/2015. FINDINGS: Mediastinum and hilar structures are stable. Stable cardiomegaly. Continued re-expansion of right base atelectasis. Right base infiltrate present. Small right pleural effusion. No pneumothorax. IMPRESSION: 1. Continued re-expansion of right base atelectasis. Right base infiltrates present. Small right pleural effusion. 2. Stable cardiomegaly.  No pulmonary venous congestion . Electronically Signed   By: Maisie Fus  Register   On: 12/25/2015 07:42   Dg Chest Port 1 View  12/24/2015  CLINICAL DATA:  Status post right thoracentesis EXAM: PORTABLE CHEST 1 VIEW COMPARISON:  Earlier today FINDINGS: Decreased right pleural effusion, trace to small currently. No pneumothorax or re-expansion edema. Patchy opacity in the right base along its is presumably incomplete resolution of atelectasis given findings of right middle and lower lobe collapse on chest CT 12/23/2015. The left lung is clear. Stable aortic contours. IMPRESSION:  1. No acute finding after right thoracentesis. There is minimal residual fluid. 2. Partial re-expansion of the collapsed right middle and lower lobe seen on CT yesterday. Electronically Signed   By: Marnee Spring [name]   On: 12/24/2015 13:59   Dg Chest Port 1 View  12/24/2015  CLINICAL DATA:  Shortness of breath.  Pleural effusion . EXAM: PORTABLE CHEST 1 VIEW COMPARISON:  CT 12/23/2015.  Chest x-ray 12/23/2015. FINDINGS: Mediastinum hilar structures are stable. Stable cardiomegaly. Stable right lower lobe atelectasis and/or consolidation and right pleural effusion. Left lung is clear. No pneumothorax. IMPRESSION: 1. Stable right lower lobe atelectasis and/or consolidation and right pleural effusion. 2. Stable cardiomegaly. Electronically Signed   By: Maisie Fus  Register   On: 12/24/2015 07:11   Dg Chest Port 1 View  12/23/2015  CLINICAL DATA:  Pneumonia. EXAM: PORTABLE CHEST 1 VIEW COMPARISON:  12/22/2015. FINDINGS: Stable  cardiomegaly. Progressive infiltrate right lower lobe. Progressive right pleural effusion. Mild left base subsegmental atelectasis and or infiltrate. Left hemidiaphragm not imaged. No pneumothorax. IMPRESSION: 1. Progressive right lower lobe infiltrate and right pleural effusion. 2.  Left base subsegmental atelectasis and or mild infiltrate. 3.  Cardiomegaly.  No pulmonary venous congestion . Electronically Signed   By: Maisie Fus  Register   On: 12/23/2015 07:18   Dg Chest Port 1 View  12/22/2015  CLINICAL DATA:  Continued assessment of pleural effusion. EXAM: PORTABLE CHEST 1 VIEW COMPARISON:  12/21/2015. FINDINGS: Cardiomegaly. Large RIGHT pleural effusion is increased. Compressive atelectasis at the RIGHT base. Trace LEFT effusion may be improved. Overall improved vascular congestion. IMPRESSION: Increasing RIGHT pleural effusion otherwise improved exam. Electronically Signed   By: Elsie Stain [name]   On: 12/22/2015 07:27   Dg Chest Port 1 View  12/21/2015  CLINICAL DATA:  Community-acquired pneumonia EXAM: PORTABLE CHEST 1 VIEW COMPARISON:  Chest radiograph from one day prior. FINDINGS: Stable cardiomediastinal silhouette with mild cardiomegaly. No pneumothorax. Moderate right and small left pleural effusions, increased bilaterally. Mild pulmonary edema, increased. Patchy bibasilar lung consolidation, right greater than left, slightly worsened bilaterally. IMPRESSION: 1. Patchy bibasilar lung consolidation, right greater than left, slightly worsened bilaterally, likely a combination of atelectasis and pneumonia. 2. Stable mild cardiomegaly with increased mild pulmonary edema. 3. Moderate right and small left pleural effusions, increased bilaterally. Electronically Signed   By: Delbert Phenix [name]   On: 12/21/2015 08:39   Dg Chest Port 1 View  12/20/2015  CLINICAL DATA:  Shortness of breath EXAM: PORTABLE CHEST 1 VIEW COMPARISON:  12/18/2015 FINDINGS: New right lower lung opacity, likely combination of  atelectasis/ pneumonia and layering pleural effusion. Mild left basilar opacity, possibly atelectasis. Cardiomegaly.  No frank interstitial edema. No pneumothorax. IMPRESSION: New right lower lung opacity, likely combination of atelectasis/ pneumonia and layering pleural effusion. Mild left basilar opacity, possibly atelectasis. Electronically Signed   By: Charline Bills [name]   On: 12/20/2015 11:53   Dg Chest Port 1 View  12/18/2015  CLINICAL DATA:  Productive cough for 2 weeks, shortness of breath, headache EXAM: PORTABLE CHEST 1 VIEW COMPARISON:  08/11/2014 FINDINGS: Borderline cardiomegaly. No pulmonary edema. There is streaky left base retrocardiac atelectasis or early infiltrate. Atherosclerotic but for sclerotic calcifications of thoracic aorta. IMPRESSION: Streaky left base retrocardiac atelectasis or early infiltrate. No pulmonary edema. Follow-up to resolution recommended. Electronically Signed   By: Natasha Mead [name]   On: 12/18/2015 09:38   US Thoracentesis Asp Pleural Space W/img Guide  12/28/2015  CLINICAL DATA:  Recurrent pleural effusion, shortness of Breath  EXAM: EXAM THORACENTESIS WITH ULTRASOUND TECHNIQUE: The procedure, risks (including but not limited to bleeding, infection, organ damage ), benefits, and alternatives were explained to the patient. Questions regarding the procedure were encouraged and answered. The patient understands and consents to the procedure. Survey ultrasound of the right hemithorax was performed and an appropriate skin entry site was localized. Site was marked, prepped with Betadine, draped in usual sterile fashion, infiltrated locally with 1% lidocaine. The Safe-T-Centesis needle was advanced into the pleural space. Clear thin fluid returned. 880 mL was removed. Samples sent for requested laboratory studies. The patient tolerated procedure well. COMPLICATIONS: COMPLICATIONS None immediate IMPRESSION: Technically successful ultrasound-guided right thoracentesis.  Follow-up chest radiograph pending. Electronically Signed   By: Corlis Leak [name]   On: 12/28/2015 17:00    Time Spent in minutes  35.   Richarda Overlie M.D on 12/29/2015 at 8:09 AM  Between 7am to 7pm - Pager - [phone]  After 7pm go to [email or web address] - password [facility]  [facility] -  Office  [phone]

## 2015-12-29 NOTE — Care Management Note (Addendum)
Case Management Note  Patient Details  Name: [name] MRN: [id number] Date of Birth: 10/09/1931  Subjective/Objective:      Patient presented to [facility] ED with c/o SOB, cough              Action/Plan: CM received consult met with patient to discuss transitional care planning and recommendations. Patient lives with granddaughter, denies any difficulty obtaining monthly medications, or transportation needs. CM Discussed recommendations for [facility] services and home oxygen needs. Patient is agreeable with recommendations. Offered choice patient selected [facility] for [facility] services. Referral called in to [place] at [facility] for [facility] and home Oxygen. Explained to patient that a oxygen tank will be delivered to room prior to discharge home and a oxygen concentrator will be delivered home upon discharge. Patient verbalized understanding, teach back done. Patient states, she has no further questions or concerns at this time.  Also discussed the benefits of [facility] patient is agreeable referral placed as well.  Discussed the need and  importance of PCP, patient is agreeable and reports that she was previously  a patient at [facility] prefers to follow up at the [facility], [place] will speak unit secretary regarding scheduling follow up appt at [facility].  Patient is being discharged on Eliquis, CM provided 30 day free trial card. No further CM needs identified.  Expected Discharge Date:                  Expected Discharge Plan:  [place]  In-House Referral:     Discharge planning Services  CM Consult  Post Acute Care Choice:    Choice offered to:  Patient  DME Arranged:  Oxygen DME Agency:  [place]:  PT, OT, Nurse's Aide [place] Agency:  [place]  Status of Service:  Completed, signed off  Medicare Important Message Given:  Yes Date Medicare IM Given:    Medicare IM give by:    Date Additional Medicare IM Given:    Additional Medicare Important  Message give by:     If discussed at [place] of Stay Meetings, dates discussed:    Additional CommentsLaurena Slimmer, RN 12/29/2015, 9:22 AM

## 2015-12-29 NOTE — Progress Notes (Signed)
Physical Therapy Treatment Patient Details Name: Joy [name] MRN: [id number] DOB: [birth year] Today's Date: 12/29/2015    History of Present Illness [name] is a 82 y.o. female with a Past Medical History of FSGS status post transplant, CAD status post cardiac catheterization, HTN, cholelithiasis, CHF, gout, C KD, DM who presents with A. fib with RVR and evidence of demand ischemia.    PT Comments    Pt tolerated increased distance of 7275' with ambulation, decreased dyspnea noted with mobility. Progressing well, ready to DC home from PT standpoint. Unable to obtain ambulating SaO2  as pulse ox did not get a reading.   Follow Up Recommendations  Home health PT;Supervision - Intermittent     Equipment Recommendations  None recommended by PT    Recommendations for Other Services       Precautions / Restrictions Precautions Precautions: Fall Precaution Comments: O2 dependent Restrictions Weight Bearing Restrictions: No    Mobility  Bed Mobility               General bed mobility comments: NT- up on EOB  Transfers Overall transfer level: Modified independent Equipment used: Rolling walker (2 wheeled)   Sit to Stand: Modified independent (Device/Increase time)         General transfer comment: demo'd safe technique, no assist or cues needed.   Ambulation/Gait Ambulation/Gait assistance: Supervision Ambulation Distance (Feet): 75 Feet Assistive device: Rolling walker (2 wheeled) Gait Pattern/deviations: Step-through pattern Gait velocity: decreased Gait velocity interpretation: Below normal speed for age/gender General Gait Details: pt ambulated with 2L O2, pulse ox would not give a reading for ambulating O2 levels, 1/4 dyspnea with activity, no LOB   Stairs            Wheelchair Mobility    Modified Rankin (Stroke Patients Only)       Balance     Sitting balance-Leahy Scale: Good       Standing balance-Leahy Scale: Fair                       Cognition Arousal/Alertness: Awake/alert Behavior During Therapy: WFL for tasks assessed/performed Overall Cognitive Status: Within Functional Limits for tasks assessed                      Exercises      General Comments        Pertinent Vitals/Pain Pain Assessment: No/denies pain    Home Living                      Prior Function            PT Goals (current goals can now be found in the care plan section) Acute Rehab PT Goals Patient Stated Goal: To go home  PT Goal Formulation: With patient Time For Goal Achievement: 12/31/15 Potential to Achieve Goals: Fair Progress towards PT goals: Progressing toward goals    Frequency  Min 3X/week    PT Plan Current plan remains appropriate    Co-evaluation             End of Session Equipment Utilized During Treatment: Oxygen;Gait belt Activity Tolerance: Patient tolerated treatment well;Patient limited by fatigue Patient left: with call bell/phone within reach;in chair     Time: [id number] PT Time Calculation (min) (ACUTE ONLY): 24 min  Charges:  $Gait Training: 8-22 mins  G Codes:      [name] 12/29/2015, 10:20 AM [phone]

## 2015-12-30 ENCOUNTER — Inpatient Hospital Stay (HOSPITAL_COMMUNITY): Payer: Medicare Other

## 2015-12-30 ENCOUNTER — Other Ambulatory Visit: Payer: Self-pay

## 2015-12-30 LAB — COMPREHENSIVE METABOLIC PANEL
ALT: 11 U/L — ABNORMAL LOW (ref 14–54)
AST: 18 U/L (ref 15–41)
Albumin: 2.7 g/dL — ABNORMAL LOW (ref 3.5–5.0)
Alkaline Phosphatase: 80 U/L (ref 38–126)
Anion gap: 9 (ref 5–15)
BUN: 41 mg/dL — AB (ref 6–20)
CHLORIDE: 104 mmol/L (ref 101–111)
CO2: 23 mmol/L (ref 22–32)
Calcium: 8.7 mg/dL — ABNORMAL LOW (ref 8.9–10.3)
Creatinine, Ser: 1.54 mg/dL — ABNORMAL HIGH (ref 0.44–1.00)
GFR, EST AFRICAN AMERICAN: 35 mL/min — AB (ref >60)
GFR, EST NON AFRICAN AMERICAN: 30 mL/min — AB (ref >60)
Glucose, Bld: 103 mg/dL — ABNORMAL HIGH (ref 65–99)
POTASSIUM: 5.1 mmol/L (ref 3.5–5.1)
Sodium: 136 mmol/L (ref 135–145)
Total Bilirubin: 0.5 mg/dL (ref 0.3–1.2)
Total Protein: 5.7 g/dL — ABNORMAL LOW (ref 6.5–8.1)

## 2015-12-30 LAB — CBC
HCT: 35.2 % — ABNORMAL LOW (ref 36.0–46.0)
Hemoglobin: 10.8 g/dL — ABNORMAL LOW (ref 12.0–15.0)
MCH: 27.8 pg (ref 26.0–34.0)
MCHC: 30.7 g/dL (ref 30.0–36.0)
MCV: 90.5 fL (ref 78.0–100.0)
PLATELETS: 279 10*3/uL (ref 150–400)
RBC: 3.89 MIL/uL (ref 3.87–5.11)
RDW: 15.3 % (ref 11.5–15.5)
WBC: 8.3 10*3/uL (ref 4.0–10.5)

## 2015-12-30 LAB — CULTURE, BODY FLUID W GRAM STAIN -BOTTLE: Culture: NO GROWTH

## 2015-12-30 LAB — PH, BODY FLUID: pH, Body Fluid: 7.6

## 2015-12-30 LAB — GLUCOSE, CAPILLARY
GLUCOSE-CAPILLARY: 108 mg/dL — AB (ref 65–99)
GLUCOSE-CAPILLARY: 138 mg/dL — AB (ref 65–99)
GLUCOSE-CAPILLARY: 93 mg/dL (ref 65–99)
GLUCOSE-CAPILLARY: 123 mg/dL — AB (ref 65–99)

## 2015-12-30 LAB — PATHOLOGIST SMEAR REVIEW

## 2015-12-30 NOTE — Progress Notes (Signed)
CARDIAC REHAB PHASE I   PRE:  Rate/Rhythm: 54 SB  BP:  Sitting: 144/95        SaO2: 100 3L  MODE:  Ambulation: 72 ft   POST:  Rate/Rhythm: 59 SB  BP:  Sitting: 138/58         SaO2: 94 2L   Pt ambulated 72 ft on 2L O2, rolling walker, gait belt, assist x2, slow, fairly steady gait, tolerated fairly well. Pt limited by fatigue, c/o mild DOE, declined rest stop. Pt to recliner after walk, call bell within reach. Will follow as x1.   [id number] Joy GrapesEmily C Vanessia Bokhari, RN, BSN 12/30/2015 3:25 PM

## 2015-12-30 NOTE — Progress Notes (Signed)
CARDIAC REHAB PHASE I   Pt in bed, states she's had a "busy morning," states she just returned to bed, declines ambulation at this time. Pt requests for cardiac rehab to return after 1400 to ambulate. Emphasized importance of ambulation. Will follow up as schedule permits.   Joylene GrapesEmily C Prather Failla, RN, BSN 12/30/2015 11:36 AM

## 2015-12-30 NOTE — Progress Notes (Signed)
PULMONARY / CRITICAL CARE MEDICINE   Name: [name] MRN: [id number] DOB: 03-12-1932    ADMISSION DATE:  12/18/2015 CONSULTATION DATE:  12/20/2015  REFERRING MD:  Dr. Anne Fu  CHIEF COMPLAINT:  Short of breath  SUBJECTIVE: no acute events overnight, no complaints  VITAL SIGNS: BP 130/60 mmHg  Pulse 49  Temp(Src) 98.1 F (36.7 C) (Oral)  Resp 18  Ht  (1.6 m)  Wt 74.254 kg (163 lb 11.2 oz)  BMI 29.01 kg/m2  SpO2 99%  INTAKE / OUTPUT: I/O last 3 completed shifts: In: 1283 [P.O.:1280; I.V.:3] Out: 1200 [Urine:1200]  PHYSICAL EXAMINATION:  General:  Elderly female in NAD, alert, up in chair Neuro:  Normal strength, AAOx4 HEENT:  no stridor, NCAT, PERRL Cardiovascular: s1 s2 RR irregular  Lungs:  Coarse at both bases, diminished halfway up R posterior lung field  Abdomen:  Soft, non tender Musculoskeletal:  No edema Skin:  No rashes or lesions  LABS:  BMET  Recent Labs Lab 12/28/15 0505 12/29/15 0341 12/30/15 0301  NA 136 137 136  K 4.6 5.4* 5.1  CL 103 103 104  CO2 BUN 47* 44* 41*  CREATININE 1.55* 1.77* 1.54*  GLUCOSE 117* 131* 103*    Electrolytes  Recent Labs Lab 12/28/15 0505 12/29/15 0341 12/30/15 0301  CALCIUM 9.4 9.2 8.7*  PHOS  --  3.7  --     CBC  Recent Labs Lab 12/27/15 0307 12/29/15 0341 12/30/15 0301  WBC 7.6 8.1 8.3  HGB 10.7* 11.0* 10.8*  HCT 34.3* 36.0 35.2*  PLT 286 288 279    Coag's  Recent Labs Lab 12/23/15 1607  INR 1.36    Sepsis Markers No results for input(s): LATICACIDVEN, PROCALCITON, O2SATVEN in the last 168 hours.  ABG No results for input(s): PHART, PCO2ART, PO2ART in the last 168 hours.  Liver Enzymes  Recent Labs Lab 12/27/15 0307 12/28/15 0505 12/29/15 0341 12/30/15 0301  AST 16 16  --  18  ALT 10* 10*  --  11*  ALKPHOS 79 83  --  80  BILITOT 0.5 0.6  --  0.5  ALBUMIN 2.6* 2.7* 2.7* 2.7*    Cardiac Enzymes No results for input(s): TROPONINI, PROBNP in the last 168  hours.  Glucose  Recent Labs Lab 12/28/15 2115 12/29/15 0644 12/29/15 1153 12/29/15 1636 12/29/15 2050 12/30/15 0547  GLUCAP 135* 117* 157* 109* 113* 108*    Imaging Dg Chest 1 View  12/30/2015  CLINICAL DATA:  Follow-up pleural effusion, history of multiple thoracentesis ease, history of CHF, coronary artery disease and renal transplantation EXAM: CHEST 1 VIEW COMPARISON:  Chest x-ray of Dec 28, 2015 FINDINGS: There is a small right pleural effusion which appears larger than on the previous study. There is no mediastinal shift. There is no significant left pleural effusion. The cardiac silhouette remains enlarged. The central pulmonary vascularity remains prominent. The trachea is midline. There are degenerative changes of both shoulders. IMPRESSION: Small right pleural effusion which has increased in size since the study of 2 days ago. There is stable cardiomegaly without pulmonary edema. Electronically Signed   By: David  [place]il[place] [name]   On: 12/30/2015 08:23     STUDIES:  5/19 Echo >> EF 55 to 60%, severe LVH  CULTURES: 5/18 Blood >> Pneumococcus 5/22 Blood >> strep pneumoniae >> sens rocephin  5/24 Pleural Fluid   Protein ratio >> 0.5  LDH ratio >> 0.8   Culture >>   Cytology >>neg  ANTIBIOTICS: 5/18  Zithromax >> 5/18 5/18 Rocephin >>  SIGNIFICANT EVENTS: 5/18 Admit 5/21 Bedside US >> not enough fluid to tap 5/24 Thoracentesis >> 1.4L removed 5/28 Thoracentesis with 800mL removed 5/30 CXR with recurrence of effusion  DISCUSSION: 82 y/o female with pneumococcal PNA with strep pneumoniae bacteremia and acute hypoxic respiratory failure.  She also has A fib with RVR, NSTEMI from demand ischemia.   She has hx of Stage III CKD s/p renal transplant, DM, chronic diastolic CHF, HLD  ASSESSMENT / PLAN:  Large R pleural effusion - s/p thora on 5/24 with 1460 removed, clear yellow.  Exudate by LDH.  Suspect parapneumonic. Repeat tap 5/28 with 800cc removed. CXR on 5/30 with  re-accumulation.  -Continued neg balance per renal -Will need repeat thoracentesis vs CVTS consult and chest tube drainage, will discuss with attending  Acute hypoxic respiratory failure 2nd to CAP. Pneumococcal PNA with bacteremia. - Supplemental O2 as needed to maintatin SpO2 > 92% - ABX per primary  Joneen RoachPaul Hoffman, AGACNP-BC [facility] Pager [phone] or [phone]  12/30/2015 11:25 AM      ATTENDING NOTE / ATTESTATION NOTE :   I have discussed the case with the resident/APP Joneen RoachPaul Hoffman.  I agree with the resident/APP's  history, physical examination, assessment, and plans.  I have edited the above note and modified it according to our agreed history, physical examination, assessment and plan.   Pt feels clinically improved. Walked some today and was not too SOB.  Pt with recurrent pleural effusion. Now with small R effusion.  VSS. Afebrile. Some bibasilar crackles. Gr 1 edema. CXR with R small efussion.  Cultures (-) (recent.) Suggest to keep pt (-) fluid balance. Cont with diuresis.  Cont abx as well.  Effusion now might be related to (+) fluid balance.    Family : No family at bedside.    Pollie MeyerJ. Angelo A de Dios, MD 12/30/2015, 3:40 PM [place] [facility] Pager [phone] After 3 pm or if no answer, call [phone]

## 2015-12-30 NOTE — Progress Notes (Signed)
Pt a/o, no c/o pain, pt resting in bed comfortably, VSS, pt stable

## 2015-12-30 NOTE — Progress Notes (Signed)
Subjective: Interval History: has no complaint.  Objective: Vital signs in last 24 hours: Temp:  [97.5 F (36.4 C)-98.2 F (36.8 C)] 97.5 F (36.4 C) (05/30 1610) Pulse Rate:  [44-65] 65 (05/30 0608) Resp:  [18] 18 (05/30 0608) BP: (117-138)/(45-57) 121/45 mmHg (05/30 0608) SpO2:  [98 %-100 %] 98 % (05/30 0727) Weight:  [74.254 kg (163 lb 11.2 oz)] 74.254 kg (163 lb 11.2 oz) (05/30 0425) Weight change: -0.318 kg (-11.2 oz)  Intake/Output from previous day: 05/29 0701 - 05/30 0700 In: 583 [P.O.:580; I.V.:3] Out: 1200 [Urine:1200] Intake/Output this shift: Total I/O In: 240 [P.O.:240] Out: -   General appearance: alert, cooperative and no distress Resp: rales bibasilar, rhonchi RLL and rubs RLL Cardio: S1, S2 normal and systolic murmur: holosystolic 2/6, blowing at apex GI: soft,pos bs, liver down 5 cm Extremities: extremities normal, atraumatic, no cyanosis or edema  Renal TX RLQ  Abdm striae Lab Results:  Recent Labs  12/29/15 0341 12/30/15 0301  WBC 8.1 8.3  HGB 11.0* 10.8*  HCT 36.0 35.2*  PLT 288 279   BMET:  Recent Labs  12/29/15 0341 12/30/15 0301  NA 137 136  K 5.4* 5.1  CL 103 104  CO2 26 23  GLUCOSE 131* 103*  BUN 44* 41*  CREATININE 1.77* 1.54*  CALCIUM 9.2 8.7*   No results for input(s): PTH in the last 72 hours. Iron Studies:  Recent Labs  12/28/15 1207  IRON 46  TIBC 255  FERRITIN 464*    Studies/Results: Dg Chest 1 View  12/30/2015  CLINICAL DATA:  Follow-up pleural effusion, history of multiple thoracentesis ease, history of CHF, coronary artery disease and renal transplantation EXAM: CHEST 1 VIEW COMPARISON:  Chest x-ray of Dec 28, 2015 FINDINGS: There is a small right pleural effusion which appears larger than on the previous study. There is no mediastinal shift. There is no significant left pleural effusion. The cardiac silhouette remains enlarged. The central pulmonary vascularity remains prominent. The trachea is midline. There  are degenerative changes of both shoulders. IMPRESSION: Small right pleural effusion which has increased in size since the study of 2 days ago. There is stable cardiomegaly without pulmonary edema. Electronically Signed   By: David  [place]and [name]   On: 12/30/2015 08:23   Dg Chest 1 View  12/28/2015  CLINICAL DATA:  Status post right thoracentesis EXAM: CHEST 1 VIEW COMPARISON:  Chest radiograph from earlier today. FINDINGS: Stable cardiomediastinal silhouette with mild-to-moderate cardiomegaly. No pneumothorax. Small residual right pleural effusion, significantly decreased. No left pleural effusion. No pulmonary edema. Improved aeration at the right lung base with residual mild right basilar atelectasis. IMPRESSION: 1. Small residual right pleural effusion, significantly decreased. No pneumothorax. 2. Improved right lung base aeration with residual mild right basilar atelectasis. 3. Stable mild-to-moderate cardiomegaly without pulmonary edema. Electronically Signed   By: Delbert Phenix [name]   On: 12/28/2015 14:50   Dg Chest 2 View  12/28/2015  CLINICAL DATA:  Shortness of breath EXAM: CHEST  2 VIEW COMPARISON:  12/26/2015 chest radiograph. FINDINGS: Surgical clips overlie the lower left axilla. Stable cardiomediastinal silhouette with mild-to-moderate cardiomegaly. No pneumothorax. Moderate right pleural effusion is increased. No left pleural effusion. No pulmonary edema. Worsening right basilar lung opacity, favor atelectasis. IMPRESSION: 1. Stable mild-to-moderate cardiomegaly without pulmonary edema. 2. Moderate right pleural effusion, increased. 3. Worsened right basilar lung opacity, favor atelectasis. Electronically Signed   By: Delbert Phenix [name]   On: 12/28/2015 13:07   US Thoracentesis Asp Pleural Space W/img Guide  12/28/2015  CLINICAL DATA:  Recurrent pleural effusion, shortness of Breath EXAM: EXAM THORACENTESIS WITH ULTRASOUND TECHNIQUE: The procedure, risks (including but not limited to bleeding,  infection, organ damage ), benefits, and alternatives were explained to the patient. Questions regarding the procedure were encouraged and answered. The patient understands and consents to the procedure. Survey ultrasound of the right hemithorax was performed and an appropriate skin entry site was localized. Site was marked, prepped with Betadine, draped in usual sterile fashion, infiltrated locally with 1% lidocaine. The Safe-T-Centesis needle was advanced into the pleural space. Clear thin fluid returned. 880 mL was removed. Samples sent for requested laboratory studies. The patient tolerated procedure well. COMPLICATIONS: COMPLICATIONS None immediate IMPRESSION: Technically successful ultrasound-guided right thoracentesis. Follow-up chest radiograph pending. Electronically Signed   By: Corlis Leak  Hassell [name]   On: 12/28/2015 17:00    I have reviewed the patient's current medications.  Assessment/Plan: 1 Renal Tx with CKD stable.  Diuresing and will cont with  Goal 10-12 lb 2 Pneu with parapneumonic effusion 3 Afib secondary to #2 now NSR 4 Debill 5 CAD P cont lasix, will s/o for now and see again at your request. F/U with Dr. Briant CedarMattingly in 2-3 wk    LOS: 12 days   Brynnan Rodenbaugh L 12/30/2015,9:05 AM

## 2015-12-30 NOTE — Consult Note (Signed)
   [facility]   12/30/2015  [name] 25-Jun-1932 [id number]  Referral received.  Patient evaluated for community based chronic disease management services with [facility] as a benefit of patient's [employer].  Patient endorses that Dr. Blanch MediaElizabeth [name] as her primary care provider.  [facility].  HX of kidney transplant.  Admitted with Pneumonia and HF exacerbation.  Has her home [phone] Spoke with patient at bedside to explain [facility].  Patient will receive post hospital discharge call and will be evaluated for monthly home visits for assessments and disease process education. Encouraged patient to work with Home Health as the patient states she does not want to 'go' to a rehab facility.   Left contact information and [facility] literature at bedside. Made Inpatient Case Manager aware that [facility]. Of note, [facility] services does not replace or interfere with any services that are arranged by inpatient case management or social work.  For additional questions or referrals please contact:     Charlesetta ShanksVictoria Clarise Chacko, RN BSN CCM [facility], [facility] Liaison  [phone] business mobile phone Toll free office [phone]

## 2015-12-30 NOTE — Progress Notes (Signed)
PROGRESS NOTE                                                                                                                                                                                                             Patient Demographics:    [name], is a 82 y.o. female, DOB - May 23, 1932, ZOX:[id number]  Admit date - 12/18/2015   Admitting Physician No admitting provider for patient encounter.  Outpatient Primary MD for the patient is Blanch Media, MD  LOS - 12  Outpatient Specialists: Nephrology cardiology  Chief Complaint  Patient presents with  . Shortness of Breath  . Chest Pain       Brief Narrative   82 y.o. female with a history of CAD, HTN, DM2, CKD s/p renal transplant in 2003, admitted on 5/18 after presenting with shortness of breath and chest pain, found to have A fib with RVR (CHADVASC 7), as well as NSTEMI from demand ischemia. Also had a pneumococcal pneumonia  Hospital course complicated due to development of acute hypoxic respiratory failure with pneumococcal pneumonia. [facility] PCCM involving care. Nephrology consult and given worsened renal function. Patient transferred to hospitalist service for further care.Patient status post bedside thoracentesis on 5/24. Schedule for Lexiscan Myoview on 5/26   Subjective Very deconditioning short of breath with minimal exertion  Hospital course    Acute respiratory failure with hypoxia secondary to pneumococcal pneumonia/Strep pneumo bacteremia and pleural effusion s/p thoracentesis bedside on 5/24 with 1460 removed, clear yellow. Likely exudative by LDH, repeat chest x-ray shows reexpansion of the right base, follow pleural fluid culture from 5/24, no growth from blood culture from 5/22. Last positive blood culture was on 5/18  given   IV Rocephin (10 day course)  starting 5/18  For  strep pneumococcal  bacteremia.  completed on 5/28. However patient had  reaccumulation of a parapneumonic effusion and had another 871mL's of  pleural  fluid removed  On  5/28. Therefore antibiotics are being resumed.PCCM  Has been reconsulted  For further evaluation. He recommended a repeat chest x-ray that shows slight increase in the right-sided pleural effusion.  CT scan on 5/23 showed  showed a large right pleural effusion with right lower lobe collapse and patchy airspace disease of the left lower lobe, finding suspicion for aspiration pneumonia, endobronchial lesion causing collapse of the right lower lobe  is also in the differential diagnosis  Patient still quite hypoxic requiring 2-3 L of oxygen with ambulation  PCCM to make final recommendations regarding recurrent pleural effusion  Acute on Chronic kidney disease stage III-baseline around 1.4 Continue Prograf. Dosage being adjusted by nephrology. Prograf level on 5/25 was 8. Repeat Prograf level still pending. Creatinine 2.32>1.54, suspected to hemodynamically mediated with atrial fibrillation/rapid ventricular response vs ATN, approaching baseline Status post renal transplant in 2003 maintained on Tac/MMF  Continue to monitor Prograf levels, currently on Lasix 80 mg a day Monitor Prograf levels, goal 4-6   A. fib with RVR,CHA2DS2VASc = 7 Required Cardizem drip on admission. Now stable on oral Cardizem and metoprolol. 2-D echo showed normal EF. now in sinus rhythm. Treated with IV heparin and subsequently switched to Eliquis   and follow-up with Dr. Rennis Golden in one week. Dose of metoprolol reduced to 25 twice a day for due to bradycardia    NSTE MI,s/p RCA BMS in 2002 - residual LAD and Diag dzs Likely secondary to demand ischemia with rapid A. fib/pneumonia. given renal transplant h/o and risk of nephrotoxicity of contrast dye, decided to do Myoview instead Lexiscan Myoview 5/26 -showed inferior wall infarct with no evidence of reversible ischemia and EF of 52%   Acute on Chronic Diastolic CHF:  BNP was abnormal at 1200 on admit. . Renal function fluctuating. She was on 80 mg PO lasix as an outpatient. Home dose resumed. hold ARB. Strict I/Os.   diabetes mellitus type II-stable Continue sliding scale coverage. Hemoglobin A1c 6.9  physical deconditioning PT recommended home health            Code Status: full code Family Communication  :None at bedside Disposition Plan  : Final disposition per pulmonary recommendations     Consults  :  Cardiology PCCM Renal   Procedures  :  2-D echo  DVT Prophylaxis  :  IV heparin  Lab Results  Component Value Date   PLT 279 12/30/2015    Antibiotics  :   Rocephin 5/18- Azithromycin 5/18-5/19      Objective:   Filed Vitals:   12/29/15 2046 12/30/15 0425 12/30/15 0608 12/30/15 0727  BP: 117/57  121/45   Pulse: 58  65   Temp: 98.2 F (36.8 C)  97.5 F (36.4 C)   TempSrc: Oral  Oral   Resp: 18  18   Height:      Weight:  74.254 kg (163 lb 11.2 oz)    SpO2: 99%  100% 98%    Wt Readings from Last 3 Encounters:  12/30/15 74.254 kg (163 lb 11.2 oz)  08/17/14 70.126 kg (154 lb 9.6 oz)  03/20/14 75.501 kg (166 lb 7.2 oz)     Intake/Output Summary (Last 24 hours) at 12/30/15 1031 Last data filed at 12/30/15 0951  Gross per 24 hour  Intake    720 ml  Output    800 ml  Net    -80 ml     Physical Exam  Gen: not in distress, Appears fatigued  HEENT: no pallor, moist mucosa, supple neck chest: diminished right-sided breath sounds CVS: N S1&S2, no murmurs, rubs or gallop GI: soft, NT, ND, BS+ Musculoskeletal: warm, no edema CNS: AAOX3, non focal    Data Review:    CBC  Recent Labs Lab 12/24/15 0320 12/25/15 0534 12/27/15 0307 12/29/15 0341 12/30/15 0301  WBC 12.2* 8.3 7.6 8.1 8.3  HGB 11.2* 11.3* 10.7* 11.0* 10.8*  HCT 34.4* 35.8* 34.3* 36.0 35.2*  PLT 305 306 286 288 279  MCV 86.4 87.5 88.6 90.2 90.5  MCH 28.1 27.6 27.6 27.6 27.8  MCHC 32.6 31.6 31.2 30.6 30.7  RDW 13.8 14.0 14.6  15.2 15.3    Chemistries   Recent Labs Lab 12/25/15 0534 12/26/15 1424 12/27/15 0307 12/28/15 0505 12/29/15 0341 12/30/15 0301  NA 134* 134* 134* 136 137 136  K 4.4 3.8 4.2 4.6 5.4* 5.1  CL 101 102 101 103 103 104  CO2 23 22 22 22 26 23   GLUCOSE 110* 104* 123* 117* 131* 103*  BUN 61* 49* 49* 47* 44* 41*  CREATININE 1.72* 1.53* 1.64* 1.55* 1.77* 1.54*  CALCIUM 9.9 9.6 9.4 9.4 9.2 8.7*  AST 16 19 16 16   --  18  ALT 9* 11* 10* 10*  --  11*  ALKPHOS 86 87 79 83  --  80  BILITOT 0.4 0.3 0.5 0.6  --  0.5   ------------------------------------------------------------------------------------------------------------------ No results for input(s): CHOL, HDL, LDLCALC, TRIG, CHOLHDL, LDLDIRECT in the last 72 hours.  Lab Results  Component Value Date   HGBA1C 6.9* 12/18/2015   ------------------------------------------------------------------------------------------------------------------ No results for input(s): TSH, T4TOTAL, T3FREE, THYROIDAB in the last 72 hours.  Invalid input(s): FREET3 ------------------------------------------------------------------------------------------------------------------  Recent Labs  12/28/15 1207  FERRITIN 464*  TIBC 255  IRON 46    Coagulation profile  Recent Labs Lab 12/23/15 1607  INR 1.36    No results for input(s): DDIMER in the last 72 hours.  Cardiac Enzymes No results for input(s): CKMB, TROPONINI, MYOGLOBIN in the last 168 hours.  Invalid input(s): CK ------------------------------------------------------------------------------------------------------------------    Component Value Date/Time   BNP 1232.3* 12/18/2015 0830    Inpatient Medications  Scheduled Meds: . albuterol  2.5 mg Nebulization BID  . allopurinol  100 mg Oral QHS  . antiseptic oral rinse  7 mL Mouth Rinse BID  . apixaban  2.5 mg Oral BID  . aspirin EC  81 mg Oral Daily  . atorvastatin  10 mg Oral q1800  . cefTRIAXone (ROCEPHIN)  IV  2 g  Intravenous Q24H  . cinacalcet  30 mg Oral Q supper  . diltiazem  240 mg Oral Daily  . famotidine  20 mg Oral Daily  . furosemide  80 mg Oral Daily  . guaiFENesin  1,200 mg Oral BID  . insulin aspart  0-15 Units Subcutaneous TID WC  . metoprolol  25 mg Oral BID  . mycophenolate  500 mg Oral BID  . sodium chloride flush  3 mL Intravenous Q12H  . tacrolimus  1 mg Oral QHS  . tacrolimus  1.5 mg Oral q morning - 10a   Continuous Infusions: . sodium chloride 10 mL/hr (12/24/15 0732)   PRN Meds:.sodium chloride, acetaminophen, albuterol, nitroGLYCERIN, ondansetron (ZOFRAN) IV, senna-docusate, sodium chloride flush, traMADol  Micro Results Recent Results (from the past 240 hour(s))  Culture, blood (Routine X 2) w Reflex to ID Panel     Status: None   Collection Time: 12/22/15 10:58 AM  Result Value Ref Range Status   Specimen Description BLOOD RIGHT HAND  Final   Special Requests IN PEDIATRIC BOTTLE  3CC  Final   Culture NO GROWTH 5 DAYS  Final   Report Status 12/27/2015 FINAL  Final  Culture, blood (Routine X 2) w Reflex to ID Panel     Status: None   Collection Time: 12/22/15 11:00 AM  Result Value Ref Range Status   Specimen Description BLOOD RIGHT HAND  Final   Special Requests IN  PEDIATRIC BOTTLE 3.0CC  Final   Culture NO GROWTH 5 DAYS  Final   Report Status 12/27/2015 FINAL  Final  Gram stain     Status: None   Collection Time: 12/24/15  9:01 PM  Result Value Ref Range Status   Specimen Description PLEURAL  Final   Special Requests Immunocompromised  Final   Gram Stain   Final    CYTOSPIN SMEAR WBC PRESENT,BOTH PMN AND MONONUCLEAR NO ORGANISMS SEEN    Report Status 12/25/2015 FINAL  Final  Culture, body fluid-bottle     Status: None (Preliminary result)   Collection Time: 12/24/15  9:01 PM  Result Value Ref Range Status   Specimen Description PLEURAL  Final   Special Requests AEROBIC BOTTLE ONLY  Final   Culture NO GROWTH 4 DAYS  Final   Report Status PENDING   Incomplete  Culture, body fluid-bottle     Status: None (Preliminary result)   Collection Time: 12/28/15  2:30 PM  Result Value Ref Range Status   Specimen Description FLUID PLEURAL  Final   Special Requests NONE  Final   Culture NO GROWTH < 24 HOURS  Final   Report Status PENDING  Incomplete  Gram stain     Status: None   Collection Time: 12/28/15  2:30 PM  Result Value Ref Range Status   Specimen Description FLUID PLEURAL  Final   Special Requests NONE  Final   Gram Stain   Final    FEW WBC PRESENT,BOTH PMN AND MONONUCLEAR NO ORGANISMS SEEN    Report Status 12/28/2015 FINAL  Final    Radiology Reports Dg Chest 1 View  12/30/2015  CLINICAL DATA:  Follow-up pleural effusion, history of multiple thoracentesis ease, history of CHF, coronary artery disease and renal transplantation EXAM: CHEST 1 VIEW COMPARISON:  Chest x-ray of Dec 28, 2015 FINDINGS: There is a small right pleural effusion which appears larger than on the previous study. There is no mediastinal shift. There is no significant left pleural effusion. The cardiac silhouette remains enlarged. The central pulmonary vascularity remains prominent. The trachea is midline. There are degenerative changes of both shoulders. IMPRESSION: Small right pleural effusion which has increased in size since the study of 2 days ago. There is stable cardiomegaly without pulmonary edema. Electronically Signed   By: David  [place] [name]   On: 12/30/2015 08:23   Dg Chest 1 View  12/28/2015  CLINICAL DATA:  Status post right thoracentesis EXAM: CHEST 1 VIEW COMPARISON:  Chest radiograph from earlier today. FINDINGS: Stable cardiomediastinal silhouette with mild-to-moderate cardiomegaly. No pneumothorax. Small residual right pleural effusion, significantly decreased. No left pleural effusion. No pulmonary edema. Improved aeration at the right lung base with residual mild right basilar atelectasis. IMPRESSION: 1. Small residual right pleural effusion,  significantly decreased. No pneumothorax. 2. Improved right lung base aeration with residual mild right basilar atelectasis. 3. Stable mild-to-moderate cardiomegaly without pulmonary edema. Electronically Signed   By: Delbert Phenix [name]   On: 12/28/2015 14:50   Dg Chest 2 View  12/28/2015  CLINICAL DATA:  Shortness of breath EXAM: CHEST  2 VIEW COMPARISON:  12/26/2015 chest radiograph. FINDINGS: Surgical clips overlie the lower left axilla. Stable cardiomediastinal silhouette with mild-to-moderate cardiomegaly. No pneumothorax. Moderate right pleural effusion is increased. No left pleural effusion. No pulmonary edema. Worsening right basilar lung opacity, favor atelectasis. IMPRESSION: 1. Stable mild-to-moderate cardiomegaly without pulmonary edema. 2. Moderate right pleural effusion, increased. 3. Worsened right basilar lung opacity, favor atelectasis. Electronically Signed   By: Barbara Cower  A Poff [name]   On: 12/28/2015 13:07   Dg Chest 2 View  12/26/2015  CLINICAL DATA:  SOB WITH EXERTION, PLEURAL EFFUSION,HX DM,CAD,KIDNEY TRANSPLANT EXAM: CHEST  2 VIEW COMPARISON:  12/25/2015 FINDINGS: Heart is enlarged. There has been some improvement in aeration of right lung base. Small right pleural effusion persists. Left lung is clear. IMPRESSION: 1. Improved aeration at the right lung base. 2. Right pleural effusion. 3. Cardiomegaly. Electronically Signed   By: Norva Pavlov [name]   On: 12/26/2015 08:59   Ct Chest Wo Contrast  12/23/2015  CLINICAL DATA:  Shortness of breath. Pleural effusion. Congestive heart failure and diabetes. EXAM: CT CHEST WITHOUT CONTRAST TECHNIQUE: Multidetector CT imaging of the chest was performed following the standard protocol without IV contrast. COMPARISON:  Chest radiograph dated 12/23/2015 FINDINGS: Mediastinum/Lymph Nodes: No masses or pathologically enlarged lymph nodes identified on this un-enhanced exam. The heart is enlarged. There is a small pericardial effusion. Atherosclerotic  disease of the coronary arteries and thoracic aorta is noted. Layering material fills the esophagus dependently. Lungs/Pleura: There is a moderate to large water density right pleural effusion. There is right lower lobe airspace consolidation versus lobar atelectasis. There is patchy airspace consolidation in the basilar segments of the left lower lobe. Upper abdomen: No acute findings. Musculoskeletal: No chest wall mass or suspicious bone lesions identified. IMPRESSION: Large right pleural effusion with right lower lobe collapse. Patchy airspace disease within the dependent segments of the left lower lobe. Heterogeneous material filling the distal 2/3 of the esophagus. These findings raise the possibility of aspiration pneumonia. Endobronchial lesion causing collapse of the right lower lobe is also in the differential diagnosis, although one is not visualized on this nonenhanced exam. Alternatively the large right pleural effusion may be causing secondary compressive atelectasis of the right lower lobe. Enlarged cardiac silhouette. Atherosclerotic disease of the coronary arteries and aorta. Electronically Signed   By: Ted Mcalpine [name]   On: 12/23/2015 14:41   Nm Myocar Multi W/spect W/wall Motion / Ef  12/26/2015  CLINICAL DATA:  Coronary artery disease, CHF, hypertension, diabetes, chest pain, shortness of Breath EXAM: MYOCARDIAL IMAGING WITH SPECT (REST AND PHARMACOLOGIC-STRESS) GATED LEFT VENTRICULAR WALL MOTION STUDY LEFT VENTRICULAR EJECTION FRACTION TECHNIQUE: Standard myocardial SPECT imaging was performed after resting intravenous injection of 10 mCi Tc-58m sestamibi. Subsequently, intravenous infusion of Lexiscan was performed under the supervision of the Cardiology staff. At peak effect of the drug, 30 mCi Tc-84m sestamibi was injected intravenously and standard myocardial SPECT imaging was performed. Quantitative gated imaging was also performed to evaluate left ventricular wall motion, and  estimate left ventricular ejection fraction. COMPARISON:  None. FINDINGS: Perfusion: There is a small area of moderately decreased activity in the inferior wall of the left ventricular myocardium on both the resting and stress images. No suggestion of ischemia. Wall Motion: No left ventricular enlargement. There is decreased myocardial thickening in wall motion involving the septum and inferior wall of the left ventricle. Left Ventricular Ejection Fraction: 52 % End diastolic volume 94 ml End systolic volume 45 ml IMPRESSION: 1. Probable inferior wall infarct.  No evidence of ischemia. 2. Mild inferior wall and septal hypokinesis. 3. Left ventricular ejection fraction 52% 4. Low-risk stress test findings*. *2012 Appropriate Use Criteria for Coronary Revascularization Focused Update: J Am Coll Cardiol. 2012;59(9):857-881. http://content.dementiazones.com.aspx?articleid=[email or web address] Electronically Signed   By: Corlis Leak [name]   On: 12/26/2015 16:52   Dg Chest Port 1 View  12/25/2015  CLINICAL DATA:  Pleural effusion.  Thoracentesis. EXAM:  PORTABLE CHEST 1 VIEW COMPARISON:  12/24/2015.  CT 12/23/2015. FINDINGS: Mediastinum and hilar structures are stable. Stable cardiomegaly. Continued re-expansion of right base atelectasis. Right base infiltrate present. Small right pleural effusion. No pneumothorax. IMPRESSION: 1. Continued re-expansion of right base atelectasis. Right base infiltrates present. Small right pleural effusion. 2. Stable cardiomegaly.  No pulmonary venous congestion . Electronically Signed   By: Maisie Fus  Register   On: 12/25/2015 07:42   Dg Chest Port 1 View  12/24/2015  CLINICAL DATA:  Status post right thoracentesis EXAM: PORTABLE CHEST 1 VIEW COMPARISON:  Earlier today FINDINGS: Decreased right pleural effusion, trace to small currently. No pneumothorax or re-expansion edema. Patchy opacity in the right base along its is presumably incomplete resolution of atelectasis given findings of right  middle and lower lobe collapse on chest CT 12/23/2015. The left lung is clear. Stable aortic contours. IMPRESSION: 1. No acute finding after right thoracentesis. There is minimal residual fluid. 2. Partial re-expansion of the collapsed right middle and lower lobe seen on CT yesterday. Electronically Signed   By: Marnee Spring [name]   On: 12/24/2015 13:59   Dg Chest Port 1 View  12/24/2015  CLINICAL DATA:  Shortness of breath.  Pleural effusion . EXAM: PORTABLE CHEST 1 VIEW COMPARISON:  CT 12/23/2015.  Chest x-ray 12/23/2015. FINDINGS: Mediastinum hilar structures are stable. Stable cardiomegaly. Stable right lower lobe atelectasis and/or consolidation and right pleural effusion. Left lung is clear. No pneumothorax. IMPRESSION: 1. Stable right lower lobe atelectasis and/or consolidation and right pleural effusion. 2. Stable cardiomegaly. Electronically Signed   By: Maisie Fus  Register   On: 12/24/2015 07:11   Dg Chest Port 1 View  12/23/2015  CLINICAL DATA:  Pneumonia. EXAM: PORTABLE CHEST 1 VIEW COMPARISON:  12/22/2015. FINDINGS: Stable cardiomegaly. Progressive infiltrate right lower lobe. Progressive right pleural effusion. Mild left base subsegmental atelectasis and or infiltrate. Left hemidiaphragm not imaged. No pneumothorax. IMPRESSION: 1. Progressive right lower lobe infiltrate and right pleural effusion. 2.  Left base subsegmental atelectasis and or mild infiltrate. 3.  Cardiomegaly.  No pulmonary venous congestion . Electronically Signed   By: Maisie Fus  Register   On: 12/23/2015 07:18   Dg Chest Port 1 View  12/22/2015  CLINICAL DATA:  Continued assessment of pleural effusion. EXAM: PORTABLE CHEST 1 VIEW COMPARISON:  12/21/2015. FINDINGS: Cardiomegaly. Large RIGHT pleural effusion is increased. Compressive atelectasis at the RIGHT base. Trace LEFT effusion may be improved. Overall improved vascular congestion. IMPRESSION: Increasing RIGHT pleural effusion otherwise improved exam. Electronically Signed    By: Elsie Stain [name]   On: 12/22/2015 07:27   Dg Chest Port 1 View  12/21/2015  CLINICAL DATA:  Community-acquired pneumonia EXAM: PORTABLE CHEST 1 VIEW COMPARISON:  Chest radiograph from one day prior. FINDINGS: Stable cardiomediastinal silhouette with mild cardiomegaly. No pneumothorax. Moderate right and small left pleural effusions, increased bilaterally. Mild pulmonary edema, increased. Patchy bibasilar lung consolidation, right greater than left, slightly worsened bilaterally. IMPRESSION: 1. Patchy bibasilar lung consolidation, right greater than left, slightly worsened bilaterally, likely a combination of atelectasis and pneumonia. 2. Stable mild cardiomegaly with increased mild pulmonary edema. 3. Moderate right and small left pleural effusions, increased bilaterally. Electronically Signed   By: Delbert Phenix [name]   On: 12/21/2015 08:39   Dg Chest Port 1 View  12/20/2015  CLINICAL DATA:  Shortness of breath EXAM: PORTABLE CHEST 1 VIEW COMPARISON:  12/18/2015 FINDINGS: New right lower lung opacity, likely combination of atelectasis/ pneumonia and layering pleural effusion. Mild left basilar opacity, possibly  atelectasis. Cardiomegaly.  No frank interstitial edema. No pneumothorax. IMPRESSION: New right lower lung opacity, likely combination of atelectasis/ pneumonia and layering pleural effusion. Mild left basilar opacity, possibly atelectasis. Electronically Signed   By: Charline Bills [name]   On: 12/20/2015 11:53   Dg Chest Port 1 View  12/18/2015  CLINICAL DATA:  Productive cough for 2 weeks, shortness of breath, headache EXAM: PORTABLE CHEST 1 VIEW COMPARISON:  08/11/2014 FINDINGS: Borderline cardiomegaly. No pulmonary edema. There is streaky left base retrocardiac atelectasis or early infiltrate. Atherosclerotic but for sclerotic calcifications of thoracic aorta. IMPRESSION: Streaky left base retrocardiac atelectasis or early infiltrate. No pulmonary edema. Follow-up to resolution  recommended. Electronically Signed   By: Natasha Mead [name]   On: 12/18/2015 09:38   US Thoracentesis Asp Pleural Space W/img Guide  12/28/2015  CLINICAL DATA:  Recurrent pleural effusion, shortness of Breath EXAM: EXAM THORACENTESIS WITH ULTRASOUND TECHNIQUE: The procedure, risks (including but not limited to bleeding, infection, organ damage ), benefits, and alternatives were explained to the patient. Questions regarding the procedure were encouraged and answered. The patient understands and consents to the procedure. Survey ultrasound of the right hemithorax was performed and an appropriate skin entry site was localized. Site was marked, prepped with Betadine, draped in usual sterile fashion, infiltrated locally with 1% lidocaine. The Safe-T-Centesis needle was advanced into the pleural space. Clear thin fluid returned. 880 mL was removed. Samples sent for requested laboratory studies. The patient tolerated procedure well. COMPLICATIONS: COMPLICATIONS None immediate IMPRESSION: Technically successful ultrasound-guided right thoracentesis. Follow-up chest radiograph pending. Electronically Signed   By: Corlis Leak [name]   On: 12/28/2015 17:00    Time Spent in minutes  35.   Richarda Overlie M.D on 12/30/2015 at 10:31 AM  Between 7am to 7pm - Pager - [phone]  After 7pm go to [email or web address] - password [facility]  [facility] -  Office  [phone]

## 2015-12-31 ENCOUNTER — Inpatient Hospital Stay (HOSPITAL_COMMUNITY): Payer: Medicare Other

## 2015-12-31 DIAGNOSIS — I5032 Chronic diastolic (congestive) heart failure: Secondary | ICD-10-CM | POA: Insufficient documentation

## 2015-12-31 LAB — GLUCOSE, CAPILLARY
GLUCOSE-CAPILLARY: 120 mg/dL — AB (ref 65–99)
GLUCOSE-CAPILLARY: 125 mg/dL — AB (ref 65–99)
GLUCOSE-CAPILLARY: 94 mg/dL (ref 65–99)
GLUCOSE-CAPILLARY: 108 mg/dL — AB (ref 65–99)

## 2015-12-31 LAB — BASIC METABOLIC PANEL
Anion gap: 7 (ref 5–15)
BUN: 37 mg/dL — AB (ref 6–20)
CHLORIDE: 105 mmol/L (ref 101–111)
CO2: 27 mmol/L (ref 22–32)
CREATININE: 1.54 mg/dL — AB (ref 0.44–1.00)
Calcium: 9.1 mg/dL (ref 8.9–10.3)
GFR calc Af Amer: 35 mL/min — ABNORMAL LOW (ref >60)
GFR calc non Af Amer: 30 mL/min — ABNORMAL LOW (ref >60)
Glucose, Bld: 121 mg/dL — ABNORMAL HIGH (ref 65–99)
Potassium: 5.6 mmol/L — ABNORMAL HIGH (ref 3.5–5.1)
SODIUM: 139 mmol/L (ref 135–145)

## 2015-12-31 LAB — TACROLIMUS LEVEL
Tacrolimus (FK506) - LabCorp: 8.3 ng/mL (ref 2.0–20.0)
Tacrolimus (FK506) - LabCorp: 9.1 ng/mL (ref 2.0–20.0)
Tacrolimus (FK506) - LabCorp: 10.3 ng/mL (ref 2.0–20.0)

## 2015-12-31 MED ORDER — FUROSEMIDE 40 MG PO TABS
60.0000 mg | ORAL_TABLET | Freq: Two times a day (BID) | ORAL | Status: DC
  Administered 2015-12-31 – 2016-01-01 (×2): 60 mg via ORAL
  Filled 2015-12-31 (×2): qty 1

## 2015-12-31 MED ORDER — FUROSEMIDE 10 MG/ML IJ SOLN
60.0000 mg | Freq: Once | INTRAMUSCULAR | Status: AC
  Administered 2015-12-31: 60 mg via INTRAVENOUS
  Filled 2015-12-31: qty 6

## 2015-12-31 NOTE — Progress Notes (Signed)
PULMONARY / CRITICAL CARE MEDICINE   Name: Unknown FoleyMae W Patel MRN: [id number] DOB: 1931-10-22    ADMISSION DATE:  12/18/2015 CONSULTATION DATE:  12/20/2015  REFERRING MD:  Dr. Anne FuSkains  CHIEF COMPLAINT:  Short of breath  SUBJECTIVE: no acute events overnight, no complaints, breathing better  VITAL SIGNS: BP 128/52 mmHg  Pulse 61  Temp(Src) 97.5 F (36.4 C) (Oral)  Resp 18  Ht 5\' 3"  (1.6 m)  Wt 164 lb 6.4 oz (74.571 kg)  BMI 29.13 kg/m2  SpO2 97%  INTAKE / OUTPUT: I/O last 3 completed shifts: In: 840 [P.O.:840] Out: 1450 [Urine:1450]  PHYSICAL EXAMINATION:  General:  Elderly female in NAD, alert, remains on O2 Neuro:  Normal strength, AAOx4 HEENT:  no stridor, NCAT, PERRL Cardiovascular: s1 s2 RR irregular  Lungs:  Coarse at both bases, diminished 1/3 up R posterior lung field  Abdomen:  Soft, non tender Musculoskeletal:  No edema Skin:  No rashes or lesions  LABS:  BMET  Recent Labs Lab 12/29/15 0341 12/30/15 0301 12/31/15 0637  NA 137 136 139  K 5.4* 5.1 5.6*  CL 103 104 105  CO2 26 23 27   BUN 44* 41* 37*  CREATININE 1.77* 1.54* 1.54*  GLUCOSE 131* 103* 121*    Electrolytes  Recent Labs Lab 12/29/15 0341 12/30/15 0301 12/31/15 0637  CALCIUM 9.2 8.7* 9.1  PHOS 3.7  --   --     CBC  Recent Labs Lab 12/27/15 0307 12/29/15 0341 12/30/15 0301  WBC 7.6 8.1 8.3  HGB 10.7* 11.0* 10.8*  HCT 34.3* 36.0 35.2*  PLT 286 288 279    Coag's No results for input(s): APTT, INR in the last 168 hours.  Sepsis Markers No results for input(s): LATICACIDVEN, PROCALCITON, O2SATVEN in the last 168 hours.  ABG No results for input(s): PHART, PCO2ART, PO2ART in the last 168 hours.  Liver Enzymes  Recent Labs Lab 12/27/15 0307 12/28/15 0505 12/29/15 0341 12/30/15 0301  AST 16 16  --  18  ALT 10* 10*  --  11*  ALKPHOS 79 83  --  80  BILITOT 0.5 0.6  --  0.5  ALBUMIN 2.6* 2.7* 2.7* 2.7*    Cardiac Enzymes No results for input(s): TROPONINI, PROBNP  in the last 168 hours.  Glucose  Recent Labs Lab 12/29/15 2050 12/30/15 0547 12/30/15 1117 12/30/15 1635 12/30/15 2129 12/31/15 0630  GLUCAP 113* 108* 138* 93 123* 120*    Imaging No results found.   STUDIES:  5/19 Echo >> EF 55 to 60%, severe LVH  CULTURES: 5/18 Blood >> Pneumococcus 5/22 Blood >> strep pneumoniae >> sens rocephin  5/24 Pleural Fluid   Protein ratio >> 0.5  LDH ratio >> 0.8   Culture >>   Cytology >>neg  ANTIBIOTICS: 5/18 Zithromax >> 5/18 5/18 Rocephin >>  SIGNIFICANT EVENTS: 5/18 Admit 5/21 Bedside US >> not enough fluid to tap 5/24 Thoracentesis >> 1.4L removed 5/28 Thoracentesis with 800mL removed 5/30 CXR with recurrence of effusion 5/31 reports being better  DISCUSSION: 82 y/o female with pneumococcal PNA with strep pneumoniae bacteremia and acute hypoxic respiratory failure.  She also has A fib with RVR, NSTEMI from demand ischemia.   She has hx of Stage III CKD s/p renal transplant, DM, chronic diastolic CHF, HLD  Intake/Output Summary (Last 24 hours) at 12/31/15 0913 Last data filed at 12/31/15 0853  Gross per 24 hour  Intake    600 ml  Output   1000 ml  Net   -400 ml  ASSESSMENT / PLAN:  Large R pleural effusion - s/p thora on 5/24 with 1460 removed, clear yellow.  Exudate by LDH.  Suspect parapneumonic. Repeat tap 5/28 with 800cc removed. CXR on 5/30 with re-accumulation.  -Continued neg balance per renal -Will may repeat thoracentesis vs CVTS consult and chest tube drainage, continue with current treatment. -repeat cxr 5/31, effusion may decrease with negative i/o -consider ct chest.  Acute hypoxic respiratory failure 2nd to CAP. Pneumococcal PNA with bacteremia. - Supplemental O2 as needed to maintatin SpO2 > 92%, improving, await  c x r results  5/31 - ABX per primary     Brett Canales Minor ACNP [facility] Pager [phone] till 3 pm If no answer page [phone] 12/31/2015, 9:08 AM        ATTENDING NOTE /  ATTESTATION NOTE :   I have discussed the case with the resident/APP Steve Minor.  I agree with the resident/APP's  history, physical examination, assessment, and plans.  I have edited the above note and modified it according to our agreed history, physical examination, assessment and plan.   Discussed case with Dr. Susie Cassette. Pt with CXR today > RLL atelectasis with ? Effusion. CXR on 5/30 > small effusion.  Pt already had 2 thoracentesis on the R and has some pleural effusion again. Pt clinically feels better. VSS. (-) fever. Crackles bibasilar. Gr 1 edema. Pleural effusion likely 2/2 volume overload but need to R/O if its a complicated effusion/loculated effusion. Plan for chest ct scan.  If small effusion, will not do thoracentesis. Cont diuresis. Wean off O2. Cont abx.   Family : No family at bedside.    Pollie Meyer, MD 12/31/2015, 11:51 AM [facility] Pager [phone] After 3 pm or if no answer, call [phone]

## 2015-12-31 NOTE — Progress Notes (Signed)
PROGRESS NOTE                                                                                                                                                                                                             Patient Demographics:    [name], is a 82 y.o. female, DOB - 07-02-1932, ZOX:[id number]  Admit date - 12/18/2015   Admitting Physician No admitting provider for patient encounter.  Outpatient Primary MD for the patient is Blanch Media, MD  LOS - 13  Outpatient Specialists: Nephrology cardiology  Chief Complaint  Patient presents with  . Shortness of Breath  . Chest Pain       Brief Narrative   82 y.o. female with a history of CAD, HTN, DM2, CKD s/p renal transplant in 2003, admitted on 5/18 after presenting with shortness of breath and chest pain, found to have A fib with RVR (CHADVASC 7), as well as NSTEMI from demand ischemia. Also had a pneumococcal pneumonia  Hospital course complicated due to development of acute hypoxic respiratory failure with pneumococcal pneumonia. [facility] PCCM involving care. Nephrology consult and given worsened renal function. Patient transferred to hospitalist service for further care.Patient status post bedside thoracentesis on 5/24. Schedule for Lexiscan Myoview on 5/26   Subjective Continues to have shortness of breath with exertion  Hospital course    Acute respiratory failure with hypoxia secondary to pneumococcal pneumonia/Strep pneumo bacteremia and pleural effusion s/p thoracentesis bedside on 5/24 with 1460 removed, clear yellow. Likely exudative by LDH, repeat chest x-ray shows reexpansion of the right base, follow pleural fluid culture from 5/24, no growth from blood culture from 5/22. Last positive blood culture was on 5/18  given   IV Rocephin (10 day course)  starting 5/18  For  strep pneumococcal  bacteremia.  completed on 5/28. However patient had  reaccumulation of a parapneumonic effusion and had another 852mL's of  pleural  fluid removed  On  5/28. Therefore antibiotics are being resumed.PCCM  Has been reconsulted  For further evaluation. Repeat chest x-ray that shows slight increase in the right-sided pleural effusion.  CT scan on 5/23 showed  showed a large right pleural effusion with right lower lobe collapse and patchy airspace disease of the left lower lobe, finding suspicion for aspiration pneumonia, endobronchial lesion causing collapse of the right lower lobe is also in  the differential diagnosis  Patient still quite hypoxic requiring 2-3 L of oxygen with ambulation Repeat chest x-ray on 5/31 for comparison, increase diuretics, if increasing in size then patient may need cardiothoracic surgery -repeat thoracentesis vs CVTS consult and chest tube drainage   Acute on Chronic kidney disease stage III-baseline around 1.4 Continue Prograf. Dosage being adjusted by nephrology. Prograf level on 5/25 was 8. Repeat Prograf level still pending. Creatinine 2.32>1.54, suspected to hemodynamically mediated with atrial fibrillation/rapid ventricular response vs ATN, approaching baseline Status post renal transplant in 2003 maintained on Tac/MMF  Continue to monitor Prograf levels, currently on Lasix 80 mg a day, will increase to 60 mg by mouth twice a day Monitor Prograf levels, goal 4-6   A. fib with RVR,CHA2DS2VASc = 7 Required Cardizem drip on admission. Now stable on oral Cardizem and metoprolol. 2-D echo showed normal EF. now in sinus rhythm. Treated with IV heparin and subsequently switched to Eliquis   and follow-up with Dr. Rennis Golden in one week. Dose of metoprolol reduced to 25 twice a day for due to bradycardia    NSTE MI,s/p RCA BMS in 2002 - residual LAD and Diag dzs Likely secondary to demand ischemia with rapid A. fib/pneumonia. given renal transplant h/o and risk of nephrotoxicity of contrast dye, decided to do Myoview  instead Lexiscan Myoview 5/26 -showed inferior wall infarct with no evidence of reversible ischemia and EF of 52%   Acute on Chronic Diastolic CHF: BNP was abnormal at 1200 on admit. . Renal function fluctuating. She was on 80 mg PO lasix as an outpatient. Home dose resumed. hold ARB. Strict I/Os.   diabetes mellitus type II-stable Continue sliding scale coverage. Hemoglobin A1c 6.9  physical deconditioning PT recommended home health            Code Status: full code Family Communication  :None at bedside Disposition Plan  : Final disposition per pulmonary recommendations     Consults  :  Cardiology PCCM Renal   Procedures  :  2-D echo  DVT Prophylaxis  :  IV heparin  Lab Results  Component Value Date   PLT 279 12/30/2015    Antibiotics  :   Rocephin 5/18- Azithromycin 5/18-5/19      Objective:   Filed Vitals:   12/30/15 2053 12/30/15 2135 12/31/15 0623 12/31/15 0815  BP:  137/61 128/52   Pulse:  61 63 61  Temp:  97.7 F (36.5 C) 97.5 F (36.4 C)   TempSrc:  Oral Oral   Resp:  18 18 18   Height:      Weight:   74.571 kg (164 lb 6.4 oz)   SpO2: 98% 99% 96% 97%    Wt Readings from Last 3 Encounters:  12/31/15 74.571 kg (164 lb 6.4 oz)  08/17/14 70.126 kg (154 lb 9.6 oz)  03/20/14 75.501 kg (166 lb 7.2 oz)     Intake/Output Summary (Last 24 hours) at 12/31/15 0841 Last data filed at 12/31/15 0733  Gross per 24 hour  Intake    840 ml  Output   1000 ml  Net   -160 ml     Physical Exam  Gen: not in distress, Appears fatigued  HEENT: no pallor, moist mucosa, supple neck chest: diminished right-sided breath sounds CVS: N S1&S2, no murmurs, rubs or gallop GI: soft, NT, ND, BS+ Musculoskeletal: warm, no edema CNS: AAOX3, non focal    Data Review:    CBC  Recent Labs Lab 12/25/15 0534 12/27/15 0307 12/29/15 0341 12/30/15  0301  WBC 8.3 7.6 8.1 8.3  HGB 11.3* 10.7* 11.0* 10.8*  HCT 35.8* 34.3* 36.0 35.2*  PLT 306 286 288  279  MCV 87.5 88.6 90.2 90.5  MCH 27.6 27.6 27.6 27.8  MCHC 31.6 31.2 30.6 30.7  RDW 14.0 14.6 15.2 15.3    Chemistries   Recent Labs Lab 12/25/15 0534 12/26/15 1424 12/27/15 0307 12/28/15 0505 12/29/15 0341 12/30/15 0301 12/31/15 0637  NA 134* 134* 134* 136 137 136 139  K 4.4 3.8 4.2 4.6 5.4* 5.1 5.6*  CL 101 102 101 103 103 104 105  CO2 23 22 22 22 26 23 27   GLUCOSE 110* 104* 123* 117* 131* 103* 121*  BUN 61* 49* 49* 47* 44* 41* 37*  CREATININE 1.72* 1.53* 1.64* 1.55* 1.77* 1.54* 1.54*  CALCIUM 9.9 9.6 9.4 9.4 9.2 8.7* 9.1  AST 16 19 16 16   --  18  --   ALT 9* 11* 10* 10*  --  11*  --   ALKPHOS 86 87 79 83  --  80  --   BILITOT 0.4 0.3 0.5 0.6  --  0.5  --    ------------------------------------------------------------------------------------------------------------------ No results for input(s): CHOL, HDL, LDLCALC, TRIG, CHOLHDL, LDLDIRECT in the last 72 hours.  Lab Results  Component Value Date   HGBA1C 6.9* 12/18/2015   ------------------------------------------------------------------------------------------------------------------ No results for input(s): TSH, T4TOTAL, T3FREE, THYROIDAB in the last 72 hours.  Invalid input(s): FREET3 ------------------------------------------------------------------------------------------------------------------  Recent Labs  12/28/15 1207  FERRITIN 464*  TIBC 255  IRON 46    Coagulation profile No results for input(s): INR, PROTIME in the last 168 hours.  No results for input(s): DDIMER in the last 72 hours.  Cardiac Enzymes No results for input(s): CKMB, TROPONINI, MYOGLOBIN in the last 168 hours.  Invalid input(s): CK ------------------------------------------------------------------------------------------------------------------    Component Value Date/Time   BNP 1232.3* 12/18/2015 0830    Inpatient Medications  Scheduled Meds: . albuterol  2.5 mg Nebulization BID  . allopurinol  100 mg Oral QHS  .  antiseptic oral rinse  7 mL Mouth Rinse BID  . apixaban  2.5 mg Oral BID  . aspirin EC  81 mg Oral Daily  . atorvastatin  10 mg Oral q1800  . cefTRIAXone (ROCEPHIN)  IV  2 g Intravenous Q24H  . cinacalcet  30 mg Oral Q supper  . diltiazem  240 mg Oral Daily  . famotidine  20 mg Oral Daily  . furosemide  80 mg Oral Daily  . guaiFENesin  1,200 mg Oral BID  . insulin aspart  0-15 Units Subcutaneous TID WC  . metoprolol  25 mg Oral BID  . mycophenolate  500 mg Oral BID  . sodium chloride flush  3 mL Intravenous Q12H  . tacrolimus  1 mg Oral QHS  . tacrolimus  1.5 mg Oral q morning - 10a   Continuous Infusions: . sodium chloride 10 mL/hr (12/24/15 0732)   PRN Meds:.sodium chloride, acetaminophen, albuterol, nitroGLYCERIN, ondansetron (ZOFRAN) IV, senna-docusate, sodium chloride flush, traMADol  Micro Results Recent Results (from the past 240 hour(s))  Culture, blood (Routine X 2) w Reflex to ID Panel     Status: None   Collection Time: 12/22/15 10:58 AM  Result Value Ref Range Status   Specimen Description BLOOD RIGHT HAND  Final   Special Requests IN PEDIATRIC BOTTLE  3CC  Final   Culture NO GROWTH 5 DAYS  Final   Report Status 12/27/2015 FINAL  Final  Culture, blood (Routine X 2) w  Reflex to ID Panel     Status: None   Collection Time: 12/22/15 11:00 AM  Result Value Ref Range Status   Specimen Description BLOOD RIGHT HAND  Final   Special Requests IN PEDIATRIC BOTTLE 3.0CC  Final   Culture NO GROWTH 5 DAYS  Final   Report Status 12/27/2015 FINAL  Final  Gram stain     Status: None   Collection Time: 12/24/15  9:01 PM  Result Value Ref Range Status   Specimen Description PLEURAL  Final   Special Requests Immunocompromised  Final   Gram Stain   Final    CYTOSPIN SMEAR WBC PRESENT,BOTH PMN AND MONONUCLEAR NO ORGANISMS SEEN    Report Status 12/25/2015 FINAL  Final  Culture, body fluid-bottle     Status: None   Collection Time: 12/24/15  9:01 PM  Result Value Ref Range  Status   Specimen Description PLEURAL  Final   Special Requests AEROBIC BOTTLE ONLY  Final   Culture NO GROWTH 5 DAYS  Final   Report Status 12/30/2015 FINAL  Final  Culture, body fluid-bottle     Status: None (Preliminary result)   Collection Time: 12/28/15  2:30 PM  Result Value Ref Range Status   Specimen Description FLUID PLEURAL  Final   Special Requests NONE  Final   Culture NO GROWTH 2 DAYS  Final   Report Status PENDING  Incomplete  Gram stain     Status: None   Collection Time: 12/28/15  2:30 PM  Result Value Ref Range Status   Specimen Description FLUID PLEURAL  Final   Special Requests NONE  Final   Gram Stain   Final    FEW WBC PRESENT,BOTH PMN AND MONONUCLEAR NO ORGANISMS SEEN    Report Status 12/28/2015 FINAL  Final    Radiology Reports Dg Chest 1 View  12/30/2015  CLINICAL DATA:  Follow-up pleural effusion, history of multiple thoracentesis ease, history of CHF, coronary artery disease and renal transplantation EXAM: CHEST 1 VIEW COMPARISON:  Chest x-ray of Dec 28, 2015 FINDINGS: There is a small right pleural effusion which appears larger than on the previous study. There is no mediastinal shift. There is no significant left pleural effusion. The cardiac silhouette remains enlarged. The central pulmonary vascularity remains prominent. The trachea is midline. There are degenerative changes of both shoulders. IMPRESSION: Small right pleural effusion which has increased in size since the study of 2 days ago. There is stable cardiomegaly without pulmonary edema. Electronically Signed   By: David  [place] [name]   On: 12/30/2015 08:23   Dg Chest 1 View  12/28/2015  CLINICAL DATA:  Status post right thoracentesis EXAM: CHEST 1 VIEW COMPARISON:  Chest radiograph from earlier today. FINDINGS: Stable cardiomediastinal silhouette with mild-to-moderate cardiomegaly. No pneumothorax. Small residual right pleural effusion, significantly decreased. No left pleural effusion. No pulmonary  edema. Improved aeration at the right lung base with residual mild right basilar atelectasis. IMPRESSION: 1. Small residual right pleural effusion, significantly decreased. No pneumothorax. 2. Improved right lung base aeration with residual mild right basilar atelectasis. 3. Stable mild-to-moderate cardiomegaly without pulmonary edema. Electronically Signed   By: Delbert Phenix [name]   On: 12/28/2015 14:50   Dg Chest 2 View  12/28/2015  CLINICAL DATA:  Shortness of breath EXAM: CHEST  2 VIEW COMPARISON:  12/26/2015 chest radiograph. FINDINGS: Surgical clips overlie the lower left axilla. Stable cardiomediastinal silhouette with mild-to-moderate cardiomegaly. No pneumothorax. Moderate right pleural effusion is increased. No left pleural effusion. No pulmonary edema.  Worsening right basilar lung opacity, favor atelectasis. IMPRESSION: 1. Stable mild-to-moderate cardiomegaly without pulmonary edema. 2. Moderate right pleural effusion, increased. 3. Worsened right basilar lung opacity, favor atelectasis. Electronically Signed   By: Delbert PhenixJason A Poff [name]   On: 12/28/2015 13:07   Dg Chest 2 View  12/26/2015  CLINICAL DATA:  SOB WITH EXERTION, PLEURAL EFFUSION,HX DM,CAD,KIDNEY TRANSPLANT EXAM: CHEST  2 VIEW COMPARISON:  12/25/2015 FINDINGS: Heart is enlarged. There has been some improvement in aeration of right lung base. Small right pleural effusion persists. Left lung is clear. IMPRESSION: 1. Improved aeration at the right lung base. 2. Right pleural effusion. 3. Cardiomegaly. Electronically Signed   By: Norva PavlovElizabeth  Brown [name]   On: 12/26/2015 08:59   Ct Chest Wo Contrast  12/23/2015  CLINICAL DATA:  Shortness of breath. Pleural effusion. Congestive heart failure and diabetes. EXAM: CT CHEST WITHOUT CONTRAST TECHNIQUE: Multidetector CT imaging of the chest was performed following the standard protocol without IV contrast. COMPARISON:  Chest radiograph dated 12/23/2015 FINDINGS: Mediastinum/Lymph Nodes: No masses or  pathologically enlarged lymph nodes identified on this un-enhanced exam. The heart is enlarged. There is a small pericardial effusion. Atherosclerotic disease of the coronary arteries and thoracic aorta is noted. Layering material fills the esophagus dependently. Lungs/Pleura: There is a moderate to large water density right pleural effusion. There is right lower lobe airspace consolidation versus lobar atelectasis. There is patchy airspace consolidation in the basilar segments of the left lower lobe. Upper abdomen: No acute findings. Musculoskeletal: No chest wall mass or suspicious bone lesions identified. IMPRESSION: Large right pleural effusion with right lower lobe collapse. Patchy airspace disease within the dependent segments of the left lower lobe. Heterogeneous material filling the distal 2/3 of the esophagus. These findings raise the possibility of aspiration pneumonia. Endobronchial lesion causing collapse of the right lower lobe is also in the differential diagnosis, although one is not visualized on this nonenhanced exam. Alternatively the large right pleural effusion may be causing secondary compressive atelectasis of the right lower lobe. Enlarged cardiac silhouette. Atherosclerotic disease of the coronary arteries and aorta. Electronically Signed   By: Ted Mcalpineobrinka  Dimitrova [name]   On: 12/23/2015 14:41   Nm Myocar Multi W/spect W/wall Motion / Ef  12/26/2015  CLINICAL DATA:  Coronary artery disease, CHF, hypertension, diabetes, chest pain, shortness of Breath EXAM: MYOCARDIAL IMAGING WITH SPECT (REST AND PHARMACOLOGIC-STRESS) GATED LEFT VENTRICULAR WALL MOTION STUDY LEFT VENTRICULAR EJECTION FRACTION TECHNIQUE: Standard myocardial SPECT imaging was performed after resting intravenous injection of 10 mCi [id number] sestamibi. Subsequently, intravenous infusion of Lexiscan was performed under the supervision of the Cardiology staff. At peak effect of the drug, 30 mCi Tc-5663m sestamibi was injected  intravenously and standard myocardial SPECT imaging was performed. Quantitative gated imaging was also performed to evaluate left ventricular wall motion, and estimate left ventricular ejection fraction. COMPARISON:  None. FINDINGS: Perfusion: There is a small area of moderately decreased activity in the inferior wall of the left ventricular myocardium on both the resting and stress images. No suggestion of ischemia. Wall Motion: No left ventricular enlargement. There is decreased myocardial thickening in wall motion involving the septum and inferior wall of the left ventricle. Left Ventricular Ejection Fraction: 52 % End diastolic volume 94 ml End systolic volume 45 ml IMPRESSION: 1. Probable inferior wall infarct.  No evidence of ischemia. 2. Mild inferior wall and septal hypokinesis. 3. Left ventricular ejection fraction 52% 4. Low-risk stress test findings*. *2012 Appropriate Use Criteria for Coronary Revascularization Focused Update: J Am Coll  Cardiol. 2012;59(9):857-881. http://content.dementiazones.com.aspx?articleid=[email or web address] Electronically Signed   By: Corlis Leak [name]   On: 12/26/2015 16:52   Dg Chest Port 1 View  12/25/2015  CLINICAL DATA:  Pleural effusion.  Thoracentesis. EXAM: PORTABLE CHEST 1 VIEW COMPARISON:  12/24/2015.  CT 12/23/2015. FINDINGS: Mediastinum and hilar structures are stable. Stable cardiomegaly. Continued re-expansion of right base atelectasis. Right base infiltrate present. Small right pleural effusion. No pneumothorax. IMPRESSION: 1. Continued re-expansion of right base atelectasis. Right base infiltrates present. Small right pleural effusion. 2. Stable cardiomegaly.  No pulmonary venous congestion . Electronically Signed   By: Maisie Fus  Register   On: 12/25/2015 07:42   Dg Chest Port 1 View  12/24/2015  CLINICAL DATA:  Status post right thoracentesis EXAM: PORTABLE CHEST 1 VIEW COMPARISON:  Earlier today FINDINGS: Decreased right pleural effusion, trace to small currently. No  pneumothorax or re-expansion edema. Patchy opacity in the right base along its is presumably incomplete resolution of atelectasis given findings of right middle and lower lobe collapse on chest CT 12/23/2015. The left lung is clear. Stable aortic contours. IMPRESSION: 1. No acute finding after right thoracentesis. There is minimal residual fluid. 2. Partial re-expansion of the collapsed right middle and lower lobe seen on CT yesterday. Electronically Signed   By: Marnee Spring [name]   On: 12/24/2015 13:59   Dg Chest Port 1 View  12/24/2015  CLINICAL DATA:  Shortness of breath.  Pleural effusion . EXAM: PORTABLE CHEST 1 VIEW COMPARISON:  CT 12/23/2015.  Chest x-ray 12/23/2015. FINDINGS: Mediastinum hilar structures are stable. Stable cardiomegaly. Stable right lower lobe atelectasis and/or consolidation and right pleural effusion. Left lung is clear. No pneumothorax. IMPRESSION: 1. Stable right lower lobe atelectasis and/or consolidation and right pleural effusion. 2. Stable cardiomegaly. Electronically Signed   By: Maisie Fus  Register   On: 12/24/2015 07:11   Dg Chest Port 1 View  12/23/2015  CLINICAL DATA:  Pneumonia. EXAM: PORTABLE CHEST 1 VIEW COMPARISON:  12/22/2015. FINDINGS: Stable cardiomegaly. Progressive infiltrate right lower lobe. Progressive right pleural effusion. Mild left base subsegmental atelectasis and or infiltrate. Left hemidiaphragm not imaged. No pneumothorax. IMPRESSION: 1. Progressive right lower lobe infiltrate and right pleural effusion. 2.  Left base subsegmental atelectasis and or mild infiltrate. 3.  Cardiomegaly.  No pulmonary venous congestion . Electronically Signed   By: Maisie Fus  Register   On: 12/23/2015 07:18   Dg Chest Port 1 View  12/22/2015  CLINICAL DATA:  Continued assessment of pleural effusion. EXAM: PORTABLE CHEST 1 VIEW COMPARISON:  12/21/2015. FINDINGS: Cardiomegaly. Large RIGHT pleural effusion is increased. Compressive atelectasis at the RIGHT base. Trace LEFT  effusion may be improved. Overall improved vascular congestion. IMPRESSION: Increasing RIGHT pleural effusion otherwise improved exam. Electronically Signed   By: Elsie Stain [name]   On: 12/22/2015 07:27   Dg Chest Port 1 View  12/21/2015  CLINICAL DATA:  Community-acquired pneumonia EXAM: PORTABLE CHEST 1 VIEW COMPARISON:  Chest radiograph from one day prior. FINDINGS: Stable cardiomediastinal silhouette with mild cardiomegaly. No pneumothorax. Moderate right and small left pleural effusions, increased bilaterally. Mild pulmonary edema, increased. Patchy bibasilar lung consolidation, right greater than left, slightly worsened bilaterally. IMPRESSION: 1. Patchy bibasilar lung consolidation, right greater than left, slightly worsened bilaterally, likely a combination of atelectasis and pneumonia. 2. Stable mild cardiomegaly with increased mild pulmonary edema. 3. Moderate right and small left pleural effusions, increased bilaterally. Electronically Signed   By: Delbert Phenix [name]   On: 12/21/2015 08:39   Dg Chest Port 1 View  12/20/2015  CLINICAL DATA:  Shortness of breath EXAM: PORTABLE CHEST 1 VIEW COMPARISON:  12/18/2015 FINDINGS: New right lower lung opacity, likely combination of atelectasis/ pneumonia and layering pleural effusion. Mild left basilar opacity, possibly atelectasis. Cardiomegaly.  No frank interstitial edema. No pneumothorax. IMPRESSION: New right lower lung opacity, likely combination of atelectasis/ pneumonia and layering pleural effusion. Mild left basilar opacity, possibly atelectasis. Electronically Signed   By: Charline Bills [name]   On: 12/20/2015 11:53   Dg Chest Port 1 View  12/18/2015  CLINICAL DATA:  Productive cough for 2 weeks, shortness of breath, headache EXAM: PORTABLE CHEST 1 VIEW COMPARISON:  08/11/2014 FINDINGS: Borderline cardiomegaly. No pulmonary edema. There is streaky left base retrocardiac atelectasis or early infiltrate. Atherosclerotic but for sclerotic  calcifications of thoracic aorta. IMPRESSION: Streaky left base retrocardiac atelectasis or early infiltrate. No pulmonary edema. Follow-up to resolution recommended. Electronically Signed   By: Natasha Mead [name]   On: 12/18/2015 09:38   US Thoracentesis Asp Pleural Space W/img Guide  12/28/2015  CLINICAL DATA:  Recurrent pleural effusion, shortness of Breath EXAM: EXAM THORACENTESIS WITH ULTRASOUND TECHNIQUE: The procedure, risks (including but not limited to bleeding, infection, organ damage ), benefits, and alternatives were explained to the patient. Questions regarding the procedure were encouraged and answered. The patient understands and consents to the procedure. Survey ultrasound of the right hemithorax was performed and an appropriate skin entry site was localized. Site was marked, prepped with Betadine, draped in usual sterile fashion, infiltrated locally with 1% lidocaine. The Safe-T-Centesis needle was advanced into the pleural space. Clear thin fluid returned. 880 mL was removed. Samples sent for requested laboratory studies. The patient tolerated procedure well. COMPLICATIONS: COMPLICATIONS None immediate IMPRESSION: Technically successful ultrasound-guided right thoracentesis. Follow-up chest radiograph pending. Electronically Signed   By: Corlis Leak [name]   On: 12/28/2015 17:00    Time Spent in minutes  35.   Richarda Overlie M.D on 12/31/2015 at 8:41 AM  Between 7am to 7pm - Pager - [phone]  After 7pm go to [email or web address] - password [facility]  [facility] -  Office  [phone]

## 2015-12-31 NOTE — Progress Notes (Signed)
PT Cancellation Note  Patient Details Name: [name] MRN: [id number] DOB: [birth year]   Cancelled Treatment:    Reason Eval/Treat Not Completed: Patient at procedure or test/unavailable. Pt off the unit at CT.  Will check back as schedule permits.   Adileny Delon LUBECK 12/31/2015, 2:12 PM

## 2015-12-31 NOTE — Progress Notes (Addendum)
CARDIAC REHAB PHASE I   Pt in bed, states she is waiting to go down to CT, declines ambulation at this time (second attempt today). PT assigned to pt this afternoon as well. Will return today as schedule permits, if not, will follow up tomorrow.   Joylene GrapesEmily C Tekisha Darcey, RN, BSN 12/31/2015 1:23 PM

## 2016-01-01 LAB — COMPREHENSIVE METABOLIC PANEL
ALBUMIN: 2.7 g/dL — AB (ref 3.5–5.0)
ALK PHOS: 84 U/L (ref 38–126)
ALT: 12 U/L — AB (ref 14–54)
AST: 19 U/L (ref 15–41)
Anion gap: 9 (ref 5–15)
BILIRUBIN TOTAL: 0.4 mg/dL (ref 0.3–1.2)
BUN: 36 mg/dL — AB (ref 6–20)
CO2: 25 mmol/L (ref 22–32)
CREATININE: 1.56 mg/dL — AB (ref 0.44–1.00)
Calcium: 8.8 mg/dL — ABNORMAL LOW (ref 8.9–10.3)
Chloride: 103 mmol/L (ref 101–111)
GFR calc Af Amer: 35 mL/min — ABNORMAL LOW (ref >60)
GFR, EST NON AFRICAN AMERICAN: 30 mL/min — AB (ref >60)
GLUCOSE: 102 mg/dL — AB (ref 65–99)
POTASSIUM: 5.2 mmol/L — AB (ref 3.5–5.1)
Sodium: 137 mmol/L (ref 135–145)
TOTAL PROTEIN: 5.4 g/dL — AB (ref 6.5–8.1)

## 2016-01-01 LAB — CBC
HEMATOCRIT: 36.4 % (ref 36.0–46.0)
Hemoglobin: 11.1 g/dL — ABNORMAL LOW (ref 12.0–15.0)
MCH: 27.9 pg (ref 26.0–34.0)
MCHC: 30.5 g/dL (ref 30.0–36.0)
MCV: 91.5 fL (ref 78.0–100.0)
PLATELETS: 296 10*3/uL (ref 150–400)
RBC: 3.98 MIL/uL (ref 3.87–5.11)
RDW: 15.5 % (ref 11.5–15.5)
WBC: 8 10*3/uL (ref 4.0–10.5)

## 2016-01-01 LAB — GLUCOSE, CAPILLARY: Glucose-Capillary: 108 mg/dL — ABNORMAL HIGH (ref 65–99)

## 2016-01-01 MED ORDER — METOPROLOL TARTRATE 25 MG PO TABS: 25.0000 mg | ORAL_TABLET | Freq: Two times a day (BID) | ORAL | Status: DC

## 2016-01-01 MED ORDER — TACROLIMUS 1 MG PO CAPS: 1.0000 mg | ORAL_CAPSULE | Freq: Every morning | ORAL | Status: DC

## 2016-01-01 MED ORDER — TACROLIMUS 0.5 MG PO CAPS: ORAL_CAPSULE | ORAL | Status: DC

## 2016-01-01 MED ORDER — AMOXICILLIN-POT CLAVULANATE 875-125 MG PO TABS: 1.0000 | ORAL_TABLET | Freq: Two times a day (BID) | ORAL | Status: DC

## 2016-01-01 MED ORDER — FUROSEMIDE 20 MG PO TABS: 60.0000 mg | ORAL_TABLET | Freq: Two times a day (BID) | ORAL | Status: DC

## 2016-01-01 NOTE — Progress Notes (Signed)
Pt has orders to be discharged. Discharge instructions given and pt has no additional questions at this time. Medication regimen reviewed and pt educated. Pt verbalized understanding and has no additional questions. Telemetry box removed. IV removed and site in good condition. Pt stable and waiting for transportation.   Siedah Sedor RN 

## 2016-01-01 NOTE — Discharge Summary (Addendum)
Physician Discharge Summary  [name] MRN: [id number] DOB/AGE: 1932-01-25 82 y.o.  PCP: Larey Dresser, MD   Admit date: 12/18/2015 Discharge date: 01/01/2016  Discharge Diagnoses:     Principal Problem:   Pneumococcal pneumonia ([place]) Active Problems:   S/P kidney transplant   Diastolic CHF ([place])   CAD S/P remote RCA PCI   Hypertension   Elevated troponin   Bacteremia   CAP (community acquired pneumonia)   Pleural effusion on right   CKD (chronic kidney disease) stage 3, GFR 30-59 ml/min   PAF (paroxysmal atrial fibrillation) (HCC)   Tachyarrhythmia   Acute on chronic diastolic congestive heart failure ([place])    Follow-up recommendations Follow-up with PCP in 3-5 days , including all  additional recommended appointments as below Follow-up CBC, CMP in 3-5 days Patient to follow-up with Dr. Debara Pickett in one week , atrial fibrillation Check Prograf levels in one to 2 days,Therapeutic goal 4-6  Patient would need follow-up with Dr. Halford Chessman in one week for the right pleural effusion     Current Discharge Medication List    START taking these medications   Details  amoxicillin-clavulanate (AUGMENTIN) 875-125 MG tablet Take 1 tablet by mouth 2 (two) times daily. Qty: 10 tablet, Refills: 0    apixaban (ELIQUIS) 2.5 MG TABS tablet Take 1 tablet (2.5 mg total) by mouth 2 (two) times daily. Qty: 60 tablet, Refills: 11    atorvastatin (LIPITOR) 10 MG tablet Take 1 tablet (10 mg total) by mouth daily at 6 PM. Qty: 30 tablet, Refills: 1    diltiazem (CARDIZEM CD) 240 MG 24 hr capsule Take 1 capsule (240 mg total) by mouth daily. Qty: 30 capsule, Refills: 1    guaiFENesin (MUCINEX) 600 MG 12 hr tablet Take 2 tablets (1,200 mg total) by mouth 2 (two) times daily. Qty: 20 tablet, Refills: 1      CONTINUE these medications which have CHANGED   Details  furosemide (LASIX) 20 MG tablet Take 3 tablets (60 mg total) by mouth 2 (two) times daily. Qty: 60 tablet, Refills: 1     metoprolol tartrate (LOPRESSOR) 25 MG tablet Take 1 tablet (25 mg total) by mouth 2 (two) times daily. Qty: 60 tablet, Refills: 1    tacrolimus (PROGRAF) 0.5 MG capsule 1.5 mg in the morning and 1 mg at bedtime Qty: 120 capsule, Refills: 0      CONTINUE these medications which have NOT CHANGED   Details  allopurinol (ZYLOPRIM) 100 MG tablet Take 100 mg by mouth at bedtime.    colchicine 0.6 MG tablet Take 0.6 mg by mouth daily as needed. For gout    famotidine (PEPCID) 20 MG tablet Take 20 mg by mouth 2 (two) times daily.    mycophenolate (CELLCEPT) 250 MG capsule Take 500 mg by mouth 2 (two) times daily.    senna-docusate (SENOKOT-S) 8.6-50 MG per tablet Take 1 tablet by mouth daily as needed for mild constipation. Qty: 30 tablet, Refills: 3    aspirin EC 81 MG tablet Take 81 mg by mouth daily.    cinacalcet (SENSIPAR) 90 MG tablet Take 30 mg by mouth daily.     pantoprazole (PROTONIX) 20 MG tablet Take 1 tablet (20 mg total) by mouth daily. Qty: 30 tablet, Refills: 0      STOP taking these medications     simvastatin (ZOCOR) 20 MG tablet      amLODipine (NORVASC) 10 MG tablet      chlorpheniramine-HYDROcodone (TUSSIONEX) 10-8 MG/5ML [facility]  Discharge Condition: Stable    Discharge Instructions Get Medicines reviewed and adjusted: Please take all your medications with you for your next visit with your Primary MD  Please request your Primary MD to go over all hospital tests and procedure/radiological results at the follow up, please ask your Primary MD to get all Hospital records sent to his/her office.  If you experience worsening of your admission symptoms, develop shortness of breath, life threatening emergency, suicidal or homicidal thoughts you must seek medical attention immediately by calling 911 or calling your MD immediately if symptoms less severe.  You must read complete instructions/literature along with all the possible adverse reactions/side  effects for all the Medicines you take and that have been prescribed to you. Take any new Medicines after you have completely understood and accpet all the possible adverse reactions/side effects.   Do not drive when taking Pain medications.   Do not take more than prescribed Pain, Sleep and Anxiety Medications  Special Instructions: If you have smoked or chewed Tobacco in the last 2 yrs please stop smoking, stop any regular Alcohol and or any Recreational drug use.  Wear Seat belts while driving.  Please note  You were cared for by a hospitalist during your hospital stay. Once you are discharged, your primary care physician will handle any further medical issues. Please note that NO REFILLS for any discharge medications will be authorized once you are discharged, as it is imperative that you return to your primary care physician (or establish a relationship with a primary care physician if you do not have one) for your aftercare needs so that they can reassess your need for medications and monitor your lab values.  Discharge Instructions    AMB Referral to [facility] Care Management    Complete by:  As directed   Reason for consult:  Post hospital monitoring;  Diagnoses of:  COPD/ Pneumonia  Expected date of contact:  1-3 days (reserved for hospital discharges)  Please assign to community nurse for transition of care calls and assess for home visits. Patient lives alone but states her daughter can assist her as well.  New home oxygen. Adv home Care.  Questions please call:   Natividad Brood, RN BSN [facility] Liaison  [phone] business mobile phone Toll free office [phone]     Amb Referral to Cardiac Rehabilitation    Complete by:  As directed   Diagnosis:  NSTEMI            Allergies  Allergen Reactions  . Sulfa Drugs Cross Reactors Swelling      Disposition: Home with home health   Consults:  Cardiology Nephrology PCCM    Significant  Diagnostic Studies:  Dg Chest 1 View  12/30/2015  CLINICAL DATA:  Follow-up pleural effusion, history of multiple thoracentesis ease, history of CHF, coronary artery disease and renal transplantation EXAM: CHEST 1 VIEW COMPARISON:  Chest x-ray of Dec 28, 2015 FINDINGS: There is a small right pleural effusion which appears larger than on the previous study. There is no mediastinal shift. There is no significant left pleural effusion. The cardiac silhouette remains enlarged. The central pulmonary vascularity remains prominent. The trachea is midline. There are degenerative changes of both shoulders. IMPRESSION: Small right pleural effusion which has increased in size since the study of 2 days ago. There is stable cardiomegaly without pulmonary edema. Electronically Signed   By: David  Martinique [name]   On: 12/30/2015 08:23   Dg Chest 1 View  12/28/2015  CLINICAL DATA:  Status post right thoracentesis EXAM: CHEST 1 VIEW COMPARISON:  Chest radiograph from earlier today. FINDINGS: Stable cardiomediastinal silhouette with mild-to-moderate cardiomegaly. No pneumothorax. Small residual right pleural effusion, significantly decreased. No left pleural effusion. No pulmonary edema. Improved aeration at the right lung base with residual mild right basilar atelectasis. IMPRESSION: 1. Small residual right pleural effusion, significantly decreased. No pneumothorax. 2. Improved right lung base aeration with residual mild right basilar atelectasis. 3. Stable mild-to-moderate cardiomegaly without pulmonary edema. Electronically Signed   By: Ilona Sorrel [name]   On: 12/28/2015 14:50   Dg Chest 2 View  12/31/2015  CLINICAL DATA:  Right pleural effusion. EXAM: CHEST  2 VIEW COMPARISON:  Dec 30, 2015. FINDINGS: Stable cardiomediastinal silhouette. Atherosclerosis of thoracic aorta is noted no pneumothorax is noted. Left lung is clear. Increased right middle lobe opacity is noted concerning for worsening atelectasis or pneumonia. Mild  right pleural effusion is noted. Bony thorax is unremarkable. IMPRESSION: Right middle lobe pneumonia or atelectasis is noted. Mild right pleural effusion is noted. Electronically Signed   By: Marijo Conception, [name]   On: 12/31/2015 11:22   Dg Chest 2 View  12/28/2015  CLINICAL DATA:  Shortness of breath EXAM: CHEST  2 VIEW COMPARISON:  12/26/2015 chest radiograph. FINDINGS: Surgical clips overlie the lower left axilla. Stable cardiomediastinal silhouette with mild-to-moderate cardiomegaly. No pneumothorax. Moderate right pleural effusion is increased. No left pleural effusion. No pulmonary edema. Worsening right basilar lung opacity, favor atelectasis. IMPRESSION: 1. Stable mild-to-moderate cardiomegaly without pulmonary edema. 2. Moderate right pleural effusion, increased. 3. Worsened right basilar lung opacity, favor atelectasis. Electronically Signed   By: Ilona Sorrel [name]   On: 12/28/2015 13:07   Dg Chest 2 View  12/26/2015  CLINICAL DATA:  SOB WITH EXERTION, PLEURAL EFFUSION,HX DM,CAD,KIDNEY TRANSPLANT EXAM: CHEST  2 VIEW COMPARISON:  12/25/2015 FINDINGS: Heart is enlarged. There has been some improvement in aeration of right lung base. Small right pleural effusion persists. Left lung is clear. IMPRESSION: 1. Improved aeration at the right lung base. 2. Right pleural effusion. 3. Cardiomegaly. Electronically Signed   By: Nolon Nations [name]   On: 12/26/2015 08:59   Ct Chest Wo Contrast  12/31/2015  CLINICAL DATA:  Right lower lobe lung mass. Pt states had some chest pain when coming to hospital 2 weeks ago and sob . Pt has not had any chest pain since being admitted 2 weeks ago. Pt states sob has gotten better over the past two weeks. EXAM: CT CHEST WITHOUT CONTRAST TECHNIQUE: Multidetector CT imaging of the chest was performed following the standard protocol without IV contrast. COMPARISON:  Chest radiograph, 12/31/2015.  Chest CT, 12/23/2015. FINDINGS: Neck base and axilla: No mass or adenopathy.  Thyroid gland is prominent and mildly heterogeneous suggesting small ill-defined nodules, stable from the prior study. Mediastinum and hila: Heart is mildly enlarged. There are dense coronary artery calcifications. There are prominent mediastinal lymph nodes. There is a 1 cm short axis right peritracheal node and a 9 mm short axis left paratracheal lymph node. Nodes are stable from the prior study. No mediastinal masses. No hilar masses or enlarged lymph nodes. Lungs and pleura: Complete atelectasis of the right lower lobe with significant atelectasis of the right middle lobe. The the proximal right middle lobe bronchus appears occluded which may be from mucous plugging. Small to moderate right pleural effusion is decreased in size from the prior CT. There is mild dependent right upper lobe opacity consistent with subsegmental atelectasis.  Subtle areas of ground-glass opacity are noted in the upper lobes and left lower lobe. Mild atelectasis is noted at the left lung base similar to the prior exam. Limited upper abdomen:  No acute findings. Musculoskeletal:  No osteoblastic or osteolytic lesions. IMPRESSION: 1. No convincing right lower lobe mass. This assessment is limited by lack of intravenous contrast. 2. Right lower lobe atelectasis and significant atelectasis of the right middle lobe similar to the prior CT. 3. Small to moderate right pleural effusion decreased in size from the prior CT. 4. Borderline mediastinal adenopathy, likely reactive, stable from the prior CT. 5. No convincing pneumonia or pulmonary edema. Electronically Signed   By: Lajean Manes [name]   On: 12/31/2015 15:02   Ct Chest Wo Contrast  12/23/2015  CLINICAL DATA:  Shortness of breath. Pleural effusion. Congestive heart failure and diabetes. EXAM: CT CHEST WITHOUT CONTRAST TECHNIQUE: Multidetector CT imaging of the chest was performed following the standard protocol without IV contrast. COMPARISON:  Chest radiograph dated 12/23/2015  FINDINGS: Mediastinum/Lymph Nodes: No masses or pathologically enlarged lymph nodes identified on this un-enhanced exam. The heart is enlarged. There is a small pericardial effusion. Atherosclerotic disease of the coronary arteries and thoracic aorta is noted. Layering material fills the esophagus dependently. Lungs/Pleura: There is a moderate to large water density right pleural effusion. There is right lower lobe airspace consolidation versus lobar atelectasis. There is patchy airspace consolidation in the basilar segments of the left lower lobe. Upper abdomen: No acute findings. Musculoskeletal: No chest wall mass or suspicious bone lesions identified. IMPRESSION: Large right pleural effusion with right lower lobe collapse. Patchy airspace disease within the dependent segments of the left lower lobe. Heterogeneous material filling the distal 2/3 of the esophagus. These findings raise the possibility of aspiration pneumonia. Endobronchial lesion causing collapse of the right lower lobe is also in the differential diagnosis, although one is not visualized on this nonenhanced exam. Alternatively the large right pleural effusion may be causing secondary compressive atelectasis of the right lower lobe. Enlarged cardiac silhouette. Atherosclerotic disease of the coronary arteries and aorta. Electronically Signed   By: Fidela Salisbury [name]   On: 12/23/2015 14:41   Nm Myocar Multi W/spect W/wall Motion / Ef  12/26/2015  CLINICAL DATA:  Coronary artery disease, CHF, hypertension, diabetes, chest pain, shortness of Breath EXAM: MYOCARDIAL IMAGING WITH SPECT (REST AND PHARMACOLOGIC-STRESS) GATED LEFT VENTRICULAR WALL MOTION STUDY LEFT VENTRICULAR EJECTION FRACTION TECHNIQUE: Standard myocardial SPECT imaging was performed after resting intravenous injection of 10 mCi [id number]. Subsequently, intravenous infusion of Lexiscan was performed under the supervision of the Cardiology staff. At peak effect of the  drug, 30 mCi [id number] was injected intravenously and standard myocardial SPECT imaging was performed. Quantitative gated imaging was also performed to evaluate left ventricular wall motion, and estimate left ventricular ejection fraction. COMPARISON:  None. FINDINGS: Perfusion: There is a small area of moderately decreased activity in the inferior wall of the left ventricular myocardium on both the resting and stress images. No suggestion of ischemia. Wall Motion: No left ventricular enlargement. There is decreased myocardial thickening in wall motion involving the septum and inferior wall of the left ventricle. Left Ventricular Ejection Fraction: 52 % End diastolic volume 94 ml End systolic volume 45 ml IMPRESSION: 1. Probable inferior wall infarct.  No evidence of ischemia. 2. Mild inferior wall and septal hypokinesis. 3. Left ventricular ejection fraction 52% 4. Low-risk stress test findings*. *2012 Appropriate Use Criteria for Coronary Revascularization Focused Update: J Am  Coll Cardiol. 7989;21(1):941-740. http://content.[email or web address].aspx?[email or web address] Electronically Signed   By: Lucrezia Europe [name]   On: 12/26/2015 16:52   Dg Chest Port 1 View  12/25/2015  CLINICAL DATA:  Pleural effusion.  Thoracentesis. EXAM: PORTABLE CHEST 1 VIEW COMPARISON:  12/24/2015.  CT 12/23/2015. FINDINGS: Mediastinum and hilar structures are stable. Stable cardiomegaly. Continued re-expansion of right base atelectasis. Right base infiltrate present. Small right pleural effusion. No pneumothorax. IMPRESSION: 1. Continued re-expansion of right base atelectasis. Right base infiltrates present. Small right pleural effusion. 2. Stable cardiomegaly.  No pulmonary venous congestion . Electronically Signed   By: Marcello Moores  Register   On: 12/25/2015 07:42   Dg Chest Port 1 View  12/24/2015  CLINICAL DATA:  Status post right thoracentesis EXAM: PORTABLE CHEST 1 VIEW COMPARISON:  Earlier today FINDINGS: Decreased right  pleural effusion, trace to small currently. No pneumothorax or re-expansion edema. Patchy opacity in the right base along its is presumably incomplete resolution of atelectasis given findings of right middle and lower lobe collapse on chest CT 12/23/2015. The left lung is clear. Stable aortic contours. IMPRESSION: 1. No acute finding after right thoracentesis. There is minimal residual fluid. 2. Partial re-expansion of the collapsed right middle and lower lobe seen on CT yesterday. Electronically Signed   By: Monte Fantasia [name]   On: 12/24/2015 13:59   Dg Chest Port 1 View  12/24/2015  CLINICAL DATA:  Shortness of breath.  Pleural effusion . EXAM: PORTABLE CHEST 1 VIEW COMPARISON:  CT 12/23/2015.  Chest x-ray 12/23/2015. FINDINGS: Mediastinum hilar structures are stable. Stable cardiomegaly. Stable right lower lobe atelectasis and/or consolidation and right pleural effusion. Left lung is clear. No pneumothorax. IMPRESSION: 1. Stable right lower lobe atelectasis and/or consolidation and right pleural effusion. 2. Stable cardiomegaly. Electronically Signed   By: Marcello Moores  Register   On: 12/24/2015 07:11   Dg Chest Port 1 View  12/23/2015  CLINICAL DATA:  Pneumonia. EXAM: PORTABLE CHEST 1 VIEW COMPARISON:  12/22/2015. FINDINGS: Stable cardiomegaly. Progressive infiltrate right lower lobe. Progressive right pleural effusion. Mild left base subsegmental atelectasis and or infiltrate. Left hemidiaphragm not imaged. No pneumothorax. IMPRESSION: 1. Progressive right lower lobe infiltrate and right pleural effusion. 2.  Left base subsegmental atelectasis and or mild infiltrate. 3.  Cardiomegaly.  No pulmonary venous congestion . Electronically Signed   By: Marcello Moores  Register   On: 12/23/2015 07:18   Dg Chest Port 1 View  12/22/2015  CLINICAL DATA:  Continued assessment of pleural effusion. EXAM: PORTABLE CHEST 1 VIEW COMPARISON:  12/21/2015. FINDINGS: Cardiomegaly. Large RIGHT pleural effusion is increased.  Compressive atelectasis at the RIGHT base. Trace LEFT effusion may be improved. Overall improved vascular congestion. IMPRESSION: Increasing RIGHT pleural effusion otherwise improved exam. Electronically Signed   By: Staci Righter [name]   On: 12/22/2015 07:27   Dg Chest Port 1 View  12/21/2015  CLINICAL DATA:  Community-acquired pneumonia EXAM: PORTABLE CHEST 1 VIEW COMPARISON:  Chest radiograph from one day prior. FINDINGS: Stable cardiomediastinal silhouette with mild cardiomegaly. No pneumothorax. Moderate right and small left pleural effusions, increased bilaterally. Mild pulmonary edema, increased. Patchy bibasilar lung consolidation, right greater than left, slightly worsened bilaterally. IMPRESSION: 1. Patchy bibasilar lung consolidation, right greater than left, slightly worsened bilaterally, likely a combination of atelectasis and pneumonia. 2. Stable mild cardiomegaly with increased mild pulmonary edema. 3. Moderate right and small left pleural effusions, increased bilaterally. Electronically Signed   By: Ilona Sorrel [name]   On: 12/21/2015 08:39   Dg Chest [facility]  12/20/2015  CLINICAL DATA:  Shortness of breath EXAM: PORTABLE CHEST 1 VIEW COMPARISON:  12/18/2015 FINDINGS: New right lower lung opacity, likely combination of atelectasis/ pneumonia and layering pleural effusion. Mild left basilar opacity, possibly atelectasis. Cardiomegaly.  No frank interstitial edema. No pneumothorax. IMPRESSION: New right lower lung opacity, likely combination of atelectasis/ pneumonia and layering pleural effusion. Mild left basilar opacity, possibly atelectasis. Electronically Signed   By: Julian Hy [name]   On: 12/20/2015 11:53   Dg Chest Port 1 View  12/18/2015  CLINICAL DATA:  Productive cough for 2 weeks, shortness of breath, headache EXAM: PORTABLE CHEST 1 VIEW COMPARISON:  08/11/2014 FINDINGS: Borderline cardiomegaly. No pulmonary edema. There is streaky left base retrocardiac atelectasis or  early infiltrate. Atherosclerotic but for sclerotic calcifications of thoracic aorta. IMPRESSION: Streaky left base retrocardiac atelectasis or early infiltrate. No pulmonary edema. Follow-up to resolution recommended. Electronically Signed   By: Lahoma Crocker [name]   On: 12/18/2015 09:38   US Thoracentesis Asp Pleural Space W/img Guide  12/28/2015  CLINICAL DATA:  Recurrent pleural effusion, shortness of Breath EXAM: EXAM THORACENTESIS WITH ULTRASOUND TECHNIQUE: The procedure, risks (including but not limited to bleeding, infection, organ damage ), benefits, and alternatives were explained to the patient. Questions regarding the procedure were encouraged and answered. The patient understands and consents to the procedure. Survey ultrasound of the right hemithorax was performed and an appropriate skin entry site was localized. Site was marked, prepped with Betadine, draped in usual sterile fashion, infiltrated locally with 1% lidocaine. The Safe-T-Centesis needle was advanced into the pleural space. Clear thin fluid returned. 880 mL was removed. Samples sent for requested laboratory studies. The patient tolerated procedure well. COMPLICATIONS: COMPLICATIONS None immediate IMPRESSION: Technically successful ultrasound-guided right thoracentesis. Follow-up chest radiograph pending. Electronically Signed   By: Lucrezia Europe [name]   On: 12/28/2015 17:00    2-D echo LV EF: 55% - 60%  ------------------------------------------------------------------- Indications: Atrial fibrillation - 427.31.  ------------------------------------------------------------------- History: PMH: Dyspnea. Coronary artery disease. Risk factors: Chronic kidney disease, status post renal transplant. Hypertension. Diabetes mellitus. Dyslipidemia.  ------------------------------------------------------------------- Study Conclusions  - Left ventricle: The cavity size was normal. Wall thickness was  increased in a  pattern of severe LVH. Systolic function was  normal. The estimated ejection fraction was in the range of 55%  to 60%. Wall motion was normal; there were no regional wall  motion abnormalities. - Mitral valve: There was mild regurgitation. - Left atrium: The atrium was moderately dilated. - Tricuspid valve: There was moderate regurgitation. - Pulmonary arteries: Systolic pressure was moderately increased.  PA peak pressure: 44 mm Hg (S).  Impressions:  - Normal LV systolic function; severe LVH; moderate LAE; mild MR;  moderate TR; moderately elevated pulmonary pressure.   Filed Weights   12/30/15 0425 12/31/15 0623 01/01/16 0500  Weight: 74.254 kg (163 lb 11.2 oz) 74.571 kg (164 lb 6.4 oz) 73.936 kg (163 lb)     Microbiology: Recent Results (from the past 240 hour(s))  Culture, blood (Routine X 2) w Reflex to ID Panel     Status: None   Collection Time: 12/22/15 10:58 AM  Result Value Ref Range Status   Specimen Description BLOOD RIGHT HAND  Final   Special Requests IN PEDIATRIC BOTTLE  3CC  Final   Culture NO GROWTH 5 DAYS  Final   Report Status 12/27/2015 FINAL  Final  Culture, blood (Routine X 2) w Reflex to ID Panel     Status: None   Collection Time: 12/22/15  11:00 AM  Result Value Ref Range Status   Specimen Description BLOOD RIGHT HAND  Final   Special Requests IN PEDIATRIC BOTTLE 3.0CC  Final   Culture NO GROWTH 5 DAYS  Final   Report Status 12/27/2015 FINAL  Final  Gram stain     Status: None   Collection Time: 12/24/15  9:01 PM  Result Value Ref Range Status   Specimen Description PLEURAL  Final   Special Requests Immunocompromised  Final   Gram Stain   Final    CYTOSPIN SMEAR WBC PRESENT,BOTH PMN AND MONONUCLEAR NO ORGANISMS SEEN    Report Status 12/25/2015 FINAL  Final  Culture, body fluid-bottle     Status: None   Collection Time: 12/24/15  9:01 PM  Result Value Ref Range Status   Specimen Description PLEURAL  Final   Special Requests AEROBIC  BOTTLE ONLY  Final   Culture NO GROWTH 5 DAYS  Final   Report Status 12/30/2015 FINAL  Final  Culture, body fluid-bottle     Status: None (Preliminary result)   Collection Time: 12/28/15  2:30 PM  Result Value Ref Range Status   Specimen Description FLUID PLEURAL  Final   Special Requests NONE  Final   Culture NO GROWTH 3 DAYS  Final   Report Status PENDING  Incomplete  Gram stain     Status: None   Collection Time: 12/28/15  2:30 PM  Result Value Ref Range Status   Specimen Description FLUID PLEURAL  Final   Special Requests NONE  Final   Gram Stain   Final    FEW WBC PRESENT,BOTH PMN AND MONONUCLEAR NO ORGANISMS SEEN    Report Status 12/28/2015 FINAL  Final       Blood Culture    Component Value Date/Time   SDES FLUID PLEURAL 12/28/2015 1430   SDES FLUID PLEURAL 12/28/2015 1430   SPECREQUEST NONE 12/28/2015 1430   SPECREQUEST NONE 12/28/2015 1430   CULT NO GROWTH 3 DAYS 12/28/2015 1430   REPTSTATUS PENDING 12/28/2015 1430   REPTSTATUS 12/28/2015 FINAL 12/28/2015 1430      Labs: Results for orders placed or performed during the hospital encounter of 12/18/15 (from the past 48 hour(s))  Glucose, capillary     Status: Abnormal   Collection Time: 12/30/15 11:17 AM  Result Value Ref Range   Glucose-Capillary 138 (H) 65 - 99 mg/dL  Glucose, capillary     Status: None   Collection Time: 12/30/15  4:35 PM  Result Value Ref Range   Glucose-Capillary 93 65 - 99 mg/dL  Glucose, capillary     Status: Abnormal   Collection Time: 12/30/15  9:29 PM  Result Value Ref Range   Glucose-Capillary 123 (H) 65 - 99 mg/dL  Glucose, capillary     Status: Abnormal   Collection Time: 12/31/15  6:30 AM  Result Value Ref Range   Glucose-Capillary 120 (H) 65 - 99 mg/dL  Basic metabolic panel     Status: Abnormal   Collection Time: 12/31/15  6:37 AM  Result Value Ref Range   Sodium 139 135 - 145 mmol/L   Potassium 5.6 (H) 3.5 - 5.1 mmol/L   Chloride 105 101 - 111 mmol/L   CO2 27 22 -  32 mmol/L   Glucose, Bld 121 (H) 65 - 99 mg/dL   BUN 37 (H) 6 - 20 mg/dL   Creatinine, Ser 1.54 (H) 0.44 - 1.00 mg/dL   Calcium 9.1 8.9 - 10.3 mg/dL   GFR calc non Af Amer 30 (  L) >60 mL/min   GFR calc Af Amer 35 (L) >60 mL/min    Comment: (NOTE) The eGFR has been calculated using the CKD EPI equation. This calculation has not been validated in all clinical situations. eGFR's persistently <60 mL/min signify possible Chronic Kidney Disease.    Anion gap 7 5 - 15  Glucose, capillary     Status: Abnormal   Collection Time: 12/31/15 11:04 AM  Result Value Ref Range   Glucose-Capillary 125 (H) 65 - 99 mg/dL  Glucose, capillary     Status: None   Collection Time: 12/31/15  4:46 PM  Result Value Ref Range   Glucose-Capillary 94 65 - 99 mg/dL  Glucose, capillary     Status: Abnormal   Collection Time: 12/31/15  9:06 PM  Result Value Ref Range   Glucose-Capillary 108 (H) 65 - 99 mg/dL   Comment 1 Notify RN    Comment 2 Document in Chart   Comprehensive metabolic panel     Status: Abnormal   Collection Time: 01/01/16  3:45 AM  Result Value Ref Range   Sodium 137 135 - 145 mmol/L   Potassium 5.2 (H) 3.5 - 5.1 mmol/L   Chloride 103 101 - 111 mmol/L   CO2 25 22 - 32 mmol/L   Glucose, Bld 102 (H) 65 - 99 mg/dL   BUN 36 (H) 6 - 20 mg/dL   Creatinine, Ser 1.56 (H) 0.44 - 1.00 mg/dL   Calcium 8.8 (L) 8.9 - 10.3 mg/dL   Total Protein 5.4 (L) 6.5 - 8.1 g/dL   Albumin 2.7 (L) 3.5 - 5.0 g/dL   AST 19 15 - 41 U/L   ALT 12 (L) 14 - 54 U/L   Alkaline Phosphatase 84 38 - 126 U/L   Total Bilirubin 0.4 0.3 - 1.2 mg/dL   GFR calc non Af Amer 30 (L) >60 mL/min   GFR calc Af Amer 35 (L) >60 mL/min    Comment: (NOTE) The eGFR has been calculated using the CKD EPI equation. This calculation has not been validated in all clinical situations. eGFR's persistently <60 mL/min signify possible Chronic Kidney Disease.    Anion gap 9 5 - 15  CBC     Status: Abnormal   Collection Time: 01/01/16  3:45 AM   Result Value Ref Range   WBC 8.0 4.0 - 10.5 K/uL   RBC 3.98 3.87 - 5.11 MIL/uL   Hemoglobin 11.1 (L) 12.0 - 15.0 g/dL   HCT 36.4 36.0 - 46.0 %   MCV 91.5 78.0 - 100.0 fL   MCH 27.9 26.0 - 34.0 pg   MCHC 30.5 30.0 - 36.0 g/dL   RDW 15.5 11.5 - 15.5 %   Platelets 296 150 - 400 K/uL  Glucose, capillary     Status: Abnormal   Collection Time: 01/01/16  6:02 AM  Result Value Ref Range   Glucose-Capillary 108 (H) 65 - 99 mg/dL     Lipid Panel     Component Value Date/Time   CHOL 101 12/19/2015 0242   TRIG 83 12/19/2015 0242   HDL 34* 12/19/2015 0242   CHOLHDL 3.0 12/19/2015 0242   VLDL 17 12/19/2015 0242   LDLCALC 50 12/19/2015 0242     Lab Results  Component Value Date   HGBA1C 6.9* 12/18/2015     Lab Results  Component Value Date   LDLCALC 50 12/19/2015   CREATININE 1.56* 01/01/2016    82 y.o. female with a history of CAD, HTN, DM2, CKD s/p renal  transplant in 2003, admitted on 5/18 after presenting with shortness of breath and chest pain, found to have A fib with RVR (CHADVASC 7), as well as NSTEMI from demand ischemia. Also had a pneumococcal pneumonia Hospital course complicated due to development of acute hypoxic respiratory failure with pneumococcal pneumonia/parapneumonic effusion. Marland Kitchen PCCM involving care. Nephrology consult and given worsened renal function. Patient transferred to hospitalist service for further care.Patient status post bedside thoracentesis on 5/24. Scheduled for Lexiscan Myoview on 5/26     Acute respiratory failure with hypoxia secondary to pneumococcal pneumonia/Strep pneumo bacteremia and pleural effusion s/p thoracentesis bedside on 5/24 with 1460 removed, clear yellow. Likely exudative by LDH, repeat chest x-ray shows reexpansion of the right base, follow pleural fluid culture from 5/24, no growth from blood culture from 5/22. Last positive blood culture was on 5/18 given IV Rocephin (10 day course) starting 5/18 For strep  pneumococcal bacteremia.  completed on 5/28. However patient had reaccumulation of a parapneumonic effusion and had another 862m's of pleural fluid removed On 5/28. Therefore antibiotics are being resumed.PCCM Has been reconsulted For further evaluation. Repeat chest x-ray that shows slight increase in the right-sided pleural effusion. CT scan on 5/23 showed showed a large right pleural effusion with right lower lobe collapse and patchy airspace disease of the left lower lobe, finding suspicion for aspiration pneumonia, endobronchial lesion causing collapse of the right lower lobe is also in the differential diagnosis  Patient still quite hypoxic requiring 2-3 L of oxygen with ambulation Repeat CT scan of the chest without contrast on 5/31 showed no convincing right lower lobe mass, small to moderate right pleural effusion. Pulmonary recommended aggressive diuresis to help manage her pleural effusion Patient is also being sent home on another 5 days of Augmentin. She will need close outpatient pulmonary follow-up  to assess continued need for thoracentesis   Acute on Chronic kidney disease stage III-baseline around 1.4 Continue Prograf. Dosage being adjusted by nephrology. Prograf level on 5/25 was 8. Repeat Prograf level still pending. Creatinine 2.32>1.54, suspected to hemodynamically mediated with atrial fibrillation/rapid ventricular response vs ATN, approaching baseline Status post renal transplant in 2003 maintained on Tac/MMF  Continue to monitor Prograf levels, currently on Lasix 60 mg twice daily  Monitor Prograf levels, goal 4-6   A. fib with RVR,CHA2DS2VASc = 7 Required Cardizem drip on admission. Now stable on oral Cardizem and metoprolol. 2-D echo showed normal EF. now in sinus rhythm. Treated with IV heparin and subsequently switched to Eliquis and follow-up with Dr. HDebara Pickettin one week. Dose of metoprolol reduced to 25 twice a day for due to bradycardia    NSTE  MI,s/p RCA BMS in 2002 - residual LAD and Diag dzs Likely secondary to demand ischemia with rapid A. fib/pneumonia. given renal transplant h/o and risk of nephrotoxicity of contrast dye, decided to do Myoview instead Lexiscan Myoview 5/26 -showed inferior wall infarct with no evidence of reversible ischemia and EF of 52%   Acute on Chronic Diastolic CHF: BNP was abnormal at 1200 on admit. . Renal function fluctuating. She was on 80 mg PO lasix as an outpatient. Dose increased. . hold ARB.   diabetes mellitus type II-stable Received sliding scale coverage. Hemoglobin A1c 6.9  physical deconditioning PT recommended home health          Discharge Exam:    Blood pressure 136/70, pulse 63, temperature 97.9 F (36.6 C), temperature source Oral, resp. rate 18, height 5' 3" (1.6 m), weight 73.936 kg (163 lb), SpO2  98 %.   General: Well developed, well nourished, female appearing in no acute distress. Head: Normocephalic, atraumatic. Neck: Supple without bruits, JVD not elevated. Lungs: Resp regular and unlabored, rhonchi at bases. Heart: RRR, S1, S2, no S3, S4, or murmur; no rub. Abdomen: Soft, non-tender, non-distended with normoactive bowel sounds. No hepatomegaly. No rebound/guarding. No obvious abdominal masses. Extremities: No clubbing, cyanosis, or edema. Distal pedal pulses are 2+ bilaterally. Neuro: Alert and oriented X 3. Moves all extremities spontaneously.   Follow-up Information    Follow up with Pixie Casino, MD. Schedule an appointment as soon as possible for a visit in 1 week.   Specialty:  Cardiology   Why:  Hospital follow-up   Contact information:   [address] [place] [address] [phone]       Follow up with PCP. Schedule an appointment as soon as possible for a visit in 1 week.      Follow up with [facility].   Contact information:   [address] [place] [phone]       Follow up with [facility].   Why:  [facility]/OT /HHA. Nurse will contact you to arrange inital visit 24-48 after discharge home   Contact information:   [address] [place] [address] [phone]       Follow up with SOOD,VINEET, MD. Schedule an appointment as soon as possible for a visit in 1 week.   Specialty:  Pulmonary Disease   Why:  Call for appointment in 1 week for outpatient follow-up   Contact information:   [address] [place] [address] [phone]       Signed: Reyne Dumas 01/01/2016, 8:39 AM        Time spent >45 mins

## 2016-01-02 LAB — CULTURE, BODY FLUID-BOTTLE

## 2016-01-02 LAB — CULTURE, BODY FLUID W GRAM STAIN -BOTTLE: Culture: NO GROWTH

## 2016-01-03 DIAGNOSIS — I130 Hypertensive heart and chronic kidney disease with heart failure and stage 1 through stage 4 chronic kidney disease, or unspecified chronic kidney disease: Secondary | ICD-10-CM | POA: Diagnosis not present

## 2016-01-03 DIAGNOSIS — Z87891 Personal history of nicotine dependence: Secondary | ICD-10-CM | POA: Diagnosis not present

## 2016-01-03 DIAGNOSIS — N183 Chronic kidney disease, stage 3 (moderate): Secondary | ICD-10-CM | POA: Diagnosis not present

## 2016-01-03 DIAGNOSIS — I4891 Unspecified atrial fibrillation: Secondary | ICD-10-CM | POA: Diagnosis not present

## 2016-01-03 DIAGNOSIS — Z940 Kidney transplant status: Secondary | ICD-10-CM | POA: Diagnosis not present

## 2016-01-03 DIAGNOSIS — Z7982 Long term (current) use of aspirin: Secondary | ICD-10-CM | POA: Diagnosis not present

## 2016-01-03 DIAGNOSIS — I5033 Acute on chronic diastolic (congestive) heart failure: Secondary | ICD-10-CM | POA: Diagnosis not present

## 2016-01-03 DIAGNOSIS — I2510 Atherosclerotic heart disease of native coronary artery without angina pectoris: Secondary | ICD-10-CM | POA: Diagnosis not present

## 2016-01-03 DIAGNOSIS — Z8701 Personal history of pneumonia (recurrent): Secondary | ICD-10-CM | POA: Diagnosis not present

## 2016-01-05 ENCOUNTER — Other Ambulatory Visit: Payer: Self-pay

## 2016-01-06 DIAGNOSIS — I130 Hypertensive heart and chronic kidney disease with heart failure and stage 1 through stage 4 chronic kidney disease, or unspecified chronic kidney disease: Secondary | ICD-10-CM | POA: Diagnosis not present

## 2016-01-06 DIAGNOSIS — N183 Chronic kidney disease, stage 3 (moderate): Secondary | ICD-10-CM | POA: Diagnosis not present

## 2016-01-06 DIAGNOSIS — I4891 Unspecified atrial fibrillation: Secondary | ICD-10-CM | POA: Diagnosis not present

## 2016-01-06 DIAGNOSIS — I5033 Acute on chronic diastolic (congestive) heart failure: Secondary | ICD-10-CM | POA: Diagnosis not present

## 2016-01-06 DIAGNOSIS — I2510 Atherosclerotic heart disease of native coronary artery without angina pectoris: Secondary | ICD-10-CM | POA: Diagnosis not present

## 2016-01-06 DIAGNOSIS — Z8701 Personal history of pneumonia (recurrent): Secondary | ICD-10-CM | POA: Diagnosis not present

## 2016-01-06 NOTE — Patient Outreach (Addendum)
Successful contact made with patient who identified herself by providing date of birth and address. Patient asked this RNCM for a return call in 15 minutes.  This RNCM called back in 20 minutes, patient did not answer call.  HIPPA compliant message left with this RNCM's contact information  Plan: Make another attempt to contact patient on Friday,June 9

## 2016-01-07 ENCOUNTER — Telehealth: Payer: Self-pay | Admitting: Internal Medicine

## 2016-01-07 DIAGNOSIS — I5033 Acute on chronic diastolic (congestive) heart failure: Secondary | ICD-10-CM | POA: Diagnosis not present

## 2016-01-07 DIAGNOSIS — I4891 Unspecified atrial fibrillation: Secondary | ICD-10-CM | POA: Diagnosis not present

## 2016-01-07 DIAGNOSIS — I130 Hypertensive heart and chronic kidney disease with heart failure and stage 1 through stage 4 chronic kidney disease, or unspecified chronic kidney disease: Secondary | ICD-10-CM | POA: Diagnosis not present

## 2016-01-07 DIAGNOSIS — N183 Chronic kidney disease, stage 3 (moderate): Secondary | ICD-10-CM | POA: Diagnosis not present

## 2016-01-07 DIAGNOSIS — I2510 Atherosclerotic heart disease of native coronary artery without angina pectoris: Secondary | ICD-10-CM | POA: Diagnosis not present

## 2016-01-07 DIAGNOSIS — Z8701 Personal history of pneumonia (recurrent): Secondary | ICD-10-CM | POA: Diagnosis not present

## 2016-01-07 NOTE — Telephone Encounter (Signed)
LVM

## 2016-01-07 NOTE — Telephone Encounter (Signed)
i called HH back and told them no until pt comes to a clinic appt, they will speak w/ pt

## 2016-01-07 NOTE — Telephone Encounter (Signed)
[facility] OT calls and ask for verbal approval for home OT, verbal approval given, do you agree?

## 2016-01-07 NOTE — Telephone Encounter (Signed)
No . She is not an active pt. Not seen since 2013. Her June admission was via the hospitalitis. She has appt later in week with Dr Isabella BowensKrall - can address it then if she shows.

## 2016-01-07 NOTE — Telephone Encounter (Signed)
Calling about Plan of care for patient

## 2016-01-08 ENCOUNTER — Telehealth: Payer: Self-pay | Admitting: Physician Assistant

## 2016-01-08 DIAGNOSIS — I2510 Atherosclerotic heart disease of native coronary artery without angina pectoris: Secondary | ICD-10-CM | POA: Diagnosis not present

## 2016-01-08 DIAGNOSIS — Z8701 Personal history of pneumonia (recurrent): Secondary | ICD-10-CM | POA: Diagnosis not present

## 2016-01-08 DIAGNOSIS — N183 Chronic kidney disease, stage 3 (moderate): Secondary | ICD-10-CM | POA: Diagnosis not present

## 2016-01-08 DIAGNOSIS — I4891 Unspecified atrial fibrillation: Secondary | ICD-10-CM | POA: Diagnosis not present

## 2016-01-08 DIAGNOSIS — I130 Hypertensive heart and chronic kidney disease with heart failure and stage 1 through stage 4 chronic kidney disease, or unspecified chronic kidney disease: Secondary | ICD-10-CM | POA: Diagnosis not present

## 2016-01-08 DIAGNOSIS — I5033 Acute on chronic diastolic (congestive) heart failure: Secondary | ICD-10-CM | POA: Diagnosis not present

## 2016-01-08 NOTE — Telephone Encounter (Signed)
Pt was in hospital and was on Eliquis-was given rx for Eliquis, however insurance is delaying for pre auth-what to do with out med for now? pls call [phone] or [phone]

## 2016-01-08 NOTE — Telephone Encounter (Signed)
Medication samples have been provided to the patient.  Drug name: eliquis 2.5mg   Qty: 3 boxes  LOT: [id number]  Exp.Date: 04/2017  Samples left at front desk for patient pick-up. Patient notified.  Lindell Sparlkins, Jenna M 4:27 PM 01/08/2016   * patient states she was discharged home on this medication but has not gotten the Rx from the pharmacy. She has not yet started on eliquis. Informed her that I would send a message to [place] to begin the PA process * provided free 30 day trial card in w/samples * patient has appt w/S. Alben SpittleWeaver, [place] on 6/19

## 2016-01-09 ENCOUNTER — Ambulatory Visit (INDEPENDENT_AMBULATORY_CARE_PROVIDER_SITE_OTHER): Payer: Medicare Other | Admitting: Pulmonary Disease

## 2016-01-09 ENCOUNTER — Telehealth: Payer: Self-pay

## 2016-01-09 ENCOUNTER — Encounter: Payer: Self-pay | Admitting: Pulmonary Disease

## 2016-01-09 ENCOUNTER — Other Ambulatory Visit: Payer: Self-pay

## 2016-01-09 VITALS — BP 138/65 | HR 62 | Temp 97.5°F | Resp 18 | Ht 63.0 in | Wt 165.6 lb

## 2016-01-09 DIAGNOSIS — I129 Hypertensive chronic kidney disease with stage 1 through stage 4 chronic kidney disease, or unspecified chronic kidney disease: Secondary | ICD-10-CM | POA: Diagnosis not present

## 2016-01-09 DIAGNOSIS — N183 Chronic kidney disease, stage 3 unspecified: Secondary | ICD-10-CM

## 2016-01-09 DIAGNOSIS — J13 Pneumonia due to Streptococcus pneumoniae: Secondary | ICD-10-CM

## 2016-01-09 DIAGNOSIS — Z7982 Long term (current) use of aspirin: Secondary | ICD-10-CM | POA: Diagnosis not present

## 2016-01-09 DIAGNOSIS — I10 Essential (primary) hypertension: Secondary | ICD-10-CM

## 2016-01-09 DIAGNOSIS — I4891 Unspecified atrial fibrillation: Secondary | ICD-10-CM | POA: Diagnosis not present

## 2016-01-09 NOTE — Telephone Encounter (Signed)
Dr Isabella BowensKrall saw pt and believes she will benefit from OT. I will sign form

## 2016-01-09 NOTE — Assessment & Plan Note (Signed)
Did not get BMP today because patient left without visiting lab. Encouraged her to make an appointment to see her nephrologist later this month.

## 2016-01-09 NOTE — Patient Outreach (Signed)
This RNCM was unsuccessful in making contact with patient to schedule home visit for next week. HIPPA compliant message left for patient with this RNCM's contact information.  Plan: Make another attempt to contact patient next week.

## 2016-01-09 NOTE — Assessment & Plan Note (Signed)
Blood pressure at goal today at 138/65  Continue metoprolol tartrate 25mg  BID and diltiazem 240mg  daily. May need to titrate her medications to just one nodal blocking agent.

## 2016-01-09 NOTE — Patient Instructions (Signed)
Please follow up with pulmonology Please follow up with cardiology Please make an appointment to follow up with nephrology

## 2016-01-09 NOTE — Assessment & Plan Note (Addendum)
Appears to be improving slowly. Still has decreased breath sounds at right base.   Has follow up with Pulm later this month Will likely need to have a repeat imaging in 4-6 weeks.

## 2016-01-09 NOTE — Telephone Encounter (Signed)
Called [facility] gave verbal for OT for eval and treat

## 2016-01-09 NOTE — Assessment & Plan Note (Signed)
HR 62 on dilt and metop She is picking up her anticoagulation later today Has follow up with cardiology later this month

## 2016-01-09 NOTE — Progress Notes (Signed)
Subjective:    Patient ID: [name], female    DOB: 08/12/1931, 82 y.o.   MRN: [id number]  HPI [name] is an an 82 year old woman with history of T2DM, gastritis, CKD3, FSGN s/p renal transplant in 2003, CAD, chronic diastolic dysfunction, gout, atrial fibrillation presenting for follow up of hospitalization for pneumonia.  Hospitalized 5/18-6/1 for pneumococcal pneumonia.   Body feels week. Feels okay at rest. Feels like she is getting better overall since she left he hospital. Has physical therapy and RN coming to house currently. Working well with PT.  Picking up anticoagulation today.  Review of Systems Constitutional: no fevers/chills Ears, nose, mouth, throat, and face: some cough productive of white sputum Respiratory: +shortness of breath when oxygen off Cardiovascular: no chest pain, no palpitations Gastrointestinal: no nausea/vomiting, no abdominal pain, no diarrhea Genitourinary: no dysuria, no hematuria  Past Medical History  Diagnosis Date  . S/P kidney transplant     a. due to FSGN, follows with Dr. Vivia BirminghamMattingly-->Transplant in 2003. Was on HD prior to that.  . Coronary artery disease     a. 01/2001 Cath/PCI: LAD 50p, 7538m, D1 50-60, RCA 3844m (3.0x13 BX Velocity BMS).  . Hypertensive heart disease   . Cholelithiasis   . Chronic diastolic CHF (congestive heart failure) (HCC)     a. 06/2011 Echo: EF 60-65%, no rwma, Gr1 DD, mild TR.  Marland Kitchen. Gout     unconfirmed by joint aspiration  . CKD (chronic kidney disease), stage III     a. in setting of prior ESRD and cadaveric tx in 2003.  . Candidal esophagitis (HCC)     a. 03/2014.  Marland Kitchen. Gastritis     a. 03/2014  . Type II diabetes mellitus (HCC)   . History of bacteremia     a. 08/2004 Group A Strep bacteremia.    Current Outpatient Prescriptions on File Prior to Visit  Medication Sig Dispense Refill  . allopurinol (ZYLOPRIM) 100 MG tablet Take 100 mg by mouth at bedtime.    Marland Kitchen. amoxicillin-clavulanate (AUGMENTIN)  875-125 MG tablet Take 1 tablet by mouth 2 (two) times daily. 10 tablet 0  . apixaban (ELIQUIS) 2.5 MG TABS tablet Take 1 tablet (2.5 mg total) by mouth 2 (two) times daily. 60 tablet 11  . aspirin EC 81 MG tablet Take 81 mg by mouth daily.    Marland Kitchen. atorvastatin (LIPITOR) 10 MG tablet Take 1 tablet (10 mg total) by mouth daily at 6 PM. 30 tablet 1  . cinacalcet (SENSIPAR) 90 MG tablet Take 30 mg by mouth daily.     . colchicine 0.6 MG tablet Take 0.6 mg by mouth daily as needed. For gout    . diltiazem (CARDIZEM CD) 240 MG 24 hr capsule Take 1 capsule (240 mg total) by mouth daily. 30 capsule 1  . famotidine (PEPCID) 20 MG tablet Take 20 mg by mouth 2 (two) times daily.    . furosemide (LASIX) 20 MG tablet Take 3 tablets (60 mg total) by mouth 2 (two) times daily. 60 tablet 1  . guaiFENesin (MUCINEX) 600 MG 12 hr tablet Take 2 tablets (1,200 mg total) by mouth 2 (two) times daily. 20 tablet 1  . metoprolol tartrate (LOPRESSOR) 25 MG tablet Take 1 tablet (25 mg total) by mouth 2 (two) times daily. 60 tablet 1  . mycophenolate (CELLCEPT) 250 MG capsule Take 500 mg by mouth 2 (two) times daily.    . pantoprazole (PROTONIX) 20 MG tablet Take 1 tablet (20  mg total) by mouth daily. 30 tablet 0  . senna-docusate (SENOKOT-S) 8.6-50 MG per tablet Take 1 tablet by mouth daily as needed for mild constipation. 30 tablet 3  . tacrolimus (PROGRAF) 0.5 MG capsule 1 mg in the morning and 1 mg at bedtime 120 capsule 0   No current facility-administered medications on file prior to visit.      Objective:   Physical Exam Blood pressure 138/65, pulse 62, temperature 97.5 F (36.4 C), temperature source Oral, resp. rate 18, height  (1.6 m), weight 165 lb 9.6 oz (75.116 kg), SpO2 99 %. General Apperance: NAD HEENT: Normocephalic, atraumatic, anicteric sclera Neck: Supple, trachea midline Lungs: Decreased sounds on R base. Clear to auscultation otherwise. Breathing comfortably Heart: Regular rate and rhythm, no  murmur/rub/gallop Abdomen: Soft, nontender, nondistended, no rebound/guarding Extremities: Warm and well perfused, no edema Skin: No rashes or lesions Neurologic: Alert and interactive. No gross deficits.    Assessment & Plan:  Please refer to problem based charting.

## 2016-01-12 ENCOUNTER — Encounter: Payer: Self-pay | Admitting: Adult Health

## 2016-01-12 ENCOUNTER — Ambulatory Visit (INDEPENDENT_AMBULATORY_CARE_PROVIDER_SITE_OTHER): Payer: Medicare Other | Admitting: Adult Health

## 2016-01-12 ENCOUNTER — Ambulatory Visit (INDEPENDENT_AMBULATORY_CARE_PROVIDER_SITE_OTHER)
Admission: RE | Admit: 2016-01-12 | Discharge: 2016-01-12 | Disposition: A | Discharge status: Home / Self Care | Payer: Medicare Other | Source: Ambulatory Visit | Attending: Adult Health | Admitting: Adult Health

## 2016-01-12 ENCOUNTER — Telehealth: Payer: Self-pay

## 2016-01-12 VITALS — BP 114/68 | HR 54 | Temp 97.5°F | Ht 63.0 in | Wt 157.0 lb

## 2016-01-12 DIAGNOSIS — J90 Pleural effusion, not elsewhere classified: Secondary | ICD-10-CM

## 2016-01-12 DIAGNOSIS — J9601 Acute respiratory failure with hypoxia: Secondary | ICD-10-CM

## 2016-01-12 DIAGNOSIS — I5033 Acute on chronic diastolic (congestive) heart failure: Secondary | ICD-10-CM

## 2016-01-12 DIAGNOSIS — J13 Pneumonia due to Streptococcus pneumoniae: Secondary | ICD-10-CM | POA: Diagnosis not present

## 2016-01-12 DIAGNOSIS — J189 Pneumonia, unspecified organism: Secondary | ICD-10-CM | POA: Diagnosis not present

## 2016-01-12 NOTE — Progress Notes (Signed)
Internal Medicine Clinic Attending  Case discussed with Dr. Isabella BowensKrall at the time of the visit.  We reviewed the resident's history and exam and pertinent patient test results.  I agree with the assessment, diagnosis, and plan of care documented in the resident's note. Dr Isabella BowensKrall was able to ascertain that home PT/OT would be beneficial so OK for this given.

## 2016-01-12 NOTE — Progress Notes (Signed)
Subjective:    Patient ID: [name], female    DOB: 07/16/1932, 82 y.o.   MRN: [id number]  HPI 82 year old female former smoker seen for pulmonary consult during hospitalization May 2017 with a pneumococcal pneumonia with associated bacteremia and acute hypoxic respiratory failure. She has stage II chronic kidney disease status post renal transplant, diabetes, chronic diastolic congestive heart failure   01/12/2016 post hospital follow-up Patient returns for a post hospital follow-up. Patient was admitted May 2017 for pneumococcal pneumonia with associated bacteremia and acute hypoxic respiratory failure. Hospitalization was compensated by atrial filb with RVR and NSTEMI demand ischemia. She was seen for pulmonary consult during this hospitalization. She was found to have a large right exudative pleural effusion-felt to be parapneumonic. She underwent thoracentesis times 2  with 1460 cc and then 800cc . Cytology neg for malignant cells.  Initial CT chest on May 23 showed a large right pleural effusion with right lower lobe collapse Repeat CT chest on May 31 showed complete atelectasis of the right lower lobe with significant atelectasis of the right middle lobe. The proximal right middle lobe bronchus appears occluded. Small to moderate right pleural effusion is decreased from previous CT scan. She was treated with antibiotics and diuretics were adjusted. .  2 D echo showed w/ EF 55%, mod LA dilation, mod TV regurg, PAP 44.   Since discharge she is feeling better but still gets winded easily . Legs are still swollen .  She was started on o2 at 3l/m at discharge.  She is now on Eliquis .  CXR today shows reaccumulation of Right pleural effusion on right.     Past Medical History  Diagnosis Date  . S/P kidney transplant     a. due to FSGN, follows with Dr. Vivia [name]-->Transplant in 2003. Was on HD prior to that.  . Coronary artery disease     a. 01/2001 Cath/PCI: LAD 50p, 7067m, D1 50-60,  RCA 7843m (3.0x13 BX Velocity BMS).  . Hypertensive heart disease   . Cholelithiasis   . Chronic diastolic CHF (congestive heart failure) (HCC)     a. 06/2011 Echo: EF 60-65%, no rwma, Gr1 DD, mild TR.  Marland Kitchen. Gout     unconfirmed by joint aspiration  . CKD (chronic kidney disease), stage III     a. in setting of prior ESRD and cadaveric tx in 2003.  . Candidal esophagitis (HCC)     a. 03/2014.  Marland Kitchen. Gastritis     a. 03/2014  . Type II diabetes mellitus (HCC)   . History of bacteremia     a. 08/2004 Group A Strep bacteremia.   Current Outpatient Prescriptions on File Prior to Visit  Medication Sig Dispense Refill  . allopurinol (ZYLOPRIM) 100 MG tablet Take 100 mg by mouth at bedtime.    Marland Kitchen. apixaban (ELIQUIS) 2.5 MG TABS tablet Take 1 tablet (2.5 mg total) by mouth 2 (two) times daily. 60 tablet 11  . aspirin EC 81 MG tablet Take 81 mg by mouth daily.    Marland Kitchen. atorvastatin (LIPITOR) 10 MG tablet Take 1 tablet (10 mg total) by mouth daily at 6 PM. 30 tablet 1  . cinacalcet (SENSIPAR) 90 MG tablet Take 30 mg by mouth daily.     . colchicine 0.6 MG tablet Take 0.6 mg by mouth daily as needed. For gout    . diltiazem (CARDIZEM CD) 240 MG 24 hr capsule Take 1 capsule (240 mg total) by mouth daily. 30 capsule 1  . famotidine (  PEPCID) 20 MG tablet Take 20 mg by mouth 2 (two) times daily.    . furosemide (LASIX) 20 MG tablet Take 3 tablets (60 mg total) by mouth 2 (two) times daily. 60 tablet 1  . guaiFENesin (MUCINEX) 600 MG 12 hr tablet Take 2 tablets (1,200 mg total) by mouth 2 (two) times daily. 20 tablet 1  . metoprolol tartrate (LOPRESSOR) 25 MG tablet Take 1 tablet (25 mg total) by mouth 2 (two) times daily. 60 tablet 1  . mycophenolate (CELLCEPT) 250 MG capsule Take 500 mg by mouth 2 (two) times daily.    Marland Kitchen senna-docusate (SENOKOT-S) 8.6-50 MG per tablet Take 1 tablet by mouth daily as needed for mild constipation. 30 tablet 3  . tacrolimus (PROGRAF) 0.5 MG capsule 1 mg in the morning and 1 mg at  bedtime 120 capsule 0  . pantoprazole (PROTONIX) 20 MG tablet Take 1 tablet (20 mg total) by mouth daily. 30 tablet 0   No current facility-administered medications on file prior to visit.     Review of Systems Constitutional:   No  weight loss, night sweats,  Fevers, chills,  +fatigue, or  lassitude.  HEENT:   No headaches,  Difficulty swallowing,  Tooth/dental problems, or  Sore throat,                No sneezing, itching, ear ache, nasal congestion, post nasal drip,   CV:  No chest pain,  Orthopnea, PND, swelling in lower extremities, anasarca, dizziness, palpitations, syncope.   GI  No heartburn, indigestion, abdominal pain, nausea, vomiting, diarrhea, change in bowel habits, loss of appetite, bloody stools.   Resp:  No chest wall deformity  Skin: no rash or lesions.  GU: no dysuria, change in color of urine, no urgency or frequency.  No flank pain, no hematuria   MS:  No joint pain or swelling.  No decreased range of motion.  No back pain.  Psych:  No change in mood or affect. No depression or anxiety.  No memory loss.         Objective:   Physical Exam  Filed Vitals:   01/12/16 1525  BP: 114/68  Pulse: 54  Temp: 97.5 F (36.4 C)  TempSrc: Oral  Height:  (1.6 m)  Weight: 157 lb (71.215 kg)  SpO2: 99%   GEN: A/Ox3; pleasant , NAD, chronically ill appearing on o2 in wc   HEENT:  [place]/AT,  EACs-clear, TMs-wnl, NOSE-clear, THROAT-clear, no lesions, no postnasal drip or exudate noted.   NECK:  Supple w/ fair ROM; no JVD; normal carotid impulses w/o bruits; no thyromegaly or nodules palpated; no lymphadenopathy.  RESP  Decreased BS on right , .no accessory muscle use, no dullness to percussion  CARD:  RRR, no m/r/g  , 1+ peripheral edema, pulses intact, no cyanosis or clubbing.  GI:   Soft & nt; nml bowel sounds; no organomegaly or masses detected.  Musco: Warm bil, no deformities or joint swelling noted.   Neuro: alert, no focal deficits noted.     Skin: Warm, no lesions or rashes  CXR 01/12/2016  COPD changes with increased RIGHT pleural effusion and basilar atelectasis since prior study. Reviewed independently  Tammy Parrett NP-C  [facility]  01/12/2016

## 2016-01-12 NOTE — Telephone Encounter (Signed)
[facility]ell Mouldinguth requesting VO now that patient has been seen  [facility] and then 2 visits/week for 2 weeks  Is this OK

## 2016-01-12 NOTE — Assessment & Plan Note (Signed)
Pneumococcal pneumonia with associated bacteremia slowly improving clinically with antibiotics. Unfortunately, right pleural effusion has reaccumulated. Previous CT chest did show right middle lobe and right lower lobe atelectasis and collapse. Will need to follow cxr and consider repeat CT chest +/- FOB if not clearing  .  Case discussed with Dr. Clayborn BignessdeDios

## 2016-01-12 NOTE — Telephone Encounter (Signed)
OK 

## 2016-01-12 NOTE — Assessment & Plan Note (Signed)
Recurrent Right Pleural Effusion -she is s/p 2 Thoracentesis -appeared to exudative /probable parapneumonic +/-decompensated D CHF despite aggressive diuresis .  Will set pt up for therapeutic thoracentesis w/ no more than 1.5L to be removed.  Close follow up with CXR in 1-2 weeks .  Will need to hold Eliquis 3 days prior to procedure per protocol.

## 2016-01-12 NOTE — Patient Instructions (Addendum)
Set up for a thoracentesis on the right -therapeutic .  May go back to :Lasix 80mg  Twice daily  .  Follow up with Kidney doctor this week.  Continue on oxygen 3l/m  Follow up with Dr. Clayborn BignessdeDios in 2 weeks and As needed  With chest xray .  Please contact office for sooner follow up if symptoms do not improve or worsen or seek emergency care

## 2016-01-12 NOTE — Telephone Encounter (Signed)
Please call back [name].

## 2016-01-12 NOTE — Assessment & Plan Note (Signed)
Improved with oxgyen  Cont on O2 at 3l/m  Evaluate need on return .

## 2016-01-12 NOTE — Assessment & Plan Note (Signed)
Continues to have significant peripheral edema despite lasix  She has stage II -III CKD s/p renal transplant. Would like her to see renal this week to discuss Diuretic options that this may be contributing to Effusion and edema as well.

## 2016-01-13 ENCOUNTER — Telehealth: Payer: Self-pay

## 2016-01-13 DIAGNOSIS — N183 Chronic kidney disease, stage 3 (moderate): Secondary | ICD-10-CM | POA: Diagnosis not present

## 2016-01-13 DIAGNOSIS — Z8701 Personal history of pneumonia (recurrent): Secondary | ICD-10-CM | POA: Diagnosis not present

## 2016-01-13 DIAGNOSIS — I2510 Atherosclerotic heart disease of native coronary artery without angina pectoris: Secondary | ICD-10-CM | POA: Diagnosis not present

## 2016-01-13 DIAGNOSIS — I130 Hypertensive heart and chronic kidney disease with heart failure and stage 1 through stage 4 chronic kidney disease, or unspecified chronic kidney disease: Secondary | ICD-10-CM | POA: Diagnosis not present

## 2016-01-13 DIAGNOSIS — I5033 Acute on chronic diastolic (congestive) heart failure: Secondary | ICD-10-CM | POA: Diagnosis not present

## 2016-01-13 DIAGNOSIS — I4891 Unspecified atrial fibrillation: Secondary | ICD-10-CM | POA: Diagnosis not present

## 2016-01-13 NOTE — Telephone Encounter (Signed)
Prior auth obtained for Eliquis. Local pharmacy notified.

## 2016-01-13 NOTE — Telephone Encounter (Signed)
Eliquis 5mg  approved through 08/01/2016. PA- [id number]. Local pharmacy notified.

## 2016-01-14 ENCOUNTER — Other Ambulatory Visit: Payer: Self-pay

## 2016-01-14 ENCOUNTER — Ambulatory Visit (HOSPITAL_COMMUNITY): Payer: Medicare Other

## 2016-01-14 DIAGNOSIS — I5041 Acute combined systolic (congestive) and diastolic (congestive) heart failure: Secondary | ICD-10-CM

## 2016-01-14 NOTE — Patient Outreach (Signed)
[facility] Care Management  01/14/2016  Joy [name] 27-Jun-1932 [id number]      Patient able to afford medication, medications come through mail order from [facility].  Patient was taking several medications that had been discontinued.

## 2016-01-15 DIAGNOSIS — I130 Hypertensive heart and chronic kidney disease with heart failure and stage 1 through stage 4 chronic kidney disease, or unspecified chronic kidney disease: Secondary | ICD-10-CM | POA: Diagnosis not present

## 2016-01-15 DIAGNOSIS — I5033 Acute on chronic diastolic (congestive) heart failure: Secondary | ICD-10-CM | POA: Diagnosis not present

## 2016-01-15 DIAGNOSIS — I2510 Atherosclerotic heart disease of native coronary artery without angina pectoris: Secondary | ICD-10-CM | POA: Diagnosis not present

## 2016-01-15 DIAGNOSIS — Z8701 Personal history of pneumonia (recurrent): Secondary | ICD-10-CM | POA: Diagnosis not present

## 2016-01-15 DIAGNOSIS — I4891 Unspecified atrial fibrillation: Secondary | ICD-10-CM | POA: Diagnosis not present

## 2016-01-15 DIAGNOSIS — N183 Chronic kidney disease, stage 3 (moderate): Secondary | ICD-10-CM | POA: Diagnosis not present

## 2016-01-16 ENCOUNTER — Ambulatory Visit (HOSPITAL_COMMUNITY)
Admission: RE | Admit: 2016-01-16 | Discharge: 2016-01-16 | Disposition: A | Discharge status: Home / Self Care | Payer: Medicare Other | Source: Ambulatory Visit | Attending: Adult Health | Admitting: Adult Health

## 2016-01-16 ENCOUNTER — Ambulatory Visit (HOSPITAL_COMMUNITY)
Admission: RE | Admit: 2016-01-16 | Discharge: 2016-01-16 | Disposition: A | Discharge status: Home / Self Care | Payer: Medicare Other | Source: Ambulatory Visit | Attending: Pulmonary Disease | Admitting: Pulmonary Disease

## 2016-01-16 DIAGNOSIS — J90 Pleural effusion, not elsewhere classified: Secondary | ICD-10-CM | POA: Diagnosis not present

## 2016-01-16 DIAGNOSIS — Z9889 Other specified postprocedural states: Secondary | ICD-10-CM

## 2016-01-16 DIAGNOSIS — R091 Pleurisy: Secondary | ICD-10-CM | POA: Diagnosis not present

## 2016-01-16 LAB — BODY FLUID CELL COUNT WITH DIFFERENTIAL
Lymphs, Fluid: 95 %
MONOCYTE-MACROPHAGE-SEROUS FLUID: 2 % — AB (ref 50–90)
Neutrophil Count, Fluid: 3 % (ref 0–25)
WBC FLUID: 230 uL (ref 0–1000)

## 2016-01-16 LAB — GRAM STAIN

## 2016-01-16 LAB — LACTATE DEHYDROGENASE, PLEURAL OR PERITONEAL FLUID: LD, Fluid: 80 U/L — ABNORMAL HIGH (ref 3–23)

## 2016-01-16 LAB — PROTEIN, BODY FLUID

## 2016-01-16 NOTE — Procedures (Signed)
Thoracentesis Procedure Note  Pre-operative Diagnosis: recurrent right pleural effusion  Post-operative Diagnosis: same  Indications: recurrent right pleural effusion  Procedure Details  Consent: Informed consent was obtained. Risks of the procedure were discussed including: infection, bleeding, pain, pneumothorax.  Under sterile conditions the patient was positioned. Betadine solution and sterile drapes were utilized.  2% buffered lidocaine was used to anesthetize the 7th rib space. Fluid was obtained without any difficulties and minimal blood loss.  A dressing was applied to the wound and wound care instructions were provided.   Findings 1200 ml of clear, amber yellow  pleural fluid was obtained. A sample was sent to Pathology for cytology,chem, and cell counts, as well as for infection analysis.  Complications:  None; patient tolerated the procedure well. Procedure stopped due to chest pain        Condition: stable  Plan A follow up chest x-ray was ordered. Bed Rest for 1 hours. Tylenol 650 mg. for pain.  Attending Attestation: I was present and scrubbed for the entire procedure.   Cyril Mourningakesh Romney Compean MD. Tonny BollmanFCCP. [facility] Pager [phone] If no response call [phone]   01/16/2016

## 2016-01-18 NOTE — Progress Notes (Addendum)
Cardiology Office Note:    Date:  01/19/2016   ID:  [name], DOB 1931-08-08, MRN [id number]  PCP:  Joy Kronerruong, Diana, MD  Cardiologist:  Dr. K. Italyhad [name]   Electrophysiologist:  N/a Pulmonologist: Dr. Christene Slatese [name] Nephrologist:  Dr. Briant [name]  Referring MD: Burns SpainButcher, Joy A, MD   Chief Complaint  Patient presents with  . Hospitalization Follow-up    AF with RVR, NSTEMI, a/c diastolic HF in setting of pneumionia    History of Present Illness:     [name] is a 82 y.o. 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  Cardiac cath was done in 2002. Mid RCA 85% lesion was treated with a bare metal stent. She had moderate nonobstructive disease in LAD and diagonal treated medically. She's been seen by different cardiologists during past admissions but has never followed up in the office.   Most recently admitted 5/18-6/1 with community-acquired pneumococcal pneumonia complicated by AF with RVR and non-STEMI.  Echo demonstrated normal LV function with an EF of 55-60%. Troponin peaked at 1.26. On rate control therapy, she converted to NSR.  Pneumonia was complicated by right pleural effusion. She required thoracentesis x 2. This was likely exudative. Cytology was neg for malignancy.  Initially, cardiac catheterization was considered. Nephrology also followed the patient. Her creatinine increased but did improve prior to DC.  Given her prior renal transplant, it was felt that cardiac catheterization should be avoided. It was thought that her elevated troponin was likely related to demand ischemia.  Inpatient nuclear stress test was arranged.  This was low risk and neg for ischemia.  Med Rx was recommended.  CHADS2-VASc=7 (age > 1275, female, HTN, DM, CHF, CAD).  Decision was made to start chronic anticoagulation with Eliquis.    She was seen in FU by Pulmonology Rubye Oaks(Joy Parrett, NP) on 01/12/16.   Patient had re-accumulation of R pleural effusion.  She was set up for repeat thoracentesis which was done 6/16.    She returns for FU.  She is here today with her daughter and grandchildren. Since undergoing repeat thoracentesis last week, she is feeling much better. Breathing is improved. However, she remains short of breath with minimal activity. She sleeps on 3 pillows chronically without significant change. She denies PND. She does note some left ankle edema. This is somewhat better in the mornings. She noted this after leaving the hospital. She does have significant knee arthritis on that side and had undergone injections prior to getting admitted to the hospital. She denies chest discomfort. She denies syncope. She denies significant cough.   Past Medical History  Diagnosis Date  . S/P kidney transplant     a. due to FSGN, follows with Dr. Vivia [name]-->Transplant in 2003. Was on HD prior to that.  . Coronary artery disease     a. 01/2001 Cath/PCI: LAD 50p, 8644m, D1 50-60, RCA 3424m (3.0x13 BX Velocity BMS).// b. NSTEMI 5/17 (demand ischemia) in setting of AF with RVR, pneumonia, CKD, a/c HF >> Myoview 12/26/15: prob inf infarct, no ischemia, EF 52%, Low Risk >> med Rx  . Hypertensive heart disease   . Cholelithiasis   . Chronic diastolic CHF (congestive heart failure) (HCC)     a. 06/2011 Echo: EF 60-65%, no rwma, Gr1 DD, mild TR. // b. Echo 12/19/15: Severe LVH, EF 55-60%, normal wall motion, mild MR, moderate LAE, moderate TR, PASP 44  mmHg  . Gout     unconfirmed by joint aspiration  . CKD (chronic kidney disease), stage III     a. in setting of prior ESRD and cadaveric tx in 12-09-2001.  . Candidal esophagitis (HCC)     a. 03/2014.  Marland Kitchen Gastritis     a. 03/2014  . Type II diabetes mellitus (HCC)   . History of bacteremia     a. 08/2004 Group A Strep bacteremia.    Past Surgical History  Procedure Laterality Date  . Kidney transplant  12/09/01  . Abdominal hysterectomy    .  Esophagogastroduodenoscopy N/A 03/20/2014    Procedure: ESOPHAGOGASTRODUODENOSCOPY (EGD);  Surgeon: Joy Belfast, MD;  [facility] ENDOSCOPY;  Service: Endoscopy;  Laterality: N/A;    Current Medications: Outpatient Prescriptions Prior to Visit  Medication Sig Dispense Refill  . allopurinol (ZYLOPRIM) 100 MG tablet Take 100 mg by mouth at bedtime.    Marland Kitchen aspirin EC 81 MG tablet Take 81 mg by mouth daily.    Marland Kitchen atorvastatin (LIPITOR) 10 MG tablet Take 1 tablet (10 mg total) by mouth daily at 6 PM. 30 tablet 1  . cinacalcet (SENSIPAR) 90 MG tablet Take 30 mg by mouth daily.     . colchicine 0.6 MG tablet Take 0.6 mg by mouth daily as needed (FOR GOUT). Reported on 01/14/2016    . diltiazem (CARDIZEM CD) 240 MG 24 hr capsule Take 1 capsule (240 mg total) by mouth daily. 30 capsule 1  . famotidine (PEPCID) 20 MG tablet Take 20 mg by mouth 2 (two) times daily.    . metoprolol tartrate (LOPRESSOR) 25 MG tablet Take 1 tablet (25 mg total) by mouth 2 (two) times daily. 60 tablet 1  . mycophenolate (CELLCEPT) 250 MG capsule Take 500 mg by mouth 2 (two) times daily.    Marland Kitchen senna-docusate (SENOKOT-S) 8.6-50 MG per tablet Take 1 tablet by mouth daily as needed for mild constipation. 30 tablet 3  . tacrolimus (PROGRAF) 0.5 MG capsule 1 mg in the morning and 1 mg at bedtime 120 capsule 0  . apixaban (ELIQUIS) 2.5 MG TABS tablet Take 1 tablet (2.5 mg total) by mouth 2 (two) times daily. (Patient not taking: Reported on 01/14/2016) 60 tablet 11  . furosemide (LASIX) 20 MG tablet Take 3 tablets (60 mg total) by mouth 2 (two) times daily. (Patient not taking: Reported on 01/19/2016) 60 tablet 1  . guaiFENesin (MUCINEX) 600 MG 12 hr tablet Take 2 tablets (1,200 mg total) by mouth 2 (two) times daily. (Patient not taking: Reported on 01/19/2016) 20 tablet 1  . pantoprazole (PROTONIX) 20 MG tablet Take 1 tablet (20 mg total) by mouth daily. 30 tablet 0   No facility-administered medications prior to visit.       Allergies:   Sulfa drugs cross reactors   Social History   Social History  . Marital Status: Divorced    Spouse Name: N/A  . Number of Children: N/A  . Years of Education: N/A   Social History Main Topics  . Smoking status: Former Smoker    Quit date: 04/19/1969  . Smokeless tobacco: Never Used  . Alcohol Use: No  . Drug Use: No  . Sexual Activity: Not Currently   Other Topics Concern  . None   Social History Narrative   Patient's husband died in 12-09-08.  Retired - previously delivered Rx medications.  Lives in [place] with grandchild.  Does not routinely exercise.     Family History:  The  patient's family history includes CAD in her brother and father.   ROS:   Please see the history of present illness.    Review of Systems  Cardiovascular: Positive for dyspnea on exertion and leg swelling.  Respiratory: Positive for shortness of breath.   Hematologic/Lymphatic: Bruises/bleeds easily.  Gastrointestinal: Positive for nausea.   All other systems reviewed and are negative.   Physical Exam:    VS:  BP 120/52 mmHg  Pulse 66  Ht  (1.6 m)  Wt 161 lb 12.8 oz (73.392 kg)  BMI 28.67 kg/m2  SpO2 97%   Physical Exam  Constitutional: She appears well-developed and well-nourished.  HENT:  Head: Normocephalic and atraumatic.  Neck: Normal range of motion.  No JVD at 90  Cardiovascular: Normal rate, regular rhythm and normal heart sounds.   No murmur heard. Pulmonary/Chest:  Decreased breath sounds bilaterally with faint crackles at the right base, no wheezing, no rales  Abdominal: Soft. There is no tenderness.  Musculoskeletal:  Trace-1+ L ankle edema  Skin: Skin is warm and dry.  Psychiatric: She has a normal mood and affect.    Wt Readings from Last 3 Encounters:  01/19/16 161 lb 12.8 oz (73.392 kg)  01/12/16 157 lb (71.215 kg)  01/09/16 165 lb 9.6 oz (75.116 kg)      Studies/Labs Reviewed:     EKG:  EKG is  ordered today.  The ekg ordered today  demonstrates NSR, HR 65, LBBB, first-degree AV block (PR 218 ms)  Recent Labs: 12/18/2015: B Natriuretic Peptide 1232.3*; Magnesium 2.0; TSH 2.077 01/01/2016: ALT 12*; BUN 36*; Creatinine, Ser 1.56*; Hemoglobin 11.1*; Platelets 296; Potassium 5.2*; Sodium 137   Recent Lipid Panel    Component Value Date/Time   CHOL 101 12/19/2015 0242   TRIG 83 12/19/2015 0242   HDL 34* 12/19/2015 0242   CHOLHDL 3.0 12/19/2015 0242   VLDL 17 12/19/2015 0242   LDLCALC 50 12/19/2015 0242    Additional studies/ records that were reviewed today include:   Myoview 12/26/15 IMPRESSION: 1. Probable inferior wall infarct.  No evidence of ischemia. 2. Mild inferior wall and septal hypokinesis. 3. Left ventricular ejection fraction 52% 4. Low-risk stress test findings*.  Echo 12/19/15 - Left ventricle: The cavity size was normal. Wall thickness was   increased in a pattern of severe LVH. Systolic function was   normal. The estimated ejection fraction was in the range of 55%   to 60%. Wall motion was normal; there were no regional wall   motion abnormalities. - Mitral valve: There was mild regurgitation. - Left atrium: The atrium was moderately dilated. - Tricuspid valve: There was moderate regurgitation. - Pulmonary arteries: Systolic pressure was moderately increased.   PA peak pressure: 44 mm Hg (S). Impressions:  Normal LV systolic function; severe LVH; moderate LAE; mild MR;  moderate TR; moderately elevated pulmonary pressure.   ASSESSMENT:     1. PAF (paroxysmal atrial fibrillation) (HCC)   2. Chronic diastolic (congestive) heart failure (HCC)   3. CAD S/P remote RCA PCI   4. Essential hypertension   5. CKD (chronic kidney disease) stage 3, GFR 30-59 ml/min   6. Pleural effusion, bilateral   7. Edema of left lower extremity     PLAN:     In order of problems listed above:  1. PAF - Recent admission with pneumococcal pneumonia complicated by pleural effusion, non-STEMI felt to be  secondary to demand ischemia, AF with RVR and acute on chronic diastolic CHF.CHADS2-VASc= 7. She is  now on Eliquis for anticoagulation.  I will obtain follow-up BMET today.  2. Chronic diastolic CHF - Volume appears stable. Continue current dose of Lasix. Obtain follow-up BMET today. Also obtain BNP. BNP is significantly elevated, consider adjusting Lasix further.  3. CAD - As noted, recent admission with elevated troponin levels thought to be related to demand ischemia. Inpatient Myoview was low risk. Continue medical therapy with aspirin, atorvastatin, beta blocker.  4. HTN - Blood pressure controlled.  5. CKD - She is status post renal transplant. She is followed by Dr. Briant Cedar.  6. Pleural effusion - She has had recurrent right pleural effusion status post thoracentesis. She has had a total of 3 thoracenteses. Most recent was done 6/16. This is thought to be parapneumonic. She likely has an element of diastolic heart failure contributing. Fu with Pulmonology as planned.   7. Edema - He L leg edema is likely multifactorial and related to hypoalbuminemia, diltiazem and knee DJD.  She was on Heparin in the hospital and transitioned to Eliquis.  She is on the appropriate dose with age > 80 and Creatinine > 1.5.  If Creatinine is < 1.5 today, I will arrange a LE venous duplex to rule out DVT.     Medication Adjustments/Labs and Tests Ordered: Current medicines are reviewed at length with the patient today.  Concerns regarding medicines are outlined above.  Medication changes, Labs and Tests ordered today are outlined in the Patient Instructions noted below. Patient Instructions  Medication Instructions:  Your physician recommends that you continue on your current medications as directed. Please refer to the Current Medication list given to you today. Labwork: TODAY AT [facility] ; BMET, BNP ORDERED BY Jacia Sickman, PAC ; Testing/Procedures: NONE Follow-Up: DR. HILTY IN 1 MONTH OR PA/NP IN THE  [facility]TH [facility] OFFICE Any Other Special Instructions Will Be Listed Below (If Applicable). If you need a refill on your cardiac medications before your next appointment, please call your pharmacy.   Signed, Tereso Newcomer, PA-C  01/19/2016 5:47 PM    [facility] [address] [place], [place], [place]  [address] Phone: [phone]; Fax: [phone]

## 2016-01-19 ENCOUNTER — Ambulatory Visit (INDEPENDENT_AMBULATORY_CARE_PROVIDER_SITE_OTHER): Payer: Medicare Other | Admitting: Physician Assistant

## 2016-01-19 ENCOUNTER — Encounter: Payer: Self-pay | Admitting: Physician Assistant

## 2016-01-19 VITALS — BP 120/52 | HR 66 | Ht 63.0 in | Wt 161.8 lb

## 2016-01-19 DIAGNOSIS — Z9861 Coronary angioplasty status: Secondary | ICD-10-CM

## 2016-01-19 DIAGNOSIS — R600 Localized edema: Secondary | ICD-10-CM

## 2016-01-19 DIAGNOSIS — I4891 Unspecified atrial fibrillation: Secondary | ICD-10-CM | POA: Diagnosis not present

## 2016-01-19 DIAGNOSIS — N183 Chronic kidney disease, stage 3 unspecified: Secondary | ICD-10-CM

## 2016-01-19 DIAGNOSIS — Z79899 Other long term (current) drug therapy: Secondary | ICD-10-CM | POA: Diagnosis not present

## 2016-01-19 DIAGNOSIS — Z940 Kidney transplant status: Secondary | ICD-10-CM | POA: Diagnosis not present

## 2016-01-19 DIAGNOSIS — Z8701 Personal history of pneumonia (recurrent): Secondary | ICD-10-CM | POA: Diagnosis not present

## 2016-01-19 DIAGNOSIS — I5032 Chronic diastolic (congestive) heart failure: Secondary | ICD-10-CM

## 2016-01-19 DIAGNOSIS — I2510 Atherosclerotic heart disease of native coronary artery without angina pectoris: Secondary | ICD-10-CM

## 2016-01-19 DIAGNOSIS — I480 Paroxysmal atrial fibrillation: Secondary | ICD-10-CM

## 2016-01-19 DIAGNOSIS — I10 Essential (primary) hypertension: Secondary | ICD-10-CM | POA: Diagnosis not present

## 2016-01-19 DIAGNOSIS — J90 Pleural effusion, not elsewhere classified: Secondary | ICD-10-CM

## 2016-01-19 DIAGNOSIS — E212 Other hyperparathyroidism: Secondary | ICD-10-CM | POA: Diagnosis not present

## 2016-01-19 DIAGNOSIS — M109 Gout, unspecified: Secondary | ICD-10-CM | POA: Diagnosis not present

## 2016-01-19 DIAGNOSIS — I130 Hypertensive heart and chronic kidney disease with heart failure and stage 1 through stage 4 chronic kidney disease, or unspecified chronic kidney disease: Secondary | ICD-10-CM | POA: Diagnosis not present

## 2016-01-19 DIAGNOSIS — I5033 Acute on chronic diastolic (congestive) heart failure: Secondary | ICD-10-CM | POA: Diagnosis not present

## 2016-01-19 NOTE — Patient Instructions (Addendum)
Medication Instructions:  Your physician recommends that you continue on your current medications as directed. Please refer to the Current Medication list given to you today. Labwork: TODAY AT [facility] ; BMET, BNP ORDERED BY SCOTT WEAVER, PAC ; Testing/Procedures: NONE Follow-Up: DR. HILTY IN 1 MONTH OR PA/NP IN THE [facility] OFFICE Any Other Special Instructions Will Be Listed Below (If Applicable). If you need a refill on your cardiac medications before your next appointment, please call your pharmacy.

## 2016-01-20 DIAGNOSIS — I4891 Unspecified atrial fibrillation: Secondary | ICD-10-CM | POA: Diagnosis not present

## 2016-01-20 DIAGNOSIS — N183 Chronic kidney disease, stage 3 (moderate): Secondary | ICD-10-CM | POA: Diagnosis not present

## 2016-01-20 DIAGNOSIS — I130 Hypertensive heart and chronic kidney disease with heart failure and stage 1 through stage 4 chronic kidney disease, or unspecified chronic kidney disease: Secondary | ICD-10-CM | POA: Diagnosis not present

## 2016-01-20 DIAGNOSIS — I5033 Acute on chronic diastolic (congestive) heart failure: Secondary | ICD-10-CM | POA: Diagnosis not present

## 2016-01-20 DIAGNOSIS — Z8701 Personal history of pneumonia (recurrent): Secondary | ICD-10-CM | POA: Diagnosis not present

## 2016-01-20 DIAGNOSIS — I2510 Atherosclerotic heart disease of native coronary artery without angina pectoris: Secondary | ICD-10-CM | POA: Diagnosis not present

## 2016-01-20 LAB — BASIC METABOLIC PANEL
BUN/Creatinine Ratio: 18 (ref 12–28)
BUN: 22 mg/dL (ref 8–27)
CALCIUM: 10.1 mg/dL (ref 8.7–10.3)
CHLORIDE: 96 mmol/L (ref 96–106)
CO2: 25 mmol/L (ref 18–29)
CREATININE: 1.21 mg/dL — AB (ref 0.57–1.00)
GFR calc non Af Amer: 42 mL/min/{1.73_m2} — ABNORMAL LOW (ref >59)
GFR, EST AFRICAN AMERICAN: 48 mL/min/{1.73_m2} — AB (ref >59)
Glucose: 115 mg/dL — ABNORMAL HIGH (ref 65–99)
Potassium: 4.1 mmol/L (ref 3.5–5.2)
Sodium: 138 mmol/L (ref 134–144)

## 2016-01-20 LAB — BRAIN NATRIURETIC PEPTIDE: BNP: 384.1 pg/mL — AB (ref 0.0–100.0)

## 2016-01-21 ENCOUNTER — Other Ambulatory Visit: Payer: Self-pay | Admitting: Pharmacist

## 2016-01-21 ENCOUNTER — Telehealth: Payer: Self-pay

## 2016-01-21 ENCOUNTER — Telehealth: Payer: Self-pay | Admitting: Internal Medicine

## 2016-01-21 ENCOUNTER — Other Ambulatory Visit: Payer: Self-pay

## 2016-01-21 DIAGNOSIS — R600 Localized edema: Secondary | ICD-10-CM

## 2016-01-21 LAB — CULTURE, BODY FLUID-BOTTLE

## 2016-01-21 LAB — CULTURE, BODY FLUID W GRAM STAIN -BOTTLE: Culture: NO GROWTH

## 2016-01-21 NOTE — Telephone Encounter (Signed)
[name] is calling in to clarify the pt's current med list. Please f/u with her.   Thanks

## 2016-01-21 NOTE — Telephone Encounter (Signed)
Follow-up     [name] is calling back concerning looking into the patient's medications, when the pt was discharged from the hospital the pt was to start on Diltiazem but as of now has not started the medication [name] was thinking maybe because of the cost that seems to be a issue.   Pt c/o medication issue:  1. Name of Medication: amlodipine  2. How are you currently taking this medication (dosage and times per day)? Not given  3. Are you having a reaction (difficulty breathing--STAT)? no  4. What is your medication issue? The pt was suppose to stop the amlodipine and start the Diltiazem

## 2016-01-21 NOTE — Telephone Encounter (Signed)
[name] is a 82 y.o. female who was contacted via telephone for monitoring of apixaban (Eliquis) therapy.    ASSESSMENT Indication(s): atrial fibrillation Duration: indefinite  Labs:    Component Value Date/Time   AST 19 01/01/2016 0345   ALT 12* 01/01/2016 0345   NA 138 01/19/2016 1155   NA 137 01/01/2016 0345   K 4.1 01/19/2016 1155   CL 96 01/19/2016 1155   CO2 25 01/19/2016 1155   GLUCOSE 115* 01/19/2016 1155   GLUCOSE 102* 01/01/2016 0345   HGBA1C 6.9* 12/18/2015 1522   BUN 22 01/19/2016 1155   BUN 36* 01/01/2016 0345   CREATININE 1.21* 01/19/2016 1155   CALCIUM 10.1 01/19/2016 1155   GFRNONAA 42* 01/19/2016 1155   GFRAA 48* 01/19/2016 1155   WBC 8.0 01/01/2016 0345   HGB 11.1* 01/01/2016 0345   HCT 36.4 01/01/2016 0345   PLT 296 01/01/2016 0345    apixaban (Eliquis) Dose: 2.5 mg BID  Safety: Patient has not had recent bleeding/thromboembolic events. Patient reports no recent signs or symptoms of bleeding, no signs of symptoms of thromboembolism. Medication changes: no.  Adherence: Patient reports no known adherence challenges. Patient does correctly recite the dose. Recently started on this medication (01/14/16) and stated medication cost $45/month and that is a lot for her. Looking into options for her to lower cost to ensure adherence.  Patient Instructions: Patient advised to contact clinic or seek medical attention if signs/symptoms of bleeding or thromboembolism occur. Patient verbalized understanding by repeating back information.  Follow-up No follow up on file  Duane LopeHailey L Danyelle Brookover PharmD Candidate  01/21/2016, 11:07 AM

## 2016-01-21 NOTE — Patient Outreach (Signed)
[facility] Care Management  [facility]   01/21/2016  [name] 1931-09-03 [id number]  Subjective: [name] is a 82 y.o. female referred to pharmacy for medication reconciliation and review. Called and spoke with patient. HIPAA identifiers verified and verbal consent received. Reviewed patient's current medications with patient and updated EPIC medication list accordingly.   Patient reports that she is taking amlodipine 5mg  daily. Reports that she is not taking diltiazem, although listed on her EPIC medication list as taking diltiazem CD 240 mg by mouth daily. Note that per patient's discharge summary in EPIC from 01/01/16, she was to stop amlodipine and start diltiazem at that time.   Note that [name] is taking both aspirin and Eliquis. Note that taking aspirin with Eliquis may increase the patient's risk of bleeding. Patient reports that she has been taking aspirin for a long time. Let [name] know that I will follow up with her Cardiologist to 1) confirm whether she should continue to take aspirin, 2) determine whether she should have stopped amlodipine and 3) to find out whether she should be taking diltiazem.   Patient states that the dose of her Prograf was decreased while she was in the hospital. Reports that she has an upcoming appointment with [facility] in the first week of July when she reports that she will have this evaluated again by her nephrologist.  Objective:   Encounter Medications: Outpatient Encounter Prescriptions as of 01/21/2016  Medication Sig Note  . allopurinol (ZYLOPRIM) 100 MG tablet Take 100 mg by mouth at bedtime.   Marland Kitchen. amLODipine (NORVASC) 10 MG tablet Take 5 mg by mouth daily.   Marland Kitchen. apixaban (ELIQUIS) 2.5 MG TABS tablet Take 2.5 mg by mouth 2 (two) times daily.   Marland Kitchen. aspirin EC 81 MG tablet Take 81 mg by mouth daily.   Marland Kitchen. atorvastatin (LIPITOR) 10 MG tablet Take 1 tablet (10 mg total) by mouth daily at 6 PM.   . cinacalcet  (SENSIPAR) 90 MG tablet Take 30 mg by mouth daily.    . colchicine 0.6 MG tablet Take 0.6 mg by mouth daily as needed (FOR GOUT). Reported on 01/21/2016 01/21/2016: Reports that she was told to take 1 tablet by mouth every hour three times (for 3 tablets total) for gout flare  . famotidine (PEPCID) 20 MG tablet Take 20 mg by mouth 2 (two) times daily.   . furosemide (LASIX) 80 MG tablet Take 80 mg by mouth 2 (two) times daily.   . metoprolol (LOPRESSOR) 50 MG tablet Take 50 mg by mouth 2 (two) times daily.   . mycophenolate (CELLCEPT) 250 MG capsule Take 500 mg by mouth 2 (two) times daily.   . tacrolimus (PROGRAF) 0.5 MG capsule 1 mg in the morning and 1 mg at bedtime   . [DISCONTINUED] metoprolol tartrate (LOPRESSOR) 25 MG tablet Take 1 tablet (25 mg total) by mouth 2 (two) times daily.   Marland Kitchen. diltiazem (CARDIZEM CD) 240 MG 24 hr capsule Take 1 capsule (240 mg total) by mouth daily. (Patient not taking: Reported on 01/21/2016)   . [DISCONTINUED] senna-docusate (SENOKOT-S) 8.6-50 MG per tablet Take 1 tablet by mouth daily as needed for mild constipation.    No facility-administered encounter medications on file as of 01/21/2016.    Assessment:  Drugs sorted by system:  Cardiovascular: amlodipine, Eliquis, aspirin, atorvastatin, furosemide, metoprolol  Gastrointestinal: famotidine  Renal: Sensipar, Cellcept, Prograf  Miscellaneous: allopurinol, colchicine   Duplications in therapy: Eliquis + aspirin  Gaps in therapy: none noted  Medications to avoid in the elderly: none noted  Drug interactions:  . Atorvastatin + colchicine: Colchicine may increase the serum concentration of HMG-CoA Reductase Inhibitors. Note patient reports rare use of colchicine, only for gout flare.  . Eliquis + aspirin: aspirin may increase the risk of bleeding with Eliquis. Cardiologist contacted.  . Sensipar + metoprolol: CYP2D6 Inhibitors, such as Sensipar, may increase the serum concentration of metoprolol.  Both Sensipar and metoprolol are maintenance medications for this patient and her metoprolol dose, along with blood pressure and pulse, are followed by cardiology.   Plan:  1) Will call/message patient's Cardiologist to 1) confirm whether she should continue to take aspirin, 2) determine whether she should have stopped amlodipine and 3) to find out whether she should be taking diltiazem.   2) Following a response from patient's Cardiologist will plan to follow up with patient and then close pharmacy episode.  Duanne Moron, PharmD Clinical Pharmacist [facility] Care Management [phone]

## 2016-01-21 NOTE — Telephone Encounter (Signed)
Informed patient of results and verbal understanding expressed.  Repeat BMET scheduled July 5. Patient understands Eliquis may be adjusted at that time. LLE venous duplex ordered for scheduling. Patient agrees with treatment plan.

## 2016-01-21 NOTE — Patient Outreach (Signed)
Unsuccessful attempt made to contact patient via telephone. HIPPA compliant message left for patient with this RNCM's contact number.   Plan:  Attempt telephone contact later this month.

## 2016-01-21 NOTE — Telephone Encounter (Signed)
-----   Message from Beatrice LecherScott T Weaver, [place] sent at 01/21/2016  8:06 AM EDT ----- Creatinine stable K+ ok BNP improved Continue current dose of Lasix Creatinine is better which may mean that we need to adjust her Eliquis dose. To further investigate her L leg edema, please arrange L lower ext venous duplex to r/o DVT. Arrange Repeat BMET in 2 weeks.  Tell patient that if Creatinine remains < 1.5 at that time, we will need to adjust dose of Eliquis. Tereso NewcomerScott Weaver, PA-C   01/21/2016 8:06 AM

## 2016-01-21 NOTE — Patient Outreach (Signed)
Call to patient's Cardiologist, Tereso NewcomerScott Weaver [phone]) to:  1) Confirm whether Ms. Cumpton should continue to take aspirin. Note Ms. Figiel reports taking both aspirin and Eliquis. Note that taking aspirin with Eliquis may increase the patient's risk of bleeding.  2) Determine whether she should have stopped amlodipine  3) to find out whether she should be taking diltiazem.   Patient reports that she is taking amlodipine 5mg  daily. Reports that she is not taking diltiazem, although listed on her EPIC medication list as taking diltiazem CD 240 mg by mouth daily. Note that per patient's discharge summary in EPIC from 01/01/16, she was to stop amlodipine and start diltiazem at that time.   Left a message with Neysa BonitoChristy in the office. If have not heard back by 01/23/16, will follow up again at that time.  Duanne MoronElisabeth Myelle Poteat, PharmD Clinical Pharmacist [facility] Care Management [phone]

## 2016-01-21 NOTE — Telephone Encounter (Signed)
[facility] [place]. Spoke w/ her regarding pt meds. She had questions regarding pt's medication regimen. Pt was discharged from hospital 6/1 F/u was w/ Tereso NewcomerScott Weaver on 6/19 Next appt w Dr Rennis GoldenHilty 7/31  Notes as she discussed w/ patient today, patient was not aware she was supposed to be on cardizem and off of amlodipine. Patient is still taking amlodipine at pre-hospital admission dose. We discussed and are not sure where the miscommunication occurred.  She spoke w patient and did not feel there were any cognitive barriers to instruction.  Requesting guidance on med changes - should we instruct the patient to discontinue amlodipine now and initiate the cardizem? [For now, I recommended pt continue current med therapy.]  She also notes there may be a cost issue -  pt's Eliquis is a Tier 3 med and expensive for her - notes the cardizem as written would also be a tier 3. She would like this to be considered for review. Will include pharmD on message routing for recommendations.  Also of note: patient on ASA 81mg  and Eliquis.  Elisabeth's concern was for elevated bleed risk.  Requesting provider to address.   _____________  AddendumSherron Monday:  Spoke w/ patient. She voiced no concerns.  She voiced acknowledgment of discussion w/ Gentry Fitzlisabeth. Notes no symptoms or problems.  Asked if any acute concerns such as chest pain or shortness of breath, or palpitations. She denies. She mentions SOB which she had recently is better --( referred to thoracentesis performed on 6/16).  Pt aware to call if new concerns and that I will route inquiry regarding medication management to care team.  She does not check her BP or HR at home.

## 2016-01-22 ENCOUNTER — Ambulatory Visit (HOSPITAL_COMMUNITY)
Admission: RE | Admit: 2016-01-22 | Discharge: 2016-01-22 | Disposition: A | Discharge status: Home / Self Care | Payer: Medicare Other | Source: Ambulatory Visit | Attending: Physician Assistant | Admitting: Physician Assistant

## 2016-01-22 DIAGNOSIS — I4891 Unspecified atrial fibrillation: Secondary | ICD-10-CM | POA: Diagnosis not present

## 2016-01-22 DIAGNOSIS — Z8701 Personal history of pneumonia (recurrent): Secondary | ICD-10-CM | POA: Diagnosis not present

## 2016-01-22 DIAGNOSIS — I2510 Atherosclerotic heart disease of native coronary artery without angina pectoris: Secondary | ICD-10-CM | POA: Insufficient documentation

## 2016-01-22 DIAGNOSIS — I130 Hypertensive heart and chronic kidney disease with heart failure and stage 1 through stage 4 chronic kidney disease, or unspecified chronic kidney disease: Secondary | ICD-10-CM | POA: Diagnosis not present

## 2016-01-22 DIAGNOSIS — I5032 Chronic diastolic (congestive) heart failure: Secondary | ICD-10-CM | POA: Diagnosis not present

## 2016-01-22 DIAGNOSIS — R600 Localized edema: Secondary | ICD-10-CM | POA: Diagnosis not present

## 2016-01-22 DIAGNOSIS — E1122 Type 2 diabetes mellitus with diabetic chronic kidney disease: Secondary | ICD-10-CM | POA: Insufficient documentation

## 2016-01-22 DIAGNOSIS — N183 Chronic kidney disease, stage 3 (moderate): Secondary | ICD-10-CM | POA: Diagnosis not present

## 2016-01-22 DIAGNOSIS — I5033 Acute on chronic diastolic (congestive) heart failure: Secondary | ICD-10-CM | POA: Diagnosis not present

## 2016-01-22 NOTE — Telephone Encounter (Signed)
Left message for [name] to call our office.

## 2016-01-22 NOTE — Telephone Encounter (Signed)
Ok to stay on Amlodipine. Do not start Diltiazem. Ok to remain on ASA for now Please start assistance paperwork for Eliquis.  If no success with getting assistance for Eliquis, we will have to change to Coumadin. Tereso NewcomerScott Aliani Caccavale, PA-C   01/22/2016 5:12 PM

## 2016-01-23 ENCOUNTER — Telehealth: Payer: Self-pay | Admitting: *Deleted

## 2016-01-23 ENCOUNTER — Other Ambulatory Visit: Payer: Self-pay | Admitting: Pharmacist

## 2016-01-23 DIAGNOSIS — I2510 Atherosclerotic heart disease of native coronary artery without angina pectoris: Secondary | ICD-10-CM | POA: Diagnosis not present

## 2016-01-23 DIAGNOSIS — N183 Chronic kidney disease, stage 3 (moderate): Secondary | ICD-10-CM | POA: Diagnosis not present

## 2016-01-23 DIAGNOSIS — I5033 Acute on chronic diastolic (congestive) heart failure: Secondary | ICD-10-CM | POA: Diagnosis not present

## 2016-01-23 DIAGNOSIS — Z8701 Personal history of pneumonia (recurrent): Secondary | ICD-10-CM | POA: Diagnosis not present

## 2016-01-23 DIAGNOSIS — I130 Hypertensive heart and chronic kidney disease with heart failure and stage 1 through stage 4 chronic kidney disease, or unspecified chronic kidney disease: Secondary | ICD-10-CM | POA: Diagnosis not present

## 2016-01-23 DIAGNOSIS — I4891 Unspecified atrial fibrillation: Secondary | ICD-10-CM | POA: Diagnosis not present

## 2016-01-23 NOTE — Patient Outreach (Signed)
[facility] [place] [facility] in response to my message left on 01/21/16.   Per message from [place], per Tereso NewcomerScott Weaver, patient to continue on amlodipine, not start the diltiazem and to continue on aspirin with the Eliquis for now. In regard to concern about patient cost for Eliquis, Stanton KidneyDebra states that she will start the patient assistance paperwork for Eliquis for the patient today.   Stanton KidneyDebra states that she has already let [name] know about the patient assistance paperwork, but that she will also call her to provide her with the instructions from Tereso NewcomerScott Weaver regarding continuing the amlodipine, not starting diltiazem and continuing on the aspirin for now.  Will call to follow up with [name] today in order to confirm that she has no further questions.  Duanne MoronElisabeth Valery Amedee, PharmD Clinical Pharmacist [facility] Management [phone]

## 2016-01-23 NOTE — Telephone Encounter (Signed)
Follow-up      [name] is returning Debra's call.

## 2016-01-23 NOTE — Telephone Encounter (Signed)
Left detailed message of instructions for [name]. Will start eliquis paperwork. [name] is to call with questions.

## 2016-01-23 NOTE — Telephone Encounter (Signed)
Patient voiced understanding of medications and changes.

## 2016-01-23 NOTE — Telephone Encounter (Signed)
Pt notified of ultrasound results, no DVT. Pt said thank you.

## 2016-01-23 NOTE — Telephone Encounter (Signed)
Spoke with pt, paperwork for eliquis assistance placed in the mail to the patient. Our part given to [place]

## 2016-01-23 NOTE — Patient Outreach (Signed)
Called to follow up with [name]. Patient states that she received a call from [facility] regarding continuing her amlodipine and aspirin, and regarding the Eliquis patient assistance paperwork.  Patient states that she has no further questions for me at this time. Confirm that patient has my phone number. Will close pharmacy episode at this time.  Duanne MoronElisabeth Maryellen Dowdle, PharmD Clinical Pharmacist [facility] [phone]

## 2016-01-26 ENCOUNTER — Ambulatory Visit: Payer: Medicare Other | Admitting: Pulmonary Disease

## 2016-01-27 DIAGNOSIS — I2510 Atherosclerotic heart disease of native coronary artery without angina pectoris: Secondary | ICD-10-CM | POA: Diagnosis not present

## 2016-01-27 DIAGNOSIS — I4891 Unspecified atrial fibrillation: Secondary | ICD-10-CM | POA: Diagnosis not present

## 2016-01-27 DIAGNOSIS — I130 Hypertensive heart and chronic kidney disease with heart failure and stage 1 through stage 4 chronic kidney disease, or unspecified chronic kidney disease: Secondary | ICD-10-CM | POA: Diagnosis not present

## 2016-01-27 DIAGNOSIS — I5033 Acute on chronic diastolic (congestive) heart failure: Secondary | ICD-10-CM | POA: Diagnosis not present

## 2016-01-27 DIAGNOSIS — Z8701 Personal history of pneumonia (recurrent): Secondary | ICD-10-CM | POA: Diagnosis not present

## 2016-01-27 DIAGNOSIS — N183 Chronic kidney disease, stage 3 (moderate): Secondary | ICD-10-CM | POA: Diagnosis not present

## 2016-01-28 ENCOUNTER — Other Ambulatory Visit: Payer: Self-pay | Admitting: *Deleted

## 2016-01-28 ENCOUNTER — Other Ambulatory Visit: Payer: Self-pay

## 2016-01-28 ENCOUNTER — Telehealth: Payer: Self-pay | Admitting: Internal Medicine

## 2016-01-28 DIAGNOSIS — I2510 Atherosclerotic heart disease of native coronary artery without angina pectoris: Secondary | ICD-10-CM | POA: Diagnosis not present

## 2016-01-28 DIAGNOSIS — I130 Hypertensive heart and chronic kidney disease with heart failure and stage 1 through stage 4 chronic kidney disease, or unspecified chronic kidney disease: Secondary | ICD-10-CM | POA: Diagnosis not present

## 2016-01-28 DIAGNOSIS — Z8701 Personal history of pneumonia (recurrent): Secondary | ICD-10-CM | POA: Diagnosis not present

## 2016-01-28 DIAGNOSIS — N183 Chronic kidney disease, stage 3 (moderate): Secondary | ICD-10-CM | POA: Diagnosis not present

## 2016-01-28 DIAGNOSIS — I5033 Acute on chronic diastolic (congestive) heart failure: Secondary | ICD-10-CM | POA: Diagnosis not present

## 2016-01-28 DIAGNOSIS — I4891 Unspecified atrial fibrillation: Secondary | ICD-10-CM | POA: Diagnosis not present

## 2016-01-28 MED ORDER — APIXABAN 2.5 MG PO TABS: 2.5000 mg | ORAL_TABLET | Freq: Two times a day (BID) | ORAL | Status: DC

## 2016-01-28 NOTE — Patient Outreach (Signed)
[facility]) Care Management  01/28/2016  [name] 01/02/1932 [id number]    RNCM made scheduled home visit for community care coordination. Patient's grandson, Dionne Milo, from [place] was present.  Patient consented to meet with me with grandson presnet. Patient states she has been doing good, has consistent weight this week around 160 pounds.  Patient and RNCM viewed the following EMMI videos:  Heart Failure Low Sodium Diet Heart Failure and Daily Weights Recognizing Sodium in Diet  Patient stated the videos were very helpful and she learned a lot. Patient and this RNCM updated her case management goals, noting patient met her short term goal.  Plan: Telephone contact next week for assessment of community care management needs.

## 2016-01-28 NOTE — Telephone Encounter (Signed)
LM for patient regarding eliquis - patient was notified on 01/23/16 (epic - tele note 6/21) of patient assistance process by fellow RN - called patient to inform her that MD portion of application was completed so that she can submit with her portion of the application.

## 2016-01-29 DIAGNOSIS — Z940 Kidney transplant status: Secondary | ICD-10-CM | POA: Diagnosis not present

## 2016-01-30 ENCOUNTER — Telehealth: Payer: Self-pay

## 2016-01-30 DIAGNOSIS — I5033 Acute on chronic diastolic (congestive) heart failure: Secondary | ICD-10-CM | POA: Diagnosis not present

## 2016-01-30 DIAGNOSIS — Z8701 Personal history of pneumonia (recurrent): Secondary | ICD-10-CM | POA: Diagnosis not present

## 2016-01-30 DIAGNOSIS — I2510 Atherosclerotic heart disease of native coronary artery without angina pectoris: Secondary | ICD-10-CM | POA: Diagnosis not present

## 2016-01-30 DIAGNOSIS — N183 Chronic kidney disease, stage 3 (moderate): Secondary | ICD-10-CM | POA: Diagnosis not present

## 2016-01-30 DIAGNOSIS — I130 Hypertensive heart and chronic kidney disease with heart failure and stage 1 through stage 4 chronic kidney disease, or unspecified chronic kidney disease: Secondary | ICD-10-CM | POA: Diagnosis not present

## 2016-01-30 DIAGNOSIS — I4891 Unspecified atrial fibrillation: Secondary | ICD-10-CM | POA: Diagnosis not present

## 2016-01-30 NOTE — Telephone Encounter (Signed)
Lm for rtc 

## 2016-01-30 NOTE — Telephone Encounter (Signed)
Kendra from [facility] requesting VO. Please call back. 

## 2016-02-02 NOTE — Telephone Encounter (Signed)
[facility] for 3 more weeks. Do you agree?

## 2016-02-03 ENCOUNTER — Emergency Department (HOSPITAL_COMMUNITY): Payer: Medicare Other

## 2016-02-03 ENCOUNTER — Encounter (HOSPITAL_COMMUNITY): Payer: Self-pay | Admitting: Emergency Medicine

## 2016-02-03 ENCOUNTER — Inpatient Hospital Stay (HOSPITAL_COMMUNITY)
Admission: EM | Admit: 2016-02-03 | Discharge: 2016-02-06 | DRG: 309 | Disposition: A | Discharge status: Home / Self Care | Payer: Medicare Other | Attending: Cardiovascular Disease | Admitting: Cardiovascular Disease

## 2016-02-03 DIAGNOSIS — I252 Old myocardial infarction: Secondary | ICD-10-CM | POA: Diagnosis not present

## 2016-02-03 DIAGNOSIS — Z87891 Personal history of nicotine dependence: Secondary | ICD-10-CM

## 2016-02-03 DIAGNOSIS — R000 Tachycardia, unspecified: Secondary | ICD-10-CM | POA: Diagnosis not present

## 2016-02-03 DIAGNOSIS — R079 Chest pain, unspecified: Secondary | ICD-10-CM | POA: Diagnosis not present

## 2016-02-03 DIAGNOSIS — I480 Paroxysmal atrial fibrillation: Principal | ICD-10-CM | POA: Diagnosis present

## 2016-02-03 DIAGNOSIS — Z7982 Long term (current) use of aspirin: Secondary | ICD-10-CM

## 2016-02-03 DIAGNOSIS — Z882 Allergy status to sulfonamides status: Secondary | ICD-10-CM

## 2016-02-03 DIAGNOSIS — I248 Other forms of acute ischemic heart disease: Secondary | ICD-10-CM | POA: Diagnosis present

## 2016-02-03 DIAGNOSIS — I132 Hypertensive heart and chronic kidney disease with heart failure and with stage 5 chronic kidney disease, or end stage renal disease: Secondary | ICD-10-CM | POA: Diagnosis present

## 2016-02-03 DIAGNOSIS — Z79899 Other long term (current) drug therapy: Secondary | ICD-10-CM

## 2016-02-03 DIAGNOSIS — I5032 Chronic diastolic (congestive) heart failure: Secondary | ICD-10-CM | POA: Diagnosis present

## 2016-02-03 DIAGNOSIS — I4891 Unspecified atrial fibrillation: Secondary | ICD-10-CM | POA: Diagnosis not present

## 2016-02-03 DIAGNOSIS — I2510 Atherosclerotic heart disease of native coronary artery without angina pectoris: Secondary | ICD-10-CM

## 2016-02-03 DIAGNOSIS — Z8249 Family history of ischemic heart disease and other diseases of the circulatory system: Secondary | ICD-10-CM | POA: Diagnosis not present

## 2016-02-03 DIAGNOSIS — Z940 Kidney transplant status: Secondary | ICD-10-CM

## 2016-02-03 DIAGNOSIS — R404 Transient alteration of awareness: Secondary | ICD-10-CM | POA: Diagnosis not present

## 2016-02-03 DIAGNOSIS — E1122 Type 2 diabetes mellitus with diabetic chronic kidney disease: Secondary | ICD-10-CM | POA: Diagnosis present

## 2016-02-03 DIAGNOSIS — I447 Left bundle-branch block, unspecified: Secondary | ICD-10-CM | POA: Diagnosis present

## 2016-02-03 DIAGNOSIS — Z7901 Long term (current) use of anticoagulants: Secondary | ICD-10-CM | POA: Diagnosis not present

## 2016-02-03 DIAGNOSIS — Z9861 Coronary angioplasty status: Secondary | ICD-10-CM

## 2016-02-03 DIAGNOSIS — R531 Weakness: Secondary | ICD-10-CM | POA: Diagnosis not present

## 2016-02-03 LAB — BRAIN NATRIURETIC PEPTIDE: B Natriuretic Peptide: 687.2 pg/mL — ABNORMAL HIGH (ref 0.0–100.0)

## 2016-02-03 LAB — BASIC METABOLIC PANEL
ANION GAP: 11 (ref 5–15)
BUN: 21 mg/dL — ABNORMAL HIGH (ref 6–20)
CALCIUM: 10.5 mg/dL — AB (ref 8.9–10.3)
CO2: 23 mmol/L (ref 22–32)
Chloride: 100 mmol/L — ABNORMAL LOW (ref 101–111)
Creatinine, Ser: 1.23 mg/dL — ABNORMAL HIGH (ref 0.44–1.00)
GFR calc Af Amer: 46 mL/min — ABNORMAL LOW (ref >60)
GFR, EST NON AFRICAN AMERICAN: 40 mL/min — AB (ref >60)
GLUCOSE: 197 mg/dL — AB (ref 65–99)
POTASSIUM: 4.1 mmol/L (ref 3.5–5.1)
SODIUM: 134 mmol/L — AB (ref 135–145)

## 2016-02-03 LAB — MRSA PCR SCREENING: MRSA by PCR: POSITIVE — AB

## 2016-02-03 LAB — I-STAT TROPONIN, ED: TROPONIN I, POC: 0.22 ng/mL — AB (ref 0.00–0.08)

## 2016-02-03 LAB — CBC
HEMATOCRIT: 39.9 % (ref 36.0–46.0)
HEMOGLOBIN: 12.9 g/dL (ref 12.0–15.0)
MCH: 29.3 pg (ref 26.0–34.0)
MCHC: 32.3 g/dL (ref 30.0–36.0)
MCV: 90.5 fL (ref 78.0–100.0)
Platelets: 244 10*3/uL (ref 150–400)
RBC: 4.41 MIL/uL (ref 3.87–5.11)
RDW: 14.3 % (ref 11.5–15.5)
WBC: 9.6 10*3/uL (ref 4.0–10.5)

## 2016-02-03 LAB — TROPONIN I
TROPONIN I: 0.2 ng/mL — AB (ref <0.03)
Troponin I: 0.23 ng/mL (ref <0.03)

## 2016-02-03 LAB — MAGNESIUM: Magnesium: 2.1 mg/dL (ref 1.7–2.4)

## 2016-02-03 MED ORDER — FUROSEMIDE 80 MG PO TABS
80.0000 mg | ORAL_TABLET | Freq: Two times a day (BID) | ORAL | Status: DC
  Administered 2016-02-03 – 2016-02-06 (×6): 80 mg via ORAL
  Filled 2016-02-03 (×2): qty 1
  Filled 2016-02-03: qty 4
  Filled 2016-02-03 (×2): qty 1
  Filled 2016-02-03: qty 4

## 2016-02-03 MED ORDER — APIXABAN 5 MG PO TABS
5.0000 mg | ORAL_TABLET | Freq: Two times a day (BID) | ORAL | Status: DC
  Administered 2016-02-03 – 2016-02-06 (×6): 5 mg via ORAL
  Filled 2016-02-03 (×6): qty 1

## 2016-02-03 MED ORDER — MUPIROCIN 2 % EX OINT
1.0000 "application " | TOPICAL_OINTMENT | Freq: Two times a day (BID) | CUTANEOUS | Status: DC
  Administered 2016-02-03 – 2016-02-06 (×6): 1 via NASAL
  Filled 2016-02-03 (×3): qty 22

## 2016-02-03 MED ORDER — TACROLIMUS 1 MG PO CAPS
1.5000 mg | ORAL_CAPSULE | Freq: Two times a day (BID) | ORAL | Status: DC
  Administered 2016-02-03 – 2016-02-04 (×3): 1.5 mg via ORAL
  Administered 2016-02-04: 1 mg via ORAL
  Administered 2016-02-05 – 2016-02-06 (×3): 1.5 mg via ORAL
  Filled 2016-02-03 (×7): qty 1

## 2016-02-03 MED ORDER — ATORVASTATIN CALCIUM 10 MG PO TABS
10.0000 mg | ORAL_TABLET | Freq: Every day | ORAL | Status: DC
  Administered 2016-02-03 – 2016-02-05 (×3): 10 mg via ORAL
  Filled 2016-02-03 (×3): qty 1

## 2016-02-03 MED ORDER — DILTIAZEM HCL 100 MG IV SOLR
5.0000 mg/h | Freq: Once | INTRAVENOUS | Status: AC
  Administered 2016-02-03: 5 mg/h via INTRAVENOUS
  Filled 2016-02-03: qty 100

## 2016-02-03 MED ORDER — AMIODARONE HCL IN DEXTROSE 360-4.14 MG/200ML-% IV SOLN
60.0000 mg/h | INTRAVENOUS | Status: DC
  Administered 2016-02-03 (×2): 60 mg/h via INTRAVENOUS
  Filled 2016-02-03 (×2): qty 200

## 2016-02-03 MED ORDER — DILTIAZEM HCL 25 MG/5ML IV SOLN
20.0000 mg | Freq: Once | INTRAVENOUS | Status: AC
  Administered 2016-02-03: 20 mg via INTRAVENOUS
  Filled 2016-02-03: qty 5

## 2016-02-03 MED ORDER — APIXABAN 2.5 MG PO TABS: 2.5000 mg | ORAL_TABLET | Freq: Two times a day (BID) | ORAL | Status: DC

## 2016-02-03 MED ORDER — CINACALCET HCL 30 MG PO TABS
30.0000 mg | ORAL_TABLET | Freq: Every day | ORAL | Status: DC
  Administered 2016-02-03 – 2016-02-05 (×3): 30 mg via ORAL
  Filled 2016-02-03 (×3): qty 1

## 2016-02-03 MED ORDER — ASPIRIN 81 MG PO CHEW
324.0000 mg | CHEWABLE_TABLET | Freq: Once | ORAL | Status: AC
  Administered 2016-02-03: 324 mg via ORAL
  Filled 2016-02-03: qty 4

## 2016-02-03 MED ORDER — AMIODARONE LOAD VIA INFUSION
150.0000 mg | Freq: Once | INTRAVENOUS | Status: AC
  Administered 2016-02-03: 150 mg via INTRAVENOUS
  Filled 2016-02-03: qty 83.34

## 2016-02-03 MED ORDER — METOPROLOL TARTRATE 50 MG PO TABS
50.0000 mg | ORAL_TABLET | Freq: Every day | ORAL | Status: DC
  Administered 2016-02-03 – 2016-02-06 (×4): 50 mg via ORAL
  Filled 2016-02-03: qty 1
  Filled 2016-02-03: qty 2
  Filled 2016-02-03: qty 1
  Filled 2016-02-03: qty 2

## 2016-02-03 MED ORDER — CETYLPYRIDINIUM CHLORIDE 0.05 % MT LIQD
7.0000 mL | Freq: Two times a day (BID) | OROMUCOSAL | Status: DC
  Administered 2016-02-04 – 2016-02-06 (×5): 7 mL via OROMUCOSAL

## 2016-02-03 MED ORDER — MYCOPHENOLATE MOFETIL 250 MG PO CAPS
500.0000 mg | ORAL_CAPSULE | Freq: Two times a day (BID) | ORAL | Status: DC
  Administered 2016-02-03 – 2016-02-06 (×7): 500 mg via ORAL
  Filled 2016-02-03 (×7): qty 2

## 2016-02-03 MED ORDER — ONDANSETRON HCL 4 MG/2ML IJ SOLN
4.0000 mg | Freq: Four times a day (QID) | INTRAMUSCULAR | Status: DC | PRN
  Administered 2016-02-03 – 2016-02-04 (×2): 4 mg via INTRAVENOUS
  Filled 2016-02-03 (×2): qty 2

## 2016-02-03 MED ORDER — DEXTROSE 5 % IV SOLN: 60.0000 mg/h | Freq: Once | INTRAVENOUS | Status: DC

## 2016-02-03 MED ORDER — CHLORHEXIDINE GLUCONATE CLOTH 2 % EX PADS
6.0000 | MEDICATED_PAD | Freq: Every day | CUTANEOUS | Status: DC
  Administered 2016-02-04 – 2016-02-06 (×2): 6 via TOPICAL

## 2016-02-03 MED ORDER — FAMOTIDINE 20 MG PO TABS
20.0000 mg | ORAL_TABLET | Freq: Two times a day (BID) | ORAL | Status: DC
  Administered 2016-02-03 – 2016-02-05 (×5): 20 mg via ORAL
  Filled 2016-02-03 (×5): qty 1

## 2016-02-03 MED ORDER — AMIODARONE IV BOLUS ONLY 150 MG/100ML: 150.0000 mg | Freq: Once | INTRAVENOUS | Status: DC

## 2016-02-03 MED ORDER — DILTIAZEM HCL 100 MG IV SOLR: 5.0000 mg/h | INTRAVENOUS | Status: DC

## 2016-02-03 MED ORDER — AMIODARONE HCL IN DEXTROSE 360-4.14 MG/200ML-% IV SOLN
30.0000 mg/h | INTRAVENOUS | Status: DC
  Administered 2016-02-03: 30 mg/h via INTRAVENOUS
  Filled 2016-02-03: qty 200

## 2016-02-03 MED ORDER — ALLOPURINOL 100 MG PO TABS
100.0000 mg | ORAL_TABLET | Freq: Every day | ORAL | Status: DC
  Administered 2016-02-03 – 2016-02-05 (×3): 100 mg via ORAL
  Filled 2016-02-03 (×3): qty 1

## 2016-02-03 NOTE — Progress Notes (Signed)
Patient on Eliquis 2.5 mg PO BID PTA for atrial fibrillation. This patients CHA2DS2-VASc Score and unadjusted Ischemic Stroke Rate (% per year) is equal to 11.2 % stroke rate/year from a score of 7.  Recently SCr was elevated >1.5 and with age [age], she was on reduced dose of 2.5 mg PO BID. At office visit on 6/19, SCr down to 1.21 and this admission remains below 1.5 (1.23). CBC is wnl and no bleeding has been reported. Per discussion with Dr. Eden EmmsNishan and with possible DCCV tomorrow, have increased dose appropriately to 5 mg PO BID. Will continue to monitor SCr for significant changes and future recommendations.   Tyler DeisErika K. Bonnye FavaNicolsen, PharmD, BCPS, CPP Clinical Pharmacist Pager: [phone] Phone: [phone] 02/03/2016 1:53 PM

## 2016-02-03 NOTE — Progress Notes (Signed)
Spoke with Dr. Eden EmmsNishan, pt HR 58 out of Afib, Cardizem drip stopped, MD would like to keep Amio drip infusing, no EKG needed at this time per MD, will continue monitor.

## 2016-02-03 NOTE — ED Notes (Signed)
Family at bedside. 

## 2016-02-03 NOTE — ED Provider Notes (Signed)
CSN: [id number]     Arrival date & time 02/03/16  1003 History   First MD Initiated Contact with Patient 02/03/16 1011     Chief Complaint  Patient presents with  . Irregular Heart Beat     (Consider location/radiation/quality/duration/timing/severity/associated sxs/prior Treatment) Patient is a 82 y.o. female presenting with general illness. The history is provided by the patient.  Illness Severity:  Moderate Onset quality:  Gradual Duration:  4 days Timing:  Constant Progression:  Worsening Chronicity:  New Associated symptoms: chest pain, fatigue and shortness of breath   Associated symptoms: no congestion, no fever, no headaches, no myalgias, no nausea, no rhinorrhea, no vomiting and no wheezing     82 yo F With a chief complaint of chest pain and palpitations. This been going on for the past 4 days. Patient extremely weak getting up and moving. Will able to take a few steps at a time without feeling she is to pass out. Symptoms got worse this morning. Denies cough congestion fevers.  Patient was recently seen and admitted to the hospital for a similar presentation. She is found to have a new left bundle branch block and atrial fibrillation. At discharge she was started on Eliquis. She states that she has been taking that medicine regularly since then.  Past Medical History  Diagnosis Date  . S/P kidney transplant     a. due to FSGN, follows with Dr. Vivia BirminghamMattingly-->Transplant in 2003. Was on HD prior to that.  . Coronary artery disease     a. 01/2001 Cath/PCI: LAD 50p, 3653m, D1 50-60, RCA 1174m (3.0x13 BX Velocity BMS).// b. NSTEMI 5/17 (demand ischemia) in setting of AF with RVR, pneumonia, CKD, a/c HF >> Myoview 12/26/15: prob inf infarct, no ischemia, EF 52%, Low Risk >> med Rx  . Hypertensive heart disease   . Cholelithiasis   . Chronic diastolic CHF (congestive heart failure) (HCC)     a. 06/2011 Echo: EF 60-65%, no rwma, Gr1 DD, mild TR. // b. Echo 12/19/15: Severe LVH, EF 55-60%,  normal wall motion, mild MR, moderate LAE, moderate TR, PASP 44 mmHg  . Gout     unconfirmed by joint aspiration  . CKD (chronic kidney disease), stage III     a. in setting of prior ESRD and cadaveric tx in 2003.  . Candidal esophagitis (HCC)     a. 03/2014.  Marland Kitchen. Gastritis     a. 03/2014  . Type II diabetes mellitus (HCC)   . History of bacteremia     a. 08/2004 Group A Strep bacteremia.   Past Surgical History  Procedure Laterality Date  . Kidney transplant  2003  . Abdominal hysterectomy    . Esophagogastroduodenoscopy N/A 03/20/2014    Procedure: ESOPHAGOGASTRODUODENOSCOPY (EGD);  Surgeon: Theda BelfastPatrick D Hung, MD;  [facility];  Service: Endoscopy;  Laterality: N/A;   Family History  Problem Relation Age of Onset  . CAD Father   . CAD Brother   . CAD      Both parents and multiple siblings have died from coronary disease prior to age 80 and several in their 7140's and 1950's.   Social History  Substance Use Topics  . Smoking status: Former Smoker    Quit date: 04/19/1969  . Smokeless tobacco: Never Used  . Alcohol Use: No   OB History    No data available     Review of Systems  Constitutional: Positive for fatigue. Negative for fever and chills.  HENT: Negative for congestion and rhinorrhea.  Eyes: Negative for redness and visual disturbance.  Respiratory: Positive for shortness of breath. Negative for wheezing.   Cardiovascular: Positive for chest pain and palpitations.  Gastrointestinal: Negative for nausea and vomiting.  Genitourinary: Negative for dysuria and urgency.  Musculoskeletal: Negative for myalgias and arthralgias.  Skin: Negative for pallor and wound.  Neurological: Negative for dizziness and headaches.      Allergies  Sulfa drugs cross reactors  Home Medications   Prior to Admission medications   Medication Sig Start Date End Date Taking? Authorizing Provider  allopurinol (ZYLOPRIM) 100 MG tablet Take 100 mg by mouth at bedtime.   Yes  Historical Provider, MD  amLODipine (NORVASC) 10 MG tablet Take 5 mg by mouth daily.   Yes Historical Provider, MD  apixaban (ELIQUIS) 2.5 MG TABS tablet Take 1 tablet (2.5 mg total) by mouth 2 (two) times daily. 01/28/16  Yes Chrystie Nose, MD  aspirin EC 81 MG tablet Take 81 mg by mouth daily.   Yes Historical Provider, MD  atorvastatin (LIPITOR) 10 MG tablet Take 1 tablet (10 mg total) by mouth daily at 6 PM. 12/28/15  Yes Richarda Overlie, MD  cinacalcet (SENSIPAR) 90 MG tablet Take 30 mg by mouth daily.    Yes Historical Provider, MD  colchicine 0.6 MG tablet Take 0.6 mg by mouth daily as needed (FOR GOUT). Reported on 01/21/2016 04/26/12  Yes Almyra Deforest, MD  famotidine (PEPCID) 20 MG tablet Take 20 mg by mouth 2 (two) times daily.   Yes Historical Provider, MD  furosemide (LASIX) 80 MG tablet Take 80 mg by mouth 2 (two) times daily.   Yes Historical Provider, MD  metoprolol (LOPRESSOR) 50 MG tablet Take 50 mg by mouth daily.    Yes Historical Provider, MD  mycophenolate (CELLCEPT) 250 MG capsule Take 500 mg by mouth 2 (two) times daily.   Yes Historical Provider, MD  tacrolimus (PROGRAF) 0.5 MG capsule 1 mg in the morning and 1 mg at bedtime Patient taking differently: Take 1.5 mg by mouth 2 (two) times daily.  01/01/16  Yes Richarda Overlie, MD   BP 124/85 mmHg  Pulse 139  Resp 31  Ht  (1.6 m)  Wt 160 lb (72.576 kg)  BMI 28.35 kg/m2  SpO2 100% Physical Exam  Constitutional: She is oriented to person, place, and time. She appears well-developed and well-nourished. No distress.  HENT:  Head: Normocephalic and atraumatic.  Eyes: EOM are normal. Pupils are equal, round, and reactive to light.  Neck: Normal range of motion. Neck supple.  Cardiovascular: An irregularly irregular rhythm present. Tachycardia present.  Exam reveals no gallop and no friction rub.   No murmur heard. Pulmonary/Chest: Effort normal. She has no wheezes. She has no rales.  Abdominal: Soft. She exhibits no  distension. There is no tenderness. There is no rebound and no guarding.  Musculoskeletal: She exhibits no edema or tenderness.  Neurological: She is alert and oriented to person, place, and time.  Skin: Skin is warm and dry. She is not diaphoretic.  Psychiatric: She has a normal mood and affect. Her behavior is normal.  Nursing note and vitals reviewed.   ED Course  Procedures (including critical care time) Labs Review Labs Reviewed  BASIC METABOLIC PANEL - Abnormal; Notable for the following:    Sodium 134 (*)    Chloride 100 (*)    Glucose, Bld 197 (*)    BUN 21 (*)    Creatinine, Ser 1.23 (*)    Calcium 10.5 (*)  GFR calc non Af Amer 40 (*)    GFR calc Af Amer 46 (*)    All other components within normal limits  BRAIN NATRIURETIC PEPTIDE - Abnormal; Notable for the following:    B Natriuretic Peptide 687.2 (*)    All other components within normal limits  I-STAT TROPOININ, ED - Abnormal; Notable for the following:    Troponin i, poc 0.22 (*)    All other components within normal limits  CBC  MAGNESIUM  URINALYSIS, ROUTINE W REFLEX MICROSCOPIC (NOT AT [facility])    Imaging Review Dg Chest Portable 1 View  02/03/2016  CLINICAL DATA:  Chest pain EXAM: PORTABLE CHEST 1 VIEW COMPARISON:  01/16/2016 FINDINGS: Cardiomegaly again noted. There is small to moderate right pleural effusion with right lower lobe atelectasis or infiltrate. Small amount of fluid right minor fissure. No pulmonary edema. Atherosclerotic calcifications thoracic aorta. Degenerative changes bilateral shoulders. IMPRESSION: No pulmonary edema. Cardiomegaly. Small to moderate right pleural effusion with right lower lobe atelectasis or infiltrate. Electronically Signed   By: Natasha Mead [name]   On: 02/03/2016 10:33   I have personally reviewed and evaluated these images and lab results as part of my medical decision-making.   EKG Interpretation   Date/Time:  Tuesday February 03 2016 10:11:34 EDT Ventricular Rate:   162 PR Interval:    QRS Duration: 168 QT Interval:  342 QTC Calculation: 562 R Axis:   -61 Text Interpretation:  Extreme tachycardia with wide complex, no further  rhythm analysis attempted likely afib rvr Confirmed by Jackson Coffield MD, Reuel Boom  ([id number]) on 02/03/2016 10:21:23 AM Also confirmed by Adela Lank MD, DANIEL  [phone]), editor Whitney Post, Cala Bradford [phone])  on 02/03/2016 10:41:04 AM      MDM   Final diagnoses:  Atrial fibrillation with rapid ventricular response (HCC)    82 yo F with a wide complex tachycardia. Irregular in nature.   The patient was just in the hospital for a similar presentation I suspect this is more likely to be A. fib with RVR with a left bundle branch block then V. Tach. Patient has a stable blood pressure. Will start on a dilt drip. Discussed with cardiology. They feel the most appropriate therapy at this time his cardioversion. Patient however does not feel comfortable with this and is concerned about the sedation process. Sedative discussed with the patient at length about the procedure. She feels that she needs at least a day to think about this. Per cardiology will start her on amiodarone drip. Patient also with elevated trop, likely demand ischemia. Admit.  CRITICAL CARE Performed by: Rae Roam   Total critical care time: 40 minutes  Critical care time was exclusive of separately billable procedures and treating other patients.  Critical care was necessary to treat or prevent imminent or life-threatening deterioration.  Critical care was time spent personally by me on the following activities: development of treatment plan with patient and/or surrogate as well as nursing, discussions with consultants, evaluation of patient's response to treatment, examination of patient, obtaining history from patient or surrogate, ordering and performing treatments and interventions, ordering and review of laboratory studies, ordering and review of radiographic studies, pulse  oximetry and re-evaluation of patient's condition.  The patients results and plan were reviewed and discussed.   Any x-rays performed were independently reviewed by myself.   Differential diagnosis were considered with the presenting HPI.  Medications  amiodarone (NEXTERONE) 1.8 mg/mL load via infusion 150 mg (not administered)    Followed by  amiodarone (  NEXTERONE PREMIX) 360-4.14 MG/200ML-% (1.8 mg/mL) IV infusion (not administered)    Followed by  amiodarone (NEXTERONE PREMIX) 360-4.14 MG/200ML-% (1.8 mg/mL) IV infusion (not administered)  diltiazem (CARDIZEM) injection 20 mg (20 mg Intravenous Given 02/03/16 1025)  diltiazem (CARDIZEM) 100 mg in dextrose 5 % 100 mL (1 mg/mL) infusion (15 mg/hr Intravenous Rate/Dose Change 02/03/16 1117)  aspirin chewable tablet 324 mg (324 mg Oral Given 02/03/16 1039)    Filed Vitals:   02/03/16 1030 02/03/16 1045 02/03/16 1102 02/03/16 1115  BP: 121/73 118/76 120/82 124/85  Pulse: 145 151 141 139  Resp: 24 27 20 31   Height:      Weight:      SpO2: 100% 100% 100% 100%    Final diagnoses:  Atrial fibrillation with rapid ventricular response (HCC)    Admission/ observation were discussed with the admitting physician, patient and/or family and they are comfortable with the plan.     Melene Planan Shantrice Rodenberg, DO 02/03/16 1131

## 2016-02-03 NOTE — ED Notes (Signed)
Cardiology at bedside.

## 2016-02-03 NOTE — H&P (Signed)
Physician History and Physical     Patient ID: Joy [name] MRN: [id number] DOB/AGE: 80-24-1933 80 y.o. Admit date: 02/03/2016  Primary Care Physician: Gara Kronerruong, Diana, MD Primary Cardiologist: [place]had Hilty  Active Problems:   * No active hospital problems. *   HPI:   [name] is a 82 y.o.  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 Cardiac cath was done in 2002. Mid RCA 85% lesion was treated with a bare metal stent. She had moderate nonobstructive disease in LAD and diagonal treated medically. She's been seen by different cardiologists during past admissions but has never followed up in the office.   Most recently admitted 5/18-6/1 with community-acquired pneumococcal pneumonia complicated by AF with RVR and non-STEMI. Echo demonstrated normal LV function with an EF of 55-60%. Troponin peaked at 1.26. On rate control therapy, she converted to NSR. Pneumonia was complicated by right pleural effusion. She required thoracentesis x 2. This was likely exudative. Cytology was neg for malignancy. Initially, cardiac catheterization was considered. Nephrology also followed the patient. Her creatinine increased but did improve prior to DC. Given her prior renal transplant, it was felt that cardiac catheterization should be avoided. It was thought that her elevated troponin was likely related to demand ischemia. Inpatient nuclear stress test was arranged. This was low risk and neg for ischemia. Med Rx was recommended. CHADS2-VASc=7 (age > 7275, female, HTN, DM, CHF, CAD). Decision was made to start chronic anticoagulation with Eliquis.   She was seen in FU by Pulmonology Rubye Oaks(Tammy Parrett, NP) on 01/12/16. Patient had re-accumulation of R pleural effusion. She was set up for repeat thoracentesis which was done 6/16.   Seen in ER with 4 days of weakness, dyspnea and fatigue Found to be in rapid  afib with rates as fast as 180 Currently no chest pain but complained to ER doctor of this    Review of systems complete and found to be negative unless listed above   Past Medical History  Diagnosis Date  . S/P kidney transplant     a. due to FSGN, follows with Dr. Vivia BirminghamMattingly-->Transplant in 2003. Was on HD prior to that.  . Coronary artery disease     a. 01/2001 Cath/PCI: LAD 50p, 9250m, D1 50-60, RCA 2938m (3.0x13 BX Velocity BMS).// b. NSTEMI 5/17 (demand ischemia) in setting of AF with RVR, pneumonia, CKD, a/c HF >> Myoview 12/26/15: prob inf infarct, no ischemia, EF 52%, Low Risk >> med Rx  . Hypertensive heart disease   . Cholelithiasis   . Chronic diastolic CHF (congestive heart failure) (HCC)     a. 06/2011 Echo: EF 60-65%, no rwma, Gr1 DD, mild TR. // b. Echo 12/19/15: Severe LVH, EF 55-60%, normal wall motion, mild MR, moderate LAE, moderate TR, PASP 44 mmHg  . Gout     unconfirmed by joint aspiration  . CKD (chronic kidney disease), stage III     a. in setting of prior ESRD and cadaveric tx in 2003.  . Candidal esophagitis (HCC)     a. 03/2014.  Marland Kitchen. Gastritis     a. 03/2014  . Type II diabetes mellitus (HCC)   . History of bacteremia     a. 08/2004 Group A Strep bacteremia.    Family History  Problem Relation Age of Onset  . CAD Father   . CAD Brother   . CAD  Both parents and multiple siblings have died from coronary disease prior to age 33 and several in their 79's and 48's.    Social History   Social History  . Marital Status: Divorced    Spouse Name: N/A  . Number of Children: N/A  . Years of Education: N/A   Occupational History  . Not on file.   Social History Main Topics  . Smoking status: Former Smoker    Quit date: 04/19/1969  . Smokeless tobacco: Never Used  . Alcohol Use: No  . Drug Use: No  . Sexual Activity: Not Currently   Other Topics Concern  . Not on file   Social History Narrative   Patient's husband died in 12-08-2008.  Retired - previously  delivered Rx medications.  Lives in [place] with grandchild.  Does not routinely exercise.    Past Surgical History  Procedure Laterality Date  . Kidney transplant  Dec 08, 2001  . Abdominal hysterectomy    . Esophagogastroduodenoscopy N/A 03/20/2014    Procedure: ESOPHAGOGASTRODUODENOSCOPY (EGD);  Surgeon: Theda Belfast, MD;  Location: [facility] ENDOSCOPY;  Service: Endoscopy;  Laterality: N/A;      (Not in a hospital admission)  Physical Exam: Blood pressure 124/85, pulse 139, resp. rate 31, height  (1.6 m), weight 160 lb (72.576 kg), SpO2 100 %.   Affect appropriate Elderly black female  HEENT: normal Neck supple with no adenopathy JVP normal no bruits no thyromegaly Lungs clear with no wheezing and good diaphragmatic motion Heart:  S1/S2 no murmur, no rub, gallop or click PMI normal Abdomen: benighn, BS positve, no tenderness, no AAA transplanted kidney LA no bruit.  No HSM or HJR Distal pulses intact with no bruits No edema Neuro non-focal Skin warm and dry No muscular weakness  No current facility-administered medications on file prior to encounter.   Current Outpatient Prescriptions on File Prior to Encounter  Medication Sig Dispense Refill  . allopurinol (ZYLOPRIM) 100 MG tablet Take 100 mg by mouth at bedtime.    Marland Kitchen amLODipine (NORVASC) 10 MG tablet Take 5 mg by mouth daily.    Marland Kitchen apixaban (ELIQUIS) 2.5 MG TABS tablet Take 1 tablet (2.5 mg total) by mouth 2 (two) times daily. 180 tablet 3  . aspirin EC 81 MG tablet Take 81 mg by mouth daily.    Marland Kitchen atorvastatin (LIPITOR) 10 MG tablet Take 1 tablet (10 mg total) by mouth daily at 6 PM. 30 tablet 1  . cinacalcet (SENSIPAR) 90 MG tablet Take 30 mg by mouth daily.     . colchicine 0.6 MG tablet Take 0.6 mg by mouth daily as needed (FOR GOUT). Reported on 01/21/2016    . famotidine (PEPCID) 20 MG tablet Take 20 mg by mouth 2 (two) times daily.    . furosemide (LASIX) 80 MG tablet Take 80 mg by mouth 2 (two) times daily.    . metoprolol  (LOPRESSOR) 50 MG tablet Take 50 mg by mouth daily.     . mycophenolate (CELLCEPT) 250 MG capsule Take 500 mg by mouth 2 (two) times daily.    . tacrolimus (PROGRAF) 0.5 MG capsule 1 mg in the morning and 1 mg at bedtime (Patient taking differently: Take 1.5 mg by mouth 2 (two) times daily. ) 120 capsule 0    Labs:   Lab Results  Component Value Date   WBC 9.6 02/03/2016   HGB 12.9 02/03/2016   HCT 39.9 02/03/2016   MCV 90.5 02/03/2016   PLT 244 02/03/2016  Recent Labs Lab 02/03/16 1023  NA 134*  K 4.1  CL 100*  CO2 23  BUN 21*  CREATININE 1.23*  CALCIUM 10.5*  GLUCOSE 197*   Lab Results  Component Value Date   TROPONINI 0.93* 12/19/2015   TROPONINI 1.26* 12/18/2015   TROPONINI 1.04* 12/18/2015     Lab Results  Component Value Date   CHOL 101 12/19/2015   Lab Results  Component Value Date   HDL 34* 12/19/2015   Lab Results  Component Value Date   LDLCALC 50 12/19/2015   Lab Results  Component Value Date   TRIG 83 12/19/2015   Lab Results  Component Value Date   CHOLHDL 3.0 12/19/2015   No results found for: LDLDIRECT     Radiology: Dg Chest 2 View  01/12/2016  CLINICAL DATA:  Followup pneumonia and pleural effusion, dyspnea on exertion, history hypertension, chronic diastolic CHF, chronic kidney disease, type II diabetes mellitus EXAM: CHEST  2 VIEW COMPARISON:  12/31/2015 FINDINGS: Enlargement of cardiac silhouette with pulmonary vascular congestion. Atherosclerotic calcification aorta. Moderate RIGHT pleural effusion increased since previous exam. Increased RIGHT basilar atelectasis involving RIGHT middle and RIGHT lower lobes. Underlying emphysematous changes. Minimal subsegmental atelectasis lingula. No definite acute infiltrate or pneumothorax. Bones demineralized. IMPRESSION: Enlargement of cardiac silhouette with pulmonary vascular congestion. COPD changes with increased RIGHT pleural effusion and basilar atelectasis since prior study.  Electronically Signed   By: Ulyses SouthwardMark  Boles [name]   On: 01/12/2016 15:16   Dg Chest Portable 1 View  02/03/2016  CLINICAL DATA:  Chest pain EXAM: PORTABLE CHEST 1 VIEW COMPARISON:  01/16/2016 FINDINGS: Cardiomegaly again noted. There is small to moderate right pleural effusion with right lower lobe atelectasis or infiltrate. Small amount of fluid right minor fissure. No pulmonary edema. Atherosclerotic calcifications thoracic aorta. Degenerative changes bilateral shoulders. IMPRESSION: No pulmonary edema. Cardiomegaly. Small to moderate right pleural effusion with right lower lobe atelectasis or infiltrate. Electronically Signed   By: [name]  Pop [name]   On: 02/03/2016 10:33   Dg Chest Port 1 View  01/16/2016  CLINICAL DATA:  Status post right-sided thoracentesis EXAM: PORTABLE CHEST 1 VIEW COMPARISON:  January 12, 2016 FINDINGS: Right-sided pleural effusion is smaller following thoracentesis. No pneumothorax evident. There is moderate residual pleural effusion on the right with right base consolidation. The left lung is clear except for minimal left base atelectasis. Heart is borderline enlarged with pulmonary vascularity within normal limits. No adenopathy. No blastic or lytic bone lesions are evident. IMPRESSION: Right pleural effusion is smaller following thoracentesis. Moderate right effusion with right base consolidation does remain. Minimal left base atelectasis present; left lung otherwise clear. Stable cardiac prominence. Electronically Signed   By: Bretta BangWilliam  Woodruff III [name]   On: 01/16/2016 13:10    EKG: rapid afib LBBB  ASSESSMENT AND PLAN:  Afib:  With rapid rate. Patient has been compliant with eliquis no missed doses. Offered [facility] in ER but patient is scared and declined. She wants a trial of medicine to try to convert. Put in context of poor blood flow To kidneys and CAD with demand ischemia and patient still prefers trial of medication [facility].  Will continue iv cardizem hold norvasc.  Add iv  amiodarone 150 mg bolus 60/30 mg drips. Keep NPO in am for  [facility]ible [facility] tomorrow if she continues in afib  CHF:  Continue lasix BID with pleural effusions stable post recent repeat thoracentesis. sats 100% on RA  CAD: mild elevation in troponin ECG not interpretable LBBB.  Likely demand ischemia rate control and conversion needed. Given elevated troponin and CAD history use amiodarone as antiarrythmic   Anticoagulation:  On low dose eliquis   Given elevated Cr and renal transplant no missed doses  CRF: post transplant on cellcept and prograft Recent admission with SEMI ? Demand again in setting of rapid afib no cath due to transplanted kidney   Signed: Theron Arista Nishan7/11/2015, 11:34 AM

## 2016-02-03 NOTE — ED Notes (Signed)
Presents to ED via EMS for onset of chest tightness and fluttering sensation at 0900 when she got up to use the bathroom. EMS reports AFib RVR on their 12-lead. Patient denies history of Afib. PMH of MI x2 with stents. Denies pain or tightness on arrival; reports still feels the fluttering. Reports weakness and fatigue x 1 week.

## 2016-02-03 NOTE — ED Notes (Signed)
Attempted to obtain urine specimen; Pt unable to provide one at this time 

## 2016-02-04 ENCOUNTER — Other Ambulatory Visit: Payer: Medicare Other

## 2016-02-04 DIAGNOSIS — I248 Other forms of acute ischemic heart disease: Secondary | ICD-10-CM

## 2016-02-04 DIAGNOSIS — I5032 Chronic diastolic (congestive) heart failure: Secondary | ICD-10-CM

## 2016-02-04 LAB — BASIC METABOLIC PANEL
ANION GAP: 11 (ref 5–15)
BUN: 24 mg/dL — ABNORMAL HIGH (ref 6–20)
CALCIUM: 9.6 mg/dL (ref 8.9–10.3)
CO2: 23 mmol/L (ref 22–32)
CREATININE: 1.47 mg/dL — AB (ref 0.44–1.00)
Chloride: 98 mmol/L — ABNORMAL LOW (ref 101–111)
GFR, EST AFRICAN AMERICAN: 37 mL/min — AB (ref >60)
GFR, EST NON AFRICAN AMERICAN: 32 mL/min — AB (ref >60)
Glucose, Bld: 152 mg/dL — ABNORMAL HIGH (ref 65–99)
Potassium: 4.2 mmol/L (ref 3.5–5.1)
SODIUM: 132 mmol/L — AB (ref 135–145)

## 2016-02-04 LAB — TROPONIN I: Troponin I: 0.2 ng/mL (ref <0.03)

## 2016-02-04 MED ORDER — AMIODARONE HCL 200 MG PO TABS
400.0000 mg | ORAL_TABLET | Freq: Two times a day (BID) | ORAL | Status: DC
  Administered 2016-02-04 – 2016-02-05 (×4): 400 mg via ORAL
  Filled 2016-02-04 (×4): qty 2

## 2016-02-04 NOTE — Progress Notes (Signed)
Report called to [address].

## 2016-02-04 NOTE — Progress Notes (Signed)
     SUBJECTIVE: No chest pain or dyspnea this am.   Tele: sinus  BP 117/59 mmHg  Pulse 62  Temp(Src) 98.1 F (36.7 C) (Oral)  Resp 18  Ht 5\' 3"  (1.6 m)  Wt 157 lb 11.2 oz (71.532 kg)  BMI 27.94 kg/m2  SpO2 100%  Intake/Output Summary (Last 24 hours) at 02/04/16 0730 Last data filed at 02/03/16 2237  Gross per 24 hour  Intake 894.57 ml  Output    300 ml  Net 594.57 ml    PHYSICAL EXAM General: Well developed, well nourished, in no acute distress. Alert and oriented x 3.  Psych:  Good affect, responds appropriately Neck: No JVD. No masses noted.  Lungs: Clear bilaterally with no wheezes or rhonci noted.  Heart: RRR with no murmurs noted. Abdomen: Bowel sounds are present. Soft, non-tender.  Extremities: No lower extremity edema.   LABS: Basic Metabolic Panel:  Recent Labs  16/05/9606/04/17 1023 02/04/16 0125  NA 134* 132*  K 4.1 4.2  CL 100* 98*  CO2 23 23  GLUCOSE 197* 152*  BUN 21* 24*  CREATININE 1.23* 1.47*  CALCIUM 10.5* 9.6  MG 2.1  --    CBC:  Recent Labs  02/03/16 1023  WBC 9.6  HGB 12.9  HCT 39.9  MCV 90.5  PLT 244   Cardiac Enzymes:  Recent Labs  02/03/16 1341 02/03/16 1832 02/04/16 0124  TROPONINI 0.20* 0.23* 0.20*   Current Meds: . allopurinol  100 mg Oral QHS  . antiseptic oral rinse  7 mL Mouth Rinse BID  . apixaban  5 mg Oral BID  . atorvastatin  10 mg Oral q1800  . Chlorhexidine Gluconate Cloth  6 each Topical Q0600  . cinacalcet  30 mg Oral Q lunch  . famotidine  20 mg Oral BID  . furosemide  80 mg Oral BID  . metoprolol  50 mg Oral Daily  . mupirocin ointment  1 application Nasal BID  . mycophenolate  500 mg Oral BID  . tacrolimus  1.5 mg Oral BID     ASSESSMENT AND PLAN:  1. Atrial fib with RVR: Pt with known paroxysmal atrial fibrillation on Eliquis admitted 02/03/16 by Dr. Eden EmmsNishan with recurrent atrial fib with RVR. She was treated with IV Cardizem and IV amiodarone. She converted to sinus yesterday afternoon. CHADSVASC  score 7. Will continue metoprolol and will change amiodarone to po today.   2. Chronic diastolic CHF: Echo May 2017 with normal LV systolic function but severe LVH. Volume status is ok. Will home dose Lasix (80 mg po BID).    3. CAD/Troponin elevation: Pt with no chest pain. EKG with LBBB, old. Mild elevation troponin with flat trend, likely from demand ischemia due to rapid ventricular rate. She has known CAD. No plans for ischemic testing at this time.   Will convert amiodarone to po today. Transfer to tele. Ambulate. Likely home tomorrow if stable.   Verne Carrowhristopher McAlhany  7/5/20177:30 AM

## 2016-02-04 NOTE — Telephone Encounter (Signed)
Macario CarlsYes.   Adham Johnson, MD Internal Medicine Resident, PGY [facility] Pager: [phone]

## 2016-02-05 ENCOUNTER — Other Ambulatory Visit: Payer: Self-pay

## 2016-02-05 LAB — URINALYSIS, ROUTINE W REFLEX MICROSCOPIC
BILIRUBIN URINE: NEGATIVE
Glucose, UA: NEGATIVE mg/dL
Ketones, ur: NEGATIVE mg/dL
NITRITE: NEGATIVE
PROTEIN: NEGATIVE mg/dL
SPECIFIC GRAVITY, URINE: 1.017 (ref 1.005–1.030)
pH: 5 (ref 5.0–8.0)

## 2016-02-05 LAB — BASIC METABOLIC PANEL
ANION GAP: 11 (ref 5–15)
BUN: 24 mg/dL — ABNORMAL HIGH (ref 6–20)
CALCIUM: 9.6 mg/dL (ref 8.9–10.3)
CO2: 26 mmol/L (ref 22–32)
Chloride: 99 mmol/L — ABNORMAL LOW (ref 101–111)
Creatinine, Ser: 1.45 mg/dL — ABNORMAL HIGH (ref 0.44–1.00)
GFR calc Af Amer: 38 mL/min — ABNORMAL LOW (ref >60)
GFR, EST NON AFRICAN AMERICAN: 33 mL/min — AB (ref >60)
GLUCOSE: 100 mg/dL — AB (ref 65–99)
Potassium: 3.5 mmol/L (ref 3.5–5.1)
SODIUM: 136 mmol/L (ref 135–145)

## 2016-02-05 LAB — URINE MICROSCOPIC-ADD ON: RBC / HPF: NONE SEEN RBC/hpf (ref 0–5)

## 2016-02-05 MED ORDER — FAMOTIDINE 20 MG PO TABS
20.0000 mg | ORAL_TABLET | Freq: Every day | ORAL | Status: DC
  Administered 2016-02-06: 20 mg via ORAL
  Filled 2016-02-05: qty 1

## 2016-02-05 NOTE — Consult Note (Signed)
   [facility] CM Inpatient Consult   02/05/2016  NOMI RUDNICKI 06-Jan-1932 [id number]  Patient is currently active [up to admission] with [facility] for chronic disease management services.  Patient has been engaged by a [employer].    Our community based plan of care has focused on disease management and community resource support.  Patient was admitted with  Atrial fib with RVR and Chronic diastolic HF.  Met with patient at bedside, HIPAA verified with name, DOB, address but states her apartment is A. Patient endorses ongoing community care management services with [facility]. States she was in so much distress and when she got relief she states, "I just cried because I started feeling so much better."   Active consent on file.  Patient will receive a post discharge transition of care call and will be evaluated for monthly home visits for assessments and disease process education.  Made Inpatient Case Manager aware that [facility] following. Of note, [facility] Care Management services does not replace or interfere with any services that are needed or arranged by inpatient case management or social work.  For additional questions or referrals please contact:  Natividad Brood, RN BSN [facility] Liaison  [phone] business mobile phone Toll free office [phone]

## 2016-02-05 NOTE — Patient Outreach (Signed)
Telephone call to patient to assess for community care coordination needs. Female answered telephone, identified himself as her grandson, Vicente SereneGabriel.  Vicente SereneGabriel informed this RNCM [facility].   This RNCM informed [facility] clinical progression.    Plan: Re-engagement after discharge for community care coordinator

## 2016-02-05 NOTE — Progress Notes (Signed)
Pt a/o, no c/o pain, VSS, pt stable 

## 2016-02-05 NOTE — Progress Notes (Signed)
     SUBJECTIVE: Feeling better this am. No chest pain or dyspnea.   Tele: sinus  BP 124/42 mmHg  Pulse 74  Temp(Src) 97.8 F (36.6 C) (Oral)  Resp 16  Ht 5\' 3"  (1.6 m)  Wt 157 lb 9.6 oz (71.487 kg)  BMI 27.92 kg/m2  SpO2 96%  Intake/Output Summary (Last 24 hours) at 02/05/16 0933 Last data filed at 02/04/16 2300  Gross per 24 hour  Intake    240 ml  Output    300 ml  Net    -60 ml    PHYSICAL EXAM General: Well developed, well nourished, in no acute distress. Alert and oriented x 3.  Psych:  Good affect, responds appropriately Neck: No JVD. No masses noted.  Lungs: Clear bilaterally with no wheezes or rhonci noted.  Heart: RRR with no murmurs noted. Abdomen: Bowel sounds are present. Soft, non-tender.  Extremities: No lower extremity edema.   LABS: Basic Metabolic Panel:  Recent Labs  16/05/9606/04/17 1023 02/04/16 0125 02/05/16 0400  NA 134* 132* 136  K 4.1 4.2 3.5  CL 100* 98* 99*  CO2 23 23 26   GLUCOSE 197* 152* 100*  BUN 21* 24* 24*  CREATININE 1.23* 1.47* 1.45*  CALCIUM 10.5* 9.6 9.6  MG 2.1  --   --    CBC:  Recent Labs  02/03/16 1023  WBC 9.6  HGB 12.9  HCT 39.9  MCV 90.5  PLT 244   Cardiac Enzymes:  Recent Labs  02/03/16 1341 02/03/16 1832 02/04/16 0124  TROPONINI 0.20* 0.23* 0.20*    Current Meds: . allopurinol  100 mg Oral QHS  . amiodarone  400 mg Oral BID  . antiseptic oral rinse  7 mL Mouth Rinse BID  . apixaban  5 mg Oral BID  . atorvastatin  10 mg Oral q1800  . Chlorhexidine Gluconate Cloth  6 each Topical Q0600  . cinacalcet  30 mg Oral Q lunch  . famotidine  20 mg Oral BID  . furosemide  80 mg Oral BID  . metoprolol  50 mg Oral Daily  . mupirocin ointment  1 application Nasal BID  . mycophenolate  500 mg Oral BID  . tacrolimus  1.5 mg Oral BID     ASSESSMENT AND PLAN:  1. Atrial fib with RVR: Pt with known paroxysmal atrial fibrillation on Eliquis admitted 02/03/16 by Dr. Eden EmmsNishan with recurrent atrial fib with RVR. She  was treated with IV Cardizem and IV amiodarone. She converted to sinus 02/03/16. CHADSVASC score 7. Continue Eliquis. Will continue metoprolol and po amiodarone.    2. Chronic diastolic CHF: Echo May 2017 with normal LV systolic function but severe LVH. Volume status is ok. Will continue home dose Lasix (80 mg po BID).   3. CAD/Troponin elevation: Pt with no chest pain. EKG with LBBB, old. Mild elevation troponin with flat trend, likely from demand ischemia due to rapid ventricular rate. She has known CAD. No plans for ischemic testing at this time.    Dispo: She is ready for discharge home today but she states that she cannot go home due to transportation issues. I have asked her to work on this. If she cannot arrange transportation home, will have to delay discharge until tomorrow. After discharge home, will need follow up in 1-2 weeks with Dr[facility].   Verne CarrowChristopher McAlhany  7/6/20179:33 AM

## 2016-02-06 ENCOUNTER — Telehealth: Payer: Self-pay

## 2016-02-06 MED ORDER — AMIODARONE HCL 200 MG PO TABS
200.0000 mg | ORAL_TABLET | Freq: Two times a day (BID) | ORAL | Status: DC
  Administered 2016-02-06: 200 mg via ORAL
  Filled 2016-02-06: qty 1

## 2016-02-06 MED ORDER — AMIODARONE HCL 200 MG PO TABS: 200.0000 mg | ORAL_TABLET | Freq: Two times a day (BID) | ORAL | Status: DC

## 2016-02-06 NOTE — Progress Notes (Signed)
Pt has orders to be discharged. Discharge instructions given and pt has no additional questions at this time. Medication regimen reviewed and pt educated. Pt verbalized understanding and has no additional questions. Telemetry box removed. IV removed and site in good condition. Pt stable and waiting for transportation. 

## 2016-02-06 NOTE — Progress Notes (Signed)
     SUBJECTIVE: No chest pain or dyspnea.   Tele: sinus  BP 125/52 mmHg  Pulse 71  Temp(Src) 97.9 F (36.6 C) (Oral)  Resp 16  Ht 5\' 3"  (1.6 m)  Wt 156 lb 9.6 oz (71.033 kg)  BMI 27.75 kg/m2  SpO2 100%  Intake/Output Summary (Last 24 hours) at 02/06/16 0755 Last data filed at 02/06/16 0535  Gross per 24 hour  Intake    480 ml  Output    425 ml  Net     55 ml    PHYSICAL EXAM General: Well developed, well nourished, in no acute distress. Alert and oriented x 3.  Psych:  Good affect, responds appropriately Neck: No JVD. No masses noted.  Lungs: Clear bilaterally with no wheezes or rhonci noted.  Heart: RRR with no murmurs noted. Abdomen: Bowel sounds are present. Soft, non-tender.  Extremities: No lower extremity edema.   LABS: Basic Metabolic Panel:  Recent Labs  16/05/9606/04/17 1023 02/04/16 0125 02/05/16 0400  NA 134* 132* 136  K 4.1 4.2 3.5  CL 100* 98* 99*  CO2 23 23 26   GLUCOSE 197* 152* 100*  BUN 21* 24* 24*  CREATININE 1.23* 1.47* 1.45*  CALCIUM 10.5* 9.6 9.6  MG 2.1  --   --    CBC:  Recent Labs  02/03/16 1023  WBC 9.6  HGB 12.9  HCT 39.9  MCV 90.5  PLT 244   Cardiac Enzymes:  Recent Labs  02/03/16 1341 02/03/16 1832 02/04/16 0124  TROPONINI 0.20* 0.23* 0.20*   Current Meds: . allopurinol  100 mg Oral QHS  . amiodarone  200 mg Oral BID  . antiseptic oral rinse  7 mL Mouth Rinse BID  . apixaban  5 mg Oral BID  . atorvastatin  10 mg Oral q1800  . Chlorhexidine Gluconate Cloth  6 each Topical Q0600  . cinacalcet  30 mg Oral Q lunch  . famotidine  20 mg Oral Daily  . furosemide  80 mg Oral BID  . metoprolol  50 mg Oral Daily  . mupirocin ointment  1 application Nasal BID  . mycophenolate  500 mg Oral BID  . tacrolimus  1.5 mg Oral BID     ASSESSMENT AND PLAN:   1. Atrial fib with RVR: Pt with known paroxysmal atrial fibrillation on Eliquis admitted 02/03/16 by Dr. Eden EmmsNishan with recurrent atrial fib with RVR. She was treated with IV  Cardizem and IV amiodarone. She converted to sinus 02/03/16. CHADSVASC score 7. Continue Eliquis. Will continue metoprolol and po amiodarone.Will use amiodarone 200 mg po BID for 2 weeks then 200 mg daily.  The only new medication is amiodarone.   2. Chronic diastolic CHF: Volume status is ok. Echo May 2017 with normal LV systolic function but severe LVH. Volume status is ok. Will continue home dose Lasix (80 mg po BID).   3. CAD/Troponin elevation: Pt with no chest pain. EKG with LBBB, old. Mild elevation troponin with flat trend, likely from demand ischemia due to rapid ventricular rate. She has known CAD. No plans for ischemic testing at this time.   Dispo: Will d/c home today. She will need follow up in 1-2 weeks with Dr. Rennis GoldenHilty or the office APP in [facility]. New med is amiodarone.   [name]  7/7/20177:55 AM

## 2016-02-06 NOTE — Telephone Encounter (Signed)
Called patient's # and left message that paperwork was ready to be picked up at front desk

## 2016-02-06 NOTE — Telephone Encounter (Signed)
Verbal order given to conduct assessment after hospital discharge. Do you agree?

## 2016-02-06 NOTE — Telephone Encounter (Signed)
Marcelino DusterMichelle from [facility] requesting the nurse to call back regarding VO.

## 2016-02-06 NOTE — Telephone Encounter (Signed)
Yes

## 2016-02-06 NOTE — Discharge Summary (Signed)
Discharge Summary    Patient ID: [name],  MRN: [id number], DOB/AGE: [birth year] 80 y.o.  Admit date: 02/03/2016 Discharge date: 02/06/2016  Primary Care Provider: Gara Kronerruong, Diana Primary Cardiologist: Dr. Rennis GoldenHilty  Discharge Diagnoses    Active Problems:   S/P kidney transplant   CAD S/P remote RCA PCI   PAF (paroxysmal atrial fibrillation) (HCC)   Atrial fibrillation with RVR (HCC)   Allergies Allergies  Allergen Reactions  . Sulfa Drugs Cross Reactors Swelling    Diagnostic Studies/Procedures  None _____________   History of Present Illness   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  She was recently admitted from 12/18/15-01/01/16 with community - acquired pneumococcal pneumonia that was complicated by atrial fibrillation with RVR and non-STEMI. Cardiac catheterization was considered but her creatinine increased and given the history of her prior renal transplant. it was felt that cardiac catheterization should be avoided.   She did have a low risk nuclear study during that admission. She was started on Eliquis for anticoagulation. Also of note during that admission she had right pleural effusion and required thoracentesis 2. She follows with pulmonology for this.  She presented to the ED on 02/03/2016 with 4 days of weakness, dyspnea and fatigue. She was found to be in A. fib with rapid ventricular response with rates as fast as 180. Given that she had been compliant with her Eliquis and had not missed any doses, the option to cardiovert her was presented but the patient declined stating that she was too scared. So she was started on IV amiodarone and IV Cardizem.  Hospital Course  She was admitted on IV amiodarone and IV Cardizem, and kept nothing by mouth for possible cardioversion the next day if she continued to be in rapid atrial  fibrillation. Her chest x-ray showed bilateral pleural effusions, she was given Lasix.  She converted to sinus rhythm later that day. IV Cardizem was discontinued and her amiodarone was changed to by mouth. Her echo from May 2017 was reviewed she had normal LV systolic function but severe LVH, her home dose of Lasix 80 mg by mouth twice a day was continued. Her troponin was mildly elevated with flat trend felt to be likely from demand ischemia due to rapid ventricular rate. No plans for ischemic testing at this time.  She will continue po Amiodarone at home, will continue metoprolol. She was seen today by Dr. Clifton JamesMcAlhany and deemed suitable for discharge.  _____________  Discharge Vitals Blood pressure 125/52, pulse 71, temperature 97.9 F (36.6 C), temperature source Oral, resp. rate 16, height 5\' 3"  (1.6 m), weight 156 lb 9.6 oz (71.033 kg), SpO2 100 %.  Filed Weights   02/04/16 1322 02/05/16 0443 02/06/16 0534  Weight: 158 lb 8 oz (71.895 kg) 157 lb 9.6 oz (71.487 kg) 156 lb 9.6 oz (71.033 kg)    Labs & Radiologic Studies    Basic Metabolic Panel  Recent Labs  02/04/16 0125 02/05/16 0400  NA 132* 136  K 4.2 3.5  CL 98* 99*  CO2 23 26  GLUCOSE 152* 100*  BUN 24* 24*  CREATININE 1.47* 1.45*  CALCIUM 9.6 9.6   Cardiac Enzymes  Recent Labs  02/03/16 1341 02/03/16 1832 02/04/16 0124  TROPONINI 0.20* 0.23* 0.20*    Dg Chest 2 View  01/12/2016  CLINICAL  DATA:  Followup pneumonia and pleural effusion, dyspnea on exertion, history hypertension, chronic diastolic CHF, chronic kidney disease, type II diabetes mellitus EXAM: CHEST  2 VIEW COMPARISON:  12/31/2015 FINDINGS: Enlargement of cardiac silhouette with pulmonary vascular congestion. Atherosclerotic calcification aorta. Moderate RIGHT pleural effusion increased since previous exam. Increased RIGHT basilar atelectasis involving RIGHT middle and RIGHT lower lobes. Underlying emphysematous changes. Minimal subsegmental atelectasis  lingula. No definite acute infiltrate or pneumothorax. Bones demineralized. IMPRESSION: Enlargement of cardiac silhouette with pulmonary vascular congestion. COPD changes with increased RIGHT pleural effusion and basilar atelectasis since prior study. Electronically Signed   By: Ulyses Southward [name]   On: 01/12/2016 15:16   Dg Chest Portable 1 View  02/03/2016  CLINICAL DATA:  Chest pain EXAM: PORTABLE CHEST 1 VIEW COMPARISON:  01/16/2016 FINDINGS: Cardiomegaly again noted. There is small to moderate right pleural effusion with right lower lobe atelectasis or infiltrate. Small amount of fluid right minor fissure. No pulmonary edema. Atherosclerotic calcifications thoracic aorta. Degenerative changes bilateral shoulders. IMPRESSION: No pulmonary edema. Cardiomegaly. Small to moderate right pleural effusion with right lower lobe atelectasis or infiltrate. Electronically Signed   By: Natasha Mead [name]   On: 02/03/2016 10:33   Dg Chest Port 1 View  01/16/2016  CLINICAL DATA:  Status post right-sided thoracentesis EXAM: PORTABLE CHEST 1 VIEW COMPARISON:  January 12, 2016 FINDINGS: Right-sided pleural effusion is smaller following thoracentesis. No pneumothorax evident. There is moderate residual pleural effusion on the right with right base consolidation. The left lung is clear except for minimal left base atelectasis. Heart is borderline enlarged with pulmonary vascularity within normal limits. No adenopathy. No blastic or lytic bone lesions are evident. IMPRESSION: Right pleural effusion is smaller following thoracentesis. Moderate right effusion with right base consolidation does remain. Minimal left base atelectasis present; left lung otherwise clear. Stable cardiac prominence. Electronically Signed   By: Bretta Bang III [name]   On: 01/16/2016 13:10    Disposition   Pt is being discharged home today in good condition.  Follow-up Plans & Appointments    Follow-up Information    Follow up with Chrystie Nose, MD On 03/01/2016.   Specialty:  Cardiology   Why:  at 2:30am for follow up    Contact information:   [address] [place] [address] [phone]      Discharge Instructions    Diet - low sodium heart healthy    Complete by:  As directed      Increase activity slowly    Complete by:  As directed            Discharge Medications   Current Discharge Medication List    START taking these medications   Details  amiodarone (PACERONE) 200 MG tablet Take 1 tablet (200 mg total) by mouth 2 (two) times daily. Qty: 60 tablet, Refills: 12      CONTINUE these medications which have NOT CHANGED   Details  allopurinol (ZYLOPRIM) 100 MG tablet Take 100 mg by mouth at bedtime.    amLODipine (NORVASC) 10 MG tablet Take 5 mg by mouth daily.    apixaban (ELIQUIS) 2.5 MG TABS tablet Take 1 tablet (2.5 mg total) by mouth 2 (two) times daily. Qty: 180 tablet, Refills: 3    aspirin EC 81 MG tablet Take 81 mg by mouth daily.    atorvastatin (LIPITOR) 10 MG tablet Take 1 tablet (10 mg total) by mouth daily at 6 PM. Qty: 30 tablet, Refills: 1  cinacalcet (SENSIPAR) 90 MG tablet Take 30 mg by mouth daily.     colchicine 0.6 MG tablet Take 0.6 mg by mouth daily as needed (FOR GOUT). Reported on 01/21/2016    famotidine (PEPCID) 20 MG tablet Take 20 mg by mouth 2 (two) times daily.    furosemide (LASIX) 80 MG tablet Take 80 mg by mouth 2 (two) times daily.    metoprolol (LOPRESSOR) 50 MG tablet Take 50 mg by mouth daily.     mycophenolate (CELLCEPT) 250 MG capsule Take 500 mg by mouth 2 (two) times daily.    tacrolimus (PROGRAF) 0.5 MG capsule 1 mg in the morning and 1 mg at bedtime Qty: 120 capsule, Refills: 0           Outstanding Labs/Studies    Duration of Discharge Encounter   Greater than 30 minutes including physician time.  Signed, Little IshikawaErin E Carlyle Mcelrath NP 02/06/2016, 11:33 AM

## 2016-02-09 ENCOUNTER — Other Ambulatory Visit: Payer: Self-pay

## 2016-02-09 DIAGNOSIS — I130 Hypertensive heart and chronic kidney disease with heart failure and stage 1 through stage 4 chronic kidney disease, or unspecified chronic kidney disease: Secondary | ICD-10-CM | POA: Diagnosis not present

## 2016-02-09 DIAGNOSIS — I4891 Unspecified atrial fibrillation: Secondary | ICD-10-CM | POA: Diagnosis not present

## 2016-02-09 DIAGNOSIS — Z8701 Personal history of pneumonia (recurrent): Secondary | ICD-10-CM | POA: Diagnosis not present

## 2016-02-09 DIAGNOSIS — N183 Chronic kidney disease, stage 3 (moderate): Secondary | ICD-10-CM | POA: Diagnosis not present

## 2016-02-09 DIAGNOSIS — I2510 Atherosclerotic heart disease of native coronary artery without angina pectoris: Secondary | ICD-10-CM | POA: Diagnosis not present

## 2016-02-09 DIAGNOSIS — I5033 Acute on chronic diastolic (congestive) heart failure: Secondary | ICD-10-CM | POA: Diagnosis not present

## 2016-02-09 NOTE — Patient Outreach (Signed)
Initial telephone contact post acute care discharge for assessment of community care coordination needs. Patient identified herself by providing date of birth and address.  Patient endorses being admitted to acute care most recently due to an irregular heart beat. patient and this RNCM collaborated to re-evaluate her case management care plan, made revisions and updated accordingly.  Patient and this RNCM agreed to home visit next week for atrial fibrillation education

## 2016-02-12 ENCOUNTER — Ambulatory Visit (INDEPENDENT_AMBULATORY_CARE_PROVIDER_SITE_OTHER): Payer: Medicare Other | Admitting: Acute Care

## 2016-02-12 ENCOUNTER — Encounter: Payer: Self-pay | Admitting: Acute Care

## 2016-02-12 ENCOUNTER — Ambulatory Visit (INDEPENDENT_AMBULATORY_CARE_PROVIDER_SITE_OTHER)
Admission: RE | Admit: 2016-02-12 | Discharge: 2016-02-12 | Disposition: A | Discharge status: Home / Self Care | Payer: Medicare Other | Source: Ambulatory Visit | Attending: Acute Care | Admitting: Acute Care

## 2016-02-12 ENCOUNTER — Other Ambulatory Visit: Payer: Self-pay | Admitting: *Deleted

## 2016-02-12 VITALS — BP 132/82 | HR 70 | Ht 63.0 in | Wt 156.0 lb

## 2016-02-12 DIAGNOSIS — E663 Overweight: Secondary | ICD-10-CM | POA: Diagnosis not present

## 2016-02-12 DIAGNOSIS — I10 Essential (primary) hypertension: Secondary | ICD-10-CM | POA: Diagnosis not present

## 2016-02-12 DIAGNOSIS — E212 Other hyperparathyroidism: Secondary | ICD-10-CM | POA: Diagnosis not present

## 2016-02-12 DIAGNOSIS — J13 Pneumonia due to Streptococcus pneumoniae: Secondary | ICD-10-CM

## 2016-02-12 DIAGNOSIS — R938 Abnormal findings on diagnostic imaging of other specified body structures: Secondary | ICD-10-CM | POA: Diagnosis not present

## 2016-02-12 DIAGNOSIS — Z9861 Coronary angioplasty status: Secondary | ICD-10-CM | POA: Diagnosis not present

## 2016-02-12 DIAGNOSIS — J90 Pleural effusion, not elsewhere classified: Secondary | ICD-10-CM | POA: Diagnosis not present

## 2016-02-12 DIAGNOSIS — I2510 Atherosclerotic heart disease of native coronary artery without angina pectoris: Secondary | ICD-10-CM

## 2016-02-12 DIAGNOSIS — R9389 Abnormal findings on diagnostic imaging of other specified body structures: Secondary | ICD-10-CM

## 2016-02-12 DIAGNOSIS — Z940 Kidney transplant status: Secondary | ICD-10-CM | POA: Diagnosis not present

## 2016-02-12 DIAGNOSIS — N183 Chronic kidney disease, stage 3 (moderate): Secondary | ICD-10-CM | POA: Diagnosis not present

## 2016-02-12 MED ORDER — AMOXICILLIN-POT CLAVULANATE 875-125 MG PO TABS: 1.0000 | ORAL_TABLET | Freq: Two times a day (BID) | ORAL | Status: DC

## 2016-02-12 NOTE — Assessment & Plan Note (Signed)
Slow to resolve bilateral infiltrates  Slow decrease in volume of right sided pleural effusion Purulent Secretions Plan: We will send in a prescription for Augmentin today. Take one tablet twice daily for 7 days. Use probiotics while on antibiotics Please try saline mist for your nasal dryness. . [facility] 3  [facility] or myself for follow up CXR. Please contact office for sooner follow up if symptoms do not improve or worsen or seek emergency care  We will reassess the oxygen use at that time depending on how much shortness of breath you are having.

## 2016-02-12 NOTE — Patient Instructions (Addendum)
It is nice to meet you today. We will send in a prescription for Augmentin today. Take one tablet twice daily for 7 days. Use probiotics while on antibiotics Please try saline mist for your nasal dryness. . Follow up in 3  weeks with Dr. Sherene SiresWert or myself for follow up CXR. Please contact office for sooner follow up if symptoms do not improve or worsen or seek emergency care  We will reassess the oxygen use at that time depending on how much shortness of breath you are having.

## 2016-02-12 NOTE — Assessment & Plan Note (Signed)
Resolving right pleural effusion Continued atelectasis/ infiltrates bibasilar Plan: Augmentin x 1 week CXR in 3 weeks May need repeat CT/ referral to [facility]  if continues to re-accumulate

## 2016-02-12 NOTE — Progress Notes (Signed)
Chart and office note reviewed in detail  > agree with a/p as outlined    

## 2016-02-12 NOTE — Progress Notes (Signed)
History of Present Illness [name] is a 82 y.o. female former smoker with seen for pulmonary consult during hospitalization May 2017 with a pneumococcal pneumonia with associated bacteremia and acute hypoxic respiratory failure. She has stage II chronic kidney disease status post renal transplant, diabetes, chronic diastolic congestive heart failure and atrial fib with RVR, on Eliquis.   02/12/2016 Pneumonia/Pleural Effusion Follow Up:   Pt. Presents today for follow up for pleural effusion and pneumonia diagnosed  12/18/2015. Patient was admitted May 2017 for pneumococcal pneumonia with associated bacteremia and acute hypoxic respiratory failure. Hospitalization was compensated by atrial filb with RVR and NSTEMI demand ischemia. She was seen for pulmonary consult during this hospitalization. She was found to have a large right exudative pleural effusion-felt to be parapneumonic. She underwent thoracentesis times 2 with 1460 cc and then 800cc . Cytology neg for malignant cells.  Initial CT chest on May 23 showed a large right pleural effusion with right lower lobe collapse Repeat CT chest on May 31 showed complete atelectasis of the right lower lobe with significant atelectasis of the right middle lobe. The proximal right middle lobe bronchus appears occluded. Small to moderate right pleural effusion is decreased from previous CT scan. She was treated with antibiotics and diuretics were adjusted. . 2 D echo showed w/ EF 55%, mod LA dilation, mod TV regurg, PAP 44.  She was recently hospitalized July 4th for atrial fib with RVR. She did not want cardioversion. She has been compliant with her Eliquis. CXR today indicates continued atelectasis or infiltrate. Pt. States she continues to have shortness of  breath with activity.She is using her oxygen at 2L [place] 24/7. She is interested in weaning off the oxygen, but I told her that since she continues to have dyspnea, we need to continue oxygen therapy  until she is better.She states she continues to cough up green secretions with some blood. She states she has been coughing up blood since before her first hospital admission. She denies fever, chest pain, or orthopnea, leg or calf pain or tenderness.   Significant Events: 12/19/2015>>2 D echo showed w/ EF 55%, mod LA dilation, mod TV regurg, PAP 44.  12/23/15>> CT scan on 5/23 showed showed a large right pleural effusion with right lower lobe collapse and patchy airspace disease of the left lower lobe, finding suspicion for aspiration pneumonia, endobronchial lesion causing collapse of the right lower lobe is also in the differential diagnosis    12/24/15>> Thorocentesis>> 1640 cc clear yellow, most likely exudative by LDH. ( 10 days IV Rocephin) 12/28/15>> Reaccumulation>> parapneumonic effusion and had another 836mL's of pleural fluid removed( ABX resumed) 12/31/15>> Repeat CT scan of the chest without contrast on 5/31 showed no convincing right lower lobe mass, small to moderate right pleural effusion. Pulmonary recommended aggressive diuresis to help manage her pleural effusion Patient is also being sent home on another 5 days of Augmentin. 01/16/16>>Thorocentesis>> 1 Liter + amber yellow pleural fluid  02/12/2016: IMPRESSION: Interval decrease in the volume of the right-sided pleural effusion. Persistent bibasilar atelectasis or pneumonia.  Aortic atherosclerosis.   Past medical hx Past Medical History  Diagnosis Date  . S/P kidney transplant     a. due to FSGN, follows with Dr. Vivia Birmingham in 2003. Was on HD prior to that.  . Coronary artery disease     a. 01/2001 Cath/PCI: LAD 50p, 68m, D1 50-60, RCA 5m (3.0x13 BX Velocity BMS).// b. NSTEMI 5/17 (demand ischemia) in setting of AF with RVR, pneumonia,  CKD, a/c HF >> Myoview 12/26/15: prob inf infarct, no ischemia, EF 52%, Low Risk >> med Rx  . Hypertensive heart disease   . Cholelithiasis   . Chronic diastolic CHF  (congestive heart failure) (HCC)     a. 06/2011 Echo: EF 60-65%, no rwma, Gr1 DD, mild TR. // b. Echo 12/19/15: Severe LVH, EF 55-60%, normal wall motion, mild MR, moderate LAE, moderate TR, PASP 44 mmHg  . Gout     unconfirmed by joint aspiration  . CKD (chronic kidney disease), stage III     a. in setting of prior ESRD and cadaveric tx in 2003.  . Candidal esophagitis (HCC)     a. 03/2014.  Marland Kitchen. Gastritis     a. 03/2014  . Type II diabetes mellitus (HCC)   . History of bacteremia     a. 08/2004 Group A Strep bacteremia.     Past surgical hx, Family hx, Social hx all reviewed.  Current Outpatient Prescriptions on File Prior to Visit  Medication Sig  . allopurinol (ZYLOPRIM) 100 MG tablet Take 100 mg by mouth at bedtime.  Marland Kitchen. amiodarone (PACERONE) 200 MG tablet Take 1 tablet (200 mg total) by mouth 2 (two) times daily.  Marland Kitchen. apixaban (ELIQUIS) 2.5 MG TABS tablet Take 1 tablet (2.5 mg total) by mouth 2 (two) times daily.  Marland Kitchen. aspirin EC 81 MG tablet Take 81 mg by mouth daily.  . cinacalcet (SENSIPAR) 90 MG tablet Take 30 mg by mouth daily.   . colchicine 0.6 MG tablet Take 0.6 mg by mouth daily as needed (FOR GOUT). Reported on 01/21/2016  . famotidine (PEPCID) 20 MG tablet Take 20 mg by mouth 2 (two) times daily.  . furosemide (LASIX) 80 MG tablet Take 80 mg by mouth 2 (two) times daily.  . metoprolol (LOPRESSOR) 50 MG tablet Take 50 mg by mouth daily.   . mycophenolate (CELLCEPT) 250 MG capsule Take 500 mg by mouth 2 (two) times daily.  . tacrolimus (PROGRAF) 0.5 MG capsule 1 mg in the morning and 1 mg at bedtime (Patient taking differently: Take 1.5 mg by mouth 2 (two) times daily. )   No current facility-administered medications on file prior to visit.     Allergies  Allergen Reactions  . Sulfa Drugs Cross Reactors Swelling    Review Of Systems:  Constitutional:   No  weight loss, night sweats,  Fevers, chills, fatigue, or  lassitude.  HEENT:   No headaches,  Difficulty swallowing,   Tooth/dental problems, or  Sore throat,                No sneezing, itching, ear ache, nasal congestion, post nasal drip,   CV:  No chest pain,  Orthopnea, PND, swelling in lower extremities, anasarca, dizziness, palpitations, syncope.   GI  No heartburn, indigestion, abdominal pain, nausea, vomiting, diarrhea, change in bowel habits, loss of appetite, bloody stools.   Resp: + shortness of breath with exertion less at rest.  + excess mucus, + productive cough,  No non-productive cough,  + coughing up of scant  blood.  + change in color of mucus.  No wheezing.  No chest wall deformity  Skin: no rash or lesions.  GU: no dysuria, change in color of urine, no urgency or frequency.  No flank pain, no hematuria   MS:  No joint pain or swelling.  No decreased range of motion.  No back pain.  Psych:  No change in mood or affect. No depression or anxiety.  No memory loss.   Vital Signs BP 132/82 mmHg  Pulse 70  Ht 5\' 3"  (1.6 m)  Wt 156 lb (70.761 kg)  BMI 27.64 kg/m2  SpO2 99%   Physical Exam:  General- No distress,  A&Ox3, frail elderly female in a wheel chair. ENT: No sinus tenderness, TM clear, pale nasal mucosa, no oral exudate,no post nasal drip, no LAN Cardiac: S1, S2, regular rate and rhythm, no murmur Chest: No wheeze/ rales/ diminished per bases bilaterally; no accessory muscle use, no nasal flaring, no sternal retractions Abd.: Soft Non-tender Ext: No clubbing cyanosis, edema Neuro:  normal strength Skin: No rashes, warm and dry Psych: normal mood and behavior   Assessment/Plan  Pneumococcal pneumonia (HCC) Slow to resolve bilateral infiltrates  Slow decrease in volume of right sided pleural effusion Purulent Secretions Plan: We will send in a prescription for Augmentin today. Take one tablet twice daily for 7 days. Use probiotics while on antibiotics Please try saline mist for your nasal dryness. . [facility] 3  [facility] or myself for follow up CXR. Please contact office for sooner follow up if symptoms do not improve or worsen or seek emergency care  We will reassess the oxygen use at that time depending on how much shortness of breath you are having.    Pleural effusion, bilateral Resolving right pleural effusion Continued atelectasis/ infiltrates bibasilar Plan: Augmentin x 1 week CXR in 3 weeks May need repeat CT/ referral to [facility]  if continues to re-accumulate     Bevelyn Ngo, NP 02/12/2016  4:49 PM

## 2016-02-13 DIAGNOSIS — I4891 Unspecified atrial fibrillation: Secondary | ICD-10-CM | POA: Diagnosis not present

## 2016-02-13 DIAGNOSIS — I2510 Atherosclerotic heart disease of native coronary artery without angina pectoris: Secondary | ICD-10-CM | POA: Diagnosis not present

## 2016-02-13 DIAGNOSIS — N183 Chronic kidney disease, stage 3 (moderate): Secondary | ICD-10-CM | POA: Diagnosis not present

## 2016-02-13 DIAGNOSIS — Z8701 Personal history of pneumonia (recurrent): Secondary | ICD-10-CM | POA: Diagnosis not present

## 2016-02-13 DIAGNOSIS — I5033 Acute on chronic diastolic (congestive) heart failure: Secondary | ICD-10-CM | POA: Diagnosis not present

## 2016-02-13 DIAGNOSIS — I130 Hypertensive heart and chronic kidney disease with heart failure and stage 1 through stage 4 chronic kidney disease, or unspecified chronic kidney disease: Secondary | ICD-10-CM | POA: Diagnosis not present

## 2016-02-16 ENCOUNTER — Telehealth: Payer: Self-pay | Admitting: Physician Assistant

## 2016-02-16 DIAGNOSIS — N183 Chronic kidney disease, stage 3 unspecified: Secondary | ICD-10-CM

## 2016-02-16 DIAGNOSIS — I480 Paroxysmal atrial fibrillation: Secondary | ICD-10-CM

## 2016-02-16 NOTE — Telephone Encounter (Signed)
LM for patient to call back.

## 2016-02-16 NOTE — Telephone Encounter (Signed)
Please tell patient I reviewed her case with Dr. K. [place]had Hilty  Her dose of Eliquis should be 5 mg bid Please increase dose of Eliquis to 5 mg bid BMET, CBC at [facility]U OV with Dr. K. [place]had Hilty later this month. Tereso NewcomerScott Messina Kosinski, PA-C   02/16/2016 5:18 PM

## 2016-02-17 ENCOUNTER — Other Ambulatory Visit: Payer: Self-pay

## 2016-02-17 ENCOUNTER — Ambulatory Visit: Payer: Medicare Other

## 2016-02-17 ENCOUNTER — Telehealth: Payer: Self-pay | Admitting: Physician Assistant

## 2016-02-17 DIAGNOSIS — Z8701 Personal history of pneumonia (recurrent): Secondary | ICD-10-CM | POA: Diagnosis not present

## 2016-02-17 DIAGNOSIS — I5033 Acute on chronic diastolic (congestive) heart failure: Secondary | ICD-10-CM | POA: Diagnosis not present

## 2016-02-17 DIAGNOSIS — I4891 Unspecified atrial fibrillation: Secondary | ICD-10-CM | POA: Diagnosis not present

## 2016-02-17 DIAGNOSIS — N183 Chronic kidney disease, stage 3 (moderate): Secondary | ICD-10-CM | POA: Diagnosis not present

## 2016-02-17 DIAGNOSIS — I130 Hypertensive heart and chronic kidney disease with heart failure and stage 1 through stage 4 chronic kidney disease, or unspecified chronic kidney disease: Secondary | ICD-10-CM | POA: Diagnosis not present

## 2016-02-17 DIAGNOSIS — I2510 Atherosclerotic heart disease of native coronary artery without angina pectoris: Secondary | ICD-10-CM | POA: Diagnosis not present

## 2016-02-17 MED ORDER — APIXABAN 5 MG PO TABS: 5.0000 mg | ORAL_TABLET | Freq: Two times a day (BID) | ORAL | Status: DC

## 2016-02-17 NOTE — Telephone Encounter (Signed)
Pt notified to increase Eliquis to 5 mg BID, new Rx sent in . Pt agreeable to plan of care. Advised of lab work to be done when she see's Dr. Rennis GoldenHilty 03/01/16.

## 2016-02-17 NOTE — Telephone Encounter (Signed)
Called pt to discuss cost of Eliquis and that she can change over to Coumadin. I explained to pt that she will need to have frequent INR checks in the office. Pt stated she will pick up the increased dose of Eliquis this time as discussed earlier. Pt asked were the 2 medications the same. I explained that they both help to prevent clots, though the Coumadin is cheaper the Eliquis is the better medication. I did tell pt though if she decides she wants to change to Coumadin we can do that. Pt said ok and thank you for my help.

## 2016-02-17 NOTE — Addendum Note (Signed)
Addended by: Tarri FullerFIATO, CAROL M on: 02/17/2016 11:01 AM   Modules accepted: Orders

## 2016-02-17 NOTE — Telephone Encounter (Signed)
Will route to Tereso NewcomerScott Weaver, [place] for recommendations. Pt states she cannot afford Eliquis and would like something else. Please advise.

## 2016-02-17 NOTE — Patient Outreach (Signed)
[facility] Care Management  02/17/2016  [name] June 04, 1932 [id number]   Initial home visit. Patient and this RNCM updated he case management care plan, revisions made.  Plan: Telephone contact next week for assessment.

## 2016-02-17 NOTE — Telephone Encounter (Signed)
°  Follow Up   States pt can not afford Eliquis medication. Requesting possible alternative. Please advice.

## 2016-02-17 NOTE — Telephone Encounter (Signed)
Would recommend she change to Coumadin. She would need to start Coumadin 5 mg QD. Stop Eliquis after 3rd day of Coumadin. Check INR in [facility] clinic on day 5 of Coumadin. Tereso NewcomerScott Weaver, PA-C   02/17/2016 5:09 PM

## 2016-02-17 NOTE — Telephone Encounter (Signed)
Pt notified to increase Eliquis to 5 mg BID, new Rx sent in . Pt agreeable to plan of care. Advised of lab work to be done when she see's Dr. Hilty 03/01/16. 

## 2016-02-19 DIAGNOSIS — I2510 Atherosclerotic heart disease of native coronary artery without angina pectoris: Secondary | ICD-10-CM | POA: Diagnosis not present

## 2016-02-19 DIAGNOSIS — I4891 Unspecified atrial fibrillation: Secondary | ICD-10-CM | POA: Diagnosis not present

## 2016-02-19 DIAGNOSIS — I130 Hypertensive heart and chronic kidney disease with heart failure and stage 1 through stage 4 chronic kidney disease, or unspecified chronic kidney disease: Secondary | ICD-10-CM | POA: Diagnosis not present

## 2016-02-19 DIAGNOSIS — I5033 Acute on chronic diastolic (congestive) heart failure: Secondary | ICD-10-CM | POA: Diagnosis not present

## 2016-02-19 DIAGNOSIS — N183 Chronic kidney disease, stage 3 (moderate): Secondary | ICD-10-CM | POA: Diagnosis not present

## 2016-02-19 DIAGNOSIS — Z8701 Personal history of pneumonia (recurrent): Secondary | ICD-10-CM | POA: Diagnosis not present

## 2016-02-24 ENCOUNTER — Other Ambulatory Visit: Payer: Self-pay

## 2016-02-24 NOTE — Patient Outreach (Addendum)
  Telephone contact with patient  to assess progression towards meeting her case management goals. Patient identified herself by providing date of birth and address.    Patient states she was feeling better today, reports having a day, on yesterday, when she did not feel so well. Patient describes her symptoms as just feeling a little tired.  Patient and this RNCM reviewed her care plan, updated accordingly. Patient provided information to update care plan.  Patient confirms she is continuing to use continuous oxygen which she states is good at her current flow rate of 203 liters per minutes via nasal cannula.   Patient denies palpitations or swelling.  Plan: Telephone contact next month, home visit later in the month of august.

## 2016-02-25 ENCOUNTER — Telehealth: Payer: Self-pay | Admitting: Internal Medicine

## 2016-02-25 DIAGNOSIS — I4891 Unspecified atrial fibrillation: Secondary | ICD-10-CM | POA: Diagnosis not present

## 2016-02-25 DIAGNOSIS — I2510 Atherosclerotic heart disease of native coronary artery without angina pectoris: Secondary | ICD-10-CM | POA: Diagnosis not present

## 2016-02-25 DIAGNOSIS — I130 Hypertensive heart and chronic kidney disease with heart failure and stage 1 through stage 4 chronic kidney disease, or unspecified chronic kidney disease: Secondary | ICD-10-CM | POA: Diagnosis not present

## 2016-02-25 DIAGNOSIS — N183 Chronic kidney disease, stage 3 (moderate): Secondary | ICD-10-CM | POA: Diagnosis not present

## 2016-02-25 DIAGNOSIS — I5033 Acute on chronic diastolic (congestive) heart failure: Secondary | ICD-10-CM | POA: Diagnosis not present

## 2016-02-25 DIAGNOSIS — Z8701 Personal history of pneumonia (recurrent): Secondary | ICD-10-CM | POA: Diagnosis not present

## 2016-02-25 NOTE — Telephone Encounter (Signed)
Spoke to [facility]. She notes at visit today, pt stated she had "a little pressure on her chest yesterday - no pain or issues today". Pt thinks this was gas, she did not need to take anything for it, reports no chest pain, shortness of breath, other concerns today.  Notes "no energy" today but that she has had dips in her energy level off and on for a long time, and seems to have good days and bad days. No syncope/near syncope. No BPs reported. HR in 50s today. (she has been in 50s-60s when checked in past).  Patient has upcoming visit w Dr. Rennis Golden on Monday 7/31. HHRN wanted him to be aware of concerns today and see if anything recommended at this time.  Current meds as listed. Will route to Dr. Rennis Golden for recommendations - will communicate w patient and [facility] on any dose adjustments to meds or other instructions.

## 2016-02-25 NOTE — Telephone Encounter (Signed)
New message       The home care nurse states the pt is complaining of low energy and her heart rate on the visit hr -52 and normally in the 70's   The home health nurse is really concerned about the lack of energy.

## 2016-02-26 NOTE — Telephone Encounter (Signed)
Will d/w her at her upcoming appointment on Monday.  Dr. Rexene Edison

## 2016-03-01 ENCOUNTER — Encounter: Payer: Self-pay | Admitting: Internal Medicine

## 2016-03-01 ENCOUNTER — Ambulatory Visit (INDEPENDENT_AMBULATORY_CARE_PROVIDER_SITE_OTHER): Payer: Medicare Other | Admitting: Internal Medicine

## 2016-03-01 VITALS — BP 114/69 | HR 54 | Ht 63.0 in | Wt 156.0 lb

## 2016-03-01 DIAGNOSIS — I2510 Atherosclerotic heart disease of native coronary artery without angina pectoris: Secondary | ICD-10-CM | POA: Diagnosis not present

## 2016-03-01 DIAGNOSIS — I5032 Chronic diastolic (congestive) heart failure: Secondary | ICD-10-CM

## 2016-03-01 DIAGNOSIS — Z9861 Coronary angioplasty status: Secondary | ICD-10-CM

## 2016-03-01 DIAGNOSIS — N183 Chronic kidney disease, stage 3 unspecified: Secondary | ICD-10-CM

## 2016-03-01 DIAGNOSIS — J90 Pleural effusion, not elsewhere classified: Secondary | ICD-10-CM | POA: Diagnosis not present

## 2016-03-01 MED ORDER — METOPROLOL TARTRATE 25 MG PO TABS
25.0000 mg | ORAL_TABLET | Freq: Two times a day (BID) | ORAL | Status: DC
Start: 2016-03-01 — End: 2016-06-01

## 2016-03-01 NOTE — Patient Instructions (Addendum)
Medication Instructions:  DECREASE Metoprolol to 25mg ; Take 1 tab by mouth twice a day.  Labwork: None ordered  Testing/Procedures: None Ordered  Follow-Up: Your physician recommends that you schedule a follow-up appointment in: 1 MONTH WITH DR HILTY.   Any Other Special Instructions Will Be Listed Below (If Applicable).     If you need a refill on your cardiac medications before your next appointment, please call your pharmacy.

## 2016-03-01 NOTE — Progress Notes (Signed)
OFFICE NOTE  Chief Complaint:  I just don't feel well today  Primary Care Physician: Gara Kroner, MD  HPI:  [name] is a 82 y.o. 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  Cardiac cath was done in 2002. Mid RCA 85% lesion was treated with a bare metal stent. She had moderate nonobstructive disease in LAD and diagonal treated medically. She's been seen by different cardiologists during past admissions but has never followed up in the office.   Most recently admitted 5/18-6/1 with community-acquired pneumococcal pneumonia complicated by AF with RVR and non-STEMI.  Echo demonstrated normal LV function with an EF of 55-60%. Troponin peaked at 1.26. On rate control therapy, she converted to NSR.  Pneumonia was complicated by right pleural effusion. She required thoracentesis x 2. This was likely exudative. Cytology was neg for malignancy.  Initially, cardiac catheterization was considered. Nephrology also followed the patient. Her creatinine increased but did improve prior to DC.  Given her prior renal transplant, it was felt that cardiac catheterization should be avoided. It was thought that her elevated troponin was likely related to demand ischemia.  Inpatient nuclear stress test was arranged.  This was low risk and neg for ischemia.  Med Rx was recommended.  CHADS2-VASc=7 (age > 69, female, HTN, DM, CHF, CAD).  Decision was made to start chronic anticoagulation with Eliquis.    She was seen in FU by Pulmonology Rubye Oaks, NP) on 01/12/16.  Patient had re-accumulation of R pleural effusion.  She was set up for repeat thoracentesis which was done 6/16.   she is also been on chronic oxygen therapy with 2 L by nasal cannula.  03/01/2016  [name] was seen here today in follow-up. Recently she was seen by her home health nurse and noted she been bradycardiac and reports. The time were  heart rate is very low associated with low blood pressure and fatigue. Heart rate today is in the low 50s. She was placed on amiodarone and maintained on metoprolol for recent admission for atrial fibrillation and rapid ventricular response. She seems to be maintaining a normal rhythm. She was also placed on Eliquis for anticoagulation and remains on low-dose aspirin. She was noted to be only on 1/2 L per minute of oxygen and I instructed her on how to properly set her regulator.  PMHx:  Past Medical History:  Diagnosis Date  . Candidal esophagitis (HCC)    a. 03/2014.  Marland Kitchen Cholelithiasis   . Chronic diastolic CHF (congestive heart failure) (HCC)    a. 06/2011 Echo: EF 60-65%, no rwma, Gr1 DD, mild TR. // b. Echo 12/19/15: Severe LVH, EF 55-60%, normal wall motion, mild MR, moderate LAE, moderate TR, PASP 44 mmHg  . CKD (chronic kidney disease), stage III    a. in setting of prior ESRD and cadaveric tx in 2003.  Marland Kitchen Coronary artery disease    a. 01/2001 Cath/PCI: LAD 50p, 54m, D1 50-60, RCA 45m (3.0x13 BX Velocity BMS).// b. NSTEMI 5/17 (demand ischemia) in setting of AF with RVR, pneumonia, CKD, a/c HF >> Myoview 12/26/15: prob inf infarct, no ischemia, EF 52%, Low Risk >> med Rx  . Gastritis    a. 03/2014  . Gout    unconfirmed by joint aspiration  . History of bacteremia    a. 08/2004 Group A Strep bacteremia.  . Hypertensive heart disease   .  S/P kidney transplant    a. due to FSGN, follows with Dr. Vivia Birmingham in 2003. Was on HD prior to that.  . Type II diabetes mellitus (HCC)     Past Surgical History:  Procedure Laterality Date  . ABDOMINAL HYSTERECTOMY    . ESOPHAGOGASTRODUODENOSCOPY N/A 03/20/2014   Procedure: ESOPHAGOGASTRODUODENOSCOPY (EGD);  Surgeon: Theda Belfast, MD;  Location: [facility] ENDOSCOPY;  Service: Endoscopy;  Laterality: N/A;  . KIDNEY TRANSPLANT  2003    FAMHx:  Family History  Problem Relation Age of Onset  . CAD Father   . CAD Brother   . CAD      Both  parents and multiple siblings have died from coronary disease prior to age 4 and several in their 62's and 54's.    SOCHx:   reports that she quit smoking about 46 years ago. She has never used smokeless tobacco. She reports that she does not drink alcohol or use drugs.  ALLERGIES:  Allergies  Allergen Reactions  . Sulfa Drugs Cross Reactors Swelling    ROS: Pertinent items noted in HPI and remainder of comprehensive ROS otherwise negative.  HOME MEDS: Current Outpatient Prescriptions  Medication Sig Dispense Refill  . allopurinol (ZYLOPRIM) 100 MG tablet Take 100 mg by mouth at bedtime.    Marland Kitchen amiodarone (PACERONE) 200 MG tablet Take 1 tablet (200 mg total) by mouth 2 (two) times daily. 60 tablet 12  . apixaban (ELIQUIS) 5 MG TABS tablet Take 1 tablet (5 mg total) by mouth 2 (two) times daily. 180 tablet 3  . aspirin EC 81 MG tablet Take 81 mg by mouth daily.    . cinacalcet (SENSIPAR) 30 MG tablet Take 1 tablet by mouth 2 (two) times a week.    . cinacalcet (SENSIPAR) 90 MG tablet Take 30 mg by mouth daily.     . colchicine 0.6 MG tablet Take 0.6 mg by mouth daily as needed (FOR GOUT). Reported on 01/21/2016    . famotidine (PEPCID) 20 MG tablet Take 20 mg by mouth 2 (two) times daily.    . furosemide (LASIX) 80 MG tablet Take 80 mg by mouth 2 (two) times daily.    . mycophenolate (CELLCEPT) 250 MG capsule Take 500 mg by mouth 2 (two) times daily.    . tacrolimus (PROGRAF) 0.5 MG capsule 1 mg in the morning and 1 mg at bedtime (Patient taking differently: Take 1.5 mg by mouth 2 (two) times daily. ) 120 capsule 0  . metoprolol tartrate (LOPRESSOR) 25 MG tablet Take 1 tablet (25 mg total) by mouth 2 (two) times daily. 60 tablet    No current facility-administered medications for this visit.     LABS/IMAGING: No results found for this or any previous visit (from the past 48 hour(s)). No results found.  WEIGHTS: Wt Readings from Last 3 Encounters:  03/01/16 156 lb (70.8 kg)    02/12/16 156 lb (70.8 kg)  02/06/16 156 lb 9.6 oz (71 kg)    VITALS: BP 114/69   Pulse (!) 54   Ht 5\' 3"  (1.6 m)   Wt 156 lb (70.8 kg)   BMI 27.63 kg/m   EXAM: General appearance: alert, no distress and On nasal cannula oxygen in a wheelchair Neck: no carotid bruit and no JVD Lungs: diminished breath sounds bilaterally Heart: regular rate and rhythm Abdomen: soft, non-tender; bowel sounds normal; no masses,  no organomegaly Extremities: extremities normal, atraumatic, no cyanosis or edema Pulses: 2+ and symmetric Skin: Pale, cool, dry Neurologic: Grossly normal  Psych: Flat affect  EKG: Deferred  ASSESSMENT: 1. Coronary artery disease with history of BMS to the RCA in 2002 2. End-stage renal disease status post renal transplant secondary to FSGN 3. Hypertensive heart disease 4. Chronic diastolic heart failure 5. Recent atrial fibrillation 6. Gout 7. Type 2 diabetes 8. Pleural effusion/bilateral infiltrates  PLAN: 1.   Mrs. Smarr feel somewhat unwell although has not been using oxygen as prescribed. I adjusted her regulator today to 2 L/m and we informed [facility] that she should need a new regulator as he cannot read the 2 L setting in the adjustment window. With regards to her atrial fibrillation, she is now on amiodarone although heart rate is low and she feels fatigue. I recommended decreasing her metoprolol to 25 mg twice a day and continuing amiodarone at its current dose of 200 mg daily. She's had no bleeding complications on Eliquis. She appears euvolemic today on high-dose Lasix.  Chrystie Nose, MD, [facility] Attending Cardiologist [facility]  Chrystie Nose 03/01/2016, 5:48 PM

## 2016-03-02 ENCOUNTER — Telehealth: Payer: Self-pay | Admitting: Internal Medicine

## 2016-03-02 ENCOUNTER — Telehealth: Payer: Self-pay

## 2016-03-02 ENCOUNTER — Other Ambulatory Visit: Payer: Self-pay

## 2016-03-02 DIAGNOSIS — N183 Chronic kidney disease, stage 3 (moderate): Secondary | ICD-10-CM | POA: Diagnosis not present

## 2016-03-02 DIAGNOSIS — I2510 Atherosclerotic heart disease of native coronary artery without angina pectoris: Secondary | ICD-10-CM | POA: Diagnosis not present

## 2016-03-02 DIAGNOSIS — I4891 Unspecified atrial fibrillation: Secondary | ICD-10-CM | POA: Diagnosis not present

## 2016-03-02 DIAGNOSIS — I130 Hypertensive heart and chronic kidney disease with heart failure and stage 1 through stage 4 chronic kidney disease, or unspecified chronic kidney disease: Secondary | ICD-10-CM | POA: Diagnosis not present

## 2016-03-02 DIAGNOSIS — I5033 Acute on chronic diastolic (congestive) heart failure: Secondary | ICD-10-CM | POA: Diagnosis not present

## 2016-03-02 DIAGNOSIS — Z8701 Personal history of pneumonia (recurrent): Secondary | ICD-10-CM | POA: Diagnosis not present

## 2016-03-02 NOTE — Patient Outreach (Signed)
[facility]) Care Management  [facility] Care Manager  03/02/2016   [name] 02-15-1932 [id number]  Subjective:  I have my good days and my bad days I have not started weighing daily, I have all my medications and I have been taking them very good.   Objective:  Patient was receptive to instructions via telephone on diet, low sodium food choices.   Encounter Medications:  Outpatient Encounter Prescriptions as of 03/02/2016  Medication Sig Note  . allopurinol (ZYLOPRIM) 100 MG tablet Take 100 mg by mouth at bedtime.   Marland Kitchen amiodarone (PACERONE) 200 MG tablet Take 1 tablet (200 mg total) by mouth 2 (two) times daily.   Marland Kitchen apixaban (ELIQUIS) 5 MG TABS tablet Take 1 tablet (5 mg total) by mouth 2 (two) times daily.   Marland Kitchen aspirin EC 81 MG tablet Take 81 mg by mouth daily.   . cinacalcet (SENSIPAR) 30 MG tablet Take 1 tablet by mouth 2 (two) times a week. 03/01/2016: Received from: [facility] Received Sig: 1 tab po qOD  . cinacalcet (SENSIPAR) 90 MG tablet Take 30 mg by mouth daily.    . colchicine 0.6 MG tablet Take 0.6 mg by mouth daily as needed (FOR GOUT). Reported on 01/21/2016 01/21/2016: Reports that she was told to take 1 tablet by mouth every hour three times (for 3 tablets total) for gout flare  . famotidine (PEPCID) 20 MG tablet Take 20 mg by mouth 2 (two) times daily.   . furosemide (LASIX) 80 MG tablet Take 80 mg by mouth 2 (two) times daily.   . metoprolol tartrate (LOPRESSOR) 25 MG tablet Take 1 tablet (25 mg total) by mouth 2 (two) times daily.   . mycophenolate (CELLCEPT) 250 MG capsule Take 500 mg by mouth 2 (two) times daily.   . tacrolimus (PROGRAF) 0.5 MG capsule 1 mg in the morning and 1 mg at bedtime (Patient taking differently: Take 1.5 mg by mouth 2 (two) times daily. )    No facility-administered encounter medications on file as of 03/02/2016.     Functional Status:  In your present state of health, do you have any difficulty  performing the following activities: 03/02/2016 02/05/2016  Hearing? N N  Vision? Y N  Difficulty concentrating or making decisions? N N  Walking or climbing stairs? Y N  Dressing or bathing? N N  Doing errands, shopping? N N  Preparing Food and eating ? N -  Using the Toilet? N -  In the past six months, have you accidently leaked urine? N -  Do you have problems with loss of bowel control? N -  Managing your Medications? N -  Managing your Finances? N -  Housekeeping or managing your Housekeeping? N -  Some recent data might be hidden    Fall/Depression Screening: PHQ 2/9 Scores 03/02/2016 01/14/2016 07/05/2012  PHQ - 2 Score 0 0 0    Assessment:  Patient verbalized a sound understanding of her diet and healthy food choices. Patient continues to lead a sedentary lifestyle. Patient states she does not weigh dialy, does not understand the significants of daily weights.    Plan:  Home visit later this month for heart failure education, assessment for community care coordination needs.

## 2016-03-02 NOTE — Telephone Encounter (Signed)
Received In Office Oximetry Testing Oxygen Saturation Report from [facility] for patient. Per Dr Rennis Golden I will fax back the form stating Defer to Pulmonologist. And patient needs a new regulator because the number cant be read.

## 2016-03-02 NOTE — Telephone Encounter (Signed)
Nurse from [facility] calling about patient

## 2016-03-02 NOTE — Telephone Encounter (Signed)
[facility] calling for VO for continued nursing services for the next 60 days, VO given, do you agree?

## 2016-03-03 DIAGNOSIS — I130 Hypertensive heart and chronic kidney disease with heart failure and stage 1 through stage 4 chronic kidney disease, or unspecified chronic kidney disease: Secondary | ICD-10-CM | POA: Diagnosis not present

## 2016-03-03 DIAGNOSIS — I4891 Unspecified atrial fibrillation: Secondary | ICD-10-CM | POA: Diagnosis not present

## 2016-03-03 DIAGNOSIS — I2510 Atherosclerotic heart disease of native coronary artery without angina pectoris: Secondary | ICD-10-CM | POA: Diagnosis not present

## 2016-03-03 DIAGNOSIS — I5033 Acute on chronic diastolic (congestive) heart failure: Secondary | ICD-10-CM | POA: Diagnosis not present

## 2016-03-03 DIAGNOSIS — Z8701 Personal history of pneumonia (recurrent): Secondary | ICD-10-CM | POA: Diagnosis not present

## 2016-03-03 DIAGNOSIS — Z940 Kidney transplant status: Secondary | ICD-10-CM | POA: Diagnosis not present

## 2016-03-03 DIAGNOSIS — Z7982 Long term (current) use of aspirin: Secondary | ICD-10-CM | POA: Diagnosis not present

## 2016-03-03 DIAGNOSIS — N183 Chronic kidney disease, stage 3 (moderate): Secondary | ICD-10-CM | POA: Diagnosis not present

## 2016-03-03 DIAGNOSIS — Z87891 Personal history of nicotine dependence: Secondary | ICD-10-CM | POA: Diagnosis not present

## 2016-03-03 NOTE — Telephone Encounter (Signed)
Yes. Thanks!  Dr. Truong 

## 2016-03-09 ENCOUNTER — Ambulatory Visit: Payer: Self-pay

## 2016-03-10 DIAGNOSIS — I2510 Atherosclerotic heart disease of native coronary artery without angina pectoris: Secondary | ICD-10-CM | POA: Diagnosis not present

## 2016-03-10 DIAGNOSIS — I4891 Unspecified atrial fibrillation: Secondary | ICD-10-CM | POA: Diagnosis not present

## 2016-03-10 DIAGNOSIS — I130 Hypertensive heart and chronic kidney disease with heart failure and stage 1 through stage 4 chronic kidney disease, or unspecified chronic kidney disease: Secondary | ICD-10-CM | POA: Diagnosis not present

## 2016-03-10 DIAGNOSIS — Z87891 Personal history of nicotine dependence: Secondary | ICD-10-CM | POA: Diagnosis not present

## 2016-03-10 DIAGNOSIS — N183 Chronic kidney disease, stage 3 (moderate): Secondary | ICD-10-CM | POA: Diagnosis not present

## 2016-03-10 DIAGNOSIS — I5033 Acute on chronic diastolic (congestive) heart failure: Secondary | ICD-10-CM | POA: Diagnosis not present

## 2016-03-12 ENCOUNTER — Ambulatory Visit: Payer: Medicare Other | Admitting: Pharmacist

## 2016-03-12 ENCOUNTER — Ambulatory Visit (INDEPENDENT_AMBULATORY_CARE_PROVIDER_SITE_OTHER): Payer: Medicare Other | Admitting: Internal Medicine

## 2016-03-12 ENCOUNTER — Ambulatory Visit (INDEPENDENT_AMBULATORY_CARE_PROVIDER_SITE_OTHER)
Admission: RE | Admit: 2016-03-12 | Discharge: 2016-03-12 | Disposition: A | Discharge status: Home / Self Care | Payer: Medicare Other | Source: Ambulatory Visit | Attending: Internal Medicine | Admitting: Internal Medicine

## 2016-03-12 ENCOUNTER — Telehealth: Payer: Self-pay | Admitting: Internal Medicine

## 2016-03-12 ENCOUNTER — Other Ambulatory Visit (INDEPENDENT_AMBULATORY_CARE_PROVIDER_SITE_OTHER): Payer: Medicare Other

## 2016-03-12 ENCOUNTER — Encounter: Payer: Self-pay | Admitting: Internal Medicine

## 2016-03-12 ENCOUNTER — Encounter (INDEPENDENT_AMBULATORY_CARE_PROVIDER_SITE_OTHER): Payer: Self-pay

## 2016-03-12 VITALS — BP 124/76 | HR 65 | Ht 63.0 in | Wt 154.2 lb

## 2016-03-12 DIAGNOSIS — J9611 Chronic respiratory failure with hypoxia: Secondary | ICD-10-CM | POA: Diagnosis not present

## 2016-03-12 DIAGNOSIS — I2510 Atherosclerotic heart disease of native coronary artery without angina pectoris: Secondary | ICD-10-CM | POA: Diagnosis not present

## 2016-03-12 DIAGNOSIS — Z9861 Coronary angioplasty status: Secondary | ICD-10-CM | POA: Diagnosis not present

## 2016-03-12 DIAGNOSIS — J13 Pneumonia due to Streptococcus pneumoniae: Secondary | ICD-10-CM

## 2016-03-12 DIAGNOSIS — J90 Pleural effusion, not elsewhere classified: Secondary | ICD-10-CM | POA: Diagnosis not present

## 2016-03-12 DIAGNOSIS — R0602 Shortness of breath: Secondary | ICD-10-CM | POA: Diagnosis not present

## 2016-03-12 DIAGNOSIS — R05 Cough: Secondary | ICD-10-CM | POA: Diagnosis not present

## 2016-03-12 LAB — CBC WITH DIFFERENTIAL/PLATELET
BASOS ABS: 0 10*3/uL (ref 0.0–0.1)
Basophils Relative: 0.4 % (ref 0.0–3.0)
EOS ABS: 0.3 10*3/uL (ref 0.0–0.7)
EOS PCT: 3.3 % (ref 0.0–5.0)
HCT: 35.9 % — ABNORMAL LOW (ref 36.0–46.0)
Hemoglobin: 11.8 g/dL — ABNORMAL LOW (ref 12.0–15.0)
LYMPHS ABS: 1.7 10*3/uL (ref 0.7–4.0)
LYMPHS PCT: 20.9 % (ref 12.0–46.0)
MCHC: 32.8 g/dL (ref 30.0–36.0)
MCV: 87.3 fl (ref 78.0–100.0)
MONOS PCT: 10.6 % (ref 3.0–12.0)
Monocytes Absolute: 0.8 10*3/uL (ref 0.1–1.0)
NEUTROS PCT: 64.8 % (ref 43.0–77.0)
Neutro Abs: 5.1 10*3/uL (ref 1.4–7.7)
PLATELETS: 241 10*3/uL (ref 150.0–400.0)
RBC: 4.11 Mil/uL (ref 3.87–5.11)
RDW: 15.3 % (ref 11.5–15.5)
WBC: 7.9 10*3/uL (ref 4.0–10.5)

## 2016-03-12 LAB — SEDIMENTATION RATE: SED RATE: 93 mm/h — AB (ref 0–30)

## 2016-03-12 NOTE — Patient Instructions (Addendum)
Please remember to go to the lab   department downstairs for your tests - we will call you with the results when they are available.  Stay on 02  But reduce to 1lpm   Please schedule a follow up office visit in 4 weeks, sooner if needed with cxr on return to see Dr Craige CottaSood

## 2016-03-13 ENCOUNTER — Encounter: Payer: Self-pay | Admitting: Internal Medicine

## 2016-03-13 DIAGNOSIS — J9611 Chronic respiratory failure with hypoxia: Secondary | ICD-10-CM | POA: Insufficient documentation

## 2016-03-13 NOTE — Progress Notes (Signed)
Subjective:     Patient ID: [name], female   DOB: 14-Jul-1932, 82 y.o.   MRN: [id number]  HPI  History of Present Illness 34 yobf minimal smoking quit around 1970  with seen for pulmonary consult during hospitalization May 2017 with a pneumococcal pneumonia with associated bacteremia and acute hypoxic respiratory failure. She has stage II chronic kidney disease status post renal transplant, diabetes, chronic diastolic congestive heart failure and atrial fib with RVR, on Eliquis.   02/12/2016 Pneumonia/Pleural Effusion Follow Up: Pt. Presents  for follow up for pleural effusion and pneumonia diagnosed  12/18/2015. Patient was admitted May 2017 for pneumococcal pneumonia with associated bacteremia and acute hypoxic respiratory failure. Hospitalization was compensated by atrial filb with RVR and NSTEMI demand ischemia. She was seen for pulmonary consult during this hospitalization. She was found to have a large right exudative pleural effusion-felt to be parapneumonic. She underwent thoracentesis times 2 with 1460 cc and then 800cc . Cytology neg for malignant cells.  Initial CT chest on May 23 showed a large right pleural effusion with right lower lobe collapse Repeat CT chest on May 31 showed complete atelectasis of the right lower lobe with significant atelectasis of the right middle lobe. The proximal right middle lobe bronchus appears occluded. Small to moderate right pleural effusion is decreased from previous CT scan. She was treated with antibiotics and diuretics were adjusted. . 2 D echo showed w/ EF 55%, mod LA dilation, mod TV regurg, PAP 44.  She was recently hospitalized July 4th for atrial fib with RVR. She did not want cardioversion. She has been compliant with her Eliquis. CXR today indicates continued atelectasis or infiltrate. Pt. States she continues to have shortness of  breath with activity.She is using her oxygen at 2L [place] 24/7. She is interested in weaning off the oxygen, but I  told her that since she continues to have dyspnea, we need to continue oxygen therapy until she is better.She states she continues to cough up green secretions with some blood. She states she has been coughing up blood since before her first hospital admission. She denies fever, chest pain, or orthopnea, leg or calf pain or tenderness. rec augmentin x 7 more days    03/12/2016  f/u ov/Mirian Casco re: s/p pneumocal pna/ sepsis/ R>>L effusions Chief Complaint  Patient presents with  . Follow-up    CXR today. Pt states she has right lateral rib pain when moving. Pt denies pain on left lateral rib side. Pt c/o fatigue. Pt denies cough, f/c/s and CP/tightness.   Overall improved with breathing and no cough or pleuritic cp. She has a new R flank discomfort that is exquistely positional, better on tylenol, "feels like she pullned a muscle with acute onset 3 d prior to OV  And no assoc n or v or anorexia. Sob or fever or rash or change with breath or cough.  No obvious day to day or daytime variability or assoc excess/ purulent sputum or mucus plugs or hemoptysis or  chest tightness, subjective wheeze or overt sinus or hb symptoms. No unusual exp hx or h/o childhood pna/ asthma or knowledge of premature birth.  Sleeping ok without nocturnal  or early am exacerbation  of respiratory  c/o's or need for noct saba. Also denies any obvious fluctuation of symptoms with weather or environmental changes or other aggravating or alleviating factors except as outlined above   Current Medications, Allergies, Complete Past Medical History, Past Surgical History, Family History, and Social History were  reviewed in [facility] electronic medical record.  ROS  The following are not active complaints unless bolded sore throat, dysphagia, dental problems, itching, sneezing,  nasal congestion or excess/ purulent secretions, ear ache,   fever, chills, sweats, unintended wt loss, classically pleuritic or exertional cp,  orthopnea  pnd or leg swelling, presyncope, palpitations, abdominal pain, anorexia, nausea, vomiting, diarrhea  or change in bowel or bladder habits, change in stools or urine, dysuria,hematuria,  rash, arthralgias, visual complaints, headache, numbness, weakness or ataxia or problems with walking or coordination,  change in mood/affect or memory.           Significant Events: 12/19/2015>>2 D echo showed w/ EF 55%, mod LA dilation, mod TV regurg, PAP 44.  12/23/15>> CT scan on 5/23 showed showed a large right pleural effusion with right lower lobe collapse and patchy airspace disease of the left lower lobe, finding suspicion for aspiration pneumonia, endobronchial lesion causing collapse of the right lower lobe is also in the differential diagnosis    12/24/15>> R Thorocentesis>> 1640 cc clear yellow, most likely exudative by LDH. ( 10 days IV Rocephin) 12/28/15>> Reaccumulation>> parapneumonic effusion and had another 83180mL's of pleural fluid removed( ABX resumed) 12/31/15>> Repeat CT scan of the chest without contrast on 5/31 showed no convincing right lower lobe mass, small to moderate right pleural effusion. Pulmonary recommended aggressive diuresis to help manage her pleural effusion Patient is also being sent home on another 5 days of Augmentin. 01/16/16>>Thorocentesis>> 1 Liter + amber yellow pleural fluid   Past medical hx Past Medical History  Diagnosis Date  . S/P kidney transplant     a. due to FSGN, follows with Dr. Vivia [place]-->Transplant in 2003. Was on HD prior to that.  . Coronary artery disease     a. 01/2001 Cath/PCI: LAD 50p, 3759m, D1 50-60, RCA 7822m (3.0x13 BX Velocity BMS).// b. NSTEMI 5/17 (demand ischemia) in setting of AF with RVR, pneumonia, CKD, a/c HF >> Myoview 12/26/15: prob inf infarct, no ischemia, EF 52%, Low Risk >> med Rx  . Hypertensive heart disease   . Cholelithiasis   . Chronic diastolic CHF (congestive heart failure) (HCC)     a. 06/2011 Echo: EF 60-65%, no rwma, Gr1  DD, mild TR. // b. Echo 12/19/15: Severe LVH, EF 55-60%, normal wall motion, mild MR, moderate LAE, moderate TR, PASP 44 mmHg  . Gout     unconfirmed by joint aspiration  . CKD (chronic kidney disease), stage III     a. in setting of prior ESRD and cadaveric tx in 2003.  . Candidal esophagitis (HCC)     a. 03/2014.  Marland Kitchen. Gastritis     a. 03/2014  . Type II diabetes mellitus (HCC)   . History of bacteremia     a. 08/2004 Group A Strep bacteremia.             Review of Systems     Objective:   Physical Exam    W/c bound elderly bf = stated age in appearance  Wt Readings from Last 3 Encounters:  03/12/16 154 lb 3.2 oz (69.9 kg)  03/01/16 156 lb (70.8 kg)  02/12/16 156 lb (70.8 kg)    Vital signs reviewed - 02 sats 100% on 2lpm   General- No distress,  A&Ox3, frail elderly female in a wheel chair. ENT: No sinus tenderness, TM clear, pale nasal mucosa, no oral exudate,no post nasal drip, no LAN Cardiac: S1, S2, regular rate and rhythm, no murmur Chest: No wheeze/ rales/  diminished per bases bilaterally R >>L with dullnes ; no accessory muscle use, no nasal flaring, no sternal retractions Abd.: Soft Non-tender Ext: No clubbing cyanosis, edema Neuro:  normal strength Skin: No rashes, warm and dry Psych: normal mood and behavior    CXR PA and Lateral:   03/12/2016 :    I personally reviewed images and agree with radiology impression as follows:     Mild interval increase in the volume of the small to moderate size right pleural effusion. No evidence of pneumonia. Stable cardiomegaly without pulmonary vascular congestion.  Labs ordered/ reviewed:      Chemistry      Component Value Date/Time   NA 136 02/05/2016 0400   NA 138 01/19/2016 1155   K 3.5 02/05/2016 0400   CL 99 (L) 02/05/2016 0400   CO2 26 02/05/2016 0400   BUN 24 (H) 02/05/2016 0400   BUN 22 01/19/2016 1155   CREATININE 1.45 (H) 02/05/2016 0400      Component Value Date/Time   CALCIUM 9.6 02/05/2016  0400   ALKPHOS 84 01/01/2016 0345   AST 19 01/01/2016 0345   ALT 12 (L) 01/01/2016 0345   BILITOT 0.4 01/01/2016 0345        Lab Results  Component Value Date   WBC 7.9 03/12/2016   HGB 11.8 (L) 03/12/2016   HCT 35.9 (L) 03/12/2016   MCV 87.3 03/12/2016   PLT 241.0 03/12/2016        Lab Results  Component Value Date   TSH 2.077 12/18/2015      Lab Results  Component Value Date   ESRSEDRATE 93 (H) 03/12/2016       Assessment:

## 2016-03-13 NOTE — Assessment & Plan Note (Signed)
12/19/2015>>2 D echo showed w/ EF 55%, mod LA dilation, mod TV regurg, PAP 44.  12/23/15>> CT scan on 5/23 showed showed a large right pleural effusion with right lower lobe collapse and patchy airspace disease of the left lower lobe, finding suspicion for aspiration pneumonia, endobronchial lesion causing collapse of the right lower lobe is also in the differential diagnosis    12/24/15>> R Thorocentesis>> 1640 cc clear yellow, most likely exudative by LDH. ( 10 days IV Rocephin) 12/28/15>> Reaccumulation>> parapneumonic effusion and had another 863m's of pleural fluid removed( ABX resumed) 12/31/15>> Repeat CT scan of the chest without contrast on 5/31 showed no convincing right lower lobe mass, small to moderate right pleural effusion. Pulmonary recommended aggressive diuresis to help manage her pleural effusion Patient d/c  another 5 days of Augmentin. 01/16/16>>R Thorocentesis>> 1 Liter + amber yellow pleural fluid Wbc 230 (down from prev) with 95% L/ transudative chem  No evidence of empyema or complicated parapneumonic process at this point though the high ESR is worrisome for ongoing systemic inflammation (in pt on AMIODARONE/ 02) and now has unexplained R flank pain on eliquis so need to observe for hematoma otherwise rx as muscle strain approp for now.  I had an extended discussion with the patient/fm  reviewing all relevant studies (including extremely complicated hosp course/ interventions)  completed to date and  lasting 25 minutes of a 40  minute transition of care visit    Each maintenance medication was reviewed in detail including most importantly the difference between maintenance and prns and under what circumstances the prns are to be triggered using an action plan format that is not reflected in the computer generated alphabetically organized AVS.    Please see instructions for details which were reviewed in writing and the patient given a copy highlighting the part that I  personally wrote and discussed at today's ov.

## 2016-03-13 NOTE — Assessment & Plan Note (Signed)
rx with 02 at d/c from strep pna May 2017   As of 03/12/2016 ok on 1lpm 24/7

## 2016-03-13 NOTE — Assessment & Plan Note (Signed)
Resolved, no further abx needed

## 2016-03-16 ENCOUNTER — Ambulatory Visit: Payer: Medicare Other | Admitting: Pharmacist

## 2016-03-17 ENCOUNTER — Ambulatory Visit (INDEPENDENT_AMBULATORY_CARE_PROVIDER_SITE_OTHER): Payer: Medicare Other | Admitting: Internal Medicine

## 2016-03-17 ENCOUNTER — Ambulatory Visit (HOSPITAL_COMMUNITY)
Admission: RE | Admit: 2016-03-17 | Discharge: 2016-03-17 | Disposition: A | Discharge status: Home / Self Care | Payer: Medicare Other | Source: Ambulatory Visit | Attending: Internal Medicine | Admitting: Internal Medicine

## 2016-03-17 ENCOUNTER — Ambulatory Visit: Payer: Medicare Other | Admitting: Pharmacist

## 2016-03-17 VITALS — BP 116/52 | HR 55 | Temp 98.2°F | Ht 63.0 in | Wt 153.8 lb

## 2016-03-17 DIAGNOSIS — I480 Paroxysmal atrial fibrillation: Secondary | ICD-10-CM | POA: Insufficient documentation

## 2016-03-17 DIAGNOSIS — I10 Essential (primary) hypertension: Secondary | ICD-10-CM

## 2016-03-17 DIAGNOSIS — R9431 Abnormal electrocardiogram [ECG] [EKG]: Secondary | ICD-10-CM | POA: Diagnosis not present

## 2016-03-17 DIAGNOSIS — R001 Bradycardia, unspecified: Secondary | ICD-10-CM | POA: Insufficient documentation

## 2016-03-17 DIAGNOSIS — Z87891 Personal history of nicotine dependence: Secondary | ICD-10-CM | POA: Diagnosis not present

## 2016-03-17 DIAGNOSIS — I440 Atrioventricular block, first degree: Secondary | ICD-10-CM | POA: Diagnosis not present

## 2016-03-17 DIAGNOSIS — Z79899 Other long term (current) drug therapy: Secondary | ICD-10-CM | POA: Diagnosis not present

## 2016-03-17 DIAGNOSIS — Z7901 Long term (current) use of anticoagulants: Secondary | ICD-10-CM

## 2016-03-17 NOTE — Patient Instructions (Addendum)
Thank you for your visit today  Please continue your current medications  Please follow up with the heart doctor on August 30th

## 2016-03-17 NOTE — Progress Notes (Signed)
   CC: follow up of her htn, and afib HPI: [name] is a 82 y.o. woman with PMH noted below here for htn and afib. She is not sure what she is here forthis time, but presumably afib and htn.   Please see Problem List/A&P for the status of the patient's chronic medical problems   Past Medical History:  Diagnosis Date  . Candidal esophagitis (HCC)    a. 03/2014.  Marland Kitchen. Cholelithiasis   . Chronic diastolic CHF (congestive heart failure) (HCC)    a. 06/2011 Echo: EF 60-65%, no rwma, Gr1 DD, mild TR. // b. Echo 12/19/15: Severe LVH, EF 55-60%, normal wall motion, mild MR, moderate LAE, moderate TR, PASP 44 mmHg  . CKD (chronic kidney disease), stage III    a. in setting of prior ESRD and cadaveric tx in 2003.  Marland Kitchen. Coronary artery disease    a. 01/2001 Cath/PCI: LAD 50p, 6030m, D1 50-60, RCA 8951m (3.0x13 BX Velocity BMS).// b. NSTEMI 5/17 (demand ischemia) in setting of AF with RVR, pneumonia, CKD, a/c HF >> Myoview 12/26/15: prob inf infarct, no ischemia, EF 52%, Low Risk >> med Rx  . Gastritis    a. 03/2014  . Gout    unconfirmed by joint aspiration  . History of bacteremia    a. 08/2004 Group A Strep bacteremia.  . Hypertensive heart disease   . S/P kidney transplant    a. due to FSGN, follows with Dr. Vivia BirminghamMattingly-->Transplant in 2003. Was on HD prior to that.  . Type II diabetes mellitus (HCC)     Review of Systems: Denies fevers, weight loss, fatigue Denies CP, SOB, palpitations Denies n/v/abd pain, diarrhea  Physical Exam: Vitals:   03/17/16 1514  BP: (!) 116/52  Pulse: (!) 55  Temp: 98.2 F (36.8 C)  TempSrc: Oral  SpO2: 100%  Weight: 153 lb 12.8 oz (69.8 kg)  Height: 5\' 3"  (1.6 m)    General: A&O, in NAD, on 1 L oxygen CV: RRR, normal s1, s2, no m/r/g Resp: decreased breath sounds on RLL Abdomen: soft, nontender, nondistended, +BS   Assessment & Plan:   See encounters tab for problem based medical decision making. Patient discussed with Dr. Oswaldo DoneVincent

## 2016-03-17 NOTE — Assessment & Plan Note (Addendum)
Pt was admitted 5/18 to 6/11 for CAP and had afib. She subsequently went to the ER on 7/4 with Afib with RVR. Given IV amiodarone and cardiazem.  Currently she is on amiodarone, and eliquis. EKG performed today showed P waves and normal sinus rhythm with 1st degree AV block. HR of 57.  -Continue currentmeds -Follow up with cardiology

## 2016-03-17 NOTE — Assessment & Plan Note (Signed)
BP Readings from Last 3 Encounters:  03/17/16 (!) 116/52  03/12/16 124/76  03/01/16 114/69   Patient's blood pressure is at goal today and is 116/52  -Continue amlodipine,  Lasix and amiodarone -Follow up with cardiology on 8/30

## 2016-03-18 DIAGNOSIS — I4891 Unspecified atrial fibrillation: Secondary | ICD-10-CM | POA: Diagnosis not present

## 2016-03-18 DIAGNOSIS — I5033 Acute on chronic diastolic (congestive) heart failure: Secondary | ICD-10-CM | POA: Diagnosis not present

## 2016-03-18 DIAGNOSIS — Z87891 Personal history of nicotine dependence: Secondary | ICD-10-CM | POA: Diagnosis not present

## 2016-03-18 DIAGNOSIS — N183 Chronic kidney disease, stage 3 (moderate): Secondary | ICD-10-CM | POA: Diagnosis not present

## 2016-03-18 DIAGNOSIS — I2510 Atherosclerotic heart disease of native coronary artery without angina pectoris: Secondary | ICD-10-CM | POA: Diagnosis not present

## 2016-03-18 DIAGNOSIS — I130 Hypertensive heart and chronic kidney disease with heart failure and stage 1 through stage 4 chronic kidney disease, or unspecified chronic kidney disease: Secondary | ICD-10-CM | POA: Diagnosis not present

## 2016-03-18 NOTE — Progress Notes (Signed)
Internal Medicine Clinic Attending  Case discussed with Dr. Johnny BridgeSaraiya at the time of the visit.  We reviewed the resident's history and exam and pertinent patient test results.  I agree with the assessment, diagnosis, and plan of care documented in the resident's note.  I personally reviewed her ECG with Dr. Linna CapriceSaraya; she is now in sinus rhythm with 1st degree AV block and bradycardia, she has a wide QRS with a bifascicular block.

## 2016-03-18 NOTE — Progress Notes (Signed)
Medication Samples have been provided to the patient.  Drug: apixaban (Eliquis) Strength: 5 mg Qty: 56  LOT: [id number] Exp.Date: 9/19 Dosing instructions: 1 tablet BID  The patient has been instructed regarding the correct time, dose, and frequency of taking this medication, including desired effects and most common side effects.   Kim,Jennifer J 10:08 AM 03/18/2016

## 2016-03-23 ENCOUNTER — Other Ambulatory Visit: Payer: Self-pay

## 2016-03-23 NOTE — Patient Outreach (Signed)
[facility]) Care Management   03/23/2016  [name] Aug 24, 1931 [id number]  [name] is an 82 y.o. female  Subjective:   Objective:   ROS  Physical Exam  Encounter Medications:   Outpatient Encounter Prescriptions as of 03/23/2016  Medication Sig Note  . allopurinol (ZYLOPRIM) 100 MG tablet Take 100 mg by mouth at bedtime.   Marland Kitchen amiodarone (PACERONE) 200 MG tablet Take 1 tablet (200 mg total) by mouth 2 (two) times daily.   Marland Kitchen apixaban (ELIQUIS) 5 MG TABS tablet Take 1 tablet (5 mg total) by mouth 2 (two) times daily.   Marland Kitchen aspirin EC 81 MG tablet Take 81 mg by mouth daily.   . cinacalcet (SENSIPAR) 30 MG tablet Take 1 tablet by mouth 2 (two) times a week. 03/01/2016: Received from: [place]: 1 tab po qOD  . cinacalcet (SENSIPAR) 90 MG tablet Take 30 mg by mouth daily.    . famotidine (PEPCID) 20 MG tablet Take 20 mg by mouth 2 (two) times daily.   . furosemide (LASIX) 80 MG tablet Take 80 mg by mouth 2 (two) times daily.   . metoprolol tartrate (LOPRESSOR) 25 MG tablet Take 1 tablet (25 mg total) by mouth 2 (two) times daily. (Patient taking differently: Take 12.5 mg by mouth 2 (two) times daily. )   . mycophenolate (CELLCEPT) 250 MG capsule Take 500 mg by mouth 2 (two) times daily.   . tacrolimus (PROGRAF) 0.5 MG capsule 1 mg in the morning and 1 mg at bedtime (Patient taking differently: Take 1.5 mg by mouth 2 (two) times daily. )    No facility-administered encounter medications on file as of 03/23/2016.     Functional Status:   In your present state of health, do you have any difficulty performing the following activities: 03/17/2016 03/02/2016  Hearing? N N  Vision? Y Y  Difficulty concentrating or making decisions? N N  Walking or climbing stairs? Y Y  Dressing or bathing? N N  Doing errands, shopping? N N  Preparing Food and eating ? - N  Using the Toilet? - N  In the past six months, have you accidently leaked  urine? - N  Do you have problems with loss of bowel control? - N  Managing your Medications? - N  Managing your Finances? - N  Housekeeping or managing your Housekeeping? - N  Some recent data might be hidden    Fall/Depression Screening:    [facility] CM Care Plan Problem One   Flowsheet Row Most Recent Value  Care Plan Problem One  Patient was recently hospitalized for afbirillation  Role Documenting the Problem One  Care Management [place] for Problem One  Active  [facility] Long Term Goal (31-90 days)  In the next 31 days, pateint will be compliant with all medical appoitnemnts at 100% rate   [facility]N Long Term Goal Start Date  02/09/16  [facility] Long Term Goal Met Date  03/23/16  Interventions for Problem One Long Term Goal  home visit for review ofcaremanagmentgoals,caseclosure  [facility] CM Short Term Goal #1 (0-30 days)  In the next 14 days, patient will meet with [facility] RNCM for atrial fibrillation educatio  [facility]N CM Short Term Goal #1 Start Date  02/09/16  [facility] CM Short Term Goal #1 Met Date  02/24/16  Interventions for Short Term Goal #1  assessment of community care coordination needs. [patient reports receiving call from [place]  CM Care Plan Problem Two   Flowsheet Row Most Recent Value  Care Plan Problem Two  patient is on new medications to assist with managmeent ofatrial fibrillation  Role Documenting the Problem Two  Care Management Coordinator  Care Plan for Problem Two  Active  [facility] CM Short Term Goal #1 (0-30 days)  In the next 30 days, patient will meet with a [facility] RNCM for medication reconcillation/education  [facility]N CM Short Term Goal #1 Start Date  02/09/16  [facility] CM Short Term Goal #1 Met Date   03/02/16  Interventions for Short Term Goal #2   (P) discharge home visit to revewe case management goals, updated case managment care plan     Fall Risk  03/23/2016 03/17/2016 03/02/2016 01/14/2016 07/05/2012  Falls in the past year? No No No No No  Risk for fall due to : Impaired  balance/gait;Impaired mobility;Impaired vision - Impaired balance/gait;Impaired mobility;Impaired vision;Medication side effect Impaired balance/gait;Impaired mobility;Impaired vision;Medication side effect -   PHQ 2/9 Scores 03/23/2016 03/17/2016 03/02/2016 01/14/2016 07/05/2012  PHQ - 2 Score 0 0 0 0 0   03/23/16.   Assessment:   Patient reports compliance with medication and medical appointments. Patient has very strong family support, grandson and daughter, [name]. [name] provides transportation. Patient is able to afford medication and has sound knowledge base related to medications. RNCM and patient reviewed her case management goals, patient acknowledges meeting goals.  Patient agrees to graduating from [facility] case management at this time. RNCM reviewed contact information for [facility] for self referral in the future a case management needs arise.   Plan:  Discharge patient from caseload. Send patient and primary care provider discharge notification.

## 2016-03-24 DIAGNOSIS — I5033 Acute on chronic diastolic (congestive) heart failure: Secondary | ICD-10-CM | POA: Diagnosis not present

## 2016-03-24 DIAGNOSIS — N183 Chronic kidney disease, stage 3 (moderate): Secondary | ICD-10-CM | POA: Diagnosis not present

## 2016-03-24 DIAGNOSIS — Z87891 Personal history of nicotine dependence: Secondary | ICD-10-CM | POA: Diagnosis not present

## 2016-03-24 DIAGNOSIS — I130 Hypertensive heart and chronic kidney disease with heart failure and stage 1 through stage 4 chronic kidney disease, or unspecified chronic kidney disease: Secondary | ICD-10-CM | POA: Diagnosis not present

## 2016-03-24 DIAGNOSIS — I2510 Atherosclerotic heart disease of native coronary artery without angina pectoris: Secondary | ICD-10-CM | POA: Diagnosis not present

## 2016-03-24 DIAGNOSIS — I4891 Unspecified atrial fibrillation: Secondary | ICD-10-CM | POA: Diagnosis not present

## 2016-03-25 ENCOUNTER — Telehealth: Payer: Self-pay

## 2016-03-25 NOTE — Telephone Encounter (Signed)
Tracy from [facility] requesting VO. Please call back.  

## 2016-03-25 NOTE — Telephone Encounter (Signed)
[facility] let us know that pt is advancing very well at home with her care/medications and will be discharge early after next visit which will be Sept 6th.

## 2016-03-26 NOTE — Telephone Encounter (Signed)
Sounds good. Thanks. Dr. Danella Pentonruong

## 2016-03-31 ENCOUNTER — Ambulatory Visit (INDEPENDENT_AMBULATORY_CARE_PROVIDER_SITE_OTHER): Payer: Medicare Other | Admitting: Internal Medicine

## 2016-03-31 ENCOUNTER — Encounter: Payer: Self-pay | Admitting: Internal Medicine

## 2016-03-31 VITALS — BP 120/68 | HR 68 | Ht 63.0 in | Wt 154.0 lb

## 2016-03-31 DIAGNOSIS — I480 Paroxysmal atrial fibrillation: Secondary | ICD-10-CM

## 2016-03-31 DIAGNOSIS — I10 Essential (primary) hypertension: Secondary | ICD-10-CM

## 2016-03-31 DIAGNOSIS — I2510 Atherosclerotic heart disease of native coronary artery without angina pectoris: Secondary | ICD-10-CM

## 2016-03-31 DIAGNOSIS — Z9861 Coronary angioplasty status: Secondary | ICD-10-CM | POA: Diagnosis not present

## 2016-03-31 DIAGNOSIS — R5383 Other fatigue: Secondary | ICD-10-CM

## 2016-03-31 DIAGNOSIS — N183 Chronic kidney disease, stage 3 unspecified: Secondary | ICD-10-CM

## 2016-03-31 NOTE — Patient Instructions (Signed)
Your physician wants you to follow-up in: 6 months with Dr. Hilty. You will receive a reminder letter in the mail two months in advance. If you don't receive a letter, please call our office to schedule the follow-up appointment.    

## 2016-03-31 NOTE — Progress Notes (Signed)
OFFICE NOTE  Chief Complaint:  "I'm so tired"  Primary Care Physician: Gara Kroner, MD  HPI:  [name] is a 82 y.o. 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  Cardiac cath was done in 2002. Mid RCA 85% lesion was treated with a bare metal stent. She had moderate nonobstructive disease in LAD and diagonal treated medically. She's been seen by different cardiologists during past admissions but has never followed up in the office.   Most recently admitted 5/18-6/1 with community-acquired pneumococcal pneumonia complicated by AF with RVR and non-STEMI.  Echo demonstrated normal LV function with an EF of 55-60%. Troponin peaked at 1.26. On rate control therapy, she converted to NSR.  Pneumonia was complicated by right pleural effusion. She required thoracentesis x 2. This was likely exudative. Cytology was neg for malignancy.  Initially, cardiac catheterization was considered. Nephrology also followed the patient. Her creatinine increased but did improve prior to DC.  Given her prior renal transplant, it was felt that cardiac catheterization should be avoided. It was thought that her elevated troponin was likely related to demand ischemia.  Inpatient nuclear stress test was arranged.  This was low risk and neg for ischemia.  Med Rx was recommended.  CHADS2-VASc=7 (age > 96, female, HTN, DM, CHF, CAD).  Decision was made to start chronic anticoagulation with Eliquis.    She was seen in FU by Pulmonology Rubye Oaks, NP) on 01/12/16.  Patient had re-accumulation of R pleural effusion.  She was set up for repeat thoracentesis which was done 6/16.   she is also been on chronic oxygen therapy with 2 L by nasal cannula.  03/01/2016  [name] was seen here today in follow-up. Recently she was seen by her home health nurse and noted she been bradycardiac and reports. The time were heart rate is  very low associated with low blood pressure and fatigue. Heart rate today is in the low 50s. She was placed on amiodarone and maintained on metoprolol for recent admission for atrial fibrillation and rapid ventricular response. She seems to be maintaining a normal rhythm. She was also placed on Eliquis for anticoagulation and remains on low-dose aspirin. She was noted to be only on 1/2 L per minute of oxygen and I instructed her on how to properly set her regulator.  03/31/2016  [name] was seen back in follow-up today. Again she feels significantly fatigued, no energy and generally says that she has not been herself over the past year. She is now on a correct amount of oxygen and I decreased her beta blocker to allow higher heart rate. Heart rate today is in the mid 60s and blood pressure 120/68 therefore those lower numbers did not seem to affect her energy level. She had stress testing in May of this year as well as an echocardiogram which showed normal LV function and no reversible ischemia. There was mild to moderate pulmonary hypertension. I do not believe the cardiac findings would support her symptoms. It is feasible that she could have more severe coronary disease then is captured by stress testing, but she is not a candidate for heart catheterization at this point due to severe chronic kidney disease and a high risk of proceeding to dialysis again if she were to have heart catheterization.  PMHx:  Past Medical History:  Diagnosis Date  . Candidal esophagitis (HCC)  a. 03/2014.  Marland Kitchen. Cholelithiasis   . Chronic diastolic CHF (congestive heart failure) (HCC)    a. 06/2011 Echo: EF 60-65%, no rwma, Gr1 DD, mild TR. // b. Echo 12/19/15: Severe LVH, EF 55-60%, normal wall motion, mild MR, moderate LAE, moderate TR, PASP 44 mmHg  . CKD (chronic kidney disease), stage III    a. in setting of prior ESRD and cadaveric tx in 2003.  Marland Kitchen. Coronary artery disease    a. 01/2001 Cath/PCI: LAD 50p, 7937m, D1 50-60, RCA  4843m (3.0x13 BX Velocity BMS).// b. NSTEMI 5/17 (demand ischemia) in setting of AF with RVR, pneumonia, CKD, a/c HF >> Myoview 12/26/15: prob inf infarct, no ischemia, EF 52%, Low Risk >> med Rx  . Gastritis    a. 03/2014  . Gout    unconfirmed by joint aspiration  . History of bacteremia    a. 08/2004 Group A Strep bacteremia.  . Hypertensive heart disease   . S/P kidney transplant    a. due to FSGN, follows with Dr. Vivia BirminghamMattingly-->Transplant in 2003. Was on HD prior to that.  . Type II diabetes mellitus (HCC)     Past Surgical History:  Procedure Laterality Date  . ABDOMINAL HYSTERECTOMY    . ESOPHAGOGASTRODUODENOSCOPY N/A 03/20/2014   Procedure: ESOPHAGOGASTRODUODENOSCOPY (EGD);  Surgeon: Theda BelfastPatrick D Hung, MD;  [facility];  Service: Endoscopy;  Laterality: N/A;  . KIDNEY TRANSPLANT  2003    FAMHx:  Family History  Problem Relation Age of Onset  . CAD Father   . CAD Brother   . CAD      Both parents and multiple siblings have died from coronary disease prior to age 80 and several in their 5640's and 7650's.    SOCHx:   reports that she quit smoking about 46 years ago. She has never used smokeless tobacco. She reports that she does not drink alcohol or use drugs.  ALLERGIES:  Allergies  Allergen Reactions  . Sulfa Drugs Cross Reactors Swelling    ROS: Pertinent items noted in HPI and remainder of comprehensive ROS otherwise negative.  HOME MEDS: Current Outpatient Prescriptions  Medication Sig Dispense Refill  . allopurinol (ZYLOPRIM) 100 MG tablet Take 100 mg by mouth at bedtime.    Marland Kitchen. amiodarone (PACERONE) 200 MG tablet Take 1 tablet (200 mg total) by mouth 2 (two) times daily. 60 tablet 12  . apixaban (ELIQUIS) 5 MG TABS tablet Take 1 tablet (5 mg total) by mouth 2 (two) times daily. 180 tablet 3  . aspirin EC 81 MG tablet Take 81 mg by mouth daily.    . cinacalcet (SENSIPAR) 30 MG tablet Take 1 tablet by mouth 2 (two) times a week.    . cinacalcet (SENSIPAR) 90 MG  tablet Take 30 mg by mouth daily.     . famotidine (PEPCID) 20 MG tablet Take 20 mg by mouth 2 (two) times daily.    . furosemide (LASIX) 80 MG tablet Take 80 mg by mouth 2 (two) times daily.    . metoprolol tartrate (LOPRESSOR) 25 MG tablet Take 1 tablet (25 mg total) by mouth 2 (two) times daily. (Patient taking differently: Take 12.5 mg by mouth 2 (two) times daily. ) 60 tablet   . mycophenolate (CELLCEPT) 250 MG capsule Take 500 mg by mouth 2 (two) times daily.    . tacrolimus (PROGRAF) 0.5 MG capsule 1 mg in the morning and 1 mg at bedtime (Patient taking differently: Take 1.5 mg by mouth 2 (two) times daily. ) 120  capsule 0   No current facility-administered medications for this visit.     LABS/IMAGING: No results found for this or any previous visit (from the past 48 hour(s)). No results found.  WEIGHTS: Wt Readings from Last 3 Encounters:  03/31/16 154 lb (69.9 kg)  03/17/16 153 lb 12.8 oz (69.8 kg)  03/12/16 154 lb 3.2 oz (69.9 kg)    VITALS: BP 120/68   Pulse 68   Ht 5\' 3"  (1.6 m)   Wt 154 lb (69.9 kg)   BMI 27.28 kg/m   EXAM: Deferred  EKG: Deferred  ASSESSMENT: 1. Coronary artery disease with history of BMS to the RCA in 2002 2. End-stage renal disease status post renal transplant secondary to FSGN 3. Hypertensive heart disease 4. Chronic diastolic heart failure 5. Recent atrial fibrillation 6. Gout 7. Type 2 diabetes 8. Pleural effusion/bilateral infiltrates  PLAN: 1.   [name] continues to have significant fatigue that I don't believe is related to coronary disease. It may be related to worsening renal disease, atrial fibrillation, or other etiologies such as anemia. Recent blood count however was only mildly reduced. I encouraged her to follow-up with her primary care provider for further workup of her ongoing fatigue. Plan to continue her current medications as they stand.  Chrystie Nose, MD, [facility] Attending Cardiologist [facility]  Chrystie Nose 03/31/2016, 5:38 PM

## 2016-04-07 ENCOUNTER — Telehealth: Payer: Self-pay

## 2016-04-07 DIAGNOSIS — N183 Chronic kidney disease, stage 3 (moderate): Secondary | ICD-10-CM | POA: Diagnosis not present

## 2016-04-07 DIAGNOSIS — I5033 Acute on chronic diastolic (congestive) heart failure: Secondary | ICD-10-CM | POA: Diagnosis not present

## 2016-04-07 DIAGNOSIS — I2510 Atherosclerotic heart disease of native coronary artery without angina pectoris: Secondary | ICD-10-CM | POA: Diagnosis not present

## 2016-04-07 DIAGNOSIS — I4891 Unspecified atrial fibrillation: Secondary | ICD-10-CM | POA: Diagnosis not present

## 2016-04-07 DIAGNOSIS — Z87891 Personal history of nicotine dependence: Secondary | ICD-10-CM | POA: Diagnosis not present

## 2016-04-07 DIAGNOSIS — I130 Hypertensive heart and chronic kidney disease with heart failure and stage 1 through stage 4 chronic kidney disease, or unspecified chronic kidney disease: Secondary | ICD-10-CM | POA: Diagnosis not present

## 2016-04-07 NOTE — Telephone Encounter (Signed)
[facility] - stated pt is doing well and has been discharged from their service.

## 2016-04-07 NOTE — Telephone Encounter (Signed)
[facility]acy [facility] the nurse to call back regarding visit with the pt.

## 2016-04-26 ENCOUNTER — Encounter: Payer: Self-pay | Admitting: Internal Medicine

## 2016-04-26 ENCOUNTER — Ambulatory Visit (INDEPENDENT_AMBULATORY_CARE_PROVIDER_SITE_OTHER): Payer: Medicare Other | Admitting: Internal Medicine

## 2016-04-26 ENCOUNTER — Ambulatory Visit (INDEPENDENT_AMBULATORY_CARE_PROVIDER_SITE_OTHER)
Admission: RE | Admit: 2016-04-26 | Discharge: 2016-04-26 | Disposition: A | Discharge status: Home / Self Care | Payer: Medicare Other | Source: Ambulatory Visit | Attending: Internal Medicine | Admitting: Internal Medicine

## 2016-04-26 VITALS — BP 104/70 | HR 53 | Ht 63.0 in | Wt 151.2 lb

## 2016-04-26 DIAGNOSIS — J9611 Chronic respiratory failure with hypoxia: Secondary | ICD-10-CM | POA: Diagnosis not present

## 2016-04-26 DIAGNOSIS — I2510 Atherosclerotic heart disease of native coronary artery without angina pectoris: Secondary | ICD-10-CM

## 2016-04-26 DIAGNOSIS — Z9861 Coronary angioplasty status: Secondary | ICD-10-CM | POA: Diagnosis not present

## 2016-04-26 DIAGNOSIS — J90 Pleural effusion, not elsewhere classified: Secondary | ICD-10-CM | POA: Diagnosis not present

## 2016-04-26 DIAGNOSIS — R05 Cough: Secondary | ICD-10-CM | POA: Diagnosis not present

## 2016-04-26 MED ORDER — AMOXICILLIN-POT CLAVULANATE 875-125 MG PO TABS
1.0000 | ORAL_TABLET | Freq: Two times a day (BID) | ORAL | 0 refills | Status: AC
Start: 2016-04-26 — End: 2016-05-06

## 2016-04-26 NOTE — Patient Instructions (Addendum)
We will set you up for Repeat thoracentesis 9/28/ or 9/29 at the latest and thoracic surgery evaluation   Augmentin 875 mg take one pill twice daily  X 10 days - take at breakfast and supper with large glass of water.  It would help reduce the usual side effects (diarrhea and yeast infections) if you ate cultured yogurt at lunch.   You will need need to stop the eliquis x 3 days before the thoracentesis

## 2016-04-26 NOTE — Progress Notes (Signed)
Subjective:     Patient ID: [name], female   DOB: [birth year],  .   MRN: [id number]    History of Present Illness [age] yobf minimal smoking quit around 1970  with seen for pulmonary consult during hospitalization May 2017 with a pneumococcal pneumonia with associated bacteremia and acute hypoxic respiratory failure. She has stage II chronic kidney disease status post renal transplant, diabetes, chronic diastolic congestive heart failure and atrial fib with RVR, on Eliquis.   History of Present Illness  02/12/2016 Pneumonia/Pleural Effusion Follow Up: Pt. Presents  for follow up for pleural effusion and pneumonia diagnosed  12/18/2015. Patient was admitted May 2017 for pneumococcal pneumonia with associated bacteremia and acute hypoxic respiratory failure. Hospitalization was compensated by atrial filb with RVR and NSTEMI demand ischemia. She was seen for pulmonary consult during this hospitalization. She was found to have a large right exudative pleural effusion-felt to be parapneumonic. She underwent thoracentesis times 2 with 1460 cc and then 800cc . Cytology neg for malignant cells.  Initial CT chest on May 23 showed a large right pleural effusion with right lower lobe collapse Repeat CT chest on May 31 showed complete atelectasis of the right lower lobe with significant atelectasis of the right middle lobe. The proximal right middle lobe bronchus appears occluded. Small to moderate right pleural effusion is decreased from previous CT scan. She was treated with antibiotics and diuretics were adjusted. . 2 D echo showed w/ EF 55%, mod LA dilation, mod TV regurg, PAP 44.  She was recently hospitalized July 4th for atrial fib with RVR. She did not want cardioversion. She has been compliant with her Eliquis. CXR today indicates continued atelectasis or infiltrate. Pt. States she continues to have shortness of  breath with activity.She is using her oxygen at 2L [place] 24/7. She is interested in weaning  off the oxygen, but I told her that since she continues to have dyspnea, we need to continue oxygen therapy until she is better.She states she continues to cough up green secretions with some blood. She states she has been coughing up blood since before her first hospital admission. She denies fever, chest pain, or orthopnea, leg or calf pain or tenderness. rec augmentin x 7 more days    03/12/2016  f/u ov/[name] re: s/p pneumocal pna/ sepsis/ R>>L effusions Chief Complaint  Patient presents with  . Follow-up    CXR today. Pt states she has right lateral rib pain when moving. Pt denies pain on left lateral rib side. Pt c/o fatigue. Pt denies cough, f/c/s and CP/tightness.   Overall improved with breathing and no cough or pleuritic cp. She has a new R flank discomfort that is exquistely positional, better on tylenol, "feels like she pullned a muscle with acute onset 3 d prior to OV  And no assoc n or v or anorexia. Sob or fever or rash or change with breath or cough. rec Please remember to go to the lab   department downstairs for your tests - we will call you with the results when they are available. Stay on 02  But reduce to 1lpm    04/26/2016  f/u ov/[name] re:  Chief Complaint  Patient presents with  . Follow-up    CXR done today. Pt states cough has been worse with large amounts of thick, blood streaked sputum for the past wk.   sob x 50 ft  Sleeps on 2 pillows and no worse than usual Cough worse x one month  Congested  sometimes green esp in am    No obvious day to day or daytime variability or assoc excess/ purulent sputum or mucus plugs or hemoptysis or  chest tightness, subjective wheeze or overt sinus or hb symptoms. No unusual exp hx or h/o childhood pna/ asthma or knowledge of premature birth.  Sleeping ok without nocturnal  or early am exacerbation  of respiratory  c/o's or need for noct saba. Also denies any obvious fluctuation of symptoms with weather or environmental changes or  other aggravating or alleviating factors except as outlined above   Current Medications, Allergies, Complete Past Medical History, Past Surgical History, Family History, and Social History were reviewed in [employer] record.  ROS  The following are not active complaints unless bolded sore throat, dysphagia, dental problems, itching, sneezing,  nasal congestion or excess/ purulent secretions, ear ache,   fever, chills, sweats, unintended wt loss, classically pleuritic or exertional cp,  orthopnea pnd or leg swelling, presyncope, palpitations, abdominal pain, anorexia, nausea, vomiting, diarrhea  or change in bowel or bladder habits, change in stools or urine, dysuria,hematuria,  rash, arthralgias, visual complaints, headache, numbness, weakness or ataxia or problems with walking or coordination,  change in mood/affect or memory.           Significant Events: 12/19/2015>>2 D echo showed w/ EF 55%, mod LA dilation, mod TV regurg, PAP 44.  12/23/15>> CT scan on 5/23 showed showed a large right pleural effusion with right lower lobe collapse and patchy airspace disease of the left lower lobe, finding suspicion for aspiration pneumonia, endobronchial lesion causing collapse of the right lower lobe is also in the differential diagnosis    12/24/15>> R Thorocentesis>> 1640 cc clear yellow, most likely exudative by LDH. ( 10 days IV Rocephin) 12/28/15>> Reaccumulation>> parapneumonic effusion and had another 882mL's of pleural fluid removed( ABX resumed) 12/31/15>> Repeat CT scan of the chest without contrast on 5/31 showed no convincing right lower lobe mass, small to moderate right pleural effusion. Pulmonary recommended aggressive diuresis to help manage her pleural effusion Patient is also being sent home on another 5 days of Augmentin. 01/16/16>>Thorocentesis>> 1 Liter + amber yellow pleural fluid> Wbc 230 (down from prev) with 95% L/ transudative chem               Objective:   Physical Exam    W/c bound elderly bf = stated age in appearance  Wt Readings from Last 3 Encounters:  03/12/16 154 lb 3.2 oz (69.9 kg)  03/01/16 156 lb (70.8 kg)  02/12/16 156 lb (70.8 kg)    Vital signs reviewed - 02 sats 96% on RA  General- No distress,  A&Ox3, frail elderly female in a wheel chair. ENT: No sinus tenderness, TM clear, pale nasal mucosa, no oral exudate,no post nasal drip, no LAN Cardiac: S1, S2, regular rate and rhythm, no murmur Chest: No wheeze/ rales/ diminished  Base on R   with dullnes ; no accessory muscle use, no nasal flaring, no sternal retractions Abd.: Soft Non-tender Ext: No clubbing cyanosis, edema Neuro:  normal strength Skin: No rashes, warm and dry Psych: normal mood and behavior   CXR PA and Lateral:   04/26/2016 :    I personally reviewed images and agree with radiology impression as follows:    1. Increased right pleural effusion, now moderate to large in size. 2. Left lung is clear. 3. Aortic atherosclerosis.     Labs reviewed:      Chemistry  Component Value Date/Time   NA 136 02/05/2016 0400   NA 138 01/19/2016 1155   K 3.5 02/05/2016 0400   CL 99 (L) 02/05/2016 0400   CO2 26 02/05/2016 0400   BUN 24 (H) 02/05/2016 0400   BUN 22 01/19/2016 1155   CREATININE 1.45 (H) 02/05/2016 0400      Component Value Date/Time   CALCIUM 9.6 02/05/2016 0400   ALKPHOS 84 01/01/2016 0345   AST 19 01/01/2016 0345   ALT 12 (L) 01/01/2016 0345   BILITOT 0.4 01/01/2016 0345        Lab Results  Component Value Date   WBC 7.9 03/12/2016   HGB 11.8 (L) 03/12/2016   HCT 35.9 (L) 03/12/2016   MCV 87.3 03/12/2016   PLT 241.0 03/12/2016        Lab Results  Component Value Date   TSH 2.077 12/18/2015      Lab Results  Component Value Date   ESRSEDRATE 93 (H) 03/12/2016       Assessment:

## 2016-04-27 ENCOUNTER — Encounter: Payer: Self-pay | Admitting: Internal Medicine

## 2016-04-27 NOTE — Assessment & Plan Note (Signed)
12/19/2015>>2 D echo showed w/ EF 55%, mod LA dilation, mod TV regurg, PAP 44.  12/23/15>> CT scan on 5/23 showed showed a large right pleural effusion with right lower lobe collapse and patchy airspace disease of the left lower lobe, finding suspicion for aspiration pneumonia, endobronchial lesion causing collapse of the right lower lobe is also in the differential diagnosis    12/24/15>> R Thorocentesis>> 1640 cc clear yellow, most likely exudative by LDH. ( 10 days IV Rocephin) 12/28/15>> Reaccumulation>> parapneumonic effusion and had another 84180mL's of pleural fluid removed( ABX resumed) 12/31/15>> Repeat CT scan of the chest without contrast on 5/31 showed no convincing right lower lobe mass, small to moderate right pleural effusion. Pulmonary recommended aggressive diuresis to help manage her pleural effusion Patient d/c  another 5 days of Augmentin. 01/16/16>>R Thorocentesis>> 1 Liter + amber yellow pleural fluid Wbc 230 (down from prev) with 95% L/ transudative chem - 04/26/2016 reaccumulation despite nl BNP > rec repeat Tap dry and T surgery eval  Discussed in detail all the  indications, usual  risks and alternatives  relative to the benefits with patient who agrees to proceed with wu as outlined    I had an extended discussion with the patient reviewing all relevant studies completed to date and  lasting 15 to 20 minutes of a 25 minute visit    Each maintenance medication was reviewed in detail including most importantly the difference between maintenance and prns and under what circumstances the prns are to be triggered using an action plan format that is not reflected in the computer generated alphabetically organized AVS.    Please see instructions for details which were reviewed in writing and the patient given a copy highlighting the part that I personally wrote and discussed at today's ov.

## 2016-04-27 NOTE — Assessment & Plan Note (Signed)
rx with 02 at d/c from strep pna May 2017  =As of 03/12/2016 ok on 1lpm 24/7  - 04/26/2016 sats ok RA at rest > rec continue 02 at hs only and prn daytime

## 2016-04-30 ENCOUNTER — Ambulatory Visit (HOSPITAL_COMMUNITY)
Admission: RE | Admit: 2016-04-30 | Discharge: 2016-04-30 | Disposition: A | Discharge status: Home / Self Care | Payer: Medicare Other | Source: Ambulatory Visit | Attending: Radiology | Admitting: Radiology

## 2016-04-30 ENCOUNTER — Ambulatory Visit (HOSPITAL_COMMUNITY)
Admission: RE | Admit: 2016-04-30 | Discharge: 2016-04-30 | Disposition: A | Discharge status: Home / Self Care | Payer: Medicare Other | Source: Ambulatory Visit | Attending: Internal Medicine | Admitting: Internal Medicine

## 2016-04-30 DIAGNOSIS — Z9889 Other specified postprocedural states: Secondary | ICD-10-CM

## 2016-04-30 DIAGNOSIS — J90 Pleural effusion, not elsewhere classified: Secondary | ICD-10-CM

## 2016-04-30 DIAGNOSIS — R846 Abnormal cytological findings in specimens from respiratory organs and thorax: Secondary | ICD-10-CM | POA: Diagnosis not present

## 2016-04-30 LAB — PROTEIN, BODY FLUID

## 2016-04-30 LAB — LACTATE DEHYDROGENASE, PLEURAL OR PERITONEAL FLUID: LD, Fluid: 53 U/L — ABNORMAL HIGH (ref 3–23)

## 2016-04-30 LAB — BODY FLUID CELL COUNT WITH DIFFERENTIAL
Eos, Fluid: 2 %
LYMPHS FL: 58 %
Monocyte-Macrophage-Serous Fluid: 36 % — ABNORMAL LOW (ref 50–90)
Neutrophil Count, Fluid: 4 % (ref 0–25)
Total Nucleated Cell Count, Fluid: 388 cu mm (ref 0–1000)

## 2016-04-30 LAB — GLUCOSE, SEROUS FLUID: GLUCOSE FL: 119 mg/dL

## 2016-04-30 NOTE — Procedures (Signed)
Ultrasound-guided diagnostic and therapeutic right thoracentesis performed yielding 900 cc of yellow fluid. No immediate complications. Follow-up chest x-ray pending.The fluid was sent to the lab for preordered studies. No sig left pleural effusion noted by US today.

## 2016-05-01 LAB — TRIGLYCERIDES, BODY FLUIDS: TRIGLYCERIDES FL: 10 mg/dL

## 2016-05-01 LAB — AMYLASE, PLEURAL FLUID: Amylase, Pleural Fluid: 15 U/L

## 2016-05-03 NOTE — Progress Notes (Signed)
Spoke with pt and notified of results per Dr. Wert. Pt verbalized understanding and denied any questions. 

## 2016-05-06 ENCOUNTER — Ambulatory Visit (INDEPENDENT_AMBULATORY_CARE_PROVIDER_SITE_OTHER): Payer: Medicare Other | Admitting: Internal Medicine

## 2016-05-06 ENCOUNTER — Ambulatory Visit (INDEPENDENT_AMBULATORY_CARE_PROVIDER_SITE_OTHER)
Admission: RE | Admit: 2016-05-06 | Discharge: 2016-05-06 | Disposition: A | Discharge status: Home / Self Care | Payer: Medicare Other | Source: Ambulatory Visit | Attending: Internal Medicine | Admitting: Internal Medicine

## 2016-05-06 ENCOUNTER — Encounter (INDEPENDENT_AMBULATORY_CARE_PROVIDER_SITE_OTHER): Payer: Self-pay

## 2016-05-06 ENCOUNTER — Encounter: Payer: Self-pay | Admitting: Internal Medicine

## 2016-05-06 VITALS — BP 122/68 | HR 72 | Ht 63.0 in | Wt 151.8 lb

## 2016-05-06 DIAGNOSIS — J90 Pleural effusion, not elsewhere classified: Secondary | ICD-10-CM

## 2016-05-06 DIAGNOSIS — I2510 Atherosclerotic heart disease of native coronary artery without angina pectoris: Secondary | ICD-10-CM

## 2016-05-06 DIAGNOSIS — J9611 Chronic respiratory failure with hypoxia: Secondary | ICD-10-CM | POA: Diagnosis not present

## 2016-05-06 DIAGNOSIS — Z9861 Coronary angioplasty status: Secondary | ICD-10-CM

## 2016-05-06 NOTE — Progress Notes (Signed)
Spoke with [name] and notified of results per Dr. Wert. [name] verbalized understanding and denied any questions. 

## 2016-05-06 NOTE — Patient Instructions (Addendum)
Please remember to go to the  x-ray department downstairs for your tests - we will call you with the results when they are available.  Least invasive procedure is to adjust your diuretics to pull all the fluid out (add aldactone) and I will let Dr Ivor MessierMattlingly know why this is needed   [facility] at 715 am Wednesday 05/12/16  with nothing to eat or drink after midnight Tuesday .for Bronchoscopy and no eliquis after Monday morning's dose

## 2016-05-06 NOTE — Assessment & Plan Note (Signed)
rx with 02 at d/c from strep pna May 2017  =As of 03/12/2016 ok on 1lpm 24/7  - 05/06/2016 sats ok RA at rest > rec continue 02 at hs only and prn daytime

## 2016-05-06 NOTE — Progress Notes (Signed)
Subjective:     Patient ID: Joy [name], female   DOB: [birth year],  .   MRN: [id number]    History of Present Illness [age] yobf minimal smoking quit around 1970  with seen for pulmonary consult during hospitalization May 2017 with a pneumococcal pneumonia with associated bacteremia and  R effusion / acute hypoxic respiratory failure. She has stage II chronic kidney disease status post renal transplant, diabetes, chronic diastolic congestive heart failure and atrial fib with RVR, on Eliquis.   History of Present Illness  02/12/2016 Pneumonia/Pleural Effusion Follow Up: Pt. Presents  for follow up for pleural effusion and pneumonia diagnosed  12/18/2015. Patient was admitted May 2017 for pneumococcal pneumonia with associated bacteremia and acute hypoxic respiratory failure. Hospitalization was compensated by atrial filb with RVR and NSTEMI demand ischemia. She was seen for pulmonary consult during this hospitalization. She was found to have a large right exudative pleural effusion-felt to be parapneumonic. She underwent thoracentesis times 2 with 1460 cc and then 800cc . Cytology neg for malignant cells.  Initial CT chest on May 23 showed a large right pleural effusion with right lower lobe collapse Repeat CT chest on May 31 showed complete atelectasis of the right lower lobe with significant atelectasis of the right middle lobe. The proximal right middle lobe bronchus appears occluded. Small to moderate right pleural effusion is decreased from previous CT scan. She was treated with antibiotics and diuretics were adjusted. . 2 D echo showed w/ EF 55%, mod LA dilation, mod TV regurg, PAP 44.  She was recently hospitalized July 4th for atrial fib with RVR. She did not want cardioversion. She has been compliant with her Eliquis. CXR today indicates continued atelectasis or infiltrate. Pt. States she continues to have shortness of  breath with activity.She is using her oxygen at 2L [place] 24/7. She is  interested in weaning off the oxygen, but I told her that since she continues to have dyspnea, we need to continue oxygen therapy until she is better.She states she continues to cough up green secretions with some blood. She states she has been coughing up blood since before her first hospital admission. She denies fever, chest pain, or orthopnea, leg or calf pain or tenderness. rec augmentin x 7 more days    03/12/2016  f/u ov/Nyxon Strupp re: s/p pneumocal pna/ sepsis/ R>>L effusions Chief Complaint  Patient presents with  . Follow-up    CXR today. Pt states she has right lateral rib pain when moving. Pt denies pain on left lateral rib side. Pt c/o fatigue. Pt denies cough, f/c/s and CP/tightness.   Overall improved with breathing and no cough or pleuritic cp. She has a new R flank discomfort that is exquistely positional, better on tylenol, "feels like she pullned a muscle with acute onset 3 d prior to OV  And no assoc n or v or anorexia. Sob or fever or rash or change with breath or cough. rec Please remember to go to the lab   department downstairs for your tests - we will call you with the results when they are available. Stay on 02  But reduce to 1lpm    04/26/2016  f/u ov/Ahron Hulbert re:  Chief Complaint  Patient presents with  . Follow-up    CXR done today. Pt states cough has been worse with large amounts of thick, blood streaked sputum for the past wk.   sob x 50 ft  Sleeps on 2 pillows and no worse than usual Cough worse x  one month  Congested sometimes green esp in am  rec  Augmentin 875 mg take one pill twice daily  X 10 days -  You will need need to stop the eliquis x 3 days before the thoracentesis done 04/29/16 x 900 cc transudate with good reexpansion of RLL on f/u cxr     05/06/2016  f/u ov/Alaine Loughney re:  R pleural effusion  Chief Complaint  Patient presents with  . Follow-up    Breathing improved after thoracentesis 04/30/16, and slowly worsening over the past 2 days. She feels very  tired today.   never had orthopnea, either before or after tap. Green mucus resolved but now sltly bloody am mucus on eliquis    No obvious day to day or daytime variability or assoc excess/ purulent sputum or mucus plugs or  chest tightness, subjective wheeze or overt sinus or hb symptoms. No unusual exp hx or h/o childhood pna/ asthma or knowledge of premature birth.  Sleeping ok without nocturnal  or early am exacerbation  of respiratory  c/o's or need for noct saba. Also denies any obvious fluctuation of symptoms with weather or environmental changes or other aggravating or alleviating factors except as outlined above   Current Medications, Allergies, Complete Past Medical History, Past Surgical History, Family History, and Social History were reviewed in [facility] electronic medical record.  ROS  The following are not active complaints unless bolded sore throat, dysphagia, dental problems, itching, sneezing,  nasal congestion or excess/ purulent secretions, ear ache,   fever, chills, sweats, unintended wt loss, classically pleuritic or exertional cp,  orthopnea pnd or leg swelling improved if keeps legs elevated , presyncope, palpitations, abdominal pain, anorexia, nausea, vomiting, diarrhea  or change in bowel or bladder habits, change in stools or urine, dysuria,hematuria,  rash, arthralgias, visual complaints, headache, numbness, weakness or ataxia or problems with walking or coordination,  change in mood/affect or memory.          Significant events/studies:  12/19/2015>>2 D echo showed w/ EF 55%, mod LA dilation, mod TV regurg, PAP 44.  12/23/15>> CT scan on 5/23 showed showed a large right pleural effusion with right lower lobe collapse and patchy airspace disease of the left lower lobe, finding suspicion for aspiration pneumonia, endobronchial lesion causing collapse of the right lower lobe is also in the differential diagnosis    12/24/15>> R Thorocentesis>> 1640 cc clear  yellow, most likely exudative by LDH. ( 10 days IV Rocephin) 12/28/15>> Reaccumulation>> R parapneumonic effusion and had another 85280mL's of pleural fluid removed( ABX resumed) 12/31/15>> Repeat CT scan of the chest without contrast on 5/31 showed no convincing right lower lobe mass, small to moderate right pleural effusion. Pulmonary recommended aggressive diuresis to help manage her pleural effusion Patient d/c  another 5 days of Augmentin. 01/16/16>>R Thorocentesis>> 1 Liter + amber yellow pleural fluid Wbc 230 (down from prev) with 95% L/ transudative chem - 04/26/2016 reaccumulation despite nl BNP > rec repeat Tap dry and T surgery eval - 04/30/2016  R Tcentesis 900 cc >>  Transudate, neg cytology/ progressive decrease in wbc count > cxr clear/  repeat cxr 05/06/2016  Recurrent mod/large effusion             Objective:   Physical Exam    W/c bound elderly bf = stated age in appearance  05/06/2016       89   03/12/16 154 lb 3.2 oz (69.9 kg)  03/01/16 156 lb (70.8 kg)  02/12/16 156 lb (70.8  kg)    Vital signs reviewed - 02 sats 97% on RA   General- No distress,  A&Ox3, frail elderly female in a wheel chair. ENT: No sinus tenderness, TM clear, pale nasal mucosa, no oral exudate,no post nasal drip, no LAN Cardiac: S1, S2, regular rate and rhythm, no murmur Chest: No wheeze/ rales/ diminished  Base on R   with dullnes ; no accessory muscle use, no nasal flaring, no sternal retractions Abd.: Soft Non-tender Ext: No clubbing cyanosis, - trace bilateral sym pitting ankle edema  Neuro:  normal strength Skin: No rashes, warm and dry Psych: normal mood and behavior        CXR PA and Lateral:   05/06/2016 :    I personally reviewed images and agree with radiology impression as follows:     Increased RIGHT pleural effusion and basilar atelectasis since previous exam.  Enlargement of cardiac silhouette with aortic atherosclerosis.  Underlying COPD changes.       Assessment:

## 2016-05-06 NOTE — Progress Notes (Signed)
Spoke with pt and notified of results per Dr. Wert. Pt verbalized understanding and denied any questions. 

## 2016-05-07 NOTE — Assessment & Plan Note (Addendum)
12/19/2015>>2 D echo showed w/ EF 55%, mod LA dilation, mod TV regurg, PAP 44.  12/23/15>> CT scan on 5/23 showed showed a large right pleural effusion with right lower lobe collapse and patchy airspace disease of the left lower lobe, finding suspicion for aspiration pneumonia, endobronchial lesion causing collapse of the right lower lobe is also in the differential diagnosis    12/24/15>> R Thorocentesis>> 1640 cc clear yellow, most likely exudative by LDH. ( 10 days IV Rocephin) 12/28/15>> Reaccumulation>> R parapneumonic effusion and had another 82380mL's of pleural fluid removed( ABX resumed) 12/31/15>> Repeat CT scan of the chest without contrast on 5/31 showed no convincing right lower lobe mass, small to moderate right pleural effusion. Pulmonary recommended aggressive diuresis to help manage her pleural effusion Patient d/c  another 5 days of Augmentin. 01/16/16>>R Thorocentesis>> 1 Liter + amber yellow pleural fluid Wbc 230 (down from prev) with 95% L/ transudative chem - 04/26/2016 reaccumulation despite nl BNP > rec repeat Tap dry and T surgery eval - 04/30/2016  R Tcentesis 900 cc >>  Transudate, neg cytology/ progressive decrease in wbc count > cxr clear/  repeat cxr 05/06/2016  Recurrent mod/large effusion    Concerned about hemoptysis suggesting there may be an endobronchial process explaining the atx though the pCXR suggests complete re-expansion p tap.  If airways clear next step is vats vs pleurex if can't control effusion with lasix plus ? Aldactone > defer to Dr Briant CedarMattingly    I had an extended discussion with the patient reviewing all relevant studies completed to date and  lasting 15 to 20 minutes of a 25 minute visit    Discussed in detail all the  indications, usual  risks and alternatives  relative to the benefits with patient who agrees to proceed with bronchoscopy to examine RLL airways and look for source of low grade persistent hemoptysis.   Each maintenance medication was  reviewed in detail including most importantly the difference between maintenance and prns and under what circumstances the prns are to be triggered using an action plan format that is not reflected in the computer generated alphabetically organized AVS.    Please see instructions for details which were reviewed in writing and the patient given a copy highlighting the part that I personally wrote and discussed at today's ov.

## 2016-05-12 ENCOUNTER — Encounter (HOSPITAL_COMMUNITY): Payer: Self-pay | Admitting: Respiratory Therapy

## 2016-05-12 ENCOUNTER — Ambulatory Visit (HOSPITAL_COMMUNITY)
Admission: RE | Admit: 2016-05-12 | Discharge: 2016-05-12 | Disposition: A | Discharge status: Home / Self Care | Payer: Medicare Other | Source: Ambulatory Visit | Attending: Internal Medicine | Admitting: Internal Medicine

## 2016-05-12 ENCOUNTER — Encounter (HOSPITAL_COMMUNITY)
Admission: RE | Disposition: A | Discharge status: Home / Self Care | Payer: Self-pay | Source: Ambulatory Visit | Attending: Internal Medicine

## 2016-05-12 DIAGNOSIS — E1122 Type 2 diabetes mellitus with diabetic chronic kidney disease: Secondary | ICD-10-CM | POA: Insufficient documentation

## 2016-05-12 DIAGNOSIS — Z7901 Long term (current) use of anticoagulants: Secondary | ICD-10-CM | POA: Insufficient documentation

## 2016-05-12 DIAGNOSIS — I5032 Chronic diastolic (congestive) heart failure: Secondary | ICD-10-CM | POA: Insufficient documentation

## 2016-05-12 DIAGNOSIS — I252 Old myocardial infarction: Secondary | ICD-10-CM | POA: Diagnosis not present

## 2016-05-12 DIAGNOSIS — R042 Hemoptysis: Secondary | ICD-10-CM | POA: Diagnosis not present

## 2016-05-12 DIAGNOSIS — Z940 Kidney transplant status: Secondary | ICD-10-CM | POA: Insufficient documentation

## 2016-05-12 DIAGNOSIS — J9611 Chronic respiratory failure with hypoxia: Secondary | ICD-10-CM | POA: Diagnosis present

## 2016-05-12 DIAGNOSIS — J90 Pleural effusion, not elsewhere classified: Secondary | ICD-10-CM | POA: Diagnosis not present

## 2016-05-12 DIAGNOSIS — Z9981 Dependence on supplemental oxygen: Secondary | ICD-10-CM | POA: Insufficient documentation

## 2016-05-12 DIAGNOSIS — I4891 Unspecified atrial fibrillation: Secondary | ICD-10-CM | POA: Diagnosis not present

## 2016-05-12 DIAGNOSIS — N182 Chronic kidney disease, stage 2 (mild): Secondary | ICD-10-CM | POA: Insufficient documentation

## 2016-05-12 DIAGNOSIS — R845 Abnormal microbiological findings in specimens from respiratory organs and thorax: Secondary | ICD-10-CM | POA: Diagnosis not present

## 2016-05-12 HISTORY — PX: VIDEO BRONCHOSCOPY: SHX5072

## 2016-05-12 SURGERY — VIDEO BRONCHOSCOPY WITHOUT FLUORO
Anesthesia: Moderate Sedation | Laterality: Bilateral

## 2016-05-12 MED ORDER — LIDOCAINE HCL 1 % IJ SOLN
INTRAMUSCULAR | Status: DC | PRN
  Administered 2016-05-12: 6 mL via RESPIRATORY_TRACT

## 2016-05-12 MED ORDER — MEPERIDINE HCL 100 MG/ML IJ SOLN: 100.0000 mg | Freq: Once | INTRAMUSCULAR | Status: DC

## 2016-05-12 MED ORDER — MIDAZOLAM HCL 10 MG/2ML IJ SOLN
INTRAMUSCULAR | Status: DC | PRN
  Administered 2016-05-12: 2.5 mg via INTRAVENOUS

## 2016-05-12 MED ORDER — LIDOCAINE HCL 2 % EX GEL
CUTANEOUS | Status: DC | PRN
Start: 2016-05-12 — End: 2016-05-12
  Administered 2016-05-12: 1

## 2016-05-12 MED ORDER — PHENYLEPHRINE HCL 0.25 % NA SOLN: 1.0000 | Freq: Four times a day (QID) | NASAL | Status: DC | PRN

## 2016-05-12 MED ORDER — MEPERIDINE HCL 100 MG/ML IJ SOLN
INTRAMUSCULAR | Status: AC
  Filled 2016-05-12: qty 2

## 2016-05-12 MED ORDER — MIDAZOLAM HCL 5 MG/ML IJ SOLN
INTRAMUSCULAR | Status: AC
  Filled 2016-05-12: qty 2

## 2016-05-12 MED ORDER — LIDOCAINE HCL 2 % EX GEL: 1.0000 "application " | Freq: Once | CUTANEOUS | Status: DC

## 2016-05-12 MED ORDER — MIDAZOLAM HCL 5 MG/ML IJ SOLN: 1.0000 mg | Freq: Once | INTRAMUSCULAR | Status: DC

## 2016-05-12 MED ORDER — MEPERIDINE HCL 25 MG/ML IJ SOLN
INTRAMUSCULAR | Status: DC | PRN
  Administered 2016-05-12 (×2): 25 mg via INTRAVENOUS

## 2016-05-12 MED ORDER — SODIUM CHLORIDE 0.9 % IV SOLN
INTRAVENOUS | Status: DC
  Administered 2016-05-12: 08:00:00 via INTRAVENOUS

## 2016-05-12 MED ORDER — PHENYLEPHRINE HCL 0.25 % NA SOLN
NASAL | Status: DC | PRN
  Administered 2016-05-12: 2 via NASAL

## 2016-05-12 NOTE — Progress Notes (Signed)
Video bronchoscopy performed Intervention bronchial washings Pt tolerated well  Madylin Fairbank David RRT  

## 2016-05-12 NOTE — Discharge Instructions (Signed)
Flexible Bronchoscopy, Care After These instructions give you information on caring for yourself after your procedure. Your doctor may also give you more specific instructions. Call your doctor if you have any problems or questions after your procedure. HOME CARE  Do not eat or drink anything for 2 hours after your procedure. If you try to eat or drink before the medicine wears off, food or drink could go into your lungs. You could also burn yourself.  After 2 hours have passed and when you can cough and gag normally, you may eat soft food and drink liquids slowly.  The day after the test, you may eat your normal diet.  You may do your normal activities.  Keep all doctor visits. GET HELP RIGHT AWAY IF:  You get more and more short of breath.  You get light-headed.  You feel like you are going to pass out (faint).  You have chest pain.  You have new problems that worry you.  You cough up more than a little blood.  You cough up more blood than before. MAKE SURE YOU:  Understand these instructions.  Will watch your condition.  Will get help right away if you are not doing well or get worse.   This information is not intended to replace advice given to you by your health care provider. Make sure you discuss any questions you have with your health care provider.   Document Released: 05/16/2009 Document Revised: 07/24/2013 Document Reviewed: 03/23/2013 Elsevier Interactive Patient Education 2016 [employer]  Do not eat or drink until after 1030 today 05/12/16

## 2016-05-12 NOTE — H&P (Signed)
Patient ID: [name], female   DOB: 29-Dec-1931,  .   MRN: [id number]    History of Present Illness 68 yobf minimal smoking quit around 1970  with seen for pulmonary consult during hospitalization May 2017 with a pneumococcal pneumonia with associated bacteremia and  R effusion / acute hypoxic respiratory failure. She has stage II chronic kidney disease status post renal transplant, diabetes, chronic diastolic congestive heart failure and atrial fib with RVR, on Eliquis.   History of Present Illness  02/12/2016 Pneumonia/Pleural Effusion Follow Up: Pt. Presents  for follow up for pleural effusion and pneumonia diagnosed  12/18/2015. Patient was admitted May 2017 for pneumococcal pneumonia with associated bacteremia and acute hypoxic respiratory failure. Hospitalization was compensated by atrial filb with RVR and NSTEMI demand ischemia. She was seen for pulmonary consult during this hospitalization. She was found to have a large right exudative pleural effusion-felt to be parapneumonic. She underwent thoracentesis times 2 with 1460 cc and then 800cc . Cytology neg for malignant cells.  Initial CT chest on May 23 showed a large right pleural effusion with right lower lobe collapse Repeat CT chest on May 31 showed complete atelectasis of the right lower lobe with significant atelectasis of the right middle lobe. The proximal right middle lobe bronchus appears occluded. Small to moderate right pleural effusion is decreased from previous CT scan. She was treated with antibiotics and diuretics were adjusted. . 2 D echo showed w/ EF 55%, mod LA dilation, mod TV regurg, PAP 44.  She was recently hospitalized July 4th for atrial fib with RVR. She did not want cardioversion. She has been compliant with her Eliquis. CXR today indicates continued atelectasis or infiltrate. Pt. States she continues to have shortness of  breath with activity.She is using her oxygen at 2L [place] 24/7. She is interested in weaning  off the oxygen, but I told her that since she continues to have dyspnea, we need to continue oxygen therapy until she is better.She states she continues to cough up green secretions with some blood. She states she has been coughing up blood since before her first hospital admission. She denies fever, chest pain, or orthopnea, leg or calf pain or tenderness. rec augmentin x 7 more days    03/12/2016  f/u ov/[name] re: s/p pneumocal pna/ sepsis/ R>>L effusions     Chief Complaint  Patient presents with  . Follow-up    CXR today. Pt states she has right lateral rib pain when moving. Pt denies pain on left lateral rib side. Pt c/o fatigue. Pt denies cough, f/c/s and CP/tightness.   Overall improved with breathing and no cough or pleuritic cp. She has a new R flank discomfort that is exquistely positional, better on tylenol, "feels like she pullned a muscle with acute onset 3 d prior to OV  And no assoc n or v or anorexia. Sob or fever or rash or change with breath or cough. rec Please remember to go to the lab   department downstairs for your tests - we will call you with the results when they are available. Stay on 02  But reduce to 1lpm    04/26/2016  f/u ov/[name] re:      Chief Complaint  Patient presents with  . Follow-up    CXR done today. Pt states cough has been worse with large amounts of thick, blood streaked sputum for the past wk.   sob x 50 ft  Sleeps on 2 pillows and no worse than usual  Cough worse x one month  Congested sometimes green esp in am  rec  Augmentin 875 mg take one pill twice daily  X 10 days -  You will need need to stop the eliquis x 3 days before the thoracentesis done 04/29/16 x 900 cc transudate with good reexpansion of RLL on f/u cxr     05/06/2016  f/u ov/[name] re:  R pleural effusion      Chief Complaint  Patient presents with  . Follow-up    Breathing improved after thoracentesis 04/30/16, and slowly worsening over the past 2 days. She feels  very tired today.   never had orthopnea, either before or after tap. Green mucus resolved but now sltly bloody am mucus on eliquis    No obvious day to day or daytime variability or assoc excess/ purulent sputum or mucus plugs or  chest tightness, subjective wheeze or overt sinus or hb symptoms. No unusual exp hx or h/o childhood pna/ asthma or knowledge of premature birth.  Sleeping ok without nocturnal  or early am exacerbation  of respiratory  c/o's or need for noct saba. Also denies any obvious fluctuation of symptoms with weather or environmental changes or other aggravating or alleviating factors except as outlined above   Current Medications, Allergies, Complete Past Medical History, Past Surgical History, Family History, and Social History were reviewed in [employer] record.  ROS  The following are not active complaints unless bolded sore throat, dysphagia, dental problems, itching, sneezing,  nasal congestion or excess/ purulent secretions, ear ache,   fever, chills, sweats, unintended wt loss, classically pleuritic or exertional cp,  orthopnea pnd or leg swelling improved if keeps legs elevated , presyncope, palpitations, abdominal pain, anorexia, nausea, vomiting, diarrhea  or change in bowel or bladder habits, change in stools or urine, dysuria,hematuria,  rash, arthralgias, visual complaints, headache, numbness, weakness or ataxia or problems with walking or coordination,  change in mood/affect or memory.          Significant events/studies:  12/19/2015>>2 D echo showed w/ EF 55%, mod LA dilation, mod TV regurg, PAP 44.  12/23/15>> CT scan on 5/23 showed showed a large right pleural effusion with right lower lobe collapse and patchy airspace disease of the left lower lobe, finding suspicion for aspiration pneumonia, endobronchial lesion causing collapse of the right lower lobe is also in the differential diagnosis    12/24/15>> R Thorocentesis>> 1640 cc  clear yellow, most likely exudative by LDH. ( 10 days IV Rocephin) 12/28/15>> Reaccumulation>> R parapneumonic effusion and had another 888mL's of pleural fluid removed( ABX resumed) 12/31/15>> Repeat CT scan of the chest without contrast on 5/31 showed no convincing right lower lobe mass, small to moderate right pleural effusion. Pulmonary recommended aggressive diuresis to help manage her pleural effusion Patient d/c  another 5 days of Augmentin. 01/16/16>>R Thorocentesis>> 1 Liter + amber yellow pleural fluid Wbc 230 (down from prev) with 95% L/ transudative chem - 04/26/2016 reaccumulation despite nl BNP > rec repeat Tap dry and T surgery eval - 04/30/2016  R Tcentesis 900 cc >>  Transudate, neg cytology/ progressive decrease in wbc count > cxr clear/  repeat cxr 05/06/2016  Recurrent mod/large effusion             Objective:   Physical Exam    W/c bound elderly bf = stated age in appearance     05/06/2016       151   03/12/16 154 lb 3.2 oz (69.9 kg)  03/01/16 156 lb (70.8 kg)  02/12/16 156 lb (70.8 kg)    Vital signs reviewed - 02 sats 97% on RA   General- No distress,  A&Ox3, frail elderly female in a wheel chair. ENT: No sinus tenderness, TM clear, pale nasal mucosa, no oral exudate,no post nasal drip, no LAN Cardiac: S1, S2, regular rate and rhythm, no murmur Chest: No wheeze/ rales/ diminished  Base on R   with dullnes ; no accessory muscle use, no nasal flaring, no sternal retractions Abd.: Soft Non-tender Ext: No clubbing cyanosis, - trace bilateral sym pitting ankle edema  Neuro:  normal strength Skin: No rashes, warm and dry Psych: normal mood and behavior        CXR PA and Lateral:   05/06/2016 :    I personally reviewed images and agree with radiology impression as follows:     Increased RIGHT pleural effusion and basilar atelectasis since previous exam.  Enlargement of cardiac silhouette with aortic atherosclerosis.  Underlying COPD changes.        Assessment:              Patient Instructions by Joy CowdenMichael B Taelor Waymire, MD at 05/06/2016 11:15 AM   Author: Nyoka CowdenMichael B Pavneet Markwood, MD Author Type: Physician Filed: 05/06/2016 11:16 AM  Note Status: Addendum Cosign: Cosign Not Required Encounter Date: 05/06/2016  Editor: Joy CowdenMichael B Isair Inabinet, MD (Physician)  Prior Versions: 1. Joy CowdenMichael B Anvi Mangal, MD (Physician) at 05/06/2016 11:14 AM - Addendum   2. Joy CowdenMichael B Tramane Gorum, MD (Physician) at 05/06/2016 11:13 AM - Addendum   3. Joy CowdenMichael B Will Heinkel, MD (Physician) at 05/06/2016 11:05 AM - Signed    Please remember to go to the  x-ray department downstairs for your tests - we will call you with the results when they are available.  Least invasive procedure is to adjust your diuretics to pull all the fluid out (add aldactone) and I will let Dr Ivor MessierMattlingly know why this is needed   [facility] at 715 am Wednesday 05/12/16  with nothing to eat or drink after midnight Tuesday .for Bronchoscopy and no eliquis after Monday morning's dose           05/12/2016 Day of FOB:   No change in hx  Or exam.  Continues with min streaky hemoptysis even off eliquis since 05/10/16 and agrees to proceed.    Sandrea HughsMichael Sharif Rendell, MD Pulmonary and Critical Care Medicine [facility] Cell [phone] After 5:30 PM or weekends, call [phone]

## 2016-05-12 NOTE — Op Note (Signed)
Bronchoscopy Procedure Note  Date of Operation: 05/12/2016   Pre-op Diagnosis: hemoptysis/ R Pleural effusion  Post-op Diagnosis: same/ no source of hemoptysis identified  Surgeon: Sandrea HughsMichael Wert  Anesthesia: Monitored Local Anesthesia with Sedation Time Started: 8:23  Versed 2.5 mg/ Demerol total 50 mg IV  Time Stopped:  8:38  Operation: Video Flexible fiberoptic bronchoscopy, diagnostic   Findings: extrinsic collapse of basal segments of RLL opened widely with BAL   Specimen: BAL  Estimated Blood Loss:  none  Complications: none  Indications and History: See updated H and P same date. The risks, benefits, complications, treatment options and expected outcomes were discussed with the patient.  The possibilities of reaction to medication, pulmonary aspiration, perforation of a viscus, bleeding, failure to diagnose a condition and creating a complication requiring transfusion or operation were discussed with the patient who freely signed the consent.    Description of Procedure: The patient was re-examined in the bronchoscopy suite and the site of surgery properly noted/marked.  The patient was identified  and the procedure verified as Flexible Fiberoptic Bronchoscopy.  A Time Out was held and the above information confirmed.   After the induction of topical nasopharyngeal anesthesia, the patient was positioned  and the bronchoscope was passed through the R naris. The vocal cords were visualized and  1% buffered lidocaine 5 ml was topically placed onto the cords. The cords were nl. The scope was then passed into the trachea.  1% buffered lidocaine given topically. Airways inspected bilaterally to the subsegmental level with the following findings:   Nl airways except for some extrinsic collapse RLL basil segments    Interventions:   BAL RLL for cytology      The Patient was taken to the Endoscopy Recovery area in satisfactory condition.  Attestation: I performed the  procedure.  Sandrea HughsMichael Wert, MD Pulmonary and Critical Care Medicine [facility] Cell [phone] After 5:30 PM or weekends, call [phone]

## 2016-05-13 ENCOUNTER — Telehealth: Payer: Self-pay | Admitting: Pharmacist

## 2016-05-14 ENCOUNTER — Encounter (HOSPITAL_COMMUNITY): Payer: Self-pay | Admitting: Internal Medicine

## 2016-05-14 NOTE — Telephone Encounter (Signed)
Patient requesting apixaban samples

## 2016-05-17 ENCOUNTER — Other Ambulatory Visit: Payer: Self-pay | Admitting: Internal Medicine

## 2016-05-17 ENCOUNTER — Telehealth: Payer: Self-pay

## 2016-05-17 DIAGNOSIS — J90 Pleural effusion, not elsewhere classified: Secondary | ICD-10-CM

## 2016-05-17 NOTE — Telephone Encounter (Signed)
Medication Samples have been provided to the patient.  Drug: Apixaban Strength: 5mg   Qty: 56 LOT: [id number] Exp.Date: 04/2018 Dosing instructions: Take 2 tablets PO BID   The patient has been instructed regarding the correct time, dose, and frequency of taking this medication, including desired effects and most common side effects.   Samples signed for by Dr. Marzetta BoardJennifer Kim, Pharm.D.  Joy CourierFrank  [name] 10:31 AM 05/17/2016

## 2016-05-20 ENCOUNTER — Ambulatory Visit (HOSPITAL_COMMUNITY)
Admission: RE | Admit: 2016-05-20 | Discharge: 2016-05-20 | Disposition: A | Discharge status: Home / Self Care | Payer: Medicare Other | Source: Ambulatory Visit | Attending: Internal Medicine | Admitting: Internal Medicine

## 2016-05-20 ENCOUNTER — Ambulatory Visit (HOSPITAL_COMMUNITY)
Admission: RE | Admit: 2016-05-20 | Discharge: 2016-05-20 | Disposition: A | Discharge status: Home / Self Care | Payer: Medicare Other | Source: Ambulatory Visit | Attending: Radiology | Admitting: Radiology

## 2016-05-20 DIAGNOSIS — J90 Pleural effusion, not elsewhere classified: Secondary | ICD-10-CM | POA: Diagnosis not present

## 2016-05-20 DIAGNOSIS — Z9889 Other specified postprocedural states: Secondary | ICD-10-CM | POA: Diagnosis not present

## 2016-05-20 NOTE — Procedures (Signed)
Ultrasound-guided  therapeutic right thoracentesis performed yielding 950 cc of yellow fluid. No immediate complications. Follow-up chest x-ray pending.

## 2016-05-21 ENCOUNTER — Ambulatory Visit (INDEPENDENT_AMBULATORY_CARE_PROVIDER_SITE_OTHER)
Admission: RE | Admit: 2016-05-21 | Discharge: 2016-05-21 | Disposition: A | Discharge status: Home / Self Care | Payer: Medicare Other | Source: Ambulatory Visit | Attending: Internal Medicine | Admitting: Internal Medicine

## 2016-05-21 ENCOUNTER — Other Ambulatory Visit: Payer: Self-pay | Admitting: Pharmacist

## 2016-05-21 DIAGNOSIS — J90 Pleural effusion, not elsewhere classified: Secondary | ICD-10-CM

## 2016-05-21 DIAGNOSIS — I509 Heart failure, unspecified: Secondary | ICD-10-CM | POA: Diagnosis not present

## 2016-05-21 DIAGNOSIS — I480 Paroxysmal atrial fibrillation: Secondary | ICD-10-CM

## 2016-05-21 MED ORDER — APIXABAN 2.5 MG PO TABS: 2.5000 mg | ORAL_TABLET | Freq: Two times a day (BID) | ORAL | 3 refills | Status: DC

## 2016-05-21 NOTE — Progress Notes (Signed)
[name] is a 82 y.o. female who was reviewed by clinical pharmacist for anticoagulation management   ASSESSMENT Indication(s): atrial fibrillation Duration: indefinite  Labs:    Component Value Date/Time   AST 19 01/01/2016 0345   ALT 12 (L) 01/01/2016 0345   NA 136 02/05/2016 0400   NA 138 01/19/2016 1155   K 3.5 02/05/2016 0400   CL 99 (L) 02/05/2016 0400   CO2 26 02/05/2016 0400   GLUCOSE 100 (H) 02/05/2016 0400   HGBA1C 6.9 (H) 12/18/2015 1522   BUN 24 (H) 02/05/2016 0400   BUN 22 01/19/2016 1155   CREATININE 1.45 (H) 02/05/2016 0400   CALCIUM 9.6 02/05/2016 0400   GFRAA 38 (L) 02/05/2016 0400   WBC 7.9 03/12/2016 1619   HGB 11.8 (L) 03/12/2016 1619   HCT 35.9 (L) 03/12/2016 1619   PLT 241.0 03/12/2016 1619    apixaban (Eliquis) Dose: 5 mg daily  Renal dose adjustment to 2.5 mg approved by Dr. Danella Pentonruong  Patient Instructions: Patient advised to contact clinic or seek medical attention if signs/symptoms of bleeding or thromboembolism occur. Patient verbalized understanding by repeating back information.  Follow-up Recommended labs to consider: CBC, CMP, LFT. Next appointment due around March 2018  [name],[name] Clinical Pharmacist  05/21/2016, 2:15 PM

## 2016-05-21 NOTE — Progress Notes (Signed)
LMTCB

## 2016-05-24 ENCOUNTER — Ambulatory Visit (HOSPITAL_COMMUNITY): Payer: Medicare Other

## 2016-05-25 ENCOUNTER — Other Ambulatory Visit: Payer: Self-pay | Admitting: Internal Medicine

## 2016-05-25 MED ORDER — AMOXICILLIN 500 MG PO CAPS: 500.0000 mg | ORAL_CAPSULE | Freq: Three times a day (TID) | ORAL | 0 refills | Status: DC

## 2016-06-01 ENCOUNTER — Observation Stay (HOSPITAL_COMMUNITY): Payer: Medicare Other

## 2016-06-01 ENCOUNTER — Inpatient Hospital Stay (HOSPITAL_COMMUNITY)
Admission: EM | Admit: 2016-06-01 | Discharge: 2016-06-04 | DRG: 392 | Disposition: A | Discharge status: Home / Self Care | Payer: Medicare Other | Attending: Internal Medicine | Admitting: Internal Medicine

## 2016-06-01 ENCOUNTER — Emergency Department (HOSPITAL_COMMUNITY): Payer: Medicare Other

## 2016-06-01 DIAGNOSIS — I5032 Chronic diastolic (congestive) heart failure: Secondary | ICD-10-CM | POA: Diagnosis present

## 2016-06-01 DIAGNOSIS — Z9981 Dependence on supplemental oxygen: Secondary | ICD-10-CM

## 2016-06-01 DIAGNOSIS — Z7901 Long term (current) use of anticoagulants: Secondary | ICD-10-CM

## 2016-06-01 DIAGNOSIS — J90 Pleural effusion, not elsewhere classified: Secondary | ICD-10-CM | POA: Diagnosis present

## 2016-06-01 DIAGNOSIS — Z882 Allergy status to sulfonamides status: Secondary | ICD-10-CM

## 2016-06-01 DIAGNOSIS — K8010 Calculus of gallbladder with chronic cholecystitis without obstruction: Secondary | ICD-10-CM | POA: Diagnosis present

## 2016-06-01 DIAGNOSIS — K2970 Gastritis, unspecified, without bleeding: Principal | ICD-10-CM | POA: Diagnosis present

## 2016-06-01 DIAGNOSIS — N183 Chronic kidney disease, stage 3 unspecified: Secondary | ICD-10-CM | POA: Diagnosis present

## 2016-06-01 DIAGNOSIS — J9611 Chronic respiratory failure with hypoxia: Secondary | ICD-10-CM | POA: Diagnosis present

## 2016-06-01 DIAGNOSIS — I252 Old myocardial infarction: Secondary | ICD-10-CM

## 2016-06-01 DIAGNOSIS — Z87891 Personal history of nicotine dependence: Secondary | ICD-10-CM

## 2016-06-01 DIAGNOSIS — Z79899 Other long term (current) drug therapy: Secondary | ICD-10-CM

## 2016-06-01 DIAGNOSIS — K8020 Calculus of gallbladder without cholecystitis without obstruction: Secondary | ICD-10-CM | POA: Diagnosis not present

## 2016-06-01 DIAGNOSIS — Z7982 Long term (current) use of aspirin: Secondary | ICD-10-CM

## 2016-06-01 DIAGNOSIS — K219 Gastro-esophageal reflux disease without esophagitis: Secondary | ICD-10-CM | POA: Diagnosis present

## 2016-06-01 DIAGNOSIS — E1122 Type 2 diabetes mellitus with diabetic chronic kidney disease: Secondary | ICD-10-CM | POA: Diagnosis not present

## 2016-06-01 DIAGNOSIS — R112 Nausea with vomiting, unspecified: Secondary | ICD-10-CM

## 2016-06-01 DIAGNOSIS — Z66 Do not resuscitate: Secondary | ICD-10-CM | POA: Diagnosis present

## 2016-06-01 DIAGNOSIS — K529 Noninfective gastroenteritis and colitis, unspecified: Secondary | ICD-10-CM | POA: Diagnosis present

## 2016-06-01 DIAGNOSIS — I2510 Atherosclerotic heart disease of native coronary artery without angina pectoris: Secondary | ICD-10-CM

## 2016-06-01 DIAGNOSIS — I480 Paroxysmal atrial fibrillation: Secondary | ICD-10-CM | POA: Diagnosis present

## 2016-06-01 DIAGNOSIS — Z955 Presence of coronary angioplasty implant and graft: Secondary | ICD-10-CM

## 2016-06-01 DIAGNOSIS — A420 Pulmonary actinomycosis: Secondary | ICD-10-CM | POA: Diagnosis present

## 2016-06-01 DIAGNOSIS — M109 Gout, unspecified: Secondary | ICD-10-CM | POA: Diagnosis present

## 2016-06-01 DIAGNOSIS — Z940 Kidney transplant status: Secondary | ICD-10-CM

## 2016-06-01 DIAGNOSIS — R109 Unspecified abdominal pain: Secondary | ICD-10-CM | POA: Diagnosis not present

## 2016-06-01 DIAGNOSIS — R509 Fever, unspecified: Secondary | ICD-10-CM | POA: Diagnosis not present

## 2016-06-01 DIAGNOSIS — I130 Hypertensive heart and chronic kidney disease with heart failure and stage 1 through stage 4 chronic kidney disease, or unspecified chronic kidney disease: Secondary | ICD-10-CM | POA: Diagnosis not present

## 2016-06-01 DIAGNOSIS — Z9861 Coronary angioplasty status: Secondary | ICD-10-CM

## 2016-06-01 LAB — CBC WITH DIFFERENTIAL/PLATELET
BASOS PCT: 0 %
Basophils Absolute: 0 10*3/uL (ref 0.0–0.1)
EOS ABS: 0 10*3/uL (ref 0.0–0.7)
EOS PCT: 0 %
HCT: 39.7 % (ref 36.0–46.0)
HEMOGLOBIN: 12.7 g/dL (ref 12.0–15.0)
LYMPHS ABS: 0.8 10*3/uL (ref 0.7–4.0)
Lymphocytes Relative: 9 %
MCH: 29.7 pg (ref 26.0–34.0)
MCHC: 32 g/dL (ref 30.0–36.0)
MCV: 92.8 fL (ref 78.0–100.0)
MONO ABS: 0.5 10*3/uL (ref 0.1–1.0)
MONOS PCT: 6 %
Neutro Abs: 7 10*3/uL (ref 1.7–7.7)
Neutrophils Relative %: 85 %
Platelets: 196 10*3/uL (ref 150–400)
RBC: 4.28 MIL/uL (ref 3.87–5.11)
RDW: 15.3 % (ref 11.5–15.5)
WBC: 8.2 10*3/uL (ref 4.0–10.5)

## 2016-06-01 LAB — COMPREHENSIVE METABOLIC PANEL
ALBUMIN: 3.7 g/dL (ref 3.5–5.0)
ALK PHOS: 101 U/L (ref 38–126)
ALT: 10 U/L — ABNORMAL LOW (ref 14–54)
ANION GAP: 10 (ref 5–15)
AST: 23 U/L (ref 15–41)
BUN: 20 mg/dL (ref 6–20)
CALCIUM: 10.6 mg/dL — AB (ref 8.9–10.3)
CO2: 24 mmol/L (ref 22–32)
Chloride: 104 mmol/L (ref 101–111)
Creatinine, Ser: 1.24 mg/dL — ABNORMAL HIGH (ref 0.44–1.00)
GFR calc non Af Amer: 39 mL/min — ABNORMAL LOW (ref >60)
GFR, EST AFRICAN AMERICAN: 46 mL/min — AB (ref >60)
GLUCOSE: 167 mg/dL — AB (ref 65–99)
POTASSIUM: 4.1 mmol/L (ref 3.5–5.1)
SODIUM: 138 mmol/L (ref 135–145)
TOTAL PROTEIN: 6.8 g/dL (ref 6.5–8.1)
Total Bilirubin: 0.8 mg/dL (ref 0.3–1.2)

## 2016-06-01 LAB — LIPASE, BLOOD: Lipase: 22 U/L (ref 11–51)

## 2016-06-01 LAB — I-STAT TROPONIN, ED: TROPONIN I, POC: 0.06 ng/mL (ref 0.00–0.08)

## 2016-06-01 MED ORDER — AMOXICILLIN 500 MG PO CAPS
500.0000 mg | ORAL_CAPSULE | Freq: Three times a day (TID) | ORAL | Status: DC
  Administered 2016-06-02 – 2016-06-04 (×7): 500 mg via ORAL
  Filled 2016-06-01 (×9): qty 1

## 2016-06-01 MED ORDER — AMIODARONE HCL 200 MG PO TABS
200.0000 mg | ORAL_TABLET | Freq: Two times a day (BID) | ORAL | Status: DC
  Administered 2016-06-02 – 2016-06-04 (×5): 200 mg via ORAL
  Filled 2016-06-01 (×5): qty 1

## 2016-06-01 MED ORDER — CINACALCET HCL 30 MG PO TABS: 30.0000 mg | ORAL_TABLET | ORAL | Status: DC

## 2016-06-01 MED ORDER — TACROLIMUS 1 MG PO CAPS
1.5000 mg | ORAL_CAPSULE | Freq: Two times a day (BID) | ORAL | Status: DC
  Administered 2016-06-02 – 2016-06-04 (×5): 1.5 mg via ORAL
  Filled 2016-06-01 (×7): qty 1

## 2016-06-01 MED ORDER — ASPIRIN EC 81 MG PO TBEC
81.0000 mg | DELAYED_RELEASE_TABLET | Freq: Every day | ORAL | Status: DC
  Administered 2016-06-02 – 2016-06-04 (×3): 81 mg via ORAL
  Filled 2016-06-01 (×3): qty 1

## 2016-06-01 MED ORDER — PROMETHAZINE HCL 25 MG/ML IJ SOLN
12.5000 mg | INTRAMUSCULAR | Status: DC | PRN
  Administered 2016-06-01 – 2016-06-03 (×3): 12.5 mg via INTRAVENOUS
  Filled 2016-06-01 (×3): qty 1

## 2016-06-01 MED ORDER — MYCOPHENOLATE MOFETIL 250 MG PO CAPS
500.0000 mg | ORAL_CAPSULE | Freq: Two times a day (BID) | ORAL | Status: DC
  Administered 2016-06-02 – 2016-06-04 (×5): 500 mg via ORAL
  Filled 2016-06-01 (×5): qty 2

## 2016-06-01 MED ORDER — SODIUM CHLORIDE 0.9 % IV SOLN
INTRAVENOUS | Status: AC
  Administered 2016-06-02: via INTRAVENOUS

## 2016-06-01 MED ORDER — ONDANSETRON HCL 4 MG/2ML IJ SOLN
4.0000 mg | Freq: Once | INTRAMUSCULAR | Status: AC
  Administered 2016-06-01: 4 mg via INTRAVENOUS
  Filled 2016-06-01: qty 2

## 2016-06-01 MED ORDER — METOPROLOL TARTRATE 12.5 MG HALF TABLET
12.5000 mg | ORAL_TABLET | Freq: Two times a day (BID) | ORAL | Status: DC
  Administered 2016-06-02 – 2016-06-04 (×5): 12.5 mg via ORAL
  Filled 2016-06-01 (×5): qty 1

## 2016-06-01 MED ORDER — FENTANYL CITRATE (PF) 100 MCG/2ML IJ SOLN
12.5000 ug | Freq: Once | INTRAMUSCULAR | Status: AC
  Administered 2016-06-01: 12.5 ug via INTRAVENOUS
  Filled 2016-06-01: qty 2

## 2016-06-01 MED ORDER — ALLOPURINOL 100 MG PO TABS
100.0000 mg | ORAL_TABLET | Freq: Every day | ORAL | Status: DC
  Administered 2016-06-03: 100 mg via ORAL
  Filled 2016-06-01 (×2): qty 1

## 2016-06-01 MED ORDER — FAMOTIDINE 20 MG PO TABS
20.0000 mg | ORAL_TABLET | Freq: Two times a day (BID) | ORAL | Status: DC
  Administered 2016-06-02: 20 mg via ORAL
  Filled 2016-06-01: qty 1

## 2016-06-01 MED ORDER — APIXABAN 2.5 MG PO TABS
2.5000 mg | ORAL_TABLET | Freq: Two times a day (BID) | ORAL | Status: DC
  Administered 2016-06-02 – 2016-06-04 (×5): 2.5 mg via ORAL
  Filled 2016-06-01 (×5): qty 1

## 2016-06-01 NOTE — ED Notes (Signed)
Admitting MD at bedside.

## 2016-06-01 NOTE — Progress Notes (Signed)
New Admission Note: pt admitted to the 6E28 from [facility]  Arrival Method: via stretcher Mental Orientation: alert and oriented x 4 Telemetry: No telemetry orders Assessment: Completed Skin: Intact IV: R fa SL Pain: denies Tubes: None Safety Measures: Safety Fall Prevention Plan has been discussed  Admission: To be completed [place] Orientation: Patient has been orientated to the room, unit and staff.  Family: None at bedside at this time  Orders to be reviewed and implemented. Will continue to monitor the patient. Call light has been placed within reach and bed alarm has been activated.   Burley SaverKami Zachory Mangual, BSN, RN-BC Phone: [phone]

## 2016-06-01 NOTE — ED Triage Notes (Addendum)
Patient came in per [facility] with c/o nausea and vomiting. Hx of MI, CHF, COPD, afib. Patient had chest pain last night and abdominal pain started today. Patient was recently admitted for pneumonia. EMS v/s 162/76, 70 HR, 16 RR, 96% on 2L chronically. EMS fsbs 181. EMS EKG NS with LBBB. Given 325 mg aspirin. Takes eliqus. Took laxative this AM thinking she may be constipated.

## 2016-06-01 NOTE — ED Provider Notes (Signed)
[facility]-EMERGENCY DEPT Provider Note   CSN: [id number] Arrival date & time: 06/01/16  1559     History   Chief Complaint Chief Complaint  Patient presents with  . Nausea  . Emesis  . Abdominal Pain    HPI [name] is an 82 year old woman with history of cholelithiasis, chronic diastolic CHF, CKD3 with previous cadaveric renal transplant in 2003, CAD s/p PCI in 2002, GERD, HTN, T2DM, pAF on Eliquis.  HPI She presents with nausea/vomiting. She reports she has been sick since February or March. She felt "pretty good" yesterday. She ate some candied yams. She thought she had gas. She took some medication for gas. She starting having nausea and vomiting this morning. She has thrown up at least 3 times today. No hematemesis. She tried to eat some soup which made her vomit some more. She tried to take some milk of magnesia (prior to her emesis). She has had multiple bowel movements since then. Her bowel movements have been watery. Does not know if there is any blood in her stool. She has periumbilical pain. She is unable to describe the nature of the pain. It is constant. No relieving or exacerbating factors. She took some antibiotic today - does not remember what antibiotic it was. Last had antibiotic course in August per patient (per EMR review, had Augmentin course end of September). No sick contacts. It has been a long time since she has had symptoms like this.    Past Medical History:  Diagnosis Date  . Candidal esophagitis (HCC)    a. 03/2014.  Marland Kitchen Cholelithiasis   . Chronic diastolic CHF (congestive heart failure) (HCC)    a. 06/2011 Echo: EF 60-65%, no rwma, Gr1 DD, mild TR. // b. Echo 12/19/15: Severe LVH, EF 55-60%, normal wall motion, mild MR, moderate LAE, moderate TR, PASP 44 mmHg  . CKD (chronic kidney disease), stage III    a. in setting of prior ESRD and cadaveric tx in 2003.  Marland Kitchen Coronary artery disease    a. 01/2001 Cath/PCI: LAD 50p, 81m, D1 50-60, RCA 64m (3.0x13 BX Velocity  BMS).// b. NSTEMI 5/17 (demand ischemia) in setting of AF with RVR, pneumonia, CKD, a/c HF >> Myoview 12/26/15: prob inf infarct, no ischemia, EF 52%, Low Risk >> med Rx  . Gastritis    a. 03/2014  . Gout    unconfirmed by joint aspiration  . History of bacteremia    a. 08/2004 Group A Strep bacteremia.  . Hypertensive heart disease   . S/P kidney transplant    a. due to FSGN, follows with Dr. Vivia Birmingham in 2003. Was on HD prior to that.  . Type II diabetes mellitus [facility])     Patient Active Problem List   Diagnosis Date Noted  . Hemoptysis 05/12/2016  . Other fatigue 03/31/2016  . Chronic respiratory failure with hypoxia (HCC) 03/13/2016  . Atrial fibrillation with RVR (HCC) 02/03/2016  . Atrial fibrillation with rapid ventricular response (HCC)   . Chronic diastolic (congestive) heart failure   . Tachyarrhythmia   . PAF (paroxysmal atrial fibrillation) (HCC) 12/24/2015  . CKD (chronic kidney disease) stage 3, GFR 30-59 ml/min   . Bacteremia   . Elevated troponin 08/11/2014  . Candidal esophagitis (HCC) 03/20/2014  . Gastritis determined by endoscopy 03/20/2014  . Acute respiratory failure with hypoxia (HCC) 07/08/2013  . Dyspnea 07/07/2013  . Pleural effusion, bilateral 07/07/2013  . Gout 05/03/2012  . Preventative health care 05/03/2012  . CAD S/P remote RCA PCI 04/19/2012  .  Hypertension 04/19/2012  . H/O secondary hyperparathyroidism 04/19/2012  . History of cholelithiasis 04/19/2012  . S/P kidney transplant     Past Surgical History:  Procedure Laterality Date  . ABDOMINAL HYSTERECTOMY    . ESOPHAGOGASTRODUODENOSCOPY N/A 03/20/2014   Procedure: ESOPHAGOGASTRODUODENOSCOPY (EGD);  Surgeon: Theda Belfast, MD;  Location: [facility] ENDOSCOPY;  Service: Endoscopy;  Laterality: N/A;  . KIDNEY TRANSPLANT  2003  . VIDEO BRONCHOSCOPY Bilateral 05/12/2016   Procedure: VIDEO BRONCHOSCOPY WITHOUT FLUORO;  Surgeon: Nyoka Cowden, MD;  Location: [facility] ENDOSCOPY;  Service:  Cardiopulmonary;  Laterality: Bilateral;    Home Medications    Prior to Admission medications   Medication Sig Start Date End Date Taking? Authorizing Provider  allopurinol (ZYLOPRIM) 100 MG tablet Take 100 mg by mouth at bedtime.    Historical Provider, MD  amiodarone (PACERONE) 200 MG tablet Take 1 tablet (200 mg total) by mouth 2 (two) times daily. 02/06/16   Little Ishikawa, NP  amoxicillin (AMOXIL) 500 MG capsule Take 1 capsule (500 mg total) by mouth 3 (three) times daily. 05/25/16   Nyoka Cowden, MD  apixaban (ELIQUIS) 2.5 MG TABS tablet Take 1 tablet (2.5 mg total) by mouth 2 (two) times daily. Cancel previous Eliquis prescription 05/21/16   Denton Brick, MD  aspirin EC 81 MG tablet Take 81 mg by mouth daily.    Historical Provider, MD  cinacalcet (SENSIPAR) 30 MG tablet Take 1 tablet by mouth 2 (two) times a week.    Historical Provider, MD  cinacalcet (SENSIPAR) 90 MG tablet Take 30 mg by mouth daily.     Historical Provider, MD  famotidine (PEPCID) 20 MG tablet Take 20 mg by mouth 2 (two) times daily.    Historical Provider, MD  furosemide (LASIX) 80 MG tablet Take 80 mg by mouth 2 (two) times daily.    Historical Provider, MD  metoprolol tartrate (LOPRESSOR) 25 MG tablet Take 1 tablet (25 mg total) by mouth 2 (two) times daily. Patient taking differently: Take 12.5 mg by mouth 2 (two) times daily.  03/01/16   Chrystie Nose, MD  mycophenolate (CELLCEPT) 250 MG capsule Take 500 mg by mouth 2 (two) times daily.    Historical Provider, MD  tacrolimus (PROGRAF) 0.5 MG capsule 1 mg in the morning and 1 mg at bedtime Patient taking differently: Take 1.5 mg by mouth 2 (two) times daily.  01/01/16   Richarda Overlie, MD    Family History Family History  Problem Relation Age of Onset  . CAD Father   . CAD Brother   . CAD      Both parents and multiple siblings have died from coronary disease prior to age 52 and several in their 81's and 51's.    Social History Social History  Substance  Use Topics  . Smoking status: Former Smoker    Quit date: 04/19/1969  . Smokeless tobacco: Never Used  . Alcohol use No     Allergies   Sulfa drugs cross reactors   Review of Systems Review of Systems Constitutional: no fevers/chills Eyes: no vision changes Ears, nose, mouth, throat, and face: +cough productive of dark green to clear sputum Respiratory: no shortness of breath Cardiovascular: +chest pain Gastrointestinal: +nausea/vomiting, +abdominal pain, no constipation, +diarrhea Genitourinary: no dysuria, no hematuria Integument: no rash Hematologic/lymphatic: no bleeding, no edema Musculoskeletal: no arthralgias, no myalgias Neurological: no paresthesias, no weakness   Physical Exam Updated Vital Signs BP 133/76 (BP Location: Right Arm)   Pulse (!) 56  Temp 97.3 F (36.3 C) (Oral)   Resp 19   SpO2 99%   Physical Exam General Apperance: NAD Head: Normocephalic, atraumatic Eyes: PERRL, EOMI, anicteric sclera Ears: Normal external ear canal Nose: Nares normal, septum midline, mucosa normal Throat: Lips, mucosa and tongue normal  Neck: Supple, trachea midline Back: No tenderness or bony abnormality  Lungs: Clear to auscultation bilaterally. No wheezes, rhonchi or rales. Breathing comfortably Chest Wall: Nontender, no deformity Heart: Regular rate and rhythm, no murmur/rub/gallop Abdomen: Soft, nontender, nondistended, no rebound/guarding Extremities: Normal, atraumatic, warm and well perfused, no edema Pulses: 2+ throughout Skin: No rashes or lesions Neurologic: Alert and oriented x 3. CNII-XII intact. Normal strength and sensation   ED Treatments / Results  Labs (all labs ordered are listed, but only abnormal results are displayed) Labs Reviewed  COMPREHENSIVE METABOLIC PANEL - Abnormal; Notable for the following:       Result Value   Glucose, Bld 167 (*)    Creatinine, Ser 1.24 (*)    Calcium 10.6 (*)    ALT 10 (*)    GFR calc non Af Amer 39 (*)     GFR calc Af Amer 46 (*)    All other components within normal limits  CBC WITH DIFFERENTIAL/PLATELET  LIPASE, BLOOD  I-STAT TROPOININ, ED    EKG  EKG Interpretation  Date/Time:  Tuesday June 01 2016 16:33:07 EDT Ventricular Rate:  59 PR Interval:    QRS Duration: 202 QT Interval:  562 QTC Calculation: 557 R Axis:   -59 Text Interpretation:  Age not entered, assumed to be  82 years old for purpose of ECG interpretation Sinus arrhythmia Prolonged PR interval Nonspecific IVCD with LAD LVH with secondary repolarization abnormality No significant change since last tracing Confirmed by BEATON  MD, ROBERT ([id number]) on 06/01/2016 4:37:03 PM       Radiology Dg Abdomen 1 View  Result Date: 06/01/2016 CLINICAL DATA:  Nausea vomiting abdominal pain and diarrhea EXAM: ABDOMEN - 1 VIEW COMPARISON:  03/12/2014 FINDINGS: Nonspecific decreased bowel gas. Small right upper quadrant calcifications could be costochondral in etiology. Partially visualized right lung base demonstrates air bronchograms consistent with consolidation with small right-sided effusion. Small amount of left basilar effusion or thickening. Partially visualized ovoid opacity in the left groin with adjacent surgical clips. This was noted on CT from 2015. IMPRESSION: 1. Nonspecific decreased bowel gas. 2. Small right-sided pleural effusion with partially visualized consolidation at the right lung base. Small left-sided pleural effusion with basilar atelectasis. Electronically Signed   By: Jasmine PangKim  Fujinaga [name]   On: 06/01/2016 17:32    Procedures   Medications Ordered in ED Medications  promethazine (PHENERGAN) injection 12.5 mg (not administered)  fentaNYL (SUBLIMAZE) injection 12.5 mcg (not administered)  ondansetron (ZOFRAN) injection 4 mg (4 mg Intravenous Given 06/01/16 1659)    Initial Impression / Assessment and Plan / ED Course  I have reviewed the triage vital signs and the nursing notes.  Pertinent labs & imaging  results that were available during my care of the patient were reviewed by me and considered in my medical decision making (see chart for details).  Clinical Course  82 year old woman with history of cholelithiasis, chronic diastolic CHF, CKD3 with previous cadaveric renal transplant in 2003 on immunosuppresion, CAD s/p PCI in 2002, GERD, HTN, T2DM, pAF on Eliquis presenting with nausea/vomiting, abdominal pain, diarrhea. She took milk of magnesia today. EKG unchanged from previous. Troponin POC 0.06. CMP and lipase unremarkable and creatinine at baseline. CBC with no leukocytosis.  Abdominal XR with nonspecific decreased bowel gas.   Final Clinical Impressions(s) / ED Diagnoses   Final diagnoses:  Gastroenteritis  Symptoms consistent with acute gastroenteritis. She did not respond to Zofran and continues to have abdominal pain. She was given 12.5mg  of fentanyl and 12.5mg  of Phenergan. Will ask IMTS to admit for observation.  New Prescriptions New Prescriptions   No medications on file     Lora PaulaJennifer T Arlon Bleier, MD 06/01/16 [id number]    Nelva Nayobert Beaton, MD 06/30/16 [phone]

## 2016-06-01 NOTE — ED Notes (Signed)
Floor RN unable to take report at this time. Will call us back

## 2016-06-01 NOTE — ED Notes (Signed)
Patient taken to xray.

## 2016-06-01 NOTE — ED Notes (Signed)
Pt taken to US

## 2016-06-01 NOTE — H&P (Signed)
Date: 06/01/2016               Patient Name:  [name] MRN: [id number]  DOB: Apr 17, 1932 Age / Sex: 82 y.o., female   PCP: Denton Brick, MD         Medical Service: Internal Medicine Teaching Service         Attending Physician: Dr. Gust Rung, DO    First Contact: Dr. Nyra Market Pager: [phone]  Second Contact: Dr. Deneise Lever Pager: [phone]       After Hours (After 5p/  First Contact Pager: [phone]  weekends / holidays): Second Contact Pager: [phone]   Chief Complaint: Nausea and Vomiting  History of Present Illness:  [name] is an 82 year old woman with history of cholelithiasis, chronic diastolic heart failure, CKD III s/p renal transplant is 2003, CAD s/p PCI in 2002, Diabetes Mellitus type II, paroxysmal atrial fibrillation on Eliquis that presents to the ED with nausea and vomiting.  She states that she woke up this morning around 7am and took milk of magnesium because she hadn't had a bowel movement in several days and had some mild abdominal pain.  She had some loose stools after taking the MOM.  Around 10:30am she took her antibiotics and immediately started having nausea and vomiting.  She states that she is taking the antibiotic for a lung infection.  She has had to have fluid removed several times, with the last time being two weeks ago.  She states that she is supposed to take the antibiotic for 6 months.  After having up to ten vomiting episodes today she sought medical attention.  She reports eating candied yams last night but did not start feeling well until this morning. She states she has not had any vomiting events since being in the ED.  Associated symptoms include abdominal pain and weakness.  She denies dizziness, chest pain, shortness of breath or leg swelling above baseline.  Meds:  Current Meds  Medication Sig  . allopurinol (ZYLOPRIM) 100 MG tablet Take 100 mg by mouth daily.   Marland Kitchen amiodarone (PACERONE) 200 MG tablet Take 1 tablet (200  mg total) by mouth 2 (two) times daily.  Marland Kitchen amLODipine (NORVASC) 10 MG tablet Take 5 mg by mouth daily.  Marland Kitchen amoxicillin (AMOXIL) 500 MG capsule Take 1 capsule (500 mg total) by mouth 3 (three) times daily.  Marland Kitchen apixaban (ELIQUIS) 2.5 MG TABS tablet Take 1 tablet (2.5 mg total) by mouth 2 (two) times daily. Cancel previous Eliquis prescription  . aspirin EC 81 MG tablet Take 81 mg by mouth daily.  . cinacalcet (SENSIPAR) 90 MG tablet Take 22.5 mg by mouth See admin instructions. Once every one to two weeks  . famotidine (PEPCID) 20 MG tablet Take 20 mg by mouth 2 (two) times daily.  . furosemide (LASIX) 80 MG tablet Take 80 mg by mouth 2 (two) times daily.  . metoprolol (LOPRESSOR) 50 MG tablet Take 25 mg by mouth 2 (two) times daily.  . mycophenolate (CELLCEPT) 250 MG capsule Take 500 mg by mouth 2 (two) times daily.  . simvastatin (ZOCOR) 20 MG tablet Take 20 mg by mouth every evening.  . tacrolimus (PROGRAF) 0.5 MG capsule 1 mg in the morning and 1 mg at bedtime (Patient taking differently: Take 0.5 mg by mouth 2 (two) times daily. (To be taken in conjunction with one 1 mg capsule to equal a total of 1.5 milligrams/each dose))  .  tacrolimus (PROGRAF) 1 MG capsule Take 1 mg by mouth 2 (two) times daily. (To be taken in conjunction with one 0.5 mg capsule to equal a total of 1.5 milligrams/each dose)     Allergies: Allergies as of 06/01/2016 - Review Complete 06/01/2016  Allergen Reaction Noted  . Sulfa drugs cross reactors Swelling 12/03/2011   Past Medical History:  Diagnosis Date  . Candidal esophagitis (HCC)    a. 03/2014.  Marland Kitchen. Cholelithiasis   . Chronic diastolic CHF (congestive heart failure) (HCC)    a. 06/2011 Echo: EF 60-65%, no rwma, Gr1 DD, mild TR. // b. Echo 12/19/15: Severe LVH, EF 55-60%, normal wall motion, mild MR, moderate LAE, moderate TR, PASP 44 mmHg  . CKD (chronic kidney disease), stage III    a. in setting of prior ESRD and cadaveric tx in 2003.  Marland Kitchen. Coronary artery  disease    a. 01/2001 Cath/PCI: LAD 50p, 5224m, D1 50-60, RCA 5344m (3.0x13 BX Velocity BMS).// b. NSTEMI 5/17 (demand ischemia) in setting of AF with RVR, pneumonia, CKD, a/c HF >> Myoview 12/26/15: prob inf infarct, no ischemia, EF 52%, Low Risk >> med Rx  . Gastritis    a. 03/2014  . Gout    unconfirmed by joint aspiration  . History of bacteremia    a. 08/2004 Group A Strep bacteremia.  . Hypertensive heart disease   . S/P kidney transplant    a. due to FSGN, follows with Dr. Vivia BirminghamMattingly-->Transplant in 2003. Was on HD prior to that.  . Type II diabetes mellitus (HCC)     Family History:  Family History  Problem Relation Age of Onset  . CAD Father   . CAD Brother   . CAD      Both parents and multiple siblings have died from coronary disease prior to age 80 and several in their 640's and 3750's.     Social History: Tobacco use: denies Alcohol use: denies Illicit drug use: denies   Review of Systems: A complete ROS was negative except as per HPI.  Physical Exam: Blood pressure 133/60, pulse 60, temperature 97.3 F (36.3 C), temperature source Oral, resp. rate 18, SpO2 100 %. Vitals:   06/01/16 1829 06/01/16 1900 06/01/16 1930 06/01/16 2030  BP: 133/76 126/64 139/69 133/60  Pulse: (!) 56 (!) 52 (!) 51 60  Resp: 19 17 18 18   Temp:      TempSrc:      SpO2: 99% 99% 99% 100%   General: Vital signs reviewed.  Patient is hunched over in the hospital bed, she is cooperative with exam.  Head: Normocephalic and atraumatic. Eyes: EOMI, conjunctivae normal, no scleral icterus.  Neck: Supple, trachea midline, normal ROM, no JVD, masses, thyromegaly, or carotid bruit present.  Cardiovascular: RRR, S1 normal, S2 normal, no murmurs, gallops, or rubs. Pulmonary/Chest: poor inspiratory effort, decreased breath sounds on right and basilar crackles on left Abdominal: Soft, non-tender, non-distended, BS +, no masses, organomegaly, or guarding present.  Musculoskeletal: No joint deformities,  erythema, or stiffness Extremities: No lower extremity edema bilaterally Skin: Warm, dry and intact. No rashes or erythema. Psychiatric: Normal mood and affect. speech and behavior is normal. Cognition and memory are normal.   BMET    Component Value Date/Time   NA 138 06/01/2016 1655   NA 138 01/19/2016 1155   K 4.1 06/01/2016 1655   CL 104 06/01/2016 1655   CO2 24 06/01/2016 1655   GLUCOSE 167 (H) 06/01/2016 1655   BUN 20 06/01/2016 1655  BUN 22 01/19/2016 1155   CREATININE 1.24 (H) 06/01/2016 1655   CALCIUM 10.6 (H) 06/01/2016 1655   GFRNONAA 39 (L) 06/01/2016 1655   GFRAA 46 (L) 06/01/2016 1655   CBC    Component Value Date/Time   WBC 8.2 06/01/2016 1655   RBC 4.28 06/01/2016 1655   HGB 12.7 06/01/2016 1655   HCT 39.7 06/01/2016 1655   PLT 196 06/01/2016 1655   MCV 92.8 06/01/2016 1655   MCH 29.7 06/01/2016 1655   MCHC 32.0 06/01/2016 1655   RDW 15.3 06/01/2016 1655   LYMPHSABS 0.8 06/01/2016 1655   MONOABS 0.5 06/01/2016 1655   EOSABS 0.0 06/01/2016 1655   BASOSABS 0.0 06/01/2016 1655     EKG: Sinus arrythmia, prolonged PR intenterval, No ST elevation or T wave inversion  Abdominal X-ray FINDINGS: Nonspecific decreased bowel gas. Small right upper quadrant calcifications could be costochondral in etiology. Partially visualized right lung base demonstrates air bronchograms consistent with consolidation with small right-sided effusion. Small amount of left basilar effusion or thickening. Partially visualized ovoid opacity in the left groin with adjacent surgical clips. This was noted on CT from 2015.  IMPRESSION: 1. Nonspecific decreased bowel gas. 2. Small right-sided pleural effusion with partially visualized consolidation at the right lung base. Small left-sided pleural effusion with basilar atelectasis.  Assessment & Plan by Problem:  Nausea/Vomiting Patient stated she had some candied yams last night and woke up this morning with mild abdominal  pain and took milk of magnesium with bowel movement following.  She took her antibiotics and had sudden onset of nausea and vomiting.  She had no reproducible abdominal pain on exam.  Not sure the etiology of her nausea and vomiting at this point.  It could be related to the yams she ate last night.  Antibiotic use was closely administered prior to nausea and vomiting and this is a side effect of Augmentin.  However, she has been taking this medication for several days and states she has not had issues with nausea or vomiting prior to today.  This may be a viral gastroenteritis and will monitor with fluids and phenergan and assess for clinical improvement in the morning.  Patient is afebrile with no leukocytosis.  She is on chronic immunosuppressive medication for her renal transplant in 2003 and this may mask having a fever or leukocytosis.  May consider getting a CT of abdomen if patient does not improve.  Another thought is cholecystitis because of history of cholelethiasis.  However, LFTs are not elevated.  We will get a RUQ to assess for any gallbladder changes.  Lipase was within normal limits.  Not concerned for pancreatitis.   - IV NS - phenergan  - Right upper quadrant ultrasound - Pepcid    Chronic hypoxic respiratory failure Patient was admitted in May 2017 for pneumococcal pneumonia with associated bacteremia and acute hypoxic respiratory failure.  She was found to have a large right exudative pleural effusion and underwent thoracentesis.  On 10/11 she had a bronchoscopy and revealed actinomyces species. Amoxicillin was started on 10/24.  Since her discharge in May 2017 she has been on chronic home oxygen 4L. - Supplemental oxygen 4L - continue amoxicillin      CKD III s/p kidney transplant Patient's creatinine is at baseline, 1.24.  Patient takes tacrolimus and mycophenolate to prevent organ rejection. -BMET -Tacrolimus and mycophenolate  HFpEF Echo is 12/2015 showed a LV EF: 55-60% and  severe LVH.  Patient is taking metoprolol tartrate 12.5mg  BID and lasix  80mg  BID - Continue metoprolol - holding lasix and reassess in the morning if needs to be restarted  Paroxysmal atrial fibrillation on Eliquis Patient is not currently in afib.  Not complaining of heart palpitations or chest pain.  We will continue home Eliquis and amiodarone - Eliquis - amiodarone   Hypertension  Blood pressure stable at 139/69. - Continue home blood pressure medications  CAD s/p PCI Patient is not complaining of chest pain.  Troponin in the ED was 0.06 -aspirin  Type II diabetes mellitus Last A1C was 6.9 on 12/18/2015.  Blood glucose was 167. Patient is not on metformin or insulin products. - Consider adding a sliding scale insulin while in the hospital  Hypercalcemia Slightly elevated at 10.6.  Repeat BMET in the morning - BMET in the morning   Diet: NPO DVT prophylaxis: if patient tolerating PO restart home eliquis Code status:  Full code  Dispo: Admit patient to Observation with expected length of stay less than 2 midnights.  Signed: Camelia Phenes, DO 06/01/2016, 10:40 PM  Pager: [phone]

## 2016-06-02 ENCOUNTER — Encounter (HOSPITAL_COMMUNITY): Payer: Self-pay | Admitting: *Deleted

## 2016-06-02 DIAGNOSIS — I480 Paroxysmal atrial fibrillation: Secondary | ICD-10-CM

## 2016-06-02 DIAGNOSIS — I5032 Chronic diastolic (congestive) heart failure: Secondary | ICD-10-CM | POA: Diagnosis present

## 2016-06-02 DIAGNOSIS — R112 Nausea with vomiting, unspecified: Secondary | ICD-10-CM | POA: Diagnosis not present

## 2016-06-02 DIAGNOSIS — Z66 Do not resuscitate: Secondary | ICD-10-CM

## 2016-06-02 DIAGNOSIS — Z882 Allergy status to sulfonamides status: Secondary | ICD-10-CM

## 2016-06-02 DIAGNOSIS — Z79899 Other long term (current) drug therapy: Secondary | ICD-10-CM

## 2016-06-02 DIAGNOSIS — J90 Pleural effusion, not elsewhere classified: Secondary | ICD-10-CM

## 2016-06-02 DIAGNOSIS — Z9981 Dependence on supplemental oxygen: Secondary | ICD-10-CM

## 2016-06-02 DIAGNOSIS — E1122 Type 2 diabetes mellitus with diabetic chronic kidney disease: Secondary | ICD-10-CM

## 2016-06-02 DIAGNOSIS — I2510 Atherosclerotic heart disease of native coronary artery without angina pectoris: Secondary | ICD-10-CM

## 2016-06-02 DIAGNOSIS — Z940 Kidney transplant status: Secondary | ICD-10-CM

## 2016-06-02 DIAGNOSIS — R1011 Right upper quadrant pain: Secondary | ICD-10-CM

## 2016-06-02 DIAGNOSIS — Z955 Presence of coronary angioplasty implant and graft: Secondary | ICD-10-CM

## 2016-06-02 DIAGNOSIS — I130 Hypertensive heart and chronic kidney disease with heart failure and stage 1 through stage 4 chronic kidney disease, or unspecified chronic kidney disease: Secondary | ICD-10-CM

## 2016-06-02 DIAGNOSIS — Z7901 Long term (current) use of anticoagulants: Secondary | ICD-10-CM

## 2016-06-02 DIAGNOSIS — Z8249 Family history of ischemic heart disease and other diseases of the circulatory system: Secondary | ICD-10-CM

## 2016-06-02 DIAGNOSIS — K8020 Calculus of gallbladder without cholecystitis without obstruction: Secondary | ICD-10-CM | POA: Diagnosis not present

## 2016-06-02 DIAGNOSIS — Z8719 Personal history of other diseases of the digestive system: Secondary | ICD-10-CM

## 2016-06-02 DIAGNOSIS — N183 Chronic kidney disease, stage 3 (moderate): Secondary | ICD-10-CM

## 2016-06-02 DIAGNOSIS — Z794 Long term (current) use of insulin: Secondary | ICD-10-CM

## 2016-06-02 DIAGNOSIS — J9611 Chronic respiratory failure with hypoxia: Secondary | ICD-10-CM

## 2016-06-02 LAB — BASIC METABOLIC PANEL
Anion gap: 6 (ref 5–15)
BUN: 14 mg/dL (ref 6–20)
CALCIUM: 9.4 mg/dL (ref 8.9–10.3)
CHLORIDE: 111 mmol/L (ref 101–111)
CO2: 24 mmol/L (ref 22–32)
CREATININE: 0.99 mg/dL (ref 0.44–1.00)
GFR calc non Af Amer: 52 mL/min — ABNORMAL LOW (ref >60)
GFR, EST AFRICAN AMERICAN: 60 mL/min — AB (ref >60)
Glucose, Bld: 123 mg/dL — ABNORMAL HIGH (ref 65–99)
Potassium: 3.9 mmol/L (ref 3.5–5.1)
SODIUM: 141 mmol/L (ref 135–145)

## 2016-06-02 LAB — MRSA PCR SCREENING: MRSA BY PCR: POSITIVE — AB

## 2016-06-02 MED ORDER — PANTOPRAZOLE SODIUM 40 MG PO TBEC: 40.0000 mg | DELAYED_RELEASE_TABLET | Freq: Every day | ORAL | Status: DC

## 2016-06-02 MED ORDER — PANTOPRAZOLE SODIUM 40 MG PO TBEC
40.0000 mg | DELAYED_RELEASE_TABLET | Freq: Every day | ORAL | Status: DC
  Administered 2016-06-02 – 2016-06-04 (×3): 40 mg via ORAL
  Filled 2016-06-02 (×3): qty 1

## 2016-06-02 NOTE — Care Management Obs Status (Signed)
MEDICARE OBSERVATION STATUS NOTIFICATION   Patient Details  Name: [name] MRN: [id number] Date of Birth: November 28, 1931   Medicare Observation Status Notification Given:  Yes    [name], [name], RN 06/02/2016, 3:32 PM

## 2016-06-02 NOTE — Progress Notes (Addendum)
CRITICAL VALUE ALERT  Critical value received:  MSRA PCR (nasal swab) = positive  Date of notification:  06-02-2016  Time of notification:  09:38  Critical value read back:Yes.    Nurse who received alert:  Meridee ScoreAndy Brooklyn Alfredo, RN

## 2016-06-02 NOTE — Progress Notes (Signed)
Subjective: Patient continues to endorse nausea but has not had further episodes of vomiting since last night. Her nausea is worsened by clear liquid diet. She denies abdominal pain, diarrhea, constipation, worsening shortness of breath, chest pain.   Objective:  Vital signs in last 24 hours: Vitals:   06/01/16 2030 06/01/16 2300 06/02/16 0600 06/02/16 0932  BP: 133/60 (!) 144/65 (!) 148/69 (!) 138/58  Pulse: 60 67 67 74  Resp: 18 18 17 18   Temp:  97.8 F (36.6 C) 98.5 F (36.9 C) 97.8 F (36.6 C)  TempSrc:  Oral Oral Oral  SpO2: 100% 100% 100% 99%   Constitutional: was lying in bed but sat up and hunched over in middle of interview CV: RRR, no murmurs, rubs or gallops appreciated, pulses intact, no LE edema appreciated Resp: no increased work of breathing, decreased breath sounds in right lung base, otherwise no crackles or wheezing appreciated Abd: soft, +BS, NDNT MSK: moves all 4 extremities freely Neuro: CN 2-12 grossly intact, A&Ox3  Assessment/Plan:  Active Problems:   Nausea and vomiting  Nausea and vomiting:  Patient presenting with one day history of nausea and nonbloody, nonbilious vomiting after eating candied yams; she also reports symptoms of indigestion and acid reflux which she self-medicated with mallox and she is on Pepcid BID at home. RUQ u/s shows presence of cholelithiasis without evidence of cholecystitis but presentation is not consistent with effects of cholelithiasis. She likely has gastritis - will try trial of PPI as pepcid has been ineffective. 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 --tolerating clear liquids; will advance diet as tolerated; when tolerating adequate PO intake can D/C home with close f/u --PRN phenergan --stop pepcid as ineffective --trial of protonix 40mg  daily - made aware  by pharmacy of decrease levels of mycophenolate; will plan on close f/u with [facility] clinic for level check and adjustment   CKD, stage 3 s/p kidney transplant:  Patient on anti-rejection regimen of tacrolimus and mycophenolate. Kidney function stable on admission --continue current regimen --plan on close f/u with [facility] clinic for mycophenolate level monitoring and adjustment due to initiation of trial of PPI  Paroxysmal Atrial fibrillation:  Patient on amiodarone and Eliquis for stroke prophylaxis. Currently regular rhythm. --continue amiodarone and eliquis  CAD s/p stent: --stable, continue asa 81mg  daily  Actinomyces lung infection requiring supplemental oxygen:  Patient followed by Dr. Sherene SiresWert; treated with amoxicillin. Home oxygen requirement is 4L, no increased demand in hospital. --continue amoxicillin --continue supplemental oxygen at 4L --monitor for changes in respiratory status  Diastolic heart failure:  Stable. Patient has Echo 12/2015 showing EF 55-60%. On metoprolol 12.5mg  BID and Lasix 80mg  BID. Lasix has been held on admission due to concern for volume depletion with vomiting. Exam is not concerning for volume overload at this time. --continue metoprolol --hold lasix as PO intake is minimal at this time; will reconsider as increased PO intake  Hypertension:  Patient on home meds of metoprolol 12.5mg  BID and stable.  --continue home meds  Transient hypercalcemia:  Ca slightly elevated on admission at 10.6, f/u BMET shows normalization. --resolved  Code status: Discussion of code status was had with patient today. She expressed freely that she does not wish to be intubated. We had a discussion about the likelihood of resuscitation with chest compressions and shock in setting of her multiple  co morbidities and that the likelihood would be low of resuscitation and of return to previous health after cardiac arrest. Patient agrees that she does not see value in  that - Agrees with DNR status. Her goal is to return home, spend time with family, and help others like she had in the past before her recent hospitalizations for pneumonia this year.   Dispo: Anticipated discharge in approximately 1 day.   Nyra MarketGorica Tyton Abdallah, MD 06/02/2016, 2:33 PM Pager: Nyra MarketGorica Basel Defalco, MD [facility] - PGY1 Pager [phone]

## 2016-06-03 DIAGNOSIS — A420 Pulmonary actinomycosis: Secondary | ICD-10-CM | POA: Diagnosis not present

## 2016-06-03 DIAGNOSIS — K8010 Calculus of gallbladder with chronic cholecystitis without obstruction: Secondary | ICD-10-CM | POA: Diagnosis present

## 2016-06-03 DIAGNOSIS — K219 Gastro-esophageal reflux disease without esophagitis: Secondary | ICD-10-CM | POA: Diagnosis present

## 2016-06-03 DIAGNOSIS — Z87891 Personal history of nicotine dependence: Secondary | ICD-10-CM | POA: Diagnosis not present

## 2016-06-03 DIAGNOSIS — I5032 Chronic diastolic (congestive) heart failure: Secondary | ICD-10-CM | POA: Diagnosis present

## 2016-06-03 DIAGNOSIS — K529 Noninfective gastroenteritis and colitis, unspecified: Secondary | ICD-10-CM | POA: Diagnosis present

## 2016-06-03 DIAGNOSIS — J9611 Chronic respiratory failure with hypoxia: Secondary | ICD-10-CM | POA: Diagnosis present

## 2016-06-03 DIAGNOSIS — Z955 Presence of coronary angioplasty implant and graft: Secondary | ICD-10-CM | POA: Diagnosis not present

## 2016-06-03 DIAGNOSIS — Z79899 Other long term (current) drug therapy: Secondary | ICD-10-CM | POA: Diagnosis not present

## 2016-06-03 DIAGNOSIS — Z66 Do not resuscitate: Secondary | ICD-10-CM | POA: Diagnosis present

## 2016-06-03 DIAGNOSIS — K2970 Gastritis, unspecified, without bleeding: Secondary | ICD-10-CM | POA: Diagnosis present

## 2016-06-03 DIAGNOSIS — R112 Nausea with vomiting, unspecified: Secondary | ICD-10-CM | POA: Diagnosis not present

## 2016-06-03 DIAGNOSIS — N183 Chronic kidney disease, stage 3 (moderate): Secondary | ICD-10-CM | POA: Diagnosis present

## 2016-06-03 DIAGNOSIS — Z882 Allergy status to sulfonamides status: Secondary | ICD-10-CM | POA: Diagnosis not present

## 2016-06-03 DIAGNOSIS — Z7982 Long term (current) use of aspirin: Secondary | ICD-10-CM | POA: Diagnosis not present

## 2016-06-03 DIAGNOSIS — Z940 Kidney transplant status: Secondary | ICD-10-CM | POA: Diagnosis not present

## 2016-06-03 DIAGNOSIS — I252 Old myocardial infarction: Secondary | ICD-10-CM | POA: Diagnosis not present

## 2016-06-03 DIAGNOSIS — I480 Paroxysmal atrial fibrillation: Secondary | ICD-10-CM | POA: Diagnosis present

## 2016-06-03 DIAGNOSIS — I130 Hypertensive heart and chronic kidney disease with heart failure and stage 1 through stage 4 chronic kidney disease, or unspecified chronic kidney disease: Secondary | ICD-10-CM | POA: Diagnosis not present

## 2016-06-03 DIAGNOSIS — M109 Gout, unspecified: Secondary | ICD-10-CM | POA: Diagnosis present

## 2016-06-03 DIAGNOSIS — Z7901 Long term (current) use of anticoagulants: Secondary | ICD-10-CM | POA: Diagnosis not present

## 2016-06-03 DIAGNOSIS — I2510 Atherosclerotic heart disease of native coronary artery without angina pectoris: Secondary | ICD-10-CM | POA: Diagnosis present

## 2016-06-03 DIAGNOSIS — K8020 Calculus of gallbladder without cholecystitis without obstruction: Secondary | ICD-10-CM | POA: Diagnosis not present

## 2016-06-03 DIAGNOSIS — R1011 Right upper quadrant pain: Secondary | ICD-10-CM | POA: Diagnosis not present

## 2016-06-03 DIAGNOSIS — E1122 Type 2 diabetes mellitus with diabetic chronic kidney disease: Secondary | ICD-10-CM | POA: Diagnosis not present

## 2016-06-03 DIAGNOSIS — Z9981 Dependence on supplemental oxygen: Secondary | ICD-10-CM | POA: Diagnosis not present

## 2016-06-03 LAB — BASIC METABOLIC PANEL
Anion gap: 7 (ref 5–15)
BUN: 14 mg/dL (ref 6–20)
CHLORIDE: 105 mmol/L (ref 101–111)
CO2: 25 mmol/L (ref 22–32)
Calcium: 10.8 mg/dL — ABNORMAL HIGH (ref 8.9–10.3)
Creatinine, Ser: 1.05 mg/dL — ABNORMAL HIGH (ref 0.44–1.00)
GFR, EST AFRICAN AMERICAN: 56 mL/min — AB (ref >60)
GFR, EST NON AFRICAN AMERICAN: 48 mL/min — AB (ref >60)
Glucose, Bld: 122 mg/dL — ABNORMAL HIGH (ref 65–99)
POTASSIUM: 4.1 mmol/L (ref 3.5–5.1)
SODIUM: 137 mmol/L (ref 135–145)

## 2016-06-03 LAB — SEDIMENTATION RATE: SED RATE: 37 mm/h — AB (ref 0–22)

## 2016-06-03 MED ORDER — GI COCKTAIL ~~LOC~~
30.0000 mL | Freq: Once | ORAL | Status: AC
  Administered 2016-06-03: 30 mL via ORAL
  Filled 2016-06-03: qty 30

## 2016-06-03 MED ORDER — RAMELTEON 8 MG PO TABS
8.0000 mg | ORAL_TABLET | Freq: Every day | ORAL | Status: DC
  Administered 2016-06-03: 8 mg via ORAL
  Filled 2016-06-03 (×2): qty 1

## 2016-06-03 MED ORDER — ACETAMINOPHEN 325 MG PO TABS: 650.0000 mg | ORAL_TABLET | ORAL | Status: DC | PRN

## 2016-06-03 MED ORDER — MUPIROCIN 2 % EX OINT
1.0000 "application " | TOPICAL_OINTMENT | Freq: Two times a day (BID) | CUTANEOUS | Status: DC
  Administered 2016-06-03 – 2016-06-04 (×3): 1 via NASAL
  Filled 2016-06-03 (×2): qty 22

## 2016-06-03 MED ORDER — CHLORHEXIDINE GLUCONATE CLOTH 2 % EX PADS
6.0000 | MEDICATED_PAD | Freq: Every day | CUTANEOUS | Status: DC
  Administered 2016-06-03 – 2016-06-04 (×2): 6 via TOPICAL

## 2016-06-03 MED ORDER — AMLODIPINE BESYLATE 5 MG PO TABS
5.0000 mg | ORAL_TABLET | Freq: Every day | ORAL | Status: DC
  Administered 2016-06-03 – 2016-06-04 (×2): 5 mg via ORAL
  Filled 2016-06-03 (×2): qty 1

## 2016-06-03 NOTE — Progress Notes (Signed)
   Subjective: Patient endorses continuing episodes of nausea but no vomiting. She also endorses episodes of diffuse abdominal pain that are difficult to characterize and not associated with eating; these episodes resolve on their own. She has been able to keep down fluids and some soft foods; this morning she ate some eggs in small bites as bigger bites tended to cause some gagging. However, patient denies trouble swallowing, pain with swallowing or regurgitation. She denies mouth pain.   Objective:  Vital signs in last 24 hours: Vitals:   06/02/16 0932 06/02/16 1759 06/02/16 2248 06/03/16 0518  BP: (!) 138/58 133/63 (!) 148/66 (!) 150/65  Pulse: 74 65 77 98  Resp: 18 18 15 16   Temp: 97.8 F (36.6 C) 97.6 F (36.4 C) 98.5 F (36.9 C) 97.9 F (36.6 C)  TempSrc: Oral Oral Oral Oral  SpO2: 99% 98% 97% 100%  Weight:   151 lb 3.8 oz (68.6 kg)    Constitutional: NAD, appears more comfortable today ENT: oropharynx without lesions, exudates or erythema CV: RRR, no murmurs, rubs or gallops, no LE edema, pulses intact Resp: RLL crackles, no increased work of breathing, no wheezing Abd: soft, +BS, mild tenderness over site of transplanted kidney, otherwise NDNT Ext: moves all extremities freely Neuro: A&Ox3, CN grossly intact  Assessment/Plan:  Principal Problem:   Nausea and vomiting Active Problems:   S/P kidney transplant   CAD S/P remote RCA PCI   Cholelithiasis   Pleural effusion, bilateral   CKD (chronic kidney disease) stage 3, GFR 30-59 ml/min   PAF (paroxysmal atrial fibrillation) (HCC)   Chronic respiratory failure with hypoxia (HCC)   Chronic diastolic CHF (congestive heart failure) (HCC)  Nausea:  Vomiting resolved but still having some nausea. Not much relief with phenergan or PPI (but only one dose). Still keeping low threshold for possible recurrence of candidal esophagitis that in the past has presented with N/V/D. She is tolerating liquids, and some intake of solids  but not adequate (<25% of meals eaten).  --continue PPI; phenergan PRN --GI cocktail - if not effective may start diflucan to treat candidal esophagitis --consider rechecking mycophenolate level  --will reassess later today, for possible discharge  CKD, stage 3 s/p kidney transplant: Patient on anti-rejection regimen of tacrolimus and mycophenolate. Kidney function stable on admission --Continue current regimen --plan close f/u with [facility] clinic for mycophenolate level monitoring and adjustment due to initiation of trial of PPI; may check level here  PAF: Stable, NSR --Continue amiodarone and eliquis  CAD s/p stent:  --stable, continue asa 81mg  daily  Actinomyces lung infection requiring supplemental oxygen: Followed by Dr. Sherene SiresWert; treated with amoxicillin. No increased oxygen requirement from home regimen of 4L [place]. Stable --continue amoxicillin --supplemental O2 at 4L --monitor for changes in respiratory status  Diastolic heart failure: Appears euvolemic on exam. --continue metoprolol --hold lasix - resume on d/c  Hypertension: Pt BP has been elevated up to 150 SBP this AM. At home on metoprolol 12.5mg  BID and amlodipine 5mg  --Continue metoprolol 12.5 mg BID and add amlodipine 5mg  daily   Dispo: Anticipated discharge in approximately 1 day(s).   Nyra MarketGorica Kennice Finnie, MD 06/03/2016, 6:52 AM Pager: Nyra MarketGorica Reniah Cottingham, MD [facility] - PGY1 Pager [phone]

## 2016-06-03 NOTE — Progress Notes (Signed)
Internal Medicine Attending:   I saw and examined the patient. I reviewed the resident's note and I agree with the resident's findings and plan as documented in the resident's note. On Exam, appears in NAD Oropharynx clear RRR no murmur Bilateral lower lobe crackles Positive bowel sounds  Nausea, mildly improved, no emesis.  Will try GI cocktail. Check Mycophenolate level for toxicity. Other issues per Dr Kandice HamsSvalina's note.

## 2016-06-03 NOTE — Progress Notes (Signed)
Pt with episode of nausea this am upon wakening. Admin of 12.5 mg of phenergan per prn order

## 2016-06-04 LAB — BASIC METABOLIC PANEL
Anion gap: 7 (ref 5–15)
BUN: 15 mg/dL (ref 6–20)
CALCIUM: 10.6 mg/dL — AB (ref 8.9–10.3)
CO2: 26 mmol/L (ref 22–32)
CREATININE: 1.11 mg/dL — AB (ref 0.44–1.00)
Chloride: 104 mmol/L (ref 101–111)
GFR calc non Af Amer: 45 mL/min — ABNORMAL LOW (ref >60)
GFR, EST AFRICAN AMERICAN: 52 mL/min — AB (ref >60)
GLUCOSE: 94 mg/dL (ref 65–99)
Potassium: 4.1 mmol/L (ref 3.5–5.1)
Sodium: 137 mmol/L (ref 135–145)

## 2016-06-04 LAB — MYCOPHENOLIC ACID (CELLCEPT)
MPA Glucuronide: 51 ug/mL (ref 15–125)
MPA: 2.1 ug/mL (ref 1.0–3.5)

## 2016-06-04 MED ORDER — METOPROLOL TARTRATE 25 MG PO TABS: 12.5000 mg | ORAL_TABLET | Freq: Two times a day (BID) | ORAL | 0 refills | Status: DC

## 2016-06-04 MED ORDER — ENSURE ENLIVE PO LIQD
237.0000 mL | Freq: Two times a day (BID) | ORAL | Status: DC
  Administered 2016-06-04: 237 mL via ORAL

## 2016-06-04 MED ORDER — AMOXICILLIN 500 MG PO CAPS
500.0000 mg | ORAL_CAPSULE | Freq: Three times a day (TID) | ORAL | Status: DC
  Administered 2016-06-04: 500 mg via ORAL
  Filled 2016-06-04 (×2): qty 1

## 2016-06-04 MED ORDER — ENSURE ENLIVE PO LIQD: 237.0000 mL | Freq: Two times a day (BID) | ORAL | 12 refills | Status: DC

## 2016-06-04 MED ORDER — PANTOPRAZOLE SODIUM 40 MG PO TBEC: 40.0000 mg | DELAYED_RELEASE_TABLET | Freq: Every day | ORAL | 0 refills | Status: DC

## 2016-06-04 NOTE — Care Management Note (Signed)
Case Management Note  Patient Details  Name: Unknown FoleyMae W Dost MRN: [id number] Date of Birth: 07-Jul-1932  Subjective/Objective:                 Patient from home with family. No CM needs identified. Will DC to home with family today.    Action/Plan:   Expected Discharge Date:                  Expected Discharge Plan:  Home/Self Care  In-House Referral:  NA  Discharge planning Services  CM Consult  Post Acute Care Choice:  NA Choice offered to:  NA  DME Arranged:  N/A DME Agency:  NA  HH Arranged:  NA HH Agency:  NA  Status of Service:  Completed, signed off  If discussed at Long Length of Stay Meetings, dates discussed:    Additional Comments:  Lawerance SabalDebbie Simra Fiebig, RN 06/04/2016, 2:45 PM

## 2016-06-04 NOTE — Final Progress Note (Signed)
Patient discharge teaching given, including activity, diet, follow-up appoints, and medications. Patient verbalized understanding of all discharge instructions. IV access was d/c'd. Vitals are stable. Skin is intact except as charted in most recent assessments. Pt to be escorted out by SN, to be driven home by family. 

## 2016-06-04 NOTE — Progress Notes (Signed)
Internal Medicine Attending:   I saw and examined the patient. I reviewed the resident's note and I agree with the resident's findings and plan as documented in the resident's note. Patient feels much better, no vomiting, able to tolerate food.  Mycophenolate has returned in theraputic range.  Likely cause of her N/V is gastritis will due very short course of PPI due to Drug interactions.  Will have close follow up in clinic next week, otherwise stable for discharge.

## 2016-06-04 NOTE — Discharge Instructions (Signed)
[facility] visit on Nov 8th at 10:15. If you cannot make this visit, please call the clinic and reschedule.   Please keep your appointment with your lung doctor on Nov 10th.   Information on my medicine - ELIQUIS (apixaban)  This medication education was reviewed with me or my healthcare representative as part of my discharge preparation.    Why was Eliquis prescribed for you? Eliquis was prescribed for you to reduce the risk of forming blood clots that can cause a stroke if you have a medical condition called atrial fibrillation (a type of irregular heartbeat) OR to reduce the risk of a blood clots forming after orthopedic surgery.  What do You need to know about Eliquis ? Take your Eliquis TWICE DAILY - one tablet in the morning and one tablet in the evening with or without food.  It would be best to take the doses about the same time each day.  If you have difficulty swallowing the tablet whole please discuss with your pharmacist how to take the medication safely.  Take Eliquis exactly as prescribed by your doctor and DO NOT stop taking Eliquis without talking to the doctor who prescribed the medication.  Stopping may increase your risk of developing a new clot or stroke.  Refill your prescription before you run out.  After discharge, you should have regular check-up appointments with your healthcare provider that is prescribing your Eliquis.  In the future your dose may need to be changed if your kidney function or weight changes by a significant amount or as you get older.  What do you do if you miss a dose? If you miss a dose, take it as soon as you remember on the same day and resume taking twice daily.  Do not take more than one dose of ELIQUIS at the same time.  Important Safety Information A possible side effect of Eliquis is bleeding. You should call your healthcare provider right away if you experience any of the following: ? Bleeding from an  injury or your nose that does not stop. ? Unusual colored urine (red or dark brown) or unusual colored stools (red or black). ? Unusual bruising for unknown reasons. ? A serious fall or if you hit your head (even if there is no bleeding).  Some medicines may interact with Eliquis and might increase your risk of bleeding or clotting while on Eliquis. To help avoid this, consult your healthcare provider or pharmacist prior to using any new prescription or non-prescription medications, including herbals, vitamins, non-steroidal anti-inflammatory drugs (NSAIDs) and supplements.  This website has more information on Eliquis (apixaban): [email or web address].

## 2016-06-04 NOTE — Progress Notes (Signed)
   Subjective: Patient endorses feeling overall better today. She is sitting up comfortably with lunch tray and conversing more freely. She feels frustration that she is still having trouble with clearing the lung infection despite being compliant with antibiotics for months. She has an appointment with pulmonology next week.   She endorses poor PO intake at home since her hospitalizations earlier this year. She does supplement with Ensure.  Objective:  Vital signs in last 24 hours: Vitals:   06/03/16 2112 06/04/16 0507 06/04/16 0735 06/04/16 1055  BP: (!) 153/68 139/68 (!) 146/65   Pulse: 72 66 67   Resp: 18 16 16    Temp: 98.1 F (36.7 C) 97.5 F (36.4 C) 97.6 F (36.4 C)   TempSrc: Oral Oral Oral   SpO2: 92% 94% 95% 98%  Weight: 160 lb 8 oz (72.8 kg)     Height: 5\' 3"  (1.6 m)      Constitutional: NAD, appears even more comfortable today CV: RRR, no murmurs, rubs or gallops, no LE edema, pulses intact Resp: RLL crackles, no increased work of breathing, no wheezing Abd: soft, +BS, mild tenderness over site of transplanted kidney, otherwise NDNT Ext: moves all extremities freely Neuro: A&Ox3, CN grossly intact  Assessment/Plan:  Principal Problem:   Nausea and vomiting Active Problems:   S/P kidney transplant   CAD S/P remote RCA PCI   Cholelithiasis   Pleural effusion, bilateral   CKD (chronic kidney disease) stage 3, GFR 30-59 ml/min   PAF (paroxysmal atrial fibrillation) (HCC)   Chronic respiratory failure with hypoxia (HCC)   Chronic diastolic CHF (congestive heart failure) (HCC)  Nausea: resolved  Has improved after PPI and GI cocktail administration - likely etiology of initial N/V was gastritis. Ordered mycophenolate level as her presenting symptoms could have been caused by mycophenolate toxicity, but since she has recovered this is less likely. --continue PPI x 5 days post discharge  CKD, stage 3 s/p kidney transplant: Patient on anti-rejection regimen of  tacrolimus and mycophenolate. Kidney function stable on admission --Continue current regimen --plan close f/u with [facility] for mycophenolate level monitoring and adjustment due to initiation of trial of PPI --checking mycophenolate level as thought toxicity might have led to N/V  PAF: Stable, NSR --Continue amiodarone and eliquis  CAD s/p stent:  --stable, continue asa 81mg  daily  Actinomyces lung infection requiring supplemental oxygen: Followed by Dr. Sherene SiresWert; treated with amoxicillin. No increased oxygen requirement from home regimen of 4L [place]. Stable --continue amoxicillin --supplemental O2 at 4L  Diastolic heart failure: Appears euvolemic on exam. --continue metoprolol --held lasix during hospitalization- resume on d/c  Hypertension: At home on metoprolol 12.5mg  BID and amlodipine 5mg  --Continue home meds   Dispo: Anticip[facility] discharge today [facility]   Nyra MarketGorica Anjalina Bergevin, MD 06/04/2016, 3:40 PM Pager [phone]

## 2016-06-05 NOTE — Discharge Summary (Signed)
Name: [name] MRN: [id number] DOB: Jan 07, 1932 82 y.o. PCP: Joy Brickiana M Truong, MD  Date of Admission: 06/01/2016  3:59 PM Date of Discharge: 06/05/2016 Attending Physician: No att. providers found  Discharge Diagnosis: 1. Nausea and vomiting Principal Problem:   Nausea and vomiting Active Problems:   S/P kidney transplant   CAD S/P remote RCA PCI   Cholelithiasis   Pleural effusion, bilateral   CKD (chronic kidney disease) stage 3, GFR 30-59 ml/min   PAF (paroxysmal atrial fibrillation) (HCC)   Chronic respiratory failure with hypoxia (HCC)   Chronic diastolic CHF (congestive heart failure) (HCC)   Discharge Medications:   Medication List    TAKE these medications   allopurinol 100 MG tablet Commonly known as:  ZYLOPRIM Take 100 mg by mouth daily.   amiodarone 200 MG tablet Commonly known as:  PACERONE Take 1 tablet (200 mg total) by mouth 2 (two) times daily.   amLODipine 10 MG tablet Commonly known as:  NORVASC Take 5 mg by mouth daily.   amoxicillin 500 MG capsule Commonly known as:  AMOXIL Take 1 capsule (500 mg total) by mouth 3 (three) times daily.   apixaban 2.5 MG Tabs tablet Commonly known as:  ELIQUIS Take 1 tablet (2.5 mg total) by mouth 2 (two) times daily. Cancel previous Eliquis prescription   aspirin EC 81 MG tablet Take 81 mg by mouth daily.   cinacalcet 90 MG tablet Commonly known as:  SENSIPAR Take 22.5 mg by mouth See admin instructions. Once every one to two weeks   famotidine 20 MG tablet Commonly known as:  PEPCID Take 20 mg by mouth 2 (two) times daily.   feeding supplement (ENSURE ENLIVE) Liqd Take 237 mLs by mouth 2 (two) times daily between meals.   furosemide 80 MG tablet Commonly known as:  LASIX Take 80 mg by mouth 2 (two) times daily.   metoprolol tartrate 25 MG tablet Commonly known as:  LOPRESSOR Take 0.5 tablets (12.5 mg total) by mouth 2 (two) times daily. What changed:  how much to take  Another medication  with the same name was removed. Continue taking this medication, and follow the directions you see here.   mycophenolate 250 MG capsule Commonly known as:  CELLCEPT Take 500 mg by mouth 2 (two) times daily.   pantoprazole 40 MG tablet Commonly known as:  PROTONIX Take 1 tablet (40 mg total) by mouth daily. For 5 days   simvastatin 20 MG tablet Commonly known as:  ZOCOR Take 20 mg by mouth every evening.   tacrolimus 1 MG capsule Commonly known as:  PROGRAF Take 1 mg by mouth 2 (two) times daily. (To be taken in conjunction with one 0.5 mg capsule to equal a total of 1.5 milligrams/each dose) What changed:  Another medication with the same name was changed. Make sure you understand how and when to take each.   tacrolimus 0.5 MG capsule Commonly known as:  PROGRAF 1 mg in the morning and 1 mg at bedtime What changed:  how much to take  how to take this  when to take this  additional instructions       Disposition and follow-up:   [name] was discharged [facility] in Good condition.  At the hospital follow up visit please address:  1.   -patient was placed on short term (5 days only) course of PPI. At completion can resume pepcid for acid reduction. -PPI can lower mycophenolate level leading to  increased risk of rejection; please make certain she follows up with [facility] transplant clinic for evaluation of levels and dosing -has the patient followed up with pulmonology for re-evaluation of lung infection and continued shortness of breath? -patient has changed code status to DNR and was provided with DRN form; with multiple co-morbidities please continue discussion of goals of care (stated wanted to be at home and go back to helping people)  2.  Labs / imaging needed at time of follow-up: -mycophenolate level at [facility] abdominal transplant clinic -consider renal ultrasound (CT from 05/21/2016 showed an increasing mass in upper pole of native left kidney  that is 3.2cm in size - could represent cyst or solid renal mass).   3.  Pending labs/ test needing follow-up: none  Follow-up Appointments: Follow-up Information    [facility]. Go on 06/09/2016.   Why:  at 10:15 Contact information: [address] [place] [address] [phone]       [facility] Transplant clinic .   Why:  Please call and make an appointment with your transplant clinic to f/u on your medication dosing next week.          Hospital Course by problem list: Principal Problem:   Nausea and vomiting Active Problems:   S/P kidney transplant   CAD S/P remote RCA PCI   Cholelithiasis   Pleural effusion, bilateral   CKD (chronic kidney disease) stage 3, GFR 30-59 ml/min   PAF (paroxysmal atrial fibrillation) (HCC)   Chronic respiratory failure with hypoxia (HCC)   Chronic diastolic CHF (congestive heart failure) (HCC)   Nausea and vomiting 2/2 gastritis: Patient presented on admission with one day history of nausea and vomiting. Day before admission, had candied yam; she woke up the following morning with some abdominal discomfort and took milk of magnesia which prompted a bowel movement. Later, she took her antibiotics and immediately started having nausea and vomiting up to 10 episodes before presenting to the ED. On presentation, patient was afebrile, no leukocytosis, no elevation of LFT's, and lipase wnl. RUQ u/s was obtained which showed cholelithiasis but no cholecystitis. Patient has multiple comorbidities that arose concern such as s/p kidney transplant on immunosuppression which could predispose to opportunistic infections and mask lab findings of signs of infection, as well as cause side effects through toxicity. Amoxacillin therapy for her actinomyces lung infection could also have side-effects of nausea and vomiting. She also has a history of candidal esophagitis which presented with N/V/D in the past. Her mycophemolate  level was checked for toxicity and returned within normal range. Patient's presentation was consistent with gastritis; she did not have any episodes of vomiting while admitted. She was treated with short course of PPI and GI cocktail after which patient's nausea improved. She was also given supportive IVF. She was discharged home with close PCP follow up.   CKD, stage 3 s/p kidney transplant: Patient s/p kidney transplant in 2003, followed by [facility]; on tacrolimus and mycophenolate for anti-rejection. Kidney function was stable throughout admission and her home regimen was continued. Patient discharged with short course of PPI for gastritis; this requires close f/u with transplant clinic for monitoring of mycophenolate level as it can be decreased with PPI therapy and increase risk for rejection. She was instructed to f/u with them.  Paroxsymal Atrial Fibrillation: Patient on amiodarone and eliquis; she continued to be in sinus rhythm throughout admission. Her medications were continued.  CAD: Patient with history of CAD on  ASA 81mg  daily which was continued.  Actinomyces lung infection requiring supplemental oxygen: Patient followed by Dr. Sherene SiresWert, and treated with daily amoxicillin. Her home dose of oxygen at 4L and remained so throughout admission. Her amoxicillin and O2 were continued. She has an appointment with Dr. Sherene SiresWert next week which we encouraged her to keep for re-evaluation.   Diastolic heart failure: Patient with history of diastolic heart failure on metoprolol and lasix at home. She appeared euvolemic throughout admission and lasix was initially held due to vomiting but continued on discharge.  Hypertension: Patient on home metoprolol 12.5mg  BID and amlodipine 5mg  daily which were continued. Her blood pressure remained stable.  Discharge Vitals:   BP (!) 146/65 (BP Location: Left Arm)   Pulse 67   Temp 97.6 F (36.4 C) (Oral)   Resp 16   Ht 5\' 3"  (1.6 m)    Wt 160 lb 8 oz (72.8 kg)   SpO2 98%   BMI 28.43 kg/m   Pertinent Labs, Studies, and Procedures:  CBC Latest Ref Rng & Units 06/01/2016 03/12/2016 02/03/2016  WBC 4.0 - 10.5 K/uL 8.2 7.9 9.6  Hemoglobin 12.0 - 15.0 g/dL 16.112.7 11.8(L) 12.9  Hematocrit 36.0 - 46.0 % 39.7 35.9(L) 39.9  Platelets 150 - 400 K/uL 196 241.0 244   BMP Latest Ref Rng & Units 06/04/2016 06/03/2016 06/02/2016  Glucose 65 - 99 mg/dL 94 096(E122(H) 454(U123(H)  BUN 6 - 20 mg/dL 15 14 14   Creatinine 0.44 - 1.00 mg/dL 9.81(X1.11(H) 9.14(N1.05(H) 8.290.99  BUN/Creat Ratio 12 - 28 - - -  Sodium 135 - 145 mmol/L 137 137 141  Potassium 3.5 - 5.1 mmol/L 4.1 4.1 3.9  Chloride 101 - 111 mmol/L 104 105 111  CO2 22 - 32 mmol/L 26 25 24   Calcium 8.9 - 10.3 mg/dL 10.6(H) 10.8(H) 9.4   Abd xray 06/01/2016: Non specific decreased bowel gas Small right-sided pleural effusion with partially visualized consolidation at the right lung base. Small left-sided pleural effusion with basilar atelectasis  U/S RUQ 06/01/2016: Cholelithiasis; no sonographic evidence of acute cholecystitis Right pleural effusion   Discharge Instructions: Discharge Instructions    Diet - low sodium heart healthy    Complete by:  As directed    Increase activity slowly    Complete by:  As directed       Signed: Nyra MarketGorica Nylen Creque, MD 06/05/2016, 3:43 PM   Pager [phone]

## 2016-06-09 ENCOUNTER — Ambulatory Visit: Payer: Medicare Other

## 2016-06-09 ENCOUNTER — Telehealth: Payer: Self-pay

## 2016-06-11 ENCOUNTER — Ambulatory Visit (INDEPENDENT_AMBULATORY_CARE_PROVIDER_SITE_OTHER): Payer: Medicare Other | Admitting: Internal Medicine

## 2016-06-11 ENCOUNTER — Ambulatory Visit (INDEPENDENT_AMBULATORY_CARE_PROVIDER_SITE_OTHER)
Admission: RE | Admit: 2016-06-11 | Discharge: 2016-06-11 | Disposition: A | Discharge status: Home / Self Care | Payer: Medicare Other | Source: Ambulatory Visit | Attending: Internal Medicine | Admitting: Internal Medicine

## 2016-06-11 ENCOUNTER — Encounter: Payer: Self-pay | Admitting: Internal Medicine

## 2016-06-11 VITALS — BP 118/66 | HR 56 | Ht 63.0 in | Wt 151.6 lb

## 2016-06-11 DIAGNOSIS — J90 Pleural effusion, not elsewhere classified: Secondary | ICD-10-CM

## 2016-06-11 DIAGNOSIS — Z9861 Coronary angioplasty status: Secondary | ICD-10-CM | POA: Diagnosis not present

## 2016-06-11 DIAGNOSIS — J9611 Chronic respiratory failure with hypoxia: Secondary | ICD-10-CM

## 2016-06-11 DIAGNOSIS — A420 Pulmonary actinomycosis: Secondary | ICD-10-CM

## 2016-06-11 DIAGNOSIS — I2510 Atherosclerotic heart disease of native coronary artery without angina pectoris: Secondary | ICD-10-CM

## 2016-06-11 MED ORDER — AMOXICILLIN 500 MG PO CAPS: 500.0000 mg | ORAL_CAPSULE | Freq: Three times a day (TID) | ORAL | 1 refills | Status: DC

## 2016-06-11 NOTE — Progress Notes (Signed)
Subjective:     Patient ID: [name], female   DOB: [birth year],  .   MRN: [id number]    History of Present Illness [age] yobf minimal smoking quit around 1970  with seen for pulmonary consult during hospitalization May 2017 with a pneumococcal pneumonia with associated bacteremia and  R effusion / acute hypoxic respiratory failure. She has stage II chronic kidney disease status post renal transplant, diabetes, chronic diastolic congestive heart failure and atrial fib with RVR, on Eliquis.   History of Present Illness  02/12/2016 Pneumonia/Pleural Effusion Follow Up: Pt. Presents  for follow up for pleural effusion and pneumonia diagnosed  12/18/2015. Patient was admitted May 2017 for pneumococcal pneumonia with associated bacteremia and acute hypoxic respiratory failure. Hospitalization was compensated by atrial filb with RVR and NSTEMI demand ischemia. She was seen for pulmonary consult during this hospitalization. She was found to have a large right exudative pleural effusion-felt to be parapneumonic. She underwent thoracentesis times 2 with 1460 cc and then 800cc . Cytology neg for malignant cells.  Initial CT chest on May 23 showed a large right pleural effusion with right lower lobe collapse Repeat CT chest on May 31 showed complete atelectasis of the right lower lobe with significant atelectasis of the right middle lobe. The proximal right middle lobe bronchus appears occluded. Small to moderate right pleural effusion is decreased from previous CT scan. She was treated with antibiotics and diuretics were adjusted. . 2 D echo showed w/ EF 55%, mod LA dilation, mod TV regurg, PAP 44.  She was recently hospitalized July 4th for atrial fib with RVR. She did not want cardioversion. She has been compliant with her Eliquis. CXR today indicates continued atelectasis or infiltrate. Pt. States she continues to have shortness of  breath with activity.She is using her oxygen at 2L [place] 24/7. She is  interested in weaning off the oxygen, but I told her that since she continues to have dyspnea, we need to continue oxygen therapy until she is better.She states she continues to cough up green secretions with some blood. She states she has been coughing up blood since before her first hospital admission. She denies fever, chest pain, or orthopnea, leg or calf pain or tenderness. rec augmentin x 7 more days    03/12/2016  f/u ov/[name] re: s/p pneumocal pna/ sepsis/ R>>L effusions Chief Complaint  Patient presents with  . Follow-up    CXR today. Pt states she has right lateral rib pain when moving. Pt denies pain on left lateral rib side. Pt c/o fatigue. Pt denies cough, f/c/s and CP/tightness.   Overall improved with breathing and no cough or pleuritic cp. She has a new R flank discomfort that is exquistely positional, better on tylenol, "feels like she pullned a muscle with acute onset 3 d prior to OV  And no assoc n or v or anorexia. Sob or fever or rash or change with breath or cough. rec Please remember to go to the lab   department downstairs for your tests - we will call you with the results when they are available. Stay on 02  But reduce to 1lpm    04/26/2016  f/u ov/[name] re:  Chief Complaint  Patient presents with  . Follow-up    CXR done today. Pt states cough has been worse with large amounts of thick, blood streaked sputum for the past wk.   sob x 50 ft  Sleeps on 2 pillows and no worse than usual Cough worse x  one month  Congested sometimes green esp in am  rec Augmentin 875 mg take one pill twice daily  X 10 days -  You will need need to stop the eliquis x 3 days before the thoracentesis done 04/29/16 x 900 cc transudate with good reexpansion of RLL on f/u cxr      05/06/2016  f/u ov/[name] re:  R pleural effusion  Chief Complaint  Patient presents with  . Follow-up    Breathing improved after thoracentesis 04/30/16, and slowly worsening over the past 2 days. She feels very  tired today.   never had orthopnea, either before or after tap. Green mucus resolved but now sltly bloody am mucus on eliquis  rec Please remember to go to the  x-ray department downstairs for your tests - we will call you with the results when they are available. 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 Follow-up    Breathing is unchanged. CXR repeated today.    actually feels better though cxr shows the effusion is back just like it was before   no cough / fever/ sleeps 2 pillows   No obvious day to day or daytime variability or assoc excess/ purulent sputum or mucus plugs or  chest tightness, subjective wheeze or overt sinus or hb symptoms. No unusual exp hx or h/o childhood pna/ asthma or knowledge of premature birth.  Sleeping ok without nocturnal  or early am exacerbation  of respiratory  c/o's or need for noct saba. Also denies any obvious fluctuation of symptoms with weather or environmental changes or other aggravating or alleviating factors except as outlined above   Current Medications, Allergies, Complete Past Medical History, Past Surgical History, Family History, and Social History were reviewed in [facility] electronic medical record.  ROS  The following are not active complaints unless bolded sore throat, dysphagia, dental problems, itching, sneezing,  nasal congestion or excess/ purulent secretions, ear ache,   fever, chills, sweats, unintended wt loss, classically pleuritic or  exertional cp,  orthopnea pnd or leg swelling bilaterally sym presyncope, palpitations, abdominal pain, anorexia, nausea, vomiting, diarrhea  or change in bowel or bladder habits, change in stools or urine, dysuria,hematuria,  rash, arthralgias, visual complaints, headache, numbness, weakness or ataxia or problems with walking or coordination,  change in mood/affect or memory.          Significant events/studies:  12/19/2015>>2 D echo showed w/ EF 55%, mod LA dilation, mod TV regurg, PAP 44.  12/23/15>> CT scan on 5/23 showed showed a large right pleural effusion with right lower lobe collapse and patchy airspace disease of the left lower lobe, finding suspicion for aspiration pneumonia, endobronchial lesion causing collapse of the right lower lobe is also in the differential diagnosis    12/24/15>> R Thorocentesis>> 1640 cc clear yellow, most likely exudative by LDH. ( 10 days IV Rocephin) 12/28/15>> Reaccumulation>> R parapneumonic effusion and had another 84080mL's of pleural fluid removed( ABX resumed) 12/31/15>> Repeat CT scan of  the chest without contrast on 5/31 showed no convincing right lower lobe mass, small to moderate right pleural effusion. Pulmonary recommended aggressive diuresis to help manage her pleural effusion Patient d/c  another 5 days of Augmentin. 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 ENT: No sinus tenderness, TM clear, pale nasal mucosa, no oral exudate,no post nasal drip, no LAN Cardiac: S1, S2, regular rate and rhythm, no murmur Chest: No wheeze/ rales/ diminished  Base on R   with dullnes ; no accessory muscle use, no nasal flaring, no sternal retractions Abd.: Soft Non-tender Ext: No clubbing cyanosis, - 1+  sym pitting ankle edema  Neuro:  normal strength Skin: No rashes, warm and dry Psych: normal mood and behavior     CXR PA and Lateral:   06/11/2016 :    I personally reviewed images and agree with radiology impression as follows:    1. Moderate right pleural effusion, increased in size relative to most recent radiograph from 05/20/2016. Associated right basilar opacity likely reflects atelectasis. 2. Trace left pleural effusion. 3. Stable cardiomegaly without pulmonary edema        Assessment:

## 2016-06-11 NOTE — Patient Instructions (Addendum)
Resume amoxicillin 250 mg three times daily x 6 weeks (refill when run out)  Call if breathing worse and we can do one more drainage to get you thru the holidays but I will be referring you to a thoracic surgeon in the meantime for a second opinion  See Dr Briant CedarMattingly for adjustment of your fluid meds  Please schedule a follow up office visit in 6 weeks, call sooner if needed

## 2016-06-13 ENCOUNTER — Encounter: Payer: Self-pay | Admitting: Internal Medicine

## 2016-06-13 DIAGNOSIS — A420 Pulmonary actinomycosis: Secondary | ICD-10-CM | POA: Insufficient documentation

## 2016-06-13 NOTE — Assessment & Plan Note (Signed)
rx with 02 at d/c from strep pna May 2017  =As of 03/12/2016 ok on 1lpm 24/7  - 06/11/2016 sats ok RA at rest > rec continue 02 at hs only and prn daytime

## 2016-06-13 NOTE — Assessment & Plan Note (Signed)
I had an extended discussion with the patient/ daughter reviewing all relevant studies completed to date and  lasting 15 to 20 minutes of a 25 minute visit on the following ongoing concerns:   She still has significant volume overload clinically with a transudative effusion that should respond to being "dried out" if her kidney function would permit it  Not clear the actinomyces has anything to do with the effusion but on the rec of Dr Synthia InnocentVan Damm, since she is immunocomprised, have rec 6 more weeks of amox tid until she returns or she undergoes vats /repeat fob and for that I've referred her to T surgery.  If the effusion can be managed medically in the meantime and the RLL re-expands, perhaps this can be avoided but at this point the RLL has been atelectatic for so long it may not occur without surgical intervention.  Each maintenance medication was reviewed in detail including most importantly the difference between maintenance and as needed and under what circumstances the prns are to be used.  Please see instructions for details which were reviewed in writing and the patient given a copy.

## 2016-06-13 NOTE — Telephone Encounter (Signed)
Unable to reach patient.

## 2016-06-13 NOTE — Assessment & Plan Note (Addendum)
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

## 2016-06-17 ENCOUNTER — Encounter: Payer: Self-pay | Admitting: Internal Medicine

## 2016-06-17 ENCOUNTER — Ambulatory Visit (INDEPENDENT_AMBULATORY_CARE_PROVIDER_SITE_OTHER): Payer: Medicare Other | Admitting: Internal Medicine

## 2016-06-17 VITALS — BP 135/58 | HR 57 | Temp 97.5°F | Ht 63.0 in | Wt 153.3 lb

## 2016-06-17 DIAGNOSIS — I480 Paroxysmal atrial fibrillation: Secondary | ICD-10-CM

## 2016-06-17 DIAGNOSIS — Z87891 Personal history of nicotine dependence: Secondary | ICD-10-CM

## 2016-06-17 DIAGNOSIS — Z940 Kidney transplant status: Secondary | ICD-10-CM

## 2016-06-17 DIAGNOSIS — M109 Gout, unspecified: Secondary | ICD-10-CM | POA: Diagnosis not present

## 2016-06-17 DIAGNOSIS — Z7982 Long term (current) use of aspirin: Secondary | ICD-10-CM

## 2016-06-17 DIAGNOSIS — Z9981 Dependence on supplemental oxygen: Secondary | ICD-10-CM

## 2016-06-17 DIAGNOSIS — Z09 Encounter for follow-up examination after completed treatment for conditions other than malignant neoplasm: Secondary | ICD-10-CM | POA: Diagnosis present

## 2016-06-17 DIAGNOSIS — Z8719 Personal history of other diseases of the digestive system: Secondary | ICD-10-CM

## 2016-06-17 DIAGNOSIS — Z7189 Other specified counseling: Secondary | ICD-10-CM

## 2016-06-17 DIAGNOSIS — A420 Pulmonary actinomycosis: Secondary | ICD-10-CM

## 2016-06-17 DIAGNOSIS — E212 Other hyperparathyroidism: Secondary | ICD-10-CM | POA: Diagnosis not present

## 2016-06-17 DIAGNOSIS — E785 Hyperlipidemia, unspecified: Secondary | ICD-10-CM | POA: Diagnosis not present

## 2016-06-17 MED ORDER — AMIODARONE HCL 200 MG PO TABS: 200.0000 mg | ORAL_TABLET | Freq: Two times a day (BID) | ORAL | 12 refills | Status: DC

## 2016-06-17 NOTE — Progress Notes (Signed)
   CC: gastritis  HPI:  [name] is a 82 y.o. female who presents today for gastritis. Please see assessment & plan for status of chronic medical problems.   Past Medical History:  Diagnosis Date  . Candidal esophagitis (HCC)    a. 03/2014.  Marland Kitchen. Cholelithiasis   . Chronic diastolic CHF (congestive heart failure) (HCC)    a. 06/2011 Echo: EF 60-65%, no rwma, Gr1 DD, mild TR. // b. Echo 12/19/15: Severe LVH, EF 55-60%, normal wall motion, mild MR, moderate LAE, moderate TR, PASP 44 mmHg  . CKD (chronic kidney disease), stage III    a. in setting of prior ESRD and cadaveric tx in 2003.  Marland Kitchen. Coronary artery disease    a. 01/2001 Cath/PCI: LAD 50p, 4553m, D1 50-60, RCA 7293m (3.0x13 BX Velocity BMS).// b. NSTEMI 5/17 (demand ischemia) in setting of AF with RVR, pneumonia, CKD, a/c HF >> Myoview 12/26/15: prob inf infarct, no ischemia, EF 52%, Low Risk >> med Rx  . Gastritis    a. 03/2014  . Gout    unconfirmed by joint aspiration  . History of bacteremia    a. 08/2004 Group A Strep bacteremia.  . Hypertensive heart disease   . S/P kidney transplant    a. due to FSGN, follows with Dr. Vivia BirminghamMattingly-->Transplant in 2003. Was on HD prior to that.  . Type II diabetes mellitus (HCC)     Review of Systems:  Please see each problem below for a pertinent review of systems.   Physical Exam:  Vitals:   06/17/16 1446  BP: (!) 135/58  Pulse: (!) 57  Temp: 97.5 F (36.4 C)  TempSrc: Oral  SpO2: 90%  Weight: 153 lb 5.4 oz (69.6 kg)  Height: 5\' 3"  (1.6 m)   Physical Exam  Constitutional: She is oriented to person, place, and time. No distress.  HENT:  Head: Normocephalic and atraumatic.  Eyes: Conjunctivae are normal. No scleral icterus.  Cardiovascular: Normal rate and regular rhythm.   Pulmonary/Chest: Effort normal. No respiratory distress.  Decreased breath sounds overlying the right lower lung field.  Neurological: She is alert and oriented to person, place, and time.  Skin: Skin is  warm and dry. She is not diaphoretic.     Assessment & Plan:   See Encounters Tab for problem based charting.  Patient discussed with Dr. Josem KaufmannKlima

## 2016-06-17 NOTE — Assessment & Plan Note (Signed)
Assessment She was hospitalized 06/01/16 through 06/05/16 for acute gastritis after consuming candied yam. Right upper quadrant ultrasound was reassuring for no signs of acute cholecystitis. Her symptoms improved with PPI therapy and GI cocktail. Though she used this medication to improve her symptoms, it is known to affect the absorption of her mycophenolate which is important for immunosuppression with a kidney transplant, so she was only given a 5-day course at discharge with a plan then to resume famotidine.  Since discharge, she has not had any recurrence of symptoms as evident by the absence of poor appetite, nausea, vomiting. She is scheduled to follow-up 07/05/16 with [facility].   Plan -Continue famotidine 20 mg twice daily

## 2016-06-17 NOTE — Assessment & Plan Note (Signed)
Assessment After reconciling her medications, it appears she is only on metoprolol 12.5 mg twice daily. Amiodarone 200 mg twice daily was not in her medications bag. She is in normal sinus rhythm right now, and it appears she is to remain on this medication per her last cardiologist office note in August 2017.  Plan -Refill amiodarone 200 mg twice daily. There is a risk of increased mycophenolate levels while taking this medication, but she took this medication at her last hospitalization. I encouraged her to follow-up with her transplant clinic to recheck these levels.

## 2016-06-17 NOTE — Assessment & Plan Note (Signed)
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 She was seen by Dr. Sherene SiresWert [Pulmonology] on 06/11/16 and was treated with another 6 week course of amoxicillin 250 mg 3 times daily as she is immunosuppressed and unclear if her . He also referred to CT surgery for VATS in case her right lower lung does not expand after being atelectatic for so long. She has a follow-up appointment next week to review with her the need for surgical intervention.  Plan -Continue assessing

## 2016-06-17 NOTE — Assessment & Plan Note (Signed)
Assessment I spent 15 minutes in advance care planning today with the patient today as outlined below.  Who would be the best person to help the doctors make decisions?  She feels her children and grandchildren would not want to be a part of this discussion, so she has expressed her wishes to 2 friends. Neither of their contact information is in the system, and she does not have this information on hand.  What matters most to you? Defer to future visit  What experiences have you had with serious illness or death? She has lost her son and daughter.  If you were very sick, what would be most important to you? She would not want to be put on any life support if it were to come to it.  How much flexibility should you afford your decision-maker? Will discuss at future visit".  Do you have anything at home to show others what we talked about to help them honor your wishes? "No but interested in receiving information  Plan -Refer for advance care planning as a part of her annual Wellness visit

## 2016-06-17 NOTE — Progress Notes (Signed)
Case discussed with Dr. Patel at the time of the visit.  We reviewed the resident's history and exam and pertinent patient test results.  I agree with the assessment, diagnosis, and plan of care documented in the resident's note. 

## 2016-06-17 NOTE — Patient Instructions (Signed)
Please start back on amiodarone.   We'll work on scheduling you for advance care planning.

## 2016-06-21 ENCOUNTER — Encounter: Payer: Medicare Other | Admitting: Thoracic Surgery (Cardiothoracic Vascular Surgery)

## 2016-06-21 DIAGNOSIS — Z940 Kidney transplant status: Secondary | ICD-10-CM | POA: Diagnosis not present

## 2016-06-22 ENCOUNTER — Encounter: Payer: Medicare Other | Admitting: Thoracic Surgery (Cardiothoracic Vascular Surgery)

## 2016-06-28 DIAGNOSIS — E212 Other hyperparathyroidism: Secondary | ICD-10-CM | POA: Diagnosis not present

## 2016-06-28 DIAGNOSIS — E663 Overweight: Secondary | ICD-10-CM | POA: Diagnosis not present

## 2016-06-28 DIAGNOSIS — Z940 Kidney transplant status: Secondary | ICD-10-CM | POA: Diagnosis not present

## 2016-06-28 DIAGNOSIS — J90 Pleural effusion, not elsewhere classified: Secondary | ICD-10-CM | POA: Diagnosis not present

## 2016-06-28 DIAGNOSIS — I10 Essential (primary) hypertension: Secondary | ICD-10-CM | POA: Diagnosis not present

## 2016-06-28 DIAGNOSIS — N183 Chronic kidney disease, stage 3 (moderate): Secondary | ICD-10-CM | POA: Diagnosis not present

## 2016-06-29 ENCOUNTER — Ambulatory Visit (INDEPENDENT_AMBULATORY_CARE_PROVIDER_SITE_OTHER): Payer: Medicare Other | Admitting: Internal Medicine

## 2016-06-29 ENCOUNTER — Encounter: Payer: Self-pay | Admitting: Internal Medicine

## 2016-06-29 VITALS — BP 110/66 | HR 50 | Ht 63.0 in | Wt 153.0 lb

## 2016-06-29 DIAGNOSIS — Z9861 Coronary angioplasty status: Secondary | ICD-10-CM

## 2016-06-29 DIAGNOSIS — J90 Pleural effusion, not elsewhere classified: Secondary | ICD-10-CM | POA: Diagnosis not present

## 2016-06-29 DIAGNOSIS — A420 Pulmonary actinomycosis: Secondary | ICD-10-CM

## 2016-06-29 DIAGNOSIS — I2510 Atherosclerotic heart disease of native coronary artery without angina pectoris: Secondary | ICD-10-CM

## 2016-06-29 DIAGNOSIS — J9611 Chronic respiratory failure with hypoxia: Secondary | ICD-10-CM

## 2016-06-29 DIAGNOSIS — I480 Paroxysmal atrial fibrillation: Secondary | ICD-10-CM

## 2016-06-29 NOTE — Progress Notes (Signed)
Subjective:     Patient ID: [name], female   DOB: [birth year],  .   MRN: [id number]    History of Present Illness [age] yobf minimal smoking quit around 1970  with seen for pulmonary consult during hospitalization May 2017 with a pneumococcal pneumonia with associated bacteremia and  R effusion / acute hypoxic respiratory failure. She has stage II chronic kidney disease status post renal transplant, diabetes, chronic diastolic congestive heart failure and atrial fib with RVR, on Eliquis.   History of Present Illness  02/12/2016 Pneumonia/Pleural Effusion Follow Up: Pt. Presents  for follow up for pleural effusion and pneumonia diagnosed  12/18/2015. Patient was admitted May 2017 for pneumococcal pneumonia with associated bacteremia and acute hypoxic respiratory failure. Hospitalization was compensated by atrial filb with RVR and NSTEMI demand ischemia. She was seen for pulmonary consult during this hospitalization. She was found to have a large right exudative pleural effusion-felt to be parapneumonic. She underwent thoracentesis times 2 with 1460 cc and then 800cc . Cytology neg for malignant cells.  Initial CT chest on May 23 showed a large right pleural effusion with right lower lobe collapse Repeat CT chest on May 31 showed complete atelectasis of the right lower lobe with significant atelectasis of the right middle lobe. The proximal right middle lobe bronchus appears occluded. Small to moderate right pleural effusion is decreased from previous CT scan. She was treated with antibiotics and diuretics were adjusted. . 2 D echo showed w/ EF 55%, mod LA dilation, mod TV regurg, PAP 44.  She was recently hospitalized July 4th for atrial fib with RVR. She did not want cardioversion. She has been compliant with her Eliquis. CXR today indicates continued atelectasis or infiltrate. Pt. States she continues to have shortness of  breath with activity.She is using her oxygen at 2L [place] 24/7. She is  interested in weaning off the oxygen, but I told her that since she continues to have dyspnea, we need to continue oxygen therapy until she is better.She states she continues to cough up green secretions with some blood. She states she has been coughing up blood since before her first hospital admission. She denies fever, chest pain, or orthopnea, leg or calf pain or tenderness. rec augmentin x 7 more days    03/12/2016  f/u ov/[name] re: s/p pneumocal pna/ sepsis/ R>>L effusions Chief Complaint  Patient presents with  . Follow-up    CXR today. Pt states she has right lateral rib pain when moving. Pt denies pain on left lateral rib side. Pt c/o fatigue. Pt denies cough, f/c/s and CP/tightness.   Overall improved with breathing and no cough or pleuritic cp. She has a new R flank discomfort that is exquistely positional, better on tylenol, "feels like she pullned a muscle with acute onset 3 d prior to OV  And no assoc n or v or anorexia. Sob or fever or rash or change with breath or cough. rec Please remember to go to the lab   department downstairs for your tests - we will call you with the results when they are available. Stay on 02  But reduce to 1lpm    04/26/2016  f/u ov/[name] re:  Chief Complaint  Patient presents with  . Follow-up    CXR done today. Pt states cough has been worse with large amounts of thick, blood streaked sputum for the past wk.   sob x 50 ft  Sleeps on 2 pillows and no worse than usual Cough worse x  one month  Congested sometimes green esp in am  rec Augmentin 875 mg take one pill twice daily  X 10 days -  You will need need to stop the eliquis x 3 days before the thoracentesis done 04/29/16 x 900 cc transudate with good reexpansion of RLL on f/u cxr      05/06/2016  f/u ov/[name] re:  R pleural effusion  Chief Complaint  Patient presents with  . Follow-up    Breathing improved after thoracentesis 04/30/16, and slowly worsening over the past 2 days. She feels very  tired today.   never had orthopnea, either before or after tap. Green mucus resolved but now sltly bloody am mucus on eliquis  rec Please remember to go to the  x-ray department downstairs for your tests - we will call you with the results when they are available. Least invasive procedure is to adjust your diuretics to pull all the fluid out (add aldactone) and I will let Dr Ivor Messier know why this is needed  Come to outpatient registration at [facility] (behind the ER) at 715 am Wednesday 05/12/16  with nothing to eat or drink after midnight Tuesday .for Bronchoscopy > extrinsic compression only / RLL > grew actino rx since 05/21/16 amox tid x 2 weeks   06/11/2016  f/u ov/[name] re: RLL atx/ actinomyces on bal/ transudative chronic R effusion  Chief Complaint  Patient presents with  . Follow-up    Breathing is unchanged. CXR repeated today.    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 Acute Visit    Pt c/o increased DOE for the past wk, esp worse for the past 2-3 days. She states that she gets SOB "every time I move".  She states that she felt a "pinching feeling" in her chest a few times this am. She also c/o nosebleeds recently.    gradually worse sob/ orthopnea since last ov with minimal peripheral edema on rx per renal and set up for T surgery eval for ? Vats in one week - still comfortable on rest on 02   No obvious day to day or daytime variability or assoc excess/ purulent sputum or mucus  plugs or  chest tightness, subjective wheeze or overt sinus or hb symptoms. No unusual exp hx or h/o childhood pna/ asthma or knowledge of premature birth.  Sleeping ok without nocturnal  or early am exacerbation  of respiratory  c/o's or need for noct saba. Also denies any obvious fluctuation of symptoms with weather or environmental changes or other aggravating or alleviating factors except as outlined above   Current Medications, Allergies, Complete Past Medical History, Past Surgical History, Family History, and Social History were reviewed in [employer] record.  ROS  The following are not active complaints unless bolded sore throat, dysphagia, dental problems, itching, sneezing,  nasal congestion or excess/ purulent secretions, ear ache,   fever, chills, sweats, unintended wt loss,  classically pleuritic or exertional cp,  orthopnea pnd or leg swelling bilaterally sym presyncope, palpitations, abdominal pain, anorexia, nausea, vomiting, diarrhea  or change in bowel or bladder habits, change in stools or urine, dysuria,hematuria,  rash, arthralgias, visual complaints, headache, numbness, weakness or ataxia or problems with walking or coordination,  change in mood/affect or memory.          Significant events/studies:  12/19/2015>>2 D echo showed w/ EF 55%, mod LA dilation, mod TV regurg, PAP 44.  12/23/15>> CT scan on 5/23 showed showed a large right pleural effusion with right lower lobe collapse and patchy airspace disease of the left lower lobe, finding suspicion for aspiration pneumonia, endobronchial lesion causing collapse of the right lower lobe is also in the differential diagnosis    12/24/15>> R Thorocentesis>> 1640 cc clear yellow, most likely exudative by LDH. ( 10 days IV Rocephin) 12/28/15>> Reaccumulation>> R parapneumonic effusion and had another 86580mL's of pleural fluid removed( ABX resumed) 12/31/15>> Repeat CT scan of the chest without contrast on 5/31  showed no convincing right lower lobe mass, small to moderate right pleural effusion. Pulmonary recommended aggressive diuresis to help manage her pleural effusion Patient d/c  another 5 days of Augmentin. 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 ENT: No sinus tenderness, TM clear, pale nasal mucosa, no oral exudate,no post nasal drip, no LAN Cardiac: S1, S2, regular rate and rhythm, no murmur Chest: No wheeze/ rales/ diminished  Base on R   with dullnes ; no accessory muscle use, no nasal flaring, no sternal retractions Abd.: Soft Non-tender Ext: No clubbing cyanosis, - trace  sym pitting ankle edema  Neuro:  normal strength Skin: No rashes, warm and dry Psych: normal mood and behavior     CXR PA and Lateral:   06/11/2016 :    I personally reviewed images and agree with radiology impression as follows:    1. Moderate right pleural effusion, increased in size relative to most recent radiograph from 05/20/2016. Associated right basilar opacity likely reflects atelectasis. 2.  Trace left pleural effusion. 3. Stable cardiomegaly without pulmonary edema        Assessment:

## 2016-06-29 NOTE — Patient Instructions (Signed)
Stop the eliquis for now  Please see patient coordinator before you leave today  to schedule Thoracentesis 07/01/16

## 2016-06-30 NOTE — Assessment & Plan Note (Signed)
Will need to hold eliquis for R thoracentesis  and resume the next day

## 2016-06-30 NOTE — Assessment & Plan Note (Addendum)
12/19/2015>>2 D echo showed w/ EF 55%, mod LA dilation, mod TV regurg, PAP 44.  12/23/15>> CT scan on 5/23 showed showed a large right pleural effusion with right lower lobe collapse and patchy airspace disease of the left lower lobe, finding suspicion for aspiration pneumonia, endobronchial lesion causing collapse of the right lower lobe is also in the differential diagnosis    12/24/15>> R Thorocentesis>> 1640 cc clear yellow, most likely exudative by LDH. ( 10 days IV Rocephin) 12/28/15>> Reaccumulation>> R parapneumonic effusion and had another 82180mL's of pleural fluid removed( ABX resumed) 12/31/15>> Repeat CT scan of the chest without contrast on 5/31 showed no convincing right lower lobe mass, small to moderate right pleural effusion. Pulmonary recommended aggressive diuresis to help manage her pleural effusion Patient d/c  another 5 days of Augmentin. 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 > 05/17/2016 rec tap dry and f/u with CT chest without contrast  - 05/20/2016  R tcentesis x 950 cc then CT chest > done 05/21/2016 and c/w as dz > rec amoxicillin 500 tid x 2 weeks then ov to regroup    Worse symptomatically x sev weeks and clinically larger at this point  > needs to hold eliquis x 1.5 days and repeat R Tap 11/30 if possible and send for repeat studies  Do not feel amox has added anything so ok to d/c when present rx complete  Discussed in detail all the  indications, usual  risks and alternatives  relative to the benefits with patient who agrees to proceed with conservative f/u as outlined    I had an extended discussion with the patient reviewing all relevant studies completed to date and  lasting 15 to 20 minutes of a 25 minute visit    Each maintenance medication was reviewed in detail including most importantly the difference between maintenance and prns and under what circumstances the prns are to be triggered using an action plan format that is not reflected in the computer generated alphabetically organized AVS.    Please see AVS for unique instructions that I personally wrote and verbalized to the the pt in detail and then reviewed with pt  by my nurse highlighting any  changes in therapy recommended at today's visit to their plan of care.

## 2016-06-30 NOTE — Assessment & Plan Note (Signed)
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

## 2016-06-30 NOTE — Assessment & Plan Note (Signed)
rx with 02 at d/c from strep pna May 2017  =As of 03/12/2016 ok on 1lpm 24/7  - 06/11/2016 sats ok RA at rest > rec continue 02 at hs only and prn daytime  Adequate control on present rx, reviewed in detail with pt > no change in rx needed  = 2lpm 24/7 at least until tap

## 2016-07-01 ENCOUNTER — Ambulatory Visit (HOSPITAL_COMMUNITY)
Admission: RE | Admit: 2016-07-01 | Discharge: 2016-07-01 | Disposition: A | Discharge status: Home / Self Care | Payer: Medicare Other | Source: Ambulatory Visit | Attending: Radiology | Admitting: Radiology

## 2016-07-01 ENCOUNTER — Telehealth: Payer: Self-pay

## 2016-07-01 ENCOUNTER — Ambulatory Visit (HOSPITAL_COMMUNITY)
Admission: RE | Admit: 2016-07-01 | Discharge: 2016-07-01 | Disposition: A | Discharge status: Home / Self Care | Payer: Medicare Other | Source: Ambulatory Visit | Attending: Internal Medicine | Admitting: Internal Medicine

## 2016-07-01 DIAGNOSIS — I509 Heart failure, unspecified: Secondary | ICD-10-CM | POA: Insufficient documentation

## 2016-07-01 DIAGNOSIS — J90 Pleural effusion, not elsewhere classified: Secondary | ICD-10-CM | POA: Diagnosis not present

## 2016-07-01 DIAGNOSIS — Z9889 Other specified postprocedural states: Secondary | ICD-10-CM | POA: Diagnosis not present

## 2016-07-01 DIAGNOSIS — R846 Abnormal cytological findings in specimens from respiratory organs and thorax: Secondary | ICD-10-CM | POA: Diagnosis not present

## 2016-07-01 LAB — GLUCOSE, SEROUS FLUID: GLUCOSE FL: 124 mg/dL

## 2016-07-01 LAB — PROTEIN, BODY FLUID

## 2016-07-01 LAB — BODY FLUID CELL COUNT WITH DIFFERENTIAL
EOS FL: 1 %
Lymphs, Fluid: 57 %
Monocyte-Macrophage-Serous Fluid: 33 % — ABNORMAL LOW (ref 50–90)
NEUTROPHIL FLUID: 9 % (ref 0–25)
Total Nucleated Cell Count, Fluid: 236 cu mm (ref 0–1000)

## 2016-07-01 NOTE — Procedures (Signed)
Ultrasound-guided diagnostic and therapeutic right thoracentesis performed yielding 1.2 liters of slightly hazy, yellow fluid. No immediate complications. Follow-up chest x-ray pending. The fluid was sent to the lab for preordered studies.        

## 2016-07-02 LAB — TRIGLYCERIDES, BODY FLUIDS

## 2016-07-02 LAB — LACTATE DEHYDROGENASE, PLEURAL OR PERITONEAL FLUID: LD, Fluid: 55 U/L — ABNORMAL HIGH (ref 3–23)

## 2016-07-04 LAB — CHOLESTEROL, BODY FLUID: Cholesterol, Fluid: 17 mg/dL

## 2016-07-05 DIAGNOSIS — Z7982 Long term (current) use of aspirin: Secondary | ICD-10-CM | POA: Diagnosis not present

## 2016-07-05 DIAGNOSIS — Z8701 Personal history of pneumonia (recurrent): Secondary | ICD-10-CM | POA: Diagnosis not present

## 2016-07-05 DIAGNOSIS — Z4822 Encounter for aftercare following kidney transplant: Secondary | ICD-10-CM | POA: Diagnosis not present

## 2016-07-05 DIAGNOSIS — J189 Pneumonia, unspecified organism: Secondary | ICD-10-CM | POA: Diagnosis not present

## 2016-07-05 DIAGNOSIS — E785 Hyperlipidemia, unspecified: Secondary | ICD-10-CM | POA: Diagnosis not present

## 2016-07-05 DIAGNOSIS — Z882 Allergy status to sulfonamides status: Secondary | ICD-10-CM | POA: Diagnosis not present

## 2016-07-05 DIAGNOSIS — I252 Old myocardial infarction: Secondary | ICD-10-CM | POA: Diagnosis not present

## 2016-07-05 DIAGNOSIS — Z79899 Other long term (current) drug therapy: Secondary | ICD-10-CM | POA: Diagnosis not present

## 2016-07-05 DIAGNOSIS — D899 Disorder involving the immune mechanism, unspecified: Secondary | ICD-10-CM | POA: Diagnosis not present

## 2016-07-05 DIAGNOSIS — I119 Hypertensive heart disease without heart failure: Secondary | ICD-10-CM | POA: Diagnosis not present

## 2016-07-05 DIAGNOSIS — I120 Hypertensive chronic kidney disease with stage 5 chronic kidney disease or end stage renal disease: Secondary | ICD-10-CM | POA: Diagnosis not present

## 2016-07-05 DIAGNOSIS — J948 Other specified pleural conditions: Secondary | ICD-10-CM | POA: Diagnosis not present

## 2016-07-05 DIAGNOSIS — N186 End stage renal disease: Secondary | ICD-10-CM | POA: Diagnosis not present

## 2016-07-05 DIAGNOSIS — Z940 Kidney transplant status: Secondary | ICD-10-CM | POA: Diagnosis not present

## 2016-07-05 DIAGNOSIS — D8989 Other specified disorders involving the immune mechanism, not elsewhere classified: Secondary | ICD-10-CM | POA: Diagnosis not present

## 2016-07-06 ENCOUNTER — Encounter: Payer: Medicare Other | Admitting: Thoracic Surgery (Cardiothoracic Vascular Surgery)

## 2016-07-06 ENCOUNTER — Encounter: Payer: Self-pay | Admitting: Thoracic Surgery (Cardiothoracic Vascular Surgery)

## 2016-07-06 ENCOUNTER — Institutional Professional Consult (permissible substitution) (INDEPENDENT_AMBULATORY_CARE_PROVIDER_SITE_OTHER): Payer: Medicare Other | Admitting: Thoracic Surgery (Cardiothoracic Vascular Surgery)

## 2016-07-06 VITALS — BP 112/59 | HR 55 | Resp 20 | Ht 63.0 in | Wt 153.0 lb

## 2016-07-06 DIAGNOSIS — I2510 Atherosclerotic heart disease of native coronary artery without angina pectoris: Secondary | ICD-10-CM | POA: Diagnosis not present

## 2016-07-06 DIAGNOSIS — J90 Pleural effusion, not elsewhere classified: Secondary | ICD-10-CM

## 2016-07-06 DIAGNOSIS — Z9861 Coronary angioplasty status: Secondary | ICD-10-CM

## 2016-07-06 LAB — MISC LABCORP TEST (SEND OUT)

## 2016-07-06 NOTE — Progress Notes (Signed)
PCP is Gara Kroner, MD Referring Provider is Nyoka Cowden, MD  Chief Complaint  Patient presents with  . Pleural Effusion    Surgical eval, Chest CT 05/21/16, s/p  right thoracentesis x4     HPI: 82 year old woman sent for consultation regarding recurrent right pleural effusion.  [name] is an 82 year old woman with a past medical history significant for kidney transplant, stage II chronic kidney disease, type 2 diabetes, hypertension, coronary artery disease, chronic diastolic congestive heart failure, atrial fibrillation, gout, and pneumonia.   She was hospitalized in May 2017 with pneumococcal pneumonia, complicated by bacteremia and acute hypoxic respiratory failure. She had a large right pleural effusion. She had thoracentesis twice draining 1.5 L and then 800 mL. Cytology was negative for malignancy. She had a thoracentesis in June draining a liter of fluid. In July she was hospitalized with atrial fibrillation with a rapid ventricular response. She has had multiple additional thoracenteses since then each time draining about a liter. Her most recent thoracentesis was on November 10. She says that each time her breathing improves for a few days or up to a week at most before she starts getting short of breath again.  Past Medical History:  Diagnosis Date  . Candidal esophagitis (HCC)    a. 03/2014.  Marland Kitchen Cholelithiasis   . Chronic diastolic CHF (congestive heart failure) (HCC)    a. 06/2011 Echo: EF 60-65%, no rwma, Gr1 DD, mild TR. // b. Echo 12/19/15: Severe LVH, EF 55-60%, normal wall motion, mild MR, moderate LAE, moderate TR, PASP 44 mmHg  . CKD (chronic kidney disease), stage III    a. in setting of prior ESRD and cadaveric tx in 2003.  Marland Kitchen Coronary artery disease    a. 01/2001 Cath/PCI: LAD 50p, 53m, D1 50-60, RCA 87m (3.0x13 BX Velocity BMS).// b. NSTEMI 5/17 (demand ischemia) in setting of AF with RVR, pneumonia, CKD, a/c HF >> Myoview 12/26/15: prob inf infarct, no ischemia,  EF 52%, Low Risk >> med Rx  . Gastritis    a. 03/2014  . Gout    unconfirmed by joint aspiration  . History of bacteremia    a. 08/2004 Group A Strep bacteremia.  . Hypertensive heart disease   . S/P kidney transplant    a. due to FSGN, follows with Dr. Vivia Birmingham in 2003. Was on HD prior to that.  . Type II diabetes mellitus (HCC)     Past Surgical History:  Procedure Laterality Date  . ABDOMINAL HYSTERECTOMY    . ESOPHAGOGASTRODUODENOSCOPY N/A 03/20/2014   Procedure: ESOPHAGOGASTRODUODENOSCOPY (EGD);  Surgeon: Theda Belfast, MD;  Location: [facility] ENDOSCOPY;  Service: Endoscopy;  Laterality: N/A;  . KIDNEY TRANSPLANT  2003  . VIDEO BRONCHOSCOPY Bilateral 05/12/2016   Procedure: VIDEO BRONCHOSCOPY WITHOUT FLUORO;  Surgeon: Nyoka Cowden, MD;  Location: [facility] ENDOSCOPY;  Service: Cardiopulmonary;  Laterality: Bilateral;    Family History  Problem Relation Age of Onset  . CAD Father   . CAD Brother   . CAD      Both parents and multiple siblings have died from coronary disease prior to age 59 and several in their 12's and 52's.    Social History Social History  Substance Use Topics  . Smoking status: Former Smoker    Quit date: 04/19/1969  . Smokeless tobacco: Never Used  . Alcohol use No    Current Outpatient Prescriptions  Medication Sig Dispense Refill  . allopurinol (ZYLOPRIM) 100 MG tablet Take 100 mg by mouth daily.     Marland Kitchen  amiodarone (PACERONE) 200 MG tablet Take 1 tablet (200 mg total) by mouth 2 (two) times daily. 60 tablet 12  . amLODipine (NORVASC) 10 MG tablet Take 5 mg by mouth daily.    Marland Kitchen amoxicillin (AMOXIL) 500 MG capsule Take 1 capsule (500 mg total) by mouth 3 (three) times daily. 120 capsule 1  . apixaban (ELIQUIS) 2.5 MG TABS tablet Take 1 tablet (2.5 mg total) by mouth 2 (two) times daily. Cancel previous Eliquis prescription 60 tablet 3  . aspirin EC 81 MG tablet Take 81 mg by mouth daily.    . cinacalcet (SENSIPAR) 90 MG tablet Take 22.5 mg by  mouth See admin instructions. Once every one to two weeks    . famotidine (PEPCID) 20 MG tablet Take 20 mg by mouth 2 (two) times daily.    . feeding supplement, ENSURE ENLIVE, (ENSURE ENLIVE) LIQD Take 237 mLs by mouth 2 (two) times daily between meals. 237 mL 12  . furosemide (LASIX) 80 MG tablet Take 80 mg by mouth 2 (two) times daily.    . metoprolol tartrate (LOPRESSOR) 25 MG tablet Take 0.5 tablets (12.5 mg total) by mouth 2 (two) times daily. 30 tablet 0  . mycophenolate (CELLCEPT) 250 MG capsule Take 500 mg by mouth 2 (two) times daily.    . OXYGEN Inhale into the lungs. 2lpm 24/7    . simvastatin (ZOCOR) 20 MG tablet Take 20 mg by mouth every evening.    . tacrolimus (PROGRAF) 0.5 MG capsule 1 mg in the morning and 1 mg at bedtime (Patient taking differently: Take 0.5 mg by mouth 2 (two) times daily. (To be taken in conjunction with one 1 mg capsule to equal a total of 1.5 milligrams/each dose)) 120 capsule 0  . tacrolimus (PROGRAF) 1 MG capsule Take 1 mg by mouth 2 (two) times daily. (To be taken in conjunction with one 0.5 mg capsule to equal a total of 1.5 milligrams/each dose)     No current facility-administered medications for this visit.     Allergies  Allergen Reactions  . Sulfa Drugs Cross Reactors Swelling    Review of Systems  Constitutional: Positive for appetite change. Negative for chills and fever.  HENT: Positive for dental problem. Negative for trouble swallowing.   Respiratory: Positive for cough and shortness of breath.        Home oxygen  Cardiovascular: Negative for chest pain.  Gastrointestinal: Negative for abdominal pain and blood in stool.  Genitourinary: Negative for difficulty urinating and dysuria.       Renal transplant  Hematological: Negative for adenopathy. Bruises/bleeds easily (On Eliquis).  All other systems reviewed and are negative.   BP (!) 112/59   Pulse (!) 55   Resp 20   Ht 5\' 3"  (1.6 m)   Wt 153 lb (69.4 kg)   SpO2 95% Comment:  RA  BMI 27.10 kg/m  Physical Exam  Constitutional: She is oriented to person, place, and time. She appears well-developed and well-nourished. No distress.  82 year old woman in wheelchair  HENT:  Head: Normocephalic and atraumatic.  Mouth/Throat: No oropharyngeal exudate.  Eyes: Conjunctivae and EOM are normal. No scleral icterus.  Neck: No thyromegaly present.  Cardiovascular: Normal rate and regular rhythm.  Exam reveals gallop.   No murmur heard. Pulmonary/Chest: Effort normal. She has no wheezes. She has no rales.  Diminished breath sounds both bases  Abdominal: Soft. She exhibits no distension. There is no tenderness.  Musculoskeletal: She exhibits edema (1+).  Lymphadenopathy:  She has no cervical adenopathy.  Neurological: She is alert and oriented to person, place, and time.  Skin: Skin is warm and dry.  Vitals reviewed.    Diagnostic Tests: EXAM: CHEST 1 VIEW  COMPARISON:  06/11/2016  FINDINGS: Small bilateral pleural effusion. There is bilateral basilar atelectasis or infiltrate. No pulmonary edema. No pneumothorax.  IMPRESSION: Small bilateral pleural effusion. No pneumothorax. Bilateral basilar atelectasis or infiltrate.   Electronically Signed   By: [name]  Pop [name]   On: 07/01/2016 12:46 CT CHEST WITHOUT CONTRAST  TECHNIQUE: Multidetector CT imaging of the chest was performed following the standard protocol without IV contrast. Images were obtained in both the prone and supine positions.  COMPARISON:  12/31/2015  FINDINGS: Cardiovascular: Stable moderate cardiomegaly. No evidence of pericardial effusion. Three-vessel coronary artery calcification. Aortic atherosclerosis.  Mediastinum/Nodes: No masses or pathologically enlarged lymph nodes identified on this unenhanced exam.  Lungs/Pleura: Small right pleural effusion and right lower lobe atelectasis have decreased since previous study. Mild scarring in the inferior lingula  remains stable.  Patchy areas of asymmetric airspace disease are seen in the right upper, middle and lower lobes, which are new since previous study, and may be due to infectious or inflammatory etiologies or possibly asymmetric edema.  Upper Abdomen: Normal adrenal glands. Diffuse atrophy of the visualized upper portions of the native kidneys is seen, consistent with history of chronic renal failure. A 3.2 cm lesion is seen in the upper pole of the left kidney which measures higher than fluid attenuation (27 Hounsfield units), and could represent a proteinaceous cyst or solid mass. This has mildly increased in size from 2.6 cm on earlier exam of 12/23/2015.  Musculoskeletal: No suspicious bone lesions or other significant abnormality.  IMPRESSION: Small right pleural effusion and right basilar atelectasis, which have decreased since previous study.  New multilobar asymmetric airspace disease in the right upper, middle, and lower lobes. Differential diagnosis includes infectious and inflammatory etiologies as well as asymmetric edema.  Moderate cardiomegaly, coronary artery calcification, and aortic atherosclerosis.  Diffuse atrophy of native kidneys, with 3.2 cm indeterminate lesion in upper pole of the left kidney. This could represent a complex cyst or solid renal mass. Renal ultrasound is recommended for further characterization.   Electronically Signed   By: Myles RosenthalJohn  Stahl [name]   On: 05/21/2016 16:18  I personally reviewed her chest x-rays and CT chest. I concur with the findings noted above.  Impression: 82 year old woman with a recurrent right pleural effusion that has required multiple thoracenteses. This first became a problem in the setting of a pneumococcal pneumonia with a parapneumonic effusion. It was exudative initially but has become slightly more transudate-like over time.  I think the best option for [name] would be to place a right pleural  catheter. This would avoid the need for repeated thoracenteses and going on and off of her blood thinner repeatedly. By keeping the fluid drained we are likely to achieve pleural symphysis and also might be able to get the catheter out. This is a percutaneous procedure to be done under local anesthesia.  I do not think a VATS is necessary or advisable in her case. It would be a bigger procedure involving general anesthesia, higher risk, and more pain. It does not have significantly better results.  I had a long discussion with [name] and her daughter. I shows normal pleural catheter and gave him some literature and information on that. She is very much against having a catheter placed. If she were  to change her mind I will be happy to arrange for pleural catheter placement  Plan: Patient will call if she decides to proceed with pleural catheter placement  Loreli SlotSteven C Marchell Froman, MD [facility] [phone]

## 2016-07-13 NOTE — Progress Notes (Signed)
Patient states she is unable to afford Eliquis (apixaban) samples. Scheduled clinic appointment to give samples.  Patient states she is confused about what medications she takes. She will bring all medications to her appointment next Friday.

## 2016-07-16 ENCOUNTER — Telehealth: Payer: Self-pay | Admitting: Internal Medicine

## 2016-07-16 NOTE — Telephone Encounter (Signed)
[facility]  I advised I do not have any form on the pt to be signed  She will refax it  I faxed her last ov note to [phone] per her request Nothing further needed

## 2016-07-18 ENCOUNTER — Emergency Department (HOSPITAL_COMMUNITY): Payer: Medicare Other

## 2016-07-18 ENCOUNTER — Inpatient Hospital Stay (HOSPITAL_COMMUNITY)
Admission: EM | Admit: 2016-07-18 | Discharge: 2016-07-21 | DRG: 187 | Disposition: A | Discharge status: Home Health | Payer: Medicare Other | Attending: Internal Medicine | Admitting: Internal Medicine

## 2016-07-18 ENCOUNTER — Encounter (HOSPITAL_COMMUNITY): Payer: Self-pay

## 2016-07-18 DIAGNOSIS — M80031A Age-related osteoporosis with current pathological fracture, right forearm, initial encounter for fracture: Secondary | ICD-10-CM | POA: Diagnosis not present

## 2016-07-18 DIAGNOSIS — S62114A Nondisplaced fracture of triquetrum [cuneiform] bone, right wrist, initial encounter for closed fracture: Secondary | ICD-10-CM

## 2016-07-18 DIAGNOSIS — J449 Chronic obstructive pulmonary disease, unspecified: Secondary | ICD-10-CM

## 2016-07-18 DIAGNOSIS — Z940 Kidney transplant status: Secondary | ICD-10-CM

## 2016-07-18 DIAGNOSIS — S3992XA Unspecified injury of lower back, initial encounter: Secondary | ICD-10-CM | POA: Diagnosis not present

## 2016-07-18 DIAGNOSIS — J9611 Chronic respiratory failure with hypoxia: Secondary | ICD-10-CM | POA: Diagnosis not present

## 2016-07-18 DIAGNOSIS — I5032 Chronic diastolic (congestive) heart failure: Secondary | ICD-10-CM | POA: Diagnosis not present

## 2016-07-18 DIAGNOSIS — R0600 Dyspnea, unspecified: Secondary | ICD-10-CM

## 2016-07-18 DIAGNOSIS — E1122 Type 2 diabetes mellitus with diabetic chronic kidney disease: Secondary | ICD-10-CM | POA: Diagnosis present

## 2016-07-18 DIAGNOSIS — Z79899 Other long term (current) drug therapy: Secondary | ICD-10-CM

## 2016-07-18 DIAGNOSIS — M533 Sacrococcygeal disorders, not elsewhere classified: Secondary | ICD-10-CM | POA: Diagnosis not present

## 2016-07-18 DIAGNOSIS — R7989 Other specified abnormal findings of blood chemistry: Secondary | ICD-10-CM | POA: Diagnosis not present

## 2016-07-18 DIAGNOSIS — Z8249 Family history of ischemic heart disease and other diseases of the circulatory system: Secondary | ICD-10-CM

## 2016-07-18 DIAGNOSIS — M79641 Pain in right hand: Secondary | ICD-10-CM | POA: Diagnosis not present

## 2016-07-18 DIAGNOSIS — R0602 Shortness of breath: Secondary | ICD-10-CM | POA: Diagnosis not present

## 2016-07-18 DIAGNOSIS — I480 Paroxysmal atrial fibrillation: Secondary | ICD-10-CM

## 2016-07-18 DIAGNOSIS — S62101A Fracture of unspecified carpal bone, right wrist, initial encounter for closed fracture: Secondary | ICD-10-CM

## 2016-07-18 DIAGNOSIS — J90 Pleural effusion, not elsewhere classified: Secondary | ICD-10-CM | POA: Diagnosis not present

## 2016-07-18 DIAGNOSIS — Z9981 Dependence on supplemental oxygen: Secondary | ICD-10-CM

## 2016-07-18 DIAGNOSIS — N183 Chronic kidney disease, stage 3 (moderate): Secondary | ICD-10-CM | POA: Diagnosis present

## 2016-07-18 DIAGNOSIS — I440 Atrioventricular block, first degree: Secondary | ICD-10-CM | POA: Diagnosis present

## 2016-07-18 DIAGNOSIS — I2510 Atherosclerotic heart disease of native coronary artery without angina pectoris: Secondary | ICD-10-CM

## 2016-07-18 DIAGNOSIS — Z7982 Long term (current) use of aspirin: Secondary | ICD-10-CM

## 2016-07-18 DIAGNOSIS — Z87891 Personal history of nicotine dependence: Secondary | ICD-10-CM

## 2016-07-18 DIAGNOSIS — R778 Other specified abnormalities of plasma proteins: Secondary | ICD-10-CM | POA: Diagnosis present

## 2016-07-18 DIAGNOSIS — I10 Essential (primary) hypertension: Secondary | ICD-10-CM | POA: Diagnosis present

## 2016-07-18 DIAGNOSIS — N2581 Secondary hyperparathyroidism of renal origin: Secondary | ICD-10-CM | POA: Diagnosis present

## 2016-07-18 DIAGNOSIS — W19XXXA Unspecified fall, initial encounter: Secondary | ICD-10-CM

## 2016-07-18 DIAGNOSIS — Z7901 Long term (current) use of anticoagulants: Secondary | ICD-10-CM

## 2016-07-18 DIAGNOSIS — S6991XA Unspecified injury of right wrist, hand and finger(s), initial encounter: Secondary | ICD-10-CM | POA: Diagnosis not present

## 2016-07-18 DIAGNOSIS — S0990XA Unspecified injury of head, initial encounter: Secondary | ICD-10-CM | POA: Diagnosis not present

## 2016-07-18 DIAGNOSIS — I1310 Hypertensive heart and chronic kidney disease without heart failure, with stage 1 through stage 4 chronic kidney disease, or unspecified chronic kidney disease: Secondary | ICD-10-CM | POA: Diagnosis present

## 2016-07-18 DIAGNOSIS — E559 Vitamin D deficiency, unspecified: Secondary | ICD-10-CM | POA: Diagnosis present

## 2016-07-18 DIAGNOSIS — Z955 Presence of coronary angioplasty implant and graft: Secondary | ICD-10-CM

## 2016-07-18 DIAGNOSIS — Z66 Do not resuscitate: Secondary | ICD-10-CM | POA: Diagnosis present

## 2016-07-18 DIAGNOSIS — Z9689 Presence of other specified functional implants: Secondary | ICD-10-CM

## 2016-07-18 DIAGNOSIS — Z9861 Coronary angioplasty status: Secondary | ICD-10-CM

## 2016-07-18 DIAGNOSIS — Y92002 Bathroom of unspecified non-institutional (private) residence single-family (private) house as the place of occurrence of the external cause: Secondary | ICD-10-CM

## 2016-07-18 DIAGNOSIS — S199XXA Unspecified injury of neck, initial encounter: Secondary | ICD-10-CM | POA: Diagnosis not present

## 2016-07-18 DIAGNOSIS — W1839XA Other fall on same level, initial encounter: Secondary | ICD-10-CM | POA: Diagnosis present

## 2016-07-18 DIAGNOSIS — T148XXA Other injury of unspecified body region, initial encounter: Secondary | ICD-10-CM | POA: Diagnosis not present

## 2016-07-18 LAB — BASIC METABOLIC PANEL
ANION GAP: 8 (ref 5–15)
BUN: 20 mg/dL (ref 6–20)
CO2: 27 mmol/L (ref 22–32)
Calcium: 10.1 mg/dL (ref 8.9–10.3)
Chloride: 105 mmol/L (ref 101–111)
Creatinine, Ser: 1.73 mg/dL — ABNORMAL HIGH (ref 0.44–1.00)
GFR calc Af Amer: 30 mL/min — ABNORMAL LOW (ref >60)
GFR, EST NON AFRICAN AMERICAN: 26 mL/min — AB (ref >60)
GLUCOSE: 110 mg/dL — AB (ref 65–99)
POTASSIUM: 4.1 mmol/L (ref 3.5–5.1)
Sodium: 140 mmol/L (ref 135–145)

## 2016-07-18 LAB — CBC WITH DIFFERENTIAL/PLATELET
BASOS ABS: 0 10*3/uL (ref 0.0–0.1)
Basophils Relative: 0 %
Eosinophils Absolute: 0.1 10*3/uL (ref 0.0–0.7)
Eosinophils Relative: 1 %
HEMATOCRIT: 38 % (ref 36.0–46.0)
Hemoglobin: 12.3 g/dL (ref 12.0–15.0)
LYMPHS PCT: 18 %
Lymphs Abs: 1.2 10*3/uL (ref 0.7–4.0)
MCH: 29.9 pg (ref 26.0–34.0)
MCHC: 32.4 g/dL (ref 30.0–36.0)
MCV: 92.2 fL (ref 78.0–100.0)
MONO ABS: 0.8 10*3/uL (ref 0.1–1.0)
Monocytes Relative: 12 %
NEUTROS ABS: 4.4 10*3/uL (ref 1.7–7.7)
Neutrophils Relative %: 69 %
Platelets: 223 10*3/uL (ref 150–400)
RBC: 4.12 MIL/uL (ref 3.87–5.11)
RDW: 14.9 % (ref 11.5–15.5)
WBC: 6.5 10*3/uL (ref 4.0–10.5)

## 2016-07-18 LAB — I-STAT TROPONIN, ED: Troponin i, poc: 0.14 ng/mL (ref 0.00–0.08)

## 2016-07-18 LAB — TROPONIN I
TROPONIN I: 0.11 ng/mL — AB (ref <0.03)
TROPONIN I: 0.12 ng/mL — AB (ref <0.03)

## 2016-07-18 MED ORDER — ALLOPURINOL 100 MG PO TABS
100.0000 mg | ORAL_TABLET | Freq: Every day | ORAL | Status: DC
  Administered 2016-07-18 – 2016-07-21 (×4): 100 mg via ORAL
  Filled 2016-07-18 (×4): qty 1

## 2016-07-18 MED ORDER — CINACALCET HCL 90 MG PO TABS
22.5000 mg | ORAL_TABLET | ORAL | Status: DC
  Administered 2016-07-18: 45 mg via ORAL

## 2016-07-18 MED ORDER — TACROLIMUS 1 MG PO CAPS
2.5000 mg | ORAL_CAPSULE | Freq: Every day | ORAL | Status: DC
  Administered 2016-07-19 – 2016-07-21 (×2): 2.5 mg via ORAL
  Filled 2016-07-18 (×3): qty 1

## 2016-07-18 MED ORDER — MYCOPHENOLATE MOFETIL 250 MG PO CAPS
500.0000 mg | ORAL_CAPSULE | Freq: Two times a day (BID) | ORAL | Status: DC
  Administered 2016-07-18 – 2016-07-21 (×6): 500 mg via ORAL
  Filled 2016-07-18 (×8): qty 2

## 2016-07-18 MED ORDER — AMOXICILLIN 500 MG PO CAPS
500.0000 mg | ORAL_CAPSULE | Freq: Three times a day (TID) | ORAL | Status: DC
  Administered 2016-07-18 – 2016-07-21 (×8): 500 mg via ORAL
  Filled 2016-07-18 (×8): qty 1

## 2016-07-18 MED ORDER — AMIODARONE HCL 100 MG PO TABS
200.0000 mg | ORAL_TABLET | Freq: Two times a day (BID) | ORAL | Status: DC
  Administered 2016-07-18 – 2016-07-21 (×4): 200 mg via ORAL
  Filled 2016-07-18 (×5): qty 2

## 2016-07-18 MED ORDER — ACETAMINOPHEN 325 MG PO TABS
650.0000 mg | ORAL_TABLET | Freq: Four times a day (QID) | ORAL | Status: DC | PRN
  Administered 2016-07-19 (×2): 650 mg via ORAL
  Filled 2016-07-18 (×2): qty 2

## 2016-07-18 MED ORDER — ENSURE ENLIVE PO LIQD
237.0000 mL | Freq: Two times a day (BID) | ORAL | Status: DC
  Administered 2016-07-19 – 2016-07-21 (×4): 237 mL via ORAL

## 2016-07-18 MED ORDER — APIXABAN 2.5 MG PO TABS
2.5000 mg | ORAL_TABLET | Freq: Two times a day (BID) | ORAL | Status: DC
  Administered 2016-07-18 – 2016-07-19 (×2): 2.5 mg via ORAL
  Filled 2016-07-18 (×2): qty 1

## 2016-07-18 MED ORDER — SIMVASTATIN 20 MG PO TABS
20.0000 mg | ORAL_TABLET | Freq: Every evening | ORAL | Status: DC
  Administered 2016-07-18 – 2016-07-21 (×4): 20 mg via ORAL
  Filled 2016-07-18 (×4): qty 1

## 2016-07-18 MED ORDER — ASPIRIN EC 81 MG PO TBEC
81.0000 mg | DELAYED_RELEASE_TABLET | Freq: Every day | ORAL | Status: DC
  Administered 2016-07-18 – 2016-07-21 (×4): 81 mg via ORAL
  Filled 2016-07-18 (×4): qty 1

## 2016-07-18 MED ORDER — TACROLIMUS 1 MG PO CAPS
2.0000 mg | ORAL_CAPSULE | ORAL | Status: DC
  Administered 2016-07-18: 2 mg via ORAL

## 2016-07-18 MED ORDER — TRAMADOL HCL 50 MG PO TABS
50.0000 mg | ORAL_TABLET | Freq: Four times a day (QID) | ORAL | Status: DC | PRN
  Administered 2016-07-18 – 2016-07-19 (×2): 50 mg via ORAL
  Filled 2016-07-18 (×2): qty 1

## 2016-07-18 MED ORDER — TACROLIMUS 1 MG PO CAPS
2.0000 mg | ORAL_CAPSULE | Freq: Every day | ORAL | Status: DC
  Administered 2016-07-18 – 2016-07-20 (×3): 2 mg via ORAL
  Filled 2016-07-18 (×4): qty 2

## 2016-07-18 MED ORDER — ACETAMINOPHEN 325 MG PO TABS
650.0000 mg | ORAL_TABLET | Freq: Once | ORAL | Status: AC
  Administered 2016-07-18: 650 mg via ORAL
  Filled 2016-07-18: qty 2

## 2016-07-18 MED ORDER — FAMOTIDINE 20 MG PO TABS
20.0000 mg | ORAL_TABLET | Freq: Two times a day (BID) | ORAL | Status: DC
  Administered 2016-07-18 – 2016-07-20 (×4): 20 mg via ORAL
  Filled 2016-07-18 (×3): qty 1

## 2016-07-18 MED ORDER — METOPROLOL TARTRATE 12.5 MG HALF TABLET
12.5000 mg | ORAL_TABLET | Freq: Two times a day (BID) | ORAL | Status: DC
  Administered 2016-07-18 – 2016-07-19 (×2): 12.5 mg via ORAL
  Filled 2016-07-18 (×2): qty 1

## 2016-07-18 MED ORDER — SODIUM CHLORIDE 0.9 % IV BOLUS (SEPSIS)
500.0000 mL | Freq: Once | INTRAVENOUS | Status: AC
  Administered 2016-07-18: 500 mL via INTRAVENOUS

## 2016-07-18 NOTE — ED Provider Notes (Signed)
[facility]ENCY DEPT Provider Note   CSN: [id number] Arrival date & time: 07/18/16  1321     History   Chief Complaint Chief Complaint  Patient presents with  . Fall    HPI [name] is a 82 y.o. female who presents with a fall. PMH significant for COPD on 2L O2 chronically, CAD s/p stenting to RCA in 2002, A.fib on Eliquis, diastolic CHF EF 60-65% via Echo in May 2017, CKD stage 3 s/p renal transplant on immunosuppresants, HTN, non-insulin dependent Type 2 DM. Patient lives alone at home and ambulates with a walker. She states that she was walking to the bathroom today at about 12PM when she suddenly "started whirling". She denies mechanical fall, lightheadedness, or syncope. She fell backwards in to a wall and slid down the wall. She was unable to get herself up off the floor so she scooted around the house and rolled. Her neighbor came over to check on her when she didn't answer her phone. They helped her up off the floor and called EMS. Patient states she is having pain in the right wrist and in the "tailbone". Also reports a slight posterior headache from where she bumped her head on the wall. She denies fever, chills, recent illness, chest pain, worsening of her chronic dyspnea, cough, abdominal pain, N/V, dysuria.  She last saw her cardiologist Dr. Rennis Golden on 8/30. She had a nuclear stress test in May which was low risk and negative for ischemia although she had an elevated troponin at that time. It was thought that it was elevated troponin was due to demand ischemia. Dr. Rennis Golden did not recommend cath due to her kidney disease. Cath in 2002 showed residual, moderate, non-obstructive LAD and Diag disease which was medically managed.  Per kidney specialist, baseline SCr is 1.4-1.8.  HPI  Past Medical History:  Diagnosis Date  . Candidal esophagitis (HCC)    a. 03/2014.  Marland Kitchen Cholelithiasis   . Chronic diastolic CHF (congestive heart failure) (HCC)    a. 06/2011 Echo: EF 60-65%, no  rwma, Gr1 DD, mild TR. // b. Echo 12/19/15: Severe LVH, EF 55-60%, normal wall motion, mild MR, moderate LAE, moderate TR, PASP 44 mmHg  . CKD (chronic kidney disease), stage III    a. in setting of prior ESRD and cadaveric tx in 2003.  Marland Kitchen Coronary artery disease    a. 01/2001 Cath/PCI: LAD 50p, 63m, D1 50-60, RCA 40m (3.0x13 BX Velocity BMS).// b. NSTEMI 5/17 (demand ischemia) in setting of AF with RVR, pneumonia, CKD, a/c HF >> Myoview 12/26/15: prob inf infarct, no ischemia, EF 52%, Low Risk >> med Rx  . Gastritis    a. 03/2014  . Gout    unconfirmed by joint aspiration  . History of bacteremia    a. 08/2004 Group A Strep bacteremia.  . Hypertensive heart disease   . S/P kidney transplant    a. due to FSGN, follows with Dr. Vivia Birmingham in 2003. Was on HD prior to that.  . Type II diabetes mellitus [employer])     Patient Active Problem List   Diagnosis Date Noted  . Advance care planning 06/17/2016  . Actinomycosis, pulmonary (HCC) 06/13/2016  . Chronic diastolic CHF (congestive heart failure) (HCC)   . Hemoptysis 05/12/2016  . Chronic respiratory failure with hypoxia (HCC) 03/13/2016  . Chronic diastolic (congestive) heart failure   . PAF (paroxysmal atrial fibrillation) (HCC) 12/24/2015  . CKD (chronic kidney disease) stage 3, GFR 30-59 ml/min   . History of gastritis  03/20/2014  . Acute respiratory failure with hypoxia (HCC) 07/08/2013  . Pleural effusion, bilateral 07/07/2013  . Gout 05/03/2012  . Preventative health care 05/03/2012  . CAD S/P remote RCA PCI 04/19/2012  . Hypertension 04/19/2012  . H/O secondary hyperparathyroidism 04/19/2012  . S/P kidney transplant     Past Surgical History:  Procedure Laterality Date  . ABDOMINAL HYSTERECTOMY    . ESOPHAGOGASTRODUODENOSCOPY N/A 03/20/2014   Procedure: ESOPHAGOGASTRODUODENOSCOPY (EGD);  Surgeon: Theda BelfastPatrick D Hung, MD;  [facility];  Service: Endoscopy;  Laterality: N/A;  . KIDNEY TRANSPLANT  2003  . VIDEO  BRONCHOSCOPY Bilateral 05/12/2016   Procedure: VIDEO BRONCHOSCOPY WITHOUT FLUORO;  Surgeon: Nyoka CowdenMichael B Wert, MD;  Location: [facility] ENDOSCOPY;  Service: Cardiopulmonary;  Laterality: Bilateral;    OB History    No data available       Home Medications    Prior to Admission medications   Medication Sig Start Date End Date Taking? Authorizing Provider  allopurinol (ZYLOPRIM) 100 MG tablet Take 100 mg by mouth daily.     Historical Provider, MD  amiodarone (PACERONE) 200 MG tablet Take 1 tablet (200 mg total) by mouth 2 (two) times daily. 06/17/16   Beather Arbourushil V Patel, MD  amLODipine (NORVASC) 10 MG tablet Take 5 mg by mouth daily. 05/21/16   Historical Provider, MD  amoxicillin (AMOXIL) 500 MG capsule Take 1 capsule (500 mg total) by mouth 3 (three) times daily. 06/11/16   Nyoka CowdenMichael B Wert, MD  apixaban (ELIQUIS) 2.5 MG TABS tablet Take 1 tablet (2.5 mg total) by mouth 2 (two) times daily. Cancel previous Eliquis prescription 05/21/16   Denton Brickiana M Truong, MD  aspirin EC 81 MG tablet Take 81 mg by mouth daily.    Historical Provider, MD  cinacalcet (SENSIPAR) 90 MG tablet Take 22.5 mg by mouth See admin instructions. Once every one to two weeks    Historical Provider, MD  famotidine (PEPCID) 20 MG tablet Take 20 mg by mouth 2 (two) times daily.    Historical Provider, MD  feeding supplement, ENSURE ENLIVE, (ENSURE ENLIVE) LIQD Take 237 mLs by mouth 2 (two) times daily between meals. 06/04/16   Nyra MarketGorica Svalina, MD  furosemide (LASIX) 80 MG tablet Take 80 mg by mouth 2 (two) times daily.    Historical Provider, MD  metoprolol tartrate (LOPRESSOR) 25 MG tablet Take 0.5 tablets (12.5 mg total) by mouth 2 (two) times daily. 06/04/16   Deneise LeverParth Saraiya, MD  mycophenolate (CELLCEPT) 250 MG capsule Take 500 mg by mouth 2 (two) times daily.    Historical Provider, MD  OXYGEN Inhale into the lungs. 2lpm 24/7    Historical Provider, MD  simvastatin (ZOCOR) 20 MG tablet Take 20 mg by mouth every evening.    Historical  Provider, MD  tacrolimus (PROGRAF) 0.5 MG capsule 1 mg in the morning and 1 mg at bedtime Patient taking differently: Take 0.5 mg by mouth 2 (two) times daily. (To be taken in conjunction with one 1 mg capsule to equal a total of 1.5 milligrams/each dose) 01/01/16   Richarda OverlieNayana Abrol, MD  tacrolimus (PROGRAF) 1 MG capsule Take 1 mg by mouth 2 (two) times daily. (To be taken in conjunction with one 0.5 mg capsule to equal a total of 1.5 milligrams/each dose)    Historical Provider, MD    Family History Family History  Problem Relation Age of Onset  . CAD Father   . CAD Brother   . CAD      Both parents and multiple siblings  have died from coronary disease prior to age 80 and several in their 340's and 3450's.    Social History Social History  Substance Use Topics  . Smoking status: Former Smoker    Quit date: 04/19/1969  . Smokeless tobacco: Never Used  . Alcohol use No     Allergies   Sulfa drugs cross reactors   Review of Systems Review of Systems  Constitutional: Negative for chills and fever.  HENT: Negative for congestion and sore throat.   Eyes: Negative for visual disturbance.  Respiratory: Positive for shortness of breath (chronic). Negative for cough.   Cardiovascular: Negative for chest pain.  Gastrointestinal: Negative for abdominal pain, nausea and vomiting.  Genitourinary: Negative for dysuria.  Musculoskeletal: Positive for arthralgias, back pain and gait problem. Negative for joint swelling, myalgias and neck pain.  Skin: Negative for wound.  Allergic/Immunologic: Positive for immunocompromised state (Takes Prograf and Cellcept).  Neurological: Positive for dizziness and headaches. Negative for syncope, weakness and light-headedness.  All other systems reviewed and are negative.   Physical Exam Updated Vital Signs BP 128/71   Pulse (!) 59   Resp 13   Ht 5\' 3"  (1.6 m)   Wt 68.9 kg   SpO2 99%   BMI 26.93 kg/m   Physical Exam  Constitutional: She is oriented  to person, place, and time. She appears well-developed and well-nourished. No distress.  Elderly female in NAD, lying on her side. On 2 L O2 via [place]  HENT:  Head: Normocephalic and atraumatic.  No hematoma or scalp laceration  Eyes: Conjunctivae are normal. Pupils are equal, round, and reactive to light. Right eye exhibits no discharge. Left eye exhibits no discharge. No scleral icterus.  Neck: Normal range of motion.  Cardiovascular: Normal rate and regular rhythm.  Exam reveals no gallop and no friction rub.   No murmur heard. Pulmonary/Chest: Effort normal and breath sounds normal. No respiratory distress. She has no wheezes. She has no rales. She exhibits no tenderness.  Abdominal: She exhibits no distension.  Musculoskeletal:  Right wrist: No obvious swelling or deformity. Tenderness to palpation over medial aspect and dorsal aspect of wrist. ROM deferred due to pain. N/V intact.   Neurological: She is alert and oriented to person, place, and time.  Lying on stretcher in NAD. GCS 15. Speaks in a clear voice. Cranial nerves II through XII grossly intact. 5/5 strength in all extremities. Sensation fully intact.  Bilateral finger-to-nose intact.    Skin: Skin is warm and dry.  Psychiatric: She has a normal mood and affect. Her behavior is normal.  Nursing note and vitals reviewed.    ED Treatments / Results  Labs (all labs ordered are listed, but only abnormal results are displayed) Labs Reviewed  BASIC METABOLIC PANEL - Abnormal; Notable for the following:       Result Value   Glucose, Bld 110 (*)    Creatinine, Ser 1.73 (*)    GFR calc non Af Amer 26 (*)    GFR calc Af Amer 30 (*)    All other components within normal limits  I-STAT TROPOININ, ED - Abnormal; Notable for the following:    Troponin i, poc 0.14 (*)    All other components within normal limits  CBC WITH DIFFERENTIAL/PLATELET  URINALYSIS, ROUTINE W REFLEX MICROSCOPIC  TROPONIN I    EKG  EKG  Interpretation  Date/Time:  Sunday July 18 2016 13:29:33 EST Ventricular Rate:  60 PR Interval:    QRS Duration: 211 QT Interval:  594 QTC Calculation: 594 R Axis:   -28 Text Interpretation:  Sinus rhythm Prolonged PR interval IVCD, consider atypical RBBB LVH with secondary repolarization abnormality Prolonged QT interval Confirmed by RAY MD, Duwayne Heck [phone]) on 07/18/2016 3:37:11 PM       Radiology Dg Sacrum/coccyx  Result Date: 07/18/2016 CLINICAL DATA:  Pain after fall. EXAM: SACRUM AND COCCYX - 2+ VIEW COMPARISON:  None. FINDINGS: No fractures identified. Degenerative changes in the lumbar spine. Rim calcified structure in the left groin better seen on the August 2015 CT scan, thought to be vascular, possibly a pseudoaneurysm. IMPRESSION: 1. No  fracture. 2. Rim calcified vascular structure in the left groin, unchanged since the August 2015 CT scan. Electronically Signed   By: Gerome Sam III M.D   On: 07/18/2016 14:30   Dg Wrist Complete Right  Result Date: 07/18/2016 CLINICAL DATA:  Pt reports she fell approximately 1 hour ago and landed on her bottom and injured her right wrist; she has pain across the proximal radius and ulna and coccyx pain EXAM: RIGHT WRIST - COMPLETE 3+ VIEW COMPARISON:  Pain FINDINGS: There are significant degenerative changes throughout the wrist. Marked joint space narrowing and chondrocalcinosis present, particularly involving the radiocarpal joint, radioulnar joint, and the first carpometacarpal joint. Slightly irregular appearance at the lateral aspect of the radius is favored to be degenerative although a minimally displaced avulsion fracture in this region would be difficult to exclude. There is lucency in the region of the triquetral consistent with fracture. Soft tissue swelling is present. IMPRESSION: 1. Suspect triquetral fracture. 2. Significant degenerative changes. Additional avulsion fractures would be difficult to exclude and follow-up films  may be helpful as needed. Electronically Signed   By: Norva Pavlov [name]   On: 07/18/2016 14:33    Procedures Procedures (including critical care time)  Medications Ordered in ED Medications  sodium chloride 0.9 % bolus 500 mL (not administered)  acetaminophen (TYLENOL) tablet 650 mg (650 mg Oral Given 07/18/16 1342)     Initial Impression / Assessment and Plan / ED Course  I have reviewed the triage vital signs and the nursing notes.  Pertinent labs & imaging results that were available during my care of the patient were reviewed by me and considered in my medical decision making (see chart for details).  Clinical Course    82 year old female who presents with a fall. Unclear etiology. Patient is afebrile, not tachycardic or tachypneic, normotensive, and not hypoxic. CBC unremarkable. BMP remarkable for elevated SCr of 1.73. I-stat troponin is 0.14. Formal troponin ordered. EKG shows SR with prolonged PR and QT with RBBB. Wrist xray remarkable for triquetral fracture. Wrist splint ordered. Xray of sacrum negative.   Due to multiple co-morbidities will admit for observation. Spoke with IM team who will come to see patient. CT head and neck pending. UA pending.   Final Clinical Impressions(s) / ED Diagnoses   Final diagnoses:  Fall, initial encounter  Elevated troponin  Closed nondisplaced fracture of triquetrum of right wrist, initial encounter    New Prescriptions New Prescriptions   No medications on file     Bethel Born, PA-C 07/19/16 [id number]    Margarita Grizzle, MD 07/19/16 [id number]

## 2016-07-18 NOTE — H&P (Signed)
Date: 07/18/2016               Patient Name:  [name] MRN: [id number]  DOB: April 30, 1932 Age / Sex: 82 y.o., female   PCP: Denton Brick, MD         Medical Service: Internal Medicine Teaching Service         Attending Physician: Dr. Margarita Grizzle, MD    First Contact: Dr. Carolynn Comment Pager: [phone]  Second Contact: Dr. Reggie Pile Pager: [phone]       After Hours (After 5p/  First Contact Pager: [phone]  weekends / holidays): Second Contact Pager: [phone]   Chief Complaint: Fall  History of Present Illness: [name] is a 82 y.o. female with a  HFpEF, CAD, PAF w/ RVR, s/p renal tx now CKD II,  h/o PNA w/ recurrent bilateral effusions, who presents after fall from standing.  Patient reports that she was at home in her usual state of health when she got up to go into the bathroom. She reports that she was walking with her oxygen nasal cannula and her cane. She reports that as she entered the bathroom, she lost her balance and began to roll across the wall trying to catch herself but was unable to stop her fall she reports sliding down the wall and landing on her tailbone and her right wrist. Patient reports that she is unsure why she fell, but notes that she might have tripped on her cane. Patient denies any previous similar episodes. Patient denies any loss of consciousness and is able to recount the entire event and she denies hitting her head. She denies any palpitations, chest pain, shortness of breath, dizziness, vertigo which preceded this event. She reports feeling weak in her arms and legs following the event, but denies any focal weakness and has no significant deficits during her interview, reporting that she is returned to her baseline. She reports that after the episode she was able to slide across the floor in order to try to get help. Her neighbor who had spoken with her previously and attempted to call her back after her fall became concerned and went to  check on her when she noticed her on the floor and was able to call EMS for help.  In the emergency department she received x-ray of her right wrist and sacrum which revealed small nondisplaced fracture of the right wrist and no bony abnormality of the sacrum. She was given 500 mL bolus of fluid. Heart rate in the 60s, in normal sinus rhythm. She also received CT of the cervical spine and head which were w/o acute abnormality.  Patient has been on her home O2 nasal cannula with no increase in requirement recently. Patient does however note increase in shortness of breath over the last 2 weeks with 4 pillow orthopnea up from her baseline of 2, and increase in PND to 7 times a night. Since May 2017 patient has been seen by pulmonology for recurrent pleural effusions which first began after pneumonia and hospitalization in the spring. She has had multiple bilateral thoracenteses which have revealed exudative effusion. Her pulmonologist, Dr. Sherene Sires, most recently saw her on 11/28 when he performed another thoracentesis. At that time he recommended referral to thoracic surgery for consideration of VATS. 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 At present she denies any significant cough, sputum production, fever, chills, upper respiratory infection symptoms.  Meds: Current Facility-Administered Medications  Medication Dose Route Frequency Provider Last Rate Last Dose  . sodium chloride 0.9 % bolus 500 mL  500 mL Intravenous Once Bethel Born, PA-C       Current Outpatient Prescriptions  Medication Sig Dispense Refill  . allopurinol (ZYLOPRIM) 100 MG tablet Take 100 mg by mouth daily.     Marland Kitchen amiodarone (PACERONE) 200 MG tablet Take 1 tablet (200 mg total) by mouth 2 (two) times  daily. 60 tablet 12  . amLODipine (NORVASC) 10 MG tablet Take 5 mg by mouth daily.    Marland Kitchen amoxicillin (AMOXIL) 500 MG capsule Take 1 capsule (500 mg total) by mouth 3 (three) times daily. 120 capsule 1  . apixaban (ELIQUIS) 2.5 MG TABS tablet Take 1 tablet (2.5 mg total) by mouth 2 (two) times daily. Cancel previous Eliquis prescription 60 tablet 3  . aspirin EC 81 MG tablet Take 81 mg by mouth daily.    . cinacalcet (SENSIPAR) 90 MG tablet Take 22.5 mg by mouth See admin instructions. Once every one to two weeks    . famotidine (PEPCID) 20 MG tablet Take 20 mg by mouth 2 (two) times daily.    . feeding supplement, ENSURE ENLIVE, (ENSURE ENLIVE) LIQD Take 237 mLs by mouth 2 (two) times daily between meals. 237 mL 12  . furosemide (LASIX) 80 MG tablet Take 80 mg by mouth 2 (two) times daily.    . metoprolol tartrate (LOPRESSOR) 25 MG tablet Take 0.5 tablets (12.5 mg total) by mouth 2 (two) times daily. 30 tablet 0  . mycophenolate (CELLCEPT) 250 MG capsule Take 500 mg by mouth 2 (two) times daily.    . OXYGEN Inhale into the lungs. 2lpm 24/7    . simvastatin (ZOCOR) 20 MG tablet Take 20 mg by mouth every evening.    . tacrolimus (PROGRAF) 0.5 MG capsule 1 mg in the morning and 1 mg at bedtime (Patient taking differently: Take 2-2.5 mg by mouth See admin instructions. Take 2.5 mg by mouth in the morning and take 2 mg by mouth at night.) 120 capsule 0   Allergies: Allergies as of 07/18/2016 - Review Complete 07/18/2016  Allergen Reaction Noted  . Sulfa drugs cross reactors Swelling 12/03/2011   Past Medical History:  Diagnosis Date  . Candidal esophagitis (HCC)    a. 03/2014.  Marland Kitchen Cholelithiasis   . Chronic diastolic CHF (congestive heart failure) (HCC)    a. 06/2011 Echo: EF 60-65%, no rwma, Gr1 DD, mild TR. // b. Echo 12/19/15: Severe LVH, EF 55-60%, normal wall motion, mild MR, moderate LAE, moderate TR, PASP 44 mmHg  . CKD (chronic kidney disease), stage III    a. in setting of prior ESRD and  cadaveric tx in 2003.  Marland Kitchen Coronary artery disease    a. 01/2001 Cath/PCI: LAD 50p, 110m, D1 50-60, RCA 31m (3.0x13 BX Velocity BMS).// b. NSTEMI 5/17 (demand ischemia) in setting of AF with RVR, pneumonia, CKD, a/c HF >> Myoview 12/26/15: prob inf infarct, no ischemia, EF 52%, Low Risk >> med Rx  . Gastritis    a. 03/2014  . Gout    unconfirmed by joint aspiration  . History of bacteremia    a. 08/2004 Group A Strep bacteremia.  . Hypertensive heart disease   . S/P kidney transplant    a. due to FSGN,  follows with Dr. Vivia Birmingham in 12-25-01. Was on HD prior to that.  . Type II diabetes mellitus (HCC)    Family History: Family History  Problem Relation Age of Onset  . CAD Father   . CAD Brother   . CAD      Both parents and multiple siblings have died from coronary disease prior to age 44 and several in their 21's and 76's.   Social History: Social History  . Marital status: Widowed   Social History Main Topics  . Smoking status: Former Smoker    Quit date: 04/19/1969  . Smokeless tobacco: Never Used  . Alcohol use No  . Drug use: No  . Sexual activity: Not Currently   Social History Narrative   Patient's husband died in 25-Dec-2008.  Retired - previously delivered Rx medications.  Lives in [place] with grandchild.  Does not routinely exercise.   Review of Systems: A complete ROS was negative except as per HPI. Review of Systems  Constitutional: Negative for chills, fever and weight loss.  Eyes: Negative for blurred vision.  Respiratory: Positive for shortness of breath. Negative for cough, sputum production and wheezing.   Cardiovascular: Positive for orthopnea and PND. Negative for chest pain, palpitations and leg swelling.  Gastrointestinal: Negative for abdominal pain, constipation, diarrhea, nausea and vomiting.  Genitourinary: Negative for dysuria, frequency and urgency.  Musculoskeletal: Positive for falls. Negative for myalgias.  Skin: Negative for rash.  Neurological:  Negative for dizziness, tremors and headaches.  Endo/Heme/Allergies: Negative for polydipsia.  Psychiatric/Behavioral: The patient is not nervous/anxious.    Physical Exam: Vitals:   07/18/16 1530 07/18/16 1600 07/18/16 1615 07/18/16 1630  BP: (!) 118/53 119/55 125/67 122/75  Pulse: 60 (!) 59 65 63  Resp:   13 22  SpO2: 99% 98% 98% 96%  Weight:      Height:       Physical Exam  Constitutional: She is oriented to person, place, and time. She appears well-developed. She is cooperative. No distress.  HENT:  Head: Normocephalic and atraumatic.  Right Ear: Hearing normal.  Left Ear: Hearing normal.  Nose: Nose normal.  Mouth/Throat: Mucous membranes are normal.  Cardiovascular: Normal rate, regular rhythm, S1 normal, S2 normal and intact distal pulses.  Exam reveals no gallop.   No murmur heard. Pulmonary/Chest: Effort normal and breath sounds normal. No respiratory distress. She has no wheezes. She has no rhonchi. She has no rales. She exhibits no tenderness. Breasts are symmetrical.  Abdominal: Soft. Normal appearance and bowel sounds are normal. She exhibits no ascites. There is no hepatosplenomegaly. There is no tenderness. There is no CVA tenderness.  Musculoskeletal: Normal range of motion. She exhibits tenderness (to tailbone and R wrist (splinted on exam)). She exhibits no edema.  Neurological: She is alert and oriented to person, place, and time. She has normal strength.  Skin: Skin is warm, dry and intact. She is not diaphoretic.  Psychiatric: She has a normal mood and affect. Her speech is normal and behavior is normal.   Labs: CBC:  Recent Labs Lab 07/18/16 1458  WBC 6.5  NEUTROABS 4.4  HGB 12.3  HCT 38.0  MCV 92.2  PLT 223   Basic Metabolic Panel:  Recent Labs Lab 07/18/16 1458  NA 140  K 4.1  CL 105  CO2 27  GLUCOSE 110*  BUN 20  CREATININE 1.73*  CALCIUM 10.1   Cardiac Enzymes:  Recent Labs Lab 07/18/16 1518  TROPIPOC 0.14*   BNP (last 3  results)  Recent Labs  12/18/15 0830 01/19/16 1155 02/03/16 1023  BNP 1,232.3* 384.1* 687.2*   CBG: Lab Results  Component Value Date   HGBA1C 6.9 (H) 12/18/2015   EKG: EKG Interpretation  Date/Time:  Sunday July 18 2016 13:29:33 EST Ventricular Rate:  60 PR Interval:    QRS Duration: 211 QT Interval:  594 QTC Calculation: 594 R Axis:   -28 Text Interpretation:  Sinus rhythm Prolonged PR interval IVCD, consider atypical RBBB LVH with secondary repolarization abnormality Prolonged QT interval Confirmed by RAY MD, Duwayne HeckANIELLE [phone]([id number]) on 07/18/2016 3:37:11 PM  Imaging: Dg Sacrum/coccyx  Result Date: 07/18/2016 CLINICAL DATA:  Pain after fall. EXAM: SACRUM AND COCCYX - 2+ VIEW COMPARISON:  None. FINDINGS: No fractures identified. Degenerative changes in the lumbar spine. Rim calcified structure in the left groin better seen on the August 2015 CT scan, thought to be vascular, possibly a pseudoaneurysm. IMPRESSION: 1. No  fracture. 2. Rim calcified vascular structure in the left groin, unchanged since the August 2015 CT scan. Electronically Signed   By: Gerome Samavid  Williams III M.D   On: 07/18/2016 14:30   Dg Wrist Complete Right  Result Date: 07/18/2016 CLINICAL DATA:  Pt reports she fell approximately 1 hour ago and landed on her bottom and injured her right wrist; she has pain across the proximal radius and ulna and coccyx pain EXAM: RIGHT WRIST - COMPLETE 3+ VIEW COMPARISON:  Pain FINDINGS: There are significant degenerative changes throughout the wrist. Marked joint space narrowing and chondrocalcinosis present, particularly involving the radiocarpal joint, radioulnar joint, and the first carpometacarpal joint. Slightly irregular appearance at the lateral aspect of the radius is favored to be degenerative although a minimally displaced avulsion fracture in this region would be difficult to exclude. There is lucency in the region of the triquetral consistent with fracture. Soft tissue  swelling is present. IMPRESSION: 1. Suspect triquetral fracture. 2. Significant degenerative changes. Additional avulsion fractures would be difficult to exclude and follow-up films may be helpful as needed. Electronically Signed   By: Norva PavlovElizabeth  Brown [name]   On: 07/18/2016 14:33   Assessment & Plan by Problem: Principal Problem:   Fall Active Problems:   S/P kidney transplant   CAD S/P remote RCA PCI   Hypertension   Pleural effusion, bilateral   Chronic diastolic congestive heart failure (HCC)   Right wrist fracture  [name] is a 82 y.o. female with HFpEF, CAD, PAF w/ RVR, s/p renal tx now CKD II,  h/o PNA w/ recurrent bilateral effusions, who presents after fall from standing.  1) Fall: Unclear etiology though story sounds mechanical rather than pre-syncopal. Denies LOC, no prodromal symptoms, dizziness, palpitations. W/u negative in the ED. Suffered non-displaced R wrist fx and contusion of sacrum. Denies focal weakness or deficits. Will check CXR, orthostatics, telemetry, trend trops (initial 0.14), and f/u concern of worsening SOB likely related to recurrent pleural effusions (see #2). - admit to tele - check CXR - orthostatic VS - trend trops x 3  2) SOB: Pt was admitted in May 2017 for PNA and found to have parapneumonic effusions which were drained. Since this time she has been on O2 therapy 24/7 at home with occasional exacerbations of DOE and SOB which have been found 2/2 recurrent effusions (initially exudative, now transudate). She is followed by Dr. Sherene SiresWert and has has multiple thoracenteses. Most recent note from 11/28 suggests possible referal to CVTS for VATS. Clinically euvolemic w/o increase in home O2 requirement, though w/ h/o diastolic HF  w/ c/o worsening orthopnea (now 4 pillow from 2 pillow baseline) and PND (~7x nightly). WBC wnl w/o complaint of cough or URI symptoms. 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 CXR w/ worsening of bilateral  effusions R>>L and possible infiltrate vs atelectasis L mid lung. Consider c/s to pulm/CVTS. - continue home O2, 2L via [place]  3) CAD: Pt w/ h/o CAD s/p PCI in remote past. Trop elevated on admission to 0.14, will trend. Continue home medications simvastain 20mg  qHS, ASA 81mg  qD.  4) PAF: Pt has h/o Afib w/ RVR most recent exacerbation in May 2017 w/ hospitalization for PNA. Is currently on rate control w/ amiodarone 200mg  BID, metoprolol 25mg  BID. AC on Eliquis 2.5mg  BID. Currently in NS, HR 60s. - follow telemetry for concern of RVR which may have caused syncopal fall  5) HTN: BPs stable at present. Will hold home amlodipine 10mg  qD and furosemide 80mg  BID for concern of orthostasis, can restart if orthostatic VSS.  6) CKDII: SCr 1.7 (b/l around 1.4-1.8). S/p renal tx on tacrolimus 2.5-2mg  BID, mycophenolate 500mg  BID, furosemide 80mg  BID.  DVT PPx - Eliquis  Code Status - DNR, discussed w/ pt  Dispo: Admit patient to Inpatient with expected length of stay greater than 2 midnights.  Signed: Carolynn CommentBryan Kion Huntsberry, MD 07/18/2016, 4:46 PM  Pager: [phone]

## 2016-07-18 NOTE — Progress Notes (Signed)
Orthopedic Tech Progress Note Patient Details:  [name] [birth year] [id number]  Ortho Devices Type of Ortho Device: Velcro wrist splint Ortho Device/Splint Interventions: Application   Saul FordyceJennifer C Arayla Kruschke 07/18/2016, 4:34 PM

## 2016-07-18 NOTE — Progress Notes (Signed)
Notified Internal Medicine of [place] [facility],

## 2016-07-18 NOTE — ED Triage Notes (Signed)
Pt. Coming from home via [facility] for a mechanical fall she had today. Pt. Usually uses cane to get around. Pt. States she slipped and fell on her bottom and right wrist. Pt. C/o pain on both. Pt. Denies hitting her head. Pt. Also denies neck or back pain. Pt. Hx of CHF and on 2L nasal cannula at home all the time. Pt. Aox4.

## 2016-07-18 NOTE — ED Notes (Signed)
Report called and given to nurse on 5N 

## 2016-07-18 NOTE — Progress Notes (Signed)
RN notified Internal Medicine for patients request for additional pain medication.

## 2016-07-19 DIAGNOSIS — R778 Other specified abnormalities of plasma proteins: Secondary | ICD-10-CM

## 2016-07-19 DIAGNOSIS — Z955 Presence of coronary angioplasty implant and graft: Secondary | ICD-10-CM | POA: Diagnosis not present

## 2016-07-19 DIAGNOSIS — I480 Paroxysmal atrial fibrillation: Secondary | ICD-10-CM

## 2016-07-19 DIAGNOSIS — Z452 Encounter for adjustment and management of vascular access device: Secondary | ICD-10-CM | POA: Diagnosis not present

## 2016-07-19 DIAGNOSIS — Z66 Do not resuscitate: Secondary | ICD-10-CM

## 2016-07-19 DIAGNOSIS — E1122 Type 2 diabetes mellitus with diabetic chronic kidney disease: Secondary | ICD-10-CM | POA: Diagnosis present

## 2016-07-19 DIAGNOSIS — Z940 Kidney transplant status: Secondary | ICD-10-CM

## 2016-07-19 DIAGNOSIS — I130 Hypertensive heart and chronic kidney disease with heart failure and stage 1 through stage 4 chronic kidney disease, or unspecified chronic kidney disease: Secondary | ICD-10-CM

## 2016-07-19 DIAGNOSIS — J9611 Chronic respiratory failure with hypoxia: Secondary | ICD-10-CM | POA: Diagnosis present

## 2016-07-19 DIAGNOSIS — I440 Atrioventricular block, first degree: Secondary | ICD-10-CM | POA: Diagnosis present

## 2016-07-19 DIAGNOSIS — I1310 Hypertensive heart and chronic kidney disease without heart failure, with stage 1 through stage 4 chronic kidney disease, or unspecified chronic kidney disease: Secondary | ICD-10-CM | POA: Diagnosis present

## 2016-07-19 DIAGNOSIS — Z882 Allergy status to sulfonamides status: Secondary | ICD-10-CM

## 2016-07-19 DIAGNOSIS — Z7982 Long term (current) use of aspirin: Secondary | ICD-10-CM

## 2016-07-19 DIAGNOSIS — N2581 Secondary hyperparathyroidism of renal origin: Secondary | ICD-10-CM | POA: Diagnosis present

## 2016-07-19 DIAGNOSIS — Z9181 History of falling: Secondary | ICD-10-CM | POA: Diagnosis not present

## 2016-07-19 DIAGNOSIS — Z7901 Long term (current) use of anticoagulants: Secondary | ICD-10-CM

## 2016-07-19 DIAGNOSIS — I5032 Chronic diastolic (congestive) heart failure: Secondary | ICD-10-CM

## 2016-07-19 DIAGNOSIS — Z87891 Personal history of nicotine dependence: Secondary | ICD-10-CM

## 2016-07-19 DIAGNOSIS — N183 Chronic kidney disease, stage 3 (moderate): Secondary | ICD-10-CM | POA: Diagnosis present

## 2016-07-19 DIAGNOSIS — S62114A Nondisplaced fracture of triquetrum [cuneiform] bone, right wrist, initial encounter for closed fracture: Secondary | ICD-10-CM

## 2016-07-19 DIAGNOSIS — Z79899 Other long term (current) drug therapy: Secondary | ICD-10-CM | POA: Diagnosis not present

## 2016-07-19 DIAGNOSIS — Z8249 Family history of ischemic heart disease and other diseases of the circulatory system: Secondary | ICD-10-CM

## 2016-07-19 DIAGNOSIS — Z9981 Dependence on supplemental oxygen: Secondary | ICD-10-CM

## 2016-07-19 DIAGNOSIS — N182 Chronic kidney disease, stage 2 (mild): Secondary | ICD-10-CM

## 2016-07-19 DIAGNOSIS — Z4682 Encounter for fitting and adjustment of non-vascular catheter: Secondary | ICD-10-CM | POA: Diagnosis not present

## 2016-07-19 DIAGNOSIS — J90 Pleural effusion, not elsewhere classified: Principal | ICD-10-CM

## 2016-07-19 DIAGNOSIS — R918 Other nonspecific abnormal finding of lung field: Secondary | ICD-10-CM | POA: Diagnosis not present

## 2016-07-19 DIAGNOSIS — Z8701 Personal history of pneumonia (recurrent): Secondary | ICD-10-CM

## 2016-07-19 DIAGNOSIS — I2510 Atherosclerotic heart disease of native coronary artery without angina pectoris: Secondary | ICD-10-CM

## 2016-07-19 DIAGNOSIS — M80031A Age-related osteoporosis with current pathological fracture, right forearm, initial encounter for fracture: Secondary | ICD-10-CM

## 2016-07-19 DIAGNOSIS — J918 Pleural effusion in other conditions classified elsewhere: Secondary | ICD-10-CM | POA: Diagnosis not present

## 2016-07-19 DIAGNOSIS — W1839XA Other fall on same level, initial encounter: Secondary | ICD-10-CM | POA: Diagnosis present

## 2016-07-19 DIAGNOSIS — Y92002 Bathroom of unspecified non-institutional (private) residence single-family (private) house as the place of occurrence of the external cause: Secondary | ICD-10-CM | POA: Diagnosis not present

## 2016-07-19 DIAGNOSIS — R0600 Dyspnea, unspecified: Secondary | ICD-10-CM | POA: Diagnosis not present

## 2016-07-19 LAB — TROPONIN I: Troponin I: 0.17 ng/mL (ref <0.03)

## 2016-07-19 LAB — MRSA PCR SCREENING: MRSA BY PCR: NEGATIVE

## 2016-07-19 MED ORDER — TRAMADOL HCL 50 MG PO TABS: 100.0000 mg | ORAL_TABLET | Freq: Four times a day (QID) | ORAL | Status: DC

## 2016-07-19 MED ORDER — OXYCODONE-ACETAMINOPHEN 5-325 MG PO TABS: 1.0000 | ORAL_TABLET | Freq: Four times a day (QID) | ORAL | Status: DC | PRN

## 2016-07-19 MED ORDER — OXYCODONE-ACETAMINOPHEN 5-325 MG PO TABS
1.0000 | ORAL_TABLET | ORAL | Status: DC
  Administered 2016-07-19: 1 via ORAL
  Filled 2016-07-19: qty 1

## 2016-07-19 MED ORDER — OXYCODONE-ACETAMINOPHEN 5-325 MG PO TABS
1.0000 | ORAL_TABLET | ORAL | Status: DC | PRN
  Administered 2016-07-19 – 2016-07-21 (×4): 1 via ORAL
  Filled 2016-07-19 (×4): qty 1

## 2016-07-19 MED ORDER — TRAMADOL HCL 50 MG PO TABS
100.0000 mg | ORAL_TABLET | Freq: Two times a day (BID) | ORAL | Status: DC
  Administered 2016-07-19 – 2016-07-21 (×5): 100 mg via ORAL
  Filled 2016-07-19 (×5): qty 2

## 2016-07-19 MED ORDER — TRAMADOL HCL 50 MG PO TABS: 100.0000 mg | ORAL_TABLET | Freq: Two times a day (BID) | ORAL | 0 refills | Status: DC

## 2016-07-19 MED ORDER — OXYCODONE-ACETAMINOPHEN 5-325 MG PO TABS: 1.0000 | ORAL_TABLET | ORAL | 0 refills | Status: DC | PRN

## 2016-07-19 NOTE — H&P (Signed)
Internal Medicine Attending Admission Note  I saw and evaluated the patient. I reviewed the resident's note and I agree with the resident's findings and plan as documented in the resident's note.  Assessment & Plan by Problem:  Principal Problem:   Fall, right wrist fracture, Osteoporosis,  S/P kidney transplant - Add percocet for pain control.  Given this was a fall from standing height this would be considered an osteoporotic fracture.  Would check Vitamin D and PTH given CKD and s/p transplant.  Chronic dyspnea secondary to recurrent right  Pleural effusion - Will discuss with CVTS if she would be a candidate to have pleural cath placed during this hospitalization     Elevated Troponin, CAD S/P remote RCA PCI - No chest pain, minor elevations in Troponin not clinically significant.    Hypertension - Hold metoprolol due to bradycardia - Hold amlodipine - Monitor    Chronic diastolic congestive heart failure (HCC) - monitor fluid status   Chief Complaint(s): fall  History - key components related to admission:  Briefly [name] is a 82 year old female with a past medical history of congestive heart failure with preserved ejection fraction, coronary artery disease, paroxysmal A. fib with a history of RVR, ESRD status post renal transplant, history of pneumonia with recurrent pleural effusions who presented to the ED after a fall. Yesterday she was in her normal state of health when she fell onto her right wrist while walking to the bathroom, she suspects that she tripped on her cane. She denies any palpitations, chest pain, dizziness or vertigo. She has chronic shortness of breath which she associates with her recurrent pleural effusions and notes that she has been slightly more short of breath when lying down lately.  In the ED x-rays of her wrist revealed a small nondisplaced fracture of her right wrist. Her current complaint is only pain in her right wrist, she reports tylenol has  been ineffective.  Lab results: Reviewed in Epic  Physical Exam - key components related to admission:  General: resting in bed HEENT: EOMI, no scleral icterus Cardiac: RRR, no rubs, murmurs or gallops Pulm: clear to auscultation bilaterally, moving normal volumes of air Abd: soft, nontender, nondistended, BS present Ext: right wrist in forearm brace, good capillary flow to fingernails, other extremities warm and well perfused, no pedal edema   Vitals:   07/18/16 2021 07/19/16 0321 07/19/16 0622 07/19/16 1058  BP: (!) 139/57 (!) 117/54 (!) 115/46 (!) 110/42  Pulse: 65 (!) 48 (!) 48 (!) 44  Resp: 18 16 16 16   Temp: 98.3 F (36.8 C) 97.5 F (36.4 C) 97.6 F (36.4 C)   TempSrc: Oral Oral Oral   SpO2: 98% 100% 99% 100%  Weight:      Height:

## 2016-07-19 NOTE — Progress Notes (Signed)
Vitals: temperature 97.5, blood pressure 117/54, pulse 48, respirations 16, 100% saturation on 2 liters. Internal Medicine made aware. Nursing will continue to monitor.

## 2016-07-19 NOTE — Progress Notes (Signed)
Procedure(s) (LRB): INSERTION PLEURAL DRAINAGE CATHETER (Right) Subjective:  Patient examined, chest x-ray and previous CT scan of chest personally reviewed and counseled with patient  82 year old female admitted with right wrist fracture. She has known recurrent right pleural effusion and has had several thoracentesis procedures, the last being 2 weeks ago at which time 1.2 L of nonmalignant fluid was removed. She has chronic heart failure atrial fibrillation on Eliquis. She was evaluated and recommended for Pleurx catheter over this year but she declined. She now wishes to have a Pleurx catheter placed to manage her recurrent right pleural effusion to avoid multiple future thoracentesis procedures. 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 She is immunosuppressed from previous kidney transplant.   I discussed the procedure of Pleurx catheter placement in detail with the patient as well as her family including the indications benefits and risks of bleeding infection and recurrent effusion.  Objective: Vital signs in last 24 hours: Temp:  [97.5 F (36.4 C)-98.3 F (36.8 C)] 97.6 F (36.4 C) (12/18 0622) Pulse Rate:  [44-65] 44 (12/18 1058) Cardiac Rhythm: Sinus bradycardia;Bundle branch block;Heart block (12/18 0700) Resp:  [12-18] 16 (12/18 1058) BP: (109-139)/(42-58) 110/42 (12/18 1058) SpO2:  [98 %-100 %] 100 % (12/18 1058)  Hemodynamic parameters for last 24 hours:    Intake/Output from previous day: 12/17 0701 - 12/18 0700 In: 500 [I.V.:500] Out: -  Intake/Output this shift: No intake/output data recorded.  Elderly frail female no acute distress with a splint on her right wrist No distended neck veins or carotid bruit Diminished breath sounds on the right Splint on right wrist 2+ pedal edema Abdomen nontender  Lab Results:  Recent Labs  07/18/16 1458  WBC 6.5  HGB  12.3  HCT 38.0  PLT 223   BMET:  Recent Labs  07/18/16 1458  NA 140  K 4.1  CL 105  CO2 27  GLUCOSE 110*  BUN 20  CREATININE 1.73*  CALCIUM 10.1    PT/INR: No results for input(s): LABPROT, INR in the last 72 hours. ABG    Component Value Date/Time   PHART 7.474 (H) 12/20/2015 1109   HCO3 18.4 (L) 12/20/2015 1109   TCO2 19.2 12/20/2015 1109   ACIDBASEDEF 4.6 (H) 12/20/2015 1109   O2SAT 94.2 12/20/2015 1109   CBG (last 3)  No results for input(s): GLUCAP in the last 72 hours.  Assessment/Plan: S/P Procedure(s) (LRB): INSERTION PLEURAL DRAINAGE CATHETER (Right) Plan right Pleurx catheter placement tomorrow midday. Hold eliquis  Orders placed  LOS: 1 day    Kathlee Nationseter Van Trigt III 07/19/2016

## 2016-07-19 NOTE — Care Management Obs Status (Signed)
MEDICARE OBSERVATION STATUS NOTIFICATION   Patient Details  Name: [name] MRN: [id number] Date of Birth: 05-25-1932   Medicare Observation Status Notification Given:  Yes  Patient unable to sign notice due to her right wrist injury and being right handed. Case manager read notice to patient and she understands. Copy provided.   Durenda GuthrieBrady, Rola Lennon Naomi, RN 07/19/2016, 1:18 PM

## 2016-07-19 NOTE — Discharge Summary (Signed)
Name: [name] MRN: [id number] DOB: 03-04-1932 82 y.o. PCP: Joy Brickiana M Truong, MD  Date of Admission: 07/18/2016  1:21 PM Date of Discharge: 07/21/2016 Attending Physician: Gust RungErik C Hoffman, DO  Discharge Diagnosis: Principal Problem:   Fall Active Problems:   S/P kidney transplant   CAD S/P remote RCA PCI   Hypertension   Pleural effusion, bilateral   Chronic diastolic congestive heart failure (HCC)   Chronic respiratory failure with hypoxia (HCC)   Right wrist fracture   COPD (chronic obstructive pulmonary disease) (HCC)   Closed nondisplaced fracture of triquetral bone of right wrist   Secondary hyperparathyroidism of renal origin (HCC)   Vitamin D deficiency   Discharge Medications: Allergies as of 07/21/2016      Reactions   Sulfa Drugs Cross Reactors Swelling      Medication List    STOP taking these medications   furosemide 80 MG tablet Commonly known as:  LASIX   metoprolol tartrate 25 MG tablet Commonly known as:  LOPRESSOR     TAKE these medications   allopurinol 100 MG tablet Commonly known as:  ZYLOPRIM Take 100 mg by mouth daily.   amiodarone 200 MG tablet Commonly known as:  PACERONE Take 1 tablet (200 mg total) by mouth 2 (two) times daily.   amLODipine 10 MG tablet Commonly known as:  NORVASC Take 5 mg by mouth daily.   amoxicillin 500 MG capsule Commonly known as:  AMOXIL Take 1 capsule (500 mg total) by mouth 3 (three) times daily.   apixaban 2.5 MG Tabs tablet Commonly known as:  ELIQUIS Take 1 tablet (2.5 mg total) by mouth 2 (two) times daily. Cancel previous Eliquis prescription Start taking on:  07/23/2016   aspirin EC 81 MG tablet Take 81 mg by mouth daily.   cinacalcet 90 MG tablet Commonly known as:  SENSIPAR Take 22.5 mg by mouth See admin instructions. Once every one to two weeks   famotidine 20 MG tablet Commonly known as:  PEPCID Take 20 mg by mouth 2 (two) times daily.   feeding supplement (ENSURE ENLIVE)  Liqd Take 237 mLs by mouth 2 (two) times daily between meals.   mupirocin ointment 2 % Commonly known as:  BACTROBAN Place 1 application into the nose 2 (two) times daily.   mycophenolate 250 MG capsule Commonly known as:  CELLCEPT Take 500 mg by mouth 2 (two) times daily.   oxyCODONE-acetaminophen 5-325 MG tablet Commonly known as:  PERCOCET/ROXICET Take 1 tablet by mouth every 4 (four) hours as needed for severe pain.   OXYGEN Inhale into the lungs. 2lpm 24/7   simvastatin 20 MG tablet Commonly known as:  ZOCOR Take 20 mg by mouth every evening.   tacrolimus 0.5 MG capsule Commonly known as:  PROGRAF 1 mg in the morning and 1 mg at bedtime What changed:  how much to take  how to take this  when to take this  additional instructions   traMADol 50 MG tablet Commonly known as:  ULTRAM Take 2 tablets (100 mg total) by mouth 2 (two) times daily.   Vitamin D (Cholecalciferol) 1000 units Caps Take 1,000 Units by mouth daily.      Disposition and follow-up:   [name] [facility].  At the hospital follow up visit please address:  1.  Osteoporotic R wrist fracture, fall: assess for adequate pain control and tolerance of splint, PTH elevated, Vit D low likely 2/2 CKD,  repleting and continue to follow Recurrent pleural effusion: follow up recommendations from Thoracic surgery regarding pleural catheter, will have HH help. Assess for s/s of continued bleeding H&H stable at discharge. Follow effusion w/ CXR, Hgb w/ CBC. Restart eliquis on 12/22.  2.  Labs / imaging needed at time of follow-up: Consider repeat CXR, CBC, repeats of Vitamin D, Calcium (BMP or renal function panel), PTH, and phosphorous in about 6-8 weeks  Follow-up Appointments: Follow-up Information    [facility]. Go on 07/23/2016.   Why:  Go for a follow up appt at 10:15am Contact information: [address] [place] [address] [phone]       [facility]. Go on 07/23/2016.   Why:  Go for a follow up appointment with your pulmonologist at 2:15pm       [facility] Follow up.   Why:  Someone from [facility] will contact you to arrange start date and time for therapy. Contact information: [address] [place] [address] [phone]          Hospital Course by problem list: Principal Problem:   Fall Active Problems:   S/P kidney transplant   CAD S/P remote RCA PCI   Hypertension   Pleural effusion, bilateral   Chronic diastolic congestive heart failure (HCC)   Chronic respiratory failure with hypoxia (HCC)   Right wrist fracture   COPD (chronic obstructive pulmonary disease) (HCC)   Closed nondisplaced fracture of triquetral bone of right wrist   Secondary hyperparathyroidism of renal origin (HCC)   Vitamin D deficiency   1. Fall from standing, non-displaced R wrist fracture: Pt suffered a mechanical fall from standing and was found to have a fracture of her R wrist. She underwent cervical spine and head CT which showed. No abnormality. She was discharged with pain control and a splint. As this would be considered an osteoporotic fracture and she is s/p renal transplant, we have sent a Vit D and PTH. PTH was elevated, Vit D low. Will replace at discharge.  2. Chronic hypoxic respiratory failure with hypoxia, recurrent pleural effusions: Pt was admitted in May 2017 for PNA and found to have parapneumonic effusions which were drained. Since this time she has been on O2 therapy 24/7 at home with occasional exacerbations of DOE and SOB which have been found 2/2 recurrent effusions (initially exudative, now transudate). She is followed by Dr. Sherene Sires and has has multiple thoracenteses. Most recent note from 11/28 suggested referal to CVTS for possible VATS. She was seen by Dr. Dorris Fetch on 12/5 who recommend pleural catheter  rather than VATS which would be high risk for her. She was clinically euvolemic on exam w/o increase in home O2 requirement. She does have a h/o diastolic HF w/ c/o worsening orthopnea (now 4 pillow from 2 pillow baseline) and PND (~7x nightly) for the last 2 weeks. WBC wnl w/o complaint of cough or URI symptoms. 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 CXR demonstrated worsening of bilateral effusions R>>L and possible infiltrate vs atelectasis L mid lung. We consulted CVTS who placed a Pleurx catheter on HD2. She was discharged to follow up with Pulmonology and CVTS and an appointment was made w/ her PCP.  Discharge Vitals:   BP (!) 120/52 (BP Location: Left Arm)   Pulse 66   Temp 98.7 F (37.1 C) (Oral)   Resp 16   Ht 5\' 3"  (1.6 m)  Wt 152 lb (68.9 kg)   SpO2 97%   BMI 26.93 kg/m   Pertinent Labs, Studies, and Procedures: As above.  Procedures Performed:  Dg Chest 2 View  IMPRESSION: 1. Moderate right pleural effusion with underlying opacity, significantly worsened in the interval. 2. Mild opacity in the periphery of the left mid lung may represent atelectasis or infiltrate. Recommend follow-up to resolution. 3. Probable smaller layering effusion on the left.  Dg Sacrum/coccyx  IMPRESSION: 1. No  fracture. 2. Rim calcified vascular structure in the left groin, unchanged since the August 2015 CT scan.  Dg Wrist Complete Right  IMPRESSION: 1. Suspect triquetral fracture. 2. Significant degenerative changes. Additional avulsion fractures would be difficult to exclude and follow-up films may be helpful as needed.  Ct Cervical Spine and Head Wo Contrast  IMPRESSION: CT of the head: Chronic changes without acute intracranial abnormality. Changes of acute sinusitis as described. Some chronic changes are noted in the left maxillary antrum. CT of the cervical spine: Degenerative change without acute abnormality.  Consultations: Treatment Team:  Kerin PernaPeter Van Trigt,  MD  Discharge Instructions: Discharge Instructions    Diet - low sodium heart healthy    Complete by:  As directed    Discharge instructions    Complete by:  As directed    You have a fractured wrist. This should heal on its own, but it will be important to continue wearing the splint for the next several weeks. We have prescribed pain medication for you for the next several days. After these prescriptions you can take Tylenol for your pain. Your blood pressure and HR have also been low while in the hospital. Please do not take your metoprolol and your lasix fluid pill until your follow-up appointment. We want you to follow up with your doctors in the [facility] Medicine Center. We have also stopped your Eliquis due to the procedure to place the catheter in your chest. You should not restart this medication until 12/22. If you notice symptoms of increased bleeding from this site, please call your doctor or go to the Emergency Room.  The surgeons have placed a catheter in your chest to help drain the recurrent fluid from your right lung. You will have home health to help you manage this catheter and teach you have to properly clean and drain it. You will need to follow up with the thoracic surgeon outside the hospital. You should also follow up with your pulmonologist outside the hospital.   Increase activity slowly    Complete by:  As directed      Signed: Carolynn CommentBryan Yaneli Keithley, MD 07/21/2016, 2:07 PM   Pager: [phone]

## 2016-07-19 NOTE — Progress Notes (Signed)
Notified Internal Medicine about patients heart rate notification from CCMD. Will take a set of vital signs per Internal Medicine orders and update doctor.

## 2016-07-19 NOTE — Progress Notes (Addendum)
Subjective: Currently, the patient is complaining of R wrist pain not relieved by tylenol and tramodol. Will add percocet. SOB unchanged, saturation good on 2L [place]. No CP.  Objective: Vital signs in last 24 hours: Vitals:   07/18/16 1900 07/18/16 2021 07/19/16 0321 07/19/16 0622  BP: (!) 127/54 (!) 139/57 (!) 117/54 (!) 115/46  Pulse: 64 65 (!) 48 (!) 48  Resp: 16 18 16 16   Temp:  98.3 F (36.8 C) 97.5 F (36.4 C) 97.6 F (36.4 C)  TempSrc:  Oral Oral Oral  SpO2: 98% 98% 100% 99%  Weight:      Height:       Intake/Output:  12/17 0701 - 12/18 0700 In: 500 [I.V.:500] Out: -     Physical Exam: Physical Exam  Constitutional: She appears well-developed. She is cooperative. No distress.  Cardiovascular: Normal rate, regular rhythm, normal heart sounds and normal pulses.  Exam reveals no gallop.   No murmur heard. Pulmonary/Chest: Effort normal. No respiratory distress. She has decreased breath sounds in the right lower field. She has no wheezes. She has no rhonchi. She has no rales. Breasts are symmetrical.  Abdominal: Soft. Bowel sounds are normal. There is no tenderness.  Musculoskeletal: She exhibits no edema.   Labs: CBC:  Recent Labs Lab 07/18/16 1458  WBC 6.5  NEUTROABS 4.4  HGB 12.3  HCT 38.0  MCV 92.2  PLT 223   Metabolic Panel:  Recent Labs Lab 07/18/16 1458  NA 140  K 4.1  CL 105  CO2 27  GLUCOSE 110*  BUN 20  CREATININE 1.73*  CALCIUM 10.1   Cardiac Labs:  Recent Labs Lab 07/18/16 1458 07/18/16 1518 07/18/16 2040 07/19/16 0229  TROPIPOC  --  0.14*  --   --   TROPONINI 0.11*  --  0.12* 0.17*   BG: No results for input(s): GLUCAP in the last 168 hours. Lab Results  Component Value Date   HGBA1C 6.9 (H) 12/18/2015    Medications: Scheduled Medications: . allopurinol  100 mg Oral Daily  . amiodarone  200 mg Oral BID  . amoxicillin  500 mg Oral TID  . apixaban  2.5 mg Oral BID  . aspirin EC  81 mg Oral Daily  . famotidine  20 mg  Oral BID  . feeding supplement (ENSURE ENLIVE)  237 mL Oral BID BM  . metoprolol tartrate  12.5 mg Oral BID  . mycophenolate  500 mg Oral BID  . oxyCODONE-acetaminophen  1 tablet Oral Q4H  . simvastatin  20 mg Oral QPM  . tacrolimus  2.5 mg Oral Daily   And  . tacrolimus  2 mg Oral QHS   PRN Medications: traMADol  Assessment/Plan: [name] is a 82 y.o. female with HFpEF, CAD, PAF w/ RVR, s/p renal tx now CKD II,  h/o PNA w/ recurrent bilateral effusions, who presents after fall from standing.  1) Fall: Appears mechanical. R wrist fx, will manage pain w/ tramodol scheduled and percocet PRN - 100mg  tramadol q12h - 5-325mg  Percocet q4h PRN breakthrough  2) SOB: Pt was admitted in May 2017 for PNA and found to have parapneumonic effusions which were drained. Since this time she has been on O2 therapy 24/7 at home with occasional exacerbations of DOE and SOB which have been found 2/2 recurrent effusions (initially exudative, now transudate). She is followed by Dr. Sherene SiresWert and has has multiple thoracenteses. Most recent note from 11/28 suggests possible referal to CVTS for VATS. Clinically euvolemic w/o increase  in home O2 requirement, though w/ h/o diastolic HF w/ c/o worsening orthopnea (now 4 pillow from 2 pillow baseline) and PND (~7x nightly). WBC wnl w/o complaint of cough or URI symptoms. 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 CXR w/ worsening of bilateral effusions R>>L and possible infiltrate vs atelectasis L mid lung. - CVTS recommended pleural cath on 12/5 appointment, will discuss w/ thoracic surg and will plan for procedure tomorrow - continue home O2, 2L via [place]  3) CAD: Pt w/ h/o CAD s/p PCI in remote past. Trop mildly elevated on admission appears to have baseline elevation on prior admissions. No indications of ischemia w/ unchanged EKG. Continue home medications simvastain 20mg  qHS, ASA 81mg  qD.  4) PAF: Pt has h/o Afib w/ RVR most recent  exacerbation in May 2017 w/ hospitalization for PNA. Is currently on rate control w/ amiodarone 200mg  BID, metoprolol 25mg  BID. AC on Eliquis 2.5mg  BID. Brady ON to 30s while asleep but asymptomatic w/ HR 48 on exam this AM. Will hold metop.  5) HTN: BPs stable at present. Will hold home amlodipine 10mg  qD and furosemide 80mg  BID for soft BPs. Will recommend f/u w/ PCP this to reassess BP and restart if needed.  6) CKDII: SCr 1.7 (b/l around 1.4-1.8). S/p renal tx on tacrolimus 2.5-2mg  BID, mycophenolate 500mg  BID, furosemide 80mg  BID. Holding furosemide as above.  Length of Stay: 1 day(s) Dispo: Anticipated discharge 1-2 days pending pleural catheter procedure.  Carolynn CommentBryan Keesha Pellum, MD Pager: [phone] (7AM-5PM) 07/19/2016, 9:39 AM

## 2016-07-20 ENCOUNTER — Inpatient Hospital Stay (HOSPITAL_COMMUNITY): Payer: Medicare Other

## 2016-07-20 ENCOUNTER — Encounter (HOSPITAL_COMMUNITY)
Admission: EM | Disposition: A | Discharge status: Home Health | Payer: Self-pay | Source: Home / Self Care | Attending: Internal Medicine

## 2016-07-20 ENCOUNTER — Inpatient Hospital Stay (HOSPITAL_COMMUNITY): Payer: Medicare Other | Admitting: Certified Registered Nurse Anesthetist

## 2016-07-20 ENCOUNTER — Encounter (HOSPITAL_COMMUNITY): Payer: Self-pay | Admitting: Certified Registered Nurse Anesthetist

## 2016-07-20 HISTORY — PX: CHEST TUBE INSERTION: SHX231

## 2016-07-20 LAB — APTT: aPTT: 40 seconds — ABNORMAL HIGH (ref 24–36)

## 2016-07-20 LAB — PROTIME-INR
INR: 1.51
Prothrombin Time: 18.4 seconds — ABNORMAL HIGH (ref 11.4–15.2)

## 2016-07-20 LAB — PARATHYROID HORMONE, INTACT (NO CA): PTH: 87 pg/mL — AB (ref 15–65)

## 2016-07-20 LAB — SURGICAL PCR SCREEN
MRSA, PCR: POSITIVE — AB
Staphylococcus aureus: POSITIVE — AB

## 2016-07-20 LAB — GLUCOSE, CAPILLARY: Glucose-Capillary: 96 mg/dL (ref 65–99)

## 2016-07-20 SURGERY — INSERTION, PLEURAL DRAINAGE CATHETER
Anesthesia: Monitor Anesthesia Care | Site: Chest | Laterality: Right

## 2016-07-20 MED ORDER — MIDAZOLAM HCL 5 MG/5ML IJ SOLN
INTRAMUSCULAR | Status: DC | PRN
  Administered 2016-07-20: 2 mg via INTRAVENOUS

## 2016-07-20 MED ORDER — FENTANYL CITRATE (PF) 100 MCG/2ML IJ SOLN
INTRAMUSCULAR | Status: DC | PRN
  Administered 2016-07-20 (×2): 50 ug via INTRAVENOUS

## 2016-07-20 MED ORDER — MEPERIDINE HCL 25 MG/ML IJ SOLN: 6.2500 mg | INTRAMUSCULAR | Status: DC | PRN

## 2016-07-20 MED ORDER — MUPIROCIN 2 % EX OINT
1.0000 "application " | TOPICAL_OINTMENT | Freq: Two times a day (BID) | CUTANEOUS | Status: DC
  Administered 2016-07-20 – 2016-07-21 (×3): 1 via NASAL
  Filled 2016-07-20 (×2): qty 22

## 2016-07-20 MED ORDER — LIDOCAINE 2% (20 MG/ML) 5 ML SYRINGE
INTRAMUSCULAR | Status: AC
  Filled 2016-07-20: qty 5

## 2016-07-20 MED ORDER — FENTANYL CITRATE (PF) 100 MCG/2ML IJ SOLN: 25.0000 ug | INTRAMUSCULAR | Status: DC | PRN

## 2016-07-20 MED ORDER — FENTANYL CITRATE (PF) 100 MCG/2ML IJ SOLN
INTRAMUSCULAR | Status: AC
  Filled 2016-07-20: qty 2

## 2016-07-20 MED ORDER — FAMOTIDINE 20 MG PO TABS
20.0000 mg | ORAL_TABLET | Freq: Every day | ORAL | Status: DC
  Administered 2016-07-21: 20 mg via ORAL
  Filled 2016-07-20 (×2): qty 1

## 2016-07-20 MED ORDER — CHLORHEXIDINE GLUCONATE CLOTH 2 % EX PADS
6.0000 | MEDICATED_PAD | Freq: Every day | CUTANEOUS | Status: DC
  Administered 2016-07-21: 6 via TOPICAL

## 2016-07-20 MED ORDER — LIDOCAINE HCL 1 % IJ SOLN
INTRAMUSCULAR | Status: DC | PRN
  Administered 2016-07-20: 9 mL

## 2016-07-20 MED ORDER — PROMETHAZINE HCL 25 MG/ML IJ SOLN
12.5000 mg | Freq: Once | INTRAMUSCULAR | Status: AC | PRN
  Administered 2016-07-21: 12.5 mg via INTRAVENOUS
  Filled 2016-07-20: qty 1

## 2016-07-20 MED ORDER — CEFAZOLIN SODIUM 1 G IJ SOLR
INTRAMUSCULAR | Status: DC | PRN
  Administered 2016-07-20: 2 g via INTRAMUSCULAR

## 2016-07-20 MED ORDER — LACTATED RINGERS IV SOLN
INTRAVENOUS | Status: DC | PRN
  Administered 2016-07-20: 14:00:00 via INTRAVENOUS

## 2016-07-20 MED ORDER — PROMETHAZINE HCL 25 MG/ML IJ SOLN: 6.2500 mg | INTRAMUSCULAR | Status: DC | PRN

## 2016-07-20 MED ORDER — 0.9 % SODIUM CHLORIDE (POUR BTL) OPTIME
TOPICAL | Status: DC | PRN
  Administered 2016-07-20: 1000 mL

## 2016-07-20 MED ORDER — MIDAZOLAM HCL 2 MG/2ML IJ SOLN
INTRAMUSCULAR | Status: AC
  Filled 2016-07-20: qty 2

## 2016-07-20 MED ORDER — HYDROMORPHONE HCL 2 MG/ML IJ SOLN
0.5000 mg | Freq: Once | INTRAMUSCULAR | Status: AC
  Administered 2016-07-20: 0.5 mg via INTRAVENOUS
  Filled 2016-07-20: qty 1

## 2016-07-20 SURGICAL SUPPLY — 30 items
CANISTER SUCTION 2500CC (MISCELLANEOUS) ×3 IMPLANT
COVER SURGICAL LIGHT HANDLE (MISCELLANEOUS) ×3 IMPLANT
DERMABOND ADVANCED (GAUZE/BANDAGES/DRESSINGS) ×2
DERMABOND ADVANCED .7 DNX12 (GAUZE/BANDAGES/DRESSINGS) ×1 IMPLANT
DRAPE C-ARM 42X72 X-RAY (DRAPES) ×3 IMPLANT
DRAPE LAPAROSCOPIC ABDOMINAL (DRAPES) ×3 IMPLANT
GLOVE BIO SURGEON STRL SZ 6.5 (GLOVE) ×6 IMPLANT
GLOVE BIO SURGEON STRL SZ7.5 (GLOVE) ×6 IMPLANT
GLOVE BIO SURGEONS STRL SZ 6.5 (GLOVE) ×3
GLOVE BIOGEL PI IND STRL 6.5 (GLOVE) ×3 IMPLANT
GLOVE BIOGEL PI INDICATOR 6.5 (GLOVE) ×6
GOWN STRL REUS W/ TWL LRG LVL3 (GOWN DISPOSABLE) ×3 IMPLANT
GOWN STRL REUS W/TWL LRG LVL3 (GOWN DISPOSABLE) ×6
KIT BASIN OR (CUSTOM PROCEDURE TRAY) ×3 IMPLANT
KIT PLEURX DRAIN CATH 1000ML (MISCELLANEOUS) ×6 IMPLANT
KIT PLEURX DRAIN CATH 15.5FR (DRAIN) ×3 IMPLANT
KIT ROOM TURNOVER OR (KITS) ×3 IMPLANT
NEEDLE HYPO 25GX1X1/2 BEV (NEEDLE) ×3 IMPLANT
NS IRRIG 1000ML POUR BTL (IV SOLUTION) ×3 IMPLANT
PACK GENERAL/GYN (CUSTOM PROCEDURE TRAY) ×3 IMPLANT
PAD ARMBOARD 7.5X6 YLW CONV (MISCELLANEOUS) ×6 IMPLANT
SET DRAINAGE LINE (MISCELLANEOUS) IMPLANT
SUT ETHILON 3 0 PS 1 (SUTURE) ×3 IMPLANT
SUT SILK 2 0 SH (SUTURE) ×3 IMPLANT
SUT VIC AB 3-0 SH 8-18 (SUTURE) ×3 IMPLANT
SYR CONTROL 10ML LL (SYRINGE) ×3 IMPLANT
TOWEL OR 17X24 6PK STRL BLUE (TOWEL DISPOSABLE) ×3 IMPLANT
TOWEL OR 17X26 10 PK STRL BLUE (TOWEL DISPOSABLE) ×3 IMPLANT
VALVE REPLACEMENT CAP (MISCELLANEOUS) IMPLANT
WATER STERILE IRR 1000ML POUR (IV SOLUTION) ×3 IMPLANT

## 2016-07-20 NOTE — Anesthesia Preprocedure Evaluation (Signed)
Anesthesia Evaluation  Patient identified by MRN, date of birth, ID band Patient awake    Reviewed: Allergy & Precautions, H&P , NPO status , Patient's Chart, lab work & pertinent test results  History of Anesthesia Complications Negative for: history of anesthetic complications  Airway Mallampati: II  TM Distance: >3 FB Neck ROM: Full    Dental  (+) Poor Dentition, Dental Advisory Given   Pulmonary COPD, former smoker,    Pulmonary exam normal breath sounds clear to auscultation       Cardiovascular hypertension, + CAD and +CHF   Rhythm:Regular Rate:Normal     Neuro/Psych negative neurological ROS  negative psych ROS   GI/Hepatic negative GI ROS, Neg liver ROS,   Endo/Other  negative endocrine ROSdiabetes  Renal/GU Renal diseaseS/P Renal Tx     Musculoskeletal   Abdominal   Peds  Hematology   Anesthesia Other Findings   Reproductive/Obstetrics                            Anesthesia Physical  Anesthesia Plan  ASA: III  Anesthesia Plan: MAC   Post-op Pain Management:    Induction: Intravenous  Airway Management Planned: Simple Face Mask  Additional Equipment:   Intra-op Plan: Delibrate Circulatory arrest per surgeon request  Post-operative Plan:   Informed Consent: I have reviewed the patients History and Physical, chart, labs and discussed the procedure including the risks, benefits and alternatives for the proposed anesthesia with the patient or authorized representative who has indicated his/her understanding and acceptance.   Dental advisory given  Plan Discussed with: CRNA  Anesthesia Plan Comments:        Anesthesia Quick Evaluation

## 2016-07-20 NOTE — Consult Note (Signed)
   [facility]   07/20/2016  [name] 01/06/1932 [id number]    [facility]. She is currently off the floor. Will follow [facility] at later time.    Raiford NobleAtika Hall, MSN-Ed, RN,BSN [facility] Hospital Liaison [phone]

## 2016-07-20 NOTE — Progress Notes (Signed)
Subjective: Currently, the patient is feeling well w/ good pain control. Still w/ significant DOE, SOB at rest at baseline w/ [place] O2.  Objective: Vital signs in last 24 hours: Vitals:   07/19/16 0622 07/19/16 1058 07/19/16 2204 07/20/16 0621  BP: (!) 115/46 (!) 110/42 (!) 111/51 (!) 118/56  Pulse: (!) 48 (!) 44 (!) 48 (!) 56  Resp: 16 16 20 17   Temp: 97.6 F (36.4 C)  97.5 F (36.4 C) 97.4 F (36.3 C)  TempSrc: Oral  Oral Oral  SpO2: 99% 100% 98% 97%  Weight:      Height:       Physical Exam: Physical Exam  Constitutional: She appears well-developed. She is cooperative. No distress.  Cardiovascular: Normal rate, regular rhythm, normal heart sounds and normal pulses.  Exam reveals no gallop.   No murmur heard. Pulmonary/Chest: Effort normal. No respiratory distress. She has decreased breath sounds in the right lower field. She has no wheezes. She has no rhonchi. She has no rales. Breasts are symmetrical.  Abdominal: Soft. Bowel sounds are normal. There is no tenderness.  Musculoskeletal: She exhibits no edema.   Labs: CBC:  Recent Labs Lab 07/18/16 1458  WBC 6.5  NEUTROABS 4.4  HGB 12.3  HCT 38.0  MCV 92.2  PLT 223   Metabolic Panel:  Recent Labs Lab 07/18/16 1458 07/20/16 0641  NA 140  --   K 4.1  --   CL 105  --   CO2 27  --   GLUCOSE 110*  --   BUN 20  --   CREATININE 1.73*  --   CALCIUM 10.1  --   LABPROT  --  18.4*  INR  --  1.51   Cardiac Labs:  Recent Labs Lab 07/18/16 1458 07/18/16 1518 07/18/16 2040 07/19/16 0229  TROPIPOC  --  0.14*  --   --   TROPONINI 0.11*  --  0.12* 0.17*   BG: Lab Results  Component Value Date   HGBA1C 6.9 (H) 12/18/2015    Medications: Scheduled Medications: . allopurinol  100 mg Oral Daily  . amiodarone  200 mg Oral BID  . amoxicillin  500 mg Oral TID  . aspirin EC  81 mg Oral Daily  . Chlorhexidine Gluconate Cloth  6 each Topical Q0600  . famotidine  20 mg Oral BID  . feeding supplement (ENSURE  ENLIVE)  237 mL Oral BID BM  . mupirocin ointment  1 application Nasal BID  . mycophenolate  500 mg Oral BID  . simvastatin  20 mg Oral QPM  . tacrolimus  2.5 mg Oral Daily   And  . tacrolimus  2 mg Oral QHS  . traMADol  100 mg Oral Q12H   PRN Medications: oxyCODONE-acetaminophen  Assessment/Plan: [name] is a 82 y.o. female with HFpEF, CAD, PAF w/ RVR, s/p renal tx now CKD II,  h/o PNA w/ recurrent bilateral effusions, who presents after fall from standing.  1) Fall: Appears mechanical. R wrist fx, will manage pain w/ tramodol scheduled and percocet PRN. Osteoporotic fx w/ CKD. PTH elevated at 87. Vit D pending. - 100mg  tramadol q12h - 5-325mg  Percocet q4h PRN breakthrough  2) SOB: Pt was admitted in May 2017 for PNA and found to have parapneumonic effusions which were drained. Since this time she has been on O2 therapy 24/7 at home with occasional exacerbations of DOE and SOB which have been found 2/2 recurrent effusions (initially exudative, now transudate). She is followed by Dr.  Wert and has has multiple thoracenteses. Most recent note from 11/28 suggests possible referal to CVTS for VATS. Clinically euvolemic w/o increase in home O2 requirement, though w/ h/o diastolic HF w/ c/o worsening orthopnea (now 4 pillow from 2 pillow baseline) and PND (~7x nightly). WBC wnl w/o complaint of cough or URI symptoms. 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 CXR w/ worsening of bilateral effusions R>>L and possible infiltrate vs atelectasis L mid lung. - CVTS to place Pleurx today - restart eliquis per CVTS after Pleurx - continue home O2, 2L via   3) CAD: Pt w/ h/o CAD s/p PCI in remote past. Trop mildly elevated on admission appears to have baseline elevation on prior admissions. No indications of ischemia w/ unchanged EKG. Continue home medications simvastain 20mg  qHS, ASA 81mg  qD.  4) PAF: Pt has h/o Afib w/ RVR most recent exacerbation in May 2017 w/  hospitalization for PNA. Is currently on rate control w/ amiodarone 200mg  BID, metoprolol 25mg  BID. AC on Eliquis 2.5mg  BID. Hold metop for brady. Hold Eliquis for pleural catheter, restart after.  5) HTN: BPs stable at present. Will hold home amlodipine 10mg  qD and furosemide 80mg  BID for soft BPs. Will recommend f/u w/ PCP this to reassess BP and restart if needed.  6) CKDII: SCr 1.7 (b/l around 1.4-1.8). S/p renal tx on tacrolimus 2.5-2mg  BID, mycophenolate 500mg  BID, furosemide 80mg  BID. Holding furosemide as above.  Length of Stay: 2 day(s) Dispo: Anticipated discharge 1 days pending pleural catheter procedure.  Carolynn CommentBryan Marjorie Deprey, MD Pager: [phone] (7AM-5PM) 07/20/2016, 7:51 AM

## 2016-07-20 NOTE — Progress Notes (Addendum)
Dr. Servando Snare notified patient has moderate bloody drainage around pleurx drain site that was placed today. No orders present to change or reinforce. Patient normally on Eliquis and was only held for one day prior to surgery. Md states to change dressing with pleurx drain dressing kit. No further orders given.

## 2016-07-20 NOTE — Anesthesia Procedure Notes (Signed)
Procedure Name: MAC Date/Time: 07/20/2016 2:06 PM Performed by: Rosiland OzMEYERS, Kandice Schmelter Pre-anesthesia Checklist: Patient identified, Emergency Drugs available, Suction available, Timeout performed and Patient being monitored Patient Re-evaluated:Patient Re-evaluated prior to inductionOxygen Delivery Method: Simple face mask

## 2016-07-20 NOTE — Progress Notes (Signed)
Internal Medicine Attending:   I saw and examined the patient. I reviewed Dr Alm BustardStrelow's note and I agree with the resident's findings and plan as documented in the resident's note. On exam today she reports her pain his much better controlled.  She remains on 2L of supplemental O2 via [place].  She has deminshed breath sounds at the right base, no wheezing bilaterally.  She has bilateral lower extremity edema. She has been evaluated by CVTS who will proceed with pleurex catheter placement.  She will likely be discharged tomorrow.

## 2016-07-20 NOTE — Transfer of Care (Signed)
Immediate Anesthesia Transfer of Care Note  Patient: [name]  Procedure(s) Performed: Procedure(s): INSERTION RIGHT PLEURAL DRAINAGE CATHETER (Right)  Patient Location: PACU  Anesthesia Type:MAC  Level of Consciousness: awake and patient cooperative  Airway & Oxygen Therapy: Patient Spontanous Breathing  Post-op Assessment: Report given to RN and Post -op Vital signs reviewed and stable  Post vital signs: Reviewed and stable  Last Vitals:  Vitals:   07/19/16 2204 07/20/16 0621  BP: (!) 111/51 (!) 118/56  Pulse: (!) 48 (!) 56  Resp: 20 17  Temp: 36.4 C 36.3 C    Last Pain:  Vitals:   07/20/16 0640  TempSrc:   PainSc: Asleep         Complications: No apparent anesthesia complications

## 2016-07-20 NOTE — Anesthesia Postprocedure Evaluation (Signed)
Anesthesia Post Note  Patient: [name]  Procedure(s) Performed: Procedure(s) (LRB): INSERTION RIGHT PLEURAL DRAINAGE CATHETER (Right)  Patient location during evaluation: PACU Anesthesia Type: MAC Level of consciousness: awake and alert Pain management: pain level controlled Vital Signs Assessment: post-procedure vital signs reviewed and stable Respiratory status: spontaneous breathing Cardiovascular status: stable Anesthetic complications: no       Last Vitals:  Vitals:   07/20/16 1453 07/20/16 1514  BP:    Pulse: 64 64  Resp: 10 (!) 23  Temp:      Last Pain:  Vitals:   07/20/16 1514  TempSrc:   PainSc: 0-No pain                 Lewie LoronJohn Viktorya Arguijo

## 2016-07-20 NOTE — Progress Notes (Signed)
The patient was examined and preop studies reviewed. There has been no change from the prior exam and the patient is ready for surgery. Plan Right Pleurx catheter on M [place]

## 2016-07-20 NOTE — Brief Op Note (Addendum)
07/18/2016 - 07/20/2016  2:44 PM  PATIENT:  [name]  82 y.o. female  PRE-OPERATIVE DIAGNOSIS:  RIGHT PLEURAL EFFUSION  POST-OPERATIVE DIAGNOSIS:  right pleural effusion  PROCEDURE:  Procedure(s): INSERTION RIGHT PLEURAL DRAINAGE CATHETER (Right) Drainage of 1.1 L pLEURAL FLUID  SURGEON:  Surgeon(s) and Role:    * Kerin PernaPeter Van Trigt, MD - Primary  PHYSICIAN ASSISTANT: none  ASSISTANTS: none   ANESTHESIA:   local, IV sedation and MAC  EBL:  Total I/O In: 300 [I.V.:300] Out: 10 [Blood:10]  BLOOD ADMINISTERED:none  DRAINS: none   LOCAL MEDICATIONS USED:  LIDOCAINE  and Amount: 10 ml  SPECIMEN:  No Specimen  DISPOSITION OF SPECIMEN:  N/A  COUNTS:  YES  TOURNIQUET:  * No tourniquets in log *  DICTATION: .Dragon Dictation  PLAN OF CARE: return to 5 N bed    Patient can be discharged tomorrow for outpatient followup of R pleurx catheter  PATIENT DISPOSITION:  return to room 5 N   Delay start of Pharmacological VTE agent (>24hrs) due to surgical blood loss or risk of bleeding: yes No Eliquis until 12-21

## 2016-07-21 ENCOUNTER — Encounter (HOSPITAL_COMMUNITY): Payer: Self-pay | Admitting: Cardiothoracic Surgery

## 2016-07-21 ENCOUNTER — Inpatient Hospital Stay (HOSPITAL_COMMUNITY): Payer: Medicare Other

## 2016-07-21 DIAGNOSIS — N2581 Secondary hyperparathyroidism of renal origin: Secondary | ICD-10-CM | POA: Diagnosis present

## 2016-07-21 DIAGNOSIS — E559 Vitamin D deficiency, unspecified: Secondary | ICD-10-CM | POA: Diagnosis present

## 2016-07-21 LAB — CBC
HCT: 37.8 % (ref 36.0–46.0)
Hemoglobin: 12.4 g/dL (ref 12.0–15.0)
MCH: 32 pg (ref 26.0–34.0)
MCHC: 32.8 g/dL (ref 30.0–36.0)
MCV: 97.4 fL (ref 78.0–100.0)
PLATELETS: 402 10*3/uL — AB (ref 150–400)
RBC: 3.88 MIL/uL (ref 3.87–5.11)
RDW: 15.9 % — AB (ref 11.5–15.5)
WBC: 11.7 10*3/uL — AB (ref 4.0–10.5)

## 2016-07-21 LAB — VITAMIN D 25 HYDROXY (VIT D DEFICIENCY, FRACTURES): Vit D, 25-Hydroxy: 15.7 ng/mL — ABNORMAL LOW (ref 30.0–100.0)

## 2016-07-21 MED ORDER — LIDOCAINE HCL (PF) 1 % IJ SOLN
INTRAMUSCULAR | Status: AC
  Administered 2016-07-21: 11:00:00
  Filled 2016-07-21: qty 5

## 2016-07-21 MED ORDER — APIXABAN 2.5 MG PO TABS: 2.5000 mg | ORAL_TABLET | Freq: Two times a day (BID) | ORAL | 3 refills | Status: AC

## 2016-07-21 MED ORDER — VITAMIN D (ERGOCALCIFEROL) 1.25 MG (50000 UNIT) PO CAPS: 50000.0000 [IU] | ORAL_CAPSULE | ORAL | 0 refills | Status: DC

## 2016-07-21 MED ORDER — MUPIROCIN 2 % EX OINT: 1.0000 "application " | TOPICAL_OINTMENT | Freq: Two times a day (BID) | CUTANEOUS | 0 refills | Status: DC

## 2016-07-21 MED ORDER — VITAMIN D (CHOLECALCIFEROL) 25 MCG (1000 UT) PO CAPS: 1000.0000 [IU] | ORAL_CAPSULE | Freq: Every day | ORAL | 0 refills | Status: DC

## 2016-07-21 NOTE — Progress Notes (Signed)
Dr. Obie DredgeBlum notified patient had surgery today and did not eat much. Patient just took pain medication and vomited. No prn medications ordered. N/V medication requested. Md states will review Qtc/rhythm and place order.

## 2016-07-21 NOTE — Op Note (Signed)
NAME:  [name], Joy                       ACCOUNT NO.:  MEDICAL RECORD NO.:  [id number]  LOCATION:                                 FACILITY:  PHYSICIAN:  Kerin PernaPeter Van Trigt, [name]  DATE OF BIRTH:  08-Apr-1932  DATE OF PROCEDURE:  07/20/2016 DATE OF DISCHARGE:                              OPERATIVE REPORT   OPERATION:  Placement of right PleurX catheter.  SURGEON:  Kerin PernaPeter Van Trigt, [name]  PREOPERATIVE DIAGNOSIS:  Recurrent nonmalignant right pleural effusion.  POSTOPERATIVE DIAGNOSIS:  Recurrent nonmalignant right pleural effusion.  ANESTHESIA:  MAC with local 1% lidocaine.  OPERATIVE PROCEDURE:  The patient was brought from the ward to the preop holding area where she was examined, marked, and informed consent was obtained.  She was brought back to the operating room and placed supine on the operating table where the right side was bumped under a rolled blanket.  The right chest was prepped and draped as a sterile field.  A proper time-out was performed.  Lidocaine 1% was infiltrated in 2 areas.  First, at the fifth interspace in the anterior axillary line, lidocaine was infiltrated.  Then, at the costal margin in the midclavicular line further lidocaine was infiltrated.  Two incisions were made in these areas.  Through the upper incision, the right pleural space was entered using a guidewire using the Seldinger technique.  This was confirmed with C-arm fluoro.  Next, this incision was made in the lower area that was anesthetized.  Through this, a PleurX catheter was tunneled from the lower to the upper incision.  Over the guidewire in the upper incision, a dilator, then dilator sheath was inserted in the pleural space.  Clear fluid drained out.  The PleurX catheter was then advanced through the tear-away sheath into the pleural space where it drained 1.1 L.  The 2 incisions were closed in standard fashion.  The catheter was capped and dressed in a sterile technique.  A followup  chest x-ray in the recovery room showed resolution of the pleural effusion and the PleurX catheter in good position without pneumothorax.     Kerin PernaPeter Van Trigt, [name]     PV/MEDQ  D:  07/20/2016  T:  07/21/2016  Job:  [id number]

## 2016-07-21 NOTE — Progress Notes (Signed)
Internal Medicine Attending:   I saw and examined the patient. I reviewed the resident's note and I agree with the resident's findings and plan as documented in the resident's note. [name] is doing well on exam today, she has had the plurex catheter placed, she did have some bleeding from this site this morning and Dr Donata ClayVan Trigt added additional stiches.  1.1 L has been removed after the catheter was placed. She is currently stable on her home 2 L of supplemental O2 for her chronic respiratory failure with hypoxia.  Additional issues: I would classify her fracture as a osteoporotic fracture.  We have found that she has secondary hyperparathyroidism, mild hypercalcemia, and vitamin D deficiency in our workup of this.  We will start her on Vitamin D3 1000units daily for this issue. She will need repeats of Vitamin D, Calcium (BMP or renal function panel), PTH, and phosphorous in about 6-8 weeks.

## 2016-07-21 NOTE — Care Management Note (Signed)
Case Management Note  Patient Details  Name: [name] MRN: [id number] Date of Birth: 04/09/1932  Subjective/Objective: 82 yr old female s/p fall with right wrist fracture. 07/20/16 had Placement of right PleurX catheter.            Action/Plan: Case manager spoke with patient concerning need for Home Health RN and aide. Patient states she has used [facility] in the past and will do so now. Case manager called referral to Janeice RobinsonKaren Nusbaumm, [facility]. Patient states she will have family checking in on her also.   Expected Discharge Date:    07/21/16              Expected Discharge Plan:  Home w Home Health Services  In-House Referral:  NA  Discharge planning Services  CM Consult  Post Acute Care Choice:  Home Health Choice offered to:  Patient  DME Arranged:  N/A DME Agency:     HH Arranged:  RN, Nurse's Aide HH Agency:  [facility]  Status of Service:  Completed, signed off  If discussed at Long Length of Stay Meetings, dates discussed:    Additional Comments:  Durenda GuthrieBrady, Chas Axel Naomi, RN 07/21/2016, 1:34 PM

## 2016-07-21 NOTE — Progress Notes (Signed)
Subjective: Currently, the patient is doing well s/p pleurx catheter yesterday. No SOB ON. Some nausea tx w/ zofran. Pain in wrist well controlled. Catheter w/ some bloody discharge [facility], continuing to hold for 1 additional day.  Objective: Vital signs in last 24 hours: Vitals:   07/20/16 1530 07/20/16 1553 07/20/16 2016 07/21/16 0500  BP:   (!) 128/53 (!) 120/52  Pulse: 66 65 67 66  Resp: 15 16 15 16   Temp:  97 F (36.1 C)  98.7 F (37.1 C)  TempSrc:    Oral  SpO2: 97% 100% 94% 97%  Weight:      Height:       Physical Exam: Physical Exam  Constitutional: She appears well-developed. She is cooperative. No distress.  Cardiovascular: Normal rate, regular rhythm, normal heart sounds and normal pulses.  Exam reveals no gallop.   No murmur heard. Pulmonary/Chest: Effort normal. No respiratory distress. She has decreased breath sounds in the right lower field. She has no wheezes. She has no rhonchi. She has no rales. Breasts are symmetrical.  Abdominal: Soft. Bowel sounds are normal. There is no tenderness.  Musculoskeletal: She exhibits no edema.   Labs: CBC:  Recent Labs Lab 07/18/16 1458  WBC 6.5  NEUTROABS 4.4  HGB 12.3  HCT 38.0  MCV 92.2  PLT 223   Metabolic Panel:  Recent Labs Lab 07/18/16 1458 07/20/16 0641  NA 140  --   K 4.1  --   CL 105  --   CO2 27  --   GLUCOSE 110*  --   BUN 20  --   CREATININE 1.73*  --   CALCIUM 10.1  --   LABPROT  --  18.4*  INR  --  1.51   Cardiac Labs:  Recent Labs Lab 07/18/16 1458 07/18/16 1518 07/18/16 2040 07/19/16 0229  TROPIPOC  --  0.14*  --   --   TROPONINI 0.11*  --  0.12* 0.17*   BG: Lab Results  Component Value Date   HGBA1C 6.9 (H) 12/18/2015    Medications: Scheduled Medications: . allopurinol  100 mg Oral Daily  . amiodarone  200 mg Oral BID  . amoxicillin  500 mg Oral TID  . aspirin EC  81 mg Oral Daily  . Chlorhexidine Gluconate Cloth  6 each Topical Q0600  .  famotidine  20 mg Oral Daily  . feeding supplement (ENSURE ENLIVE)  237 mL Oral BID BM  . mupirocin ointment  1 application Nasal BID  . mycophenolate  500 mg Oral BID  . simvastatin  20 mg Oral QPM  . tacrolimus  2.5 mg Oral Daily   And  . tacrolimus  2 mg Oral QHS  . traMADol  100 mg Oral Q12H   PRN Medications: oxyCODONE-acetaminophen  Assessment/Plan: [name] is a 82 y.o. female with HFpEF, CAD, PAF w/ RVR, s/p renal tx now CKD II,  h/o PNA w/ recurrent bilateral effusions, who presents after fall from standing.  1) Fall: Appears mechanical. R wrist fx, will manage pain w/ tramodol scheduled and percocet PRN. Osteoporotic fx w/ CKD. PTH elevated at 87. Vit D pending. - 100mg  tramadol q12h - 5-325mg  Percocet q4h PRN breakthrough  2) SOB: Pt was admitted in May 2017 for PNA and found to have parapneumonic effusions which were drained. Since this time she has been on O2 therapy 24/7 at home with occasional exacerbations of DOE and SOB which have been found 2/2 recurrent effusions (initially  exudative, now transudate). She is followed by Dr. Sherene SiresWert and has has multiple thoracenteses. Most recent note from 11/28 suggests possible referal to CVTS for VATS. Clinically euvolemic w/o increase in home O2 requirement, though w/ h/o diastolic HF w/ c/o worsening orthopnea (now 4 pillow from 2 pillow baseline) and PND (~7x nightly). WBC wnl w/o complaint of cough or URI symptoms. 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 CXR w/ worsening of bilateral effusions R>>L and possible infiltrate vs atelectasis L mid lung. Infiltrates suspected to be atelectasis given afebrile, no cough, no leukocytosis. CVTS to f/u pleurx as outpt. - restart eliquis 12/21 - will have HH to teach catheter care - continue home O2, 2L via [place]  3) CAD: Pt w/ h/o CAD s/p PCI in remote past. Trop mildly elevated on admission appears to have baseline elevation on prior admissions. No indications of  ischemia w/ unchanged EKG. Continue home medications simvastain 20mg  qHS, ASA 81mg  qD.  4) PAF: Pt has h/o Afib w/ RVR most recent exacerbation in May 2017 w/ hospitalization for PNA. Is currently on rate control w/ amiodarone 200mg  BID, metoprolol 25mg  BID. AC on Eliquis 2.5mg  BID. Hold metop for brady. Hold Eliquis for pleural catheter, restart 12/21.  5) HTN: BPs stable at present. Will hold home amlodipine 10mg  qD and furosemide 80mg  BID for soft BPs. Will recommend f/u w/ PCP this to reassess BP and restart if needed.  6) CKDII: SCr 1.7 (b/l around 1.4-1.8). S/p renal tx on tacrolimus 2.5-2mg  BID, mycophenolate 500mg  BID, furosemide 80mg  BID. Holding furosemide as above.  Length of Stay: 3 day(s) Dispo: Anticipated discharge today.  Joy CommentBryan Ali Mclaurin, MD Pager: [phone] (7AM-5PM) 07/21/2016, 9:02 AM

## 2016-07-21 NOTE — Progress Notes (Addendum)
All dressings removed from right Pleur X site. Bloody ooze from wound above Pleur X tube. Hibiclenz used to clean skin area. 1% xylocaine used locally above and below wound (2 sutures in place from surgery). 2 silk sutures placed and trace amount of bleeding afterward. Patient tolerated the procedure well. Right Pleur X dressing placed (drain gauze) with tegaderm. 2x2s, gauze, and tape applied to previous bleeding site. Would recommend not restarting Eliquis until 48 hours. (I went into the discharge medications and specifically put Eliquis to start am of 07/23/2016 per Dr. Donata ClayVan Trigt). Nurse instructed to call for further problems. My pager is [phone].

## 2016-07-21 NOTE — Progress Notes (Addendum)
      [address]       [place] [id number]             [phone]       1 Day Post-Op Procedure(s) (LRB): INSERTION RIGHT PLEURAL DRAINAGE CATHETER (Right)  Subjective: Nurse reports fair amount of bleeding above right Pleur X catheter insertion site since returning from surgery.   Objective: Vital signs in last 24 hours: Temp:  [97 F (36.1 C)-98.7 F (37.1 C)] 98.7 F (37.1 C) (12/20 0500) Pulse Rate:  [64-67] 66 (12/20 0500) Cardiac Rhythm: Bundle branch block;Heart block (12/20 0700) Resp:  [10-23] 16 (12/20 0500) BP: (120-128)/(52-53) 120/52 (12/20 0500) SpO2:  [94 %-100 %] 97 % (12/20 0500)     Intake/Output from previous day: 12/19 0701 - 12/20 0700 In: 520 [P.O.:220; I.V.:300] Out: 10 [Blood:10]   Physical Exam:  Cardiovascular: RRR Pulmonary: Clear to auscultation on left and diminished on right Wounds: Bloody drainage from superior wound.   Lab Results: CBC: Recent Labs  07/18/16 1458  WBC 6.5  HGB 12.3  HCT 38.0  PLT 223   BMET:  Recent Labs  07/18/16 1458  NA 140  K 4.1  CL 105  CO2 27  GLUCOSE 110*  BUN 20  CREATININE 1.73*  CALCIUM 10.1    PT/INR:  Recent Labs  07/20/16 0641  LABPROT 18.4*  INR 1.51   ABG:  INR: Will add last result for INR, ABG once components are confirmed Will add last 4 CBG results once components are confirmed  Assessment/Plan:  1. CV - First degree heart block. Was given Apixaban 12/18 am but now stopped. 2.  Pulmonary - On 2 liters of oxygen via [place]. CXR this am shows Pleur X catheter on right, no pneumothorax, and infiltrates RUL/RLL. 3. 2 silk sutures placed for bleeding (above right Pleur X catheter) 4. Management per medicine  ZIMMERMAN,DONIELLE MPA-C 07/21/2016,9:52 AM  patient examined and medical record reviewed,agree with above note. Kathlee Nationseter Van Trigt III 07/21/2016

## 2016-07-21 NOTE — Discharge Instructions (Signed)
Mupirocin nasal ointment What is this medicine? MUPIROCIN CALCIUM (myoo PEER oh sin KAL see um) is an antibiotic. It is used inside the nose to treat infections that are caused by certain bacteria. This helps prevent the spread of infection to patients and health care workers during outbreaks at institutions. This medicine may be used for other purposes; ask your health care provider or pharmacist if you have questions. COMMON BRAND NAME(S): Bactroban What should I tell my health care provider before I take this medicine? They need to know if you have any of these conditions: -an unusual or allergic reaction to mupirocin, other medicines, foods, dyes, or preservatives -pregnant or trying to get pregnant -breast-feeding How should I use this medicine? This medicine is only for use inside the nose. Follow the directions on the prescription label. Wash your hands before and after use. Squeeze half the contents of a single-use tube into one nostril, then squeeze the other half into the other nostril. Press the sides of your nose together and gently massage after application to spread the ointment throughout the nostrils. Do not use your medicine more often than directed. Finish the full course of medicine prescribed by your doctor or health care professional even if you think your condition is better. Talk to your pediatrician regarding the use of this medicine in children. Special care may be needed. Overdosage: If you think you have taken too much of this medicine contact a poison control center or emergency room at once. NOTE: This medicine is only for you. Do not share this medicine with others. What if I miss a dose? If you miss a dose, take it as soon as you can. If it is almost time for your next dose, take only that dose. Do not take double or extra doses. What may interact with this medicine? Interactions are not expected. Do not use any other nose products without telling your doctor or  health care professional. This list may not describe all possible interactions. Give your health care provider a list of all the medicines, herbs, non-prescription drugs, or dietary supplements you use. Also tell them if you smoke, drink alcohol, or use illegal drugs. Some items may interact with your medicine. What should I watch for while using this medicine? If your nose is severely irritated, burning or stinging from use of this medicine, stop using it and contact your doctor or health care professional. Do not get this medicine in your eyes. If you do, rinse out with plenty of cool tap water. What side effects may I notice from receiving this medicine? Side effects that you should report to your doctor or health care professional as soon as possible: -severe irritation, burning, stinging, or pain Side effects that usually do not require medical attention (report to your doctor or health care professional if they continue or are bothersome): -altered taste -cough -headache -skin itching -sore throat -stuffy or runny nose This list may not describe all possible side effects. Call your doctor for medical advice about side effects. You may report side effects to FDA at 1-800-FDA-1088. Where should I keep my medicine? Keep out of the reach of children. Store at room temperature between 15 and 30 degrees C (59 and 86 degrees F). Do not refrigerate. One tube of ointment is for single use in both nostrils. Throw away after use. NOTE: This sheet is a summary. It may not cover all possible information. If you have questions about this medicine, talk to your doctor, pharmacist, or  health care provider.  2017 Elsevier/Gold Standard (2008-02-05 14:36:10) Acetaminophen; Oxycodone tablets, extended-release What is this medicine? ACETAMINOPHEN; OXYCODONE (a set a MEE noe fen; ox i KOE done) is a pain reliever. It is used to treat moderate to severe pain. COMMON BRAND NAME(S): XARTEMIS XR What should I  tell my health care provider before I take this medicine? They need to know if you have any of these conditions: -brain tumor -Crohn's disease, inflammatory bowel disease, or ulcerative colitis -drink more than 3 alcohol containing drinks per day -drug abuse or addiction -head injury -heart or circulation problems -kidney disease or problems going to the bathroom -liver disease -lung disease, asthma, or breathing problems -an unusual or allergic reaction to acetaminophen, oxycodone, other opioid analgesics, other medicines, foods, dyes, or preservatives -pregnant or trying to get pregnant -breast-feeding How should I use this medicine? Take this medicine by mouth with a full glass of water. Follow the directions on the prescription label. Do not cut, crush, or chew this medicine. You can take it with or without food. If it upsets your stomach, take it with food. Take your medicine at regular intervals. Do not take it more often than directed. Do not stop taking except on your doctor's advice. A special MedGuide will be given to you by the pharmacist with each prescription and refill. Be sure to read this information carefully each time. Talk to your pediatrician regarding the use of this medicine in children. Special care may be needed. What if I miss a dose? If you miss a dose, take it as soon as you can. If it is almost time for your next dose, take only that dose. Do not take double or extra doses. What may interact with this medicine? This medicine may interact with the following medications: -alcohol -antihistamines for allergy, cough and cold -antiviral medicines for HIV or AIDS -atropine -certain antibiotics like clarithromycin, erythromycin, linezolid, rifampin -certain medicines for anxiety or sleep -certain medicines for bladder problems like oxybutynin, tolterodine -certain medicines for depression like amitriptyline, fluoxetine, sertraline -certain medicines for fungal  infections like ketoconazole, itraconazole, voriconazole -certain medicines for migraine headache like almotriptan, eletriptan, frovatriptan, naratriptan, rizatriptan, sumatriptan, zolmitriptan -certain medicines for nausea or vomiting like dolasetron, ondansetron, palonosetron -certain medicines for Parkinson's disease like benztropine, trihexyphenidyl -certain medicines for seizures like phenobarbital, phenytoin, primidone -certain medicines for stomach problems like dicyclomine, hyoscyamine -certain medicines for travel sickness like scopolamine -diuretics -general anesthetics like halothane, isoflurane, methoxyflurane, propofol -ipratropium -local anesthetics like lidocaine, pramoxine, tetracaine -MAOIs like Carbex, Eldepryl, Marplan, Nardil, and Parnate -medicines that relax muscles for surgery -methylene blue -nilotinib -other medicines with acetaminophen -other narcotic medicines for pain or cough -phenothiazines like chlorpromazine, mesoridazine, prochlorperazine, thioridazine What should I watch for while using this medicine? Tell your doctor or health care professional if your pain does not go away, if it gets worse, or if you have new or a different type of pain. You may develop tolerance to the medicine. Tolerance means that you will need a higher dose of the medication for pain relief. Tolerance is normal and is expected if you take this medicine for a long time. Do not suddenly stop taking your medicine because you may develop a severe reaction. Your body becomes used to the medicine. This does NOT mean you are addicted. Addiction is a behavior related to getting and using a drug for a non-medical reason. If you have pain, you have a medical reason to take pain medicine. Your doctor will tell you how  much medicine to take. If your doctor wants you to stop the medicine, the dose will be slowly lowered over time to avoid any side effects. There are different types of narcotic  medicines (opiates). If you take more than one type at the same time or if you are taking another medicine that also causes drowsiness, you may have more side effects. Give your health care provider a list of all medicines you use. Your doctor will tell you how much medicine to take. Do not take more medicine than directed. Call emergency for help if you have problems breathing or unusual sleepiness. Do not take other medicines that contain acetaminophen with this medicine. Always read labels carefully. If you have questions, ask your doctor or pharmacist. If you take too much acetaminophen get medical help right away. Too much acetaminophen can be very dangerous and cause liver damage. Even if you do not have symptoms, it is important to get help right away. You may get drowsy or dizzy. Do not drive, use machinery, or do anything that needs mental alertness until you know how this medicine affects you. Do not stand or sit up quickly, especially if you are an older patient. This reduces the risk of dizzy or fainting spells. Alcohol may interfere with the effect of this medicine. Avoid alcoholic drinks. The medicine will cause constipation. Try to have a bowel movement at least every 2 to 3 days. If you do not have a bowel movement for 3 days, call your doctor or health care professional. Your mouth may get dry. Chewing sugarless gum or sucking hard candy, and drinking plenty of water may help. Contact your doctor if the problem does not go away or is severe. What side effects may I notice from receiving this medicine? Side effects that you should report to your doctor or health care professional as soon as possible: -allergic reactions like skin rash, itching or hives, swelling of the face, lips, or tongue -breathing problems -confusion -redness, blistering, peeling or loosening of the skin, including inside the mouth -signs and symptoms of liver injury like dark yellow or brown urine; general ill  feeling or flu-like symptoms; light-colored stools; loss of appetite; nausea; right upper belly pain; unusually weak or tired; yellowing of the eyes or skin -signs and symptoms of low blood pressure like dizziness; feeling faint or lightheaded, falls; unusually weak or tired -trouble passing urine or change in the amount of urine -trouble swallowing Side effects that usually do not require medical attention (report to your doctor or health care professional if they continue or are bothersome): -constipation -dry mouth -nausea, vomiting -tiredness Where should I keep my medicine? Keep out of the reach of children. This medicine can be abused. Keep your medicine in a safe place to protect it from theft. Do not share this medicine with anyone. Selling or giving away this medicine is dangerous and against the law. Store at room temperature between 15 and 30 degrees C (59 and 86 degrees F). This medicine may cause accidental overdose and death if it is taken by other adults, children, or pets. Flush any unused medicine down the toilet to reduce the chance of harm. Do not use the medicine after the expiration date.  2017 Elsevier/Gold Standard (2015-08-20 17:52:58) Calcium; Vitamin D oral tablets What is this medicine? CALCIUM; VITAMIN D (KAL see um; VYE ta min D) is a vitamin supplement. It is used to prevent conditions of low calcium and vitamin D. COMMON BRAND NAME(S): Calcarb 600 with  Vitamin D, Calcet Plus Vitamin D, Calcitrate + D, Calcium Citrate + D3 Maximum, Calcium Citrate Maximum with D, Caltrate, Caltrate 600+D, Citracal + D, Citracal MAXIMUM + D, Citracal Petites with Vitamin D, Citrus Calcium Plus D, OSCAL 500 + D, OSCAL Calcium + D3, OSCAL Extra D3, Osteo-Poretical, Oysco 500 + D, Oysco D, Oystercal-D Calcium What should I tell my health care provider before I take this medicine? They need to know if you have any of these conditions: -constipation -dehydration -heart disease -high  level of calcium or vitamin D in the blood -high level of phosphate in the blood -kidney disease -kidney stones -liver disease -parathyroid disease -sarcoidosis -stomach ulcer or obstruction -an unusual or allergic reaction to calcium, vitamin D, tartrazine dye, other medicines, foods, dyes, or preservatives -pregnant or trying to get pregnant -breast-feeding How should I use this medicine? Take this medicine by mouth with a glass of water. Follow the directions on the label. Take with food or within 1 hour after a meal. Take your medicine at regular intervals. Do not take your medicine more often than directed. Talk to your pediatrician regarding the use of this medicine in children. While this medicine may be used in children for selected conditions, precautions do apply. What if I miss a dose? If you miss a dose, take it as soon as you can. If it is almost time for your next dose, take only that dose. Do not take double or extra doses. What may interact with this medicine? Do not take this medicine with any of the following medications: -ammonium chloride -methenamine This medicine may also interact with the following medications: -antibiotics like ciprofloxacin, gatifloxacin, tetracycline -captopril -delavirdine -diuretics -gabapentin -iron supplements -medicines for fungal infections like ketoconazole and itraconazole -medicines for seizures like ethotoin and phenytoin -mineral oil -mycophenolate -other vitamins with calcium, vitamin D, or minerals -quinidine -rosuvastatin -sucralfate -thyroid medicine What should I watch for while using this medicine? Taking this medicine is not a substitute for a well-balanced diet and exercise. Talk with your doctor or health care provider and follow a healthy lifestyle. Do not take this medicine with high-fiber foods, large amounts of alcohol, or drinks containing caffeine. Do not take this medicine within 2 hours of any other  medicines. What side effects may I notice from receiving this medicine? Side effects that you should report to your doctor or health care professional as soon as possible: -allergic reactions like skin rash, itching or hives, swelling of the face, lips, or tongue -confusion -dry mouth -high blood pressure -increased hunger or thirst -increased urination -irregular heartbeat -metallic taste -muscle or bone pain -pain when urinating -seizure -unusually weak or tired -weight loss Side effects that usually do not require medical attention (report to your doctor or health care professional if they continue or are bothersome): -constipation -diarrhea -headache -loss of appetite -nausea, vomiting -stomach upset Where should I keep my medicine? Keep out of the reach of children. Store at room temperature between 15 and 30 degrees C (59 and 86 degrees F). Protect from light. Keep container tightly closed. Throw away any unused medicine after the expiration date.  2017 Elsevier/Gold Standard (2007-11-01 17:56:23) Tramadol tablets What is this medicine? TRAMADOL (TRA ma dole) is a pain reliever. It is used to treat moderate to severe pain in adults. This medicine may be used for other purposes; ask your health care provider or pharmacist if you have questions. COMMON BRAND NAME(S): Ultram What should I tell my health care provider before I  take this medicine? They need to know if you have any of these conditions: -brain tumor -depression -drug abuse or addiction -head injury -if you frequently drink alcohol containing drinks -kidney disease or trouble passing urine -liver disease -lung disease, asthma, or breathing problems -seizures or epilepsy -suicidal thoughts, plans, or attempt; a previous suicide attempt by you or a family member -an unusual or allergic reaction to tramadol, codeine, other medicines, foods, dyes, or preservatives -pregnant or trying to get  pregnant -breast-feeding How should I use this medicine? Take this medicine by mouth with a full glass of water. Follow the directions on the prescription label. You can take it with or without food. If it upsets your stomach, take it with food. Do not take your medicine more often than directed. A special MedGuide will be given to you by the pharmacist with each prescription and refill. Be sure to read this information carefully each time. Talk to your pediatrician regarding the use of this medicine in children. Special care may be needed. Overdosage: If you think you have taken too much of this medicine contact a poison control center or emergency room at once. NOTE: This medicine is only for you. Do not share this medicine with others. What if I miss a dose? If you miss a dose, take it as soon as you can. If it is almost time for your next dose, take only that dose. Do not take double or extra doses. What may interact with this medicine? Do not take this medication with any of the following medicines: -MAOIs like Carbex, Eldepryl, Marplan, Nardil, and Parnate This medicine may also interact with the following medications: -alcohol -antihistamines for allergy, cough and cold -certain medicines for anxiety or sleep -certain medicines for depression like amitriptyline, fluoxetine, sertraline -certain medicines for migraine headache like almotriptan, eletriptan, frovatriptan, naratriptan, rizatriptan, sumatriptan, zolmitriptan -certain medicines for seizures like carbamazepine, oxcarbazepine, phenobarbital, primidone -certain medicines that treat or prevent blood clots like warfarin -digoxin -furazolidone -general anesthetics like halothane, isoflurane, methoxyflurane, propofol -linezolid -local anesthetics like lidocaine, pramoxine, tetracaine -medicines that relax muscles for surgery -other narcotic medicines for pain or cough -phenothiazines like chlorpromazine, mesoridazine,  prochlorperazine, thioridazine -procarbazine This list may not describe all possible interactions. Give your health care provider a list of all the medicines, herbs, non-prescription drugs, or dietary supplements you use. Also tell them if you smoke, drink alcohol, or use illegal drugs. Some items may interact with your medicine. What should I watch for while using this medicine? Tell your doctor or health care professional if your pain does not go away, if it gets worse, or if you have new or a different type of pain. You may develop tolerance to the medicine. Tolerance means that you will need a higher dose of the medicine for pain relief. Tolerance is normal and is expected if you take this medicine for a long time. Do not suddenly stop taking your medicine because you may develop a severe reaction. Your body becomes used to the medicine. This does NOT mean you are addicted. Addiction is a behavior related to getting and using a drug for a non-medical reason. If you have pain, you have a medical reason to take pain medicine. Your doctor will tell you how much medicine to take. If your doctor wants you to stop the medicine, the dose will be slowly lowered over time to avoid any side effects. There are different types of narcotic medicines (opiates). If you take more than one type at the  same time or if you are taking another medicine that also causes drowsiness, you may have more side effects. Give your health care provider a list of all medicines you use. Your doctor will tell you how much medicine to take. Do not take more medicine than directed. Call emergency for help if you have problems breathing or unusual sleepiness. You may get drowsy or dizzy. Do not drive, use machinery, or do anything that needs mental alertness until you know how this medicine affects you. Do not stand or sit up quickly, especially if you are an older patient. This reduces the risk of dizzy or fainting spells. Alcohol can  increase or decrease the effects of this medicine. Avoid alcoholic drinks. You may have constipation. Try to have a bowel movement at least every 2 to 3 days. If you do not have a bowel movement for 3 days, call your doctor or health care professional. Your mouth may get dry. Chewing sugarless gum or sucking hard candy, and drinking plenty of water may help. Contact your doctor if the problem does not go away or is severe. What side effects may I notice from receiving this medicine? Side effects that you should report to your doctor or health care professional as soon as possible: -allergic reactions like skin rash, itching or hives, swelling of the face, lips, or tongue -breathing problems -confusion -seizures -signs and symptoms of low blood pressure like dizziness; feeling faint or lightheaded, falls; unusually weak or tired -trouble passing urine or change in the amount of urine Side effects that usually do not require medical attention (report to your doctor or health care professional if they continue or are bothersome): -constipation -dry mouth -nausea, vomiting -tiredness This list may not describe all possible side effects. Call your doctor for medical advice about side effects. You may report side effects to FDA at 1-800-FDA-1088. Where should I keep my medicine? Keep out of the reach of children. This medicine may cause accidental overdose and death if it taken by other adults, children, or pets. Mix any unused medicine with a substance like cat litter or coffee grounds. Then throw the medicine away in a sealed container like a sealed bag or a coffee can with a lid. Do not use the medicine after the expiration date. Store at room temperature between 15 and 30 degrees C (59 and 86 degrees F). NOTE: This sheet is a summary. It may not cover all possible information. If you have questions about this medicine, talk to your doctor, pharmacist, or health care provider.  2017 Elsevier/Gold  Standard (2015-04-13 09:00:04) Home health: please drain right Pleur X catheter every other day and record output.

## 2016-07-21 NOTE — Progress Notes (Addendum)
RN changed dressing this morning, patient had bled through re-enforced dressing as well as saturated the guaze that had been changed by night nurse. MD Strelow came in at time dressing had just been changed, Rn showed MD dressing in trash and soiled sheets to show amount of blood.  RN just re-paged MD because new dressing is already saturated in blood again, as well as bed pan saturated with blood. MD said he would come assess. RN to leave dressing so MD can see, RN following up with Lab to see when CBC will be drawn.

## 2016-07-22 ENCOUNTER — Telehealth: Payer: Self-pay | Admitting: Internal Medicine

## 2016-07-22 DIAGNOSIS — W19XXXD Unspecified fall, subsequent encounter: Secondary | ICD-10-CM | POA: Diagnosis not present

## 2016-07-22 DIAGNOSIS — I110 Hypertensive heart disease with heart failure: Secondary | ICD-10-CM | POA: Diagnosis not present

## 2016-07-22 DIAGNOSIS — J90 Pleural effusion, not elsewhere classified: Secondary | ICD-10-CM | POA: Diagnosis not present

## 2016-07-22 DIAGNOSIS — J9611 Chronic respiratory failure with hypoxia: Secondary | ICD-10-CM | POA: Diagnosis not present

## 2016-07-22 DIAGNOSIS — Z4682 Encounter for fitting and adjustment of non-vascular catheter: Secondary | ICD-10-CM | POA: Diagnosis not present

## 2016-07-22 DIAGNOSIS — Z940 Kidney transplant status: Secondary | ICD-10-CM | POA: Diagnosis not present

## 2016-07-22 DIAGNOSIS — J449 Chronic obstructive pulmonary disease, unspecified: Secondary | ICD-10-CM | POA: Diagnosis not present

## 2016-07-22 DIAGNOSIS — Z7982 Long term (current) use of aspirin: Secondary | ICD-10-CM | POA: Diagnosis not present

## 2016-07-22 DIAGNOSIS — I5032 Chronic diastolic (congestive) heart failure: Secondary | ICD-10-CM | POA: Diagnosis not present

## 2016-07-22 DIAGNOSIS — S6291XD Unspecified fracture of right wrist and hand, subsequent encounter for fracture with routine healing: Secondary | ICD-10-CM | POA: Diagnosis not present

## 2016-07-22 DIAGNOSIS — I2510 Atherosclerotic heart disease of native coronary artery without angina pectoris: Secondary | ICD-10-CM | POA: Diagnosis not present

## 2016-07-22 NOTE — Telephone Encounter (Signed)
APT. REMINDER CALL, LMTCB °

## 2016-07-23 ENCOUNTER — Ambulatory Visit: Payer: Medicare Other | Admitting: Pharmacist

## 2016-07-23 ENCOUNTER — Ambulatory Visit: Payer: Medicare Other | Admitting: Internal Medicine

## 2016-07-23 ENCOUNTER — Encounter: Payer: Self-pay | Admitting: Internal Medicine

## 2016-07-23 LAB — TYPE AND SCREEN
Blood Product Expiration Date: 201712272359
Blood Product Expiration Date: 201712272359
Unit Type and Rh: 5100
Unit Type and Rh: 5100

## 2016-07-24 DIAGNOSIS — I110 Hypertensive heart disease with heart failure: Secondary | ICD-10-CM | POA: Diagnosis not present

## 2016-07-24 DIAGNOSIS — I5032 Chronic diastolic (congestive) heart failure: Secondary | ICD-10-CM | POA: Diagnosis not present

## 2016-07-24 DIAGNOSIS — S6291XD Unspecified fracture of right wrist and hand, subsequent encounter for fracture with routine healing: Secondary | ICD-10-CM | POA: Diagnosis not present

## 2016-07-24 DIAGNOSIS — J90 Pleural effusion, not elsewhere classified: Secondary | ICD-10-CM | POA: Diagnosis not present

## 2016-07-24 DIAGNOSIS — I2510 Atherosclerotic heart disease of native coronary artery without angina pectoris: Secondary | ICD-10-CM | POA: Diagnosis not present

## 2016-07-24 DIAGNOSIS — Z4682 Encounter for fitting and adjustment of non-vascular catheter: Secondary | ICD-10-CM | POA: Diagnosis not present

## 2016-07-26 DIAGNOSIS — S6291XD Unspecified fracture of right wrist and hand, subsequent encounter for fracture with routine healing: Secondary | ICD-10-CM | POA: Diagnosis not present

## 2016-07-26 DIAGNOSIS — Z4682 Encounter for fitting and adjustment of non-vascular catheter: Secondary | ICD-10-CM | POA: Diagnosis not present

## 2016-07-26 DIAGNOSIS — J90 Pleural effusion, not elsewhere classified: Secondary | ICD-10-CM | POA: Diagnosis not present

## 2016-07-26 DIAGNOSIS — I5032 Chronic diastolic (congestive) heart failure: Secondary | ICD-10-CM | POA: Diagnosis not present

## 2016-07-26 DIAGNOSIS — I2510 Atherosclerotic heart disease of native coronary artery without angina pectoris: Secondary | ICD-10-CM | POA: Diagnosis not present

## 2016-07-26 DIAGNOSIS — I110 Hypertensive heart disease with heart failure: Secondary | ICD-10-CM | POA: Diagnosis not present

## 2016-07-28 ENCOUNTER — Telehealth: Payer: Self-pay | Admitting: *Deleted

## 2016-07-28 DIAGNOSIS — Z4682 Encounter for fitting and adjustment of non-vascular catheter: Secondary | ICD-10-CM | POA: Diagnosis not present

## 2016-07-28 DIAGNOSIS — I5032 Chronic diastolic (congestive) heart failure: Secondary | ICD-10-CM | POA: Diagnosis not present

## 2016-07-28 DIAGNOSIS — S6291XD Unspecified fracture of right wrist and hand, subsequent encounter for fracture with routine healing: Secondary | ICD-10-CM | POA: Diagnosis not present

## 2016-07-28 DIAGNOSIS — I2510 Atherosclerotic heart disease of native coronary artery without angina pectoris: Secondary | ICD-10-CM | POA: Diagnosis not present

## 2016-07-28 DIAGNOSIS — I110 Hypertensive heart disease with heart failure: Secondary | ICD-10-CM | POA: Diagnosis not present

## 2016-07-28 DIAGNOSIS — J90 Pleural effusion, not elsewhere classified: Secondary | ICD-10-CM | POA: Diagnosis not present

## 2016-07-28 NOTE — Telephone Encounter (Signed)
[facility] calls and states pt is having a lot of nausea, dry heaves, she would like to know if you could send some zofran in for pt?  [facility] ph [phone]

## 2016-07-30 DIAGNOSIS — I110 Hypertensive heart disease with heart failure: Secondary | ICD-10-CM | POA: Diagnosis not present

## 2016-07-30 DIAGNOSIS — I5032 Chronic diastolic (congestive) heart failure: Secondary | ICD-10-CM | POA: Diagnosis not present

## 2016-07-30 DIAGNOSIS — S6291XD Unspecified fracture of right wrist and hand, subsequent encounter for fracture with routine healing: Secondary | ICD-10-CM | POA: Diagnosis not present

## 2016-07-30 DIAGNOSIS — I2510 Atherosclerotic heart disease of native coronary artery without angina pectoris: Secondary | ICD-10-CM | POA: Diagnosis not present

## 2016-07-30 DIAGNOSIS — Z4682 Encounter for fitting and adjustment of non-vascular catheter: Secondary | ICD-10-CM | POA: Diagnosis not present

## 2016-07-30 DIAGNOSIS — J90 Pleural effusion, not elsewhere classified: Secondary | ICD-10-CM | POA: Diagnosis not present

## 2016-08-03 DIAGNOSIS — J90 Pleural effusion, not elsewhere classified: Secondary | ICD-10-CM | POA: Diagnosis not present

## 2016-08-03 DIAGNOSIS — S6291XD Unspecified fracture of right wrist and hand, subsequent encounter for fracture with routine healing: Secondary | ICD-10-CM | POA: Diagnosis not present

## 2016-08-03 DIAGNOSIS — I110 Hypertensive heart disease with heart failure: Secondary | ICD-10-CM | POA: Diagnosis not present

## 2016-08-03 DIAGNOSIS — I5032 Chronic diastolic (congestive) heart failure: Secondary | ICD-10-CM | POA: Diagnosis not present

## 2016-08-03 DIAGNOSIS — Z4682 Encounter for fitting and adjustment of non-vascular catheter: Secondary | ICD-10-CM | POA: Diagnosis not present

## 2016-08-03 DIAGNOSIS — I2510 Atherosclerotic heart disease of native coronary artery without angina pectoris: Secondary | ICD-10-CM | POA: Diagnosis not present

## 2016-08-03 NOTE — Telephone Encounter (Signed)
[facility] Informed I will send request to Dr Danella Pentonruong.

## 2016-08-04 ENCOUNTER — Ambulatory Visit (INDEPENDENT_AMBULATORY_CARE_PROVIDER_SITE_OTHER): Payer: Medicare Other | Admitting: Internal Medicine

## 2016-08-04 ENCOUNTER — Encounter: Payer: Self-pay | Admitting: Internal Medicine

## 2016-08-04 VITALS — BP 130/61 | HR 61 | Temp 97.7°F | Wt 148.7 lb

## 2016-08-04 DIAGNOSIS — Z23 Encounter for immunization: Secondary | ICD-10-CM | POA: Diagnosis present

## 2016-08-04 DIAGNOSIS — E559 Vitamin D deficiency, unspecified: Secondary | ICD-10-CM

## 2016-08-04 DIAGNOSIS — Z9689 Presence of other specified functional implants: Secondary | ICD-10-CM

## 2016-08-04 DIAGNOSIS — W19XXXD Unspecified fall, subsequent encounter: Secondary | ICD-10-CM | POA: Diagnosis not present

## 2016-08-04 DIAGNOSIS — Z7982 Long term (current) use of aspirin: Secondary | ICD-10-CM | POA: Diagnosis not present

## 2016-08-04 DIAGNOSIS — Z9981 Dependence on supplemental oxygen: Secondary | ICD-10-CM | POA: Diagnosis not present

## 2016-08-04 DIAGNOSIS — S62101D Fracture of unspecified carpal bone, right wrist, subsequent encounter for fracture with routine healing: Secondary | ICD-10-CM

## 2016-08-04 DIAGNOSIS — K219 Gastro-esophageal reflux disease without esophagitis: Secondary | ICD-10-CM

## 2016-08-04 DIAGNOSIS — Z79899 Other long term (current) drug therapy: Secondary | ICD-10-CM | POA: Diagnosis not present

## 2016-08-04 DIAGNOSIS — N189 Chronic kidney disease, unspecified: Secondary | ICD-10-CM | POA: Diagnosis not present

## 2016-08-04 DIAGNOSIS — Z87891 Personal history of nicotine dependence: Secondary | ICD-10-CM | POA: Diagnosis not present

## 2016-08-04 DIAGNOSIS — J9611 Chronic respiratory failure with hypoxia: Secondary | ICD-10-CM

## 2016-08-04 DIAGNOSIS — Z0000 Encounter for general adult medical examination without abnormal findings: Secondary | ICD-10-CM

## 2016-08-04 MED ORDER — ONDANSETRON HCL 4 MG PO TABS: 4.0000 mg | ORAL_TABLET | Freq: Every day | ORAL | 0 refills | Status: DC | PRN

## 2016-08-04 MED ORDER — ONDANSETRON HCL 4 MG PO TABS: 4.0000 mg | ORAL_TABLET | Freq: Every day | ORAL | 1 refills | Status: DC | PRN

## 2016-08-04 MED ORDER — TRAMADOL HCL 50 MG PO TABS: 100.0000 mg | ORAL_TABLET | Freq: Two times a day (BID) | ORAL | 0 refills | Status: DC

## 2016-08-04 NOTE — Assessment & Plan Note (Addendum)
Assessment: Patient hospitalized for 3 days last month for a right wrist fracture status postfall. She was given a wrist splint and pain medications. She is requesting refills of pain medications.   Plan: Advised to take tylenol prn for pain as she cannot take NSAIDs due to CKD. If pain does not improve can re image wrist.

## 2016-08-04 NOTE — Assessment & Plan Note (Addendum)
Assessment: Patient requesting Zofran for nausea. She has morning nausea. She's previously been admitted for nausea and vomiting and was prescribed Pepcid which is compliant with. Likely nausea from amoxicillin. Plan: Given prescription for Zofran 4mg  qd prn #15 to take for extreme nausea. Caution for QTC prolongation.

## 2016-08-04 NOTE — Telephone Encounter (Signed)
Pt is here today for her appt w/Dr Danella Pentonruong.

## 2016-08-04 NOTE — Assessment & Plan Note (Addendum)
Assessment: Patient's vitamin D level was 15.7 last month she was started on vitamin D 50,000 units once weekly. Follows w/ [facility] q383months Plan: continue vitamin D 50,000 units. Follow-up vitamin D level in 1-2 months.

## 2016-08-04 NOTE — Assessment & Plan Note (Signed)
Given flu shot  today 

## 2016-08-04 NOTE — Progress Notes (Signed)
   CC: wrist pain  HPI:  [name] is a 83 y.o. with possible history as outlined below who presents to clinic for hospital follow-up. She was admitted December 17 through the 20th for a right wrist fracture status post mechanical fall. Her vitamin D level was on be 15.7. PTH 87. She is requesting refill tramadol for pain.  Past Medical History:  Diagnosis Date  . Candidal esophagitis (HCC)    a. 03/2014.  Marland Kitchen. Cholelithiasis   . Chronic diastolic CHF (congestive heart failure) (HCC)    a. 06/2011 Echo: EF 60-65%, no rwma, Gr1 DD, mild TR. // b. Echo 12/19/15: Severe LVH, EF 55-60%, normal wall motion, mild MR, moderate LAE, moderate TR, PASP 44 mmHg  . CKD (chronic kidney disease), stage III    a. in setting of prior ESRD and cadaveric tx in 2003.  Marland Kitchen. Coronary artery disease    a. 01/2001 Cath/PCI: LAD 50p, 8956m, D1 50-60, RCA 4645m (3.0x13 BX Velocity BMS).// b. NSTEMI 5/17 (demand ischemia) in setting of AF with RVR, pneumonia, CKD, a/c HF >> Myoview 12/26/15: prob inf infarct, no ischemia, EF 52%, Low Risk >> med Rx  . Gastritis    a. 03/2014  . Gout    unconfirmed by joint aspiration  . History of bacteremia    a. 08/2004 Group A Strep bacteremia.  . Hypertensive heart disease   . S/P kidney transplant    a. due to FSGN, follows with Dr. Vivia BirminghamMattingly-->Transplant in 2003. Was on HD prior to that.  . Type II diabetes mellitus (HCC)     Review of Systems:  Has DOE that has been stable for the past few months. Has morning nausea.   Physical Exam:  There were no vitals filed for this visit. Physical Exam  Constitutional: oriented to person, place, and time. appears well-developed and well-nourished. No distress.  HENT:  Head: Normocephalic and atraumatic. Cold sore at rt lip corner Nose: Nose normal.  Cardiovascular: Normal rate, regular rhythm and normal heart sounds.  Exam reveals no gallop and no friction rub.   No murmur heard. Pulmonary/Chest: Effort normal and breath sounds  normal. No respiratory distress.  has no wheezes.no rales.  Abdominal: Soft. Bowel sounds are normal.  exhibits no distension. There is no tenderness. There is no rebound and no guarding.  Skin: Skin is warm and dry. No rash noted.  not diaphoretic. No erythema. No pallor.  Ext: neg for pedal edema  Assessment & Plan:   See Encounters Tab for problem based charting.  Patient discussed with Dr. Oswaldo DoneVincent

## 2016-08-04 NOTE — Assessment & Plan Note (Signed)
Assessment: Patient has chronic dyspnea on exertion and wears oxygen for this. She follows with cardiothoracic surgery and has a pleural catheter that was drained 3 times. He was last drained yesterday. On exam lungs are dry.  Plan: Patient has an appointment with cardiothoracic surgery generally 17th. Continue to monitor.

## 2016-08-04 NOTE — Assessment & Plan Note (Deleted)
Assessment: Patient is requesting something for nausea. She has morning nausea daily. Likely nausea from chronic kidney disease.  Plan: Given Rx for Zofran

## 2016-08-05 DIAGNOSIS — Z4682 Encounter for fitting and adjustment of non-vascular catheter: Secondary | ICD-10-CM | POA: Diagnosis not present

## 2016-08-05 DIAGNOSIS — I110 Hypertensive heart disease with heart failure: Secondary | ICD-10-CM | POA: Diagnosis not present

## 2016-08-05 DIAGNOSIS — S6291XD Unspecified fracture of right wrist and hand, subsequent encounter for fracture with routine healing: Secondary | ICD-10-CM | POA: Diagnosis not present

## 2016-08-05 DIAGNOSIS — J90 Pleural effusion, not elsewhere classified: Secondary | ICD-10-CM | POA: Diagnosis not present

## 2016-08-05 DIAGNOSIS — I2510 Atherosclerotic heart disease of native coronary artery without angina pectoris: Secondary | ICD-10-CM | POA: Diagnosis not present

## 2016-08-05 DIAGNOSIS — I5032 Chronic diastolic (congestive) heart failure: Secondary | ICD-10-CM | POA: Diagnosis not present

## 2016-08-05 NOTE — Progress Notes (Signed)
Internal Medicine Clinic Attending  Case discussed with Dr. Truong at the time of the visit.  We reviewed the resident's history and exam and pertinent patient test results.  I agree with the assessment, diagnosis, and plan of care documented in the resident's note.  

## 2016-08-09 ENCOUNTER — Telehealth: Payer: Self-pay

## 2016-08-09 DIAGNOSIS — S6291XD Unspecified fracture of right wrist and hand, subsequent encounter for fracture with routine healing: Secondary | ICD-10-CM | POA: Diagnosis not present

## 2016-08-09 DIAGNOSIS — J90 Pleural effusion, not elsewhere classified: Secondary | ICD-10-CM | POA: Diagnosis not present

## 2016-08-09 DIAGNOSIS — I5032 Chronic diastolic (congestive) heart failure: Secondary | ICD-10-CM | POA: Diagnosis not present

## 2016-08-09 DIAGNOSIS — Z4682 Encounter for fitting and adjustment of non-vascular catheter: Secondary | ICD-10-CM | POA: Diagnosis not present

## 2016-08-09 DIAGNOSIS — I110 Hypertensive heart disease with heart failure: Secondary | ICD-10-CM | POA: Diagnosis not present

## 2016-08-09 DIAGNOSIS — I2510 Atherosclerotic heart disease of native coronary artery without angina pectoris: Secondary | ICD-10-CM | POA: Diagnosis not present

## 2016-08-10 ENCOUNTER — Ambulatory Visit: Payer: Medicare Other | Admitting: Pharmacist

## 2016-08-10 DIAGNOSIS — Z79899 Other long term (current) drug therapy: Secondary | ICD-10-CM

## 2016-08-10 NOTE — Progress Notes (Signed)
Patient reports unable to afford Eliquis with new insurance, will review patient for warfarin.  Medication Samples have been provided to the patient.  Drug: apixaban (Eliquis) Strength: 2.5 mg Qty: 12 LOT: [id number]: [id number] Exp.Date: 5/18 Dosing instructions: 1 tablet BID  The patient has been instructed regarding the correct time, dose, and frequency of taking this medication, including desired effects and most common side effects.   Kim,Jennifer J 6:26 PM 08/10/2016

## 2016-08-12 DIAGNOSIS — I5032 Chronic diastolic (congestive) heart failure: Secondary | ICD-10-CM | POA: Diagnosis not present

## 2016-08-12 DIAGNOSIS — S6291XD Unspecified fracture of right wrist and hand, subsequent encounter for fracture with routine healing: Secondary | ICD-10-CM | POA: Diagnosis not present

## 2016-08-12 DIAGNOSIS — I2510 Atherosclerotic heart disease of native coronary artery without angina pectoris: Secondary | ICD-10-CM | POA: Diagnosis not present

## 2016-08-12 DIAGNOSIS — J90 Pleural effusion, not elsewhere classified: Secondary | ICD-10-CM | POA: Diagnosis not present

## 2016-08-12 DIAGNOSIS — I110 Hypertensive heart disease with heart failure: Secondary | ICD-10-CM | POA: Diagnosis not present

## 2016-08-12 DIAGNOSIS — Z4682 Encounter for fitting and adjustment of non-vascular catheter: Secondary | ICD-10-CM | POA: Diagnosis not present

## 2016-08-16 DIAGNOSIS — I2510 Atherosclerotic heart disease of native coronary artery without angina pectoris: Secondary | ICD-10-CM | POA: Diagnosis not present

## 2016-08-16 DIAGNOSIS — I110 Hypertensive heart disease with heart failure: Secondary | ICD-10-CM | POA: Diagnosis not present

## 2016-08-16 DIAGNOSIS — I5032 Chronic diastolic (congestive) heart failure: Secondary | ICD-10-CM | POA: Diagnosis not present

## 2016-08-16 DIAGNOSIS — J90 Pleural effusion, not elsewhere classified: Secondary | ICD-10-CM | POA: Diagnosis not present

## 2016-08-16 DIAGNOSIS — Z4682 Encounter for fitting and adjustment of non-vascular catheter: Secondary | ICD-10-CM | POA: Diagnosis not present

## 2016-08-16 DIAGNOSIS — S6291XD Unspecified fracture of right wrist and hand, subsequent encounter for fracture with routine healing: Secondary | ICD-10-CM | POA: Diagnosis not present

## 2016-08-17 ENCOUNTER — Other Ambulatory Visit: Payer: Self-pay | Admitting: Cardiothoracic Surgery

## 2016-08-17 DIAGNOSIS — J90 Pleural effusion, not elsewhere classified: Secondary | ICD-10-CM

## 2016-08-18 ENCOUNTER — Ambulatory Visit: Payer: Medicare Other | Admitting: Cardiothoracic Surgery

## 2016-08-19 DIAGNOSIS — I110 Hypertensive heart disease with heart failure: Secondary | ICD-10-CM | POA: Diagnosis not present

## 2016-08-19 DIAGNOSIS — S6291XD Unspecified fracture of right wrist and hand, subsequent encounter for fracture with routine healing: Secondary | ICD-10-CM | POA: Diagnosis not present

## 2016-08-19 DIAGNOSIS — I5032 Chronic diastolic (congestive) heart failure: Secondary | ICD-10-CM | POA: Diagnosis not present

## 2016-08-19 DIAGNOSIS — Z4682 Encounter for fitting and adjustment of non-vascular catheter: Secondary | ICD-10-CM | POA: Diagnosis not present

## 2016-08-19 DIAGNOSIS — J90 Pleural effusion, not elsewhere classified: Secondary | ICD-10-CM | POA: Diagnosis not present

## 2016-08-19 DIAGNOSIS — I2510 Atherosclerotic heart disease of native coronary artery without angina pectoris: Secondary | ICD-10-CM | POA: Diagnosis not present

## 2016-08-20 ENCOUNTER — Encounter: Payer: Self-pay | Admitting: Cardiothoracic Surgery

## 2016-08-20 ENCOUNTER — Ambulatory Visit (INDEPENDENT_AMBULATORY_CARE_PROVIDER_SITE_OTHER): Payer: Medicare Other | Admitting: Cardiothoracic Surgery

## 2016-08-20 ENCOUNTER — Ambulatory Visit
Admission: RE | Admit: 2016-08-20 | Discharge: 2016-08-20 | Disposition: A | Discharge status: Home / Self Care | Payer: Medicare Other | Source: Ambulatory Visit | Attending: Cardiothoracic Surgery | Admitting: Cardiothoracic Surgery

## 2016-08-20 VITALS — BP 115/68 | HR 56 | Resp 20 | Ht 63.0 in | Wt 148.0 lb

## 2016-08-20 DIAGNOSIS — J90 Pleural effusion, not elsewhere classified: Secondary | ICD-10-CM

## 2016-08-20 NOTE — Progress Notes (Signed)
PCP is Gara Kronerruong, Diana, MD Referring Provider is Nyoka CowdenWert, Michael B, MD  Chief Complaint  Patient presents with  . Routine Post Op    f/u from surgery with CXR s/p Placement of right PleurX catheter 07/20/16, currently draining every MONday and Thursdays    HPI: 1 month R Pleurx catheter check - non- malg effusion,      CRF and diastolic HF No SOB drainage mon - thur , about 700 cc CXR with mon effusion ( drained yesterday) and good catheter position  Past Medical History:  Diagnosis Date  . Candidal esophagitis (HCC)    a. 03/2014.  Marland Kitchen. Cholelithiasis   . Chronic diastolic CHF (congestive heart failure) (HCC)    a. 06/2011 Echo: EF 60-65%, no rwma, Gr1 DD, mild TR. // b. Echo 12/19/15: Severe LVH, EF 55-60%, normal wall motion, mild MR, moderate LAE, moderate TR, PASP 44 mmHg  . CKD (chronic kidney disease), stage III    a. in setting of prior ESRD and cadaveric tx in 2003.  Marland Kitchen. Coronary artery disease    a. 01/2001 Cath/PCI: LAD 50p, 9016m, D1 50-60, RCA 5336m (3.0x13 BX Velocity BMS).// b. NSTEMI 5/17 (demand ischemia) in setting of AF with RVR, pneumonia, CKD, a/c HF >> Myoview 12/26/15: prob inf infarct, no ischemia, EF 52%, Low Risk >> med Rx  . Gastritis    a. 03/2014  . Gout    unconfirmed by joint aspiration  . History of bacteremia    a. 08/2004 Group A Strep bacteremia.  . Hypertensive heart disease   . S/P kidney transplant    a. due to FSGN, follows with Dr. Vivia BirminghamMattingly-->Transplant in 2003. Was on HD prior to that.  . Type II diabetes mellitus (HCC)     Past Surgical History:  Procedure Laterality Date  . ABDOMINAL HYSTERECTOMY    . CHEST TUBE INSERTION Right 07/20/2016   Procedure: INSERTION RIGHT PLEURAL DRAINAGE CATHETER;  Surgeon: Kerin PernaPeter Van Trigt, MD;  [facility];  Service: Thoracic;  Laterality: Right;  . ESOPHAGOGASTRODUODENOSCOPY N/A 03/20/2014   Procedure: ESOPHAGOGASTRODUODENOSCOPY (EGD);  Surgeon: Theda BelfastPatrick D Hung, MD;  [facility];  Service: Endoscopy;   Laterality: N/A;  . KIDNEY TRANSPLANT  2003  . VIDEO BRONCHOSCOPY Bilateral 05/12/2016   Procedure: VIDEO BRONCHOSCOPY WITHOUT FLUORO;  Surgeon: Nyoka CowdenMichael B Wert, MD;  Location: [facility] ENDOSCOPY;  Service: Cardiopulmonary;  Laterality: Bilateral;    Family History  Problem Relation Age of Onset  . CAD Father   . CAD Brother   . CAD      Both parents and multiple siblings have died from coronary disease prior to age 81 and several in their 7340's and 4150's.    Social History Social History  Substance Use Topics  . Smoking status: Former Smoker    Quit date: 04/19/1969  . Smokeless tobacco: Never Used  . Alcohol use No    Current Outpatient Prescriptions  Medication Sig Dispense Refill  . allopurinol (ZYLOPRIM) 100 MG tablet Take 100 mg by mouth daily.     Marland Kitchen. amiodarone (PACERONE) 200 MG tablet Take 1 tablet (200 mg total) by mouth 2 (two) times daily. 60 tablet 12  . amLODipine (NORVASC) 10 MG tablet Take 5 mg by mouth daily.    Marland Kitchen. amoxicillin (AMOXIL) 500 MG capsule Take 1 capsule (500 mg total) by mouth 3 (three) times daily. 120 capsule 1  . apixaban (ELIQUIS) 2.5 MG TABS tablet Take 1 tablet (2.5 mg total) by mouth 2 (two) times daily. Cancel previous Eliquis prescription  60 tablet 3  . aspirin EC 81 MG tablet Take 81 mg by mouth daily.    . cinacalcet (SENSIPAR) 90 MG tablet Take 22.5 mg by mouth See admin instructions. Once every one to two weeks    . famotidine (PEPCID) 20 MG tablet Take 20 mg by mouth 2 (two) times daily.    . feeding supplement, ENSURE ENLIVE, (ENSURE ENLIVE) LIQD Take 237 mLs by mouth 2 (two) times daily between meals. 237 mL 12  . mycophenolate (CELLCEPT) 250 MG capsule Take 500 mg by mouth 2 (two) times daily.    . ondansetron (ZOFRAN) 4 MG tablet Take 1 tablet (4 mg total) by mouth daily as needed for nausea or vomiting. 15 tablet 0  . OXYGEN Inhale into the lungs. 2lpm 24/7    . simvastatin (ZOCOR) 20 MG tablet Take 20 mg by mouth every evening.    . tacrolimus  (PROGRAF) 0.5 MG capsule 1 mg in the morning and 1 mg at bedtime (Patient taking differently: Take 2-2.5 mg by mouth See admin instructions. Take 2.5 mg by mouth in the morning and take 2 mg by mouth at night.) 120 capsule 0   No current facility-administered medications for this visit.     Allergies  Allergen Reactions  . Sulfa Drugs Cross Reactors Swelling    Review of Systems   No fever No blood in pleural fluid On no lasix No SOB  BP 115/68   Pulse (!) 56   Resp 20   Ht 5\' 3"  (1.6 m)   Wt 148 lb (67.1 kg)   SpO2 96% Comment: RA  BMI 26.22 kg/m  Physical Exam Catheter site clean- insertion site sutures removed Breath sounds clear No abd swelling  Neuro intact  Diagnostic Tests: CXR reviewed peersonally as above  Impression: Good symptomatic result from R Pleurx cathetetr  Plan: Increase drainage schedule to M-W-F F/u in 1 month w/ CXR  Mikey Bussing, MD [facility] [phone]

## 2016-08-23 DIAGNOSIS — I5032 Chronic diastolic (congestive) heart failure: Secondary | ICD-10-CM | POA: Diagnosis not present

## 2016-08-23 DIAGNOSIS — Z4682 Encounter for fitting and adjustment of non-vascular catheter: Secondary | ICD-10-CM | POA: Diagnosis not present

## 2016-08-23 DIAGNOSIS — J90 Pleural effusion, not elsewhere classified: Secondary | ICD-10-CM | POA: Diagnosis not present

## 2016-08-23 DIAGNOSIS — S6291XD Unspecified fracture of right wrist and hand, subsequent encounter for fracture with routine healing: Secondary | ICD-10-CM | POA: Diagnosis not present

## 2016-08-23 DIAGNOSIS — I110 Hypertensive heart disease with heart failure: Secondary | ICD-10-CM | POA: Diagnosis not present

## 2016-08-23 DIAGNOSIS — I2510 Atherosclerotic heart disease of native coronary artery without angina pectoris: Secondary | ICD-10-CM | POA: Diagnosis not present

## 2016-08-26 DIAGNOSIS — Z4682 Encounter for fitting and adjustment of non-vascular catheter: Secondary | ICD-10-CM | POA: Diagnosis not present

## 2016-08-26 DIAGNOSIS — S6291XD Unspecified fracture of right wrist and hand, subsequent encounter for fracture with routine healing: Secondary | ICD-10-CM | POA: Diagnosis not present

## 2016-08-26 DIAGNOSIS — I2510 Atherosclerotic heart disease of native coronary artery without angina pectoris: Secondary | ICD-10-CM | POA: Diagnosis not present

## 2016-08-26 DIAGNOSIS — I110 Hypertensive heart disease with heart failure: Secondary | ICD-10-CM | POA: Diagnosis not present

## 2016-08-26 DIAGNOSIS — J90 Pleural effusion, not elsewhere classified: Secondary | ICD-10-CM | POA: Diagnosis not present

## 2016-08-26 DIAGNOSIS — I5032 Chronic diastolic (congestive) heart failure: Secondary | ICD-10-CM | POA: Diagnosis not present

## 2016-08-30 DIAGNOSIS — I2510 Atherosclerotic heart disease of native coronary artery without angina pectoris: Secondary | ICD-10-CM | POA: Diagnosis not present

## 2016-08-30 DIAGNOSIS — J90 Pleural effusion, not elsewhere classified: Secondary | ICD-10-CM | POA: Diagnosis not present

## 2016-08-30 DIAGNOSIS — I5032 Chronic diastolic (congestive) heart failure: Secondary | ICD-10-CM | POA: Diagnosis not present

## 2016-08-30 DIAGNOSIS — I110 Hypertensive heart disease with heart failure: Secondary | ICD-10-CM | POA: Diagnosis not present

## 2016-08-30 DIAGNOSIS — Z4682 Encounter for fitting and adjustment of non-vascular catheter: Secondary | ICD-10-CM | POA: Diagnosis not present

## 2016-08-30 DIAGNOSIS — S6291XD Unspecified fracture of right wrist and hand, subsequent encounter for fracture with routine healing: Secondary | ICD-10-CM | POA: Diagnosis not present

## 2016-08-31 DIAGNOSIS — I110 Hypertensive heart disease with heart failure: Secondary | ICD-10-CM | POA: Diagnosis not present

## 2016-08-31 DIAGNOSIS — I2510 Atherosclerotic heart disease of native coronary artery without angina pectoris: Secondary | ICD-10-CM | POA: Diagnosis not present

## 2016-08-31 DIAGNOSIS — I5032 Chronic diastolic (congestive) heart failure: Secondary | ICD-10-CM | POA: Diagnosis not present

## 2016-08-31 DIAGNOSIS — J90 Pleural effusion, not elsewhere classified: Secondary | ICD-10-CM | POA: Diagnosis not present

## 2016-08-31 DIAGNOSIS — Z4682 Encounter for fitting and adjustment of non-vascular catheter: Secondary | ICD-10-CM | POA: Diagnosis not present

## 2016-08-31 DIAGNOSIS — S6291XD Unspecified fracture of right wrist and hand, subsequent encounter for fracture with routine healing: Secondary | ICD-10-CM | POA: Diagnosis not present

## 2016-09-03 DIAGNOSIS — I110 Hypertensive heart disease with heart failure: Secondary | ICD-10-CM | POA: Diagnosis not present

## 2016-09-03 DIAGNOSIS — I5032 Chronic diastolic (congestive) heart failure: Secondary | ICD-10-CM | POA: Diagnosis not present

## 2016-09-03 DIAGNOSIS — Z4682 Encounter for fitting and adjustment of non-vascular catheter: Secondary | ICD-10-CM | POA: Diagnosis not present

## 2016-09-03 DIAGNOSIS — J90 Pleural effusion, not elsewhere classified: Secondary | ICD-10-CM | POA: Diagnosis not present

## 2016-09-03 DIAGNOSIS — I2510 Atherosclerotic heart disease of native coronary artery without angina pectoris: Secondary | ICD-10-CM | POA: Diagnosis not present

## 2016-09-03 DIAGNOSIS — S6291XD Unspecified fracture of right wrist and hand, subsequent encounter for fracture with routine healing: Secondary | ICD-10-CM | POA: Diagnosis not present

## 2016-09-06 DIAGNOSIS — S6291XD Unspecified fracture of right wrist and hand, subsequent encounter for fracture with routine healing: Secondary | ICD-10-CM | POA: Diagnosis not present

## 2016-09-06 DIAGNOSIS — I110 Hypertensive heart disease with heart failure: Secondary | ICD-10-CM | POA: Diagnosis not present

## 2016-09-06 DIAGNOSIS — I2510 Atherosclerotic heart disease of native coronary artery without angina pectoris: Secondary | ICD-10-CM | POA: Diagnosis not present

## 2016-09-06 DIAGNOSIS — Z4682 Encounter for fitting and adjustment of non-vascular catheter: Secondary | ICD-10-CM | POA: Diagnosis not present

## 2016-09-06 DIAGNOSIS — J90 Pleural effusion, not elsewhere classified: Secondary | ICD-10-CM | POA: Diagnosis not present

## 2016-09-06 DIAGNOSIS — I5032 Chronic diastolic (congestive) heart failure: Secondary | ICD-10-CM | POA: Diagnosis not present

## 2016-09-10 DIAGNOSIS — I110 Hypertensive heart disease with heart failure: Secondary | ICD-10-CM | POA: Diagnosis not present

## 2016-09-10 DIAGNOSIS — Z4682 Encounter for fitting and adjustment of non-vascular catheter: Secondary | ICD-10-CM | POA: Diagnosis not present

## 2016-09-10 DIAGNOSIS — J90 Pleural effusion, not elsewhere classified: Secondary | ICD-10-CM | POA: Diagnosis not present

## 2016-09-10 DIAGNOSIS — I5032 Chronic diastolic (congestive) heart failure: Secondary | ICD-10-CM | POA: Diagnosis not present

## 2016-09-10 DIAGNOSIS — S6291XD Unspecified fracture of right wrist and hand, subsequent encounter for fracture with routine healing: Secondary | ICD-10-CM | POA: Diagnosis not present

## 2016-09-10 DIAGNOSIS — I2510 Atherosclerotic heart disease of native coronary artery without angina pectoris: Secondary | ICD-10-CM | POA: Diagnosis not present

## 2016-09-13 DIAGNOSIS — I5032 Chronic diastolic (congestive) heart failure: Secondary | ICD-10-CM | POA: Diagnosis not present

## 2016-09-13 DIAGNOSIS — Z4682 Encounter for fitting and adjustment of non-vascular catheter: Secondary | ICD-10-CM | POA: Diagnosis not present

## 2016-09-13 DIAGNOSIS — I110 Hypertensive heart disease with heart failure: Secondary | ICD-10-CM | POA: Diagnosis not present

## 2016-09-13 DIAGNOSIS — J90 Pleural effusion, not elsewhere classified: Secondary | ICD-10-CM | POA: Diagnosis not present

## 2016-09-13 DIAGNOSIS — S6291XD Unspecified fracture of right wrist and hand, subsequent encounter for fracture with routine healing: Secondary | ICD-10-CM | POA: Diagnosis not present

## 2016-09-13 DIAGNOSIS — I2510 Atherosclerotic heart disease of native coronary artery without angina pectoris: Secondary | ICD-10-CM | POA: Diagnosis not present

## 2016-09-16 DIAGNOSIS — Z940 Kidney transplant status: Secondary | ICD-10-CM | POA: Diagnosis not present

## 2016-09-16 DIAGNOSIS — E212 Other hyperparathyroidism: Secondary | ICD-10-CM | POA: Diagnosis not present

## 2016-09-17 DIAGNOSIS — J90 Pleural effusion, not elsewhere classified: Secondary | ICD-10-CM | POA: Diagnosis not present

## 2016-09-17 DIAGNOSIS — Z4682 Encounter for fitting and adjustment of non-vascular catheter: Secondary | ICD-10-CM | POA: Diagnosis not present

## 2016-09-17 DIAGNOSIS — I2510 Atherosclerotic heart disease of native coronary artery without angina pectoris: Secondary | ICD-10-CM | POA: Diagnosis not present

## 2016-09-17 DIAGNOSIS — S6291XD Unspecified fracture of right wrist and hand, subsequent encounter for fracture with routine healing: Secondary | ICD-10-CM | POA: Diagnosis not present

## 2016-09-17 DIAGNOSIS — I5032 Chronic diastolic (congestive) heart failure: Secondary | ICD-10-CM | POA: Diagnosis not present

## 2016-09-17 DIAGNOSIS — I110 Hypertensive heart disease with heart failure: Secondary | ICD-10-CM | POA: Diagnosis not present

## 2016-09-20 DIAGNOSIS — Z940 Kidney transplant status: Secondary | ICD-10-CM | POA: Diagnosis not present

## 2016-09-20 DIAGNOSIS — S6291XD Unspecified fracture of right wrist and hand, subsequent encounter for fracture with routine healing: Secondary | ICD-10-CM | POA: Diagnosis not present

## 2016-09-20 DIAGNOSIS — I2510 Atherosclerotic heart disease of native coronary artery without angina pectoris: Secondary | ICD-10-CM | POA: Diagnosis not present

## 2016-09-20 DIAGNOSIS — I5032 Chronic diastolic (congestive) heart failure: Secondary | ICD-10-CM | POA: Diagnosis not present

## 2016-09-20 DIAGNOSIS — J9611 Chronic respiratory failure with hypoxia: Secondary | ICD-10-CM | POA: Diagnosis not present

## 2016-09-20 DIAGNOSIS — Z4682 Encounter for fitting and adjustment of non-vascular catheter: Secondary | ICD-10-CM | POA: Diagnosis not present

## 2016-09-20 DIAGNOSIS — I110 Hypertensive heart disease with heart failure: Secondary | ICD-10-CM | POA: Diagnosis not present

## 2016-09-20 DIAGNOSIS — J449 Chronic obstructive pulmonary disease, unspecified: Secondary | ICD-10-CM | POA: Diagnosis not present

## 2016-09-20 DIAGNOSIS — J90 Pleural effusion, not elsewhere classified: Secondary | ICD-10-CM | POA: Diagnosis not present

## 2016-09-20 DIAGNOSIS — Z7982 Long term (current) use of aspirin: Secondary | ICD-10-CM | POA: Diagnosis not present

## 2016-09-20 DIAGNOSIS — W19XXXD Unspecified fall, subsequent encounter: Secondary | ICD-10-CM | POA: Diagnosis not present

## 2016-09-24 ENCOUNTER — Other Ambulatory Visit: Payer: Self-pay | Admitting: *Deleted

## 2016-09-24 ENCOUNTER — Telehealth: Payer: Self-pay | Admitting: *Deleted

## 2016-09-24 DIAGNOSIS — J90 Pleural effusion, not elsewhere classified: Secondary | ICD-10-CM | POA: Diagnosis not present

## 2016-09-24 DIAGNOSIS — Z4682 Encounter for fitting and adjustment of non-vascular catheter: Secondary | ICD-10-CM | POA: Diagnosis not present

## 2016-09-24 DIAGNOSIS — I2510 Atherosclerotic heart disease of native coronary artery without angina pectoris: Secondary | ICD-10-CM | POA: Diagnosis not present

## 2016-09-24 DIAGNOSIS — S6291XD Unspecified fracture of right wrist and hand, subsequent encounter for fracture with routine healing: Secondary | ICD-10-CM | POA: Diagnosis not present

## 2016-09-24 DIAGNOSIS — I5032 Chronic diastolic (congestive) heart failure: Secondary | ICD-10-CM | POA: Diagnosis not present

## 2016-09-24 DIAGNOSIS — I110 Hypertensive heart disease with heart failure: Secondary | ICD-10-CM | POA: Diagnosis not present

## 2016-09-24 NOTE — Telephone Encounter (Signed)
HHN, kim, fell wed in the home, caught cane and fell, bruised R arm, no open wounds, doing well.  HHN would like VO for eval and treat PT and OT for home safety, balance and ambulation. VO given, do you agree?

## 2016-09-25 NOTE — Telephone Encounter (Signed)
Yes. Thanks!  Dr. Estephany Perot 

## 2016-09-27 DIAGNOSIS — I110 Hypertensive heart disease with heart failure: Secondary | ICD-10-CM | POA: Diagnosis not present

## 2016-09-27 DIAGNOSIS — S6291XD Unspecified fracture of right wrist and hand, subsequent encounter for fracture with routine healing: Secondary | ICD-10-CM | POA: Diagnosis not present

## 2016-09-27 DIAGNOSIS — I5032 Chronic diastolic (congestive) heart failure: Secondary | ICD-10-CM | POA: Diagnosis not present

## 2016-09-27 DIAGNOSIS — Z4682 Encounter for fitting and adjustment of non-vascular catheter: Secondary | ICD-10-CM | POA: Diagnosis not present

## 2016-09-27 DIAGNOSIS — J90 Pleural effusion, not elsewhere classified: Secondary | ICD-10-CM | POA: Diagnosis not present

## 2016-09-27 DIAGNOSIS — I2510 Atherosclerotic heart disease of native coronary artery without angina pectoris: Secondary | ICD-10-CM | POA: Diagnosis not present

## 2016-09-28 DIAGNOSIS — I2510 Atherosclerotic heart disease of native coronary artery without angina pectoris: Secondary | ICD-10-CM | POA: Diagnosis not present

## 2016-09-28 DIAGNOSIS — S6291XD Unspecified fracture of right wrist and hand, subsequent encounter for fracture with routine healing: Secondary | ICD-10-CM | POA: Diagnosis not present

## 2016-09-28 DIAGNOSIS — Z4682 Encounter for fitting and adjustment of non-vascular catheter: Secondary | ICD-10-CM | POA: Diagnosis not present

## 2016-09-28 DIAGNOSIS — I5032 Chronic diastolic (congestive) heart failure: Secondary | ICD-10-CM | POA: Diagnosis not present

## 2016-09-28 DIAGNOSIS — I110 Hypertensive heart disease with heart failure: Secondary | ICD-10-CM | POA: Diagnosis not present

## 2016-09-28 DIAGNOSIS — J90 Pleural effusion, not elsewhere classified: Secondary | ICD-10-CM | POA: Diagnosis not present

## 2016-09-29 ENCOUNTER — Ambulatory Visit
Admission: RE | Admit: 2016-09-29 | Discharge: 2016-09-29 | Disposition: A | Discharge status: Home / Self Care | Payer: Medicare Other | Source: Ambulatory Visit | Attending: Cardiothoracic Surgery | Admitting: Cardiothoracic Surgery

## 2016-09-29 ENCOUNTER — Ambulatory Visit (INDEPENDENT_AMBULATORY_CARE_PROVIDER_SITE_OTHER): Payer: Medicare Other | Admitting: Cardiothoracic Surgery

## 2016-09-29 ENCOUNTER — Encounter: Payer: Self-pay | Admitting: Cardiothoracic Surgery

## 2016-09-29 VITALS — BP 125/71 | HR 50 | Resp 20 | Ht 63.0 in | Wt 148.0 lb

## 2016-09-29 DIAGNOSIS — J90 Pleural effusion, not elsewhere classified: Secondary | ICD-10-CM

## 2016-09-29 NOTE — Progress Notes (Signed)
PCP is Gara Kronerruong, Diana, MD Referring Provider is Nyoka CowdenWert, Michael B, MD  Chief Complaint  Patient presents with  . Pleural Effusion    4 week f/u with CXR, draining pleurX M/W/F    HPI: Right Pleurx catheter for nonmalignant recurrent right effusion Etiology is diastolic heart failure as well as chronic renal failure, status post renal transplant Drainage is been 3 days a week 700- 800 cc clear fluid Catheter site without drainage or signs of infection Chest x-ray prior to drainage in the office today shows blunting of the right costophrenic angle-850 cc subsequent removed Patient complains of pain at the end of the drainage session and requests tramadol Past Medical History:  Diagnosis Date  . Candidal esophagitis (HCC)    a. 03/2014.  Marland Kitchen. Cholelithiasis   . Chronic diastolic CHF (congestive heart failure) (HCC)    a. 06/2011 Echo: EF 60-65%, no rwma, Gr1 DD, mild TR. // b. Echo 12/19/15: Severe LVH, EF 55-60%, normal wall motion, mild MR, moderate LAE, moderate TR, PASP 44 mmHg  . CKD (chronic kidney disease), stage III    a. in setting of prior ESRD and cadaveric tx in 2003.  Marland Kitchen. Coronary artery disease    a. 01/2001 Cath/PCI: LAD 50p, 5175m, D1 50-60, RCA 177m (3.0x13 BX Velocity BMS).// b. NSTEMI 5/17 (demand ischemia) in setting of AF with RVR, pneumonia, CKD, a/c HF >> Myoview 12/26/15: prob inf infarct, no ischemia, EF 52%, Low Risk >> med Rx  . Gastritis    a. 03/2014  . Gout    unconfirmed by joint aspiration  . History of bacteremia    a. 08/2004 Group A Strep bacteremia.  . Hypertensive heart disease   . S/P kidney transplant    a. due to FSGN, follows with Dr. Vivia BirminghamMattingly-->Transplant in 2003. Was on HD prior to that.  . Type II diabetes mellitus (HCC)     Past Surgical History:  Procedure Laterality Date  . ABDOMINAL HYSTERECTOMY    . CHEST TUBE INSERTION Right 07/20/2016   Procedure: INSERTION RIGHT PLEURAL DRAINAGE CATHETER;  Surgeon: Kerin PernaPeter Van Trigt, MD;  [facility];   Service: Thoracic;  Laterality: Right;  . ESOPHAGOGASTRODUODENOSCOPY N/A 03/20/2014   Procedure: ESOPHAGOGASTRODUODENOSCOPY (EGD);  Surgeon: Theda BelfastPatrick D Hung, MD;  [facility];  Service: Endoscopy;  Laterality: N/A;  . KIDNEY TRANSPLANT  2003  . VIDEO BRONCHOSCOPY Bilateral 05/12/2016   Procedure: VIDEO BRONCHOSCOPY WITHOUT FLUORO;  Surgeon: Nyoka CowdenMichael B Wert, MD;  Location: [facility] ENDOSCOPY;  Service: Cardiopulmonary;  Laterality: Bilateral;    Family History  Problem Relation Age of Onset  . CAD Father   . CAD Brother   . CAD      Both parents and multiple siblings have died from coronary disease prior to age 81 and several in their 5640's and 7550's.    Social History Social History  Substance Use Topics  . Smoking status: Former Smoker    Quit date: 04/19/1969  . Smokeless tobacco: Never Used  . Alcohol use No    Current Outpatient Prescriptions  Medication Sig Dispense Refill  . allopurinol (ZYLOPRIM) 100 MG tablet Take 100 mg by mouth daily.     Marland Kitchen. amiodarone (PACERONE) 200 MG tablet Take 1 tablet (200 mg total) by mouth 2 (two) times daily. 60 tablet 12  . amLODipine (NORVASC) 10 MG tablet Take 5 mg by mouth daily.    Marland Kitchen. amoxicillin (AMOXIL) 500 MG capsule Take 1 capsule (500 mg total) by mouth 3 (three) times daily. 120 capsule 1  .  apixaban (ELIQUIS) 2.5 MG TABS tablet Take 1 tablet (2.5 mg total) by mouth 2 (two) times daily. Cancel previous Eliquis prescription 60 tablet 3  . aspirin EC 81 MG tablet Take 81 mg by mouth daily.    . cinacalcet (SENSIPAR) 90 MG tablet Take 22.5 mg by mouth See admin instructions. Once every one to two weeks    . famotidine (PEPCID) 20 MG tablet Take 20 mg by mouth 2 (two) times daily.    . feeding supplement, ENSURE ENLIVE, (ENSURE ENLIVE) LIQD Take 237 mLs by mouth 2 (two) times daily between meals. 237 mL 12  . mycophenolate (CELLCEPT) 250 MG capsule Take 500 mg by mouth 2 (two) times daily.    . ondansetron (ZOFRAN) 4 MG tablet Take 1 tablet  (4 mg total) by mouth daily as needed for nausea or vomiting. 15 tablet 0  . OXYGEN Inhale into the lungs. 2lpm 24/7    . simvastatin (ZOCOR) 20 MG tablet Take 20 mg by mouth every evening.    . tacrolimus (PROGRAF) 0.5 MG capsule 1 mg in the morning and 1 mg at bedtime (Patient taking differently: Take 2-2.5 mg by mouth See admin instructions. Take 2.5 mg by mouth in the morning and take 2 mg by mouth at night.) 120 capsule 0   No current facility-administered medications for this visit.     Allergies  Allergen Reactions  . Sulfa Drugs Cross Reactors Swelling    Review of Systems  Uses a wheelchair Improved breathing with Pleurx catheter drainage of effusion No fever Chest pain at the end of drainage procedures No falls Appetite is fair  BP 125/71   Pulse (!) 50   Resp 20   Ht 5\' 3"  (1.6 m)   Wt 148 lb (67.1 kg)   SpO2 94% Comment: RA  BMI 26.22 kg/m  Physical Exam       Exam Catheter site personally examined and is clean and dry, silk suture is  removed today   General- alert and comfortable   Lungs- clear without rales, wheezes   Cor- regular rate and rhythm, no murmur , gallop   Abdomen- soft, non-tender   Extremities - warm, non-tender, minimal edema   Neuro- oriented, appropriate, no focal weakness  Diagnostic Tests: Chest x-ray prior to drainage shows blunting of the right costophrenic angle, Pleurx catheter in good position  Impression: Persistent high level drainage Continue Monday Wednesday Friday drainage schedule Plan: Return next month with chest x-ray to review Pleurx catheter management  Mikey Bussing, MD [facility] [phone]

## 2016-09-30 DIAGNOSIS — S6291XD Unspecified fracture of right wrist and hand, subsequent encounter for fracture with routine healing: Secondary | ICD-10-CM | POA: Diagnosis not present

## 2016-09-30 DIAGNOSIS — I5032 Chronic diastolic (congestive) heart failure: Secondary | ICD-10-CM | POA: Diagnosis not present

## 2016-09-30 DIAGNOSIS — I2510 Atherosclerotic heart disease of native coronary artery without angina pectoris: Secondary | ICD-10-CM | POA: Diagnosis not present

## 2016-09-30 DIAGNOSIS — J90 Pleural effusion, not elsewhere classified: Secondary | ICD-10-CM | POA: Diagnosis not present

## 2016-09-30 DIAGNOSIS — Z4682 Encounter for fitting and adjustment of non-vascular catheter: Secondary | ICD-10-CM | POA: Diagnosis not present

## 2016-09-30 DIAGNOSIS — I110 Hypertensive heart disease with heart failure: Secondary | ICD-10-CM | POA: Diagnosis not present

## 2016-10-01 DIAGNOSIS — J90 Pleural effusion, not elsewhere classified: Secondary | ICD-10-CM | POA: Diagnosis not present

## 2016-10-01 DIAGNOSIS — I2510 Atherosclerotic heart disease of native coronary artery without angina pectoris: Secondary | ICD-10-CM | POA: Diagnosis not present

## 2016-10-01 DIAGNOSIS — I5032 Chronic diastolic (congestive) heart failure: Secondary | ICD-10-CM | POA: Diagnosis not present

## 2016-10-01 DIAGNOSIS — S6291XD Unspecified fracture of right wrist and hand, subsequent encounter for fracture with routine healing: Secondary | ICD-10-CM | POA: Diagnosis not present

## 2016-10-01 DIAGNOSIS — Z4682 Encounter for fitting and adjustment of non-vascular catheter: Secondary | ICD-10-CM | POA: Diagnosis not present

## 2016-10-01 DIAGNOSIS — I110 Hypertensive heart disease with heart failure: Secondary | ICD-10-CM | POA: Diagnosis not present

## 2016-10-04 DIAGNOSIS — S6291XD Unspecified fracture of right wrist and hand, subsequent encounter for fracture with routine healing: Secondary | ICD-10-CM | POA: Diagnosis not present

## 2016-10-04 DIAGNOSIS — I2510 Atherosclerotic heart disease of native coronary artery without angina pectoris: Secondary | ICD-10-CM | POA: Diagnosis not present

## 2016-10-04 DIAGNOSIS — J90 Pleural effusion, not elsewhere classified: Secondary | ICD-10-CM | POA: Diagnosis not present

## 2016-10-04 DIAGNOSIS — I5032 Chronic diastolic (congestive) heart failure: Secondary | ICD-10-CM | POA: Diagnosis not present

## 2016-10-04 DIAGNOSIS — I110 Hypertensive heart disease with heart failure: Secondary | ICD-10-CM | POA: Diagnosis not present

## 2016-10-04 DIAGNOSIS — Z4682 Encounter for fitting and adjustment of non-vascular catheter: Secondary | ICD-10-CM | POA: Diagnosis not present

## 2016-10-05 DIAGNOSIS — I5032 Chronic diastolic (congestive) heart failure: Secondary | ICD-10-CM | POA: Diagnosis not present

## 2016-10-05 DIAGNOSIS — S6291XD Unspecified fracture of right wrist and hand, subsequent encounter for fracture with routine healing: Secondary | ICD-10-CM | POA: Diagnosis not present

## 2016-10-05 DIAGNOSIS — Z4682 Encounter for fitting and adjustment of non-vascular catheter: Secondary | ICD-10-CM | POA: Diagnosis not present

## 2016-10-05 DIAGNOSIS — I2510 Atherosclerotic heart disease of native coronary artery without angina pectoris: Secondary | ICD-10-CM | POA: Diagnosis not present

## 2016-10-05 DIAGNOSIS — J90 Pleural effusion, not elsewhere classified: Secondary | ICD-10-CM | POA: Diagnosis not present

## 2016-10-05 DIAGNOSIS — I110 Hypertensive heart disease with heart failure: Secondary | ICD-10-CM | POA: Diagnosis not present

## 2016-10-06 ENCOUNTER — Encounter: Payer: Self-pay | Admitting: Internal Medicine

## 2016-10-06 ENCOUNTER — Ambulatory Visit (INDEPENDENT_AMBULATORY_CARE_PROVIDER_SITE_OTHER): Payer: Medicare Other | Admitting: Internal Medicine

## 2016-10-06 ENCOUNTER — Ambulatory Visit: Payer: Medicare Other | Admitting: Dietician

## 2016-10-06 ENCOUNTER — Ambulatory Visit: Payer: Medicare Other | Admitting: Internal Medicine

## 2016-10-06 VITALS — BP 132/72 | HR 54 | Temp 98.5°F | Wt 141.9 lb

## 2016-10-06 DIAGNOSIS — M1A9XX0 Chronic gout, unspecified, without tophus (tophi): Secondary | ICD-10-CM

## 2016-10-06 DIAGNOSIS — Z87891 Personal history of nicotine dependence: Secondary | ICD-10-CM | POA: Diagnosis not present

## 2016-10-06 DIAGNOSIS — Z66 Do not resuscitate: Secondary | ICD-10-CM | POA: Diagnosis not present

## 2016-10-06 DIAGNOSIS — Z7189 Other specified counseling: Secondary | ICD-10-CM | POA: Diagnosis not present

## 2016-10-06 DIAGNOSIS — M109 Gout, unspecified: Secondary | ICD-10-CM

## 2016-10-06 DIAGNOSIS — Z0000 Encounter for general adult medical examination without abnormal findings: Secondary | ICD-10-CM

## 2016-10-06 NOTE — Progress Notes (Signed)
   CC: Annual wellness exam  HPI:  [name] is a 83 y.o. with past medical history as outlined below who presents to clinic for her Medicare wellness visit. She has no complaints at this time. Problem was further details of patient's chronic medical issues.  Past Medical History:  Diagnosis Date  . Candidal esophagitis (HCC)    a. 03/2014.  Marland Kitchen. Cholelithiasis   . Chronic diastolic CHF (congestive heart failure) (HCC)    a. 06/2011 Echo: EF 60-65%, no rwma, Gr1 DD, mild TR. // b. Echo 12/19/15: Severe LVH, EF 55-60%, normal wall motion, mild MR, moderate LAE, moderate TR, PASP 44 mmHg  . CKD (chronic kidney disease), stage III    a. in setting of prior ESRD and cadaveric tx in 2003.  Marland Kitchen. Coronary artery disease    a. 01/2001 Cath/PCI: LAD 50p, 6039m, D1 50-60, RCA 4423m (3.0x13 BX Velocity BMS).// b. NSTEMI 5/17 (demand ischemia) in setting of AF with RVR, pneumonia, CKD, a/c HF >> Myoview 12/26/15: prob inf infarct, no ischemia, EF 52%, Low Risk >> med Rx  . Gastritis    a. 03/2014  . Gout    unconfirmed by joint aspiration  . History of bacteremia    a. 08/2004 Group A Strep bacteremia.  . Hypertensive heart disease   . S/P kidney transplant    a. due to FSGN, follows with Dr. Vivia BirminghamMattingly-->Transplant in 2003. Was on HD prior to that.  . Type II diabetes mellitus (HCC)     Review of Systems:  Increased shortness of breath with exertion that is minimally relieved with shifting of the, denies any pain, appetite is good. Denies light headedness or dizziness.   Physical Exam:  Vitals:   10/06/16 1619  BP: 132/72  Pulse: (!) 54  Temp: 98.5 F (36.9 C)  TempSrc: Oral  SpO2: 99%  Weight: 141 lb 14.4 oz (64.4 kg)   Physical Exam  Constitutional:  appears well-developed and well-nourished. No distress.  HENT:  Head: Normocephalic and atraumatic.  Nose: Nose normal.  Pulmonary/Chest: Effort normal, no accessory muscle use, speaking in full sentences.  No respiratory distress.     Neurological: alert and oriented to person, place, and time.  Skin: Skin is warm and dry. No rash noted.  not diaphoretic. No erythema. No pallor.    Assessment & Plan:   See Encounters Tab for problem based charting.  Patient discussed with Dr. Cyndie ChimeGranfortuna

## 2016-10-07 NOTE — Progress Notes (Signed)
Patient here for annual wellness visit. She is accompanied by two other persons. Assisted Dr. Danella Pentonruong with completing the following: 1.  Care team in EPIC 2. Prepare for your care worksheet only per patient   preference- gave patient 2 copies and Dr. Danella Pentonruong one copy 3. Mini-Cog 4. Short version of the Health Risk Assessment  Ms. Carra refused the TUG test, to be videotaped or have a discussion on goals of care. She was provided with information on Living wills and health care power of attorney. Plyler, Lupita LeashDonna, RD 10/07/2016 12:13 PM.

## 2016-10-07 NOTE — Assessment & Plan Note (Signed)
A: No longer taking allopurinol. No issues with gout flares.   P: removed allopurinol from med list.

## 2016-10-07 NOTE — Progress Notes (Signed)
Medicine attending: Medical history, presenting problems, physical findings, and medications, reviewed with resident physician Dr Diana Truong on the day of the patient visit and I concur with her evaluation and management plan. 

## 2016-10-07 NOTE — Assessment & Plan Note (Signed)
Patient presents for advanced care planning. She met with Butch Penny prior to visit to discuss goals of care. Her daughter is her healthcare power of attorney. She was given paperwork to have her daughter listed as her healthcare power of attorney and advised she can call clinic if she has difficulty getting paperwork that arise. She is DO NOT RESUSCITATE and has stated this in the past during her prior hospitalization. We also filled out a MOST form indicating that she would like to have IV for fluids and IV antibiotics however she would not want CPR, intubation, or feeding tube. These forms were scanned into chart. F/u in 6 months.

## 2016-10-07 NOTE — Assessment & Plan Note (Signed)
A: Patient presents for annual wellness visit. She has no complaints today. She has home health physical therapy come to her house to help her with her conditioning. Plan: Reviewed medications with patient. Due to age and comorbidities it was agreed upon to remove simvastatin from her medication list. No other screening tests needed at this time. Patient does not need any refills of her medications. Follow-up in 6 months.

## 2016-10-08 DIAGNOSIS — S6291XD Unspecified fracture of right wrist and hand, subsequent encounter for fracture with routine healing: Secondary | ICD-10-CM | POA: Diagnosis not present

## 2016-10-08 DIAGNOSIS — I5032 Chronic diastolic (congestive) heart failure: Secondary | ICD-10-CM | POA: Diagnosis not present

## 2016-10-08 DIAGNOSIS — I2510 Atherosclerotic heart disease of native coronary artery without angina pectoris: Secondary | ICD-10-CM | POA: Diagnosis not present

## 2016-10-08 DIAGNOSIS — Z4682 Encounter for fitting and adjustment of non-vascular catheter: Secondary | ICD-10-CM | POA: Diagnosis not present

## 2016-10-08 DIAGNOSIS — J90 Pleural effusion, not elsewhere classified: Secondary | ICD-10-CM | POA: Diagnosis not present

## 2016-10-08 DIAGNOSIS — I110 Hypertensive heart disease with heart failure: Secondary | ICD-10-CM | POA: Diagnosis not present

## 2016-10-11 DIAGNOSIS — J90 Pleural effusion, not elsewhere classified: Secondary | ICD-10-CM | POA: Diagnosis not present

## 2016-10-11 DIAGNOSIS — I110 Hypertensive heart disease with heart failure: Secondary | ICD-10-CM | POA: Diagnosis not present

## 2016-10-11 DIAGNOSIS — I2510 Atherosclerotic heart disease of native coronary artery without angina pectoris: Secondary | ICD-10-CM | POA: Diagnosis not present

## 2016-10-11 DIAGNOSIS — S6291XD Unspecified fracture of right wrist and hand, subsequent encounter for fracture with routine healing: Secondary | ICD-10-CM | POA: Diagnosis not present

## 2016-10-11 DIAGNOSIS — I5032 Chronic diastolic (congestive) heart failure: Secondary | ICD-10-CM | POA: Diagnosis not present

## 2016-10-11 DIAGNOSIS — Z4682 Encounter for fitting and adjustment of non-vascular catheter: Secondary | ICD-10-CM | POA: Diagnosis not present

## 2016-10-12 DIAGNOSIS — I110 Hypertensive heart disease with heart failure: Secondary | ICD-10-CM | POA: Diagnosis not present

## 2016-10-12 DIAGNOSIS — I2510 Atherosclerotic heart disease of native coronary artery without angina pectoris: Secondary | ICD-10-CM | POA: Diagnosis not present

## 2016-10-12 DIAGNOSIS — J90 Pleural effusion, not elsewhere classified: Secondary | ICD-10-CM | POA: Diagnosis not present

## 2016-10-12 DIAGNOSIS — I5032 Chronic diastolic (congestive) heart failure: Secondary | ICD-10-CM | POA: Diagnosis not present

## 2016-10-12 DIAGNOSIS — S6291XD Unspecified fracture of right wrist and hand, subsequent encounter for fracture with routine healing: Secondary | ICD-10-CM | POA: Diagnosis not present

## 2016-10-12 DIAGNOSIS — Z4682 Encounter for fitting and adjustment of non-vascular catheter: Secondary | ICD-10-CM | POA: Diagnosis not present

## 2016-10-14 DIAGNOSIS — S6291XD Unspecified fracture of right wrist and hand, subsequent encounter for fracture with routine healing: Secondary | ICD-10-CM | POA: Diagnosis not present

## 2016-10-14 DIAGNOSIS — I110 Hypertensive heart disease with heart failure: Secondary | ICD-10-CM | POA: Diagnosis not present

## 2016-10-14 DIAGNOSIS — J90 Pleural effusion, not elsewhere classified: Secondary | ICD-10-CM | POA: Diagnosis not present

## 2016-10-14 DIAGNOSIS — I2510 Atherosclerotic heart disease of native coronary artery without angina pectoris: Secondary | ICD-10-CM | POA: Diagnosis not present

## 2016-10-14 DIAGNOSIS — Z4682 Encounter for fitting and adjustment of non-vascular catheter: Secondary | ICD-10-CM | POA: Diagnosis not present

## 2016-10-14 DIAGNOSIS — I5032 Chronic diastolic (congestive) heart failure: Secondary | ICD-10-CM | POA: Diagnosis not present

## 2016-10-18 DIAGNOSIS — I2510 Atherosclerotic heart disease of native coronary artery without angina pectoris: Secondary | ICD-10-CM | POA: Diagnosis not present

## 2016-10-18 DIAGNOSIS — S6291XD Unspecified fracture of right wrist and hand, subsequent encounter for fracture with routine healing: Secondary | ICD-10-CM | POA: Diagnosis not present

## 2016-10-18 DIAGNOSIS — I5032 Chronic diastolic (congestive) heart failure: Secondary | ICD-10-CM | POA: Diagnosis not present

## 2016-10-18 DIAGNOSIS — I110 Hypertensive heart disease with heart failure: Secondary | ICD-10-CM | POA: Diagnosis not present

## 2016-10-18 DIAGNOSIS — J90 Pleural effusion, not elsewhere classified: Secondary | ICD-10-CM | POA: Diagnosis not present

## 2016-10-18 DIAGNOSIS — Z4682 Encounter for fitting and adjustment of non-vascular catheter: Secondary | ICD-10-CM | POA: Diagnosis not present

## 2016-10-21 DIAGNOSIS — I110 Hypertensive heart disease with heart failure: Secondary | ICD-10-CM | POA: Diagnosis not present

## 2016-10-21 DIAGNOSIS — I5032 Chronic diastolic (congestive) heart failure: Secondary | ICD-10-CM | POA: Diagnosis not present

## 2016-10-21 DIAGNOSIS — J90 Pleural effusion, not elsewhere classified: Secondary | ICD-10-CM | POA: Diagnosis not present

## 2016-10-21 DIAGNOSIS — S6291XD Unspecified fracture of right wrist and hand, subsequent encounter for fracture with routine healing: Secondary | ICD-10-CM | POA: Diagnosis not present

## 2016-10-21 DIAGNOSIS — I2510 Atherosclerotic heart disease of native coronary artery without angina pectoris: Secondary | ICD-10-CM | POA: Diagnosis not present

## 2016-10-21 DIAGNOSIS — Z4682 Encounter for fitting and adjustment of non-vascular catheter: Secondary | ICD-10-CM | POA: Diagnosis not present

## 2016-10-22 ENCOUNTER — Telehealth: Payer: Self-pay | Admitting: Internal Medicine

## 2016-10-22 DIAGNOSIS — Z4682 Encounter for fitting and adjustment of non-vascular catheter: Secondary | ICD-10-CM | POA: Diagnosis not present

## 2016-10-22 DIAGNOSIS — I110 Hypertensive heart disease with heart failure: Secondary | ICD-10-CM | POA: Diagnosis not present

## 2016-10-22 DIAGNOSIS — I2510 Atherosclerotic heart disease of native coronary artery without angina pectoris: Secondary | ICD-10-CM | POA: Diagnosis not present

## 2016-10-22 DIAGNOSIS — J90 Pleural effusion, not elsewhere classified: Secondary | ICD-10-CM | POA: Diagnosis not present

## 2016-10-22 DIAGNOSIS — I5032 Chronic diastolic (congestive) heart failure: Secondary | ICD-10-CM | POA: Diagnosis not present

## 2016-10-22 DIAGNOSIS — S6291XD Unspecified fracture of right wrist and hand, subsequent encounter for fracture with routine healing: Secondary | ICD-10-CM | POA: Diagnosis not present

## 2016-10-22 NOTE — Telephone Encounter (Signed)
Spoke with [name] and informed her that she need an ov. [name] was ok with this and made an appointment with Dr. Sherene SiresWert for Wed. Nothing further is needed at this time

## 2016-10-25 DIAGNOSIS — I5032 Chronic diastolic (congestive) heart failure: Secondary | ICD-10-CM | POA: Diagnosis not present

## 2016-10-25 DIAGNOSIS — I2510 Atherosclerotic heart disease of native coronary artery without angina pectoris: Secondary | ICD-10-CM | POA: Diagnosis not present

## 2016-10-25 DIAGNOSIS — Z4682 Encounter for fitting and adjustment of non-vascular catheter: Secondary | ICD-10-CM | POA: Diagnosis not present

## 2016-10-25 DIAGNOSIS — J90 Pleural effusion, not elsewhere classified: Secondary | ICD-10-CM | POA: Diagnosis not present

## 2016-10-25 DIAGNOSIS — I110 Hypertensive heart disease with heart failure: Secondary | ICD-10-CM | POA: Diagnosis not present

## 2016-10-25 DIAGNOSIS — S6291XD Unspecified fracture of right wrist and hand, subsequent encounter for fracture with routine healing: Secondary | ICD-10-CM | POA: Diagnosis not present

## 2016-10-27 ENCOUNTER — Ambulatory Visit (INDEPENDENT_AMBULATORY_CARE_PROVIDER_SITE_OTHER): Payer: Medicare Other | Admitting: Internal Medicine

## 2016-10-27 ENCOUNTER — Encounter: Payer: Self-pay | Admitting: Internal Medicine

## 2016-10-27 ENCOUNTER — Ambulatory Visit (INDEPENDENT_AMBULATORY_CARE_PROVIDER_SITE_OTHER)
Admission: RE | Admit: 2016-10-27 | Discharge: 2016-10-27 | Disposition: A | Discharge status: Home / Self Care | Payer: Medicare Other | Source: Ambulatory Visit | Attending: Internal Medicine | Admitting: Internal Medicine

## 2016-10-27 ENCOUNTER — Other Ambulatory Visit (INDEPENDENT_AMBULATORY_CARE_PROVIDER_SITE_OTHER): Payer: Medicare Other

## 2016-10-27 VITALS — BP 124/72 | HR 63 | Ht 63.0 in | Wt 125.8 lb

## 2016-10-27 DIAGNOSIS — J9611 Chronic respiratory failure with hypoxia: Secondary | ICD-10-CM | POA: Diagnosis not present

## 2016-10-27 DIAGNOSIS — J90 Pleural effusion, not elsewhere classified: Secondary | ICD-10-CM

## 2016-10-27 DIAGNOSIS — I480 Paroxysmal atrial fibrillation: Secondary | ICD-10-CM

## 2016-10-27 DIAGNOSIS — R0602 Shortness of breath: Secondary | ICD-10-CM | POA: Diagnosis not present

## 2016-10-27 LAB — CBC WITH DIFFERENTIAL/PLATELET
BASOS ABS: 0.1 10*3/uL (ref 0.0–0.1)
Basophils Relative: 1.1 % (ref 0.0–3.0)
EOS ABS: 0.1 10*3/uL (ref 0.0–0.7)
Eosinophils Relative: 2.9 % (ref 0.0–5.0)
HCT: 42.4 % (ref 36.0–46.0)
HEMOGLOBIN: 13.8 g/dL (ref 12.0–15.0)
LYMPHS PCT: 22.3 % (ref 12.0–46.0)
Lymphs Abs: 1.1 10*3/uL (ref 0.7–4.0)
MCHC: 32.4 g/dL (ref 30.0–36.0)
MCV: 91.2 fl (ref 78.0–100.0)
MONO ABS: 0.5 10*3/uL (ref 0.1–1.0)
Monocytes Relative: 9.4 % (ref 3.0–12.0)
Neutro Abs: 3.3 10*3/uL (ref 1.4–7.7)
Neutrophils Relative %: 64.3 % (ref 43.0–77.0)
Platelets: 242 10*3/uL (ref 150.0–400.0)
RBC: 4.65 Mil/uL (ref 3.87–5.11)
RDW: 15 % (ref 11.5–15.5)
WBC: 5.1 10*3/uL (ref 4.0–10.5)

## 2016-10-27 LAB — BASIC METABOLIC PANEL
BUN: 19 mg/dL (ref 6–23)
CALCIUM: 10.1 mg/dL (ref 8.4–10.5)
CO2: 27 meq/L (ref 19–32)
CREATININE: 1.31 mg/dL — AB (ref 0.40–1.20)
Chloride: 105 mEq/L (ref 96–112)
GFR: 49.89 mL/min — ABNORMAL LOW (ref >60.00)
GLUCOSE: 125 mg/dL — AB (ref 70–99)
Potassium: 4.1 mEq/L (ref 3.5–5.1)
SODIUM: 138 meq/L (ref 135–145)

## 2016-10-27 LAB — HEPATIC FUNCTION PANEL
ALK PHOS: 91 U/L (ref 39–117)
ALT: 8 U/L (ref 0–35)
AST: 18 U/L (ref 0–37)
Albumin: 3.5 g/dL (ref 3.5–5.2)
BILIRUBIN DIRECT: 0.1 mg/dL (ref 0.0–0.3)
Total Bilirubin: 0.6 mg/dL (ref 0.2–1.2)
Total Protein: 6.3 g/dL (ref 6.0–8.3)

## 2016-10-27 LAB — TSH: TSH: 1.38 u[IU]/mL (ref 0.35–4.50)

## 2016-10-27 LAB — SEDIMENTATION RATE: Sed Rate: 51 mm/hr — ABNORMAL HIGH (ref 0–30)

## 2016-10-27 NOTE — Progress Notes (Signed)
Subjective:     Patient ID: [name], female   DOB: [birth year],  .   MRN: [id number]    History of Present Illness [age] yobf minimal smoking quit around 1970  with seen for pulmonary consult during hospitalization May 2017 with a pneumococcal pneumonia with associated bacteremia and  R effusion / acute hypoxic respiratory failure. She has stage II chronic kidney disease status post renal transplant, diabetes, chronic diastolic congestive heart failure and atrial fib with RVR, on Eliquis.   History of Present Illness  02/12/2016 Pneumonia/Pleural Effusion Follow Up: Pt. Presents  for follow up for pleural effusion and pneumonia diagnosed  12/18/2015. Patient was admitted May 2017 for pneumococcal pneumonia with associated bacteremia and acute hypoxic respiratory failure. Hospitalization was compensated by atrial filb with RVR and NSTEMI demand ischemia. She was seen for pulmonary consult during this hospitalization. She was found to have a large right exudative pleural effusion-felt to be parapneumonic. She underwent thoracentesis times 2 with 1460 cc and then 800cc . Cytology neg for malignant cells.  Initial CT chest on May 23 showed a large right pleural effusion with right lower lobe collapse Repeat CT chest on May 31 showed complete atelectasis of the right lower lobe with significant atelectasis of the right middle lobe. The proximal right middle lobe bronchus appears occluded. Small to moderate right pleural effusion is decreased from previous CT scan. She was treated with antibiotics and diuretics were adjusted. . 2 D echo showed w/ EF 55%, mod LA dilation, mod TV regurg, PAP 44.  She was recently hospitalized July 4th for atrial fib with RVR. She did not want cardioversion. She has been compliant with her Eliquis. CXR today indicates continued atelectasis or infiltrate. Pt. States she continues to have shortness of  breath with activity.She is using her oxygen at 2L [place] 24/7. She is  interested in weaning off the oxygen, but I told her that since she continues to have dyspnea, we need to continue oxygen therapy until she is better.She states she continues to cough up green secretions with some blood. She states she has been coughing up blood since before her first hospital admission. She denies fever, chest pain, or orthopnea, leg or calf pain or tenderness. rec augmentin x 7 more days    03/12/2016  f/u ov/[name] re: s/p pneumocal pna/ sepsis/ R>>L effusions Chief Complaint  Patient presents with  . Follow-up    CXR today. Pt states she has right lateral rib pain when moving. Pt denies pain on left lateral rib side. Pt c/o fatigue. Pt denies cough, f/c/s and CP/tightness.   Overall improved with breathing and no cough or pleuritic cp. She has a new R flank discomfort that is exquistely positional, better on tylenol, "feels like she pullned a muscle with acute onset 3 d prior to OV  And no assoc n or v or anorexia. Sob or fever or rash or change with breath or cough. rec Please remember to go to the lab   department downstairs for your tests - we will call you with the results when they are available. Stay on 02  But reduce to 1lpm    04/26/2016  f/u ov/[name] re:  Chief Complaint  Patient presents with  . Follow-up    CXR done today. Pt states cough has been worse with large amounts of thick, blood streaked sputum for the past wk.   sob x 50 ft  Sleeps on 2 pillows and no worse than usual Cough worse x  one month  Congested sometimes green esp in am  rec Augmentin 875 mg take one pill twice daily  X 10 days -  You will need need to stop the eliquis x 3 days before the thoracentesis done 04/29/16 x 900 cc transudate with good reexpansion of RLL on f/u cxr      05/06/2016  f/u ov/[name] re:  R pleural effusion  Chief Complaint  Patient presents with  . Follow-up    Breathing improved after thoracentesis 04/30/16, and slowly worsening over the past 2 days. She feels very  tired today.   never had orthopnea, either before or after tap. Green mucus resolved but now sltly bloody am mucus on eliquis  rec Please remember to go to the  x-ray department downstairs for your tests - we will call you with the results when they are available. 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 Follow-up    Breathing is unchanged. CXR repeated today.    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 Acute Visit    Pt c/o increased DOE for the past wk, esp worse for the past 2-3 days. She states that she gets SOB "every time I move".  She states that she felt a "pinching feeling" in her chest a few times this am. She also c/o nosebleeds recently.    gradually worse sob/ orthopnea since last ov with minimal peripheral edema on rx per renal and set up for T surgery eval for ? Vats in one week - still comfortable on rest on 02  rec Stop the eliquis for now Please see patient coordinator before you leave today  to  schedule Thoracentesis 07/01/16  - R t centesis x 1.2 liters/ transudate  Wbc 236 with L > P and nl Trig/ cytology neg    R Pleurex  Placed 07/20/16 by Dr Zenaida Niece Tright    10/27/2016  f/u ov/Rivaldo Hineman re:  R effusion/ chronic hypoxemic RF improved since pleurex  Chief Complaint  Patient presents with  . Acute Visit    Here to recertify for oxygen,    last pulled off R fluid 3/19 x 1100 cc   Feels weak/ fatigued but no longer sob, minimal cough day > noct but s excess/ purulent sputum or mucus plugs    No obvious day to day or daytime variability or assoc   cp or chest tightness, subjective wheeze or overt  sinus or hb symptoms. No unusual exp hx or h/o childhood pna/ asthma or knowledge of premature birth.  Sleeping ok without nocturnal  or early am exacerbation  of respiratory  c/o's or need for noct saba. Also denies any obvious fluctuation of symptoms with weather or environmental changes or other aggravating or alleviating factors except as outlined above   Current Medications, Allergies, Complete Past Medical History, Past Surgical History, Family History, and Social History were reviewed in [facility] electronic medical record.  ROS  The following are not active complaints unless bolded sore throat, dysphagia, dental problems, itching, sneezing,  nasal congestion or excess/ purulent secretions, ear ache,   fever, chills, sweats, unintended wt loss, classically pleuritic or exertional cp, hemoptysis,  orthopnea pnd or leg swelling, presyncope, palpitations, abdominal pain, anorexia, nausea, vomiting, diarrhea  or change in bowel or bladder habits, change in stools or urine, dysuria,hematuria,  rash, arthralgias, visual complaints, headache, numbness, weakness or ataxia or problems with walking or coordination,  change in mood/affect or memory.                    Objective:   Physical Exam    W/c bound elderly bf = stated age in appearance  05/06/2016       151   03/12/16 154 lb  3.2 oz (69.9 kg)  03/01/16 156 lb (70.8 kg)  02/12/16 156 lb (70.8 kg)    Vital signs reviewed - 02 sats 98% on 2lpm   General- No distress,  A&Ox3, frail elderly female in a wheel chair. ENT: No sinus tenderness, TM clear, pale nasal mucosa, no oral exudate,no post nasal drip, no LAN Cardiac: S1, S2, regular rate and rhythm, no murmur Chest: No wheeze/ rales/ diminished  Base on R   with dullnes ; no accessory muscle use, no nasal flaring, no sternal retractions Abd.: Soft Non-tender Ext: No clubbing cyanosis, - trace  sym pitting ankle edema  Neuro:  normal strength Skin: No rashes, warm and dry   Labs ordered/ reviewed:      Chemistry      Component Value Date/Time   NA 138 10/27/2016 1222   NA 138 01/19/2016 1155   K 4.1 10/27/2016 1222   CL 105 10/27/2016 1222   CO2 27 10/27/2016 1222   BUN 19 10/27/2016 1222   BUN 22 01/19/2016 1155   CREATININE 1.31 (H) 10/27/2016 1222      Component Value Date/Time   CALCIUM 10.1 10/27/2016 1222   ALKPHOS 91 10/27/2016 1222   AST 18 10/27/2016 1222   ALT 8 10/27/2016 1222   BILITOT 0.6 10/27/2016 1222        Lab Results  Component Value Date   WBC 5.1 10/27/2016   HGB 13.8 10/27/2016   HCT 42.4 10/27/2016   MCV 91.2 10/27/2016   PLT 242.0 10/27/2016         Lab Results  Component Value Date   TSH 1.38 10/27/2016         Lab Results  Component Value Date   ESRSEDRATE 51 (H) 10/27/2016   ESRSEDRATE 37 (H) 06/03/2016   ESRSEDRATE 93 (H) 03/12/2016          I personally reviewed images and agree with radiology impression as follows:  CXR:   No change. Chronic cardiomegaly. Right chest tube remains in place. Chronic bilateral pleural effusions with dependent atelectasis. Basilar pneumonia not excluded by radiography.       Assessment:

## 2016-10-27 NOTE — Patient Instructions (Signed)
Please remember to go to the lab and x-ray department downstairs in the basement  for your tests - we will call you with the results when they are available.     We will have you checked for 02 overnight saturation off of 02 and not right after the fluid is pulled off

## 2016-10-29 DIAGNOSIS — J90 Pleural effusion, not elsewhere classified: Secondary | ICD-10-CM | POA: Diagnosis not present

## 2016-10-29 DIAGNOSIS — I5032 Chronic diastolic (congestive) heart failure: Secondary | ICD-10-CM | POA: Diagnosis not present

## 2016-10-29 DIAGNOSIS — I2510 Atherosclerotic heart disease of native coronary artery without angina pectoris: Secondary | ICD-10-CM | POA: Diagnosis not present

## 2016-10-29 DIAGNOSIS — Z4682 Encounter for fitting and adjustment of non-vascular catheter: Secondary | ICD-10-CM | POA: Diagnosis not present

## 2016-10-29 DIAGNOSIS — I110 Hypertensive heart disease with heart failure: Secondary | ICD-10-CM | POA: Diagnosis not present

## 2016-10-29 DIAGNOSIS — S6291XD Unspecified fracture of right wrist and hand, subsequent encounter for fracture with routine healing: Secondary | ICD-10-CM | POA: Diagnosis not present

## 2016-10-31 ENCOUNTER — Encounter: Payer: Self-pay | Admitting: Internal Medicine

## 2016-10-31 NOTE — Assessment & Plan Note (Signed)
rx with 02 at d/c from strep pna May 2017  =As of 03/12/2016 ok on 1lpm 24/7  - 06/11/2016 sats ok RA at rest > rec continue 02 at hs only and prn daytime - 10/27/2016   Walked RA x one lap @ 185 stopped due to  Nausea/ weak not sob or desat > rec ono RA next but no amb 02

## 2016-10-31 NOTE — Assessment & Plan Note (Signed)
12/19/2015>>2 D echo showed w/ EF 55%, mod LA dilation, mod TV regurg, PAP 44.  12/23/15>> CT scan on 5/23 showed showed a large right pleural effusion with right lower lobe collapse and patchy airspace disease of the left lower lobe, finding suspicion for aspiration pneumonia, endobronchial lesion causing collapse of the right lower lobe is also in the differential diagnosis    12/24/15>> R Thorocentesis>> 1640 cc clear yellow, most likely exudative by LDH. ( 10 days IV Rocephin) 12/28/15>> Reaccumulation>> R parapneumonic effusion and had another 868mL's of pleural fluid removed( ABX resumed) 12/31/15>> Repeat CT scan of the chest without contrast on 5/31 showed no convincing right lower lobe mass, small to moderate right pleural effusion. Pulmonary recommended aggressive diuresis to help manage her pleural effusion Patient d/c  another 5 days of Augmentin. 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 > 05/17/2016 rec tap dry and f/u with CT chest without contrast  - 05/20/2016  R tcentesis x 950 cc then CT chest > done 05/21/2016 and c/w as dz > rec amoxicillin 500 tid x 2 weeks then ov to regroup  - 07/01/16  R t centesis x 1.2 liters/ transudate  Wbc 236 with L > P and nl Trig/ cytology neg  R Pleurex  Placed 07/20/16 by Dr Morton Peters  Much better resp function but does not really feel good overall > note that's not surprising in the setting of a transudative process as we haven't solved the underlying problem which is hydrostatic or oncotic but not primarily a lung problem so f/u here can be prn    I had  an extended final summary discussion with the patient reviewing all relevant studies completed to date and  lasting 15 to 20 minutes of a 25 minute visit on the following issues:    Each maintenance medication was reviewed in detail including most importantly the difference between maintenance and as needed and under what circumstances the prns are to be used.  Please see AVS for specific  Instructions which are unique to this visit and I personally typed out  which were reviewed in detail in writing with the patient and a copy provided.

## 2016-11-01 DIAGNOSIS — I110 Hypertensive heart disease with heart failure: Secondary | ICD-10-CM | POA: Diagnosis not present

## 2016-11-01 DIAGNOSIS — I2510 Atherosclerotic heart disease of native coronary artery without angina pectoris: Secondary | ICD-10-CM | POA: Diagnosis not present

## 2016-11-01 DIAGNOSIS — I5032 Chronic diastolic (congestive) heart failure: Secondary | ICD-10-CM | POA: Diagnosis not present

## 2016-11-01 DIAGNOSIS — S6291XD Unspecified fracture of right wrist and hand, subsequent encounter for fracture with routine healing: Secondary | ICD-10-CM | POA: Diagnosis not present

## 2016-11-01 DIAGNOSIS — Z4682 Encounter for fitting and adjustment of non-vascular catheter: Secondary | ICD-10-CM | POA: Diagnosis not present

## 2016-11-01 DIAGNOSIS — J90 Pleural effusion, not elsewhere classified: Secondary | ICD-10-CM | POA: Diagnosis not present

## 2016-11-05 DIAGNOSIS — S6291XD Unspecified fracture of right wrist and hand, subsequent encounter for fracture with routine healing: Secondary | ICD-10-CM | POA: Diagnosis not present

## 2016-11-05 DIAGNOSIS — J90 Pleural effusion, not elsewhere classified: Secondary | ICD-10-CM | POA: Diagnosis not present

## 2016-11-05 DIAGNOSIS — Z4682 Encounter for fitting and adjustment of non-vascular catheter: Secondary | ICD-10-CM | POA: Diagnosis not present

## 2016-11-05 DIAGNOSIS — I2510 Atherosclerotic heart disease of native coronary artery without angina pectoris: Secondary | ICD-10-CM | POA: Diagnosis not present

## 2016-11-05 DIAGNOSIS — I110 Hypertensive heart disease with heart failure: Secondary | ICD-10-CM | POA: Diagnosis not present

## 2016-11-05 DIAGNOSIS — I5032 Chronic diastolic (congestive) heart failure: Secondary | ICD-10-CM | POA: Diagnosis not present

## 2016-11-08 DIAGNOSIS — J90 Pleural effusion, not elsewhere classified: Secondary | ICD-10-CM | POA: Diagnosis not present

## 2016-11-08 DIAGNOSIS — S6291XD Unspecified fracture of right wrist and hand, subsequent encounter for fracture with routine healing: Secondary | ICD-10-CM | POA: Diagnosis not present

## 2016-11-08 DIAGNOSIS — I110 Hypertensive heart disease with heart failure: Secondary | ICD-10-CM | POA: Diagnosis not present

## 2016-11-08 DIAGNOSIS — I2510 Atherosclerotic heart disease of native coronary artery without angina pectoris: Secondary | ICD-10-CM | POA: Diagnosis not present

## 2016-11-08 DIAGNOSIS — I5032 Chronic diastolic (congestive) heart failure: Secondary | ICD-10-CM | POA: Diagnosis not present

## 2016-11-08 DIAGNOSIS — Z4682 Encounter for fitting and adjustment of non-vascular catheter: Secondary | ICD-10-CM | POA: Diagnosis not present

## 2016-11-12 DIAGNOSIS — Z4682 Encounter for fitting and adjustment of non-vascular catheter: Secondary | ICD-10-CM | POA: Diagnosis not present

## 2016-11-12 DIAGNOSIS — J90 Pleural effusion, not elsewhere classified: Secondary | ICD-10-CM | POA: Diagnosis not present

## 2016-11-12 DIAGNOSIS — I5032 Chronic diastolic (congestive) heart failure: Secondary | ICD-10-CM | POA: Diagnosis not present

## 2016-11-12 DIAGNOSIS — I110 Hypertensive heart disease with heart failure: Secondary | ICD-10-CM | POA: Diagnosis not present

## 2016-11-12 DIAGNOSIS — I2510 Atherosclerotic heart disease of native coronary artery without angina pectoris: Secondary | ICD-10-CM | POA: Diagnosis not present

## 2016-11-12 DIAGNOSIS — S6291XD Unspecified fracture of right wrist and hand, subsequent encounter for fracture with routine healing: Secondary | ICD-10-CM | POA: Diagnosis not present

## 2016-11-15 DIAGNOSIS — J90 Pleural effusion, not elsewhere classified: Secondary | ICD-10-CM | POA: Diagnosis not present

## 2016-11-15 DIAGNOSIS — I110 Hypertensive heart disease with heart failure: Secondary | ICD-10-CM | POA: Diagnosis not present

## 2016-11-15 DIAGNOSIS — I5032 Chronic diastolic (congestive) heart failure: Secondary | ICD-10-CM | POA: Diagnosis not present

## 2016-11-15 DIAGNOSIS — Z4682 Encounter for fitting and adjustment of non-vascular catheter: Secondary | ICD-10-CM | POA: Diagnosis not present

## 2016-11-15 DIAGNOSIS — S6291XD Unspecified fracture of right wrist and hand, subsequent encounter for fracture with routine healing: Secondary | ICD-10-CM | POA: Diagnosis not present

## 2016-11-15 DIAGNOSIS — I2510 Atherosclerotic heart disease of native coronary artery without angina pectoris: Secondary | ICD-10-CM | POA: Diagnosis not present

## 2016-11-16 ENCOUNTER — Other Ambulatory Visit: Payer: Self-pay | Admitting: Cardiothoracic Surgery

## 2016-11-16 DIAGNOSIS — J90 Pleural effusion, not elsewhere classified: Secondary | ICD-10-CM

## 2016-11-16 DIAGNOSIS — R5383 Other fatigue: Secondary | ICD-10-CM | POA: Diagnosis not present

## 2016-11-16 DIAGNOSIS — E663 Overweight: Secondary | ICD-10-CM | POA: Diagnosis not present

## 2016-11-16 DIAGNOSIS — I10 Essential (primary) hypertension: Secondary | ICD-10-CM | POA: Diagnosis not present

## 2016-11-16 DIAGNOSIS — E212 Other hyperparathyroidism: Secondary | ICD-10-CM | POA: Diagnosis not present

## 2016-11-16 DIAGNOSIS — N183 Chronic kidney disease, stage 3 (moderate): Secondary | ICD-10-CM | POA: Diagnosis not present

## 2016-11-16 DIAGNOSIS — Z940 Kidney transplant status: Secondary | ICD-10-CM | POA: Diagnosis not present

## 2016-11-17 ENCOUNTER — Encounter: Payer: Self-pay | Admitting: Cardiothoracic Surgery

## 2016-11-17 ENCOUNTER — Ambulatory Visit
Admission: RE | Admit: 2016-11-17 | Discharge: 2016-11-17 | Disposition: A | Discharge status: Home / Self Care | Payer: Medicare Other | Source: Ambulatory Visit | Attending: Cardiothoracic Surgery | Admitting: Cardiothoracic Surgery

## 2016-11-17 ENCOUNTER — Ambulatory Visit (INDEPENDENT_AMBULATORY_CARE_PROVIDER_SITE_OTHER): Payer: Medicare Other | Admitting: Cardiothoracic Surgery

## 2016-11-17 VITALS — BP 126/72 | HR 62 | Resp 18 | Ht 63.0 in | Wt 125.0 lb

## 2016-11-17 DIAGNOSIS — J90 Pleural effusion, not elsewhere classified: Secondary | ICD-10-CM

## 2016-11-17 DIAGNOSIS — Z9689 Presence of other specified functional implants: Secondary | ICD-10-CM | POA: Diagnosis not present

## 2016-11-17 NOTE — Progress Notes (Signed)
PCP is Gara Kroner, MD Referring Provider is Nyoka Cowden, MD  Chief Complaint  Patient presents with  . Routine Post Op    6 wk f/u with a CXR to assess R PL EFF/pleurX    HPI: Scheduled visit for right Pleurx catheter placed December 2017 nonmalignant disease, chronic renal insufficiency status post transplant Catheter is working well and has drained every other day approximately 800 cc Chest x-ray shows minimal right pleural effusion today prior to drainage of 900 cc in office after x-ray Catheter site is clean and dry-no infection Patient would benefit from daily catheter drainage if she can work this out with her family as she lives alone. [facility] She was recently placed on oral Lasix by her nephrologist.  Past Medical History:  Diagnosis Date  . Candidal esophagitis (HCC)    a. 03/2014.  Marland Kitchen Cholelithiasis   . Chronic diastolic CHF (congestive heart failure) (HCC)    a. 06/2011 Echo: EF 60-65%, no rwma, Gr1 DD, mild TR. // b. Echo 12/19/15: Severe LVH, EF 55-60%, normal wall motion, mild MR, moderate LAE, moderate TR, PASP 44 mmHg  . CKD (chronic kidney disease), stage III    a. in setting of prior ESRD and cadaveric tx in 2003.  Marland Kitchen Coronary artery disease    a. 01/2001 Cath/PCI: LAD 50p, 16m, D1 50-60, RCA 29m (3.0x13 BX Velocity BMS).// b. NSTEMI 5/17 (demand ischemia) in setting of AF with RVR, pneumonia, CKD, a/c HF >> Myoview 12/26/15: prob inf infarct, no ischemia, EF 52%, Low Risk >> med Rx  . Gastritis    a. 03/2014  . Gout    unconfirmed by joint aspiration  . History of bacteremia    a. 08/2004 Group A Strep bacteremia.  . Hypertensive heart disease   . S/P kidney transplant    a. due to FSGN, follows with Dr. Vivia Birmingham in 2003. Was on HD prior to that.  . Type II diabetes mellitus (HCC)     Past Surgical History:  Procedure Laterality Date  . ABDOMINAL HYSTERECTOMY    . CHEST TUBE INSERTION  Right 07/20/2016   Procedure: INSERTION RIGHT PLEURAL DRAINAGE CATHETER;  Surgeon: Kerin Perna, MD;  Location: [facility] OR;  Service: Thoracic;  Laterality: Right;  . ESOPHAGOGASTRODUODENOSCOPY N/A 03/20/2014   Procedure: ESOPHAGOGASTRODUODENOSCOPY (EGD);  Surgeon: Theda Belfast, MD;  Location: [facility] ENDOSCOPY;  Service: Endoscopy;  Laterality: N/A;  . KIDNEY TRANSPLANT  2003  . VIDEO BRONCHOSCOPY Bilateral 05/12/2016   Procedure: VIDEO BRONCHOSCOPY WITHOUT FLUORO;  Surgeon: Nyoka Cowden, MD;  Location: [facility] ENDOSCOPY;  Service: Cardiopulmonary;  Laterality: Bilateral;    Family History  Problem Relation Age of Onset  . CAD Father   . CAD Brother   . CAD      Both parents and multiple siblings have died from coronary disease prior to age 51 and several in their 33's and 36's.    Social History Social History  Substance Use Topics  . Smoking status: Former Smoker    Quit date: 04/19/1969  . Smokeless tobacco: Never Used  . Alcohol use No    Current Outpatient Prescriptions  Medication Sig Dispense Refill  . amiodarone (PACERONE) 200 MG tablet Take 1 tablet (200 mg total) by mouth 2 (two) times daily. 60 tablet 12  . amLODipine (NORVASC) 10 MG tablet Take 5 mg by mouth daily.    Marland Kitchen apixaban (ELIQUIS) 2.5 MG TABS tablet Take 1 tablet (2.5 mg  total) by mouth 2 (two) times daily. Cancel previous Eliquis prescription 60 tablet 3  . aspirin EC 81 MG tablet Take 81 mg by mouth daily.    . cinacalcet (SENSIPAR) 90 MG tablet Take 22.5 mg by mouth See admin instructions. Once every one to two weeks    . famotidine (PEPCID) 20 MG tablet Take 20 mg by mouth 2 (two) times daily.    . furosemide (LASIX) 40 MG tablet Take 40 mg by mouth 2 (two) times daily.    . mycophenolate (CELLCEPT) 250 MG capsule Take 500 mg by mouth 2 (two) times daily.    . ondansetron (ZOFRAN) 4 MG tablet Take 1 tablet (4 mg total) by mouth daily as needed for nausea or vomiting. 15 tablet 0  . OXYGEN Inhale into the lungs.  2lpm 24/7    . tacrolimus (PROGRAF) 0.5 MG capsule 1 mg in the morning and 1 mg at bedtime (Patient taking differently: Take 2-2.5 mg by mouth See admin instructions. Take 2.5 mg by mouth in the morning and take 2 mg by mouth at night.) 120 capsule 0   No current facility-administered medications for this visit.     Allergies  Allergen Reactions  . Sulfa Drugs Cross Reactors Swelling    Review of Systems  No fever She states her weight is been stable Lower ankle edema for which she was placed on Lasix  BP 126/72 (BP Location: Right Arm, Patient Position: Sitting, Cuff Size: Large)   Pulse 62   Resp 18   Ht  (1.6 m)   Wt 125 lb (56.7 kg)   SpO2 92% Comment: ON RA  BMI 22.14 kg/m  Physical Exam      Exam    General- alert and comfortable   Lungs- clear without rales, wheezes. Right Pleurx catheter site clean and dry   Cor- regular rate and rhythm, no murmur , gallop   Abdomen- soft, non-tender   Extremities - warm, non-tender, moderate bilateral ankle edema   Neuro- oriented, appropriate, no focal weakness   Diagnostic Tests: Chest x-ray personally reviewed showing Rex catheter in good position with small right pleural effusion prior to drainage and office  Impression: Continue catheter drainage of nonmalignant) pleural effusion Increase to daily drainages schedule if possible  Plan: Return in 2 months with chest x-ray for review  Mikey Bussing, MD [facility] [phone]

## 2016-11-18 DIAGNOSIS — J90 Pleural effusion, not elsewhere classified: Secondary | ICD-10-CM | POA: Diagnosis not present

## 2016-11-18 DIAGNOSIS — S6291XD Unspecified fracture of right wrist and hand, subsequent encounter for fracture with routine healing: Secondary | ICD-10-CM | POA: Diagnosis not present

## 2016-11-18 DIAGNOSIS — I2510 Atherosclerotic heart disease of native coronary artery without angina pectoris: Secondary | ICD-10-CM | POA: Diagnosis not present

## 2016-11-18 DIAGNOSIS — Z4682 Encounter for fitting and adjustment of non-vascular catheter: Secondary | ICD-10-CM | POA: Diagnosis not present

## 2016-11-18 DIAGNOSIS — I110 Hypertensive heart disease with heart failure: Secondary | ICD-10-CM | POA: Diagnosis not present

## 2016-11-18 DIAGNOSIS — I5032 Chronic diastolic (congestive) heart failure: Secondary | ICD-10-CM | POA: Diagnosis not present

## 2016-11-19 DIAGNOSIS — Z940 Kidney transplant status: Secondary | ICD-10-CM | POA: Diagnosis not present

## 2016-11-19 DIAGNOSIS — W19XXXD Unspecified fall, subsequent encounter: Secondary | ICD-10-CM | POA: Diagnosis not present

## 2016-11-19 DIAGNOSIS — I110 Hypertensive heart disease with heart failure: Secondary | ICD-10-CM | POA: Diagnosis not present

## 2016-11-19 DIAGNOSIS — J9611 Chronic respiratory failure with hypoxia: Secondary | ICD-10-CM | POA: Diagnosis not present

## 2016-11-19 DIAGNOSIS — I5032 Chronic diastolic (congestive) heart failure: Secondary | ICD-10-CM | POA: Diagnosis not present

## 2016-11-19 DIAGNOSIS — S6291XD Unspecified fracture of right wrist and hand, subsequent encounter for fracture with routine healing: Secondary | ICD-10-CM | POA: Diagnosis not present

## 2016-11-19 DIAGNOSIS — I2510 Atherosclerotic heart disease of native coronary artery without angina pectoris: Secondary | ICD-10-CM | POA: Diagnosis not present

## 2016-11-19 DIAGNOSIS — J449 Chronic obstructive pulmonary disease, unspecified: Secondary | ICD-10-CM | POA: Diagnosis not present

## 2016-11-19 DIAGNOSIS — Z4682 Encounter for fitting and adjustment of non-vascular catheter: Secondary | ICD-10-CM | POA: Diagnosis not present

## 2016-11-19 DIAGNOSIS — Z7982 Long term (current) use of aspirin: Secondary | ICD-10-CM | POA: Diagnosis not present

## 2016-11-19 DIAGNOSIS — J90 Pleural effusion, not elsewhere classified: Secondary | ICD-10-CM | POA: Diagnosis not present

## 2016-11-22 DIAGNOSIS — I5032 Chronic diastolic (congestive) heart failure: Secondary | ICD-10-CM | POA: Diagnosis not present

## 2016-11-22 DIAGNOSIS — I2510 Atherosclerotic heart disease of native coronary artery without angina pectoris: Secondary | ICD-10-CM | POA: Diagnosis not present

## 2016-11-22 DIAGNOSIS — J90 Pleural effusion, not elsewhere classified: Secondary | ICD-10-CM | POA: Diagnosis not present

## 2016-11-22 DIAGNOSIS — S6291XD Unspecified fracture of right wrist and hand, subsequent encounter for fracture with routine healing: Secondary | ICD-10-CM | POA: Diagnosis not present

## 2016-11-22 DIAGNOSIS — Z4682 Encounter for fitting and adjustment of non-vascular catheter: Secondary | ICD-10-CM | POA: Diagnosis not present

## 2016-11-22 DIAGNOSIS — I110 Hypertensive heart disease with heart failure: Secondary | ICD-10-CM | POA: Diagnosis not present

## 2016-11-23 DIAGNOSIS — I2510 Atherosclerotic heart disease of native coronary artery without angina pectoris: Secondary | ICD-10-CM | POA: Diagnosis not present

## 2016-11-23 DIAGNOSIS — I110 Hypertensive heart disease with heart failure: Secondary | ICD-10-CM | POA: Diagnosis not present

## 2016-11-23 DIAGNOSIS — Z4682 Encounter for fitting and adjustment of non-vascular catheter: Secondary | ICD-10-CM | POA: Diagnosis not present

## 2016-11-23 DIAGNOSIS — I5032 Chronic diastolic (congestive) heart failure: Secondary | ICD-10-CM | POA: Diagnosis not present

## 2016-11-23 DIAGNOSIS — J90 Pleural effusion, not elsewhere classified: Secondary | ICD-10-CM | POA: Diagnosis not present

## 2016-11-23 DIAGNOSIS — S6291XD Unspecified fracture of right wrist and hand, subsequent encounter for fracture with routine healing: Secondary | ICD-10-CM | POA: Diagnosis not present

## 2016-11-25 DIAGNOSIS — J90 Pleural effusion, not elsewhere classified: Secondary | ICD-10-CM | POA: Diagnosis not present

## 2016-11-25 DIAGNOSIS — I110 Hypertensive heart disease with heart failure: Secondary | ICD-10-CM | POA: Diagnosis not present

## 2016-11-25 DIAGNOSIS — I2510 Atherosclerotic heart disease of native coronary artery without angina pectoris: Secondary | ICD-10-CM | POA: Diagnosis not present

## 2016-11-25 DIAGNOSIS — S6291XD Unspecified fracture of right wrist and hand, subsequent encounter for fracture with routine healing: Secondary | ICD-10-CM | POA: Diagnosis not present

## 2016-11-25 DIAGNOSIS — Z4682 Encounter for fitting and adjustment of non-vascular catheter: Secondary | ICD-10-CM | POA: Diagnosis not present

## 2016-11-25 DIAGNOSIS — I5032 Chronic diastolic (congestive) heart failure: Secondary | ICD-10-CM | POA: Diagnosis not present

## 2016-11-26 DIAGNOSIS — Z4682 Encounter for fitting and adjustment of non-vascular catheter: Secondary | ICD-10-CM | POA: Diagnosis not present

## 2016-11-26 DIAGNOSIS — I5032 Chronic diastolic (congestive) heart failure: Secondary | ICD-10-CM | POA: Diagnosis not present

## 2016-11-26 DIAGNOSIS — I110 Hypertensive heart disease with heart failure: Secondary | ICD-10-CM | POA: Diagnosis not present

## 2016-11-26 DIAGNOSIS — I2510 Atherosclerotic heart disease of native coronary artery without angina pectoris: Secondary | ICD-10-CM | POA: Diagnosis not present

## 2016-11-26 DIAGNOSIS — S6291XD Unspecified fracture of right wrist and hand, subsequent encounter for fracture with routine healing: Secondary | ICD-10-CM | POA: Diagnosis not present

## 2016-11-26 DIAGNOSIS — J90 Pleural effusion, not elsewhere classified: Secondary | ICD-10-CM | POA: Diagnosis not present

## 2016-11-29 DIAGNOSIS — S6291XD Unspecified fracture of right wrist and hand, subsequent encounter for fracture with routine healing: Secondary | ICD-10-CM | POA: Diagnosis not present

## 2016-11-29 DIAGNOSIS — I110 Hypertensive heart disease with heart failure: Secondary | ICD-10-CM | POA: Diagnosis not present

## 2016-11-29 DIAGNOSIS — I2510 Atherosclerotic heart disease of native coronary artery without angina pectoris: Secondary | ICD-10-CM | POA: Diagnosis not present

## 2016-11-29 DIAGNOSIS — Z4682 Encounter for fitting and adjustment of non-vascular catheter: Secondary | ICD-10-CM | POA: Diagnosis not present

## 2016-11-29 DIAGNOSIS — I5032 Chronic diastolic (congestive) heart failure: Secondary | ICD-10-CM | POA: Diagnosis not present

## 2016-11-29 DIAGNOSIS — J90 Pleural effusion, not elsewhere classified: Secondary | ICD-10-CM | POA: Diagnosis not present

## 2016-11-30 DIAGNOSIS — I5032 Chronic diastolic (congestive) heart failure: Secondary | ICD-10-CM | POA: Diagnosis not present

## 2016-11-30 DIAGNOSIS — J90 Pleural effusion, not elsewhere classified: Secondary | ICD-10-CM | POA: Diagnosis not present

## 2016-11-30 DIAGNOSIS — I2510 Atherosclerotic heart disease of native coronary artery without angina pectoris: Secondary | ICD-10-CM | POA: Diagnosis not present

## 2016-11-30 DIAGNOSIS — I110 Hypertensive heart disease with heart failure: Secondary | ICD-10-CM | POA: Diagnosis not present

## 2016-11-30 DIAGNOSIS — S6291XD Unspecified fracture of right wrist and hand, subsequent encounter for fracture with routine healing: Secondary | ICD-10-CM | POA: Diagnosis not present

## 2016-11-30 DIAGNOSIS — Z4682 Encounter for fitting and adjustment of non-vascular catheter: Secondary | ICD-10-CM | POA: Diagnosis not present

## 2016-12-02 DIAGNOSIS — J90 Pleural effusion, not elsewhere classified: Secondary | ICD-10-CM | POA: Diagnosis not present

## 2016-12-02 DIAGNOSIS — I110 Hypertensive heart disease with heart failure: Secondary | ICD-10-CM | POA: Diagnosis not present

## 2016-12-02 DIAGNOSIS — I2510 Atherosclerotic heart disease of native coronary artery without angina pectoris: Secondary | ICD-10-CM | POA: Diagnosis not present

## 2016-12-02 DIAGNOSIS — Z4682 Encounter for fitting and adjustment of non-vascular catheter: Secondary | ICD-10-CM | POA: Diagnosis not present

## 2016-12-02 DIAGNOSIS — I5032 Chronic diastolic (congestive) heart failure: Secondary | ICD-10-CM | POA: Diagnosis not present

## 2016-12-02 DIAGNOSIS — S6291XD Unspecified fracture of right wrist and hand, subsequent encounter for fracture with routine healing: Secondary | ICD-10-CM | POA: Diagnosis not present

## 2016-12-06 DIAGNOSIS — I110 Hypertensive heart disease with heart failure: Secondary | ICD-10-CM | POA: Diagnosis not present

## 2016-12-06 DIAGNOSIS — Z4682 Encounter for fitting and adjustment of non-vascular catheter: Secondary | ICD-10-CM | POA: Diagnosis not present

## 2016-12-06 DIAGNOSIS — I2510 Atherosclerotic heart disease of native coronary artery without angina pectoris: Secondary | ICD-10-CM | POA: Diagnosis not present

## 2016-12-06 DIAGNOSIS — S6291XD Unspecified fracture of right wrist and hand, subsequent encounter for fracture with routine healing: Secondary | ICD-10-CM | POA: Diagnosis not present

## 2016-12-06 DIAGNOSIS — I5032 Chronic diastolic (congestive) heart failure: Secondary | ICD-10-CM | POA: Diagnosis not present

## 2016-12-06 DIAGNOSIS — J90 Pleural effusion, not elsewhere classified: Secondary | ICD-10-CM | POA: Diagnosis not present

## 2016-12-10 DIAGNOSIS — J90 Pleural effusion, not elsewhere classified: Secondary | ICD-10-CM | POA: Diagnosis not present

## 2016-12-10 DIAGNOSIS — I2510 Atherosclerotic heart disease of native coronary artery without angina pectoris: Secondary | ICD-10-CM | POA: Diagnosis not present

## 2016-12-10 DIAGNOSIS — I5032 Chronic diastolic (congestive) heart failure: Secondary | ICD-10-CM | POA: Diagnosis not present

## 2016-12-10 DIAGNOSIS — I110 Hypertensive heart disease with heart failure: Secondary | ICD-10-CM | POA: Diagnosis not present

## 2016-12-10 DIAGNOSIS — S6291XD Unspecified fracture of right wrist and hand, subsequent encounter for fracture with routine healing: Secondary | ICD-10-CM | POA: Diagnosis not present

## 2016-12-10 DIAGNOSIS — Z4682 Encounter for fitting and adjustment of non-vascular catheter: Secondary | ICD-10-CM | POA: Diagnosis not present

## 2016-12-13 DIAGNOSIS — S6291XD Unspecified fracture of right wrist and hand, subsequent encounter for fracture with routine healing: Secondary | ICD-10-CM | POA: Diagnosis not present

## 2016-12-13 DIAGNOSIS — I5032 Chronic diastolic (congestive) heart failure: Secondary | ICD-10-CM | POA: Diagnosis not present

## 2016-12-13 DIAGNOSIS — J90 Pleural effusion, not elsewhere classified: Secondary | ICD-10-CM | POA: Diagnosis not present

## 2016-12-13 DIAGNOSIS — I110 Hypertensive heart disease with heart failure: Secondary | ICD-10-CM | POA: Diagnosis not present

## 2016-12-13 DIAGNOSIS — I2510 Atherosclerotic heart disease of native coronary artery without angina pectoris: Secondary | ICD-10-CM | POA: Diagnosis not present

## 2016-12-13 DIAGNOSIS — Z4682 Encounter for fitting and adjustment of non-vascular catheter: Secondary | ICD-10-CM | POA: Diagnosis not present

## 2016-12-17 DIAGNOSIS — I5032 Chronic diastolic (congestive) heart failure: Secondary | ICD-10-CM | POA: Diagnosis not present

## 2016-12-17 DIAGNOSIS — Z4682 Encounter for fitting and adjustment of non-vascular catheter: Secondary | ICD-10-CM | POA: Diagnosis not present

## 2016-12-17 DIAGNOSIS — S6291XD Unspecified fracture of right wrist and hand, subsequent encounter for fracture with routine healing: Secondary | ICD-10-CM | POA: Diagnosis not present

## 2016-12-17 DIAGNOSIS — J90 Pleural effusion, not elsewhere classified: Secondary | ICD-10-CM | POA: Diagnosis not present

## 2016-12-17 DIAGNOSIS — I2510 Atherosclerotic heart disease of native coronary artery without angina pectoris: Secondary | ICD-10-CM | POA: Diagnosis not present

## 2016-12-17 DIAGNOSIS — I110 Hypertensive heart disease with heart failure: Secondary | ICD-10-CM | POA: Diagnosis not present

## 2016-12-20 DIAGNOSIS — S6291XD Unspecified fracture of right wrist and hand, subsequent encounter for fracture with routine healing: Secondary | ICD-10-CM | POA: Diagnosis not present

## 2016-12-20 DIAGNOSIS — J90 Pleural effusion, not elsewhere classified: Secondary | ICD-10-CM | POA: Diagnosis not present

## 2016-12-20 DIAGNOSIS — I110 Hypertensive heart disease with heart failure: Secondary | ICD-10-CM | POA: Diagnosis not present

## 2016-12-20 DIAGNOSIS — I2510 Atherosclerotic heart disease of native coronary artery without angina pectoris: Secondary | ICD-10-CM | POA: Diagnosis not present

## 2016-12-20 DIAGNOSIS — I5032 Chronic diastolic (congestive) heart failure: Secondary | ICD-10-CM | POA: Diagnosis not present

## 2016-12-20 DIAGNOSIS — Z4682 Encounter for fitting and adjustment of non-vascular catheter: Secondary | ICD-10-CM | POA: Diagnosis not present

## 2016-12-24 DIAGNOSIS — S6291XD Unspecified fracture of right wrist and hand, subsequent encounter for fracture with routine healing: Secondary | ICD-10-CM | POA: Diagnosis not present

## 2016-12-24 DIAGNOSIS — Z4682 Encounter for fitting and adjustment of non-vascular catheter: Secondary | ICD-10-CM | POA: Diagnosis not present

## 2016-12-24 DIAGNOSIS — I2510 Atherosclerotic heart disease of native coronary artery without angina pectoris: Secondary | ICD-10-CM | POA: Diagnosis not present

## 2016-12-24 DIAGNOSIS — I110 Hypertensive heart disease with heart failure: Secondary | ICD-10-CM | POA: Diagnosis not present

## 2016-12-24 DIAGNOSIS — J90 Pleural effusion, not elsewhere classified: Secondary | ICD-10-CM | POA: Diagnosis not present

## 2016-12-24 DIAGNOSIS — I5032 Chronic diastolic (congestive) heart failure: Secondary | ICD-10-CM | POA: Diagnosis not present

## 2016-12-27 DIAGNOSIS — S6291XD Unspecified fracture of right wrist and hand, subsequent encounter for fracture with routine healing: Secondary | ICD-10-CM | POA: Diagnosis not present

## 2016-12-27 DIAGNOSIS — I110 Hypertensive heart disease with heart failure: Secondary | ICD-10-CM | POA: Diagnosis not present

## 2016-12-27 DIAGNOSIS — J90 Pleural effusion, not elsewhere classified: Secondary | ICD-10-CM | POA: Diagnosis not present

## 2016-12-27 DIAGNOSIS — I2510 Atherosclerotic heart disease of native coronary artery without angina pectoris: Secondary | ICD-10-CM | POA: Diagnosis not present

## 2016-12-27 DIAGNOSIS — Z4682 Encounter for fitting and adjustment of non-vascular catheter: Secondary | ICD-10-CM | POA: Diagnosis not present

## 2016-12-27 DIAGNOSIS — I5032 Chronic diastolic (congestive) heart failure: Secondary | ICD-10-CM | POA: Diagnosis not present

## 2016-12-31 DIAGNOSIS — Z4682 Encounter for fitting and adjustment of non-vascular catheter: Secondary | ICD-10-CM | POA: Diagnosis not present

## 2016-12-31 DIAGNOSIS — J90 Pleural effusion, not elsewhere classified: Secondary | ICD-10-CM | POA: Diagnosis not present

## 2016-12-31 DIAGNOSIS — I110 Hypertensive heart disease with heart failure: Secondary | ICD-10-CM | POA: Diagnosis not present

## 2016-12-31 DIAGNOSIS — I5032 Chronic diastolic (congestive) heart failure: Secondary | ICD-10-CM | POA: Diagnosis not present

## 2016-12-31 DIAGNOSIS — I2510 Atherosclerotic heart disease of native coronary artery without angina pectoris: Secondary | ICD-10-CM | POA: Diagnosis not present

## 2016-12-31 DIAGNOSIS — S6291XD Unspecified fracture of right wrist and hand, subsequent encounter for fracture with routine healing: Secondary | ICD-10-CM | POA: Diagnosis not present

## 2017-01-03 DIAGNOSIS — I110 Hypertensive heart disease with heart failure: Secondary | ICD-10-CM | POA: Diagnosis not present

## 2017-01-03 DIAGNOSIS — I5032 Chronic diastolic (congestive) heart failure: Secondary | ICD-10-CM | POA: Diagnosis not present

## 2017-01-03 DIAGNOSIS — S6291XD Unspecified fracture of right wrist and hand, subsequent encounter for fracture with routine healing: Secondary | ICD-10-CM | POA: Diagnosis not present

## 2017-01-03 DIAGNOSIS — I2510 Atherosclerotic heart disease of native coronary artery without angina pectoris: Secondary | ICD-10-CM | POA: Diagnosis not present

## 2017-01-03 DIAGNOSIS — Z4682 Encounter for fitting and adjustment of non-vascular catheter: Secondary | ICD-10-CM | POA: Diagnosis not present

## 2017-01-03 DIAGNOSIS — J90 Pleural effusion, not elsewhere classified: Secondary | ICD-10-CM | POA: Diagnosis not present

## 2017-01-07 ENCOUNTER — Telehealth: Payer: Self-pay | Admitting: *Deleted

## 2017-01-07 DIAGNOSIS — I5032 Chronic diastolic (congestive) heart failure: Secondary | ICD-10-CM | POA: Diagnosis not present

## 2017-01-07 DIAGNOSIS — I2510 Atherosclerotic heart disease of native coronary artery without angina pectoris: Secondary | ICD-10-CM | POA: Diagnosis not present

## 2017-01-07 DIAGNOSIS — J90 Pleural effusion, not elsewhere classified: Secondary | ICD-10-CM | POA: Diagnosis not present

## 2017-01-07 DIAGNOSIS — S6291XD Unspecified fracture of right wrist and hand, subsequent encounter for fracture with routine healing: Secondary | ICD-10-CM | POA: Diagnosis not present

## 2017-01-07 DIAGNOSIS — I110 Hypertensive heart disease with heart failure: Secondary | ICD-10-CM | POA: Diagnosis not present

## 2017-01-07 DIAGNOSIS — Z4682 Encounter for fitting and adjustment of non-vascular catheter: Secondary | ICD-10-CM | POA: Diagnosis not present

## 2017-01-07 NOTE — Telephone Encounter (Signed)
[facility]-she is currently in patients home and is calling to inform [facility] that pt is out of apixaban.  Pt has a rx at the pharmacy-its covered by pt's insurance, but pt is unable to afford the $37 copay.  Pharmacy [phone] ([facility] on [address]) states they have not filled any since  Feb 2018.  I will forward info to Olene FlossrJen Kim to see what options are available to reduce the cost of this medication.Kingsley SpittleGoldston, Darlene Cassady6/8/20181:36 PM

## 2017-01-07 NOTE — Telephone Encounter (Signed)
Thank you... Please let us know if there is an alternative that is more affordable.

## 2017-01-07 NOTE — Telephone Encounter (Signed)
I am going to try to work with her to see if she would qualify for Medicare Extra Help or a tier exception. Patient requested appointment with me Monday to review and we can also give her samples in the meantime.  Thank you!

## 2017-01-07 NOTE — Telephone Encounter (Signed)
Thank you Dr. Kim 

## 2017-01-10 ENCOUNTER — Ambulatory Visit: Payer: Medicare Other | Admitting: Pharmacist

## 2017-01-10 DIAGNOSIS — J90 Pleural effusion, not elsewhere classified: Secondary | ICD-10-CM | POA: Diagnosis not present

## 2017-01-10 DIAGNOSIS — I5032 Chronic diastolic (congestive) heart failure: Secondary | ICD-10-CM | POA: Diagnosis not present

## 2017-01-10 DIAGNOSIS — S6291XD Unspecified fracture of right wrist and hand, subsequent encounter for fracture with routine healing: Secondary | ICD-10-CM | POA: Diagnosis not present

## 2017-01-10 DIAGNOSIS — Z4682 Encounter for fitting and adjustment of non-vascular catheter: Secondary | ICD-10-CM | POA: Diagnosis not present

## 2017-01-10 DIAGNOSIS — I2510 Atherosclerotic heart disease of native coronary artery without angina pectoris: Secondary | ICD-10-CM | POA: Diagnosis not present

## 2017-01-10 DIAGNOSIS — I110 Hypertensive heart disease with heart failure: Secondary | ICD-10-CM | POA: Diagnosis not present

## 2017-01-11 ENCOUNTER — Encounter: Payer: Self-pay | Admitting: *Deleted

## 2017-01-11 ENCOUNTER — Encounter: Payer: Self-pay | Admitting: Pharmacist

## 2017-01-11 NOTE — Progress Notes (Signed)
Unknown FoleyMae W Mac is a 83 y.o. female who was contacted via telephone for monitoring of apixaban (Eliquis) therapy.    ASSESSMENT Indication(s): atrial fibrillation, CHADSVASC 6, history of PCI, HASBLED 3 Duration: indefinite  Labs: Component Value Date/Time   AST 18 10/27/2016 1222   ALT 8 10/27/2016 1222   NA 138 10/27/2016 1222   NA 138 01/19/2016 1155   K 4.1 10/27/2016 1222   CL 105 10/27/2016 1222   CO2 27 10/27/2016 1222   GLUCOSE 125 (H) 10/27/2016 1222   HGBA1C 6.9 (H) 12/18/2015 1522   BUN 19 10/27/2016 1222   BUN 22 01/19/2016 1155   CREATININE 1.31 (H) 10/27/2016 1222   CALCIUM 10.1 10/27/2016 1222   GFRAA 30 (L) 07/18/2016 1458   WBC 5.1 10/27/2016 1222   HGB 13.8 10/27/2016 1222   HCT 42.4 10/27/2016 1222   PLT 242.0 10/27/2016 1222   apixaban (Eliquis) Dose: 2.5 mg BID  Safety: Patient has not had recent bleeding/thromboembolic events. Patient reports some bruising on arms, advised patient to notify physician at next follow up for evaluation, no signs of symptoms of thromboembolism. Medication changes: no.  Renal/hepatic/drug interaction concerns: no.  Adherence: Patient does not correctly recite the dose. Patient reports she has been taking 1 tablet daily due to inability to afford. Patient applied for tier exception but was denied. Patient has also applied for medicare extra help and now is awaiting response. Will continue to work with patient on access. Samples were provided from clinic.  Patient Instructions: Patient advised to contact clinic or seek medical attention if signs/symptoms of bleeding or thromboembolism occur. Patient verbalized understanding by repeating back information.  Follow-up Recommend monitoring of renal function Next appointment 01/12/17  Marzetta BoardJennifer Kim Clinical Pharmacist  01/11/2017, 10:38 AM

## 2017-01-12 ENCOUNTER — Encounter: Payer: Medicare Other | Admitting: Cardiothoracic Surgery

## 2017-01-16 DIAGNOSIS — I110 Hypertensive heart disease with heart failure: Secondary | ICD-10-CM | POA: Diagnosis not present

## 2017-01-16 DIAGNOSIS — I2510 Atherosclerotic heart disease of native coronary artery without angina pectoris: Secondary | ICD-10-CM | POA: Diagnosis not present

## 2017-01-16 DIAGNOSIS — Z4682 Encounter for fitting and adjustment of non-vascular catheter: Secondary | ICD-10-CM | POA: Diagnosis not present

## 2017-01-16 DIAGNOSIS — I5032 Chronic diastolic (congestive) heart failure: Secondary | ICD-10-CM | POA: Diagnosis not present

## 2017-01-16 DIAGNOSIS — S6291XD Unspecified fracture of right wrist and hand, subsequent encounter for fracture with routine healing: Secondary | ICD-10-CM | POA: Diagnosis not present

## 2017-01-16 DIAGNOSIS — J90 Pleural effusion, not elsewhere classified: Secondary | ICD-10-CM | POA: Diagnosis not present

## 2017-01-19 ENCOUNTER — Other Ambulatory Visit: Payer: Self-pay | Admitting: *Deleted

## 2017-01-19 DIAGNOSIS — J90 Pleural effusion, not elsewhere classified: Secondary | ICD-10-CM

## 2017-01-20 ENCOUNTER — Encounter: Payer: Medicare Other | Admitting: Cardiothoracic Surgery

## 2017-01-20 ENCOUNTER — Ambulatory Visit
Admission: RE | Admit: 2017-01-20 | Discharge: 2017-01-20 | Disposition: A | Discharge status: Home / Self Care | Payer: Medicare Other | Source: Ambulatory Visit | Attending: Cardiothoracic Surgery | Admitting: Cardiothoracic Surgery

## 2017-01-20 DIAGNOSIS — J90 Pleural effusion, not elsewhere classified: Secondary | ICD-10-CM | POA: Diagnosis not present

## 2017-01-24 ENCOUNTER — Other Ambulatory Visit: Payer: Self-pay | Admitting: Cardiothoracic Surgery

## 2017-01-24 DIAGNOSIS — J90 Pleural effusion, not elsewhere classified: Secondary | ICD-10-CM

## 2017-01-25 ENCOUNTER — Ambulatory Visit (INDEPENDENT_AMBULATORY_CARE_PROVIDER_SITE_OTHER): Payer: Medicare Other | Admitting: Physician Assistant

## 2017-01-25 VITALS — BP 117/63 | Resp 16 | Ht 63.0 in | Wt 136.2 lb

## 2017-01-25 DIAGNOSIS — Z9689 Presence of other specified functional implants: Secondary | ICD-10-CM | POA: Diagnosis not present

## 2017-01-25 DIAGNOSIS — J90 Pleural effusion, not elsewhere classified: Secondary | ICD-10-CM | POA: Diagnosis not present

## 2017-01-25 NOTE — Progress Notes (Signed)
[address]       [place] [id number]             [phone]      [name] is a 83 y.o. female patient s/p pleurx catheter placement on 07/20/2016 by Dr. Donata ClayVan Trigt. She was last seen in the office on 11/17/2016.  1. Recurrent right pleural effusion   2. Chest tube in place    Past Medical History:  Diagnosis Date  . Candidal esophagitis (HCC)    a. 03/2014.  Marland Kitchen. Cholelithiasis   . Chronic diastolic CHF (congestive heart failure) (HCC)    a. 06/2011 Echo: EF 60-65%, no rwma, Gr1 DD, mild TR. // b. Echo 12/19/15: Severe LVH, EF 55-60%, normal wall motion, mild MR, moderate LAE, moderate TR, PASP 44 mmHg  . CKD (chronic kidney disease), stage III    a. in setting of prior ESRD and cadaveric tx in 2003.  Marland Kitchen. Coronary artery disease    a. 01/2001 Cath/PCI: LAD 50p, 2442m, D1 50-60, RCA 451m (3.0x13 BX Velocity BMS).// b. NSTEMI 5/17 (demand ischemia) in setting of AF with RVR, pneumonia, CKD, a/c HF >> Myoview 12/26/15: prob inf infarct, no ischemia, EF 52%, Low Risk >> med Rx  . Gastritis    a. 03/2014  . Gout    unconfirmed by joint aspiration  . History of bacteremia    a. 08/2004 Group A Strep bacteremia.  . Hypertensive heart disease   . S/P kidney transplant    a. due to FSGN, follows with Dr. Vivia BirminghamMattingly-->Transplant in 2003. Was on HD prior to that.  . Type II diabetes mellitus (HCC)    No past surgical history pertinent negatives on file. Scheduled Meds: Current Outpatient Prescriptions on File Prior to Visit  Medication Sig Dispense Refill  . amiodarone (PACERONE) 200 MG tablet Take 1 tablet (200 mg total) by mouth 2 (two) times daily. 60 tablet 12  . amLODipine (NORVASC) 10 MG tablet Take 5 mg by mouth daily.    Marland Kitchen. apixaban (ELIQUIS) 2.5 MG TABS tablet Take 1 tablet (2.5 mg total) by mouth 2 (two) times daily. Cancel previous Eliquis prescription 60 tablet 3  . aspirin EC 81 MG tablet Take 81 mg by mouth daily.    . cinacalcet (SENSIPAR) 90 MG tablet Take  22.5 mg by mouth See admin instructions. Once every one to two weeks    . famotidine (PEPCID) 20 MG tablet Take 20 mg by mouth 2 (two) times daily.    . furosemide (LASIX) 40 MG tablet Take 40 mg by mouth 2 (two) times daily.    . mycophenolate (CELLCEPT) 250 MG capsule Take 500 mg by mouth 2 (two) times daily.    . tacrolimus (PROGRAF) 0.5 MG capsule 1 mg in the morning and 1 mg at bedtime (Patient taking differently: Take 2-2.5 mg by mouth See admin instructions. Take 2.5 mg by mouth in the morning and take 2 mg by mouth at night.) 120 capsule 0  . ondansetron (ZOFRAN) 4 MG tablet Take 1 tablet (4 mg total) by mouth daily as needed for nausea or vomiting. (Patient not taking: Reported on 01/25/2017) 15 tablet 0   No current facility-administered medications on file prior to visit.     Allergies  Allergen Reactions  . Sulfa Drugs Cross Reactors Swelling    Blood pressure 117/63, resp. rate 16, height 5\' 3"  (1.6 m), weight 61.8 kg (136 lb 3.2 oz), SpO2 98 %.  Subjective [name]  presented for her Pleurx catheter drainage and evaluation. Overall, she has been doing fairly well. She still gets very tired doing simple tasks. She does however have a lot of help at home and frequently has her daughter and neighbors to assist.   Objective  Cor: RRR, no murmur Pulm: diminished in bilateral lower lobes Abd: bowel sounds present, no tenderness Ext: 2+ pitting edema in feet and ankles Wound: pleurx cath in place without signs of infection. No stitch present (removed a while ago per patient). No drainage around the site.   CLINICAL DATA:  Shortness of breath and follow-up pleural effusion.  EXAM: CHEST  2 VIEW  COMPARISON:  11/17/2016 and prior radiographs  FINDINGS: Cardiomegaly again noted.  Small bilateral pleural effusions, right greater than left, have slightly decreased. Bibasilar atelectasis again noted.  A right thoracostomy tube is unchanged.  There is no evidence of  pneumothorax or other new abnormality.  IMPRESSION: Slightly decreased small bilateral pleural effusions. Right thoracostomy tube is unchanged.  Cardiomegaly and bibasilar atelectasis again noted.   Electronically Signed   By: Harmon Pier [name]   On: 01/20/2017 09:32  Assessment & Plan   The patient is currently draining her Pleurx catheter every other day. I asked her to continue doing so. Today we were able to drain 650 mL of serosanguineous straw-colored fluid. On chest x-ray she still has bilateral small pleural effusions which appear to be slightly improved from her last chest x-ray in April. Recent changes in medication include increasing her Lasix from 40 mg daily to 40 mg twice a day by her nephrologist. She is to continue all her other medications as prescribed. She has not had any cardiology follow-up since May of last year. Dr. Rennis Golden was the last cardiologist to follow-up with her. She does have history of atrial fibrillation which she is on Eliquis and amiodarone. She also has history of congestive heart failure and her last echocardiogram was May of last year. I have contacted Salley Hews to help facilitate scheduling a cardiology appointment for this patient. She will follow up with our office in 2 months with a chest x-ray. She was given two glass bottles today for draining and is followed by home health for assistance. If she has any questions or concerns before her next follow-up appointment she is to call our office. The patient had no further questions at this time.   Sharlene Dory 01/25/2017

## 2017-01-25 NOTE — Patient Instructions (Signed)
She will follow up with our office in 2 months with a chest x-ray.  Continue to drain the pleurx catheter every other day and continue to record the amount of drainage.   F/u with Cardiology for atrial fibrillation and CHF  If she has any questions or concerns before her next follow-up appointment she is to call our office.

## 2017-01-27 DIAGNOSIS — J918 Pleural effusion in other conditions classified elsewhere: Secondary | ICD-10-CM | POA: Diagnosis not present

## 2017-02-07 ENCOUNTER — Ambulatory Visit (INDEPENDENT_AMBULATORY_CARE_PROVIDER_SITE_OTHER): Payer: Medicare Other | Admitting: Internal Medicine

## 2017-02-07 ENCOUNTER — Encounter: Payer: Self-pay | Admitting: Internal Medicine

## 2017-02-07 VITALS — BP 120/70 | HR 57 | Ht 63.0 in | Wt 134.0 lb

## 2017-02-07 DIAGNOSIS — J90 Pleural effusion, not elsewhere classified: Secondary | ICD-10-CM

## 2017-02-07 DIAGNOSIS — I5032 Chronic diastolic (congestive) heart failure: Secondary | ICD-10-CM

## 2017-02-07 DIAGNOSIS — R5383 Other fatigue: Secondary | ICD-10-CM

## 2017-02-07 DIAGNOSIS — R0602 Shortness of breath: Secondary | ICD-10-CM

## 2017-02-07 DIAGNOSIS — R011 Cardiac murmur, unspecified: Secondary | ICD-10-CM

## 2017-02-07 DIAGNOSIS — I480 Paroxysmal atrial fibrillation: Secondary | ICD-10-CM | POA: Diagnosis not present

## 2017-02-07 NOTE — Patient Instructions (Addendum)
Your physician has recommended you make the following change in your medication:  -- STOP amiodarone  Your physician has requested that you have an echocardiogram @ [address]. Echocardiography is a painless test that uses sound waves to create images of your heart. It provides your doctor with information about the size and shape of your heart and how well your heart's chambers and valves are working. This procedure takes approximately one hour. There are no restrictions for this procedure.  Your physician recommends that you schedule a follow-up appointment in: ONE MONTH with Dr. Rennis GoldenHilty.

## 2017-02-07 NOTE — Progress Notes (Signed)
OFFICE NOTE  Chief Complaint:  Fatigue, shortness of breath  Primary Care Physician: Toney Rakes, MD  HPI:  [name] is a 83 y.o. 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  Cardiac cath was done in 2002. Mid RCA 85% lesion was treated with a bare metal stent. She had moderate nonobstructive disease in LAD and diagonal treated medically. She's been seen by different cardiologists during past admissions but has never followed up in the office.   Most recently admitted 5/18-6/1 with community-acquired pneumococcal pneumonia complicated by AF with RVR and non-STEMI.  Echo demonstrated normal LV function with an EF of 55-60%. Troponin peaked at 1.26. On rate control therapy, she converted to NSR.  Pneumonia was complicated by right pleural effusion. She required thoracentesis x 2. This was likely exudative. Cytology was neg for malignancy.  Initially, cardiac catheterization was considered. Nephrology also followed the patient. Her creatinine increased but did improve prior to DC.  Given her prior renal transplant, it was felt that cardiac catheterization should be avoided. It was thought that her elevated troponin was likely related to demand ischemia.  Inpatient nuclear stress test was arranged.  This was low risk and neg for ischemia.  Med Rx was recommended.  CHADS2-VASc=7 (age > 72, female, HTN, DM, CHF, CAD).  Decision was made to start chronic anticoagulation with Eliquis.    She was seen in FU by Pulmonology Rubye Oaks, NP) on 01/12/16.  Patient had re-accumulation of R pleural effusion.  She was set up for repeat thoracentesis which was done 6/16.   she is also been on chronic oxygen therapy with 2 L by nasal cannula.  03/01/2016  [name] was seen here today in follow-up. Recently she was seen by her home health nurse and noted she been bradycardiac and reports. The time  were heart rate is very low associated with low blood pressure and fatigue. Heart rate today is in the low 50s. She was placed on amiodarone and maintained on metoprolol for recent admission for atrial fibrillation and rapid ventricular response. She seems to be maintaining a normal rhythm. She was also placed on Eliquis for anticoagulation and remains on low-dose aspirin. She was noted to be only on 1/2 L per minute of oxygen and I instructed her on how to properly set her regulator.  03/31/2016  [name] was seen back in follow-up today. Again she feels significantly fatigued, no energy and generally says that she has not been herself over the past year. She is now on a correct amount of oxygen and I decreased her beta blocker to allow higher heart rate. Heart rate today is in the mid 60s and blood pressure 120/68 therefore those lower numbers did not seem to affect her energy level. She had stress testing in May of this year as well as an echocardiogram which showed normal LV function and no reversible ischemia. There was mild to moderate pulmonary hypertension. I do not believe the cardiac findings would support her symptoms. It is feasible that she could have more severe coronary disease then is captured by stress testing, but she is not a candidate for heart catheterization at this point due to severe chronic kidney disease and a high risk of proceeding to dialysis again if she were to have heart catheterization.  02/07/2017  [name] returns today for follow-up. She continues to have concerns about shortness  of breath and significant fatigue with even minimal exertion. She says that coming to the doctor's office wears her out. Recently she was found to have pleural effusion which is recurrent and required placement of a Pleurx catheter. She's followed by thoracic surgery for this. She's not had an echo in about a year. This improve is shown a grade 1 diastolic dysfunction with severe LVH. She may have underlying  coronary artery disease however given her chronic kidney disease, it was felt to be too high risk for her to undergo cardiac catheterization, although recently lab work indicates her GFR was only in the 40s, but previously had been in the 20s. EKG was reviewed and again today show significant AV delay with a QRS duration of 208 ms and a prolonged QTC of 613 ms, most of which may be related to ventricular conduction delay. She is on amiodarone and has been in sinus rhythm for some time.  PMHx:  Past Medical History:  Diagnosis Date  . Candidal esophagitis (HCC)    a. 03/2014.  Marland Kitchen. Cholelithiasis   . Chronic diastolic CHF (congestive heart failure) (HCC)    a. 06/2011 Echo: EF 60-65%, no rwma, Gr1 DD, mild TR. // b. Echo 12/19/15: Severe LVH, EF 55-60%, normal wall motion, mild MR, moderate LAE, moderate TR, PASP 44 mmHg  . CKD (chronic kidney disease), stage III    a. in setting of prior ESRD and cadaveric tx in 2003.  Marland Kitchen. Coronary artery disease    a. 01/2001 Cath/PCI: LAD 50p, 5171m, D1 50-60, RCA 7189m (3.0x13 BX Velocity BMS).// b. NSTEMI 5/17 (demand ischemia) in setting of AF with RVR, pneumonia, CKD, a/c HF >> Myoview 12/26/15: prob inf infarct, no ischemia, EF 52%, Low Risk >> med Rx  . Gastritis    a. 03/2014  . Gout    unconfirmed by joint aspiration  . History of bacteremia    a. 08/2004 Group A Strep bacteremia.  . Hypertensive heart disease   . S/P kidney transplant    a. due to FSGN, follows with Dr. Vivia BirminghamMattingly-->Transplant in 2003. Was on HD prior to that.  . Type II diabetes mellitus (HCC)     Past Surgical History:  Procedure Laterality Date  . ABDOMINAL HYSTERECTOMY    . CHEST TUBE INSERTION Right 07/20/2016   Procedure: INSERTION RIGHT PLEURAL DRAINAGE CATHETER;  Surgeon: Kerin PernaPeter Van Trigt, MD;  [facility];  Service: Thoracic;  Laterality: Right;  . ESOPHAGOGASTRODUODENOSCOPY N/A 03/20/2014   Procedure: ESOPHAGOGASTRODUODENOSCOPY (EGD);  Surgeon: Theda BelfastPatrick D Hung, MD;  [facility];  Service: Endoscopy;  Laterality: N/A;  . KIDNEY TRANSPLANT  2003  . VIDEO BRONCHOSCOPY Bilateral 05/12/2016   Procedure: VIDEO BRONCHOSCOPY WITHOUT FLUORO;  Surgeon: Nyoka CowdenMichael B Wert, MD;  Location: [facility] ENDOSCOPY;  Service: Cardiopulmonary;  Laterality: Bilateral;    FAMHx:  Family History  Problem Relation Age of Onset  . CAD Father   . CAD Brother   . CAD Unknown        Both parents and multiple siblings have died from coronary disease prior to age 10870 and several in their 1940's and 5750's.    SOCHx:   reports that she quit smoking about 47 years ago. She has never used smokeless tobacco. She reports that she does not drink alcohol or use drugs.  ALLERGIES:  Allergies  Allergen Reactions  . Sulfa Drugs Cross Reactors Swelling    ROS: Pertinent items noted in HPI and remainder of comprehensive ROS otherwise negative.  HOME MEDS: Current  Outpatient Prescriptions  Medication Sig Dispense Refill  . amLODipine (NORVASC) 10 MG tablet Take 5 mg by mouth daily.    Marland Kitchen apixaban (ELIQUIS) 2.5 MG TABS tablet Take 1 tablet (2.5 mg total) by mouth 2 (two) times daily. Cancel previous Eliquis prescription 60 tablet 3  . famotidine (PEPCID) 20 MG tablet Take 20 mg by mouth 2 (two) times daily.    . furosemide (LASIX) 80 MG tablet Take 80 mg by mouth 2 (two) times daily.     . metoprolol tartrate (LOPRESSOR) 50 MG tablet Take 1 tablet by mouth 2 (two) times daily.    . mycophenolate (CELLCEPT) 250 MG capsule Take 500 mg by mouth 2 (two) times daily.    Marland Kitchen PROGRAF 1 MG capsule Take 2 mg by mouth 2 (two) times daily.  6   No current facility-administered medications for this visit.     LABS/IMAGING: No results found for this or any previous visit (from the past 48 hour(s)). No results found.  WEIGHTS: Wt Readings from Last 3 Encounters:  02/07/17 134 lb (60.8 kg)  01/25/17 136 lb 3.2 oz (61.8 kg)  11/17/16 125 lb (56.7 kg)    VITALS: BP 120/70   Pulse (!) 57   Ht 5\' 3"  (1.6  m)   Wt 134 lb (60.8 kg)   BMI 23.74 kg/m   EXAM: General appearance: alert and no distress Neck: no carotid bruit and no JVD Lungs: clear to auscultation bilaterally Heart: regular rate and rhythm Abdomen: soft, non-tender; bowel sounds normal; no masses,  no organomegaly Extremities: extremities normal, atraumatic, no cyanosis or edema Pulses: 2+ and symmetric Skin: Skin color, texture, turgor normal. No rashes or lesions Neurologic: Grossly normal Psych: Frustrated  EKG: Sinus bradycardia first-degree AV block at 57, possible left atrial enlargement, LVH with repolarization abnormality, widened QRS at 208 ms suggestive of conduction delay - personally reviewed  ASSESSMENT: 1. Coronary artery disease with history of BMS to the RCA in 2002 2. End-stage renal disease status post renal transplant secondary to FSGN 3. Hypertensive heart disease 4. Chronic diastolic heart failure 5. Recent atrial fibrillation - CHADSVASC score of 7 6. Gout 7. Type 2 diabetes 8. Pleural effusion/bilateral infiltrates  PLAN: 1.   Mrs. Strider has continued shortness of breath and fatigue. This is despite treatment of a pleural effusion which she says has not improved her symptoms. She is on diuretics. She has significant intraventricular conduction delay which appears stable. I like to recheck an echo to make sure there is been no interval development of cardiomyopathy. He could be the medications are playing a role in her symptoms. She is on amiodarone 400 mg a day. I like to go ahead and discontinue that and continue her only on beta blocker to see if her symptoms improved. She is maintaining sinus rhythm. She is on Eliquis for stroke prevention with a high CHADSVASC score.  Plan follow-up in 1 month. If symptoms persist, may need to reconsider cath at some point.  Chrystie Nose, MD, [facility] Attending Cardiologist [facility]  Lisette Abu Tomasita Beevers 02/07/2017, 11:21 AM

## 2017-02-14 DIAGNOSIS — E212 Other hyperparathyroidism: Secondary | ICD-10-CM | POA: Diagnosis not present

## 2017-02-14 DIAGNOSIS — Z940 Kidney transplant status: Secondary | ICD-10-CM | POA: Diagnosis not present

## 2017-02-16 DIAGNOSIS — Z940 Kidney transplant status: Secondary | ICD-10-CM | POA: Diagnosis not present

## 2017-02-16 DIAGNOSIS — N183 Chronic kidney disease, stage 3 (moderate): Secondary | ICD-10-CM | POA: Diagnosis not present

## 2017-02-16 DIAGNOSIS — J90 Pleural effusion, not elsewhere classified: Secondary | ICD-10-CM | POA: Diagnosis not present

## 2017-02-16 DIAGNOSIS — E663 Overweight: Secondary | ICD-10-CM | POA: Diagnosis not present

## 2017-02-16 DIAGNOSIS — E212 Other hyperparathyroidism: Secondary | ICD-10-CM | POA: Diagnosis not present

## 2017-02-16 DIAGNOSIS — I10 Essential (primary) hypertension: Secondary | ICD-10-CM | POA: Diagnosis not present

## 2017-02-21 ENCOUNTER — Other Ambulatory Visit (HOSPITAL_COMMUNITY): Payer: Medicare Other

## 2017-02-23 ENCOUNTER — Ambulatory Visit (HOSPITAL_COMMUNITY): Payer: Medicare Other | Attending: Internal Medicine

## 2017-02-23 ENCOUNTER — Other Ambulatory Visit: Payer: Self-pay

## 2017-02-23 DIAGNOSIS — I348 Other nonrheumatic mitral valve disorders: Secondary | ICD-10-CM | POA: Diagnosis not present

## 2017-02-23 DIAGNOSIS — J90 Pleural effusion, not elsewhere classified: Secondary | ICD-10-CM

## 2017-02-23 DIAGNOSIS — R5383 Other fatigue: Secondary | ICD-10-CM

## 2017-02-23 DIAGNOSIS — J918 Pleural effusion in other conditions classified elsewhere: Secondary | ICD-10-CM | POA: Diagnosis not present

## 2017-02-23 DIAGNOSIS — R011 Cardiac murmur, unspecified: Secondary | ICD-10-CM

## 2017-02-23 DIAGNOSIS — R0602 Shortness of breath: Secondary | ICD-10-CM

## 2017-03-17 DIAGNOSIS — J918 Pleural effusion in other conditions classified elsewhere: Secondary | ICD-10-CM | POA: Diagnosis not present

## 2017-03-22 ENCOUNTER — Encounter: Payer: Self-pay | Admitting: Surgical

## 2017-03-22 ENCOUNTER — Ambulatory Visit
Admission: RE | Admit: 2017-03-22 | Discharge: 2017-03-22 | Disposition: A | Discharge status: Home / Self Care | Payer: Medicare Other | Source: Ambulatory Visit | Attending: Cardiothoracic Surgery | Admitting: Cardiothoracic Surgery

## 2017-03-22 ENCOUNTER — Ambulatory Visit (INDEPENDENT_AMBULATORY_CARE_PROVIDER_SITE_OTHER): Payer: Medicare Other | Admitting: Surgical

## 2017-03-22 VITALS — BP 124/62 | HR 51 | Resp 16 | Ht 63.0 in | Wt 132.0 lb

## 2017-03-22 DIAGNOSIS — J90 Pleural effusion, not elsewhere classified: Secondary | ICD-10-CM | POA: Diagnosis not present

## 2017-03-22 DIAGNOSIS — R0602 Shortness of breath: Secondary | ICD-10-CM | POA: Diagnosis not present

## 2017-03-22 DIAGNOSIS — Z9689 Presence of other specified functional implants: Secondary | ICD-10-CM

## 2017-03-22 DIAGNOSIS — Z4682 Encounter for fitting and adjustment of non-vascular catheter: Secondary | ICD-10-CM | POA: Diagnosis not present

## 2017-03-22 NOTE — Patient Instructions (Signed)
Continue same drainage schedule

## 2017-03-22 NOTE — Progress Notes (Signed)
[address]       [place] [address]             [phone]      ALBERTO DONG [facility] Medical Record #[id number] Date of Birth: 06-27-1932  Referring: Nyoka Cowden, MD Primary Care: Toney Rakes, MD  Chief Complaint:   POST OP FOLLOW UP  History of Present Illness:    The patient is a 83 year old female status post Pleurx catheter for non-malignant effusion placed last January by Dr. Donata Clay. Last Saturday while draining the catheter they noticed that it was difficult to obtain flow and there appeared to be some clot in the tubing. She came to the office on today's date to evaluate the tube and its function.      Past Medical History:  Diagnosis Date  . Candidal esophagitis (HCC)    a. 03/2014.  Marland Kitchen Cholelithiasis   . Chronic diastolic CHF (congestive heart failure) (HCC)    a. 06/2011 Echo: EF 60-65%, no rwma, Gr1 DD, mild TR. // b. Echo 12/19/15: Severe LVH, EF 55-60%, normal wall motion, mild MR, moderate LAE, moderate TR, PASP 44 mmHg  . CKD (chronic kidney disease), stage III    a. in setting of prior ESRD and cadaveric tx in 2003.  Marland Kitchen Coronary artery disease    a. 01/2001 Cath/PCI: LAD 50p, 65m, D1 50-60, RCA 63m (3.0x13 BX Velocity BMS).// b. NSTEMI 5/17 (demand ischemia) in setting of AF with RVR, pneumonia, CKD, a/c HF >> Myoview 12/26/15: prob inf infarct, no ischemia, EF 52%, Low Risk >> med Rx  . Gastritis    a. 03/2014  . Gout    unconfirmed by joint aspiration  . History of bacteremia    a. 08/2004 Group A Strep bacteremia.  . Hypertensive heart disease   . S/P kidney transplant    a. due to FSGN, follows with Dr. Vivia Birmingham in 2003. Was on HD prior to that.  . Type II diabetes mellitus (HCC)      History  Smoking Status  . Former Smoker  . Quit date: 04/19/1969  Smokeless Tobacco  . Never Used    History  Alcohol Use No     Allergies  Allergen Reactions  . Sulfa Drugs Cross Reactors Swelling     Current Outpatient Prescriptions  Medication Sig Dispense Refill  . amLODipine (NORVASC) 10 MG tablet Take 5 mg by mouth daily.    Marland Kitchen apixaban (ELIQUIS) 2.5 MG TABS tablet Take 1 tablet (2.5 mg total) by mouth 2 (two) times daily. Cancel previous Eliquis prescription 60 tablet 3  . famotidine (PEPCID) 20 MG tablet Take 20 mg by mouth 2 (two) times daily.    . furosemide (LASIX) 80 MG tablet Take 80 mg by mouth 2 (two) times daily.     . metoprolol tartrate (LOPRESSOR) 50 MG tablet Take 1 tablet by mouth 2 (two) times daily.    . mycophenolate (CELLCEPT) 250 MG capsule Take 500 mg by mouth 2 (two) times daily.    Marland Kitchen PROGRAF 1 MG capsule Take 2 mg by mouth 2 (two) times daily.  6   No current facility-administered medications for this visit.        Physical Exam: BP 124/62 (BP Location: Right Arm, Patient Position: Sitting, Cuff Size: Large)   Pulse (!) 51   Resp 16   Ht 5\' 3"  (1.6 m)   Wt 132 lb (59.9 kg)   SpO2 94% Comment: ON RA  BMI 23.38 kg/m   General appearance: alert, cooperative and no distress Heart: regular rate and rhythm Lungs: Mildly diminished in the bases Pleurx site: Insertion site looks good without evidence of infection.  Diagnostic Studies & Laboratory data:     Recent Radiology Findings:   Dg Chest 2 View  Result Date: 03/22/2017 CLINICAL DATA:  Right pleural effusion with shortness of breath EXAM: CHEST  2 VIEW COMPARISON:  January 20, 2017 FINDINGS: There is a chest tube on the right. There is an focal right pleural effusion but no pneumothorax. There is a slightly smaller left pleural effusion. There is bibasilar atelectasis. There is cardiomegaly with mild pulmonary venous hypertension. There is aortic atherosclerosis. No adenopathy. There is arthropathy in each shoulder. There are foci of coronary artery calcification. IMPRESSION: Chest tube on the right without pneumothorax. There are pleural effusions bilaterally which appear partially loculated. There  is bibasilar atelectasis. There is underlying pulmonary vascular congestion. There is aortic atherosclerosis. There are foci of coronary artery calcification. Aortic Atherosclerosis (ICD10-I70.0). Electronically Signed   By: Bretta Bang III [name]   On: 03/22/2017 14:35      Recent Lab Findings: Lab Results  Component Value Date   WBC 5.1 10/27/2016   HGB 13.8 10/27/2016   HCT 42.4 10/27/2016   PLT 242.0 10/27/2016   GLUCOSE 125 (H) 10/27/2016   CHOL 101 12/19/2015   TRIG 83 12/19/2015   HDL 34 (L) 12/19/2015   LDLCALC 50 12/19/2015   ALT 8 10/27/2016   AST 18 10/27/2016   NA 138 10/27/2016   K 4.1 10/27/2016   CL 105 10/27/2016   CREATININE 1.31 (H) 10/27/2016   BUN 19 10/27/2016   CO2 27 10/27/2016   TSH 1.38 10/27/2016   INR 1.51 07/20/2016   HGBA1C 6.9 (H) 12/18/2015      Assessment / Plan:  Today in the office we drain the Pleurx catheter using usual technique and obtained 500 cc of clear yellow pleural fluid without difficulty. Whatever the source of the difficulty from the other day appears to have resolved. We will see her again in the office in 2 months or prior to that when necessary as requested.          GOLD,WAYNE E, PA-C 03/22/2017 3:27 PM

## 2017-03-23 ENCOUNTER — Encounter: Payer: Self-pay | Admitting: Internal Medicine

## 2017-03-23 ENCOUNTER — Ambulatory Visit (INDEPENDENT_AMBULATORY_CARE_PROVIDER_SITE_OTHER): Payer: Medicare Other | Admitting: Internal Medicine

## 2017-03-23 ENCOUNTER — Encounter: Payer: Medicare Other | Admitting: Cardiothoracic Surgery

## 2017-03-23 VITALS — BP 114/63 | HR 55 | Ht 63.0 in | Wt 132.0 lb

## 2017-03-23 DIAGNOSIS — J90 Pleural effusion, not elsewhere classified: Secondary | ICD-10-CM

## 2017-03-23 DIAGNOSIS — I480 Paroxysmal atrial fibrillation: Secondary | ICD-10-CM | POA: Diagnosis not present

## 2017-03-23 DIAGNOSIS — I5032 Chronic diastolic (congestive) heart failure: Secondary | ICD-10-CM | POA: Diagnosis not present

## 2017-03-23 DIAGNOSIS — R0602 Shortness of breath: Secondary | ICD-10-CM

## 2017-03-23 NOTE — Progress Notes (Signed)
OFFICE NOTE  Chief Complaint:  Fatigue, shortness of breath  Primary Care Physician: Toney Rakes, MD  HPI:  [name] is a 83 y.o. 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  Cardiac cath was done in 2002. Mid RCA 85% lesion was treated with a bare metal stent. She had moderate nonobstructive disease in LAD and diagonal treated medically. She's been seen by different cardiologists during past admissions but has never followed up in the office.   Most recently admitted 5/18-6/1 with community-acquired pneumococcal pneumonia complicated by AF with RVR and non-STEMI.  Echo demonstrated normal LV function with an EF of 55-60%. Troponin peaked at 1.26. On rate control therapy, she converted to NSR.  Pneumonia was complicated by right pleural effusion. She required thoracentesis x 2. This was likely exudative. Cytology was neg for malignancy.  Initially, cardiac catheterization was considered. Nephrology also followed the patient. Her creatinine increased but did improve prior to DC.  Given her prior renal transplant, it was felt that cardiac catheterization should be avoided. It was thought that her elevated troponin was likely related to demand ischemia.  Inpatient nuclear stress test was arranged.  This was low risk and neg for ischemia.  Med Rx was recommended.  CHADS2-VASc=7 (age > 68, female, HTN, DM, CHF, CAD).  Decision was made to start chronic anticoagulation with Eliquis.    She was seen in FU by Pulmonology Rubye Oaks, NP) on 01/12/16.  Patient had re-accumulation of R pleural effusion.  She was set up for repeat thoracentesis which was done 6/16.   she is also been on chronic oxygen therapy with 2 L by nasal cannula.  03/01/2016  [name] was seen here today in follow-up. Recently she was seen by her home health nurse and noted she been bradycardiac and reports. The time  were heart rate is very low associated with low blood pressure and fatigue. Heart rate today is in the low 50s. She was placed on amiodarone and maintained on metoprolol for recent admission for atrial fibrillation and rapid ventricular response. She seems to be maintaining a normal rhythm. She was also placed on Eliquis for anticoagulation and remains on low-dose aspirin. She was noted to be only on 1/2 L per minute of oxygen and I instructed her on how to properly set her regulator.  03/31/2016  [name] was seen back in follow-up today. Again she feels significantly fatigued, no energy and generally says that she has not been herself over the past year. She is now on a correct amount of oxygen and I decreased her beta blocker to allow higher heart rate. Heart rate today is in the mid 60s and blood pressure 120/68 therefore those lower numbers did not seem to affect her energy level. She had stress testing in May of this year as well as an echocardiogram which showed normal LV function and no reversible ischemia. There was mild to moderate pulmonary hypertension. I do not believe the cardiac findings would support her symptoms. It is feasible that she could have more severe coronary disease then is captured by stress testing, but she is not a candidate for heart catheterization at this point due to severe chronic kidney disease and a high risk of proceeding to dialysis again if she were to have heart catheterization.  02/07/2017  [name] returns today for follow-up. She continues to have concerns about shortness  of breath and significant fatigue with even minimal exertion. She says that coming to the doctor's office wears her out. Recently she was found to have pleural effusion which is recurrent and required placement of a Pleurx catheter. She's followed by thoracic surgery for this. She's not had an echo in about a year. This improve is shown a grade 1 diastolic dysfunction with severe LVH. She may have underlying  coronary artery disease however given her chronic kidney disease, it was felt to be too high risk for her to undergo cardiac catheterization, although recently lab work indicates her GFR was only in the 40s, but previously had been in the 20s. EKG was reviewed and again today show significant AV delay with a QRS duration of 208 ms and a prolonged QTC of 613 ms, most of which may be related to ventricular conduction delay. She is on amiodarone and has been in sinus rhythm for some time.  03/23/2017  [name] was seen today in follow-up. She reports persistent shortness of breath. She has a Pleurx catheter which is remained in she's had some recurrent pleural effusion. She recently had it drained yesterday and had excellent output. The etiology of her shortness of breath is unclear she gets significantly short of breath with minimal exertion. She does have severe LVH and grade 2 diastolic dysfunction, but heart rate is felt low in the 50s. I discontinued her amiodarone and she seems to be maintaining sinus rhythm. She's also on 2 immunosuppressive's for history of cadaveric renal transplant.  PMHx:  Past Medical History:  Diagnosis Date  . Candidal esophagitis (HCC)    a. 03/2014.  Marland Kitchen Cholelithiasis   . Chronic diastolic CHF (congestive heart failure) (HCC)    a. 06/2011 Echo: EF 60-65%, no rwma, Gr1 DD, mild TR. // b. Echo 12/19/15: Severe LVH, EF 55-60%, normal wall motion, mild MR, moderate LAE, moderate TR, PASP 44 mmHg  . CKD (chronic kidney disease), stage III    a. in setting of prior ESRD and cadaveric tx in 2003.  Marland Kitchen Coronary artery disease    a. 01/2001 Cath/PCI: LAD 50p, 93m, D1 50-60, RCA 36m (3.0x13 BX Velocity BMS).// b. NSTEMI 5/17 (demand ischemia) in setting of AF with RVR, pneumonia, CKD, a/c HF >> Myoview 12/26/15: prob inf infarct, no ischemia, EF 52%, Low Risk >> med Rx  . Gastritis    a. 03/2014  . Gout    unconfirmed by joint aspiration  . History of bacteremia    a. 08/2004  Group A Strep bacteremia.  . Hypertensive heart disease   . S/P kidney transplant    a. due to FSGN, follows with Dr. Vivia Birmingham in 2003. Was on HD prior to that.  . Type II diabetes mellitus (HCC)     Past Surgical History:  Procedure Laterality Date  . ABDOMINAL HYSTERECTOMY    . CHEST TUBE INSERTION Right 07/20/2016   Procedure: INSERTION RIGHT PLEURAL DRAINAGE CATHETER;  Surgeon: Kerin Perna, MD;  Location[facility] OR;  Service: Thoracic;  Laterality: Right;  . ESOPHAGOGASTRODUODENOSCOPY N/A 03/20/2014   Procedure: ESOPHAGOGASTRODUODENOSCOPY (EGD);  Surgeon: Theda Belfast, MD;  Location: [facility] ENDOSCOPY;  Service: Endoscopy;  Laterality: N/A;  . KIDNEY TRANSPLANT  2003  . VIDEO BRONCHOSCOPY Bilateral 05/12/2016   Procedure: VIDEO BRONCHOSCOPY WITHOUT FLUORO;  Surgeon: Nyoka Cowden, MD;  Location: [facility] ENDOSCOPY;  Service: Cardiopulmonary;  Laterality: Bilateral;    FAMHx:  Family History  Problem Relation Age of Onset  . CAD Father   .  CAD Brother   . CAD Unknown        Both parents and multiple siblings have died from coronary disease prior to age 78 and several in their 97's and 9's.    SOCHx:   reports that she quit smoking about 47 years ago. She has never used smokeless tobacco. She reports that she does not drink alcohol or use drugs.  ALLERGIES:  Allergies  Allergen Reactions  . Sulfa Drugs Cross Reactors Swelling    ROS: Pertinent items noted in HPI and remainder of comprehensive ROS otherwise negative.  HOME MEDS: Current Outpatient Prescriptions  Medication Sig Dispense Refill  . amLODipine (NORVASC) 10 MG tablet Take 5 mg by mouth daily.    Marland Kitchen apixaban (ELIQUIS) 2.5 MG TABS tablet Take 1 tablet (2.5 mg total) by mouth 2 (two) times daily. Cancel previous Eliquis prescription 60 tablet 3  . famotidine (PEPCID) 20 MG tablet Take 20 mg by mouth 2 (two) times daily.    . furosemide (LASIX) 80 MG tablet Take 80 mg by mouth 2 (two) times daily.     .  metoprolol tartrate (LOPRESSOR) 50 MG tablet Take 1 tablet by mouth 2 (two) times daily.    . mycophenolate (CELLCEPT) 250 MG capsule Take 500 mg by mouth 2 (two) times daily.    Marland Kitchen PROGRAF 1 MG capsule Take 2 mg by mouth 2 (two) times daily.  6   No current facility-administered medications for this visit.     LABS/IMAGING: No results found for this or any previous visit (from the past 48 hour(s)). Dg Chest 2 View  Result Date: 03/22/2017 CLINICAL DATA:  Right pleural effusion with shortness of breath EXAM: CHEST  2 VIEW COMPARISON:  January 20, 2017 FINDINGS: There is a chest tube on the right. There is an focal right pleural effusion but no pneumothorax. There is a slightly smaller left pleural effusion. There is bibasilar atelectasis. There is cardiomegaly with mild pulmonary venous hypertension. There is aortic atherosclerosis. No adenopathy. There is arthropathy in each shoulder. There are foci of coronary artery calcification. IMPRESSION: Chest tube on the right without pneumothorax. There are pleural effusions bilaterally which appear partially loculated. There is bibasilar atelectasis. There is underlying pulmonary vascular congestion. There is aortic atherosclerosis. There are foci of coronary artery calcification. Aortic Atherosclerosis (ICD10-I70.0). Electronically Signed   By: Bretta Bang III [name]   On: 03/22/2017 14:35    WEIGHTS: Wt Readings from Last 3 Encounters:  03/23/17 132 lb (59.9 kg)  03/22/17 132 lb (59.9 kg)  02/07/17 134 lb (60.8 kg)    VITALS: BP 114/63   Pulse (!) 55   Ht 5\' 3"  (1.6 m)   Wt 132 lb (59.9 kg)   SpO2 98%   BMI 23.38 kg/m   EXAM: General appearance: alert and fatigued Neck: no carotid bruit, no JVD and thyroid not enlarged, symmetric, no tenderness/mass/nodules Lungs: diminished breath sounds bilaterally and Right Pleurx catheter Heart: regular rate and rhythm Abdomen: soft, non-tender; bowel sounds normal; no masses,  no  organomegaly Extremities: extremities normal, atraumatic, no cyanosis or edema Pulses: 2+ and symmetric Skin: Skin color, texture, turgor normal. No rashes or lesions Neurologic: Grossly normal Psych: Frustrated  EKG: Deferred  ASSESSMENT: 1. Persistent dyspnea on exertion 2. Coronary artery disease with history of BMS to the RCA in 2002 3. End-stage renal disease status post renal transplant secondary to FSGN 4. Hypertensive heart disease 5. Chronic diastolic heart failure 6. Recent atrial fibrillation - CHADSVASC score of 7  7. Gout 8. Type 2 diabetes 9. Pleural effusion/bilateral infiltrates -status post Pleurx catheter in the right chest  PLAN: 1.   Mrs. Ferger continues to be short of breath and fatigue. I discontinued her amiodarone and heart rate remains in the 50s. She has a Pleurx catheter with continued output. She is on Lasix and appears to be fairly euvolemic. She does have a remote history of coronary disease back in 2002 however given her chronic kidney disease and renal transplant not pursued repeat catheterization. She denies any anginal symptoms. The shortness of breath with exertion is most likely due to the pleural effusions but could be also related to moderate diastolic dysfunction.  Continue current medications. Follow-up in 6 months.  Chrystie Nose, MD, [facility] Attending Cardiologist [facility]  Lisette Abu Hilty 03/23/2017, 1:50 PM

## 2017-03-23 NOTE — Patient Instructions (Signed)
Your physician recommends that you continue on your current medications as directed. Please refer to the Current Medication list given to you today.  Your physician wants you to follow-up in: 6 months with Dr. Hilty. You will receive a reminder letter in the mail two months in advance. If you don't receive a letter, please call our office to schedule the follow-up appointment.  

## 2017-03-24 ENCOUNTER — Other Ambulatory Visit: Payer: Self-pay

## 2017-03-24 DIAGNOSIS — I480 Paroxysmal atrial fibrillation: Secondary | ICD-10-CM

## 2017-03-24 NOTE — Telephone Encounter (Signed)
Requesting blood thinner med to be filled. Please call pt back.

## 2017-03-24 NOTE — Telephone Encounter (Signed)
Pt states she cannot afford the copay, she will see dr Alexandria Lodge tomorrow

## 2017-03-25 NOTE — Telephone Encounter (Signed)
Sample given  Lot# [id number] exp 04/2017 #14 2.5mg 

## 2017-03-28 NOTE — Telephone Encounter (Signed)
Patient did not qualify for the medicare extra help, will work with her to apply for a tier exception

## 2017-04-05 NOTE — Telephone Encounter (Signed)
error 

## 2017-04-06 DIAGNOSIS — J918 Pleural effusion in other conditions classified elsewhere: Secondary | ICD-10-CM | POA: Diagnosis not present

## 2017-04-13 ENCOUNTER — Ambulatory Visit: Payer: Medicare Other | Admitting: Pharmacist

## 2017-04-13 DIAGNOSIS — Z79899 Other long term (current) drug therapy: Secondary | ICD-10-CM

## 2017-04-13 NOTE — Progress Notes (Signed)
Patient states she is still unable to obtain apixaban (Eliquis). Patient was referred to patient assistance program and samples were provided today. Advised patient to contact me if further questions arise.

## 2017-04-27 ENCOUNTER — Encounter (INDEPENDENT_AMBULATORY_CARE_PROVIDER_SITE_OTHER): Payer: Self-pay

## 2017-04-27 ENCOUNTER — Ambulatory Visit (INDEPENDENT_AMBULATORY_CARE_PROVIDER_SITE_OTHER): Payer: Medicare Other | Admitting: Internal Medicine

## 2017-04-27 ENCOUNTER — Encounter: Payer: Self-pay | Admitting: Internal Medicine

## 2017-04-27 ENCOUNTER — Inpatient Hospital Stay (HOSPITAL_COMMUNITY)
Admission: AD | Admit: 2017-04-27 | Discharge: 2017-06-02 | DRG: 177 | Disposition: E | Payer: Medicare Other | Source: Ambulatory Visit | Attending: Student in an Organized Health Care Education/Training Program | Admitting: Student in an Organized Health Care Education/Training Program

## 2017-04-27 ENCOUNTER — Observation Stay (HOSPITAL_COMMUNITY): Payer: Medicare Other

## 2017-04-27 VITALS — BP 140/65 | HR 70 | Temp 97.6°F | Ht 63.0 in | Wt 131.9 lb

## 2017-04-27 DIAGNOSIS — Z7982 Long term (current) use of aspirin: Secondary | ICD-10-CM

## 2017-04-27 DIAGNOSIS — N184 Chronic kidney disease, stage 4 (severe): Secondary | ICD-10-CM | POA: Diagnosis present

## 2017-04-27 DIAGNOSIS — J1529 Pneumonia due to other staphylococcus: Principal | ICD-10-CM | POA: Diagnosis present

## 2017-04-27 DIAGNOSIS — Z955 Presence of coronary angioplasty implant and graft: Secondary | ICD-10-CM

## 2017-04-27 DIAGNOSIS — Z8249 Family history of ischemic heart disease and other diseases of the circulatory system: Secondary | ICD-10-CM

## 2017-04-27 DIAGNOSIS — K5900 Constipation, unspecified: Secondary | ICD-10-CM | POA: Diagnosis present

## 2017-04-27 DIAGNOSIS — I480 Paroxysmal atrial fibrillation: Secondary | ICD-10-CM | POA: Diagnosis present

## 2017-04-27 DIAGNOSIS — R0602 Shortness of breath: Secondary | ICD-10-CM | POA: Diagnosis present

## 2017-04-27 DIAGNOSIS — Z66 Do not resuscitate: Secondary | ICD-10-CM | POA: Diagnosis not present

## 2017-04-27 DIAGNOSIS — R109 Unspecified abdominal pain: Secondary | ICD-10-CM

## 2017-04-27 DIAGNOSIS — R058 Other specified cough: Secondary | ICD-10-CM | POA: Diagnosis present

## 2017-04-27 DIAGNOSIS — I82C13 Acute embolism and thrombosis of internal jugular vein, bilateral: Secondary | ICD-10-CM | POA: Diagnosis present

## 2017-04-27 DIAGNOSIS — Z87891 Personal history of nicotine dependence: Secondary | ICD-10-CM

## 2017-04-27 DIAGNOSIS — E875 Hyperkalemia: Secondary | ICD-10-CM | POA: Diagnosis not present

## 2017-04-27 DIAGNOSIS — Z79899 Other long term (current) drug therapy: Secondary | ICD-10-CM

## 2017-04-27 DIAGNOSIS — J9601 Acute respiratory failure with hypoxia: Secondary | ICD-10-CM | POA: Diagnosis not present

## 2017-04-27 DIAGNOSIS — M109 Gout, unspecified: Secondary | ICD-10-CM | POA: Diagnosis present

## 2017-04-27 DIAGNOSIS — I454 Nonspecific intraventricular block: Secondary | ICD-10-CM | POA: Diagnosis present

## 2017-04-27 DIAGNOSIS — N390 Urinary tract infection, site not specified: Secondary | ICD-10-CM | POA: Diagnosis not present

## 2017-04-27 DIAGNOSIS — I2510 Atherosclerotic heart disease of native coronary artery without angina pectoris: Secondary | ICD-10-CM | POA: Diagnosis present

## 2017-04-27 DIAGNOSIS — J9621 Acute and chronic respiratory failure with hypoxia: Secondary | ICD-10-CM | POA: Diagnosis not present

## 2017-04-27 DIAGNOSIS — R1310 Dysphagia, unspecified: Secondary | ICD-10-CM | POA: Diagnosis present

## 2017-04-27 DIAGNOSIS — R5383 Other fatigue: Secondary | ICD-10-CM | POA: Diagnosis present

## 2017-04-27 DIAGNOSIS — Z882 Allergy status to sulfonamides status: Secondary | ICD-10-CM

## 2017-04-27 DIAGNOSIS — E872 Acidosis: Secondary | ICD-10-CM | POA: Diagnosis not present

## 2017-04-27 DIAGNOSIS — I272 Pulmonary hypertension, unspecified: Secondary | ICD-10-CM | POA: Diagnosis present

## 2017-04-27 DIAGNOSIS — N179 Acute kidney failure, unspecified: Secondary | ICD-10-CM | POA: Diagnosis not present

## 2017-04-27 DIAGNOSIS — I82210 Acute embolism and thrombosis of superior vena cava: Secondary | ICD-10-CM | POA: Diagnosis present

## 2017-04-27 DIAGNOSIS — J9690 Respiratory failure, unspecified, unspecified whether with hypoxia or hypercapnia: Secondary | ICD-10-CM

## 2017-04-27 DIAGNOSIS — I130 Hypertensive heart and chronic kidney disease with heart failure and stage 1 through stage 4 chronic kidney disease, or unspecified chronic kidney disease: Secondary | ICD-10-CM | POA: Diagnosis present

## 2017-04-27 DIAGNOSIS — E860 Dehydration: Secondary | ICD-10-CM | POA: Diagnosis present

## 2017-04-27 DIAGNOSIS — R05 Cough: Secondary | ICD-10-CM | POA: Diagnosis present

## 2017-04-27 DIAGNOSIS — I5033 Acute on chronic diastolic (congestive) heart failure: Secondary | ICD-10-CM | POA: Diagnosis not present

## 2017-04-27 DIAGNOSIS — R627 Adult failure to thrive: Secondary | ICD-10-CM | POA: Diagnosis present

## 2017-04-27 DIAGNOSIS — J189 Pneumonia, unspecified organism: Secondary | ICD-10-CM | POA: Diagnosis present

## 2017-04-27 DIAGNOSIS — B957 Other staphylococcus as the cause of diseases classified elsewhere: Secondary | ICD-10-CM | POA: Clinically undetermined

## 2017-04-27 DIAGNOSIS — R630 Anorexia: Secondary | ICD-10-CM | POA: Diagnosis present

## 2017-04-27 DIAGNOSIS — Z515 Encounter for palliative care: Secondary | ICD-10-CM | POA: Diagnosis not present

## 2017-04-27 DIAGNOSIS — Z7901 Long term (current) use of anticoagulants: Secondary | ICD-10-CM

## 2017-04-27 DIAGNOSIS — E871 Hypo-osmolality and hyponatremia: Secondary | ICD-10-CM | POA: Diagnosis not present

## 2017-04-27 DIAGNOSIS — Z9889 Other specified postprocedural states: Secondary | ICD-10-CM

## 2017-04-27 DIAGNOSIS — I871 Compression of vein: Secondary | ICD-10-CM | POA: Diagnosis present

## 2017-04-27 DIAGNOSIS — Z940 Kidney transplant status: Secondary | ICD-10-CM

## 2017-04-27 DIAGNOSIS — E46 Unspecified protein-calorie malnutrition: Secondary | ICD-10-CM | POA: Diagnosis present

## 2017-04-27 DIAGNOSIS — J90 Pleural effusion, not elsewhere classified: Secondary | ICD-10-CM

## 2017-04-27 DIAGNOSIS — E43 Unspecified severe protein-calorie malnutrition: Secondary | ICD-10-CM | POA: Diagnosis not present

## 2017-04-27 DIAGNOSIS — Z9225 Personal history of immunosupression therapy: Secondary | ICD-10-CM

## 2017-04-27 DIAGNOSIS — E1122 Type 2 diabetes mellitus with diabetic chronic kidney disease: Secondary | ICD-10-CM | POA: Diagnosis present

## 2017-04-27 DIAGNOSIS — E8809 Other disorders of plasma-protein metabolism, not elsewhere classified: Secondary | ICD-10-CM | POA: Diagnosis present

## 2017-04-27 DIAGNOSIS — A498 Other bacterial infections of unspecified site: Secondary | ICD-10-CM | POA: Clinically undetermined

## 2017-04-27 DIAGNOSIS — Z6826 Body mass index (BMI) 26.0-26.9, adult: Secondary | ICD-10-CM

## 2017-04-27 HISTORY — DX: Dyspnea, unspecified: R06.00

## 2017-04-27 HISTORY — DX: Pneumonia, unspecified organism: J18.9

## 2017-04-27 LAB — CBC WITH DIFFERENTIAL/PLATELET
BASOS PCT: 0 %
Basophils Absolute: 0 10*3/uL (ref 0.0–0.1)
EOS ABS: 0.6 10*3/uL (ref 0.0–0.7)
EOS PCT: 8 %
HEMATOCRIT: 44.4 % (ref 36.0–46.0)
Hemoglobin: 14.5 g/dL (ref 12.0–15.0)
Lymphocytes Relative: 21 %
Lymphs Abs: 1.6 10*3/uL (ref 0.7–4.0)
MCH: 29.9 pg (ref 26.0–34.0)
MCHC: 32.7 g/dL (ref 30.0–36.0)
MCV: 91.5 fL (ref 78.0–100.0)
MONO ABS: 1 10*3/uL (ref 0.1–1.0)
MONOS PCT: 13 %
Neutro Abs: 4.4 10*3/uL (ref 1.7–7.7)
Neutrophils Relative %: 58 %
PLATELETS: 223 10*3/uL (ref 150–400)
RBC: 4.85 MIL/uL (ref 3.87–5.11)
RDW: 13.8 % (ref 11.5–15.5)
WBC: 7.6 10*3/uL (ref 4.0–10.5)

## 2017-04-27 LAB — COMPREHENSIVE METABOLIC PANEL
ALBUMIN: 3.1 g/dL — AB (ref 3.5–5.0)
ALT: 10 U/L — ABNORMAL LOW (ref 14–54)
ANION GAP: 7 (ref 5–15)
AST: 22 U/L (ref 15–41)
Alkaline Phosphatase: 89 U/L (ref 38–126)
BILIRUBIN TOTAL: 0.6 mg/dL (ref 0.3–1.2)
BUN: 15 mg/dL (ref 6–20)
CHLORIDE: 100 mmol/L — AB (ref 101–111)
CO2: 28 mmol/L (ref 22–32)
Calcium: 10 mg/dL (ref 8.9–10.3)
Creatinine, Ser: 1.57 mg/dL — ABNORMAL HIGH (ref 0.44–1.00)
GFR calc Af Amer: 34 mL/min — ABNORMAL LOW (ref >60)
GFR calc non Af Amer: 29 mL/min — ABNORMAL LOW (ref >60)
GLUCOSE: 116 mg/dL — AB (ref 65–99)
POTASSIUM: 4 mmol/L (ref 3.5–5.1)
SODIUM: 135 mmol/L (ref 135–145)
Total Protein: 6.2 g/dL — ABNORMAL LOW (ref 6.5–8.1)

## 2017-04-27 MED ORDER — ACETAMINOPHEN 325 MG PO TABS
650.0000 mg | ORAL_TABLET | Freq: Four times a day (QID) | ORAL | Status: DC | PRN
  Administered 2017-04-28 – 2017-05-18 (×13): 650 mg via ORAL
  Filled 2017-04-27 (×14): qty 2

## 2017-04-27 MED ORDER — FAMOTIDINE 20 MG PO TABS
20.0000 mg | ORAL_TABLET | Freq: Two times a day (BID) | ORAL | Status: DC
  Administered 2017-04-27 – 2017-04-28 (×2): 20 mg via ORAL
  Filled 2017-04-27 (×2): qty 1

## 2017-04-27 MED ORDER — PROMETHAZINE HCL 25 MG PO TABS
12.5000 mg | ORAL_TABLET | Freq: Four times a day (QID) | ORAL | Status: DC | PRN
  Administered 2017-05-06 – 2017-05-19 (×3): 12.5 mg via ORAL
  Filled 2017-04-27 (×3): qty 1

## 2017-04-27 MED ORDER — MYCOPHENOLATE MOFETIL 250 MG PO CAPS
500.0000 mg | ORAL_CAPSULE | Freq: Two times a day (BID) | ORAL | Status: DC
  Administered 2017-04-27 – 2017-05-18 (×39): 500 mg via ORAL
  Filled 2017-04-27 (×43): qty 2

## 2017-04-27 MED ORDER — DEXTROSE 5 % IV SOLN
1.0000 g | INTRAVENOUS | Status: DC
  Filled 2017-04-27: qty 1

## 2017-04-27 MED ORDER — SENNOSIDES-DOCUSATE SODIUM 8.6-50 MG PO TABS
1.0000 | ORAL_TABLET | Freq: Every evening | ORAL | Status: DC | PRN
  Administered 2017-04-30: 1 via ORAL
  Filled 2017-04-27 (×2): qty 1

## 2017-04-27 MED ORDER — METOPROLOL TARTRATE 50 MG PO TABS
50.0000 mg | ORAL_TABLET | Freq: Two times a day (BID) | ORAL | Status: DC
  Administered 2017-04-27 – 2017-04-28 (×2): 50 mg via ORAL
  Filled 2017-04-27 (×3): qty 1

## 2017-04-27 MED ORDER — AMLODIPINE BESYLATE 5 MG PO TABS
5.0000 mg | ORAL_TABLET | Freq: Every day | ORAL | Status: DC
  Administered 2017-04-28 – 2017-05-16 (×16): 5 mg via ORAL
  Filled 2017-04-27 (×23): qty 1

## 2017-04-27 MED ORDER — TACROLIMUS 1 MG PO CAPS
2.0000 mg | ORAL_CAPSULE | Freq: Two times a day (BID) | ORAL | Status: DC
  Administered 2017-04-27 – 2017-05-18 (×39): 2 mg via ORAL
  Filled 2017-04-27 (×45): qty 2

## 2017-04-27 MED ORDER — FUROSEMIDE 80 MG PO TABS
80.0000 mg | ORAL_TABLET | Freq: Two times a day (BID) | ORAL | Status: DC
  Administered 2017-04-27 – 2017-04-28 (×2): 80 mg via ORAL
  Filled 2017-04-27 (×2): qty 1

## 2017-04-27 MED ORDER — ACETAMINOPHEN 650 MG RE SUPP: 650.0000 mg | Freq: Four times a day (QID) | RECTAL | Status: DC | PRN

## 2017-04-27 MED ORDER — APIXABAN 2.5 MG PO TABS
2.5000 mg | ORAL_TABLET | Freq: Two times a day (BID) | ORAL | Status: AC
  Administered 2017-04-27 – 2017-05-04 (×14): 2.5 mg via ORAL
  Filled 2017-04-27 (×15): qty 1

## 2017-04-27 NOTE — H&P (Signed)
Date: 04/22/2017               Patient Name:  [name] MRN: [id number]  DOB: 06/09/1932 Age / Sex: 83 y.o., female   PCP: Toney Rakes, MD         Medical Service: Internal Medicine Teaching Service         Attending Physician: Dr. Inez Catalina, MD    First Contact: Dr. Crista Elliot Pager: [phone]  Second Contact: Dr. Nelson Chimes Pager: [phone]       After Hours (After 5p/  First Contact Pager: [phone]  weekends / holidays): Second Contact Pager: [phone]   Chief Complaint: "I feel very tired and weak, just wiped out"  History of Present Illness: [name] is an 83 year old female who presented with a three-week history of cough, increased sputum production, shortness of breath on exertion, and worsening fatigue. 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 Fib, chronic respiratory failure with hypoxia, gout and coronary artery disease status post PCI. The patient states that she has had a cough for 3 weeks which is associated with the production of white and sometimes green sputum. She has felt weak for the past year since she was admitted to the hospital on four separate occasions for pneumonia. She attests to anorexia on most days, stating that she simply has no desire to eat. The patient stated that in the past she has typically waited until EMS was necessary for transport due to her severe weakness and in generally poor medical condition. On this occasion she called to schedule her own appointment and she felt so weak she was unable to make a cup of coffee without pausing multiple times to regroup.  She does 2 chills first greater than 6 months, nausea but no vomiting, weight loss of 31 pounds since last year, cough, shortness of breath, generalized weakness and fatigue. In addition she states she often has serosanguineous discharge from her right pleurex catheter for recurrent pleural  effusion.   She was admitted from the clinic with concerns of pneumonia in conjunction with her immunosuppressive state.  Meds:  No outpatient prescriptions have been marked as taking for the 05/01/2017 encounter [facility] Encounter).   Allergies: Allergies as of 04/11/2017 - Review Complete 04/10/2017  Allergen Reaction Noted  . Sulfa drugs cross reactors Swelling 12/03/2011   Past Medical History:  Diagnosis Date  . Candidal esophagitis (HCC)    a. 03/2014.  Marland Kitchen Cholelithiasis   . Chronic diastolic CHF (congestive heart failure) (HCC)    a. 06/2011 Echo: EF 60-65%, no rwma, Gr1 DD, mild TR. // b. Echo 12/19/15: Severe LVH, EF 55-60%, normal wall motion, mild MR, moderate LAE, moderate TR, PASP 44 mmHg  . CKD (chronic kidney disease), stage III    a. in setting of prior ESRD and cadaveric tx in 2003.  Marland Kitchen Coronary artery disease    a. 01/2001 Cath/PCI: LAD 50p, 72m, D1 50-60, RCA 35m (3.0x13 BX Velocity BMS).// b. NSTEMI 5/17 (demand ischemia) in setting of AF with RVR, pneumonia, CKD, a/c HF >> Myoview 12/26/15: prob inf infarct, no ischemia, EF 52%, Low Risk >> med Rx  . Gastritis    a. 03/2014  . Gout    unconfirmed by joint aspiration  . History of bacteremia    a. 08/2004 Group A Strep bacteremia.  . Hypertensive heart disease   . S/P kidney transplant    a.  due to [facility], follows with Dr. Vivia Birmingham in 2003. Was on HD prior to that.  . Type II diabetes mellitus (HCC)     Family History:  Family History  Problem Relation Age of Onset  . CAD Father   . CAD Brother   . CAD Unknown        Both parents and multiple siblings have died from coronary disease prior to age 43 and several in their 39's and 86's.   Social History:  Social History  Substance Use Topics  . Smoking status: Former Smoker    Quit date: 04/19/1969  . Smokeless tobacco: Never Used  . Alcohol use No   Review of Systems: A complete ROS was negative except as per HPI.   Physical Exam: There were  no vitals taken for this visit. Physical Exam  Constitutional: She is oriented to person, place, and time. No distress.  HENT:  Head: Normocephalic and atraumatic.  Eyes: Conjunctivae and EOM are normal.  Neck: Normal range of motion. Neck supple.  Cardiovascular: Normal rate and regular rhythm.   No murmur heard. Pulmonary/Chest: Effort normal. No respiratory distress. She has decreased breath sounds in the right lower field. She has wheezes in the right upper field, the right middle field, the left upper field and the left middle field. She exhibits no tenderness.  Abdominal: Soft. Bowel sounds are normal. She exhibits no distension. There is no tenderness.  Musculoskeletal: She exhibits edema (2+). She exhibits no tenderness.  Neurological: She is alert and oriented to person, place, and time.  Skin: Skin is warm.  Psychiatric: She has a normal mood and affect.   EKG: personally reviewed my interpretation is n/a  CXR: personally reviewed my interpretation is n/a  Assessment & Plan by Problem: Active Problems:   Pleural effusion, bilateral   CAP (community acquired pneumonia)   Fatigue   Shortness of breath   Cough with sputum   [name] an 83 year old female who presented with generalized weakness, shortness of breath with associated cough and sputum production, with a history of bilateral pleural effusions with pleural catheter on the right to the IMTS clinic. Given the patient's history of renal transplantation chronic immunosuppression she may very well be suffering from pneumonia with decreased immune response. In addition she has attested to serosanguineous discharge from the pleural catheter on several occasions this past month. Included in the differential will continue deconditioning, acute on chronic heart failure exacerbation, tumor, pulmonary embolism and acute coronary syndrome. She is to be admitted to the floor for treatment of pneumonia and fatigue.   CAP, SOB w/  cough and sputum production: Patient with three-week history of cough, sputum production, shortness of breath, and chills -CBC unremarkable, no leukocytosis, as patient is on chronic immunosuppression as could be expected -CMP remarkable for creatinine of 1.57, albumin 3.1 in the clinic -chest x-ray pending admission -EKG not performed will order following admission   Fatigue: Most likely secondary to physical deconditioning, diastolic CHF, Pneumonia, malnutrition Will rehydrate as appropriate Treat for possible pneumonia  Increase nutrition intake Recent Echo stable as of July 2018  Hypoalbuminemia: Most likely secondary to decreased PO intake  Will monitor  Diastolic CHF: Most recent Echo in 2018 impression: "Compared to a prior study in 2017, the LVEF is stable. There is severe LVH with grade 2 DD and elevated LV filling pressures. TR jet could not be measured."   Diet: Code: DNI but all else acceptable, Patient was specific, she will not be intubated but  would like all other intervention. Fluids: None DVD PPX: heparin   Dispo: Admit patient to Inpatient with expected length of stay greater than 2 midnights.  Signed: Lanelle Bal, MD 04/16/2017, 6:31 PM  Pager: Pager# [phone]

## 2017-04-27 NOTE — Patient Instructions (Signed)
[name], you will be admitted for further work-up of your shortness of breath.

## 2017-04-27 NOTE — Progress Notes (Signed)
   CC: productive cough, chills  HPI:  [name] is a 83 y.o. F here for evaluation of a 3 week history of productive cough (green, sometimes clear sputum), subjective fevers, chills, weakness and SOB. She has hx of similar episodes when she's had pneumonia in the past and states this feels the same. Denies sick contacts. She lives home alone however reports she has some neighbors who would come check on her if needed. She feels 'wiped out' and notes shes unable to ambulate more than 4 feet due to weakness and SOB.   Of note, the patient does have medical history significant for renal transplant (2/2 ESRD from FSGN) on prograf and cellcept. Has right pleurex cath secondary to recurring pleural effusions. Cytology evaluation negative for malignancy.   Past Medical History:  Diagnosis Date  . Candidal esophagitis (HCC)    a. 03/2014.  Marland Kitchen Cholelithiasis   . Chronic diastolic CHF (congestive heart failure) (HCC)    a. 06/2011 Echo: EF 60-65%, no rwma, Gr1 DD, mild TR. // b. Echo 12/19/15: Severe LVH, EF 55-60%, normal wall motion, mild MR, moderate LAE, moderate TR, PASP 44 mmHg  . CKD (chronic kidney disease), stage III    a. in setting of prior ESRD and cadaveric tx in 2003.  Marland Kitchen Coronary artery disease    a. 01/2001 Cath/PCI: LAD 50p, 73m, D1 50-60, RCA 11m (3.0x13 BX Velocity BMS).// b. NSTEMI 5/17 (demand ischemia) in setting of AF with RVR, pneumonia, CKD, a/c HF >> Myoview 12/26/15: prob inf infarct, no ischemia, EF 52%, Low Risk >> med Rx  . Gastritis    a. 03/2014  . Gout    unconfirmed by joint aspiration  . History of bacteremia    a. 08/2004 Group A Strep bacteremia.  . Hypertensive heart disease   . S/P kidney transplant    a. due to FSGN, follows with Dr. Vivia [name] in 2003. Was on HD prior to that.  . Type II diabetes mellitus (HCC)    Review of Systems:   General: +FEVER, CHILLS, FATIGUE.  HEENT:  Denies changes in vision, sore throat, dysphagia Cardiac: +SOB  (mild at rest, significant with ambulation)Denies CP Pulmonary: +Cough (productive), SOB Abd: Denies diarrhea, constipation, changes in bowels Extremities: +malaise, diffuse weakness. Denies swelling  Physical Exam: General: Alert, sitting in wheelchair, draped with 2 blankets. Appears fatigued. HEENT: No icterus, injection or ptosis. No hoarseness or dysarthria  Cardiac: RRR Pulmonary: Lungs grossly clear except area rales in RLL.  Abd: Soft, non-tended. +bs Extremities: Warm, perfused. No significant pedal edema.   Vitals:   04/10/2017 1323  BP: (!) 130/57  Pulse: 67  Temp: 97.6 F (36.4 C)  TempSrc: Oral  SpO2: 98%  Weight: 131 lb 14.4 oz (59.8 kg)  Height:  (1.6 m)   Assessment & Plan:   See Encounters Tab for problem based charting.  Patient seen with Dr. Heide Spark

## 2017-04-27 NOTE — Assessment & Plan Note (Addendum)
Assessment: This is an 83 y/o F here on chronic immunosuppression with 3 week history of progressive productive cough, subjective fevers, chills, SOB and generalized weakness/fatigue. She avoids going to the doctor at all costs however felt so bad she made her own appointment. She states this feels very similar to the last time she had pneumonia. Exam shows a frail elderly woman wrapped in blankets and appears fatigued. She does have crackles in RLL however this is the side of her recurrent pleural effusion, for which she has a pleurex catheter which was drained yesterday. She does have hx of CHF on chronic lasix however does not appear volume up on examination.  Plan: I'm concerned about the possibility of infection as well as her generalized weakness/fatigue. She notes significant SOB and weakness with ambulation of less than 4 feet.  -Admit to obs -CBC and CMET -Will need lung imaging -Discussed with admitting team who accept the patient -Discussed plan with family

## 2017-04-27 NOTE — Progress Notes (Signed)
Pharmacy Antibiotic Note  [name] is a 83 y.o. female admitted on 04/21/2017 with weakness.  Patient has increased sputum production, SOB on exertion, and 3 week history of cough.  Pharmacy has been consulted for cefepime dosing for HCAP.  Noted that patient is s/p kidney transplant and is on chronic suppressive medications.   Plan: Cefepime 1gm IV Q24H Monitor renal fxn, clinical progress  Height:  (160 cm) Weight: 131 lb 13.4 oz (59.8 kg) IBW/kg (Calculated) : 52.4  Temp (24hrs), Avg:98.1 F (36.7 C), Min:97.6 F (36.4 C), Max:98.6 F (37 C)   Recent Labs Lab 04/09/2017 1413  WBC 7.6  CREATININE 1.57*    Estimated Creatinine Clearance: 22.5 mL/min (A) (by C-G formula based on SCr of 1.57 mg/dL (H)).    Allergies  Allergen Reactions  . Sulfa Drugs Cross Reactors Swelling    Cefepime 9/26 >>  9/26 BCx -    Marshon Bangs D. Laney Potash, PharmD, BCPS Pager:  [phone] 04/23/2017, 7:49 PM

## 2017-04-28 ENCOUNTER — Observation Stay (HOSPITAL_COMMUNITY): Payer: Medicare Other

## 2017-04-28 ENCOUNTER — Encounter (HOSPITAL_COMMUNITY): Payer: Self-pay | Admitting: General Practice

## 2017-04-28 DIAGNOSIS — N178 Other acute kidney failure: Secondary | ICD-10-CM | POA: Diagnosis not present

## 2017-04-28 DIAGNOSIS — J189 Pneumonia, unspecified organism: Secondary | ICD-10-CM | POA: Diagnosis not present

## 2017-04-28 DIAGNOSIS — R627 Adult failure to thrive: Secondary | ICD-10-CM | POA: Diagnosis not present

## 2017-04-28 DIAGNOSIS — I480 Paroxysmal atrial fibrillation: Secondary | ICD-10-CM | POA: Diagnosis not present

## 2017-04-28 DIAGNOSIS — N189 Chronic kidney disease, unspecified: Secondary | ICD-10-CM | POA: Diagnosis not present

## 2017-04-28 DIAGNOSIS — E860 Dehydration: Secondary | ICD-10-CM

## 2017-04-28 DIAGNOSIS — B957 Other staphylococcus as the cause of diseases classified elsewhere: Secondary | ICD-10-CM | POA: Diagnosis not present

## 2017-04-28 DIAGNOSIS — N2889 Other specified disorders of kidney and ureter: Secondary | ICD-10-CM | POA: Diagnosis not present

## 2017-04-28 DIAGNOSIS — N390 Urinary tract infection, site not specified: Secondary | ICD-10-CM | POA: Diagnosis not present

## 2017-04-28 DIAGNOSIS — J9601 Acute respiratory failure with hypoxia: Secondary | ICD-10-CM | POA: Diagnosis not present

## 2017-04-28 DIAGNOSIS — M109 Gout, unspecified: Secondary | ICD-10-CM

## 2017-04-28 DIAGNOSIS — R10814 Left lower quadrant abdominal tenderness: Secondary | ICD-10-CM | POA: Diagnosis not present

## 2017-04-28 DIAGNOSIS — R05 Cough: Secondary | ICD-10-CM | POA: Diagnosis not present

## 2017-04-28 DIAGNOSIS — Z66 Do not resuscitate: Secondary | ICD-10-CM | POA: Diagnosis not present

## 2017-04-28 DIAGNOSIS — R0602 Shortness of breath: Secondary | ICD-10-CM | POA: Diagnosis not present

## 2017-04-28 DIAGNOSIS — J9811 Atelectasis: Secondary | ICD-10-CM | POA: Diagnosis not present

## 2017-04-28 DIAGNOSIS — Z9225 Personal history of immunosupression therapy: Secondary | ICD-10-CM | POA: Diagnosis not present

## 2017-04-28 DIAGNOSIS — I2510 Atherosclerotic heart disease of native coronary artery without angina pectoris: Secondary | ICD-10-CM | POA: Diagnosis not present

## 2017-04-28 DIAGNOSIS — N184 Chronic kidney disease, stage 4 (severe): Secondary | ICD-10-CM | POA: Diagnosis not present

## 2017-04-28 DIAGNOSIS — I82C13 Acute embolism and thrombosis of internal jugular vein, bilateral: Secondary | ICD-10-CM | POA: Diagnosis not present

## 2017-04-28 DIAGNOSIS — E43 Unspecified severe protein-calorie malnutrition: Secondary | ICD-10-CM | POA: Diagnosis not present

## 2017-04-28 DIAGNOSIS — N186 End stage renal disease: Secondary | ICD-10-CM

## 2017-04-28 DIAGNOSIS — E8809 Other disorders of plasma-protein metabolism, not elsewhere classified: Secondary | ICD-10-CM

## 2017-04-28 DIAGNOSIS — E46 Unspecified protein-calorie malnutrition: Secondary | ICD-10-CM | POA: Diagnosis not present

## 2017-04-28 DIAGNOSIS — R918 Other nonspecific abnormal finding of lung field: Secondary | ICD-10-CM | POA: Diagnosis not present

## 2017-04-28 DIAGNOSIS — I132 Hypertensive heart and chronic kidney disease with heart failure and with stage 5 chronic kidney disease, or end stage renal disease: Secondary | ICD-10-CM

## 2017-04-28 DIAGNOSIS — I5033 Acute on chronic diastolic (congestive) heart failure: Secondary | ICD-10-CM | POA: Diagnosis not present

## 2017-04-28 DIAGNOSIS — N179 Acute kidney failure, unspecified: Secondary | ICD-10-CM | POA: Diagnosis not present

## 2017-04-28 DIAGNOSIS — I871 Compression of vein: Secondary | ICD-10-CM | POA: Diagnosis not present

## 2017-04-28 DIAGNOSIS — T8579XA Infection and inflammatory reaction due to other internal prosthetic devices, implants and grafts, initial encounter: Secondary | ICD-10-CM | POA: Diagnosis not present

## 2017-04-28 DIAGNOSIS — N281 Cyst of kidney, acquired: Secondary | ICD-10-CM | POA: Diagnosis not present

## 2017-04-28 DIAGNOSIS — I110 Hypertensive heart disease with heart failure: Secondary | ICD-10-CM | POA: Diagnosis not present

## 2017-04-28 DIAGNOSIS — I130 Hypertensive heart and chronic kidney disease with heart failure and stage 1 through stage 4 chronic kidney disease, or unspecified chronic kidney disease: Secondary | ICD-10-CM | POA: Diagnosis not present

## 2017-04-28 DIAGNOSIS — I82210 Acute embolism and thrombosis of superior vena cava: Secondary | ICD-10-CM | POA: Diagnosis present

## 2017-04-28 DIAGNOSIS — Z8709 Personal history of other diseases of the respiratory system: Secondary | ICD-10-CM

## 2017-04-28 DIAGNOSIS — J90 Pleural effusion, not elsewhere classified: Secondary | ICD-10-CM | POA: Diagnosis not present

## 2017-04-28 DIAGNOSIS — E872 Acidosis: Secondary | ICD-10-CM | POA: Diagnosis not present

## 2017-04-28 DIAGNOSIS — J1529 Pneumonia due to other staphylococcus: Secondary | ICD-10-CM | POA: Diagnosis not present

## 2017-04-28 DIAGNOSIS — J869 Pyothorax without fistula: Secondary | ICD-10-CM | POA: Diagnosis not present

## 2017-04-28 DIAGNOSIS — R5383 Other fatigue: Secondary | ICD-10-CM | POA: Diagnosis not present

## 2017-04-28 DIAGNOSIS — R630 Anorexia: Secondary | ICD-10-CM | POA: Diagnosis not present

## 2017-04-28 DIAGNOSIS — K8020 Calculus of gallbladder without cholecystitis without obstruction: Secondary | ICD-10-CM | POA: Diagnosis not present

## 2017-04-28 DIAGNOSIS — Z86718 Personal history of other venous thrombosis and embolism: Secondary | ICD-10-CM | POA: Diagnosis not present

## 2017-04-28 DIAGNOSIS — I340 Nonrheumatic mitral (valve) insufficiency: Secondary | ICD-10-CM | POA: Diagnosis not present

## 2017-04-28 DIAGNOSIS — I878 Other specified disorders of veins: Secondary | ICD-10-CM

## 2017-04-28 DIAGNOSIS — R846 Abnormal cytological findings in specimens from respiratory organs and thorax: Secondary | ICD-10-CM | POA: Diagnosis not present

## 2017-04-28 DIAGNOSIS — Z978 Presence of other specified devices: Secondary | ICD-10-CM

## 2017-04-28 DIAGNOSIS — I700 Atherosclerosis of aorta: Secondary | ICD-10-CM

## 2017-04-28 DIAGNOSIS — D72829 Elevated white blood cell count, unspecified: Secondary | ICD-10-CM | POA: Diagnosis not present

## 2017-04-28 DIAGNOSIS — Z8701 Personal history of pneumonia (recurrent): Secondary | ICD-10-CM

## 2017-04-28 DIAGNOSIS — Z515 Encounter for palliative care: Secondary | ICD-10-CM | POA: Diagnosis not present

## 2017-04-28 DIAGNOSIS — R001 Bradycardia, unspecified: Secondary | ICD-10-CM

## 2017-04-28 DIAGNOSIS — E871 Hypo-osmolality and hyponatremia: Secondary | ICD-10-CM | POA: Diagnosis not present

## 2017-04-28 DIAGNOSIS — I454 Nonspecific intraventricular block: Secondary | ICD-10-CM | POA: Diagnosis present

## 2017-04-28 DIAGNOSIS — I272 Pulmonary hypertension, unspecified: Secondary | ICD-10-CM | POA: Diagnosis not present

## 2017-04-28 DIAGNOSIS — I5030 Unspecified diastolic (congestive) heart failure: Secondary | ICD-10-CM | POA: Diagnosis not present

## 2017-04-28 DIAGNOSIS — J9621 Acute and chronic respiratory failure with hypoxia: Secondary | ICD-10-CM | POA: Diagnosis not present

## 2017-04-28 DIAGNOSIS — R848 Other abnormal findings in specimens from respiratory organs and thorax: Secondary | ICD-10-CM | POA: Diagnosis not present

## 2017-04-28 DIAGNOSIS — R221 Localized swelling, mass and lump, neck: Secondary | ICD-10-CM | POA: Diagnosis not present

## 2017-04-28 DIAGNOSIS — Z7901 Long term (current) use of anticoagulants: Secondary | ICD-10-CM

## 2017-04-28 DIAGNOSIS — J9610 Chronic respiratory failure, unspecified whether with hypoxia or hypercapnia: Secondary | ICD-10-CM

## 2017-04-28 DIAGNOSIS — R10813 Right lower quadrant abdominal tenderness: Secondary | ICD-10-CM | POA: Diagnosis not present

## 2017-04-28 DIAGNOSIS — R109 Unspecified abdominal pain: Secondary | ICD-10-CM | POA: Diagnosis not present

## 2017-04-28 DIAGNOSIS — Z6826 Body mass index (BMI) 26.0-26.9, adult: Secondary | ICD-10-CM | POA: Diagnosis not present

## 2017-04-28 DIAGNOSIS — R0600 Dyspnea, unspecified: Secondary | ICD-10-CM | POA: Diagnosis not present

## 2017-04-28 DIAGNOSIS — Z940 Kidney transplant status: Secondary | ICD-10-CM | POA: Diagnosis not present

## 2017-04-28 DIAGNOSIS — E875 Hyperkalemia: Secondary | ICD-10-CM | POA: Diagnosis not present

## 2017-04-28 DIAGNOSIS — I5032 Chronic diastolic (congestive) heart failure: Secondary | ICD-10-CM | POA: Diagnosis not present

## 2017-04-28 DIAGNOSIS — R0902 Hypoxemia: Secondary | ICD-10-CM | POA: Diagnosis not present

## 2017-04-28 DIAGNOSIS — J984 Other disorders of lung: Secondary | ICD-10-CM | POA: Diagnosis not present

## 2017-04-28 DIAGNOSIS — E1122 Type 2 diabetes mellitus with diabetic chronic kidney disease: Secondary | ICD-10-CM | POA: Diagnosis not present

## 2017-04-28 DIAGNOSIS — Z87891 Personal history of nicotine dependence: Secondary | ICD-10-CM

## 2017-04-28 DIAGNOSIS — I2721 Secondary pulmonary arterial hypertension: Secondary | ICD-10-CM | POA: Diagnosis not present

## 2017-04-28 DIAGNOSIS — Z79899 Other long term (current) drug therapy: Secondary | ICD-10-CM | POA: Diagnosis not present

## 2017-04-28 LAB — PROTEIN, PLEURAL OR PERITONEAL FLUID: Total protein, fluid: <3 g/dL

## 2017-04-28 LAB — ALBUMIN, PLEURAL OR PERITONEAL FLUID: Albumin, Fluid: 1.3 g/dL

## 2017-04-28 LAB — GLUCOSE, PLEURAL OR PERITONEAL FLUID: Glucose, Fluid: 106 mg/dL

## 2017-04-28 LAB — GRAM STAIN

## 2017-04-28 LAB — BODY FLUID CELL COUNT WITH DIFFERENTIAL
EOS FL: 0 %
Lymphs, Fluid: 6 %
MONOCYTE-MACROPHAGE-SEROUS FLUID: 4 % — AB (ref 50–90)
NEUTROPHIL FLUID: 90 % — AB (ref 0–25)
WBC FLUID: 5846 uL — AB (ref 0–1000)

## 2017-04-28 LAB — BASIC METABOLIC PANEL
ANION GAP: 8 (ref 5–15)
BUN: 12 mg/dL (ref 6–20)
CALCIUM: 9.5 mg/dL (ref 8.9–10.3)
CO2: 25 mmol/L (ref 22–32)
Chloride: 102 mmol/L (ref 101–111)
Creatinine, Ser: 1.44 mg/dL — ABNORMAL HIGH (ref 0.44–1.00)
GFR, EST AFRICAN AMERICAN: 38 mL/min — AB (ref >60)
GFR, EST NON AFRICAN AMERICAN: 33 mL/min — AB (ref >60)
Glucose, Bld: 108 mg/dL — ABNORMAL HIGH (ref 65–99)
POTASSIUM: 3.5 mmol/L (ref 3.5–5.1)
SODIUM: 135 mmol/L (ref 135–145)

## 2017-04-28 LAB — CBC
HCT: 39.4 % (ref 36.0–46.0)
Hemoglobin: 12.8 g/dL (ref 12.0–15.0)
MCH: 29.4 pg (ref 26.0–34.0)
MCHC: 32.5 g/dL (ref 30.0–36.0)
MCV: 90.6 fL (ref 78.0–100.0)
PLATELETS: 215 10*3/uL (ref 150–400)
RBC: 4.35 MIL/uL (ref 3.87–5.11)
RDW: 13.8 % (ref 11.5–15.5)
WBC: 6.5 10*3/uL (ref 4.0–10.5)

## 2017-04-28 LAB — LACTATE DEHYDROGENASE, PLEURAL OR PERITONEAL FLUID: LD FL: 96 U/L — AB (ref 3–23)

## 2017-04-28 LAB — MRSA PCR SCREENING: MRSA by PCR: POSITIVE — AB

## 2017-04-28 LAB — LACTATE DEHYDROGENASE: LDH: 156 U/L (ref 98–192)

## 2017-04-28 MED ORDER — MUPIROCIN 2 % EX OINT
1.0000 "application " | TOPICAL_OINTMENT | Freq: Two times a day (BID) | CUTANEOUS | Status: AC
  Administered 2017-04-28 – 2017-05-02 (×9): 1 via NASAL
  Filled 2017-04-28 (×8): qty 22

## 2017-04-28 MED ORDER — DEXTROSE 5 % IV SOLN
1.0000 g | INTRAVENOUS | Status: DC
  Administered 2017-04-28 – 2017-05-01 (×4): 1 g via INTRAVENOUS
  Filled 2017-04-28 (×5): qty 1

## 2017-04-28 MED ORDER — CHLORHEXIDINE GLUCONATE CLOTH 2 % EX PADS
6.0000 | MEDICATED_PAD | Freq: Every day | CUTANEOUS | Status: AC
  Administered 2017-04-29 – 2017-04-30 (×2): 6 via TOPICAL

## 2017-04-28 MED ORDER — DEXTROSE-NACL 5-0.9 % IV SOLN
INTRAVENOUS | Status: AC
  Administered 2017-04-28: 15:00:00 via INTRAVENOUS

## 2017-04-28 MED ORDER — FAMOTIDINE 20 MG PO TABS
20.0000 mg | ORAL_TABLET | Freq: Every day | ORAL | Status: DC
  Administered 2017-04-29 – 2017-05-17 (×18): 20 mg via ORAL
  Filled 2017-04-28 (×21): qty 1

## 2017-04-28 NOTE — Evaluation (Signed)
Physical Therapy Evaluation Patient Details Name: [name] MRN: [id number] DOB: [birth year] Today's Date: 04/28/2017   History of Present Illness  [name] is an 83 year old female who presented with a 3-week history of cough, increased sputum production, SOB on exertion, and worsening fatigue. 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 Fib, CRF with hypoxia, gout and CAD status post PCI.  Clinical Impression  Pt admitted with above diagnosis. Pt currently with functional limitations due to the deficits listed below (see PT Problem List). Pt was able to ambulate a short distance but limited by poor endurance and desat with activity.  Will need home O2 based on O2 sats today.  Will follow acutely.  Pt will benefit from skilled PT to increase their independence and safety with mobility to allow discharge to the venue listed below.    SATURATION QUALIFICATIONS: (This note is used to comply with regulatory documentation for home oxygen)  Patient Saturations on Room Air at Rest = 94%  Patient Saturations on Room Air while Ambulating = 78%  Patient Saturations on 2 Liters of oxygen while Ambulating = 90%  Please briefly explain why patient needs home oxygen:Pt desat on RA with activity.  Needed 2LO2 to keep sats 90% and >.  Follow Up Recommendations Home health PT;Supervision/Assistance - 24 hour, refuses SNF even though recommended    Equipment Recommendations  Rolling walker with 5" wheels vs rollator (whichever pt would agree to) - (Pt told OT she only had cane but told this PT she had 2 walkers at home)    Recommendations for Other Services       Precautions / Restrictions Precautions Precautions: Fall Precaution Comments: SOB on exertion, but no significant drop in O2 or increase in HR Restrictions Weight Bearing Restrictions: No      Mobility  Bed Mobility Overal bed mobility: Needs Assistance Bed Mobility: Supine to Sit     Supine to  sit: Min guard;Supervision     General bed mobility comments: Incr time to come to eOB  Transfers Overall transfer level: Needs assistance Equipment used: Rolling walker (2 wheeled) Transfers: Sit to/from Stand Sit to Stand: Min guard         General transfer comment: increased time and effort  Ambulation/Gait Ambulation/Gait assistance: Min assist;Min guard Ambulation Distance (Feet): 30 Feet Assistive device: Rolling walker (2 wheeled) Gait Pattern/deviations: Step-through pattern;Decreased stride length;Trunk flexed;Wide base of support;Drifts right/left   Gait velocity interpretation: Below normal speed for age/gender General Gait Details: Pt initially ambulated well but as she walked she fatigued and she would try to rest on elbows on RW.  Needed cues to stand up and not do this.  Limited distance due to pts poor endurance. Also desat to 78-82 on RA with activity.  90-39% on 2LO2 with activity.    Stairs            Wheelchair Mobility    Modified Rankin (Stroke Patients Only)       Balance Overall balance assessment: Needs assistance Sitting-balance support: No upper extremity supported;Feet supported Sitting balance-Leahy Scale: Good     Standing balance support: Bilateral upper extremity supported Standing balance-Leahy Scale: Poor Standing balance comment: Reliant on RW and A from therapist                              Pertinent Vitals/Pain Pain Assessment: Faces Faces Pain Scale: Hurts little more Pain Location:  right side Pain Descriptors / Indicators: Aching;Grimacing;Guarding Pain Intervention(s): Limited activity within patient's tolerance;Monitored during session;Repositioned    Home Living Family/patient expects to be discharged to:: Private residence Living Arrangements: Other relatives Available Help at Discharge: Family (someone there most of the time) Type of Home: Apartment Home Access: Level entry     Home Layout: One  level Home Equipment: Cane - single point;Bedside commode;Walker - 2 wheels;Walker - 4 wheels      Prior Function Level of Independence: Needs assistance   Gait / Transfers Assistance Needed: "SPC or holding onto things"  ADL's / Homemaking Assistance Needed: I can bath, dress, and toilet myself--but it takes me a really long time        Hand Dominance   Dominant Hand: Right    Extremity/Trunk Assessment   Upper Extremity Assessment Upper Extremity Assessment: Defer to OT evaluation    Lower Extremity Assessment Lower Extremity Assessment: Generalized weakness    Cervical / Trunk Assessment Cervical / Trunk Assessment: Kyphotic  Communication   Communication: No difficulties  Cognition Arousal/Alertness: Awake/alert Behavior During Therapy: WFL for tasks assessed/performed Overall Cognitive Status: Within Functional Limits for tasks assessed                                        General Comments      Exercises     Assessment/Plan    PT Assessment Patient needs continued PT services  PT Problem List Decreased activity tolerance;Decreased balance;Decreased mobility;Decreased knowledge of use of DME;Decreased safety awareness;Decreased knowledge of precautions;Cardiopulmonary status limiting activity       PT Treatment Interventions DME instruction;Gait training;Functional mobility training;Therapeutic activities;Therapeutic exercise;Balance training;Patient/family education    PT Goals (Current goals can be found in the Care Plan section)  Acute Rehab PT Goals Patient Stated Goal: to go home PT Goal Formulation: With patient Time For Goal Achievement: 05/25/2017 Potential to Achieve Goals: Good    Frequency Min 3X/week   Barriers to discharge Decreased caregiver support      Co-evaluation               AM-PAC PT "6 Clicks" Daily Activity  Outcome Measure Difficulty turning over in bed (including adjusting bedclothes, sheets and  blankets)?: None Difficulty moving from lying on back to sitting on the side of the bed? : A Little Difficulty sitting down on and standing up from a chair with arms (e.g., wheelchair, bedside commode, etc,.)?: None Help needed moving to and from a bed to chair (including a wheelchair)?: A Little Help needed walking in hospital room?: A Little Help needed climbing 3-5 steps with a railing? : A Lot 6 Click Score: 19    End of Session Equipment Utilized During Treatment: Gait belt;Oxygen Activity Tolerance: Patient limited by fatigue Patient left: in bed;with call bell/phone within reach Nurse Communication: Mobility status PT Visit Diagnosis: Unsteadiness on feet (R26.81);Muscle weakness (generalized) (M62.81)    Time: 1610-9604 PT Time Calculation (min) (ACUTE ONLY): 15 min   Charges:   PT Evaluation $PT Eval Moderate Complexity: 1 Mod     PT G Codes:   PT G-Codes **NOT FOR INPATIENT CLASS** Functional Assessment Tool Used: AM-PAC 6 Clicks Basic Mobility Functional Limitation: Mobility: Walking and moving around Mobility: Walking and Moving Around Current Status (V4098): At least 20 percent but less than 40 percent impaired, limited or restricted Mobility: Walking and Moving Around Goal Status [phone]): At least 1  percent but less than 20 percent impaired, limited or restricted    [facility] Acute Rehabilitation [phone] (pager)   Berline Lopes 04/28/2017, 3:03 PM

## 2017-04-28 NOTE — Care Management Note (Signed)
Case Management Note  Patient Details  Name: [name] MRN: [id number] Date of Birth: 23-Jul-1932  Subjective/Objective:       CM following for progression and d/c planning.             Action/Plan: 04/28/2017 Met with pt to discuss OBS status, pt verbalized understanding. Pt states that she no longer receives [facility] services as her daughter has been trained to change her drainage bottle and is able to do this as well as assist with her other needs.   Expected Discharge Date:                  Expected Discharge Plan:  [place]  In-House Referral:  NA  Discharge planning Services  CM Consult  Post Acute Care Choice:  Home Health Choice offered to:  Patient  DME Arranged:    DME Agency:     HH Arranged:  RN [place] Agency:     Status of Service:  In process, will continue to follow  If discussed at Long Length of Stay Meetings, dates discussed:    Additional Comments:  Adron Bene, RN 04/28/2017, 3:30 PM

## 2017-04-28 NOTE — Evaluation (Signed)
Occupational Therapy Evaluation Patient Details Name: [name] MRN: [id number] DOB: [birth year] Today's Date: 04/28/2017    History of Present Illness Kaleiah Kutzer is an 83 year old female who presented with a 3-week history of cough, increased sputum production, SOB on exertion, and worsening fatigue. 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 Fib, CRF with hypoxia, gout and CAD status post PCI.   Clinical Impression   This 83 yo female admitted with above presents to acute OT with deficits below (thus affecting her ability to do more for herself and enjoy life outside her home). Her two main issues are increased WOB/DOE and generalized weakness both affecting her safety and independence with basic ADL. She will benefit from acute OT with follow up HHOT and 24 hour S/prn A.    Follow Up Recommendations  Home health OT;Supervision/Assistance - 24 hour;Other (comment) (feel she would really benefit from a SNF stay but pt not agreeable)    Equipment Recommendations  None recommended by OT       Precautions / Restrictions Precautions Precaution Comments: SOB on exertion, but no significant drop in O2 or increase in HR Restrictions Weight Bearing Restrictions: No      Mobility Bed Mobility               General bed mobility comments: pt sitting on EOB upon my arrival and left EOB  Transfers Overall transfer level: Needs assistance Equipment used: Rolling walker (2 wheeled) Transfers: Sit to/from Stand Sit to Stand: Min assist         General transfer comment: increased time and effort    Balance Overall balance assessment: Needs assistance Sitting-balance support: No upper extremity supported;Feet supported Sitting balance-Leahy Scale: Good     Standing balance support: Bilateral upper extremity supported Standing balance-Leahy Scale: Poor Standing balance comment: Reliant on RW and A from therapist with one instance of propping  on sink                           ADL either performed or assessed with clinical judgement   ADL Overall ADL's : Needs assistance/impaired Eating/Feeding: Independent;Sitting   Grooming: Set up;Sitting   Upper Body Bathing: Set up;Sitting   Lower Body Bathing: Minimal assistance;Sit to/from stand   Upper Body Dressing : Set up;Sitting   Lower Body Dressing: Sit to/from stand;Minimal assistance   Toilet Transfer: Minimal assistance;Ambulation;RW Toilet Transfer Details (indicate cue type and reason): bed>door>back to bed (had to rest once) Toileting- Clothing Manipulation and Hygiene: Minimal assistance;Sit to/from stand         General ADL Comments: increased time and effort for all due to DOE and increased WOB     Vision Patient Visual Report: No change from baseline              Pertinent Vitals/Pain Pain Assessment: No/denies pain     Hand Dominance Right   Extremity/Trunk Assessment Upper Extremity Assessment Upper Extremity Assessment: Generalized weakness   Lower Extremity Assessment Lower Extremity Assessment: Defer to PT evaluation       Communication Communication Communication: No difficulties   Cognition Arousal/Alertness: Awake/alert Behavior During Therapy: WFL for tasks assessed/performed Overall Cognitive Status: Within Functional Limits for tasks assessed  Home Living Family/patient expects to be discharged to:: Private residence Living Arrangements: Other relatives Available Help at Discharge: Family (someone there most of the time) Type of Home: Apartment Home Access: Level entry     Home Layout: One level     Bathroom Shower/Tub: Tub/shower unit (mainly sponge bathes)   Bathroom Toilet: Standard     Home Equipment: Cane - single point;Bedside commode          Prior Functioning/Environment Level of Independence: Needs assistance  Gait /  Transfers Assistance Needed: "SPC or holding onto things" ADL's / Homemaking Assistance Needed: I can bath, dress, and toilet myself--but it takes me a really long time            OT Problem List: Decreased strength;Decreased activity tolerance;Impaired balance (sitting and/or standing);Cardiopulmonary status limiting activity      OT Treatment/Interventions: Self-care/ADL training;Balance training;Energy conservation;DME and/or AE instruction;Patient/family education    OT Goals(Current goals can be found in the care plan section) Acute Rehab OT Goals Patient Stated Goal: to figure out why I get so short of breath but my oxygen stays up OT Goal Formulation: With patient Time For Goal Achievement: 05/06/2017 Potential to Achieve Goals: Good  OT Frequency: Min 2X/week              AM-PAC PT "6 Clicks" Daily Activity     Outcome Measure Help from another person eating meals?: None Help from another person taking care of personal grooming?: A Little Help from another person toileting, which includes using toliet, bedpan, or urinal?: A Little Help from another person bathing (including washing, rinsing, drying)?: A Little Help from another person to put on and taking off regular upper body clothing?: A Little Help from another person to put on and taking off regular lower body clothing?: A Little 6 Click Score: 19   End of Session Equipment Utilized During Treatment: Gait belt;Rolling walker Nurse Communication: Mobility status (pt may benefit from SLP consult for swallowing (pt reports things she has to chew she then cannot swallow) also would an appetite stimulant be appropriate)  Activity Tolerance: Patient limited by fatigue Patient left: with call bell/phone within reach (sitting EOB)  OT Visit Diagnosis: Unsteadiness on feet (R26.81);Muscle weakness (generalized) (M62.81)                Time: 1029-1050 OT Time Calculation (min): 21 min Charges:  OT General Charges $OT  Visit: 1 Visit OT Evaluation $OT Eval Moderate Complexity: 1 Mod G-Codes: OT G-codes **NOT FOR INPATIENT CLASS** Functional Limitation: Self care Self Care Current Status (Z6109): At least 1 percent but less than 20 percent impaired, limited or restricted Self Care Goal Status (U0454): At least 1 percent but less than 20 percent impaired, limited or restricted Self Care Discharge Status [phone]): At least 1 percent but less than 20 percent impaired, limited or restricted   Ignacia Palma, OTR/L [phone] 04/28/2017

## 2017-04-28 NOTE — Care Management Obs Status (Signed)
MEDICARE OBSERVATION STATUS NOTIFICATION   Patient Details  Name: [name] MRN: [id number] Date of Birth: 23-Jul-1932   Medicare Observation Status Notification Given:  Yes    Kharis Lapenna, Annamarie Major, RN 04/28/2017, 11:10 AM

## 2017-04-28 NOTE — Progress Notes (Signed)
SATURATION QUALIFICATIONS: (This note is used to comply with regulatory documentation for home oxygen)  Patient Saturations on Room Air at Rest = 94%  Patient Saturations on Room Air while Ambulating = 78%  Patient Saturations on 2 Liters of oxygen while Ambulating = 90%  Please briefly explain why patient needs home oxygen:Pt desat on RA with activity.  Needed 2LO2 to keep sats 90% and >.  Thanks.  [facility] Acute Rehabilitation [phone] (pager)

## 2017-04-28 NOTE — Progress Notes (Signed)
Pharmacy Antibiotic Note  [name] is a 83 y.o. female admitted on 2017-04-29 with weakness.  Patient has increased sputum production, SOB on exertion, and 3 week history of cough.  Pharmacy has been consulted for cefepime dosing for HCAP.  Noted that patient is s/p kidney transplant and is on chronic suppressive medications.  Abx stopped yesterday per primary before any given but now restarting today. Cx's sent. Afebrile, WBC wnl but immunosuppressed. CXR reports as pulmonary edema and stable pleural effusions, no acute cardiopulmonary disease.  Plan: Restart cefepime 1gm IV Q24H Monitor clinical picture, renal function F/U C&S, abx deescalation / LOT  Height:  (160 cm) Weight: 129 lb (58.5 kg) IBW/kg (Calculated) : 52.4  Temp (24hrs), Avg:98.2 F (36.8 C), Min:97.6 F (36.4 C), Max:98.6 F (37 C)   Recent Labs Lab 04-29-2017 1413 04/28/17 0243  WBC 7.6 6.5  CREATININE 1.57* 1.44*    Estimated Creatinine Clearance: 24.5 mL/min (A) (by C-G formula based on SCr of 1.44 mg/dL (H)).    Allergies  Allergen Reactions  . Sulfa Drugs Cross Reactors Swelling     Enzo Bi, PharmD, BCPS Clinical Pharmacist Pager [phone] 04/28/2017 11:23 AM

## 2017-04-28 NOTE — Progress Notes (Signed)
Date: 04/28/2017  Patient name: [name]  Medical record number: [id number]  Date of birth: 05/08/1932   I have seen and evaluated Unknown Foley and discussed their care with the Residency Team. Briefly, Ms. Dobransky is an 83yo woman with PMH of HFpEF (grade 2 diastolic), ESRD s/p renal transplant, paroxysmal Afib, chronic respiratory failure (currently not on O2), gout, CAD.  She presented with 3 weeks of cough, sputum production, SOB on exertion and worsening fatigue.  She also notes that she has had difficulty eating.  She notes that the food does not taste good.  She tries to make herself eat but she will just chew and chew and then she feels like food gets stuck.  She is able to take in fluids without issues.  Cough is productive of green sputum.  Main symptoms is feeling weak and being unable to make her coffee.  Further symptoms include chills for months, nausea without vomiting and weight loss.    Review of recent illness: 12/18/15 - diagnosed with pneumonia with pleural effusion. She was treated with 6 weeks of amoxicillin.  She had a persistent pleural effusion and dyspnea requiring oxygen and was recommended for VATS, however ended up getting a pleurex catheter to drain fluid which was placed in Dec of 2017.  Pleural effusion has been exudative, but without cells on cytology.  Repeat thoracentesis on 9/28 showed a transudative effusion.  Etiology of persistent effusion seems to be attributed to diastolic CHF and chronic renal failure.  Considered to be non malignant by treating providers.  As of March, unclear reason for persistent transudative effusion.  Per Pulmonology note, not thought to be an intra-pulmonary problem.  Further work up as needed and follow up PRN.  She did see Cardiology in July of this year, stopped her amiodarone to see if this would help with her fatigue.    She is supposed to be draining daily at this point, but is draining every other day.  Over the last few weeks, the  fluid changed from cloudy to bloody/serosanguinous.  (Though by chart review, this was noted initially in June of this year)   Soc Hx : She is a former smoker  Vitals:   04/28/17 0539 04/28/17 0825  BP: 116/73 122/65  Pulse: 60 (!) 55  Resp: (!) 22 20  Temp: 98.3 F (36.8 C) 98.3 F (36.8 C)  SpO2: 97% 97%   Physical Exam:  Gen: Lying in bed, appears fatigued, coughs with deep breathing.  Eyes: Anicteric sclerae, no conjunctival injection HENT: Distendend neck veins, + peripheral veins on chest, temporal wasting CV: Mildly bradycardic, normal rhythm, no murmur Pulm: Crackles at bases bilaterally, pleurex catheter in place in right axillary line under bandage.  No pain to palpation Abd: Soft, +BS Ext: She has no swelling of her arms.  She has 1+ edema to bilateral LE.  Skin tenting on hands Psych: Fatigued, normal mood  Pertinent data Cr 1.44  WBC 6.5  H/H 12/39  CXR as read by Dr. Lyman Bishop  IMPRESSION: 1. Stable chronic bilateral pleural effusions, right greater than left, and associated passive atelectasis in the lower lobes. No new/acute cardiopulmonary disease. 2. Stable moderate cardiomegaly without pulmonary edema. 3.  Aortic Atherosclerosis (ICD10-170.0)     Assessment and Plan: I have seen and evaluated the patient as outlined above. I agree with the formulated Assessment and Plan as detailed in the residents' note, with the following changes:   1. SOB/Cough, ? CAP - Sputum culture, blood  culture X 2 - Pleural effusion testing as noted below - Cefepime for broad coverage until we get more information  2. Fatigue, dysphagia - She has had decreased PO intake - Start Nepro Shakes between meals - Consider SLP consult - Appears dehydrated, hold lasix, encourage PO intake  3. Recurrent pleural effusion - Felt to be multifactorial due to hypoalbuminemia, Chronic renal failure and CHF.  Has been exudative initially when associated with a pneumonia, but since  has been transudative.  Family and patient report a recent change in fluid color - Sx are worsening X 3 weeks, could certainly be another pneumonia - see above - Will check cytology, fluid tests, culture for fungus and bacteria (hold for actinomyces), ADA - Reviewed imaging, will repeat CT chest with neck given distended veins, also MR abdomen for mass seen on kidney in previous imaging.  Given weight loss, and above presentation could be an undiagnosed malignancy vs. New infection  4. Distended neck veins - Unclear if this is new - based on review of last cardiology note she had no JVD in August - Will check [place] of arms/neck for clot - Last TTE in July of this year showed severe LVH with grade 2 DD, with normal EF.   Other issues per resident note.    Inez Catalina, MD 9/27/201811:27 AM

## 2017-04-28 NOTE — Progress Notes (Signed)
   Subjective: Patient was resting in her bed today upon entering the room. He stated that she slept moderately well overnight considering the length of time she spent in the clinic waiting for a bed. The patient denied worsening symptoms, however states that she still feels tired, continues to produce sputum with coughing, and shortness of breath. She improved slightly following the administration 2 L of oxygen via nasal cannula by her nurse secondary to her shortness of breath. The patient denied headache, chest pain, arm pain, or muscle aches. Patient attested to shortness of breath, cough, sputum production, chills, and generalized weakness.  Objective:  Vital signs in last 24 hours: Vitals:   04/19/2017 1946 04/22/2017 2339 04/28/17 0539 04/28/17 0825  BP: 130/62  116/73 122/65  Pulse: 67  60 (!) 55  Resp: 17  (!) 22 20  Temp: 98.6 F (37 C)  98.3 F (36.8 C) 98.3 F (36.8 C)  TempSrc:    Oral  SpO2: 96%  97% 97%  Weight: 131 lb 13.4 oz (59.8 kg) 129 lb (58.5 kg)    Height:  (1.6 m)      ROS negative except as per history of present illness Physical Exam Constitutional: the patient appears malnourished, and minimal distress Neck: Patient has significantly distended neck veins, and chest pains, more markedly noticeable on the right Cardiovascular; Regular rate and rhythm, no murmur auscultated Pulmonary: Increased effort, significant wheezing bilaterally with crackles in lower lung fields Abdomen: Soft, nontender, nondistended Musculoskeletal: Edema is much less noticeable in her feet and ankles, nontender Neurological: Patient is alert and oriented Skin is warm and dry to touch  Assessment/Plan:  Active Problems:   Pleural effusion, bilateral   CAP (community acquired pneumonia)   Fatigue   Shortness of breath   Cough with sputum   SOB (shortness of breath)  Shortness of breath, cough with sputum- probable CAP -Sputum culture ordered and pending -Cefepime per pharmacy  consult in place  -Pleural fluid sample sent for evaluation, SEE LABS sent -Patient on 2L O2 -continue to monitor vitals and assess appropriately   Fatigue/anorexia with possible dysphagia -Stable echo as of August 2018, not likely associated to CHF -Still holding lasix, place on renal diet  -will supplement with Nepro? Shakes -consider consult to dietician if patient does not improve -CT chest and neck wo contrast to evaluate neck veins and dysphagia  -MRI abdomin and pelvis  Chronic residual pleural effusion: Pleural catheter in place Drained today with labs sent, cytology, bacteria, fungal, ADA, LDH, TB  Serosanguinous discharge for 2-3 days as per patient -CT chest and neck wo contrast to evaluate neck veins and dysphagia  -MRI abdomin and pelvis Patient with distended neck veins today, checking CT, MRI, and US neck veins for clot vs subclavian syndrome   Diet: Renal Fluids: none Code: DNI Dvt PPX: Eliquis Dispo: Anticipated discharge in approximately 1-2 day(s).   Lanelle Bal, MD 04/28/2017, 10:56 AM Pager: Pager# [phone]

## 2017-04-28 NOTE — Progress Notes (Signed)
OT Cancellation Note  Patient Details Name: [name] MRN: [id number] DOB: 13-Jun-1932   Cancelled Treatment:    Reason Eval/Treat Not Completed: Other (comment). MD and team in with pt right now, will try back at later time as schedule allows.  Evette Georges [phone] 04/28/2017, 10:24 AM

## 2017-04-28 NOTE — Plan of Care (Signed)
Problem: Safety: Goal: Ability to remain free from injury will improve Outcome: Progressing Patient aware of need to call for assistance in order to get out of bed.

## 2017-04-29 ENCOUNTER — Encounter (HOSPITAL_COMMUNITY): Payer: Medicare Other

## 2017-04-29 ENCOUNTER — Other Ambulatory Visit: Payer: Self-pay

## 2017-04-29 LAB — PH, BODY FLUID: PH, BODY FLUID: 8.1

## 2017-04-29 LAB — BASIC METABOLIC PANEL
ANION GAP: 8 (ref 5–15)
BUN: 18 mg/dL (ref 6–20)
CALCIUM: 9.2 mg/dL (ref 8.9–10.3)
CHLORIDE: 99 mmol/L — AB (ref 101–111)
CO2: 25 mmol/L (ref 22–32)
Creatinine, Ser: 1.64 mg/dL — ABNORMAL HIGH (ref 0.44–1.00)
GFR calc Af Amer: 32 mL/min — ABNORMAL LOW (ref >60)
GFR calc non Af Amer: 28 mL/min — ABNORMAL LOW (ref >60)
GLUCOSE: 118 mg/dL — AB (ref 65–99)
Potassium: 3.4 mmol/L — ABNORMAL LOW (ref 3.5–5.1)
Sodium: 132 mmol/L — ABNORMAL LOW (ref 135–145)

## 2017-04-29 LAB — CBC
HCT: 37.8 % (ref 36.0–46.0)
HEMOGLOBIN: 12.5 g/dL (ref 12.0–15.0)
MCH: 30 pg (ref 26.0–34.0)
MCHC: 33.1 g/dL (ref 30.0–36.0)
MCV: 90.6 fL (ref 78.0–100.0)
Platelets: 193 10*3/uL (ref 150–400)
RBC: 4.17 MIL/uL (ref 3.87–5.11)
RDW: 14.4 % (ref 11.5–15.5)
WBC: 6.1 10*3/uL (ref 4.0–10.5)

## 2017-04-29 MED ORDER — LORAZEPAM 0.5 MG PO TABS
0.5000 mg | ORAL_TABLET | Freq: Once | ORAL | Status: AC
  Administered 2017-04-29: 0.5 mg via ORAL
  Filled 2017-04-29: qty 1

## 2017-04-29 NOTE — Progress Notes (Addendum)
   Subjective: the patient was lying in her bed into the room today. She states as long as she sits/lies at approximately a 30 angle she is able to do so without experiencing worsening shortness of breath. The patient denies improvement of her cough, weakness, or shortness of breath since admission. She continues to attempt increasing her by mouth intake, however, she has not been successful in conceiving anything greater than soup with crackers. She denied headache, dizziness, chest pain, abdominal pain, muscle aches, fever, or dysuria.  Objective:  Vital signs in last 24 hours: Vitals:   04/28/17 0539 04/28/17 0825 04/28/17 2128 04/29/17 0443  BP: 116/73 122/65 (!) 109/51 108/62  Pulse: 60 (!) 55 (!) 53 (!) 47  Resp: (!) Temp: 98.3 F (36.8 C) 98.3 F (36.8 C) 98.2 F (36.8 C) 97.7 F (36.5 C)  TempSrc:  Oral    SpO2: 97% 97% 99% 99%  Weight:      Height:       ROS negative except as per HPI.  Physical Exam  Nursing note and vitals reviewed. constitutional: Patient appears undernourished, but in no acute distress Cardiovascular: Regular rate and rhythm, intact distal pulses without murmur ascultated Pulmonary: Increased effort, wheezing in all lung fields with mild bibasilar crackles  Abdominal: Abdomen is soft, nontender, nondistended Musculoskeletal: She weighs are nonedematous, nontender today Psychiatric: Mood affect and normal  Assessment/Plan:  Active Problems:   Pleural effusion, bilateral   CAP (community acquired pneumonia)   Fatigue   Shortness of breath   Cough with sputum   SOB (shortness of breath)  Shortness of breath, cough with sputum- probable CAP -Sputum culture ordered and pending -Cefepime per pharmacy consult in place  -Pleural fluid sample sent for evaluation, fluid would appear exudative with LDH ratio 96/156=0.61, Protein ratio inconsistent however, overall the picture appears transudative despite LDH, will complete eval -Patient  on 2L O2 -continue to monitor vitals and assess appropriately  -CT chest revealed "faint reticulonodular opacification" possible infection vs malignancy, should have repeat scan in 6weeks, correlate clinically, infection most likely given presentation  Fatigue/anorexia with possible dysphagia -Stable echo as of August 2018, not likely associated with acute CHF exacerbation -Still holding lasix given lack of PO intake and soft blood pressure  -placed on renal diet  -MRI abdomin and pelvis pending  Chronic residual pleural effusion: Pleural catheter in place Drained today with labs sent, cytology, bacteria, fungal, ADA, LDH, TB  Serosanguinous discharge for 2-3 days as per patient -CT chest and neck wo contrast to evaluate neck veins and dysphagia  -MRI abdomin and pelvis Patient with distended neck veins today, checking CT, MRI, and US neck veins for clot vs subclavian syndrome [place] pending as is MRI  Diet: Renal Fluids: none Code: DNI Dvt PPX: Eliquis Dispo: Anticipated discharge in approximately 2-3 day(s).   Lanelle Bal, MD 04/29/2017, 6:44 AM Pager: Pager# [phone]

## 2017-04-29 NOTE — Evaluation (Signed)
Clinical/Bedside Swallow Evaluation Patient Details  Name: [name] MRN: [id number] Date of Birth: 02/25/1932  Today's Date: 04/29/2017 Time: SLP Start Time (ACUTE ONLY): 1138 SLP Stop Time (ACUTE ONLY): 1159 SLP Time Calculation (min) (ACUTE ONLY): 21 min  Past Medical History:  Past Medical History:  Diagnosis Date  . Candidal esophagitis (HCC)    a. 03/2014.  Marland Kitchen Cholelithiasis   . Chronic diastolic CHF (congestive heart failure) (HCC)    a. 06/2011 Echo: EF 60-65%, no rwma, Gr1 DD, mild TR. // b. Echo 12/19/15: Severe LVH, EF 55-60%, normal wall motion, mild MR, moderate LAE, moderate TR, PASP 44 mmHg  . CKD (chronic kidney disease), stage III    a. in setting of prior ESRD and cadaveric tx in 2003.  Marland Kitchen Coronary artery disease    a. 01/2001 Cath/PCI: LAD 50p, 54m, D1 50-60, RCA 51m (3.0x13 BX Velocity BMS).// b. NSTEMI 5/17 (demand ischemia) in setting of AF with RVR, pneumonia, CKD, a/c HF >> Myoview 12/26/15: prob inf infarct, no ischemia, EF 52%, Low Risk >> med Rx  . Dyspnea   . Gastritis    a. 03/2014  . Gout    unconfirmed by joint aspiration  . History of bacteremia    a. 08/2004 Group A Strep bacteremia.  . Hypertensive heart disease   . Pneumonia   . S/P kidney transplant    a. due to FSGN, follows with Dr. Vivia Birmingham in 2003. Was on HD prior to that.  . Type II diabetes mellitus (HCC)    Past Surgical History:  Past Surgical History:  Procedure Laterality Date  . ABDOMINAL HYSTERECTOMY    . CHEST TUBE INSERTION Right 07/20/2016   Procedure: INSERTION RIGHT PLEURAL DRAINAGE CATHETER;  Surgeon: Kerin Perna, MD;  Location: [facility] OR;  Service: Thoracic;  Laterality: Right;  . ESOPHAGOGASTRODUODENOSCOPY N/A 03/20/2014   Procedure: ESOPHAGOGASTRODUODENOSCOPY (EGD);  Surgeon: Theda Belfast, MD;  Location: [facility] ENDOSCOPY;  Service: Endoscopy;  Laterality: N/A;  . KIDNEY TRANSPLANT  2003  . VIDEO BRONCHOSCOPY Bilateral 05/12/2016   Procedure: VIDEO BRONCHOSCOPY  WITHOUT FLUORO;  Surgeon: Nyoka Cowden, MD;  Location: [facility] ENDOSCOPY;  Service: Cardiopulmonary;  Laterality: Bilateral;   HPI:  [name] is an 83 year old female who presented with a three-week history of cough, increased sputum production, shortness of breath on exertion, and worsening fatigue. 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 Fib, chronic respiratory failure with hypoxia, gout and coronary artery disease status post PCI. The patient states that she has had a cough for 3 weeks which is associated with the production ofwhite and sometimes green sputum. She has felt weak for the pastyear sinceshe was admitted to the hospital on four separate occasions for pneumonia. She attests to anorexia on most days, stating that she simply has no desire to eat. The patient stated that in the past she has typically waited until EMS was necessary for transport due to her severe weakness and in generally poor medical condition.    Assessment / Plan / Recommendation Clinical Impression  Clinical swallowing evaluation was completed using thin liquids, pureed material and dry solids in setting of admission for cough with sputum production, SHOB with exertion, fatigue and decreased appetite.  Most recent chest CT is showing small bilateral pleural effusions with a chest tube in place and subtle faint reticulonudular pattern opacification over mid and upper lungs bilaterally which may be infectious, atypical infection or inflammation.  Patient had 4 admissions about a year ago for PNA that she does not feel she has completely recovered from.  Only reported potential dysphagia symptom was early satiation with eating.  No other complaints.  Oral mechanism exam was completed and unremarkable.  All structures and function were adequate.  The patient's oral and pharyngeal swallow appeared to be functional.  Swallow trigger appeared  to be timely and hyo-laryngeal excursion was appreciated to palpation.  Mastication of dry solids was mildly slow and most likely related to lack of dentition.  Overt s/s of aspiration were not seen. In addition, the patient passed the [facility] Swallowing Protocol suggesting the low likelihood of aspiration.  Recommend continue with a regular diet with thin liquids.  Given respiratory issues ST to follow up for therapeutic diet tolerance.     SLP Visit Diagnosis: Dysphagia, oral phase (R13.11)    Aspiration Risk  Mild aspiration risk    Diet Recommendation   Regular with thin liquids  Medication Administration: Whole meds with liquid    Other  Recommendations Oral Care Recommendations: Oral care BID   Follow up Recommendations Other (comment) (TBD)      Frequency and Duration min 2x/week  2 weeks       Swallow Study   General Date of Onset: 04/20/2017 HPI: [name] is an 83 year old female who presented with a three-week history of cough, increased sputum production, shortness of breath on exertion, and worsening fatigue. 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 Fib, chronic respiratory failure with hypoxia, gout and coronary artery disease status post PCI. The patient states that she has had a cough for 3 weeks which is associated with the production ofwhite and sometimes green sputum. She has felt weak for the pastyear sinceshe was admitted to the hospital on four separate occasions for pneumonia. She attests to anorexia on most days, stating that she simply has no desire to eat. The patient stated that in the past she has typically waited until EMS was necessary for transport due to her severe weakness and in generally poor medical condition.  Type of Study: Bedside Swallow Evaluation Previous Swallow Assessment: None noted at [facility]. Diet Prior to this Study: Regular;Thin liquids Temperature Spikes  Noted: No History of Recent Intubation: No Behavior/Cognition: Alert;Cooperative;Pleasant mood Oral Cavity Assessment: Within Functional Limits Oral Care Completed by SLP: No Oral Cavity - Dentition: Dentures, top;Missing dentition Vision: Functional for self-feeding Self-Feeding Abilities: Able to feed self Patient Positioning: Upright in bed Baseline Vocal Quality: Normal Volitional Cough: Strong Volitional Swallow: Able to elicit    Oral/Motor/Sensory Function Overall Oral Motor/Sensory Function: Within functional limits   Ice Chips Ice chips: Not tested   Thin Liquid Thin Liquid: Within functional limits Presentation: Cup;Self Fed;Straw    Nectar Thick Nectar Thick Liquid: Not tested   Honey Thick Honey Thick Liquid: Not tested   Puree Puree: Within functional limits Presentation: Spoon   Solid   GO   Solid: Within functional limits Presentation: Self Fed        Dimas Aguas, MA, CCC-SLP Acute Rehab SLP [phone] [place] Contras 04/29/2017,12:08 PM

## 2017-04-29 NOTE — Progress Notes (Signed)
Patients BP 108/51 Pulse 54. MD notified. MD stated to hold Norvasc. Orders followed. Will continue to monitor.

## 2017-04-29 NOTE — Progress Notes (Addendum)
  Date: 04/29/2017  Patient name: [name]  Medical record number: [id number]  Date of birth: 1932-05-19   I have seen and evaluated this patient and I have discussed the plan of care with the house staff. Please see Dr. Nelma Rothman note for complete details. I concur with his findings with the following additions/corrections:   Pleural fluid GS with GPC in clusters.  If patient has fever or decompensates, would add MRSA coverage.  On the next time fluid is drained, consider performing AFB if not done to look for NMTB (MAC)  Sid Falcon, MD 04/29/2017, 1:48 PM

## 2017-04-29 NOTE — Progress Notes (Signed)
Was informed patient's pleural fluid came back positive for gram positive cocci clusters. MD notified.

## 2017-04-30 ENCOUNTER — Inpatient Hospital Stay (HOSPITAL_COMMUNITY): Payer: Medicare Other

## 2017-04-30 MED ORDER — RAMELTEON 8 MG PO TABS
8.0000 mg | ORAL_TABLET | Freq: Once | ORAL | Status: AC
  Administered 2017-04-30: 8 mg via ORAL
  Filled 2017-04-30: qty 1

## 2017-04-30 MED ORDER — POLYETHYLENE GLYCOL 3350 17 G PO PACK
17.0000 g | PACK | Freq: Every day | ORAL | Status: DC
  Administered 2017-04-30 – 2017-05-05 (×4): 17 g via ORAL
  Filled 2017-04-30 (×8): qty 1

## 2017-04-30 NOTE — Progress Notes (Signed)
   Subjective:  Patient was feeling the same. Stating that if she is lying in bed she is fine, just getting out of bed makes her tired. She become short of breath just with walking to the bathroom. She denied any chest pain or shortness of breath at rest. Her appetite remained the same.she is having constipation for the last few days, denies any abdominal pain.  Objective:  Vital signs in last 24 hours: Vitals:   04/29/17 1700 04/29/17 2216 04/30/17 0517 04/30/17 1216  BP: (!) 127/58 (!) 119/56 121/68 (!) 114/58  Pulse: (!) 56 (!) 55 65 64  Resp: Temp: 97.7 F (36.5 C) 98.2 F (36.8 C) (!) 97.4 F (36.3 C)   TempSrc: Oral Oral Oral   SpO2: 98% 97% 100%   Weight:      Height:       Gen. Chronically ill-appearing elderly lady, lying comfortably in her bed, in no acute distress. Chest. By mouth bilateral basal crackles, more pronounced on right mid and lower zone. CV. Regular rate and rhythm. Abdomen. Soft, non tender, bowel sounds positive. Extremities. No edema, no cyanosis, pulses intact and symmetrical.  Assessment/Plan:  Shortness of breath, cough with sputum- probable CAP. Pleural cultures were showing some gram-positive cocci but there was no growth after 24 hour period. There is no change in patient. -Check pleural fluid for AFB today. -Continue cefepime- we can broaden up with vancomycin if she decompensate.  Fatigue/anorexia with possible dysphagia.still appears dry, had her abdominal and pelvic MRI done today-read pending. Swallow evaluation negative for any acute pathology. Vascular ultrasound for neck is still pending. -continue holding Lasix.  Chronic residual pleural effusion: Pleural catheter in place.Another drain was ordered today to send fluid for AFB.  Dispo: Anticipated discharge in approximately 2-3 day(s).   Arnetha Courser, MD 04/30/2017, 1:00 PM Pager: [phone]

## 2017-04-30 NOTE — Progress Notes (Signed)
Patient states that she would like to wait until later on this afternoon to empty pleura vac. Will continue to monitor.

## 2017-04-30 NOTE — Progress Notes (Signed)
B/P is 114/58 and HR is 64. MD notified. MD stated to hold amlodipine. Orders followed. Will continue to monitor.

## 2017-04-30 NOTE — Progress Notes (Signed)
  Date: 04/30/2017  Patient name: [name]  Medical record number: [id number]  Date of birth: Apr 27, 1932   This patient's plan of care was discussed with the house staff. Please see their note for complete details. I concur with their findings. Ms Hovanec was off the floor during AM rounds. Dr Nelson Chimes and I discussed findings to date and plan of care.   Burns Spain, MD 04/30/2017, 2:19 PM

## 2017-04-30 NOTE — Progress Notes (Signed)
Patient states that she has not had a BM in a week and is requesting Milk of Magnesia. MD notified and aware. Will continue to monitor.

## 2017-05-01 ENCOUNTER — Inpatient Hospital Stay (HOSPITAL_COMMUNITY): Payer: Medicare Other

## 2017-05-01 DIAGNOSIS — E46 Unspecified protein-calorie malnutrition: Secondary | ICD-10-CM | POA: Diagnosis present

## 2017-05-01 DIAGNOSIS — Z9225 Personal history of immunosupression therapy: Secondary | ICD-10-CM

## 2017-05-01 DIAGNOSIS — R630 Anorexia: Secondary | ICD-10-CM | POA: Diagnosis present

## 2017-05-01 DIAGNOSIS — R0602 Shortness of breath: Secondary | ICD-10-CM

## 2017-05-01 LAB — CBC
HEMATOCRIT: 38.6 % (ref 36.0–46.0)
HEMOGLOBIN: 12.7 g/dL (ref 12.0–15.0)
MCH: 29.5 pg (ref 26.0–34.0)
MCHC: 32.9 g/dL (ref 30.0–36.0)
MCV: 89.6 fL (ref 78.0–100.0)
Platelets: 223 10*3/uL (ref 150–400)
RBC: 4.31 MIL/uL (ref 3.87–5.11)
RDW: 13.5 % (ref 11.5–15.5)
WBC: 6.6 10*3/uL (ref 4.0–10.5)

## 2017-05-01 LAB — BASIC METABOLIC PANEL
ANION GAP: 8 (ref 5–15)
BUN: 17 mg/dL (ref 6–20)
CHLORIDE: 99 mmol/L — AB (ref 101–111)
CO2: 23 mmol/L (ref 22–32)
Calcium: 9.4 mg/dL (ref 8.9–10.3)
Creatinine, Ser: 1.54 mg/dL — ABNORMAL HIGH (ref 0.44–1.00)
GFR calc Af Amer: 35 mL/min — ABNORMAL LOW (ref >60)
GFR, EST NON AFRICAN AMERICAN: 30 mL/min — AB (ref >60)
Glucose, Bld: 116 mg/dL — ABNORMAL HIGH (ref 65–99)
POTASSIUM: 3.8 mmol/L (ref 3.5–5.1)
Sodium: 130 mmol/L — ABNORMAL LOW (ref 135–145)

## 2017-05-01 LAB — ACID FAST SMEAR (AFB, MYCOBACTERIA): Acid Fast Smear: NEGATIVE

## 2017-05-01 LAB — ACID FAST SMEAR (AFB)

## 2017-05-01 LAB — TSH: TSH: 1.01 u[IU]/mL (ref 0.350–4.500)

## 2017-05-01 MED ORDER — RAMELTEON 8 MG PO TABS
8.0000 mg | ORAL_TABLET | Freq: Once | ORAL | Status: AC
  Administered 2017-05-01: 8 mg via ORAL
  Filled 2017-05-01: qty 1

## 2017-05-01 NOTE — Progress Notes (Signed)
   Subjective: The patient was lying asleep in her bed today upon entering the room. She stated that she felt somewhat improved over the prior day, however, she remains weak and continues to cough. Patient denied acute complaints overnight stating that the ramelteon had improved her sleep. Patient inquired as to what may improve her symptoms, she is informed that increasing her dietary intake of protein, fats and even carbohydrates will most likely significantly improve her symptoms. Patient agreed to increase her intake today.  Objective:  Vital signs in last 24 hours: Vitals:   04/30/17 1216 04/30/17 1846 04/30/17 2211 05/01/17 0521  BP: (!) 114/58 133/67 (!) 122/97 (!) 120/56  Pulse: 64 67 70 68  Resp:  Temp:  98.1 F (36.7 C) 97.9 F (36.6 C) 97.6 F (36.4 C)  TempSrc:  Oral Oral Oral  SpO2:  100% 100% 100%  Weight:      Height:       ROS negative except as per HPI.  Physical Exam  Nursing note and vitals reviewed. constitutional: Patient is under nourished, in no acute distress Neck: Significant external jugular vein distention is noted on the right, appears unchanged from prior feeling Cardiovascular: regular rate, nd rhythm with no murmur auscultated Pulmonary: Normal effort, mild expiratory wheezes in the upper lung fields bilaterally Abdomen soft, nontender, nondistended, with normal bowel sounds Musculoskeletal: Nonedematous & nontender extremities  Neurological: Patient is alert and oriented  Assessment/Plan:  Active Problems:   Pleural effusion, bilateral   CAP (community acquired pneumonia)   Fatigue   Shortness of breath   Cough with sputum   SOB (shortness of breath)  Shortness of breath, cough with sputum- probable CAP. Pleural cultures were showing some gram-positive cocci but there was no growth after 24 hour period. There is a mild improvement in her symptoms today. -Checking pleural fluid for AFB -Continue cefepime- we can broaden up with  vancomycin if she decompensates.  Fatigue/anorexia with possible dysphagia. Abdominal and pelvic MRI completed, negative for acute pathology except possible hemorrhagic renal cyst Swallow evaluation negative for any acute pathology. Vascular ultrasound for neck is still pending??? -continue holding Lasix due to concern for hypovolemia.   Chronic residual pleural effusion: Pleural catheter in place. Another drain was ordered today to send fluid for AFB.  Diet: Renal Fluids: none Code: DNI Dvt PPX: Eliquis Dispo: Anticipated discharge in approximately 1-2 day(s).   Lanelle Bal, MD 05/01/2017, 6:57 AM Pager: Pager# [phone]

## 2017-05-01 NOTE — Progress Notes (Signed)
Vascular team called stated results from patient's upper extremity venous doppler performed. Results showed bilateral age and determent DVT to bilateral jugular veins. Will continue to monitor.

## 2017-05-01 NOTE — Progress Notes (Signed)
Pharmacy Antibiotic Note  [name] is a 83 y.o. female admitted on 04/20/2017 with weakness.  Patient has increased sputum production, SOB on exertion, and 3 week history of cough.  Pharmacy has been consulted for cefepime dosing for HCAP.  Noted that patient is s/p kidney transplant and is on chronic suppressive medications.  CXR reports as pulmonary edema and stable pleural effusions, no acute cardiopulmonary disease. Pleural effusion culture grew out staph epi, although some concern from team this may be contaminant as she has had catheter in place.  Plan: Cefepime 1g IV Q24H Monitor clinical picture, c/s, de-escalation/LOT, and renal function   Height:  (160 cm) Weight:  (pt refused) IBW/kg (Calculated) : 52.4  Temp (24hrs), Avg:97.8 F (36.6 C), Min:97.6 F (36.4 C), Max:98.1 F (36.7 C)   Recent Labs Lab 04/15/2017 1413 04/28/17 0243 04/29/17 0235 05/01/17 0225  WBC 7.6 6.5 6.1 6.6  CREATININE 1.57* 1.44* 1.64* 1.54*    Estimated Creatinine Clearance: 22.9 mL/min (A) (by C-G formula based on SCr of 1.54 mg/dL (H)).    Allergies  Allergen Reactions  . Sulfa Drugs Cross Reactors Swelling   Cefepime 9/27>>  9/26 BCx: ngtd 9/27 pleural fluid- fungus: sent 9/27 pleural fluid: staph epidermidis, R to cipro, oxacillin, Bactrim (team thinks contaminant as she has had catheter in place) 9/27 MRSA PCR: pos 9/29 AFB pleural effusion: sent  Christohper Dube D. Melven Stockard, PharmD, BCPS Clinical Pharmacist Pager: [phone] Clinical Phone for 05/01/2017 until 3:30pm: [phone] If after 3:30pm, please call main pharmacy at [phone] 05/01/2017 12:28 PM

## 2017-05-01 NOTE — Discharge Summary (Signed)
  Name: [name] MRN: [id number] DOB: [birth year] 83 y.o.  Date of Admission: 05-27-17  7:36 PM Date of Discharge: 2017-06-19 Attending Physician: Tyson Alias, *  Discharge Diagnosis: Principal Problem:   Acute hypoxemic respiratory failure [facility]) Active Problems:   History of renal transplant   Pleural effusion, bilateral, recurrent   CAP (community acquired pneumonia)   Fatigue   Shortness of breath   Cough with sputum   Protein-calorie malnutrition (HCC)   Personal history of immunosupression therapy   Anorexia   Staphylococcus epidermidis infection   CKD (chronic kidney disease), symptom management only, stage 4 (severe) (HCC)  Cause of death: Acute hypoxemic respiratory failure secondary to chronic, bilateral pleural effusions  Time of death: 3:58pm  Disposition and follow-up:   [name] was discharged from [facility] in expired condition.    Hospital Course:  [name] was an 83 yo with a PMH of DM, CKD s/p renal transplant in 2003 on chronic immunosuppression, HFpEF, paroxysmal atrial fibrillation, and CAD who presented to the hospital on 05/27/17 with a productive cough, worsening shortness of breath, and progressive malaise that had been slowly developing since a previous hospitalization in 2017 for pneumonia. She was admitted to the internal medicine teaching service with pulmonology, nephrology, cardiothoracic surgery, and infectious disease consulting. Prior to hospitalization, the patient had a chronic indwelling Pleurex catheter in place for management of chronic, bilateral pleural effusions (R>L). These effusions were transudative in nature and thought to be secondary to HFpEF and CKD stage 4. In the early part of her hospitalization it was determined that her indwelling catheter had become colonized with staphylococcus epidermidis. This infection was thought to be the cause of her progressive malaise, chronic productive cough, and  worsening SOB. Removal was recommended by infectious disease and this procedure was completed by cardiothoracic surgery on 05/30/2017. Antibiotic therapy was initiated after the procedure. To provide symptomatic relief of recurrent bilateral pleural effusions during antibiotic treatment, the patient had multiple ultrasound guided thoracentesis on the right and left side. Over the course of the hospitalization, the patient's functional status declined significantly. Her symptoms prevented her from participating with physical and occupational therapy. Echocardiography showed worsening of her baseline congestive heart failure, which was thought to be why her effusions began to recur at a faster rate as the hospitalization progressed and why such large volumes were able to be drained during thoracenteses.   On 05/17/2017 the patient developed acute respiratory distress with hypoxia secondary to worsening of a moderate-large right pleural effusion. The patient underwent CT guided placement of a temporary indwelling pleural catheter with interventional radiology and >2L of fluid was drained from the right pleural cavity. In spite of placement of a temporary indwelling catheter, the patient's respiratory status continued to decline. She developed hypoxic respiratory failure that required BiPAP. During the hospitalization the patient was clear that she was to remain DNR/DNI. She remained on BiPAP for 2 days. While on BiPAP, the patient developed oliguric renal failure. Her family, who had visited with the patient nightly during the course of the hospitalization, decided to initiate full comfort care measures after the patient developed multiorgan system failure. The patient was made full comfort care in the morning of 2017/06/19 and died at 3:58 pm on that same day.    SignedRozann Lesches, MD Jun 19, 2017, 4:58 PM

## 2017-05-02 DIAGNOSIS — R0600 Dyspnea, unspecified: Secondary | ICD-10-CM

## 2017-05-02 DIAGNOSIS — R0902 Hypoxemia: Secondary | ICD-10-CM

## 2017-05-02 DIAGNOSIS — E1122 Type 2 diabetes mellitus with diabetic chronic kidney disease: Secondary | ICD-10-CM

## 2017-05-02 DIAGNOSIS — J90 Pleural effusion, not elsewhere classified: Secondary | ICD-10-CM

## 2017-05-02 DIAGNOSIS — J869 Pyothorax without fistula: Secondary | ICD-10-CM

## 2017-05-02 DIAGNOSIS — J189 Pneumonia, unspecified organism: Secondary | ICD-10-CM

## 2017-05-02 LAB — GRAM STAIN

## 2017-05-02 LAB — BASIC METABOLIC PANEL
Anion gap: 7 (ref 5–15)
BUN: 17 mg/dL (ref 6–20)
CALCIUM: 9.5 mg/dL (ref 8.9–10.3)
CO2: 25 mmol/L (ref 22–32)
CREATININE: 1.6 mg/dL — AB (ref 0.44–1.00)
Chloride: 99 mmol/L — ABNORMAL LOW (ref 101–111)
GFR calc Af Amer: 33 mL/min — ABNORMAL LOW (ref >60)
GFR, EST NON AFRICAN AMERICAN: 29 mL/min — AB (ref >60)
GLUCOSE: 104 mg/dL — AB (ref 65–99)
Potassium: 3.9 mmol/L (ref 3.5–5.1)
Sodium: 131 mmol/L — ABNORMAL LOW (ref 135–145)

## 2017-05-02 LAB — CULTURE, BLOOD (ROUTINE X 2)
Culture: NO GROWTH
Culture: NO GROWTH
Special Requests: ADEQUATE

## 2017-05-02 LAB — CBC
HCT: 37.3 % (ref 36.0–46.0)
HEMOGLOBIN: 12.1 g/dL (ref 12.0–15.0)
MCH: 28.9 pg (ref 26.0–34.0)
MCHC: 32.4 g/dL (ref 30.0–36.0)
MCV: 89 fL (ref 78.0–100.0)
PLATELETS: 239 10*3/uL (ref 150–400)
RBC: 4.19 MIL/uL (ref 3.87–5.11)
RDW: 13.4 % (ref 11.5–15.5)
WBC: 5.2 10*3/uL (ref 4.0–10.5)

## 2017-05-02 LAB — PROCALCITONIN

## 2017-05-02 MED ORDER — BOOST / RESOURCE BREEZE PO LIQD
1.0000 | Freq: Three times a day (TID) | ORAL | Status: DC
  Administered 2017-05-02 – 2017-05-07 (×7): 1 via ORAL
  Administered 2017-05-07: 237 mL via ORAL
  Administered 2017-05-08 – 2017-05-17 (×17): 1 via ORAL

## 2017-05-02 MED ORDER — VANCOMYCIN HCL 500 MG IV SOLR
500.0000 mg | INTRAVENOUS | Status: DC
  Administered 2017-05-02 – 2017-05-05 (×4): 500 mg via INTRAVENOUS
  Filled 2017-05-02 (×6): qty 500

## 2017-05-02 MED ORDER — RAMELTEON 8 MG PO TABS
8.0000 mg | ORAL_TABLET | Freq: Every evening | ORAL | Status: DC | PRN
  Administered 2017-05-02 – 2017-05-16 (×14): 8 mg via ORAL
  Filled 2017-05-02 (×20): qty 1

## 2017-05-02 NOTE — Progress Notes (Signed)
Pt. wanting sleep med, and Dr. Delma Officer paged.

## 2017-05-02 NOTE — Progress Notes (Signed)
  Date: 05/02/2017  Patient name: [name]  Medical record number: [id number]  Date of birth: [birth year]   I have seen and evaluated this patient and I have discussed the plan of care with the house staff. Please see their note for complete details. I concur with their findings.  Appreciate pulmonology input, narrowed antibiotics to vancomycin. Her biggest concern remains her extreme fatigue with poor exercise tolerance and poor appetite with associated malnutrition. Unclear etiology for her worsening frailty. MAC remains a consideration, pleural fluid negative for AFB but will try again to obtain sputum.  Oda Kilts, MD 05/02/2017, 5:35 PM

## 2017-05-02 NOTE — Plan of Care (Signed)
Problem: Education: Goal: Knowledge of [facility] Education information/materials will improve Outcome: Progressing POC reviewed with pt.   

## 2017-05-02 NOTE — Progress Notes (Signed)
Pharmacy Antibiotic Note  [name] is a 83 y.o. female with a chronic transudative non-malignant effusion admitted on 04/13/2017 with weakness.  Patient has increased sputum production, SOB on exertion, and 3 week history of cough.  Noted that patient is s/p kidney transplant and is on chronic suppressive medications.   CT chest 9/17 > small bilateral pleural effusions R > L with associated bibasilar atelectasis, subtle reticulonodular pattern of opacification of mid to upper lungs bilaterally.  Pharmacy initially consulted on 9/27 for cefepime dosing for HCAP.  Today 05/02/17,  Cefepime has been discontinued by MD and pharmacy consulted to start IV Vancomycin for pneumonia per Pulm/CCM's recmommendation. Pleural effusion culture grew out staph epidermis, although some concern from team this may be contaminant as she has had catheter in place.  Staph epidermis is sensitive to Vancomycin, Resistant to oxicillin, cipro, and bactrim. MD obtaining repeat pleural fluid cultures. SCr 1.6, CrCl ~ 22 ml/min  Plan: Cefepime discontinued  Start Vancomycin 500 mg IV q24h   Monitor clinical picture, c/s, de-escalation/LOT,  renal function and steady state vancomycin trough prn.    Height:  (160 cm) Weight:  (Bed scale does not work.) IBW/kg (Calculated) : 52.4  Temp (24hrs), Avg:98 F (36.7 C), Min:97.9 F (36.6 C), Max:98.1 F (36.7 C)   Recent Labs Lab 04/13/2017 1413 04/28/17 0243 04/29/17 0235 05/01/17 0225 05/02/17 0228  WBC 7.6 6.5 6.1 6.6 5.2  CREATININE 1.57* 1.44* 1.64* 1.54* 1.60*    Estimated Creatinine Clearance: 22 mL/min (A) (by C-G formula based on SCr of 1.6 mg/dL (H)).    Allergies  Allergen Reactions  . Sulfa Drugs Cross Reactors Swelling   Cefepime 9/27>>10/1 Vancomcyin 10/1>>   9/26 BCx: negative  9/27 pleural fluid- fungus: sent 9/27 pleural fluid: staph epidermidis, R to cipro, oxacillin, Bactrim (team thinks contaminant as she has had catheter in  place) 9/27 MRSA PCR: pos 9/29 AFB pleural effusion: sent  Noah Delaine, RPh Clinical Pharmacist Pager: [phone] Clinical Phone for 05/02/2017 until 3:30pm: [phone] If after 3:30pm [phone] or call main pharmacy at [phone] 05/02/2017 12:47 PM

## 2017-05-02 NOTE — Care Management Note (Signed)
Case Management Note  Patient Details  Name: SEPTEMBER MORMILE MRN: [id number] Date of Birth: 05-07-1932  Subjective/Objective:    CM following for progression and d/c planning.                 Action/Plan: 05/02/2017 Pt from home with family, states that her daughter was trained by the [facility] to empty the pleurex. Family is able to assist as needed.   Expected Discharge Date:                  Expected Discharge Plan:  Home w Home Health Services  In-House Referral:  NA  Discharge planning Services  CM Consult  Post Acute Care Choice:  Home Health Choice offered to:  Patient  DME Arranged:    DME Agency:     HH Arranged:  RN HH Agency:     Status of Service:  In process, will continue to follow  If discussed at Long Length of Stay Meetings, dates discussed:    Additional Comments:  Starlyn Skeans, RN 05/02/2017, 3:17 PM

## 2017-05-02 NOTE — Progress Notes (Signed)
Occupational Therapy Treatment Patient Details Name: [name] MRN: [id number] DOB: [birth year] Today's Date: 05/02/2017    History of present illness Venesa Semidey is an 83 year old female who presented with a 3-week history of cough, increased sputum production, SOB on exertion, and worsening fatigue. 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 Fib, CRF with hypoxia, gout and CAD status post PCI.   OT comments  Pt overall reports not feeling well this morning and c/o not sleeping throughout the night. Pt required encouragement for participation this session. Pt performed supine >sit on EOB with increased time and steady assistance. Pt on 2 L of O2 throughout session remaining above 98%. Pt standing from bed with use of bed rails with steady assistance but declined transfers and ambulation at this time. Pt standing for 2 minutes and sitting down with O2 remaining at 98% and HR at 87 bpm. Pt performed grooming tasks while seated on EOB with supervision and set up A. Pt set up for breakfast tray on EOB with RN present in room. Pt continues to benefit from OT intervention to address current functional deficits.    Follow Up Recommendations  Home health OT;Supervision/Assistance - 24 hour;Other (comment) (pt continues to decline SNF )    Equipment Recommendations  None recommended by OT    Recommendations for Other Services      Precautions / Restrictions Precautions Precautions: Fall Restrictions Weight Bearing Restrictions: No       Mobility Bed Mobility Overal bed mobility: Needs Assistance Bed Mobility: Supine to Sit     Supine to sit: Supervision;Min guard     General bed mobility comments: increased time to come to sit on EOB  Transfers Overall transfer level: Needs assistance Equipment used: None Transfers: Sit to/from Stand Sit to Stand: Min guard              Balance Overall balance assessment: Needs  assistance Sitting-balance support: No upper extremity supported;Feet supported Sitting balance-Leahy Scale: Good     Standing balance support: Bilateral upper extremity supported Standing balance-Leahy Scale: Fair Standing balance comment: assistance from therapist             ADL either performed or assessed with clinical judgement   ADL       Grooming: Set up;Sitting       General ADL Comments: Pt washing face with set up A while seated on EOB     Vision Patient Visual Report: No change from baseline            Cognition Arousal/Alertness: Awake/alert Behavior During Therapy: WFL for tasks assessed/performed Overall Cognitive Status: Within Functional Limits for tasks assessed                           Pertinent Vitals/ Pain       Pain Assessment: Faces Faces Pain Scale: Hurts little more Pain Location:  (generalized) Pain Descriptors / Indicators: Discomfort Pain Intervention(s): Monitored during session;Repositioned         Frequency  Min 2X/week        Progress Toward Goals  OT Goals(current goals can now be found in the care plan section)  Progress towards OT goals: Progressing toward goals  Acute Rehab OT Goals Patient Stated Goal: to go home OT Goal Formulation: With patient Time For Goal Achievement: 05/16/17 Potential to Achieve Goals: Good  Plan Discharge plan remains appropriate    Co-evaluation  AM-PAC PT "6 Clicks" Daily Activity     Outcome Measure   Help from another person eating meals?: None Help from another person taking care of personal grooming?: A Little Help from another person toileting, which includes using toliet, bedpan, or urinal?: A Little Help from another person bathing (including washing, rinsing, drying)?: A Little Help from another person to put on and taking off regular upper body clothing?: A Little Help from another person to put on and taking off regular lower body  clothing?: A Little 6 Click Score: 19    End of Session Equipment Utilized During Treatment: Oxygen  OT Visit Diagnosis: Unsteadiness on feet (R26.81);Muscle weakness (generalized) (M62.81)   Activity Tolerance Patient limited by fatigue   Patient Left with call bell/phone within reach;Other (comment) (RN present in room)   Nurse Communication          Time: [id number] OT Time Calculation (min): 18 min  Charges: OT General Charges $OT Visit: 1 Visit OT Treatments $Therapeutic Activity: 8-22 mins     Genie Mirabal P, MS, OTR/L, CBIS 05/02/2017, 8:39 AM

## 2017-05-02 NOTE — Consult Note (Signed)
Name: [name] MRN: [id number] DOB: 1932/04/20    ADMISSION DATE:  04/30/2017 CONSULTATION DATE:  05/02/17  REFERRING MD :  Criselda Peaches  CHIEF COMPLAINT:  SOB   HISTORY OF PRESENT ILLNESS:  [name] is a 83 y.o. female with a PMH as outlined below including but not limited to ESRD secondary to FSGN s/p renal transplant, CHF recurrent non-malignant transudative pleural effusion s/p multiple thoracentesis and ultimately had right pleur-x catheter placed 07/21/16 by Dr. Maren Beach.  She drains this roughly every other day at home (ranges from 200c to 800cc) and has also been seen by Dr. Sherene Sires as outpatient, last seen 10/27/16 and instructed only needs PRN follow up.  She was admitted 9/26 with generalized weakness.  She was found to have CAP and was started on cefepime given immunocompromised state.  CT chest demonstrated small bilateral pleural effusions R > L with associated bibasilar atelectasis, subtle reticulonodular pattern of opacification of mid to upper lungs bilaterally. Pleural studies were obtained, cultures growing S.epidermidis (sensitive to vanc).  Barely meets exudative criteria by LDH.  PCCM was asked to see in consultation AM 05/02/17.   PAST MEDICAL HISTORY :   has a past medical history of Candidal esophagitis (HCC); Cholelithiasis; Chronic diastolic CHF (congestive heart failure) (HCC); CKD (chronic kidney disease), stage III; Coronary artery disease; Dyspnea; Gastritis; Gout; History of bacteremia; Hypertensive heart disease; Pneumonia; S/P kidney transplant; and Type II diabetes mellitus (HCC).  has a past surgical history that includes Kidney transplant (2003); Abdominal hysterectomy; Esophagogastroduodenoscopy (N/A, 03/20/2014); Video bronchoscopy (Bilateral, 05/12/2016); and Chest tube insertion (Right, 07/20/2016). Prior to Admission medications   Medication Sig Start Date End Date Taking? Authorizing Provider  amLODipine (NORVASC) 10 MG tablet Take 5 mg by mouth daily.  05/21/16  Yes [provider]  apixaban (ELIQUIS) 2.5 MG TABS tablet Take 1 tablet (2.5 mg total) by mouth 2 (two) times daily. Cancel previous Eliquis prescription 07/23/16  Yes Doree Fudge M, PA-C  aspirin 81 MG chewable tablet Chew 81 mg by mouth daily.   Yes [provider]  famotidine (PEPCID) 20 MG tablet Take 20 mg by mouth 2 (two) times daily.   Yes [provider]  furosemide (LASIX) 80 MG tablet Take 80 mg by mouth 2 (two) times daily.    Yes [provider]  metoprolol tartrate (LOPRESSOR) 50 MG tablet Take 50 mg by mouth 2 (two) times daily.  12/03/16  Yes [provider]  mycophenolate (CELLCEPT) 250 MG capsule Take 500 mg by mouth 2 (two) times daily.   Yes [provider]  PROGRAF 1 MG capsule Take 2 mg by mouth 2 (two) times daily. 01/31/17  Yes [provider]   Allergies  Allergen Reactions  . Sulfa Drugs Cross Reactors Swelling    FAMILY HISTORY:  family history includes CAD in her brother, father, and unknown relative. SOCIAL HISTORY:  reports that she quit smoking about 48 years ago. She has never used smokeless tobacco. She reports that she does not drink alcohol or use drugs.  REVIEW OF SYSTEMS:   All negative; except for those that are bolded, which indicate positives.  Constitutional: weight loss, weight gain, night sweats, fevers, chills, fatigue, weakness.  HEENT: headaches, sore throat, sneezing, nasal congestion, post nasal drip, difficulty swallowing, tooth/dental problems, visual complaints, visual changes, ear aches. Neuro: difficulty with speech, weakness, numbness, ataxia. CV:  chest pain, orthopnea, PND, swelling in lower extremities, dizziness, palpitations, syncope.  Resp: cough, hemoptysis, dyspnea, wheezing. GI: heartburn, indigestion,  abdominal pain, nausea, vomiting, diarrhea, constipation, change in bowel habits, loss of appetite, hematemesis, melena, hematochezia.  GU: dysuria,  change in color of urine, urgency or frequency, flank pain, hematuria. MSK: joint pain or swelling, decreased range of motion. Psych: change in mood or affect, depression, anxiety, suicidal ideations, homicidal ideations. Skin: rash, itching, bruising.    SUBJECTIVE:  Feels tired.  Currently denies SOB, dyspnea.  VITAL SIGNS: Temp:  [97.9 F (36.6 C)-98.1 F (36.7 C)] 97.9 F (36.6 C) (10/01 0700) Pulse Rate:  [69-82] 79 (10/01 0700) Resp:  [8-18] 16 (10/01 0700) BP: (110-140)/(55-78) 140/69 (10/01 0700) SpO2:  [99 %-100 %] 100 % (10/01 0700)  PHYSICAL EXAMINATION: General: Elderly female, in NAD. Neuro: A&O x 3, no deficits. HEENT: [name] / AT. MMM. Cardiovascular: RRR, no M/R/G. Lungs: CTAB. Normal resp effort. Right pleur-x catheter in place. Abdomen: BS x 4, S/NT/ND. Musculoskeletal: No deformities, no edema. Skin: Warm, dry.   Recent Labs Lab 04/29/17 0235 05/01/17 0225 05/02/17 0228  NA 132* 130* 131*  K 3.4* 3.8 3.9  CL 99* 99* 99*  CO2 BUN CREATININE 1.64* 1.54* 1.60*  GLUCOSE 118* 116* 104*    Recent Labs Lab 04/29/17 0235 05/01/17 0225 05/02/17 0228  HGB 12.5 12.7 12.1  HCT 37.8 38.6 37.3  WBC 6.1 6.6 5.2  PLT 193 223 239   No results found.  STUDIES:  CT chest 9/17 > small bilateral pleural effusions R > L with associated bibasilar atelectasis, subtle reticulonodular pattern of opacification of mid to upper lungs bilaterally.  SIGNIFICANT EVENTS  9/27 > admit. 10/1 > PCCM consult.  ASSESSMENT / PLAN:  Recurrent pleural effusions - s/p Pleur-X catheter placement 07/21/16.  Had cultures 9/27 that have grown S.epidermidis.  Unclear whether this is true indication of infection vs contaminant given chronic pleur-x catheter.  Plan: D/c cefepime, switch to vanc. Obtain repeat pleural fluid cultures. Assess PCT (though note, result may be confounded given her immunocompromised state). Bronchial hygiene. Mobilize as able. CXR  intermittently.  Rest per primary team.   Rutherford Guys, PA - C [facility] Pager: [phone]  or [phone] 05/02/2017, 1:01 PM  Attending Note:  83 year old female with a chronic transudative non-malignant effusion who presents with pneumonia and pleural effusion.  PCCM consulted for abx choice and recommendation about pleurex drainage.  On exam, distant BS diffusely.  I reviewed chest CT myself, no infiltrate noted.  Some pleural effusion.  Issue here is that the patient is growing staph epi from the pleural fluid that appears barely exudative but WBC has a large number of PMNs.  Discussed with PCCM-NP.  Empyema: fluid analysis inconsistent (pH of 8.1 and normal glucose)             - Recheck pleural effusion cultures             - Procalcitonin             - If procal I negative then will likely d/c abx             - Hold of PICC placement for now  PNA:             - Continue abx - change to vanc alone             - D/C Cefepime, staph epi usually not sensitive  Pleural effusion:             -  Drain pleurex as per home schedule.  PCCM will f/u in AM.  Patient seen and examined, agree with above note.  I dictated the care and orders written for this patient under my direction.  Alyson Reedy, MD [phone]

## 2017-05-02 NOTE — Care Management Important Message (Signed)
Important Message  Patient Details  Name: DEERICA WASZAK MRN: [id number] Date of Birth: [birth year]   Medicare Important Message Given:  Yes    Advit Trethewey, Annamarie Major, RN 05/02/2017, 1:34 PM

## 2017-05-02 NOTE — Progress Notes (Signed)
   Subjective:  Patient was seen laying in bed this morning with [place] in place in no acute distress. The patient again endorsed nonspecific fatigue and malaise that prevents her from walking around her room and completing activities of daily living at home. She states that she continues to have a productive cough and shortness of breath which has not significantly improved throughout this hospitalization.   Objective:  Vital signs in last 24 hours: Vitals:   05/01/17 1819 05/01/17 2230 05/02/17 0529 05/02/17 0700  BP: (!) 110/55 120/78 134/65 140/69  Pulse: 80 82 69 79  Resp: 18 (!) Temp: 98.1 F (36.7 C) 97.9 F (36.6 C) 98.1 F (36.7 C) 97.9 F (36.6 C)  TempSrc: Oral Oral Oral Oral  SpO2: 100% 99% 100% 100%  Weight:      Height:       Physical Exam  Constitutional:  Thin, elderly woman laying in bed in no acute distress  Cardiovascular: Normal rate and regular rhythm.  Exam reveals no friction rub.   No murmur heard. Pulmonary/Chest: Effort normal.  Patient had [place] in place. No nasal flaring or accessory muscle use. Patient has intermittent crackles and wheezing throughout both lung fields bilaterally.   Musculoskeletal: She exhibits no edema (of R rib cage or bilateral lower extremities) or tenderness (of R rib cage or bilateral lower extremities).  Patient has clean, dry dressing intact on R rib cage  Skin: Skin is warm and dry. Capillary refill takes less than 2 seconds. No rash noted. No erythema. No pallor.   Assessment/Plan:  Active Problems:   History of renal transplant   Pleural effusion, bilateral   CAP (community acquired pneumonia)   Fatigue   Shortness of breath   Cough with sputum   Protein-calorie malnutrition (HCC)   Personal history of immunosupression therapy   Anorexia  [name] is an 83 yo with PMH of T2DM, CKD s/p kidney transplant in 2003 on immunosuppression, diastolic CHF, and CAD who presented with increased productive cough,  worsening SOB, and worsening general malaise that caused inability to complete ADLs on 04/28/2017. The patient was admitted to the internal medicine teaching service for management. The specific problems addressed during this admission are as follows:  SOB, productive cough, and probable CAP: Patient's SOB and productive cough not showing significant clinical improvement in spite of treatment with cefepime since admission. Patient continues to endorse overall malaise and we are concerned that she has a pneumonia that we are not covering for with current antibiotics, including possible atypical pneumonia. Plan to consult pulmonology regarding antibiotic therapy, duration of treatment, and possible pleural drain management.  -Pulmonology consulted, appreciate recommendations -Pleural fluid shows, no malignant cells observed -AFB pleural fluid negative, culture pending -Change cefepime to vancomycin today, given that previous cultures grew staph epi -Repeat pleural effusion cultures today 10/1; obtain sputum cultures 10/1 -Incentive spirometry while in bed  Fatigue/Anorexia: Malignancy work up negative thus far. Still awaiting vascular ultrasound results and recommendations. Will encourage patient to continue eating and work with PT/OT as they come around.  -Follow up vascular ultrasound from 9/30 -Nutrition consult tomorrow -Continue participation with PT/OT  FEN:  -Regular diet -Replace electrolytes as needed  DVT prophylaxis: Eliquis 2.5 mg BID  Dispo: Anticipated discharge pending clinical improvement.   [name], MD 05/02/2017, 1:53 PM Pager: [phone]

## 2017-05-02 NOTE — Progress Notes (Signed)
  Speech Language Pathology Treatment: Dysphagia  Patient Details Name: [name] MRN: [id number] DOB: Jan 16, 1932 Today's Date: 05/02/2017 Time: 1848-5927 SLP Time Calculation (min) (ACUTE ONLY): 20 min  Assessment / Plan / Recommendation Clinical Impression  Skilled treatment session focused on dysphagia goals. SLP provided skilled observation of pt consuming regular diet textures. Pt consumed minimal bites of regular textures without overt s/s of oropharyngeal dysphagia. SLP offered supplemental items such as Ensure but pt declined. Pt consumed minimal bites of fruit but stated she "ate too much yesterday and was still full." In response to offers of Ensure, pt commented that she "ate too much of that (Ensure) last time she was in the hospital and it made her sick, she didn't want any this time." ST to sign off as pt doesn't demonstrate any overt s/s of oropharyngeal dysphagia at this time.     HPI HPI: [name] is an 83 year old female who presented with a three-week history of cough, increased sputum production, shortness of breath on exertion, and worsening fatigue. 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 Fib, chronic respiratory failure with hypoxia, gout and coronary artery disease status post PCI. The patient states that she has had a cough for 3 weeks which is associated with the production ofwhite and sometimes green sputum. She has felt weak for the pastyear sinceshe was admitted to the hospital on four separate occasions for pneumonia. She attests to anorexia on most days, stating that she simply has no desire to eat. The patient stated that in the past she has typically waited until EMS was necessary for transport due to her severe weakness and in generally poor medical condition.       SLP Plan  All goals met       Recommendations  Diet recommendations: Regular;Thin liquid Liquids  provided via: Cup;Straw Medication Administration: Whole meds with liquid Supervision: Patient able to self feed Compensations: Minimize environmental distractions Postural Changes and/or Swallow Maneuvers: Seated upright 90 degrees                Oral Care Recommendations: Oral care BID Follow up Recommendations: None SLP Visit Diagnosis: Dysphagia, oral phase (R13.11) Plan: All goals met       GO                [name] 05/02/2017, 9:08 AM

## 2017-05-02 NOTE — Progress Notes (Signed)
Physical Therapy Treatment Patient Details Name: [name] MRN: [id number] DOB: January 02, 1932 Today's Date: 05/02/2017    History of Present Illness [name] is an 83 year old female who presented with a 3-week history of cough, increased sputum production, SOB on exertion, and worsening fatigue. 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 Fib, CRF with hypoxia, gout and CAD status post PCI.    PT Comments    Continuing work on functional mobility and activity tolerance;  Ms. Totman is motivated to get home, walked in-room on Room Air, and O2 sats remained at acceptable levels; she reports shortness of breath, and noted DOE, and supplemental O2 did give her some relief   Follow Up Recommendations  Home health PT;Supervision/Assistance - 24 hour     Equipment Recommendations  Rolling walker with 5" wheels (Consider rollator)    Recommendations for Other Services       Precautions / Restrictions Precautions Precautions: Fall Precaution Comments: SOB on exertion, but no significant drop in O2 or increase in HR    Mobility  Bed Mobility Overal bed mobility: Needs Assistance Bed Mobility: Supine to Sit     Supine to sit: Supervision     General bed mobility comments: Used rails; initial assist with heavy bedcovers; able to sit down to bed and move LEs to other side to get up on other side of bed; slow moving, but without physical assist  Transfers Overall transfer level: Needs assistance Equipment used: Rolling walker (2 wheeled) Transfers: Sit to/from Stand Sit to Stand: Min guard         General transfer comment: increased time and effort  Ambulation/Gait Ambulation/Gait assistance: Min guard Ambulation Distance (Feet): 20 Feet Assistive device: Rolling walker (2 wheeled) Gait Pattern/deviations: Step-through pattern;Decreased stride length;Trunk flexed;Wide base of support;Drifts right/left     General Gait Details: Better  endurance, with less need to prop elbows on RW; cues to self-monitor for activity tolerance   Stairs            Wheelchair Mobility    Modified Rankin (Stroke Patients Only)       Balance     Sitting balance-Leahy Scale: Good       Standing balance-Leahy Scale: Fair                              Cognition Arousal/Alertness: Awake/alert Behavior During Therapy: WFL for tasks assessed/performed Overall Cognitive Status: Within Functional Limits for tasks assessed                                        Exercises      General Comments        Pertinent Vitals/Pain Pain Assessment: No/denies pain Faces Pain Scale: Hurts a little bit Pain Location: right side Pain Descriptors / Indicators: Discomfort Pain Intervention(s): Monitored during session    Home Living                      Prior Function            PT Goals (current goals can now be found in the care plan section) Acute Rehab PT Goals Patient Stated Goal: to go home PT Goal Formulation: With patient Time For Goal Achievement: 05/26/2017 Potential to Achieve Goals: Good Progress towards PT goals: Progressing toward goals  Frequency    Min 3X/week      PT Plan Current plan remains appropriate    Co-evaluation              AM-PAC PT "6 Clicks" Daily Activity  Outcome Measure  Difficulty turning over in bed (including adjusting bedclothes, sheets and blankets)?: None Difficulty moving from lying on back to sitting on the side of the bed? : None Difficulty sitting down on and standing up from a chair with arms (e.g., wheelchair, bedside commode, etc,.)?: None Help needed moving to and from a bed to chair (including a wheelchair)?: None Help needed walking in hospital room?: A Little Help needed climbing 3-5 steps with a railing? : A Little 6 Click Score: 22    End of Session Equipment Utilized During Treatment: Oxygen Activity Tolerance:  Patient tolerated treatment well Patient left: in chair;with call bell/phone within reach Nurse Communication: Mobility status PT Visit Diagnosis: Unsteadiness on feet (R26.81);Muscle weakness (generalized) (M62.81)     Time: 1610-9604 PT Time Calculation (min) (ACUTE ONLY): 24 min  Charges:  $Gait Training: 8-22 mins $Therapeutic Activity: 8-22 mins                    G Codes:       Van Clines, PT  Acute Rehabilitation Services Pager [phone] Office [phone]    [name] 05/02/2017, 4:20 PM

## 2017-05-02 NOTE — Plan of Care (Signed)
Problem: Physical Regulation: Goal: Ability to maintain clinical measurements within normal limits will improve Outcome: Progressing Will continue to encourage nutrition. Patient aware of need to receive antibiotics for pneumonia.

## 2017-05-02 NOTE — Progress Notes (Signed)
Initial Nutrition Assessment  DOCUMENTATION CODES:   Severe malnutrition in context of chronic illness  INTERVENTION:  - Boost Breeze po TID, each supplement provides 250 kcal and 9 grams of protein - Recommend obtaining new weight   NUTRITION DIAGNOSIS:   Malnutrition (severe) related to chronic illness (CHF) as evidenced by moderate depletion of body fat, severe depletion of muscle mass, energy intake < or equal to 75% for > or equal to 1 month  GOAL:   Patient will meet greater than or equal to 90% of their needs  MONITOR:   PO intake, Supplement acceptance, Labs, I & O's  REASON FOR ASSESSMENT:   Consult Assessment of nutrition requirement/status  ASSESSMENT:   Pt with PMH of CAD, CHF, ESRD prior to renal transplant (2003), Type II DM,  presents with 3 week cough with sputum production, SOB, and fatigue found pleural effusion.    Spoke to pt at bedside. Pt reports a decreased appetite over the past 3 months. Pt reports difficulty with fatigue while chewing meats. Pt reports not finishing her full meals and only rarely consuming meats. Pt reports not consuming 100% of meals despite chart review recordings, reporting "I ate a little of this and a little of that".   Pt reports consuming Ensure for extended time period PTA, however vomited after consuming one and is weary to try again. Pt amenable to trying Boost Breeze supplement while admitted.   Pt reports weight loss. Pt reports a UBW of 160 "before I got sick" Pt with half consumed jello and applesauce at bedside during visit. Pt shows decline in weight over the past year however no weight changes in chart are significant for time frame.   Nutrition focused physical exam completed. Findings include mild/moderate fat depletion, mild/moderate to severe muscle depletion, and mild edema in lower extremities.  Labs reviewed; Na 131  Medications reviewed; Pepcid, Miralax  Diet Order:  Diet Heart Room service appropriate? Yes;  Fluid consistency: Thin  Skin:  Reviewed, no issues  Last BM:  05/01/17  Height:   Ht Readings from Last 1 Encounters:  05-24-2017  (1.6 m)    Weight:   Wt Readings from Last 1 Encounters:  2017-05-24 131 lb 14.4 oz (59.8 kg)    Ideal Body Weight:  52.3 kg  BMI:  Body mass index is 22.85 kg/m.  Estimated Nutritional Needs:   Kcal:  1400-1600  Protein:  75-90 grams  Fluid:  >/= 1.4 L/d  EDUCATION NEEDS:   Education needs no appropriate at this time  Fransisca Kaufmann, MS, RDN, LDN 05/02/2017 12:12 PM

## 2017-05-02 DEATH — deceased

## 2017-05-03 DIAGNOSIS — I5030 Unspecified diastolic (congestive) heart failure: Secondary | ICD-10-CM

## 2017-05-03 DIAGNOSIS — E871 Hypo-osmolality and hyponatremia: Secondary | ICD-10-CM

## 2017-05-03 DIAGNOSIS — Z86718 Personal history of other venous thrombosis and embolism: Secondary | ICD-10-CM

## 2017-05-03 DIAGNOSIS — Z79899 Other long term (current) drug therapy: Secondary | ICD-10-CM

## 2017-05-03 DIAGNOSIS — N189 Chronic kidney disease, unspecified: Secondary | ICD-10-CM

## 2017-05-03 LAB — CBC
HEMATOCRIT: 39.5 % (ref 36.0–46.0)
HEMOGLOBIN: 13.2 g/dL (ref 12.0–15.0)
MCH: 29.7 pg (ref 26.0–34.0)
MCHC: 33.4 g/dL (ref 30.0–36.0)
MCV: 88.8 fL (ref 78.0–100.0)
Platelets: 245 10*3/uL (ref 150–400)
RBC: 4.45 MIL/uL (ref 3.87–5.11)
RDW: 13.2 % (ref 11.5–15.5)
WBC: 5.1 10*3/uL (ref 4.0–10.5)

## 2017-05-03 LAB — EXPECTORATED SPUTUM ASSESSMENT W GRAM STAIN, RFLX TO RESP C

## 2017-05-03 LAB — BASIC METABOLIC PANEL
ANION GAP: 8 (ref 5–15)
BUN: 18 mg/dL (ref 6–20)
CALCIUM: 9.7 mg/dL (ref 8.9–10.3)
CO2: 22 mmol/L (ref 22–32)
Chloride: 99 mmol/L — ABNORMAL LOW (ref 101–111)
Creatinine, Ser: 1.45 mg/dL — ABNORMAL HIGH (ref 0.44–1.00)
GFR, EST AFRICAN AMERICAN: 37 mL/min — AB (ref >60)
GFR, EST NON AFRICAN AMERICAN: 32 mL/min — AB (ref >60)
GLUCOSE: 108 mg/dL — AB (ref 65–99)
POTASSIUM: 3.9 mmol/L (ref 3.5–5.1)
Sodium: 129 mmol/L — ABNORMAL LOW (ref 135–145)

## 2017-05-03 NOTE — Progress Notes (Signed)
Physical Therapy Treatment Patient Details Name: [name] MRN: [id number] DOB: 24-Aug-1931 Today's Date: 05/03/2017    History of Present Illness [name] is an 83 year old female who presented with a 3-week history of cough, increased sputum production, SOB on exertion, and worsening fatigue. 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 Fib, CRF with hypoxia, gout and CAD status post PCI.    PT Comments    Pt slow to progress due to fatigue. Ambulated 20' with rollator and  Min-guard A. Worked on increasing standing tolerance because pt mentions that she has been unable to stand in her kitchen to make a cup of coffee or a sandwich due to extreme fatigue. PT will continue to follow.   Follow Up Recommendations  Home health PT;Supervision/Assistance - 24 hour     Equipment Recommendations  None recommended by PT (has rollator)    Recommendations for Other Services       Precautions / Restrictions Precautions Precautions: Fall Restrictions Weight Bearing Restrictions: No    Mobility  Bed Mobility Overal bed mobility: Modified Independent Bed Mobility: Supine to Sit     Supine to sit: Modified independent (Device/Increase time)     General bed mobility comments: pt slow moving but gets to EOB independently as well as returning to supine  Transfers Overall transfer level: Needs assistance Equipment used: Rolling walker (2 wheeled) Transfers: Sit to/from Stand Sit to Stand: Min guard         General transfer comment: increased time and effort  Ambulation/Gait Ambulation/Gait assistance: Min guard Ambulation Distance (Feet): 20 Feet Assistive device: 4-wheeled walker Gait Pattern/deviations: Step-through pattern;Decreased stride length;Trunk flexed;Wide base of support;Drifts right/left Gait velocity: decreased Gait velocity interpretation: <1.8 ft/sec, indicative of risk for recurrent falls General Gait Details: used  rollator so pt could go further distance but pt deferred this, however, she did like ease of pushing rollator. Before and after walking, pt leans forward with elbows on RW to breathe better   Stairs            Wheelchair Mobility    Modified Rankin (Stroke Patients Only)       Balance Overall balance assessment: Needs assistance Sitting-balance support: No upper extremity supported;Feet supported Sitting balance-Leahy Scale: Good     Standing balance support: Bilateral upper extremity supported Standing balance-Leahy Scale: Fair Standing balance comment: pt able to stand for short bouts without support but cannot maintain due to fatigue which compromises balance               High Level Balance Comments: worked on standing balance tolerance x5 mins, pt able to converse while standing but unable to perform standing ther ex            Cognition Arousal/Alertness: Awake/alert Behavior During Therapy: WFL for tasks assessed/performed Overall Cognitive Status: Within Functional Limits for tasks assessed                                        Exercises      General Comments General comments (skin integrity, edema, etc.): VSS depite 3/4 DOE, remained on 2L O2      Pertinent Vitals/Pain Pain Assessment: No/denies pain    Home Living                      Prior Function  PT Goals (current goals can now be found in the care plan section) Acute Rehab PT Goals Patient Stated Goal: to go home PT Goal Formulation: With patient Time For Goal Achievement: 05/25/2017 Potential to Achieve Goals: Good Progress towards PT goals: Not progressing toward goals - comment (fatigue)    Frequency    Min 3X/week      PT Plan Current plan remains appropriate    Co-evaluation              AM-PAC PT "6 Clicks" Daily Activity  Outcome Measure  Difficulty turning over in bed (including adjusting bedclothes, sheets and  blankets)?: None Difficulty moving from lying on back to sitting on the side of the bed? : None Difficulty sitting down on and standing up from a chair with arms (e.g., wheelchair, bedside commode, etc,.)?: None Help needed moving to and from a bed to chair (including a wheelchair)?: None Help needed walking in hospital room?: A Little Help needed climbing 3-5 steps with a railing? : A Lot 6 Click Score: 21    End of Session Equipment Utilized During Treatment: Gait belt;Oxygen Activity Tolerance: Patient limited by fatigue Patient left: in bed;with call bell/phone within reach Nurse Communication: Mobility status PT Visit Diagnosis: Unsteadiness on feet (R26.81);Muscle weakness (generalized) (M62.81)     Time: 1610-9604 PT Time Calculation (min) (ACUTE ONLY): 20 min  Charges:  $Gait Training: 8-22 mins                    G Codes:       Lyanne Co, PT  Acute Rehab Services  [phone]    [place] L Barbar Brede 05/03/2017, 3:04 PM

## 2017-05-03 NOTE — Consult Note (Signed)
Name: [name] MRN: [id number] DOB: 1931-09-01    ADMISSION DATE:  04/25/2017 CONSULTATION DATE:  05/02/17  REFERRING MD :  Criselda Peaches  CHIEF COMPLAINT:  SOB   HISTORY OF PRESENT ILLNESS:  [name] is a 83 y.o. female with a PMH as outlined below including but not limited to ESRD secondary to FSGN s/p renal transplant, CHF recurrent non-malignant transudative pleural effusion s/p multiple thoracentesis and ultimately had right pleur-x catheter placed 07/21/16 by Dr. Maren Beach.  She drains this roughly every other day at home (ranges from 200c to 800cc) and has also been seen by Dr. Sherene Sires as outpatient, last seen 10/27/16 and instructed only needs PRN follow up.  She was admitted 9/26 with generalized weakness.  She was found to have CAP and was started on cefepime given immunocompromised state.  CT chest demonstrated small bilateral pleural effusions R > L with associated bibasilar atelectasis, subtle reticulonodular pattern of opacification of mid to upper lungs bilaterally. Pleural studies were obtained, cultures growing S.epidermidis (sensitive to vanc).  Barely meets exudative criteria by LDH.  PCCM was asked to see in consultation AM 05/02/17.   SUBJECTIVE:  Pt reports she always feels "so tired".  Denies fevers, chills.  States she gets SOB with exertion.    VITAL SIGNS: Temp:  [97.8 F (36.6 C)-98.5 F (36.9 C)] 98.5 F (36.9 C) (10/02 1000) Pulse Rate:  [65-79] 65 (10/02 1000) Resp:  [16-18] 18 (10/02 1000) BP: (124-137)/(62-68) 124/62 (10/02 1000) SpO2:  [100 %] 100 % (10/02 1000)  PHYSICAL EXAMINATION: General: elderly female in NAD, lying in bed HEENT: MM pink/moist, no jvd PSY: calm/appropriate Neuro: AAOx4, speech clear, Jonne CV: s1s2 rrr, no m/r/g PULM: even/non-labored, diminished right base, clear on left  WU:JWJX, non-tender, bsx4 active  Extremities: warm/dry, no edema  Skin: no rashes or lesions    Recent Labs Lab 05/01/17 0225 05/02/17 0228  05/03/17 0242  NA 130* 131* 129*  K 3.8 3.9 3.9  CL 99* 99* 99*  CO2 BUN CREATININE 1.54* 1.60* 1.45*  GLUCOSE 116* 104* 108*    Recent Labs Lab 05/01/17 0225 05/02/17 0228 05/03/17 0242  HGB 12.7 12.1 13.2  HCT 38.6 37.3 39.5  WBC 6.6 5.2 5.1  PLT 223 239 245   No results found.  STUDIES:  ECHO 02/23/17 >> LVEF 55-60%, grade 2 diastolic dysfunction  CT chest 9/17 > small bilateral pleural effusions R > L with associated bibasilar atelectasis, subtle reticulonodular pattern of opacification of mid to upper lungs bilaterally.  SIGNIFICANT EVENTS  09/27  Admit. 10/01  PCCM consult  ASSESSMENT / PLAN:  Recurrent pleural effusions - transudative (pH of 8.1 / normal glucose), s/p Pleur-X catheter placement 07/21/16.  Had cultures 9/27 that have grown S.epidermidis.  Unclear whether this is true indication of infection vs contaminant given chronic pleur-x catheter.   Plan: Follow repeat pleural cultures, anticipate staph epi is contaminant Continue vanco until second set of cultures reviewed.  If staph epi would consider contaminant & discontinue abx unless new fevers / WBC develop (as she is afebrile with normal WBC, negative PCT).        Follow intermittent CXR Trend PCT Pulmonary hygiene- IS, mobilize  Drain PleurX catheter per home schedule Follow up with Dr. Sherene Sires post discharge   Rest per primary team.  PCCM will be available PRN.  Please call back if new needs arise.   Canary Brim, NP-C [facility] [name]: [phone]  or if no answer [phone] 05/03/2017, 11:06 AM  Attending Note:  83 year old female with chronic transudative nonmalignant effusion with pneumonia and pleural effuswion.  PCCM consulted for abx choices and recommendations about pleurex drainage.  On exam, BS with crackles at the bases and quiter chest than the day before.  I reviewed CXR myself, no acute changes noted, pleural effusion noted.  I do not believe the  staph epidermidis is a pathogen.  Discussed with PCCM-NP.  Empyema: I do not believe this pleural fluid is truly infected.    - F/U on cultures.  - Procalcitonin <0.10.  - D/C abx  PNA:  - D/C abx  Pleural effusion:   - Drain pleurex per home schedule  PCCM will sign off, please call back if needed.  F/U with Dr. Sherene Sires upon discharge.  Patient seen and examined, agree with above note.  I dictated the care and orders written for this patient under my direction.  Alyson Reedy, MD [phone]

## 2017-05-03 NOTE — Progress Notes (Signed)
Internal Medicine Clinic Attending  I saw and evaluated the patient.  I personally confirmed the key portions of the history and exam documented by Dr. Molt and I reviewed pertinent patient test results.  The assessment, diagnosis, and plan were formulated together and I agree with the documentation in the resident's note. 

## 2017-05-03 NOTE — Progress Notes (Signed)
Subjective:  Patient was seen sitting up in bed this morning in no acute distress, however the patient states that she was "having a spell" and that she didn't feel well. She denied nausea, vomiting, and worsening of her chronic cough but rather states she just doesn't feel right. She does not feel much improved from yesterday and has many questions regarding her plan of care that were adequately answered at bedside.  Objective:  Vital signs in last 24 hours: Vitals:   05/02/17 0529 05/02/17 0700 05/02/17 1806 05/03/17 0610  BP: 134/65 140/69 137/65 126/68  Pulse: 69 79 78 79  Resp: _0 Temp: 98.1 F (36.7 C) 97.9 F (36.6 C) 97.8 F (36.6 C) 98.5 F (36.9 C)  TempSrc: Oral Oral Oral Oral  SpO2: 100% 100% 100% 100%  Weight:      Height:       Physical Exam  Constitutional:  Thin, elderly appearing woman laying in bed in no acute distress  Cardiovascular: Normal rate, regular rhythm and intact distal pulses.  Exam reveals no friction rub.   No murmur heard. Pulmonary/Chest: Effort normal. No respiratory distress. She has no wheezes. She has no rales.  Clean dry bandage intact over R rib cage  Abdominal: Soft. She exhibits no distension. There is no tenderness. There is no guarding.  Musculoskeletal: She exhibits no edema (of bilateral lower extremities) or tenderness (of bilateral lower extremities).  Skin: Skin is warm and dry. No rash noted. No erythema.   Assessment/Plan:  Active Problems:   History of renal transplant   Pleural effusion, bilateral   CAP (community acquired pneumonia)   Fatigue   Shortness of breath   Cough with sputum   Protein-calorie malnutrition (HCC)   Personal history of immunosupression therapy   Anorexia  [name] is an 83 yo with PMH of T2DM, CKD s/p kidney transplant in 2003 on immunosuppression, diastolic CHF, and CAD who presented on 04/09/2017 with increased productive cough, worsening SOB, and worsening general malaise that  caused inability to complete ADLs. The patient was admitted to the internal medicine teaching service for management. The specific problems addressed during this admission are as follows:  SOB, productive cough, and probable CAP: Patient continues to endorse productive cough and general malaise. Pleural fluid has remained serosanguinous during this admission and previous studies suggest it doesn't necessarily meet criteria for exudative fluid based of LDH/protein, but there are significant WBC's and patient's culture grew staph epidermidis. Per pulmonology recommendations, antibiotics changed to vancomycin on 10/1 as cefepime may not have adequately covered this organism and may explain patient's prolonged symptoms. Pleural fluid re-cultured to ensure staph epidermidis not contaminant on 10/1 and gram stain this AM shows gram positive cocci in pairs. Sputum gram stain from 10/1 showed few gram positive cocci, rods and gram negative rods as well. Still concerned about MAC so will send AFB for sputum stain. -Pulmonology consulted, recommendations appreciated -Will call primary nephrology physician regarding their workup and overall impression -Procalcitonin <0.10 -Continue vancomycin until cultures return -Follow up pleural and sputum cultures from 10/1  Progressive fatigue and anorexia: Malignancy work up negative thus far, so unlikely that underlying malignancy causing worsening fatigue and anorexia. I am worried about patient's nutritional status as she reports decreased intake and weight loss throughout the months leading up to admission. Her albumin on admission was 3.1, suggesting protein malnutrition. Will call -Will call primary nephrology physician regarding their workup and overall impression -Nutrition consulted, appreciate recommendations -Continue  PT/OT therapy  Possible bilateral Internal Jugular DVT, unknown chronicity: Patient had bilateral non-occlusive DVT in jugular veins per notes  from vascular lab on 05/01/2017. Note still preliminary but will discuss therapy with patient, which would consist of increasing Eliquis to 10 mg BID for 7 days then maintenance dose of 5 mg BID.  -Discuss risk/benefits of treatment of DVT with anticoagulation with patient -Follow up vascular lab report from 05/01/17  Hyponatremia: Patient's sodium has been down trending this admission, 135 on arrival but decreased to 129 on this AM lab. Likely 2/2 decreased PO intake. Will continue to monitor with daily labs and consider urine studies for further workup.  -Daily BMP  Fluids: None Diet: Regular diet, supplementation as per nutrition consult DVT prophylaxis: Eliquis 2.5 mg BID  Dispo: Anticipated discharge pending clinical improvement.   Thomasene Ripple, MD 05/03/2017, 7:08 AM Pager: [phone]

## 2017-05-03 NOTE — Addendum Note (Signed)
Addended by: Earl Lagos on: 05/03/2017 03:03 PM   Modules accepted: Level of Service

## 2017-05-03 NOTE — Progress Notes (Signed)
  Date: 05/03/2017  Patient name: [name]  Medical record number: [id number]  Date of birth: [birth year]   I have seen and evaluated this patient and I have discussed the plan of care with the house staff. Please see their note for complete details. I concur with their findings with the following additions/corrections:   Seen together on rounds this morning. Still endorses very little energy and severe dyspnea on exertion, as well as spells of feeling "sick". Appetite and intake remain poor. Although initial pleural fluid culture growing staph epidermidis was thought to be a contaminant, repeat pleural fluid culture suggested by pulmonology yesterday again shows GPC's. We'll continue vancomycin for now and await culture results.  Given her overall decline over the past year or so including a 31 pound weight loss, I remain very concerned about an underlying infectious process or malignancy given her immunosuppressed state. We will reach out to her nephrology office. Unfortunately, her primary nephrologist just retired and she has not yet seen the nephrologist assuming her care.  Anne Shutter, MD 05/03/2017, 3:46 PM

## 2017-05-03 NOTE — Plan of Care (Signed)
Problem: Physical Regulation: Goal: Will remain free from infection Outcome: Progressing Patient aware of sterile technique to drain pleurix drain. Aware of need to continue IV antibiotics to treat possible infection.

## 2017-05-04 DIAGNOSIS — I82C13 Acute embolism and thrombosis of internal jugular vein, bilateral: Secondary | ICD-10-CM

## 2017-05-04 LAB — EPSTEIN BARR VRS(EBV DNA BY PCR)
EBV DNA QN by PCR: NEGATIVE copies/mL
log10 EBV DNA Qn PCR: UNDETERMINED log10 copy/mL

## 2017-05-04 LAB — CBC
HCT: 40.6 % (ref 36.0–46.0)
HEMOGLOBIN: 13.4 g/dL (ref 12.0–15.0)
MCH: 29.3 pg (ref 26.0–34.0)
MCHC: 33 g/dL (ref 30.0–36.0)
MCV: 88.8 fL (ref 78.0–100.0)
PLATELETS: 249 10*3/uL (ref 150–400)
RBC: 4.57 MIL/uL (ref 3.87–5.11)
RDW: 13.2 % (ref 11.5–15.5)
WBC: 7 10*3/uL (ref 4.0–10.5)

## 2017-05-04 LAB — BASIC METABOLIC PANEL
ANION GAP: 12 (ref 5–15)
BUN: 14 mg/dL (ref 6–20)
CHLORIDE: 98 mmol/L — AB (ref 101–111)
CO2: 20 mmol/L — ABNORMAL LOW (ref 22–32)
CREATININE: 1.35 mg/dL — AB (ref 0.44–1.00)
Calcium: 10 mg/dL (ref 8.9–10.3)
GFR calc Af Amer: 41 mL/min — ABNORMAL LOW (ref >60)
GFR calc non Af Amer: 35 mL/min — ABNORMAL LOW (ref >60)
Glucose, Bld: 101 mg/dL — ABNORMAL HIGH (ref 65–99)
POTASSIUM: 4.3 mmol/L (ref 3.5–5.1)
SODIUM: 130 mmol/L — AB (ref 135–145)

## 2017-05-04 LAB — CMV DNA, QUANTITATIVE, PCR
CMV DNA Quant: NEGATIVE IU/mL
Log10 CMV Qn DNA Pl: UNDETERMINED log10 IU/mL

## 2017-05-04 MED ORDER — APIXABAN 5 MG PO TABS
10.0000 mg | ORAL_TABLET | Freq: Two times a day (BID) | ORAL | Status: DC
  Administered 2017-05-05 – 2017-05-08 (×7): 10 mg via ORAL
  Filled 2017-05-04 (×7): qty 2

## 2017-05-04 NOTE — Progress Notes (Signed)
Subjective:  Patient reports another episode of "feeling bad" this AM. She denies specific symptoms but rather endorses general malaise. She states that her episode resolved by the time the rounding team arrived. She states that she continues to have trouble getting up and moving around her room. She states that the nutritional supplements ordered have made her feel better.  Objective:  Vital signs in last 24 hours: Vitals:   05/03/17 1733 05/03/17 2020 05/03/17 2121 05/04/17 0645  BP: 122/66 131/62  (!) 127/57  Pulse: 66 72  76  Resp: Temp: 98 F (36.7 C) 98.6 F (37 C)  97.8 F (36.6 C)  TempSrc: Oral     SpO2: 100% 100%  96%  Weight:   130 lb (59 kg)   Height:       Physical Exam  Constitutional: She appears well-developed and well-nourished. No distress.  HENT:  Mouth/Throat: Oropharynx is clear and moist.  Eyes: Conjunctivae are normal.  Neck: JVD present.  Cardiovascular: Normal rate and regular rhythm.  Exam reveals no friction rub.   No murmur heard. Pulmonary/Chest: Effort normal. No respiratory distress. She has no wheezes. She has no rales.  Abdominal: Soft. She exhibits no distension. There is no tenderness. There is no guarding.  Musculoskeletal: Normal range of motion. She exhibits no edema or tenderness.  No obvious worrisome skin lesions suggesting melanoma. Largest mole on R temple is quarter size and well circumscribed without ulceration or chronic skin changes.   Lymphadenopathy:    She has no cervical adenopathy.  Skin: Skin is warm and dry. No rash noted. No erythema. No pallor.   Assessment/Plan:  Principal Problem:   Shortness of breath Active Problems:   History of renal transplant   Pleural effusion, bilateral   CAP (community acquired pneumonia)   Fatigue   Cough with sputum   Protein-calorie malnutrition (HCC)   Personal history of immunosupression therapy   Anorexia  [name] is an 83 yo with PMH of T2DM, CKD s/p kidney  transplant in 2003 on immunosuppression, diastolic CHF, and CAD who presented on 04/18/2017 with increased productive cough, worsening SOB, and worsening general malaise that caused inability to complete ADLs. The patient was admitted to the internal medicine teaching service for management. The specific problems addressed during this admission are as follows:  SOB, productive cough, and probable ZOX:WRUEAVW continues to endorse productive cough and general malaise. Pleural fluid has remained serosanguinous throughout admission. Awaiting microbiology studies to determine if patient still needs antibiotics. Running EBV and CMV PCR.  -Pulmonology consulted, recommendations appreciated -Continue vancomycin until cultures result -Follow up pleural and sputum cultures from 10/1, EBV/CMV PCR, and AFB of sputum culture  Progressive fatigue and anorexia: Malignancy work up negative thus far, so unlikely that underlying malignancy causing worsening fatigue and anorexia. -Nutrition consulted, appreciate recommendations -Continue PT/OT therapy  Possible bilateral Internal Jugular DVT, unknown chronicity or provocation: Risks and benefits discussed with patient and she is agreeable to treatment as outlined below.  -Starting on 10/4, patient will get 10 mg Eliquis BID for 7 days. This will be reduced to 5 mg BID for at least 3 months duration or longer, as it is unclear about the chronicity of DVT and whether or not the events were provoked. -Will defer duration of therapy to PCP  Hyponatremia: Patient's sodium has been down trending this admission, 135 on arrival. Likely 2/2 decreased PO intake.  -Daily BMP  Fluids: None Diet: Regular diet, supplementation as  per nutrition consult DVT prophylaxis:Eliquis 2.5 mg one more day, Eliquis 10 mg starting on 10/4  Dispo: Anticipated discharge pending clinical improvement.   Joy Lesches, MD 05/04/2017, 6:58 AM Pager: [phone]

## 2017-05-04 NOTE — Progress Notes (Signed)
  Date: 05/04/2017  Patient name: [name]  Medical record number: [id number]  Date of birth: [birth year]   I have seen and evaluated this patient and I have discussed the plan of care with the house staff. Please see their note for complete details. I concur with their findings with the following additions/corrections:   Seen together on rounds this morning. Continues to complain of intermittent episodes of feeling "sick", although has difficulty detailing what is involved in these episodes. Dr. Saunders Revel was able to speak with her outpatient nephrology office, and they recommended evaluating for CMV and EBV as well, which were sent this morning. We will continue the vancomycin for now until the second set of pleural fluid cultures results. We also will begin treatment for her jugular vein thromboses, after we had an extensive discussion with her the risks and benefits.  Anne Shutter, MD 05/04/2017, 4:07 PM

## 2017-05-04 NOTE — Progress Notes (Signed)
Spoke with patient about [facility] services. Patient can benefit from [facility] RN and PT, and would like to use [facility] as she has used them in the past. Referral made to Lupita Leash, liaison [facility].

## 2017-05-04 NOTE — Progress Notes (Signed)
Occupational Therapy Treatment Patient Details Name: [name] MRN: [id number] DOB: [birth year] Today's Date: 05/04/2017    History of present illness [name] is an 83 year old female who presented with a 3-week history of cough, increased sputum production, SOB on exertion, and worsening fatigue. 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 Fib, CRF with hypoxia, gout and CAD status post PCI.   OT comments  Pt continues to demonstrate poor activity tolerance and frustration due to not knowing why. Pt stood x 30 seconds at sink and then needed to sit to complete grooming task. Donned and doffed socks without help and pivoted to 3 in 1 with min guard assist. Will continue to follow.  Follow Up Recommendations  Home health OT;Supervision/Assistance - 24 hour    Equipment Recommendations  None recommended by OT    Recommendations for Other Services      Precautions / Restrictions Precautions Precautions: Fall       Mobility Bed Mobility Overal bed mobility: Modified Independent                Transfers Overall transfer level: Needs assistance Equipment used: Rolling walker (2 wheeled);None Transfers: Sit to/from Stand Sit to Stand: Min guard         General transfer comment: increased time and effort    Balance     Sitting balance-Leahy Scale: Good       Standing balance-Leahy Scale: Fair                             ADL either performed or assessed with clinical judgement   ADL Overall ADL's : Needs assistance/impaired     Grooming: Oral care;Sitting;Standing Grooming Details (indicate cue type and reason): required sitting after about 30 seconds in standing during toothbrushing.             Lower Body Dressing: Set up;Sitting/lateral leans Lower Body Dressing Details (indicate cue type and reason): socks Toilet Transfer: Min guard;Stand-pivot;BSC   Toileting- Clothing Manipulation and Hygiene:  Min guard;Sit to/from stand       Functional mobility during ADLs: Minimal assistance;Rolling walker General ADL Comments: Pt with very poor endurance and reports of feeling sick to her stomach.      Vision       Perception     Praxis      Cognition Arousal/Alertness: Awake/alert Behavior During Therapy: WFL for tasks assessed/performed Overall Cognitive Status: Within Functional Limits for tasks assessed                                          Exercises     Shoulder Instructions       General Comments      Pertinent Vitals/ Pain       Pain Assessment: No/denies pain  Home Living                                          Prior Functioning/Environment              Frequency  Min 2X/week        Progress Toward Goals  OT Goals(current goals can now be found in the care plan section)  Progress towards OT goals:  Not progressing toward goals - comment (fatigue limits)  Acute Rehab OT Goals Patient Stated Goal: to go home OT Goal Formulation: With patient Time For Goal Achievement: 05/16/17 Potential to Achieve Goals: Good  Plan Discharge plan remains appropriate    Co-evaluation                 AM-PAC PT "6 Clicks" Daily Activity     Outcome Measure   Help from another person eating meals?: None Help from another person taking care of personal grooming?: A Little Help from another person toileting, which includes using toliet, bedpan, or urinal?: A Little Help from another person bathing (including washing, rinsing, drying)?: A Little Help from another person to put on and taking off regular upper body clothing?: None Help from another person to put on and taking off regular lower body clothing?: A Little 6 Click Score: 20    End of Session Equipment Utilized During Treatment: Oxygen  OT Visit Diagnosis: Unsteadiness on feet (R26.81);Muscle weakness (generalized) (M62.81)   Activity Tolerance Patient  limited by fatigue   Patient Left in bed;with call bell/phone within reach   Nurse Communication          Time: 1610-9604 OT Time Calculation (min): 24 min  Charges: OT General Charges $OT Visit: 1 Visit OT Treatments $Self Care/Home Management : 23-37 mins  05/04/2017 Martie Round, OTR/L Pager: [phone]   Iran Planas, [place] [facility] 05/04/2017, 9:50 AM

## 2017-05-05 LAB — BASIC METABOLIC PANEL
Anion gap: 9 (ref 5–15)
BUN: 16 mg/dL (ref 6–20)
CHLORIDE: 100 mmol/L — AB (ref 101–111)
CO2: 21 mmol/L — AB (ref 22–32)
CREATININE: 1.35 mg/dL — AB (ref 0.44–1.00)
Calcium: 9.6 mg/dL (ref 8.9–10.3)
GFR calc non Af Amer: 35 mL/min — ABNORMAL LOW (ref >60)
GFR, EST AFRICAN AMERICAN: 41 mL/min — AB (ref >60)
Glucose, Bld: 141 mg/dL — ABNORMAL HIGH (ref 65–99)
Potassium: 4.2 mmol/L (ref 3.5–5.1)
Sodium: 130 mmol/L — ABNORMAL LOW (ref 135–145)

## 2017-05-05 LAB — CBC
HCT: 39 % (ref 36.0–46.0)
Hemoglobin: 12.7 g/dL (ref 12.0–15.0)
MCH: 29 pg (ref 26.0–34.0)
MCHC: 32.6 g/dL (ref 30.0–36.0)
MCV: 89 fL (ref 78.0–100.0)
PLATELETS: 256 10*3/uL (ref 150–400)
RBC: 4.38 MIL/uL (ref 3.87–5.11)
RDW: 13.5 % (ref 11.5–15.5)
WBC: 6 10*3/uL (ref 4.0–10.5)

## 2017-05-05 LAB — CULTURE, BODY FLUID-BOTTLE

## 2017-05-05 LAB — CULTURE, BODY FLUID W GRAM STAIN -BOTTLE

## 2017-05-05 LAB — ACID FAST SMEAR (AFB, MYCOBACTERIA): Acid Fast Smear: NEGATIVE

## 2017-05-05 NOTE — Progress Notes (Signed)
Subjective: Patient was seen laying comfortably in bed this AM in no acute distress. She states that her [place] was bothering her all night, so she removed it and has been breathing comfortably ever since. She denies any current pain. She continues to ask why she feels so fatigued all the time and was informed of the progress of her workup thus far. Her questions were answered to her satisfaction and she knows that we are still awaiting microbiology tests to ensure she does not have atypical infection or infected pleural fluid.  Objective:  Vital signs in last 24 hours: Vitals:   05/04/17 0645 05/04/17 1000 05/04/17 1725 05/04/17 2043  BP: (!) 127/57 (!) 126/57 (!) 122/52 132/67  Pulse: 76 78 68 68  Resp: Temp: 97.8 F (36.6 C) 98 F (36.7 C) 98 F (36.7 C) 98.1 F (36.7 C)  TempSrc:  Oral Oral Oral  SpO2: 96% 98% 98% 100%  Weight:    131 lb (59.4 kg)  Height:       Physical Exam  Constitutional:  Elderly appearing woman laying comfortably in bed without [place] in place in no acute distress  HENT:  Mouth/Throat: Oropharynx is clear and moist.  Eyes: Conjunctivae and EOM are normal.  Cardiovascular: Normal rate, regular rhythm and intact distal pulses.  Exam reveals no friction rub.   No murmur heard. Pulmonary/Chest: Effort normal. No respiratory distress. She has no wheezes.  No crackles appreciated.  Abdominal: Soft. She exhibits no distension. There is no tenderness. There is no guarding.  Musculoskeletal: She exhibits no edema (of bilateral lower extremities) or tenderness (of bilaterial lower extremities).  Lymphadenopathy:    She has no cervical adenopathy.  Skin: Skin is warm and dry. Capillary refill takes less than 2 seconds. No rash noted. No erythema.   Assessment/Plan:  Principal Problem:   Shortness of breath Active Problems:   History of renal transplant   Pleural effusion, bilateral   CAP (community acquired pneumonia)   Fatigue   Cough with  sputum   Protein-calorie malnutrition (HCC)   Personal history of immunosupression therapy   Anorexia  [name] is an 83 yo with PMH of T2DM, CKD s/p kidney transplant in 2003 on immunosuppression, diastolic CHF, and CAD who presented on 04/04/2017 with increased productive cough, worsening SOB, and progressively worsening malaise that caused inability to complete ADLs. The patient was admitted to the internal medicine teaching service for management. The specific problems addressed during this admission are as follows:  SOB, productive cough, and probable ZOX:WRUEAVW continues to endorse productive cough and pleural fluid has remained serosanguinous throughout admission. Awaiting microbiology studies to determine next step. -Pulmonology consulted and signed off, will consult as outpatient -Will contact infectious disease today -Continue vancomycin until pleural cultures result, will call microbiology lab  -CMV/EBV PCR negative -Follow up sputum AFB culture -Follow up pleural and sputum cultures from 10/1  Progressive fatigue and anorexia: Malignancy work up, including CT of neck and chest along with MRI abdomen and pelvis, has been negative thus far. Her CBC with differential is not concerning for hematologic malignancy. Her physical exam is not concerning for breast or skin cancer that could be causing her symptoms. Patient found to have protein calorie malnutrition on admission and nutrition consultation recommended protein supplementation TID.  -Nutrition consulted, appreciate recommendations -Continue PT/OT therapy  Possible bilateral Internal Jugular DVT, unknown chronicity or provocation:Risks and benefits discussed with patient and she is agreeable to treatment as outlined below.  -  Day one of seven, 10 mg Eliquis BID -Will defer duration of therapy to PCP, likely will need 5 mg BID for 3-6 months before returning to regimen of 2.5 mg BID for A-fib  anticoagulation  Hyponatremia:Patient's sodium has been down trending this admission, 135 on arrival stable at 130 for past two days. Likely 2/2 decreased PO intake. Following nutrition recommendations. -Daily BMP  Fluids:None Diet: Regular diet, supplementation as per nutrition consult DVT prophylaxis:Eliquis 10 mg starting today  Dispo: Anticipated discharge in approximately 1 day(s).   Joy Lesches, MD 05/05/2017, 7:53 AM Pager: [phone]

## 2017-05-05 NOTE — Progress Notes (Addendum)
  Date: 05/05/2017  Patient name: [name]  Medical record number: [id number]  Date of birth: Jun 05, 1932   I have seen and evaluated this patient and I have discussed the plan of care with the house staff. Please see their note for complete details. I concur with their findings with the following additions/corrections:   Unfortunately, she remains extremely symptomatic with any exertion. No clear etiology for her fatigue and severe malnutrition. I continue to have high suspicion for occult malignancy or infection. She now has to pleural fluid culture is growing staph epidermidis. We will consult infectious disease to assist [place] with evaluating and treating this. 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  Anne Shutter, MD 05/05/2017, 5:14 PM

## 2017-05-05 NOTE — Progress Notes (Signed)
Pharmacy Antibiotic Note  [name] is a 83 y.o. female who presented on 9/27 with a 3 week history of cough, SOB on exertion, and worsening fatigue. The patient was noted to have a R-pleur-x catheter in place since Dec '17 that she drains every other day at home for issues with recurrent pleural effusions.  Cultures this admission have shown CoNS - possible contaminant vs pathogen. Repeat cx from 10/1 are growing GPC but still pending finalization. Pharmacy is on board for Vancomycin dosing while awaiting these culture results.   The patient's renal function is at her baseline - SCr 1.35, CrCl~20-30 ml/min. The Vancomycin dose remains appropriate at this time.   Plan: 1. Continue Vancomycin 500 mg IV every 24 hours 2. Will continue to follow renal function, culture results, LOT, and antibiotic de-escalation plans    Height:  (160 cm) Weight: 131 lb (59.4 kg) IBW/kg (Calculated) : 52.4  Temp (24hrs), Avg:98 F (36.7 C), Min:98 F (36.7 C), Max:98.1 F (36.7 C)   Recent Labs Lab 05/01/17 0225 05/02/17 0228 05/03/17 0242 05/04/17 0503 05/05/17 0327  WBC 6.6 5.2 5.1 7.0 6.0  CREATININE 1.54* 1.60* 1.45* 1.35* 1.35*    Estimated Creatinine Clearance: 26.1 mL/min (A) (by C-G formula based on SCr of 1.35 mg/dL (H)).    Allergies  Allergen Reactions  . Sulfa Drugs Cross Reactors Swelling    Cefepime 9/27>>10/1 Vancomcyin 10/1>>  9/26 BCx: negative  9/27 R-Pleural fluid >> MRSE 10/1 RCx (sputum) >> 10/1 R-Pleural fluid >> GPC 10/3 AFB >>  Thank you for allowing pharmacy to be a part of this patient's care.  Georgina Pillion, PharmD, BCPS Clinical Pharmacist Pager: [phone] Clinical phone for 05/05/2017 from 7a-3:30p: [phone] If after 3:30p, please call main pharmacy at: [phone] 05/05/2017 11:56 AM

## 2017-05-06 ENCOUNTER — Inpatient Hospital Stay (HOSPITAL_COMMUNITY): Payer: Medicare Other

## 2017-05-06 DIAGNOSIS — B957 Other staphylococcus as the cause of diseases classified elsewhere: Secondary | ICD-10-CM | POA: Clinically undetermined

## 2017-05-06 DIAGNOSIS — J90 Pleural effusion, not elsewhere classified: Secondary | ICD-10-CM

## 2017-05-06 DIAGNOSIS — Z9225 Personal history of immunosupression therapy: Secondary | ICD-10-CM

## 2017-05-06 DIAGNOSIS — A498 Other bacterial infections of unspecified site: Secondary | ICD-10-CM | POA: Clinically undetermined

## 2017-05-06 LAB — SEDIMENTATION RATE: Sed Rate: 45 mm/hr — ABNORMAL HIGH (ref 0–22)

## 2017-05-06 LAB — BASIC METABOLIC PANEL
Anion gap: 9 (ref 5–15)
BUN: 17 mg/dL (ref 6–20)
CO2: 21 mmol/L — ABNORMAL LOW (ref 22–32)
Calcium: 9.7 mg/dL (ref 8.9–10.3)
Chloride: 100 mmol/L — ABNORMAL LOW (ref 101–111)
Creatinine, Ser: 1.4 mg/dL — ABNORMAL HIGH (ref 0.44–1.00)
GFR calc Af Amer: 39 mL/min — ABNORMAL LOW (ref >60)
GFR calc non Af Amer: 34 mL/min — ABNORMAL LOW (ref >60)
Glucose, Bld: 117 mg/dL — ABNORMAL HIGH (ref 65–99)
Potassium: 4.4 mmol/L (ref 3.5–5.1)
Sodium: 130 mmol/L — ABNORMAL LOW (ref 135–145)

## 2017-05-06 LAB — CORTISOL-AM, BLOOD: Cortisol - AM: 16.3 ug/dL (ref 6.7–22.6)

## 2017-05-06 LAB — CBC WITH DIFFERENTIAL/PLATELET
Basophils Absolute: 0 10*3/uL (ref 0.0–0.1)
Basophils Relative: 0 %
Eosinophils Absolute: 0.2 10*3/uL (ref 0.0–0.7)
Eosinophils Relative: 4 %
HCT: 37.2 % (ref 36.0–46.0)
Hemoglobin: 12.3 g/dL (ref 12.0–15.0)
Lymphocytes Relative: 17 %
Lymphs Abs: 1 10*3/uL (ref 0.7–4.0)
MCH: 29.3 pg (ref 26.0–34.0)
MCHC: 33.1 g/dL (ref 30.0–36.0)
MCV: 88.6 fL (ref 78.0–100.0)
Monocytes Absolute: 0.8 10*3/uL (ref 0.1–1.0)
Monocytes Relative: 13 %
Neutro Abs: 4.1 10*3/uL (ref 1.7–7.7)
Neutrophils Relative %: 66 %
Platelets: 271 10*3/uL (ref 150–400)
RBC: 4.2 MIL/uL (ref 3.87–5.11)
RDW: 13.6 % (ref 11.5–15.5)
WBC: 6.1 10*3/uL (ref 4.0–10.5)

## 2017-05-06 LAB — C-REACTIVE PROTEIN: CRP: 5.2 mg/dL — ABNORMAL HIGH (ref <1.0)

## 2017-05-06 LAB — HIV ANTIBODY (ROUTINE TESTING W REFLEX): HIV Screen 4th Generation wRfx: NONREACTIVE

## 2017-05-06 LAB — CK: Total CK: 29 U/L — ABNORMAL LOW (ref 38–234)

## 2017-05-06 MED ORDER — SENNOSIDES-DOCUSATE SODIUM 8.6-50 MG PO TABS: 1.0000 | ORAL_TABLET | Freq: Once | ORAL | Status: DC

## 2017-05-06 MED ORDER — MAGNESIUM HYDROXIDE 400 MG/5ML PO SUSP
5.0000 mL | Freq: Every day | ORAL | Status: DC | PRN
  Administered 2017-05-06 – 2017-05-11 (×3): 5 mL via ORAL
  Filled 2017-05-06 (×3): qty 30

## 2017-05-06 MED ORDER — GADOBENATE DIMEGLUMINE 529 MG/ML IV SOLN
6.0000 mL | Freq: Once | INTRAVENOUS | Status: AC | PRN
  Administered 2017-05-06: 6 mL via INTRAVENOUS

## 2017-05-06 MED ORDER — PRO-STAT SUGAR FREE PO LIQD
30.0000 mL | Freq: Two times a day (BID) | ORAL | Status: DC
  Administered 2017-05-07 – 2017-05-16 (×18): 30 mL via ORAL
  Filled 2017-05-06 (×22): qty 30

## 2017-05-06 MED ORDER — LORAZEPAM 0.5 MG PO TABS
0.5000 mg | ORAL_TABLET | Freq: Once | ORAL | Status: AC
  Administered 2017-05-06: 0.5 mg via ORAL
  Filled 2017-05-06: qty 1

## 2017-05-06 MED ORDER — MAGNESIUM HYDROXIDE 400 MG/5ML PO SUSP: 15.0000 mL | Freq: Once | ORAL | Status: DC

## 2017-05-06 MED ORDER — DOCUSATE SODIUM 100 MG PO CAPS: 100.0000 mg | ORAL_CAPSULE | Freq: Every day | ORAL | Status: DC | PRN

## 2017-05-06 MED ORDER — POLYETHYLENE GLYCOL 3350 17 G PO PACK: 17.0000 g | PACK | Freq: Every day | ORAL | Status: DC

## 2017-05-06 NOTE — Plan of Care (Signed)
Problem: Physical Regulation: Goal: Ability to maintain clinical measurements within normal limits will improve Outcome: Not Progressing Patient not able to tolerate walking far distances. States that she gets short of breath. Patient also had decreased appetite this am. Ate peanut butter with medications but did not eat any of her breakfast.

## 2017-05-06 NOTE — Progress Notes (Signed)
  Subjective: Patient examined Pleurx cath site non-tender Fluid removed not changed No sign, sx of infection Cont to use catheter on previous schedule Objective: Vital signs in last 24 hours: Temp:  [97.7 F (36.5 C)-98 F (36.7 C)] 97.8 F (36.6 C) (10/05 0456) Pulse Rate:  [58-72] 58 (10/05 0456) Cardiac Rhythm: Heart block;Normal sinus rhythm;Bundle branch block (10/05 0700) Resp:  [16-18] 16 (10/05 0456) BP: (112-130)/(49-82) 125/82 (10/05 0456) SpO2:  [96 %-100 %] 100 % (10/05 0456)  Hemodynamic parameters for last 24 hours:  stable  Intake/Output from previous day: 10/04 0701 - 10/05 0700 In: 120 [P.O.:120] Out: 500 [Urine:500] Intake/Output this shift: No intake/output data recorded.  pleurx site clean,dry,nontender  Lab Results:  Recent Labs  05/04/17 0503 05/05/17 0327  WBC 7.0 6.0  HGB 13.4 12.7  HCT 40.6 39.0  PLT 249 256   BMET:  Recent Labs  05/04/17 0503 05/05/17 0327  NA 130* 130*  K 4.3 4.2  CL 98* 100*  CO2 20* 21*  GLUCOSE 101* 141*  BUN 14 16  CREATININE 1.35* 1.35*  CALCIUM 10.0 9.6    PT/INR: No results for input(s): LABPROT, INR in the last 72 hours. ABG    Component Value Date/Time   PHART 7.474 (H) 12/20/2015 1109   HCO3 18.4 (L) 12/20/2015 1109   TCO2 19.2 12/20/2015 1109   ACIDBASEDEF 4.6 (H) 12/20/2015 1109   O2SAT 94.2 12/20/2015 1109   CBG (last 3)  No results for input(s): GLUCAP in the last 72 hours.  Assessment/Plan: S/P R pleurx Cont catheter therapy    LOS: 8 days    Kathlee Nations Trigt III 05/06/2017

## 2017-05-06 NOTE — Progress Notes (Signed)
PT Cancellation Note  Patient Details Name: [name] MRN: [id number] DOB: [birth year]   Cancelled Treatment:    Reason Eval/Treat Not Completed: Fatigue/lethargy limiting ability to participate; patient reports up to bathroom earlier today and now too fatigued to get back up but will walk with PT later this pm.  Will attempt again later today.   [name] 05/06/2017, 12:10 PM  Sheran Lawless, [place] [phone] 05/06/2017

## 2017-05-06 NOTE — Progress Notes (Signed)
Subjective:  Patient was seen laying in bed this AM in no acute distress, but clearly uncomfortable. She states that she continues to "feel bad" and that she does not understand why. She describes an episode of weakness which occurred this AM as result of trying to go to the bathroom. She also complains of nausea and increased constipation, which made her episode of feeling bad this AM worse. Patient asked for laxatives and updates regarding her workup thus far.  Objective:  Vital signs in last 24 hours: Vitals:   05/05/17 1720 05/05/17 2300 05/06/17 0456 05/06/17 0956  BP: 129/64 (!) 112/49 125/82 129/75  Pulse: 65 64 (!) 58 76  Resp: Temp: 97.8 F (36.6 C) 97.7 F (36.5 C) 97.8 F (36.6 C) 98 F (36.7 C)  TempSrc: Oral Oral Oral Oral  SpO2: 100% 98% 100% 100%  Weight:      Height:       Physical Exam  Constitutional:  Thin appearing woman tearful but in no acute distress  Cardiovascular: Normal rate, regular rhythm and intact distal pulses.   No murmur heard. Pulmonary/Chest: Effort normal. No respiratory distress. She has no wheezes.  No crackles appreciated  Abdominal: Soft. She exhibits no distension. There is no tenderness. There is no guarding.  Musculoskeletal: She exhibits no edema (of bilateral lower extremities) or tenderness (of bilateral lower extremities).  Skin: Skin is warm and dry. Capillary refill takes less than 2 seconds. No rash noted. No erythema.   Assessment/Plan:  Principal Problem:   Shortness of breath Active Problems:   History of renal transplant   Pleural effusion, bilateral   CAP (community acquired pneumonia)   Fatigue   Cough with sputum   Protein-calorie malnutrition (HCC)   Personal history of immunosupression therapy   Anorexia  [name] is an 83 yo with PMH of T2DM, CKD s/p kidney transplant in 2003 on immunosuppression, diastolic CHF, and CAD who presented on 04/06/2017 with increased productive cough, worsening SOB,  and progressively worsening malaise that caused inability to complete ADLs. The patient was admitted to the internal medicine teaching service for management. The specific problems addressed during this admission are as follows:  SOB, productive cough, and probable RUE:[id number] continues to endorse productive cough and pleural fluid has remained serosanguinous throughout admission. Microbiology studies reveal staph epi x 2 and ID was consulted. They suggest colonization of catheter rather than pleural infection. Will d/c antibiotics and see how patient does.  -Pulmonology consulted and signed off, will follow up as outpatient -Cardiothoracic surgery consult suggested no infection -CMV/EBV PCR negative, AFB negative -Discontinue vancomycin  Progressive fatigue and anorexia: Malignancy work up, including CT of neck and chest along with MRI abdomen and pelvis, has been negative thus far, although imaging of possible renal mass incomplete 2/2 lack of contrast. Her CBC with differential is not concerning for hematologic malignancy. Her physical exam is not concerning for breast or skin cancer that could be causing her symptoms. Patient found to have protein calorie malnutrition on admission and nutrition consultation recommended protein supplementation TID.  -Nutrition consulted, appreciate recommendations -Continue PT/OT therapy -MRI abdomen with contrast to better evaluate possible renal mass observed on initial imaging  Possible bilateral Internal Jugular DVT, unknown chronicity or provocation:Risks and benefits discussed with patient and she is agreeable to treatment as outlined below.  -Day one of seven, 10 mg Eliquis BID -Will defer duration of therapy to PCP, likely will need 5 mg BID for 3-6 months  before returning to regimen of 2.5 mg BID for A-fib anticoagulation  Hyponatremia:Patient's sodium has been down trending this admission, 135 on arrival stable at 130. Likely 2/2 decreased PO  intake. Following nutrition recommendations. -Daily BMP  Fluids:None Diet: Regular diet, supplementation as per nutrition consult DVT prophylaxis:Eliquis 10 mg starting today  Dispo: Anticipated discharge in approximately 1-2 day(s).   [name], MD 05/06/2017, 11:58 AM Pager: [phone]

## 2017-05-06 NOTE — Progress Notes (Signed)
Nutrition Follow-up  DOCUMENTATION CODES:   Severe malnutrition in context of chronic illness  INTERVENTION:  - Continue Boost Breeze po TID, each supplement provides 250 kcal and 9 grams of protein - Add ProStat BID, each supplement provides 100 kcal and 15 grams protein - Promoted PO intake  NUTRITION DIAGNOSIS:   Malnutrition (severe) related to chronic illness (CHF) as evidenced by moderate depletion of body fat, severe depletion of muscle mass, energy intake < or equal to 75% for > or equal to 1 month.  Ongoing  GOAL:   Patient will meet greater than or equal to 90% of their needs  Progressing  MONITOR:   PO intake, Supplement acceptance, Labs, I & O's  ASSESSMENT:   Pt with PMH of CAD, CHF, ESRD prior to renal transplant (2003), Type II DM,  presents with 3 week cough with sputum production, SOB, and fatigue found pleural effusion.   Pt reports continued poor appetite and fatigue. Pt reports nausea currently but was just given medications. Pt reports constipation and that she requested Milk of Magnesia. Per chart pt is consuming 0-65% of meals at this time, which continues to be inadequate. Pt reports the Boost Breeze supplement is a bit sweet and plans to dilute it with some water. RD mentioned Pro-Stat supplements, pt amenable stating "I will try anything" Per chart pt with pleurx catheter, pt drains ~ every other day at baseline (200c-800cc). Pt to continue therapy and schedule.   Labs reviewed; Na 130 Medications reviewed; Pepcid, PRN Colace, PRN Milk of Magnesia, PRN Senokot-S  Diet Order:  Diet Heart Room service appropriate? Yes; Fluid consistency: Thin  Skin:  Reviewed, no issues  Last BM:  05/03/17  Height:   Ht Readings from Last 1 Encounters:  04/16/2017  (1.6 m)    Weight:   Wt Readings from Last 1 Encounters:  05/04/17 131 lb (59.4 kg)    Ideal Body Weight:  52.3 kg  BMI:  Body mass index is 23.21 kg/m.  Estimated Nutritional Needs:    Kcal:  1400-1600  Protein:  75-90 grams  Fluid:  >/= 1.4 L/d  EDUCATION NEEDS:   Education needs no appropriate at this time  Fransisca Kaufmann, MS, RDN, LDN 05/06/2017 11:44 AM

## 2017-05-06 NOTE — Progress Notes (Signed)
PT Cancellation Note  Patient Details Name: [name] MRN: [id number] DOB: [birth year]   Cancelled Treatment:    Reason Eval/Treat Not Completed: Fatigue/lethargy limiting ability to participate; second attempt, pt just s/p bath and for pleurex drainage and MRI.  Declined PT again.  Will attempt again another day.   [name] 05/06/2017, 4:26 PM  Sheran Lawless, [place] [phone] 05/06/2017

## 2017-05-06 NOTE — Consult Note (Addendum)
Date of Admission:  04/29/2017         Reason for Consult: Staphylococcus Epidermidis in pleural fluid   Referring Provider: Dr. Sandre Kitty   Assessment: 1. Staphylococcus Epidermidis in pleural fluid=  Denizen of her pleur-ex tube alone vs pathogen in empyema?  2. S/P renal transplant  3. Bilateral jugular vein thromboses  Plan: 1. CONFIRM how the sample of pleural fluid was obtained from catheter or via another approach? If from the catheter this culture likely just represents colonization of catheter with skin flora rather than infection  2. If it is a colonizer then DC antibiotics and observe her  3. If there is still lack of clarity then would try to get pleural fluid via another approach and prior to that obtain a repeat CT  4. If this is an actual empyema then the pleur-ex catheter needs to removed and patient might need CVTS surgery  5. Also if the latter, I would prefer to get her off of IV vancomycin given risk of nephrotoxicyt and use Teflaro as inpatient  Principal Problem:   Shortness of breath Active Problems:   History of renal transplant   Pleural effusion, bilateral   CAP (community acquired pneumonia)   Fatigue   Cough with sputum   Protein-calorie malnutrition (HCC)   Personal history of immunosupression therapy   Anorexia   . amLODipine  5 mg Oral Daily  . apixaban  10 mg Oral BID  . famotidine  20 mg Oral Daily  . feeding supplement  1 Container Oral TID BM  . feeding supplement (PRO-STAT SUGAR FREE 64)  30 mL Oral BID  . mycophenolate  500 mg Oral BID  . tacrolimus  2 mg Oral BID    HPI: [name] is a 83 y.o. female   who presented on Septembert 26th with a three-week history of cough, increased sputum production, shortness of breath on exertion, and worsening fatigue. 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 Fib, chronic respiratory failure with  hypoxia, gout and coronary artery disease status post PCI. The patient states that she has had a cough for 3 weeks which is associated with the production of white and sometimes green sputum. She has felt weak for the past year since she was admitted to the hospital on four separate occasions for pneumonia. She attests to anorexia on most days, stating that she simply has no desire to eat. The patient stated that in the past she has typically waited until EMS was necessary for transport due to her severe weakness and in generally poor medical condition. On this occasion she called to schedule her own appointment and she felt so weak she was unable to make a cup of coffee without pausing multiple times to regroup.  She did endorse 2 chills first greater than 6 months, nausea but no vomiting, weight loss of 31 pounds since last year, cough, shortness of breath, generalized weakness and fatigue. In addition she states she often has serosanguineous discharge from her right pleurex catheter for recurrent pleural effusion. She was admitted from Internal Medicine with concern for PNA and possible infection of the pleural space.  CT on the 27th showed:   Small bilateral pleural effusions right worse than left with right-sided chest tube in place. Associated bibasilar atelectasis. Very subtle faint reticulonodular pattern of opacification over the mid to upper lungs bilaterally    Pleural fluid  was sent for analysis and there was 5846 WBC seen in fluid with 90% PMN, protein <3, LDH 96, glucose of 106, albumin 1.3  Cultures from the 27th yielded Coag Negative Staph Epidermidis.  I BELIEVE this sample was taken from the pleur-ex catheter rater than sample obtained via another route.Patient had been on cefepime from 04/28/17 through 9/0 then vancomycin from 10/1-- present date    Review of Systems: Review of Systems  Constitutional: Positive for malaise/fatigue and weight loss. Negative for chills,  diaphoresis and fever.  HENT: Negative for congestion, hearing loss, sore throat and tinnitus.   Eyes: Negative for blurred vision and double vision.  Respiratory: Positive for cough, sputum production and shortness of breath. Negative for wheezing.   Cardiovascular: Negative for chest pain, palpitations and leg swelling.  Gastrointestinal: Negative for abdominal pain, blood in stool, constipation, diarrhea, heartburn, melena, nausea and vomiting.  Genitourinary: Negative for dysuria, flank pain, frequency, hematuria and urgency.  Musculoskeletal: Negative for back pain, falls, joint pain and myalgias.  Skin: Negative for itching and rash.  Neurological: Positive for dizziness, weakness and headaches. Negative for sensory change, focal weakness and loss of consciousness.  Endo/Heme/Allergies: Does not bruise/bleed easily.  Psychiatric/Behavioral: Positive for depression. Negative for memory loss and suicidal ideas. The patient is not nervous/anxious.     Past Medical History:  Diagnosis Date  . Candidal esophagitis (HCC)    a. 03/2014.  Marland Kitchen Cholelithiasis   . Chronic diastolic CHF (congestive heart failure) (HCC)    a. 06/2011 Echo: EF 60-65%, no rwma, Gr1 DD, mild TR. // b. Echo 12/19/15: Severe LVH, EF 55-60%, normal wall motion, mild MR, moderate LAE, moderate TR, PASP 44 mmHg  . CKD (chronic kidney disease), stage III    a. in setting of prior ESRD and cadaveric tx in 2003.  Marland Kitchen Coronary artery disease    a. 01/2001 Cath/PCI: LAD 50p, 65m, D1 50-60, RCA 43m (3.0x13 BX Velocity BMS).// b. NSTEMI 5/17 (demand ischemia) in setting of AF with RVR, pneumonia, CKD, a/c HF >> Myoview 12/26/15: prob inf infarct, no ischemia, EF 52%, Low Risk >> med Rx  . Dyspnea   . Gastritis    a. 03/2014  . Gout    unconfirmed by joint aspiration  . History of bacteremia    a. 08/2004 Group A Strep bacteremia.  . Hypertensive heart disease   . Pneumonia   . S/P kidney transplant    a. due to FSGN, follows with  Dr. Vivia Birmingham in 2003. Was on HD prior to that.  . Type II diabetes mellitus (HCC)     Social History  Substance Use Topics  . Smoking status: Former Smoker    Quit date: 04/19/1969  . Smokeless tobacco: Never Used  . Alcohol use No    Family History  Problem Relation Age of Onset  . CAD Father   . CAD Brother   . CAD Unknown        Both parents and multiple siblings have died from coronary disease prior to age 78 and several in their 80's and 60's.   Allergies  Allergen Reactions  . Sulfa Drugs Cross Reactors Swelling    OBJECTIVE: Blood pressure 129/75, pulse 76, temperature 98 F (36.7 C), temperature source Oral, resp. rate 17, height  (1.6 m), weight 131 lb (59.4 kg), SpO2 100 %.  Physical Exam  Constitutional: She is oriented to person, place, and time and well-developed, well-nourished, and in no distress. No distress.  HENT:  Head: Normocephalic  and atraumatic.  Right Ear: External ear normal.  Left Ear: External ear normal.  Nose: Nose normal.  Mouth/Throat: Oropharynx is clear and moist. No oropharyngeal exudate.  Eyes: Conjunctivae and EOM are normal. No scleral icterus.  Neck: Normal range of motion. Neck supple.  Cardiovascular: Normal rate, regular rhythm and normal heart sounds.  Exam reveals no gallop and no friction rub.   No murmur heard. Pulmonary/Chest: Effort normal and breath sounds normal. No respiratory distress. She has no wheezes. She has no rales.  Abdominal: Soft. Bowel sounds are normal. She exhibits no distension. There is no tenderness. There is no rebound and no guarding.  Musculoskeletal: Normal range of motion. She exhibits no tenderness.  Neurological: She is alert and oriented to person, place, and time. Gait normal. Coordination normal.  Skin: Skin is warm and dry. No rash noted. She is not diaphoretic. No erythema. No pallor.  Psychiatric: Memory, affect and judgment normal. She exhibits a depressed mood.    Lab  Results Lab Results  Component Value Date   WBC 6.1 05/06/2017   HGB 12.3 05/06/2017   HCT 37.2 05/06/2017   MCV 88.6 05/06/2017   PLT 271 05/06/2017    Lab Results  Component Value Date   CREATININE 1.40 (H) 05/06/2017   BUN 17 05/06/2017   NA 130 (L) 05/06/2017   K 4.4 05/06/2017   CL 100 (L) 05/06/2017   CO2 21 (L) 05/06/2017    Lab Results  Component Value Date   ALT 10 (L) 04/28/2017   AST 22 Apr 28, 2017   ALKPHOS 89 28-Apr-2017   BILITOT 0.6 April 28, 2017     Microbiology: Recent Results (from the past 240 hour(s))  Culture, blood (routine x 2)     Status: None   Collection Time: 04/28/2017  8:42 PM  Result Value Ref Range Status   Specimen Description BLOOD RIGHT ANTECUBITAL  Final   Special Requests   Final    BOTTLES DRAWN AEROBIC AND ANAEROBIC Blood Culture results may not be optimal due to an excessive volume of blood received in culture bottles   Culture NO GROWTH 5 DAYS  Final   Report Status 05/02/2017 FINAL  Final  Culture, blood (routine x 2)     Status: None   Collection Time: 28-Apr-2017  8:43 PM  Result Value Ref Range Status   Specimen Description BLOOD RIGHT FOREARM  Final   Special Requests IN PEDIATRIC BOTTLE Blood Culture adequate volume  Final   Culture NO GROWTH 5 DAYS  Final   Report Status 05/02/2017 FINAL  Final  Fungus Culture With Stain     Status: None (Preliminary result)   Collection Time: 04/28/17 12:14 PM  Result Value Ref Range Status   Fungus Stain Final report  Final    Comment: (NOTE) Performed At: [facility] [address] [place], [place] [id number] Mila Homer MD WJ:[id number]    Fungus (Mycology) Culture PENDING  Incomplete   Fungal Source FLUID  Final    Comment: PLEURAL RIGHT   Culture, body fluid-bottle     Status: Abnormal (Preliminary result)   Collection Time: 04/28/17 12:14 PM  Result Value Ref Range Status   Specimen Description PLEURAL RIGHT  Final   Special Requests HOLD FOR ACTINOMYCES  Final    Gram Stain   Final    GRAM POSITIVE COCCI IN CLUSTERS IN BOTH AEROBIC AND ANAEROBIC BOTTLES CRITICAL RESULT CALLED TO, READ BACK BY AND VERIFIED WITH: Sharyn Dross RN 11:55 04/29/17 ([email or web address])    Culture (  A)  Final    STAPHYLOCOCCUS EPIDERMIDIS CONTINUING TO HOLD FOR 14 DAYS NO ANAEROBES ISOLATED    Report Status PENDING  Incomplete   Organism ID, Bacteria STAPHYLOCOCCUS EPIDERMIDIS  Final      Susceptibility   Staphylococcus epidermidis - MIC*    CIPROFLOXACIN >=8 RESISTANT Resistant     ERYTHROMYCIN <=0.25 SENSITIVE Sensitive     GENTAMICIN <=0.5 SENSITIVE Sensitive     OXACILLIN >=4 RESISTANT Resistant     TETRACYCLINE <=1 SENSITIVE Sensitive     VANCOMYCIN 1 SENSITIVE Sensitive     TRIMETH/SULFA >=320 RESISTANT Resistant     CLINDAMYCIN <=0.25 SENSITIVE Sensitive     RIFAMPIN <=0.5 SENSITIVE Sensitive     Inducible Clindamycin NEGATIVE Sensitive     * STAPHYLOCOCCUS EPIDERMIDIS  Gram stain     Status: None   Collection Time: 04/28/17 12:14 PM  Result Value Ref Range Status   Specimen Description PLEURAL RIGHT  Final   Special Requests NONE  Final   Gram Stain   Final    ABUNDANT WBC PRESENT, PREDOMINANTLY PMN NO ORGANISMS SEEN    Report Status 04/28/2017 FINAL  Final  Fungus Culture Result     Status: None   Collection Time: 04/28/17 12:14 PM  Result Value Ref Range Status   Result 1 Comment  Final    Comment: (NOTE) KOH/Calcofluor preparation:  no fungus observed. Performed At: [facility] [address] [place], [place] [id number] Mila Homer MD OZ:[id number]   MRSA PCR Screening     Status: Abnormal   Collection Time: 04/28/17 12:53 PM  Result Value Ref Range Status   MRSA by PCR POSITIVE (A) NEGATIVE Final    Comment:        The GeneXpert MRSA Assay (FDA approved for NASAL specimens only), is one component of a comprehensive MRSA colonization surveillance program. It is not intended to diagnose MRSA infection nor to guide or monitor  treatment for MRSA infections. RESULT CALLED TO, READ BACK BY AND VERIFIED WITH: Deliah Boston RN 14:40 04/28/17 ([email or web address])   Acid Fast Smear (AFB)     Status: None   Collection Time: 04/30/17  5:01 PM  Result Value Ref Range Status   AFB Specimen Processing Concentration  Final   Acid Fast Smear Negative  Final    Comment: (NOTE) Performed At: [facility] [address] [place], [place] [id number] Mila Homer MD KG:[id number]    Source (AFB) PLEURAL  Final  Culture, body fluid-bottle     Status: Abnormal   Collection Time: 05/02/17  2:39 PM  Result Value Ref Range Status   Specimen Description FLUID PLEURAL RIGHT  Final   Special Requests BOTTLES DRAWN AEROBIC AND ANAEROBIC  Final   Gram Stain   Final    GRAM POSITIVE COCCI IN BOTH AEROBIC AND ANAEROBIC BOTTLES    Culture (A)  Final    STAPHYLOCOCCUS EPIDERMIDIS SUSCEPTIBILITIES PERFORMED ON PREVIOUS CULTURE WITHIN THE LAST 5 DAYS. NO ANAEROBES ISOLATED    Report Status 05/05/2017 FINAL  Final  Gram stain     Status: None   Collection Time: 05/02/17  2:39 PM  Result Value Ref Range Status   Specimen Description FLUID PLEURAL RIGHT  Final   Special Requests NONE  Final   Gram Stain   Final    WBC PRESENT,BOTH PMN AND MONONUCLEAR GRAM POSITIVE COCCI IN PAIRS CYTOSPIN SMEAR    Report Status 05/02/2017 FINAL  Final  Culture, expectorated sputum-assessment     Status: None  Collection Time: 05/02/17  7:00 PM  Result Value Ref Range Status   Specimen Description EXPECTORATED SPUTUM  Final   Special Requests NONE  Final   Sputum evaluation THIS SPECIMEN IS ACCEPTABLE FOR SPUTUM CULTURE  Final   Report Status 05/03/2017 FINAL  Final  Culture, respiratory (NON-Expectorated)     Status: None (Preliminary result)   Collection Time: 05/02/17  7:00 PM  Result Value Ref Range Status   Specimen Description EXPECTORATED SPUTUM  Final   Special Requests NONE Reflexed from [id number]  Final   Gram Stain   Final    ABUNDANT  WBC PRESENT,BOTH PMN AND MONONUCLEAR RARE SQUAMOUS EPITHELIAL CELLS PRESENT FEW GRAM POSITIVE COCCI FEW GRAM POSITIVE RODS FEW GRAM NEGATIVE RODS    Culture   Final    FEW STAPHYLOCOCCUS AUREUS SUSCEPTIBILITIES TO FOLLOW    Report Status PENDING  Incomplete  Acid Fast Smear (AFB)     Status: None   Collection Time: 05/04/17  4:42 PM  Result Value Ref Range Status   AFB Specimen Processing Concentration  Final   Acid Fast Smear Negative  Final    Comment: (NOTE) Performed At: [facility] [address] [place], [place] [id number] Mila Homer MD WU:[id number]    Source (AFB) SPUTUM  Final    Acey Lav, MD [facility] [phone] pager    05/06/2017, 2:19 PM

## 2017-05-06 NOTE — Progress Notes (Signed)
  Date: 05/06/2017  Patient name: [name]  Medical record number: [id number]  Date of birth: [birth year]   I have seen and evaluated this patient and I have discussed the plan of care with the house staff. Please see their note for complete details. I concur with their findings with the following additions/corrections:   Looks worse today, reports feeling weak and having coughing fit this morning. Send multiple labs this morning to evaluate further possible etiologies of her fatigue, weight loss, severe malnutrition, and inability to tolerate any exertion. HIV negative, a.m. cortisol within normal limits, CK normal, CRP and ESR remains slightly elevated.  Discussed staph epi growing from pleural fluid with infectious disease. Given that it was collected from the catheter, it is more likely to be contaminant then true infection. We will stop antibiotics and watch her closely.   We still been unable to identify the etiology of her symptoms. After extensively reviewing her chart, the only loose and we have not followed up yet is her slowly enlarging renal mass. On prior MRI without contrast, it was thought to be consistent with a hemorrhagic cyst, but the evaluation was incomplete due to the lack of contrast. As her GFR is above 30, it is reasonable to pursue contrasted MRI to better evaluate this mass. If that is unrevealing, I'm afraid we may have to discharge her back home without any answers regarding her symptoms.  Oda Kilts, MD 05/06/2017, 3:40 PM

## 2017-05-06 NOTE — Care Management Important Message (Signed)
Important Message  Patient Details  Name: [name] MRN: [id number] Date of Birth: [birth year]   Medicare Important Message Given:  Yes    Villa Burgin, Annamarie Major, RN 05/06/2017, 3:21 PM

## 2017-05-07 LAB — CULTURE, RESPIRATORY W GRAM STAIN: Culture: NORMAL

## 2017-05-07 LAB — HIV ANTIBODY (ROUTINE TESTING W REFLEX): HIV SCREEN 4TH GENERATION: NONREACTIVE

## 2017-05-07 NOTE — Progress Notes (Signed)
Subjective:  Reports no changes overnight.  Continues to have fatigue and malaise, chronic and stable from baseline.  Objective:  Vital signs in last 24 hours: Vitals:   05/06/17 0456 05/06/17 0956 05/06/17 2326 05/07/17 0523  BP: 125/82 129/75 129/71 126/61  Pulse: (!) 58 76 68 62  Resp: _0 Temp: 97.8 F (36.6 C) 98 F (36.7 C) 97.6 F (36.4 C) 97.6 F (36.4 C)  TempSrc: Oral Oral Oral Oral  SpO2: 100% 100% 100% 93%  Weight:      Height:       Physical Exam  Constitutional:  Frail   Cardiovascular: Normal rate, regular rhythm and intact distal pulses.  Exam reveals no gallop and no friction rub.   No murmur heard. Pulmonary/Chest: Effort normal. No respiratory distress. She has no wheezes. She has no rales.  Abdominal: Soft. She exhibits no distension. There is no tenderness. There is no guarding.  Musculoskeletal: She exhibits no edema (of bilateral lower extremities) or tenderness (of bilateral lower extremities).  Skin: Skin is warm and dry. Capillary refill takes less than 2 seconds. No rash noted. No erythema.   Assessment/Plan:  Principal Problem:   Shortness of breath Active Problems:   History of renal transplant   Pleural effusion, bilateral   CAP (community acquired pneumonia)   Fatigue   Cough with sputum   Protein-calorie malnutrition (HCC)   Personal history of immunosupression therapy   Anorexia   Staphylococcus epidermidis infection  [name] is an 83 yo with PMH of T2DM, CKD s/p kidney transplant in 2003 on immunosuppression, diastolic CHF, and CAD who presented on 04/22/2017 with increased productive cough, worsening SOB, and progressively worsening malaise that caused inability to complete ADLs. The patient was admitted to the internal medicine teaching service for management. The specific problems addressed during this admission are as follows:  SOB, productive cough, and probable UJW:JXBJYNW continues to endorse productive cough  and pleural fluid has remained serosanguinous throughout admission. Microbiology studies reveal staph epi x 2, thought to be a contaminant as catheter is likely colonized.  Antibiotics were Dc'd 10/5. Cardiothoracic surgery consult suggested no infection and will maintain current catheter.  -Pulmonology consulted and signed off, will follow up as outpatient -CMV/EBV PCR negative, AFB negative -Vanc off 10/5 - will monitor   Progressive fatigue and anorexia: Malignancy work up, including CT of neck and chest along with MRI abdomen and pelvis, has been negative thus far, although imaging of possible renal mass incomplete 2/2 lack of contrast. Her CBC with differential is not concerning for hematologic malignancy. Her physical exam is not concerning for obvious breast or skin cancer that could be causing her symptoms. HIV negative, a.m. cortisol within normal limits, CK normal, CRP and ESR remains slightly elevated.  Patient found to have protein calorie malnutrition on admission and nutrition consultation recommended protein supplementation TID.  -Nutrition consulted, appreciate recommendations -Continue PT/OT therapy -MRI abdomen with contrast 10/5 to better evaluate possible renal mass observed on initial imaging - pending  Possible bilateral Internal Jugular DVT, unknown chronicity or provocation:Risks and benefits discussed with patient and she is agreeable to treatment as outlined below.  - 10 mg Eliquis BID (started 10/5) -Will defer duration of therapy to PCP, likely will need 5 mg BID for 3-6 months before returning to regimen of 2.5 mg BID for A-fib anticoagulation  Hyponatremia:Patient's sodium has  down trending this admission, 135 on arrival however stable at 130 for several days . Likely 2/2  decreased PO intake. Following nutrition recommendations. -BMP in the am  Fluids:None Diet: Regular diet, supplementation as per nutrition consult DVT prophylaxis:Eliquis 10 mg   Dispo:  Anticipated discharge pending clinical course.   Kalman Shan [place], DO 05/07/2017, 7:24 AM Pager: [phone]

## 2017-05-07 NOTE — Progress Notes (Signed)
Medicine attending: Current clinical status and database reviewed with resident physician Dr. Geralyn Corwin and I concur with her evaluation and management plan. Ongoing evaluation for idiopathic right pleural effusion.  Chronic pleural drainage catheter in place.  To date, no evidence for underlying malignancy.  MRI of the abdomen plan for today to reevaluate an enlarging mass in the upper pole of the left kidney.

## 2017-05-08 DIAGNOSIS — I2510 Atherosclerotic heart disease of native coronary artery without angina pectoris: Secondary | ICD-10-CM

## 2017-05-08 DIAGNOSIS — R5383 Other fatigue: Secondary | ICD-10-CM

## 2017-05-08 DIAGNOSIS — R630 Anorexia: Secondary | ICD-10-CM

## 2017-05-08 DIAGNOSIS — R0602 Shortness of breath: Secondary | ICD-10-CM

## 2017-05-08 DIAGNOSIS — I5032 Chronic diastolic (congestive) heart failure: Secondary | ICD-10-CM

## 2017-05-08 DIAGNOSIS — E46 Unspecified protein-calorie malnutrition: Secondary | ICD-10-CM

## 2017-05-08 DIAGNOSIS — Z940 Kidney transplant status: Secondary | ICD-10-CM

## 2017-05-08 DIAGNOSIS — N179 Acute kidney failure, unspecified: Secondary | ICD-10-CM

## 2017-05-08 DIAGNOSIS — N184 Chronic kidney disease, stage 4 (severe): Secondary | ICD-10-CM | POA: Diagnosis present

## 2017-05-08 LAB — BASIC METABOLIC PANEL
Anion gap: 11 (ref 5–15)
Anion gap: 10 (ref 5–15)
BUN: 25 mg/dL — ABNORMAL HIGH (ref 6–20)
BUN: 29 mg/dL — AB (ref 6–20)
CALCIUM: 10 mg/dL (ref 8.9–10.3)
CO2: 21 mmol/L — ABNORMAL LOW (ref 22–32)
CO2: 22 mmol/L (ref 22–32)
CREATININE: 1.81 mg/dL — AB (ref 0.44–1.00)
Calcium: 9.9 mg/dL (ref 8.9–10.3)
Chloride: 96 mmol/L — ABNORMAL LOW (ref 101–111)
Chloride: 98 mmol/L — ABNORMAL LOW (ref 101–111)
Creatinine, Ser: 1.69 mg/dL — ABNORMAL HIGH (ref 0.44–1.00)
GFR calc Af Amer: 31 mL/min — ABNORMAL LOW (ref >60)
GFR calc non Af Amer: 27 mL/min — ABNORMAL LOW (ref >60)
GFR, EST AFRICAN AMERICAN: 29 mL/min — AB (ref >60)
GFR, EST NON AFRICAN AMERICAN: 25 mL/min — AB (ref >60)
Glucose, Bld: 169 mg/dL — ABNORMAL HIGH (ref 65–99)
Glucose, Bld: 120 mg/dL — ABNORMAL HIGH (ref 65–99)
Potassium: 5.7 mmol/L — ABNORMAL HIGH (ref 3.5–5.1)
Potassium: 4.9 mmol/L (ref 3.5–5.1)
Sodium: 128 mmol/L — ABNORMAL LOW (ref 135–145)
Sodium: 130 mmol/L — ABNORMAL LOW (ref 135–145)

## 2017-05-08 LAB — CBC
HEMATOCRIT: 36.6 % (ref 36.0–46.0)
Hemoglobin: 12.1 g/dL (ref 12.0–15.0)
MCH: 29.2 pg (ref 26.0–34.0)
MCHC: 33.1 g/dL (ref 30.0–36.0)
MCV: 88.2 fL (ref 78.0–100.0)
PLATELETS: 321 10*3/uL (ref 150–400)
RBC: 4.15 MIL/uL (ref 3.87–5.11)
RDW: 13.7 % (ref 11.5–15.5)
WBC: 14 10*3/uL — AB (ref 4.0–10.5)

## 2017-05-08 MED ORDER — INSULIN ASPART 100 UNIT/ML ~~LOC~~ SOLN
10.0000 [IU] | Freq: Once | SUBCUTANEOUS | Status: DC
  Administered 2017-05-08: 10 [IU] via SUBCUTANEOUS

## 2017-05-08 MED ORDER — SODIUM CHLORIDE 0.9 % IV BOLUS (SEPSIS)
250.0000 mL | Freq: Once | INTRAVENOUS | Status: AC
  Administered 2017-05-08: 250 mL via INTRAVENOUS

## 2017-05-08 MED ORDER — APIXABAN 2.5 MG PO TABS
2.5000 mg | ORAL_TABLET | Freq: Two times a day (BID) | ORAL | Status: DC
  Filled 2017-05-08: qty 1

## 2017-05-08 MED ORDER — APIXABAN 2.5 MG PO TABS
2.5000 mg | ORAL_TABLET | Freq: Two times a day (BID) | ORAL | Status: DC
  Administered 2017-05-08 – 2017-05-09 (×3): 2.5 mg via ORAL
  Filled 2017-05-08 (×4): qty 1

## 2017-05-08 MED ORDER — BENZONATATE 100 MG PO CAPS
100.0000 mg | ORAL_CAPSULE | Freq: Three times a day (TID) | ORAL | Status: DC | PRN
  Administered 2017-05-08 (×2): 100 mg via ORAL
  Filled 2017-05-08 (×2): qty 1

## 2017-05-08 MED ORDER — SODIUM CHLORIDE 0.9 % IV SOLN
INTRAVENOUS | Status: AC
  Administered 2017-05-08 (×2): via INTRAVENOUS

## 2017-05-08 MED ORDER — SODIUM CHLORIDE 0.9 % IV SOLN
1.0000 g | Freq: Once | INTRAVENOUS | Status: DC
  Administered 2017-05-08: 1 g via INTRAVENOUS
  Filled 2017-05-08: qty 10

## 2017-05-08 NOTE — Progress Notes (Signed)
800 cc of red wine colored pleural fluid drained from patient's chest tube by aseptic technique procedure.Well tolerated by patient,not complaining of anything.Of note,the the chest tube catheter cap lock is broken.chest tube secured closed by means of a blue clamp.Sterile dressing applied at catheter site.

## 2017-05-08 NOTE — Progress Notes (Signed)
Whole bottle of pleural fluid send to lab for culture and sensitivity.

## 2017-05-08 NOTE — Progress Notes (Signed)
PT Cancellation Note  Patient Details Name: [name] MRN: [id number] DOB: 28-May-1932   Cancelled Treatment:    Reason Eval/Treat Not Completed: Patient declined, no reason specified Pt declined PT today and reported she "just doesn't feel good". Therapist provided education on benefits of OOB and max encouragement. Pt verbalized understanding and continued to decline. PT will continue to follow acutely.    Derek Mound, PTA Pager: [phone]   05/08/2017, 12:12 PM

## 2017-05-08 NOTE — Progress Notes (Signed)
Medicine attending: I examined this patient today together with resident physician Dr. Geralyn Corwin and I concur with her evaluation and management plan which we discussed together. No acute change.  She remains very weak and spends all of her time in bed.  MRI of the abdomen shows that the lesion in the upper pole of the left kidney is most compatible with a benign complex cyst.  Right renal transplant with no morphologic abnormalities.  Cholelithiasis without acute cholecystitis.  Incidentally noted are bilateral pleural effusions. She still has a right pleural catheter in place. Impression: 1.  Idiopathic recurrent right pleural effusion.  Cytologies negative. 2.  Failure to thrive. 3.  Status post kidney transplant now progressing to stage IV renal insufficiency.  Some of this may be related to an element of dehydration with rising BUN as well as rising creatinine. 4.  Hyperkalemia secondary to #3 She is on Prograf alone for immunosuppression.  Consider consulting nephrology for recommendations with respect to the need for additional immunosuppressive agents to salvage her transplanted kidney. 5.  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 Value of anticoagulation for this unclear.  I would keep her on low-dose Eliquis and not increase the dose especially in view of progressive renal insufficiency. Patient is very frustrated.  She states she wishes God would take her.

## 2017-05-08 NOTE — Progress Notes (Addendum)
Subjective:  States she is coughing and would like cough medication.  Continues to have fatigue and malaise, chronic and stable from baseline.    Objective:  Vital signs in last 24 hours: Vitals:   05/07/17 0900 05/07/17 1812 05/07/17 2119 05/08/17 0900  BP: 132/61 (!) 124/59 (!) 128/55 (!) 132/58  Pulse: 69 (!) 59 (!) 58 (!) 56  Resp: Temp: 97.9 F (36.6 C) 98 F (36.7 C) 97.6 F (36.4 C) 98 F (36.7 C)  TempSrc: Oral Oral Oral Oral  SpO2: 98% 96% 100% 100%  Weight:      Height:       Physical Exam  Constitutional:  Frail   Cardiovascular: Normal rate, regular rhythm and intact distal pulses.  Exam reveals no gallop and no friction rub.   No murmur heard. Pulmonary/Chest: Effort normal. No respiratory distress. She has no wheezes. She has no rales.  Abdominal: Soft. She exhibits no distension. There is no tenderness. There is no guarding.  Musculoskeletal: She exhibits no edema (of bilateral lower extremities) or tenderness (of bilateral lower extremities).  Skin: Skin is warm and dry. Capillary refill takes less than 2 seconds. No rash noted. No erythema.   Assessment/Plan:  Principal Problem:   Shortness of breath Active Problems:   History of renal transplant   Pleural effusion, bilateral   CAP (community acquired pneumonia)   Fatigue   Cough with sputum   Protein-calorie malnutrition (HCC)   Personal history of immunosupression therapy   Anorexia   Staphylococcus epidermidis infection  [name] is an 83 yo with PMH of T2DM, CKD s/p kidney transplant in 2003 on immunosuppression, diastolic CHF, and CAD who presented on 04/16/2017 with increased productive cough, worsening SOB, and progressively worsening malaise that caused inability to complete ADLs. The patient was admitted to the internal medicine teaching service for management. The specific problems addressed during this admission are as follows:  SOB, productive cough, and probable  WUJ:WJXBJYN continues to endorse productive cough and pleural fluid has remained serosanguinous throughout admission. Microbiology studies reveal staph epi x 2, thought to be a contaminant as catheter is likely colonized.  Antibiotics were Dc'd 10/5 and she has remained afebrile off anitbiotics. Cardiothoracic surgery consult suggested no infection and will maintain current catheter.   -Pulmonology consulted and signed off, will follow up as outpatient -CMV/EBV PCR negative, AFB negative -Vanc off 10/5 - will continue to monitor  -tessalon pearls  Progressive fatigue and anorexia: Malignancy work up, including CT of neck and chest along with MRI abdomen and pelvis, has been negative thus far. MR abdomen with contrast showed imaging characteristics of proteinaceous/hemorrhagic cyst.  Culture work up for sputum and pleural has been unremarkable except for staph epi in pleural fluid that is thought to be contaminant.  Her CBC with differential is not concerning for hematologic malignancy or infection.  Patient presents with a failure to thrive picture.  All work up has been unrevealing thus far.  Will need to consider palliative care discussion  -Nutrition consulted, appreciate recommendations -Continue PT/OT therapy   Possible bilateral Internal Jugular DVT, unknown chronicity or provocation:Risks and benefits discussed with patient and she is agreeable to treatment as outlined below.  - 10 mg Eliquis BID (started 10/5) -Will defer duration of therapy to PCP, likely will need 5 mg BID for 3-6 months before returning to regimen of 2.5 mg BID for A-fib anticoagulation  Hyponatremia:Patient's sodium has down trended this admission, 135 on arrival however  stable at 130 for several days . Likely 2/2 decreased PO intake. Following nutrition recommendations. -BMP in the am - IV fluids  AKI  Renal transplant patient.  Baseline creatinine around 1.35.  Today creatinine 1.8.  This is likely from  pre-renal causes due to poor oral intake.  Will give bolus and IV maintenance fluids. -IV NS  Abnormal Lab K was 5.7 this morning, repeated and was 4.9 without intervention  -bmet in am  Fluids:None Diet: Regular diet, supplementation as per nutrition consult Fluids: IV NS DVT prophylaxis:Eliquis 10 mg   Dispo: Anticipated discharge pending clinical course.   Geralyn Corwin [place], DO 05/08/2017, 10:47 AM Pager: [phone]

## 2017-05-09 DIAGNOSIS — D72829 Elevated white blood cell count, unspecified: Secondary | ICD-10-CM

## 2017-05-09 DIAGNOSIS — N2889 Other specified disorders of kidney and ureter: Secondary | ICD-10-CM

## 2017-05-09 LAB — CBC
HEMATOCRIT: 37.6 % (ref 36.0–46.0)
HEMOGLOBIN: 12.4 g/dL (ref 12.0–15.0)
MCH: 29.2 pg (ref 26.0–34.0)
MCHC: 33 g/dL (ref 30.0–36.0)
MCV: 88.7 fL (ref 78.0–100.0)
Platelets: 338 10*3/uL (ref 150–400)
RBC: 4.24 MIL/uL (ref 3.87–5.11)
RDW: 13.9 % (ref 11.5–15.5)
WBC: 15.1 10*3/uL — AB (ref 4.0–10.5)

## 2017-05-09 LAB — GRAM STAIN

## 2017-05-09 LAB — CBC WITH DIFFERENTIAL/PLATELET
Basophils Absolute: 0 10*3/uL (ref 0.0–0.1)
Basophils Relative: 0 %
Eosinophils Absolute: 0 10*3/uL (ref 0.0–0.7)
Eosinophils Relative: 0 %
Lymphocytes Relative: 4 %
Lymphs Abs: 0.7 10*3/uL (ref 0.7–4.0)
Monocytes Absolute: 1.3 10*3/uL — ABNORMAL HIGH (ref 0.1–1.0)
Monocytes Relative: 8 %
Neutro Abs: 13.4 10*3/uL — ABNORMAL HIGH (ref 1.7–7.7)
Neutrophils Relative %: 88 %

## 2017-05-09 LAB — HEPATITIS C ANTIBODY (REFLEX): HCV Ab: <0.1 s/co ratio (ref 0.0–0.9)

## 2017-05-09 LAB — BASIC METABOLIC PANEL
ANION GAP: 10 (ref 5–15)
BUN: 35 mg/dL — ABNORMAL HIGH (ref 6–20)
CHLORIDE: 99 mmol/L — AB (ref 101–111)
CO2: 21 mmol/L — AB (ref 22–32)
CREATININE: 1.77 mg/dL — AB (ref 0.44–1.00)
Calcium: 9.8 mg/dL (ref 8.9–10.3)
GFR calc non Af Amer: 25 mL/min — ABNORMAL LOW (ref >60)
GFR, EST AFRICAN AMERICAN: 29 mL/min — AB (ref >60)
Glucose, Bld: 124 mg/dL — ABNORMAL HIGH (ref 65–99)
POTASSIUM: 4.8 mmol/L (ref 3.5–5.1)
SODIUM: 130 mmol/L — AB (ref 135–145)

## 2017-05-09 LAB — ACTH: C206 ACTH: 42.1 pg/mL (ref 7.2–63.3)

## 2017-05-09 LAB — HCV COMMENT:

## 2017-05-09 NOTE — Progress Notes (Signed)
PT Cancellation Note  Patient Details Name: [name] MRN: [id number] DOB: Aug 25, 1931   Cancelled Treatment:    Reason Eval/Treat Not Completed: Other (comment)   Politely declining PT, stating that simply turning over in bed taxes her; Will hold PT and check back at a later date;   Van Clines, [place]  [facility] Pager [phone] Office [phone]    Levi Aland 05/09/2017, 3:57 PM

## 2017-05-09 NOTE — Progress Notes (Signed)
OT Cancellation    05/09/17 0800  OT Visit Information  Last OT Received On 05/09/17  Reason Eval/Treat Not Completed Fatigue/lethargy limiting ability to participate (Pt fatigued and stating that she didn't sleep last night. Pt requesting that OT return later once she has rested. Will return as schedule allows.)   Tashai Catino MSOT, OTR/L Acute Rehab Pager: [phone] Office: [phone]

## 2017-05-09 NOTE — Progress Notes (Signed)
Lab called pleural fluid growing gram positive cocci in clusters, notified Dr. Saunders Revel,

## 2017-05-09 NOTE — Progress Notes (Signed)
  Subjective: Patient examined and results of right pleural fluid microbiology reviewed Last fluid shows gram-positive cocci with multiple WBCs She had 800 mL's of fluid drawn off yesterday for nonmalignant etiology-heart failure and chronic renal insufficiency The pleural Gram stain is concerning. She probably will need removal of Pleurx catheter. Her fatigue may also be due to atrial fibrillation which appears to be of recent onset. Please hold Eliquis until after Pleurx catheter is removed Pending final culture  Objective: Vital signs in last 24 hours: Temp:  [97.4 F (36.3 C)-98.2 F (36.8 C)] 97.7 F (36.5 C) (10/08 0925) Pulse Rate:  [68-74] 74 (10/08 0925) Cardiac Rhythm: Bundle branch block;Heart block (10/08 0701) Resp:  [17-20] 18 (10/08 0925) BP: (116-130)/(49-64) 127/55 (10/08 0925) SpO2:  [88 %-94 %] 92 % (10/08 0925)  Hemodynamic parameters for last 24 hours:  Irregular heart rhythm afebrile  Intake/Output from previous day: 10/07 0701 - 10/08 0700 In: 480 [P.O.:480] Out: 250 [Urine:250] Intake/Output this shift: No intake/output data recorded.  Chronically ill Pleurx catheter site nontender Diminished breath sounds bilaterally Irregular heart rhythm  Lab Results:  Recent Labs  05/09/17 0312 05/09/17 1410  WBC 15.1* DUP SEE M7172  HGB 12.4 DUP SEE M7172  HCT 37.6 DUP SEE M7172  PLT 338 DUP SEE M7172   BMET:  Recent Labs  05/08/17 0737 05/09/17 0312  NA 130* 130*  K 4.9 4.8  CL 98* 99*  CO2 22 21*  GLUCOSE 120* 124*  BUN 29* 35*  CREATININE 1.81* 1.77*  CALCIUM 10.0 9.8    PT/INR: No results for input(s): LABPROT, INR in the last 72 hours. ABG    Component Value Date/Time   PHART 7.474 (H) 12/20/2015 1109   HCO3 18.4 (L) 12/20/2015 1109   TCO2 19.2 12/20/2015 1109   ACIDBASEDEF 4.6 (H) 12/20/2015 1109   O2SAT 94.2 12/20/2015 1109   CBG (last 3)  No results for input(s): GLUCAP in the last 72 hours.  Assessment/Plan: S/P  Will  follow-up pleural fluid culture and remove Pleurx catheter if definitely infected. The patient has had this catheter for several months and has allowed her to remain at home without frequent thoracentesis for heart failure-renal failure   LOS: 11 days    Kathlee Nations Trigt III 05/09/2017

## 2017-05-09 NOTE — Care Management Important Message (Signed)
Important Message  Patient Details  Name: [name] MRN: [id number] Date of Birth: 05-28-1932   Medicare Important Message Given:  Yes    Keziah Avis, Annamarie Major, RN 05/09/2017, 12:13 PM

## 2017-05-09 NOTE — Progress Notes (Signed)
  Date: 05/09/2017  Patient name: [name]  Medical record number: [id number]  Date of birth: [birth year]   I have seen and evaluated this patient and I have discussed the plan of care with the house staff. Please see their note for complete details. I concur with their findings with the following additions/corrections:   Over the weekend, MRI of abdomen showed renal mass is cyst and not concerning for malignancy. Creatinine has been creeping up, possibly prerenal due to poor intake (and losing a significant amount of fluid in her pleural space).   After stopping antibiotics, she now has a leukocytosis the past 2 days. No fever or other change in symptoms, but in an immunosuppressed patient with staph epi growing from multiple pleural fluid cultures, concerning for indolent infection. Reconsulted ID today, appreciate their recommendations, per their suggestion will also reconsult CT surgery to discuss replacing the catheter.  Anne Shutter, MD 05/09/2017, 10:44 PM

## 2017-05-09 NOTE — Progress Notes (Signed)
Subjective:  Patient was seen laying comfortably in bed in no acute distress. She states that her body was starting to feel better this AM and her cough was improving. Since she was feeling better, she decided to get up and walk around by herself this AM. She states is now doing terribly because she walked around her room. She continues to express frustration that she has not felt better at all during this hospitalization.   Objective:  Vital signs in last 24 hours: Vitals:   05/08/17 0900 05/08/17 1700 05/08/17 2100 05/09/17 0613  BP: (!) 132/58 (!) 128/56 130/64 (!) 116/49  Pulse: (!) 56 68 73 72  Resp: 18 18 17 20   Temp: 98 F (36.7 C) 98.2 F (36.8 C) (!) 97.4 F (36.3 C) (!) 97.5 F (36.4 C)  TempSrc: Oral Oral Oral Oral  SpO2: 100% 94% 94% (!) 88%  Weight:      Height:       Physical Exam  Constitutional:  Thin, elderly woman laying comfortably in bed in no acute distress with [place] in place  Cardiovascular: Normal rate, regular rhythm and intact distal pulses.  Exam reveals no friction rub.   No murmur heard. Pulmonary/Chest: Effort normal. No respiratory distress. She has no wheezes. She has no rales.  Abdominal: Soft. She exhibits no distension and no mass. There is no tenderness. There is no guarding.  Musculoskeletal: She exhibits no edema (of bilateral lower extremities) or tenderness (of bilateral lower extremities).  Skin: Skin is warm and dry. Capillary refill takes less than 2 seconds. No rash noted. No erythema.   Assessment/Plan:  Principal Problem:   Shortness of breath Active Problems:   History of renal transplant   Pleural effusion, bilateral   CAP (community acquired pneumonia)   Fatigue   Cough with sputum   Protein-calorie malnutrition (HCC)   Personal history of immunosupression therapy   Anorexia   Staphylococcus epidermidis infection   CKD (chronic kidney disease), symptom management only, stage 4 (severe) [facility])  [name] is an 83 yo  with PMH of T2DM, CKD s/p kidney transplant in 2003 on immunosuppression, diastolic CHF, and CAD who presented on 04/05/2017 with increased productive cough, worsening SOB, and progressively worsening malaise that caused inability to complete ADLs. The patient was admitted to the internal medicine teaching service for management. The specific problems addressed during this admission are as follows:  SOB, productive cough, and probable ZWC:HENIDPO continues to endorse productive cough and pleural fluid has remained serosanguinous throughout admission. Microbiology studies reveal staph epi x 2, thought to be a contaminant as catheter is colonized. Antibiotics were discontinued on 10/5 and patient has since developed a leukocytosis and AKI. The patient continues to endorse cough and worsening malaise. Subjectively she looks worse today than she has on previous days. Since she has clinically worsened since discontinuing antibiotics, will touch base with ID and re-consult cardiothoracic surgery regarding indwelling catheter.  -Pulmonology consulted and signed off, will follow up as outpatient -Infectious disease and cardiothoracic surgery consulted, appreciate recommendations  Progressive fatigue and anorexia: Malignancy work up, including CT of neck and chest along with MRI abdomen and pelvis, has been negative thus far and only revealed a complex cyst on native kidney. Patient's overall clinical picture consistent with failure to thrive. Will consider palliative care consult if she does not improve with above interventions to treat infection.  -Nutrition consulted, appreciate recommendations -Continue PT/OT therapy as tolerated  Possible acute on chronic kidney s/p renal transplant on  chronic immunesuppression.  Baseline creatinine while inpatient around 1.35. Over the weekend, the patient's Cr became acutely elevated to 1.8 (eGFR decreased to 29 from 40). Per discussion with [facility], her  labs during July visit prior to hospitalization showed Cr = 1.9. This is likely from a combination of pre-renal causes due to poor oral intake and increased dose of Eliquis. Trending downward to 1.77 today.  -Continue to monitor with daily BMP  Possible bilateral Internal Jugular DVT, unknown chronicity or provocation:Risks and benefits discussed with patient and was started on 10 mg Eliquis BID on 10/5 for treatment of DVT, however progressively worsening renal function on 10/7 resulted in change in therapy back to to 2.5 mg BID for A-Fib.  -Continue Eliquis 2.5 mg BID  Hyponatremia:Patient's sodium has down trended this admission, 135 on arrival however stable at 130 for several days. Likely 2/2 decreased PO intake. Following nutrition recommendations. -Continue to encourage PO intake  Fluids:None Diet: Regular diet, supplementation as per nutrition consult DVT prophylaxis:Eliquis 2.5 mg BID  Dispo: Anticipated discharge pending clinical course.  Joy Ripple, MD 05/09/2017, 6:50 AM Pager: [phone]

## 2017-05-10 ENCOUNTER — Inpatient Hospital Stay (HOSPITAL_COMMUNITY): Payer: Medicare Other

## 2017-05-10 DIAGNOSIS — I340 Nonrheumatic mitral (valve) insufficiency: Secondary | ICD-10-CM

## 2017-05-10 DIAGNOSIS — R627 Adult failure to thrive: Secondary | ICD-10-CM

## 2017-05-10 DIAGNOSIS — N281 Cyst of kidney, acquired: Secondary | ICD-10-CM

## 2017-05-10 LAB — BASIC METABOLIC PANEL WITH GFR
Anion gap: 9 (ref 5–15)
BUN: 40 mg/dL — ABNORMAL HIGH (ref 6–20)
CO2: 21 mmol/L — ABNORMAL LOW (ref 22–32)
Calcium: 9.7 mg/dL (ref 8.9–10.3)
Chloride: 99 mmol/L — ABNORMAL LOW (ref 101–111)
Creatinine, Ser: 1.7 mg/dL — ABNORMAL HIGH (ref 0.44–1.00)
GFR calc Af Amer: 31 mL/min — ABNORMAL LOW
GFR calc non Af Amer: 27 mL/min — ABNORMAL LOW
Glucose, Bld: 117 mg/dL — ABNORMAL HIGH (ref 65–99)
Potassium: 4.9 mmol/L (ref 3.5–5.1)
Sodium: 129 mmol/L — ABNORMAL LOW (ref 135–145)

## 2017-05-10 MED ORDER — FUROSEMIDE 10 MG/ML IJ SOLN
40.0000 mg | Freq: Once | INTRAMUSCULAR | Status: AC
  Administered 2017-05-10: 40 mg via INTRAVENOUS
  Filled 2017-05-10: qty 4

## 2017-05-10 MED ORDER — SODIUM CHLORIDE 0.9 % IV SOLN
300.0000 mg | Freq: Two times a day (BID) | INTRAVENOUS | Status: DC
  Administered 2017-05-10 – 2017-05-20 (×20): 300 mg via INTRAVENOUS
  Filled 2017-05-10 (×21): qty 300

## 2017-05-10 NOTE — Progress Notes (Signed)
OT Cancellation Note  Patient Details Name: [name] MRN: [id number] DOB: 1931/08/21   Cancelled Treatment:    Reason Eval/Treat Not Completed: Other Pt with other providers.  Will reattempt.  [employer], OTR/L [phone]   Jeani Hawking M 05/10/2017, 4:22 PM

## 2017-05-10 NOTE — Progress Notes (Signed)
  Echocardiogram 2D Echocardiogram has been performed.  Celene Skeen 05/10/2017, 3:02 PM

## 2017-05-10 NOTE — Progress Notes (Signed)
Procedure(s) (LRB): REMOVAL OF PLEURAL DRAINAGE CATHETER (Right) Subjective: Second culture pleural fluid through right Pleurx catheter positive for staph epi White count remains elevated at 15,000 We will plan on removing Pleurx catheter in short stay exam room with local anesthesia Wednesday midday Would consider resuming IV vancomycin If patient has evidence of recurrent right effusion from her heart failure, renal failure then a new Pleurx catheter could be placed next week if she is a candidate  Objective: Vital signs in last 24 hours: Temp:  [97.5 F (36.4 C)-98.4 F (36.9 C)] 97.7 F (36.5 C) (10/09 1239) Pulse Rate:  [72-80] 77 (10/09 1239) Cardiac Rhythm: Heart block (10/09 0700) Resp:  [18-20] 20 (10/09 1239) BP: (114-139)/(52-67) 122/56 (10/09 1239) SpO2:  [91 %-98 %] 91 % (10/09 1239)  Hemodynamic parameters for last 24 hours:  paced rhythm  Intake/Output from previous day: 10/08 0701 - 10/09 0700 In: 120 [P.O.:120] Out: 0  Intake/Output this shift: Total I/O In: 120 [P.O.:120] Out: 0   Sitting up in bed mildly dyspneic Diminished breath sounds at right base Pleurx catheter to be draining later today  Lab Results:  Recent Labs  05/09/17 0312 05/09/17 1410  WBC 15.1* DUP SEE M7172  HGB 12.4 DUP SEE M7172  HCT 37.6 DUP SEE M7172  PLT 338 DUP SEE M7172   BMET:  Recent Labs  05/09/17 0312 05/10/17 0453  NA 130* 129*  K 4.8 4.9  CL 99* 99*  CO2 21* 21*  GLUCOSE 124* 117*  BUN 35* 40*  CREATININE 1.77* 1.70*  CALCIUM 9.8 9.7    PT/INR: No results for input(s): LABPROT, INR in the last 72 hours. ABG    Component Value Date/Time   PHART 7.474 (H) 12/20/2015 1109   HCO3 18.4 (L) 12/20/2015 1109   TCO2 19.2 12/20/2015 1109   ACIDBASEDEF 4.6 (H) 12/20/2015 1109   O2SAT 94.2 12/20/2015 1109   CBG (last 3)  No results for input(s): GLUCAP in the last 72 hours.  Assessment/Plan: S/P Procedure(s) (LRB): REMOVAL OF PLEURAL DRAINAGE CATHETER  (Right) Draining Pleurx catheter Tuesday p.m. We will remove the Pleurx catheter in short stay treatment room midday Wednesday Consider resuming vancomycin for probable infection of right pleural space with staph   LOS: 12 days    Kathlee Nations Trigt III 05/10/2017

## 2017-05-10 NOTE — Progress Notes (Signed)
Subjective:  Patient was seen laying uncomfortably in her bed. She was observed to become increasingly out of breath while speaking in full sentences. She continues to endorse overall fatigue and malaise, stating that she can no longer turn in bed without feeling SOB. She cannot walk in her room to her bedroom without needing assistance, which new for her this hospitalization. She states that she last drained her pleural fluid on Sunday and got 800 mL of fluid during that time. She usually drains it 3x a week.   Objective:  Vital signs in last 24 hours: Vitals:   05/09/17 2119 05/10/17 0609 05/10/17 0900 05/10/17 1239  BP: (!) 119/55 136/63 139/67 (!) 122/56  Pulse: 76 80 72 77  Resp: Temp: 97.7 F (36.5 C) 98.4 F (36.9 C) 97.8 F (36.6 C) 97.7 F (36.5 C)  TempSrc: Oral Oral Oral Oral  SpO2: 94% 93% 98% 91%  Weight:      Height:       Physical Exam  Constitutional:  Chronically ill appearing woman laying in bed in no acute distress, but with labored breathing while speaking in full sentences. This is new from previous exams.  Cardiovascular: Normal rate, regular rhythm and intact distal pulses.  Exam reveals no friction rub.   No murmur heard. Pulmonary/Chest:  Poor effort 2/2 patient's overall fatigue and worsening clinical status. Crackles heard throughout, however breath sounds diminished compared to previous exams.  Abdominal: Soft. She exhibits no distension. There is no tenderness. There is no guarding.  Musculoskeletal: She exhibits no edema (of bilateral lower extremities) or tenderness (of bilateral lower extremitis).  Skin: Skin is warm and dry. No rash noted. No erythema.   Assessment/Plan:  Principal Problem:   Shortness of breath Active Problems:   History of renal transplant   Pleural effusion, bilateral   CAP (community acquired pneumonia)   Fatigue   Cough with sputum   Protein-calorie malnutrition (HCC)   Personal history of  immunosupression therapy   Anorexia   Staphylococcus epidermidis infection   CKD (chronic kidney disease), symptom management only, stage 4 (severe) [facility])  [name] is an 83 yo with PMH of T2DM, CKD s/p kidney transplant in 2003 on immunosuppression, diastolic CHF, and CAD who presented on 04/26/2017 with increased productive cough, worsening SOB, and progressively worsening malaise that caused inability to complete ADLs. The patient was admitted to the internal medicine teaching service for management. The specific problems addressed during this admission are as follows:  Dypnea on exertion with possible infection of pleural fluid and/or pleural catheter colonization:Patient continues to endorse productive cough and pleural fluid has remained serosanguinous throughout admission. Microbiology studies reveal staph epi x 2, thought to be a contaminant representing catheter colonization. Antibiotics were discontinued on 10/5 per ID recommendations to see if patient's clinical status was effected by catheter colonization. She has clinically worsened since discontinuation of antibiotics and consistently showing progressive fatigue, tachypnea, and SOB. CTCS consult yesterday suggested removal/replacement of catheter if definite signs of infection. Patient has clinical signs of infection, as she has clinically worsened off antibiotics and pleural fluid draining from catheter has grown staph epi x 2, we will touch base with team today to see if they would like to do thoracentesis for definitive proof of pleural fluid infection and/or removal of catheter. Will also obtain chest x ray to evaluate for pulmonary edema, pneumonia, and/or worsening of pleural fluid collections given that the patient has clinically declined over the past  few days.  -Pulmonology consulted, they suggest borderline transudative process which is consistent with what they have observed over the past year, follow up as an  outpaitent -Infectious disease and cardiothoracic surgery consulted, appreciate recommendations -Follow up chest x ray  Progressive fatigue and anorexia: Malignancy work up, including CT of neck and chest along with MRI abdomen and pelvis, has been negative thus far and only revealed a complex cyst on native kidney. Patient's overall clinical picture consistent with failure to thrive. The patient's last echo was in 01/2017 and showed grade 2 diastolic dysfunction. Worsening HFpEF could be a cause of her symptoms, so will obtain echocardiography to assess cardiac function.  -Nutrition consulted, appreciate recommendations -Encourage daily PT/OT participation, as patient is not participating in her care 2/2 fatigue -Follow up echocardiography to assess cardiac function relative to baseline echo in 01/2017  Chronic kidney disease s/p renal transplant with current immunesuppression. Baseline creatinine while inpatient around 1.35. Over the weekend, the patient's Cr became acutely elevated to 1.8, which is closer to her outpatient baseline of Cr of 1.9 per discussion with [facility] earlier in hospitalization. Cr 1.70 today. -Continue to monitor with daily BMP  Possible bilateral Internal Jugular DVT, unknown chronicity or provocation:Risks and benefits discussed with patient and was started on 10 mg Eliquis BID on 10/5 for treatment of DVT, however progressively worsening renal function on 10/7 resulted in change in therapy back to to 2.5 mg BID for chronic A-Fib anticoagulation -Holding Eliquis 2.5 mg BID 2/2   Hyponatremia:Stable over the past few days. Will continue to encourage PO intake.   Fluids:None Diet: Regular diet, supplementation as per nutrition consult DVT prophylaxis:Holding Eliquis pending CTCS surgery recommendations for possible catheter removal, will provide SCD's while in bed  Dispo: Anticipated discharge pending clinical improvement.   [name],  MD 05/10/2017, 1:06 PM Pager: [phone]

## 2017-05-10 NOTE — Progress Notes (Signed)
  Date: 05/10/2017  Patient name: [name]  Medical record number: [id number]  Date of birth: [birth year]   I have seen and evaluated this patient and I have discussed the plan of care with the house staff. Please see their note for complete details. I concur with their findings with the following additions/corrections:   Seen together on rounds this morning. Unfortunately, she looked much worse today. She was visibly short of breath just lying in bed and said she barely even have the energy to turn over in bed, and that left her winded. In fact, she was feeling winded just lying in bed today. I'm concerned by her acute change in status, and we will evaluate her with a CXR and EKG. The differential for her dyspnea is broad, but the primary concern would be worsening pleural effusions, pulmonary edema (we have been holding her Lasix that she is appeared dehydrated and has had poor oral intake), acute decompensated heart failure, pulmonary embolism (although she has been anticoagulated on request). She has not had any fever or increased cough to suggest pneumonia.  She has had very poor exercise tolerance and has been getting short of breath just getting up to the commode and back. The etiology of this dyspnea on exertion is unclear, and it would be surprising that the pleural effusion would be causing such significant symptoms. In light of her prior diagnosis of grade 2 diastolic dysfunction, I think it is worth repeating an echocardiogram to evaluate if there is been a change in her cardiac function or if she has new valvular disease that might explain her significant dyspnea.  In addition, her third pleural fluid culture of this hospitalization is now growing staph epidermidis, similar to the prior cultures. We have re-consulted CT surgery and appreciate their assistance. We will hold her other questions and work with them to evaluate the best course of action for her pleural catheter, which likely  needs to be replaced as it may be colonized with staph epidermidis. Unfortunately, her respiratory status is so poor that she may have difficulty tolerating any procedures.  Anne Shutter, MD 05/10/2017, 1:38 PM

## 2017-05-10 NOTE — Significant Event (Signed)
I personally reevaluated [name] this afternoon with our team. She remains dyspneic even at rest, with respiratory rates in the low 20s. Our evaluation today revealed an EKG that was unchanged from prior, a CXR which showed worsening bilateral pleural effusions and stable interstitial pulmonary edema. Echocardiogram is complete but not read yet and I did not have access to the images.  I also recommended to her that we evaluate her respiratory status with a blood gas, but she did not want to be stuck again, and I agreed to defer that testing for now.  With bedside ultrasound, we assessed her IVC. Her IVC measured approximately 1.8 cm, which is within the normal range, but only seemed to collapse about 25% with inspiration. This is consistent with a mildly elevated CVP of 11-15 centimeters. We have been withholding her furosemide this entire admission due to her poor intake, but given her worsening shortness of breath we will trial a dose of furosemide 40 mg IV and watch her clinical response.

## 2017-05-11 ENCOUNTER — Encounter (HOSPITAL_COMMUNITY): Admission: AD | Disposition: E | Payer: Self-pay | Source: Ambulatory Visit | Attending: Internal Medicine

## 2017-05-11 DIAGNOSIS — I272 Pulmonary hypertension, unspecified: Secondary | ICD-10-CM

## 2017-05-11 DIAGNOSIS — N184 Chronic kidney disease, stage 4 (severe): Secondary | ICD-10-CM

## 2017-05-11 DIAGNOSIS — J90 Pleural effusion, not elsewhere classified: Secondary | ICD-10-CM

## 2017-05-11 HISTORY — PX: REMOVAL OF PLEURAL DRAINAGE CATHETER: SHX5080

## 2017-05-11 LAB — CBC WITH DIFFERENTIAL/PLATELET
Basophils Absolute: 0 10*3/uL (ref 0.0–0.1)
Basophils Relative: 0 %
Eosinophils Absolute: 0 10*3/uL (ref 0.0–0.7)
Eosinophils Relative: 0 %
HCT: 36.6 % (ref 36.0–46.0)
Hemoglobin: 12.2 g/dL (ref 12.0–15.0)
Lymphocytes Relative: 9 %
Lymphs Abs: 1.1 10*3/uL (ref 0.7–4.0)
MCH: 29.5 pg (ref 26.0–34.0)
MCHC: 33.3 g/dL (ref 30.0–36.0)
MCV: 88.6 fL (ref 78.0–100.0)
Monocytes Absolute: 1 10*3/uL (ref 0.1–1.0)
Monocytes Relative: 8 %
Neutro Abs: 10.2 10*3/uL — ABNORMAL HIGH (ref 1.7–7.7)
Neutrophils Relative %: 83 %
Platelets: 307 10*3/uL (ref 150–400)
RBC: 4.13 MIL/uL (ref 3.87–5.11)
RDW: 13.9 % (ref 11.5–15.5)
WBC: 12.3 10*3/uL — ABNORMAL HIGH (ref 4.0–10.5)

## 2017-05-11 LAB — BASIC METABOLIC PANEL
Anion gap: 12 (ref 5–15)
BUN: 44 mg/dL — ABNORMAL HIGH (ref 6–20)
CO2: 19 mmol/L — ABNORMAL LOW (ref 22–32)
Calcium: 9.6 mg/dL (ref 8.9–10.3)
Chloride: 99 mmol/L — ABNORMAL LOW (ref 101–111)
Creatinine, Ser: 1.62 mg/dL — ABNORMAL HIGH (ref 0.44–1.00)
GFR calc Af Amer: 33 mL/min — ABNORMAL LOW (ref >60)
GFR calc non Af Amer: 28 mL/min — ABNORMAL LOW (ref >60)
Glucose, Bld: 120 mg/dL — ABNORMAL HIGH (ref 65–99)
Potassium: 4.5 mmol/L (ref 3.5–5.1)
Sodium: 130 mmol/L — ABNORMAL LOW (ref 135–145)

## 2017-05-11 LAB — BRAIN NATRIURETIC PEPTIDE: B Natriuretic Peptide: 729.4 pg/mL — ABNORMAL HIGH (ref 0.0–100.0)

## 2017-05-11 LAB — BLOOD GAS, VENOUS
Acid-base deficit: 3.1 mmol/L — ABNORMAL HIGH (ref 0.0–2.0)
Bicarbonate: 20.8 mmol/L (ref 20.0–28.0)
Drawn by: 511551
O2 Content: 2 L/min
O2 Saturation: 92.1 %
Patient temperature: 98.6
pCO2, Ven: 33.4 mmHg — ABNORMAL LOW (ref 44.0–60.0)
pH, Ven: 7.41 (ref 7.250–7.430)
pO2, Ven: 62.4 mmHg — ABNORMAL HIGH (ref 32.0–45.0)

## 2017-05-11 LAB — TROPONIN I
Troponin I: 0.14 ng/mL (ref <0.03)
Troponin I: 0.13 ng/mL (ref <0.03)

## 2017-05-11 LAB — ECHOCARDIOGRAM COMPLETE
Height: 63 in
Weight: 2096 oz

## 2017-05-11 SURGERY — REMOVAL, CLOSED DRAINAGE CATHETER SYSTEM, PLEURAL
Anesthesia: LOCAL | Site: Chest | Laterality: Right

## 2017-05-11 MED ORDER — LIDOCAINE HCL (PF) 1 % IJ SOLN
INTRAMUSCULAR | Status: AC
  Filled 2017-05-11: qty 30

## 2017-05-11 MED ORDER — SODIUM CHLORIDE 0.9 % IV SOLN: INTRAVENOUS | Status: DC

## 2017-05-11 MED ORDER — FUROSEMIDE 10 MG/ML IJ SOLN
40.0000 mg | Freq: Once | INTRAMUSCULAR | Status: AC
  Administered 2017-05-11: 40 mg via INTRAVENOUS
  Filled 2017-05-11: qty 4

## 2017-05-11 MED ORDER — FUROSEMIDE 10 MG/ML IJ SOLN
60.0000 mg | Freq: Once | INTRAMUSCULAR | Status: AC
  Administered 2017-05-11: 60 mg via INTRAVENOUS
  Filled 2017-05-11: qty 6

## 2017-05-11 MED ORDER — SODIUM CHLORIDE 0.9% FLUSH
3.0000 mL | Freq: Two times a day (BID) | INTRAVENOUS | Status: DC
  Administered 2017-05-11: 3 mL via INTRAVENOUS

## 2017-05-11 MED ORDER — SODIUM CHLORIDE 0.9% FLUSH: 3.0000 mL | INTRAVENOUS | Status: DC | PRN

## 2017-05-11 MED ORDER — SODIUM CHLORIDE 0.9 % IV SOLN: 250.0000 mL | INTRAVENOUS | Status: DC | PRN

## 2017-05-11 NOTE — Progress Notes (Signed)
Report called to [facility] on the floor.

## 2017-05-11 NOTE — Progress Notes (Signed)
Subjective:  Patient was seen laying across bedside table, breathing quickly and uncomfortably. Patient clinically looks worse this AM and states that last night was the worst night she's had since hospitalization. Patient able to sit up on her own but unable to participate fully with exam. Patient reports her cough continues and that her SOB has gotten so bad that she has been unable to roll in bed.  Objective:  Vital signs in last 24 hours: Vitals:   05/10/17 1239 05/10/17 1620 05/10/17 2144 05/03/2017 0526  BP: (!) 122/56 (!) 127/55 130/66 128/74  Pulse: 77 76 69 76  Resp: 20 19 20 18   Temp: 97.7 F (36.5 C) 98 F (36.7 C) 98.1 F (36.7 C) 98.6 F (37 C)  TempSrc: Oral Oral Oral Oral  SpO2: 91% 94% 95% 98%  Weight:   130 lb 8.2 oz (59.2 kg)   Height:       Physical Exam  Constitutional:  Chronically ill appearing women sitting up in bed leaning over bedside table in no acute distress.  Cardiovascular: Normal rate and regular rhythm.  Exam reveals no friction rub.   No murmur heard. Pulmonary/Chest:  Garden in place. No accessory muscle use or nasal flaring. Decreased respiratory effort. Decreased breath sounds over lower lung bases with intermittent crackles appreciated.  Abdominal: Soft. She exhibits no distension. There is no tenderness. There is no guarding.  Musculoskeletal: She exhibits no edema (of bilateral lower extremities) or tenderness (of bilateral lower extremities).  Skin: Skin is warm and dry. No rash noted. No erythema.   Assessment/Plan:  Principal Problem:   Shortness of breath Active Problems:   History of renal transplant   Pleural effusion, bilateral   CAP (community acquired pneumonia)   Fatigue   Cough with sputum   Protein-calorie malnutrition (HCC)   Personal history of immunosupression therapy   Anorexia   Staphylococcus epidermidis infection   CKD (chronic kidney disease), symptom management only, stage 4 (severe) [facility])  [name] is an  83 yo with PMH of T2DM, CKD s/p kidney transplant in 2003 on immunosuppression, diastolic CHF, and CAD who presented on 04/02/2017 with increased productive cough, worsening SOB, and progressively worsening malaise that caused inability to complete ADLs. The patient was admitted to the internal medicine teaching service for management. The specific problems addressed during this admission are as follows:  Dypnea on exertion with possible infection of pleural fluid and/or pleural catheter colonization:Patient continues to endorse productive cough and pleural fluid has remained serosanguinous throughout admission. Microbiology studies reveal staph epi x 2, thought to be a contaminant representing catheter colonization. Antibiotics were discontinued on 10/5 to see if patient's clinical status was effected by catheter colonization. She clinically worsened off antibiotics, developing worsening SOB, cough, and a leukocytosis. Ceftaroline was restarted on 10/9. CTCS plans to remove pleural catheter today. Patient will be given additional dose of IV Lasix to see if she gets relief from pleural effusions. Patient's overnight ABG showed elevated A-a gradient for her age, suggesting V/Q mismatch 2/2 PNA, CHF, and/or atelectasis. -Infectious disease and cardiothoracic surgery consulted, appreciate recommendations -Re-consult pulmonology regarding progressively worsening SOB, A-a gradient -Continue to clinically monitor after pleural catheter removal today -Continue renal dosing of ceftaroline 300 mg q12 hours -Continue IV lasix 40 mg daily for bilateral pleural effusions  Pulmonary hypertension: Echocardiography on 05/10/2017 showed grade 3 diastolic dysfunction, previously grade 2 on echocardiography on 01/2017. The patient's echocardiography also showed elevated right ventricular systolic pressure with moderate pulmonary hypertension (PA  pressure = 49 mmHg). This was not observed on previous echo. This may be 2/2 volume  overload since patient has not received home lasix 80 mg during admission as she has had worsening PO intake and low BP throughout admission. -Consult to CHF/cardiology regarding worsening of SOB -Continue IV lasix 40 mg daily as above  Progressive fatigue and anorexia: Malignancy work up, including CT of neck and chest along with MRI abdomen and pelvis, has been negative thus far and only revealed a complex cyst on native kidney. Her progressive fatigue is most likely 2/2 chronic pleural effusions (with possible underlying infection) and worsening of diastolic heart failure.  CKD stage IV s/p renal transplant with current immune-suppression: Baseline creatinine while inpatient around 1.35. Over the weekend, the patient's Cr became acutely elevated to 1.8 most likely 2/2 increased dose of Eliquis to treat possible DVTs. This has been down trending since discontinuation of increased Eliquis on 10/8 and is currently 1.62 (eGFR = 33). -Continue to monitor with daily BMP  Possible bilateral Internal Jugular DVT, unknown chronicity or provocation:Risks and benefits discussed with patient and was started on 10 mg Eliquis BID on 10/5 for treatment of DVT, however progressively worsening renal function on 10/7 resulted in change in therapy back to to 2.5 mg BID for chronic A-Fib anticoagulation. -Will restart Eliquis after pleural catheter removal.  Hyponatremia:Stable over the past few days. Will continue to encourage PO intake.   Fluids: None Diet: Heart healthy, nutrition supplementation VTE prophylaxis: SCD's while holding Eliquis Code: DNI  Dispo: Anticipated discharge pending clinical improvement  Thomasene Ripple, MD 05/31/2017, 7:35 AM Pager: [phone]

## 2017-05-11 NOTE — Consult Note (Signed)
  Advanced Heart Failure Team Consult Note   Primary Physician: Samantha Lacroce, MD Primary Cardiologist:  Dr. Hilty  Reason for Consultation: A/C diastolic CHF  HPI:    [name] is seen today for evaluation of A/C diastolic CHF / PAH at the request of Dr. Raines.   Maven W Mailloux is a 83 y.o. female with Diastolic CHF, ESRD s/p renal transplant for FSGN, chronic pleural effusions requiring Pleurx cath placement,  PAF, Chronic respiratory failure with hypoxia, gout, and CAD s/p PCI (BMS to RCA in 2002)   Pt last seen in Dr. Hilty's office 03/23/17. Pleurx cath noted to have continued output. Thought to be euvolemic on chronic lasix. She complained of NYHA III-IIIb symptoms, thought to be in line with her pleural effusions. No med changes made.   Pt admitted 04/09/2017 with progressive cough, weakness, and SOB. She stated she was so SOB she would have to stop multiple times making a cup of coffee. She has had a long history of anorexia and weakness, but no malignancy has been found on work up. She is immunosuppressed due to h/o of renal transplant. Pertinent labs on admission include K 4.0, Creatinine 1.57, WBC 7.6, Hgb 14.5. CXR 05/10/17 with Moderate size pleural effusions R>L with ? Bibasilar atelectasis or infiltrate.   Cultures of her pleural fluid have been positive for Staph epidermidis x 3 separate occasions this admission. Decision made to remove Pleurx 05/09/2017, and potentially resume the following week. BCx have been negative. She has been progressively more SOB over the past few days, prompting HF team consultation. Lasix had been on hold as patient had "appeared dehydrated". Pt did get dose of IV lasix evening of 05/10/17 and am of 05/28/2017 with minimal relief. She has received vanc and cefepime, and is currently on ceftaroline  Feeling week and tired. No SOB at rest. She is SOB at home, states she has to stop multiple times trying to fix a cup of coffee. She needs help with the  groceries and running errands, so does not get out much. Denies lightheadedness or dizziness.   Echo 05/10/17 LVEF 50-55%, Grade 3 DD, Trivial regurgitation, mild MR, Trivial PI, PA peak pressure 49 mm Hg.   Review of Systems: [y] = yes, [ ] = no   General: Weight gain [ ]; Weight loss [y]; Anorexia [y]; Fatigue [y]; Fever [ ]; Chills [y]; Weakness [y]  Cardiac: Chest pain/pressure [ ]; Resting SOB [y]; Exertional SOB [y]; Orthopnea [y]; Pedal Edema [ ]; Palpitations [ ]; Syncope [ ]; Presyncope [ ]; Paroxysmal nocturnal dyspnea[ ]  Pulmonary: Cough [ ]; Wheezing[ ]; Hemoptysis[ ]; Sputum [ ]; Snoring [ ]  GI: Vomiting[ ]; Dysphagia[ ]; Melena[ ]; Hematochezia [ ]; Heartburn[ ]; Abdominal pain [ ]; Constipation [ ]; Diarrhea [ ]; BRBPR [ ]  GU: Hematuria[ ]; Dysuria [ ]; Nocturia[ ]  Vascular: Pain in legs with walking [ ]; Pain in feet with lying flat [ ]; Non-healing sores [ ]; Stroke [ ]; TIA [ ]; Slurred speech [ ];  Neuro: Headaches[ ]; Vertigo[ ]; Seizures[ ]; Paresthesias[ ];Blurred vision [ ]; Diplopia [ ]; Vision changes [ ]  Ortho/Skin: Arthritis [y]; Joint pain [y]; Muscle pain [ ]; Joint swelling [ ]; Back Pain [ ]; Rash [ ]  Psych: Depression[ ]; Anxiety[ ]  Heme: Bleeding problems [ ]; Clotting disorders [ ]; Anemia [ ]  Endocrine: Diabetes [ ]; Thyroid dysfunction[ ]  Home Medications Prior to Admission medications     Medication Sig Start Date End Date Taking? Authorizing Provider  amLODipine (NORVASC) 10 MG tablet Take 5 mg by mouth daily. 05/21/16  Yes [provider]  apixaban (ELIQUIS) 2.5 MG TABS tablet Take 1 tablet (2.5 mg total) by mouth 2 (two) times daily. Cancel previous Eliquis prescription 07/23/16  Yes Zimmerman, Donielle M, PA-C  aspirin 81 MG chewable tablet Chew 81 mg by mouth daily.   Yes [provider]  famotidine (PEPCID) 20 MG tablet Take 20 mg by mouth 2 (two) times daily.   Yes [provider]  furosemide (LASIX) 80 MG tablet  Take 80 mg by mouth 2 (two) times daily.    Yes [provider]  metoprolol tartrate (LOPRESSOR) 50 MG tablet Take 50 mg by mouth 2 (two) times daily.  12/03/16  Yes [provider]  mycophenolate (CELLCEPT) 250 MG capsule Take 500 mg by mouth 2 (two) times daily.   Yes [provider]  PROGRAF 1 MG capsule Take 2 mg by mouth 2 (two) times daily. 01/31/17  Yes [provider]   Past Medical History: Past Medical History:  Diagnosis Date  . Candidal esophagitis (HCC)    a. 03/2014.  . Cholelithiasis   . Chronic diastolic CHF (congestive heart failure) (HCC)    a. 06/2011 Echo: EF 60-65%, no rwma, Gr1 DD, mild TR. // b. Echo 12/19/15: Severe LVH, EF 55-60%, normal wall motion, mild MR, moderate LAE, moderate TR, PASP 44 mmHg  . CKD (chronic kidney disease), stage III    a. in setting of prior ESRD and cadaveric tx in 2003.  . Coronary artery disease    a. 01/2001 Cath/PCI: LAD 50p, 50m, D1 50-60, RCA 85m (3.0x13 BX Velocity BMS).// b. NSTEMI 5/17 (demand ischemia) in setting of AF with RVR, pneumonia, CKD, a/c HF >> Myoview 12/26/15: prob inf infarct, no ischemia, EF 52%, Low Risk >> med Rx  . Dyspnea   . Gastritis    a. 03/2014  . Gout    unconfirmed by joint aspiration  . History of bacteremia    a. 08/2004 Group A Strep bacteremia.  . Hypertensive heart disease   . Pneumonia   . S/P kidney transplant    a. due to FSGN, follows with Dr. Mattingly-->Transplant in 2003. Was on HD prior to that.  . Type II diabetes mellitus (HCC)    Past Surgical History: Past Surgical History:  Procedure Laterality Date  . ABDOMINAL HYSTERECTOMY    . CHEST TUBE INSERTION Right 07/20/2016   Procedure: INSERTION RIGHT PLEURAL DRAINAGE CATHETER;  Surgeon: Peter Van Trigt, MD;  Location: [facility] OR;  Service: Thoracic;  Laterality: Right;  . ESOPHAGOGASTRODUODENOSCOPY N/A 03/20/2014   Procedure: ESOPHAGOGASTRODUODENOSCOPY (EGD);  Surgeon: Patrick D Hung, MD;  Location: [facility] ENDOSCOPY;   Service: Endoscopy;  Laterality: N/A;  . KIDNEY TRANSPLANT  2003  . VIDEO BRONCHOSCOPY Bilateral 05/12/2016   Procedure: VIDEO BRONCHOSCOPY WITHOUT FLUORO;  Surgeon: Michael B Wert, MD;  Location: [facility] END[facility]COPY;  Service: Cardiopulmonary;  Laterality: Bilateral;   Family History: Family History  Problem Relation Age of Onset  . CAD Father   . CAD Brother   . CAD Unknown        Both parents and multiple siblings have died from coronary disease prior to age 70 and several in their 40's and 50's.   Social History: Social History   Social History  . Marital status: Widowed    Spouse name: N/A  . Number of children: N/A  . Years of education:   N/A   Social History Main Topics  . Smoking status: Former Smoker    Quit date: 04/19/1969  . Smokeless tobacco: Never Used  . Alcohol use No  . Drug use: No  . Sexual activity: Not Currently   Other Topics Concern  . None   Social History Narrative   Patient's husband died in 2010.  Retired - previously delivered Rx medications.  Lives in [place] with grandchild.  Does not routinely exercise.    Allergies:  Allergies  Allergen Reactions  . Sulfa Drugs Cross Reactors Swelling   Objective:    Vital Signs:   Temp:  [97.6 F (36.4 C)-98.6 F (37 C)] 97.6 F (36.4 C) (10/10 0900) Pulse Rate:  [69-77] 71 (10/10 0900) Resp:  [18-20] 18 (10/10 0900) BP: (122-130)/(55-74) 124/58 (10/10 0900) SpO2:  [91 %-98 %] 96 % (10/10 0900) Weight:  [130 lb 8.2 oz (59.2 kg)] 130 lb 8.2 oz (59.2 kg) (10/09 2144) Last BM Date: 05/03/17  Weight change: Filed Weights   05/04/17 2043 05/10/17 2144  Weight: 131 lb (59.4 kg) 130 lb 8.2 oz (59.2 kg)    Intake/Output:   Intake/Output Summary (Last 24 hours) at 05/10/2017 1113 Last data filed at 05/30/2017 0900  Gross per 24 hour  Intake                0 ml  Output             1200 ml  Net            -1200 ml    Physical Exam    General:  Elderly and chronically ill appearing. NAD at rest.  HEENT:  normal Neck: supple. JVP to jaw. Increased superficial vascularity R>L. Carotids 2+ bilat; no bruits. No lymphadenopathy or thyromegaly appreciated. Cor: PMI nondisplaced. Regular rate & rhythm. No rubs, gallops or murmurs. Lungs: Diminished basilar sounds. Dependent crackles. Abdomen: soft, nontender, nondistended. No hepatosplenomegaly. No bruits or masses. Good bowel sounds. Extremities: no cyanosis, clubbing, or rash. No ankle edema.  Neuro: alert & orientedx3, cranial nerves grossly intact. moves all 4 extremities w/o difficulty. Affect pleasant  Telemetry   NSR 60-70s, Personally reviewed  EKG    04/29/17 Sinus brady 56 bpm, QRS 212  Labs   Basic Metabolic Panel:  Recent Labs Lab 05/08/17 0216 05/08/17 0737 05/09/17 0312 05/10/17 0453 06/01/2017 0300  NA 128* 130* 130* 129* 130*  K 5.7* 4.9 4.8 4.9 4.5  CL 96* 98* 99* 99* 99*  CO2 21* 22 21* 21* 19*  GLUCOSE 169* 120* 124* 117* 120*  BUN 25* 29* 35* 40* 44*  CREATININE 1.69* 1.81* 1.77* 1.70* 1.62*  CALCIUM 9.9 10.0 9.8 9.7 9.6   Liver Function Tests: No results for input(s): AST, ALT, ALKPHOS, BILITOT, PROT, ALBUMIN in the last 168 hours. No results for input(s): LIPASE, AMYLASE in the last 168 hours. No results for input(s): AMMONIA in the last 168 hours.  CBC:  Recent Labs Lab 05/06/17 0728 05/08/17 1254 05/09/17 0312 05/09/17 1410 05/23/2017 0300  WBC 6.1 14.0* 15.1* DUP SEE M7172 12.3*  NEUTROABS 4.1  --   --  13.4* 10.2*  HGB 12.3 12.1 12.4 DUP SEE M7172 12.2  HCT 37.2 36.6 37.6 DUP SEE M7172 36.6  MCV 88.6 88.2 88.7 DUP SEE [id number] 88.6  PLT 271 321 338 DUP SEE [id number] 307   Cardiac Enzymes:  Recent Labs Lab 05/06/17 0728  CKTOTAL 29*   BNP: BNP (last 3 results) No results for input(s): BNP in the   last 8760 hours.  ProBNP (last 3 results) No results for input(s): PROBNP in the last 8760 hours.  CBG: No results for input(s): GLUCAP in the last 168 hours.  Coagulation Studies: No results  for input(s): LABPROT, INR in the last 72 hours.  Imaging   Dg Chest Port 1 View  Result Date: 05/10/2017 CLINICAL DATA:  Shortness of breath.  History of CHF, diabetes. EXAM: PORTABLE CHEST 1 VIEW COMPARISON:  CT scan of the chest of April 28, 2017 and chest x-ray of April 27, 2017. FINDINGS: The lungs are reasonably well inflated. Bilateral pleural effusions are present slightly greater on the right. The right chest tube tip projects over the posteromedial aspect of the right 6 rib and appears stable. There is no pneumothorax. Bibasilar densities persist separate from the effusions. The cardiac silhouette remains enlarged. The central pulmonary vascularity is prominent. There is calcification in the wall of the thoracic aorta. IMPRESSION: CHF with moderate-sized bilateral pleural effusions greatest on the right. Stable positioning of the right chest tube. Bibasilar atelectasis or infiltrate. Cardiomegaly with pulmonary vascular congestion, stable. Thoracic aortic atherosclerosis. Electronically Signed   By: David  Jordan [name]   On: 05/10/2017 16:18     Medications:    Current Medications: . amLODipine  5 mg Oral Daily  . famotidine  20 mg Oral Daily  . feeding supplement  1 Container Oral TID BM  . feeding supplement (PRO-STAT SUGAR FREE 64)  30 mL Oral BID  . mycophenolate  500 mg Oral BID  . tacrolimus  2 mg Oral BID     Infusions: . ceFTAROline (TEFLARO) IV 300 mg (06/01/2017 1016)     Patient Profile   [name] is a 83 y.o. female with Diastolic CHF, ESRD s/p renal transplant for FSGN, chronic pleural effusions requiring Pleurx cath placement,  PAF, Chronic respiratory failure with hypoxia, gout, and CAD s/p PCI (BMS to RCA in 2002)   Admitted for cough, weakness and SOB. Hf team consulted 05/07/2017 for worsening SOB to look at volume status and possible PAH.   Assessment/Plan   1. Acute diastolic CHF - Volume status overloaded on exam. - Got IV lasix 40 mg this  am. Will give 60 mg IV tonight and plan for RHC in the am.  - Marked LVH on Echo. Voltage OK on EKG, less suspicion for amyloid, especially given mostly normal RV and normal LVEF.  2. Pulmonary HTN - PA pressure 49 mm Hg by Echo. ? 2/2 from h/o of dialysis.  - Will plan on RHC tomorrow afternoon. Pt agrees.  2. Chronic pleural effusion - Pleurx to be removed today due to S. Epidermidis per Dr. Van Trigt - Remains on ABX.    - Possible Pleurx replacement next week if qualifies per Dr. Van Trigt. 3. Chronic respiratory failure with hypoxia - Continue O2. Per primary.  4. AKI on CKD III s/p renal transplant for FSGN - Creatinine up to max of 1.81 this admission. Baseline 1.3 - 1.5 this admission. 5. Possible Jugular DVT - On DVT dose Eliquis originally, but with AKI 10/7 decreased back to 2.5 mg BID dosing.  - On hold currently with Pleurx removal today. Resumption timing per TCTS. 6. PAF  - Sinus brady by tele. - Eliquis on hold until s/p Pleurx cat removed.  7. CAD s/p PCI - s/p BMS to RCA in 2002. No ischemic work up since - No s/s of ischemia.   Length of Stay: 13  Michael Andrew Tillery, PA-C    05/30/2017, 11:13 AM  Advanced Heart Failure Team Pager [phone] (M-F; 7a - 4p)  Please contact [facility] for night-coverage after hours (4p -7a ) and weekends on [email or web address]  Patient seen and examined with the above-signed Advanced Practice Provider and/or Housestaff. I personally reviewed laboratory data, imaging studies and relevant notes. I independently examined the patient and formulated the important aspects of the plan. I have edited the note to reflect any of my changes or salient points. I have personally discussed the plan with the patient and/or family.  Very complicated patient with ESRD s/p renal transplant. Has had Pleurex catheter in for recurrent pleural effusions. Now c/b with staph epidermis infection in pleural fluid. On exam has evidence for volume overload and PAH.  Will proceed with RHC as part of w/u for recurrent pleural effusion. Echo reviewed personally and has severe LVH which raises concern for infiltrative process. Voltage normal on ECG so likely not AL amyloid. Can consider cMRI as renal function permits or PYP scan. If bcx grow staph epi will need TEE to assess for endocarditis.   Bensimhon, Daniel, MD  5:31 PM    

## 2017-05-11 NOTE — Progress Notes (Signed)
OT Cancellation and Discharge Note  Patient Details Name: [name] MRN: [id number] DOB: October 20, 1931   Cancelled Treatment:    Reason Eval/Treat Not Completed: Medical issues which prohibited therapy.  Continues with decr functional capacity, unable to participate, extreme fatigue; Discussed [name] case with PT as well;   Will dc OT at this point, and be happy to reconsult when she is medically improved and can participate with therapies;   PT Discussed with Internal Medicine Team, and they are in agreement;   [name] 05/19/2017, 10:41 AM  Sherryl Manges OTR/L [phone]

## 2017-05-11 NOTE — Progress Notes (Addendum)
  Date: 05/23/2017  Patient name: [name]  Medical record number: [id number]  Date of birth: 1931-08-05   I have seen and evaluated this patient and I have discussed the plan of care with the house staff. Please see their note for complete details. I concur with their findings with the following additions/corrections:   [name] continues to be very short of breath, even at rest. She reports she had a very difficult time sleeping last night, and had to sit up and lean over the table in order to catch her breath. When we saw her this morning, she appeared to be in mild respiratory distress and she appeared very fatigued. She had to pause for breaths in the middle of longer sentences. She has diminished lung sounds halfway up her back on the right side, and diminished at the base on the left with mild crackles just above that. She continues to have distended neck veins due to her jugular thromboses, and no signs of lower extremity edema.  I'm very concerned about her acute decompensation in the context of her significant gradual decline over the past number of months. It remains difficult to parse out how much of her symptoms may be due to pleural effusions versus cardiac dysfunction. My assessment he is in plantar ultrasound yesterday was that she was not significantly volume overloaded, but perhaps at the upper limit of normal, and we have held her diuresis during her entire stay due to poor intake and concern for dehydrating her. However, in the context of her worsening dyspnea and estimated CVP at the upper limits of normal, we proceeded with diuresis with 40 mg of IV Lasix yesterday. She does not seem to have improved with that, but her creatinine did not increase, and in fact it went down slightly. Echocardiogram performed yesterday showed worsening diastolic dysfunction (now grade 3) with new pulmonary hypertension.  The elevated pulmonary pressures could be from multiple possible  etiologies, including worsening left-sided diastolic heart failure, underlying pulmonary disease (although she was not thought to have significant pulmonary disease in the past), or possibly pulmonary embolism given her known jugular vein clots. We have asked pulmonology/critical care and cardiology to reconsult on her to assist [place] with assessing her hemodynamics and cardiopulmonary status. In the meantime, will give an additional dose of IV Lasix, but if she truly has worsening HFpEF, she may benefit from spironolactone, which appears to be the only medicine that has been shown to be effective. She remains in sinus rhythm with good blood pressure control, which would be the other cornerstones of management of her HFpEF. I am not sure if she may require more significant afterload reduction, if the primary driver is cardiac.  On review of her outpatient records, her pulmonologist and cardiologist both do not seem convinced that her dyspnea is primarily driven by either her pleural effusions or her diastolic dysfunction.  I appreciate involvement from CT surgery, who are planning to remove her pleural catheter today due to concern for infection. We have her on ceftaroline per recommendations of our ID consult team for coverage of staph epi. It will be instructive to see how her dyspnea response to removal of the catheter, and I'm concerned she may need it replaced in the near future as she is draining some much fluid from it every other day.  Joy Shutter, MD 06/01/2017, 11:54 AM

## 2017-05-11 NOTE — Consult Note (Signed)
PULMONARY / CRITICAL CARE MEDICINE   Name: [name] MRN: [id number] DOB: [birth year]    ADMISSION DATE:  2017/05/13 CONSULTATION DATE: 10/10   CHIEF COMPLAINT:  dyspnea  HISTORY OF PRESENT ILLNESS:  This is an 83 year old with diastolic heart failure, and a history of kidney transplant on immunosupressives, as well as a history of chronically recurring right pleural effusion for which a Pleurx catheter has been placed. We are asked to see the patient for progressive dyspnea. Of note her pleural effusion has grown staph epidermidis, and has a white count of 15,000 but also has a low protein. She has been placed on a fourth-generation cephalosporin and there are plans this afternoon to remove the Pleurx catheter. Se does not report fevers chills sweats or chest pain. She denies increase in weight or lower extremity edema. She cannot comment on orthopnea she cannot sleep on her back. She says she does have a cough productive of scant amounts of purulent sputum. Chest CT on 10/9 was really not remarkable for overt parenchymal disease. An echocardiogram performed on 10/9 showed an EF of 50-55%, severe LVH, diastolic dysfunction, and a PA pressure of 49. RV function was reported as normal. She has had Dopplers of the upper extremities on 9/30 which showed bilateral upper extremity DVTs.    PAST MEDICAL HISTORY :  She  has a past medical history of Candidal esophagitis (HCC); Cholelithiasis; Chronic diastolic CHF (congestive heart failure) (HCC); CKD (chronic kidney disease), stage III; Coronary artery disease; Dyspnea; Gastritis; Gout; History of bacteremia; Hypertensive heart disease; Pneumonia; S/P kidney transplant; and Type II diabetes mellitus (HCC).  PAST SURGICAL HISTORY: She  has a past surgical history that includes Kidney transplant (2003); Abdominal hysterectomy; Esophagogastroduodenoscopy (N/A, 03/20/2014); Video bronchoscopy (Bilateral, 05/12/2016); and Chest tube insertion (Right,  07/20/2016).  Allergies  Allergen Reactions  . Sulfa Drugs Cross Reactors Swelling    No current facility-administered medications on file prior to encounter.    Current Outpatient Prescriptions on File Prior to Encounter  Medication Sig  . amLODipine (NORVASC) 10 MG tablet Take 5 mg by mouth daily.  Marland Kitchen apixaban (ELIQUIS) 2.5 MG TABS tablet Take 1 tablet (2.5 mg total) by mouth 2 (two) times daily. Cancel previous Eliquis prescription  . famotidine (PEPCID) 20 MG tablet Take 20 mg by mouth 2 (two) times daily.  . furosemide (LASIX) 80 MG tablet Take 80 mg by mouth 2 (two) times daily.   . metoprolol tartrate (LOPRESSOR) 50 MG tablet Take 50 mg by mouth 2 (two) times daily.   . mycophenolate (CELLCEPT) 250 MG capsule Take 500 mg by mouth 2 (two) times daily.  Marland Kitchen PROGRAF 1 MG capsule Take 2 mg by mouth 2 (two) times daily.    FAMILY HISTORY:  Her indicated that the status of her father is unknown. She indicated that the status of her brother is unknown. She indicated that the status of her unknown relative is unknown.    SOCIAL HISTORY: She  reports that she quit smoking about 48 years ago. She has never used smokeless tobacco. She reports that she does not drink alcohol or use drugs.  REVIEW OF SYSTEMS:   Noncontributory  SUBJECTIVE:  As above  VITAL SIGNS: BP (!) 124/58 (BP Location: Left Arm)   Pulse 71   Temp 97.6 F (36.4 C) (Oral)   Resp 18   Ht  (1.6 m)   Wt 130 lb 8.2 oz (59.2 kg)   SpO2 96%   BMI 23.12 kg/m  HEMODYNAMICS:    VENTILATOR SETTINGS:    INTAKE / OUTPUT: I/O last 3 completed shifts: In: 120 [P.O.:120] Out: 1200 [Urine:1200]  PHYSICAL EXAMINATION: General:  This is a thin female who appears to be her 85 years able to converse and complete sentences while wearing a nasal cannula. Cardiovascular:  He does have significant JVD, but no lower extremity edema. S1 and S2 are regular without murmur rub or gallop. Lungs:  Spurgeon's are  unlabored, there are a few scattered rhonchi, no wheezes. There is symmetric air movement. Abdomen:  The abdomen is soft without any organomegaly masses tenderness guarding or rebound.  LABS:  BMET  Recent Labs Lab 05/09/17 0312 05/10/17 0453 05/03/2017 0300  NA 130* 129* 130*  K 4.8 4.9 4.5  CL 99* 99* 99*  CO2 21* 21* 19*  BUN 35* 40* 44*  CREATININE 1.77* 1.70* 1.62*  GLUCOSE 124* 117* 120*    Electrolytes  Recent Labs Lab 05/09/17 0312 05/10/17 0453 05/03/2017 0300  CALCIUM 9.8 9.7 9.6    CBC  Recent Labs Lab 05/09/17 0312 05/09/17 1410 05/21/2017 0300  WBC 15.1* DUP SEE M7172 12.3*  HGB 12.4 DUP SEE M7172 12.2  HCT 37.6 DUP SEE M7172 36.6  PLT 338 DUP SEE M7172 307    Coag's No results for input(s): APTT, INR in the last 168 hours.  Sepsis Markers No results for input(s): LATICACIDVEN, PROCALCITON, O2SATVEN in the last 168 hours.  ABG No results for input(s): PHART, PCO2ART, PO2ART in the last 168 hours.  Liver Enzymes No results for input(s): AST, ALT, ALKPHOS, BILITOT, ALBUMIN in the last 168 hours.  Cardiac Enzymes No results for input(s): TROPONINI, PROBNP in the last 168 hours.  Glucose No results for input(s): GLUCAP in the last 168 hours.  Imaging Dg Chest Port 1 View  Result Date: 05/10/2017 CLINICAL DATA:  Shortness of breath.  History of CHF, diabetes. EXAM: PORTABLE CHEST 1 VIEW COMPARISON:  CT scan of the chest of April 28, 2017 and chest x-ray of April 27, 2017. FINDINGS: The lungs are reasonably well inflated. Bilateral pleural effusions are present slightly greater on the right. The right chest tube tip projects over the posteromedial aspect of the right 6 rib and appears stable. There is no pneumothorax. Bibasilar densities persist separate from the effusions. The cardiac silhouette remains enlarged. The central pulmonary vascularity is prominent. There is calcification in the wall of the thoracic aorta. IMPRESSION: CHF with  moderate-sized bilateral pleural effusions greatest on the right. Stable positioning of the right chest tube. Bibasilar atelectasis or infiltrate. Cardiomegaly with pulmonary vascular congestion, stable. Thoracic aortic atherosclerosis. Electronically Signed   By: David  [place] [name]   On: 05/10/2017 16:18       ASSESSMENT / PLAN:  PULMONARY A: This is an 83 year old with diastolic heart failure and recurrent right pleural effusions as well as a history of immunosuppression for renal transplant with chronic dyspnea. Imaging studies do not suggest parenchymal disease. And concern that she may have thromboembolic disease especially in light of the fact that we know she has DVTs in the upper extremities and she has a mass in her kidney typically has a predilection for extension into the vena cava. I have ordered Dopplers of the lower extremities as if they are positive she will need to be treated, and I'm trying to avoid dosing with a contrast agent in this patient with marginal renal function. I Will also be awaiting removal of her Pleurx catheter later today to see if her  sense of dyspnea improves. Should all else fail we can attempt to her left-sided diastolic failure more aggressively with gentle afterload reduction with hydralazine and increase diuretics as her renal function allows.  Penny Pia, MD Pulmonary and Critical Care Medicine [facility] Pager: [phone]  05/30/2017, 12:09 PM

## 2017-05-11 NOTE — Progress Notes (Addendum)
LAQUITHA HESLIN  [birth year]  05/19/2017    Procedure: Right pleurx catheter removal  Consent: verbal and written consent obtained. The procedure was explained in detail and the patient was agreeable to proceed.   Pre-procedure: stable  Procedure: The area was prepped and draped with iodine prep sticks and dressed in a sterile fashion with 4 blue towels. A total of 25cc of 1% Lidocaine was injected into the skin throughout the procedure. The cuff was isolated and dissection was done with a hemostat and scissor. A band of scar tissue remained around the tube which was freed and the tube was removed without resistance. The patient tolerated the procedure well. One 3.0 stitch was placed. The incision was dressed with 4x4 gauze and paper tape.   Post-procedure: stable  Complications: none.  Jari Favre, PA-C

## 2017-05-11 NOTE — Progress Notes (Signed)
PT Cancellation and Discharge Note  Patient Details Name: [name] MRN: [id number] DOB: Jul 14, 1932   Cancelled Treatment:    Reason Eval/Treat Not Completed: Medical issues which prohibited therapy   Continues with decr functional capacity, unable to participate, extreme fatigue; Discussed [name]' case with OT as well;   Will dc PT at this point, and be happy to reconsult when she is medically improved and can participate with therapies;   Discussed with Internal Medicine Team, and they are in agreement;   Thank you,  Van Clines, PT  Acute Rehabilitation Services Pager [phone] Office [phone]    Levi Aland 05/13/2017, 10:28 AM

## 2017-05-12 ENCOUNTER — Encounter (HOSPITAL_COMMUNITY): Admission: AD | Disposition: E | Payer: Self-pay | Source: Ambulatory Visit | Attending: Internal Medicine

## 2017-05-12 ENCOUNTER — Inpatient Hospital Stay (HOSPITAL_COMMUNITY): Payer: Medicare Other

## 2017-05-12 ENCOUNTER — Encounter (HOSPITAL_COMMUNITY): Payer: Self-pay | Admitting: Cardiothoracic Surgery

## 2017-05-12 DIAGNOSIS — I2721 Secondary pulmonary arterial hypertension: Secondary | ICD-10-CM

## 2017-05-12 DIAGNOSIS — Z66 Do not resuscitate: Secondary | ICD-10-CM

## 2017-05-12 DIAGNOSIS — R0602 Shortness of breath: Secondary | ICD-10-CM

## 2017-05-12 DIAGNOSIS — I110 Hypertensive heart disease with heart failure: Secondary | ICD-10-CM

## 2017-05-12 HISTORY — PX: RIGHT HEART CATH: CATH118263

## 2017-05-12 LAB — CBC
HEMATOCRIT: 34.1 % — AB (ref 36.0–46.0)
HEMOGLOBIN: 11.3 g/dL — AB (ref 12.0–15.0)
MCH: 29.2 pg (ref 26.0–34.0)
MCHC: 33.1 g/dL (ref 30.0–36.0)
MCV: 88.1 fL (ref 78.0–100.0)
Platelets: 285 10*3/uL (ref 150–400)
RBC: 3.87 MIL/uL (ref 3.87–5.11)
RDW: 14.1 % (ref 11.5–15.5)
WBC: 8.7 10*3/uL (ref 4.0–10.5)

## 2017-05-12 LAB — POCT I-STAT 3, VENOUS BLOOD GAS (G3P V)
Acid-base deficit: 4 mmol/L — ABNORMAL HIGH (ref 0.0–2.0)
BICARBONATE: 21.2 mmol/L (ref 20.0–28.0)
O2 Saturation: 55 %
PCO2 VEN: 36.4 mmHg — AB (ref 44.0–60.0)
TCO2: 22 mmol/L (ref 22–32)
pH, Ven: 7.373 (ref 7.250–7.430)
pO2, Ven: 29 mmHg — CL (ref 32.0–45.0)

## 2017-05-12 LAB — BASIC METABOLIC PANEL
ANION GAP: 8 (ref 5–15)
BUN: 42 mg/dL — ABNORMAL HIGH (ref 6–20)
CALCIUM: 9 mg/dL (ref 8.9–10.3)
CHLORIDE: 102 mmol/L (ref 101–111)
CO2: 21 mmol/L — AB (ref 22–32)
Creatinine, Ser: 1.69 mg/dL — ABNORMAL HIGH (ref 0.44–1.00)
GFR calc Af Amer: 31 mL/min — ABNORMAL LOW (ref >60)
GFR calc non Af Amer: 27 mL/min — ABNORMAL LOW (ref >60)
GLUCOSE: 100 mg/dL — AB (ref 65–99)
POTASSIUM: 4.6 mmol/L (ref 3.5–5.1)
Sodium: 131 mmol/L — ABNORMAL LOW (ref 135–145)

## 2017-05-12 LAB — PROTIME-INR
INR: 1.57
Prothrombin Time: 18.7 seconds — ABNORMAL HIGH (ref 11.4–15.2)

## 2017-05-12 LAB — TROPONIN I: Troponin I: 0.11 ng/mL (ref <0.03)

## 2017-05-12 LAB — CULTURE, BODY FLUID W GRAM STAIN -BOTTLE

## 2017-05-12 LAB — CULTURE, BODY FLUID-BOTTLE

## 2017-05-12 SURGERY — RIGHT HEART CATH
Anesthesia: LOCAL

## 2017-05-12 MED ORDER — SODIUM CHLORIDE 0.9% FLUSH
3.0000 mL | Freq: Two times a day (BID) | INTRAVENOUS | Status: DC
  Administered 2017-05-13 – 2017-05-19 (×12): 3 mL via INTRAVENOUS

## 2017-05-12 MED ORDER — LIDOCAINE HCL (PF) 1 % IJ SOLN: INTRAMUSCULAR | Status: DC | PRN

## 2017-05-12 MED ORDER — LIDOCAINE HCL 2 % IJ SOLN
INTRAMUSCULAR | Status: AC
  Filled 2017-05-12: qty 10

## 2017-05-12 MED ORDER — ONDANSETRON HCL 4 MG/2ML IJ SOLN
4.0000 mg | Freq: Four times a day (QID) | INTRAMUSCULAR | Status: DC | PRN
  Administered 2017-05-16 – 2017-05-18 (×3): 4 mg via INTRAVENOUS
  Filled 2017-05-12 (×3): qty 2

## 2017-05-12 MED ORDER — IOPAMIDOL (ISOVUE-370) INJECTION 76%
INTRAVENOUS | Status: AC
  Filled 2017-05-12: qty 50

## 2017-05-12 MED ORDER — IOPAMIDOL (ISOVUE-370) INJECTION 76%
INTRAVENOUS | Status: DC | PRN
  Administered 2017-05-12: 5 mL via INTRAVENOUS

## 2017-05-12 MED ORDER — SODIUM CHLORIDE 0.9% FLUSH
3.0000 mL | INTRAVENOUS | Status: DC | PRN
  Administered 2017-05-18: 3 mL via INTRAVENOUS
  Filled 2017-05-12: qty 3

## 2017-05-12 MED ORDER — APIXABAN 2.5 MG PO TABS
2.5000 mg | ORAL_TABLET | Freq: Two times a day (BID) | ORAL | Status: DC
  Administered 2017-05-13 – 2017-05-14 (×3): 2.5 mg via ORAL
  Filled 2017-05-12 (×3): qty 1

## 2017-05-12 MED ORDER — LIDOCAINE HCL 2 % IJ SOLN
INTRAMUSCULAR | Status: DC | PRN
  Administered 2017-05-12: 5 mL

## 2017-05-12 MED ORDER — ACETAMINOPHEN 325 MG PO TABS: 650.0000 mg | ORAL_TABLET | ORAL | Status: DC | PRN

## 2017-05-12 MED ORDER — HEPARIN (PORCINE) IN NACL 2-0.9 UNIT/ML-% IJ SOLN
INTRAMUSCULAR | Status: AC
  Filled 2017-05-12: qty 500

## 2017-05-12 MED ORDER — SODIUM CHLORIDE 0.9 % IV SOLN: 250.0000 mL | INTRAVENOUS | Status: DC | PRN

## 2017-05-12 MED ORDER — FUROSEMIDE 10 MG/ML IJ SOLN
60.0000 mg | Freq: Two times a day (BID) | INTRAMUSCULAR | Status: DC
  Administered 2017-05-12 – 2017-05-13 (×2): 60 mg via INTRAVENOUS
  Filled 2017-05-12 (×2): qty 6

## 2017-05-12 MED ORDER — HEPARIN (PORCINE) IN NACL 2-0.9 UNIT/ML-% IJ SOLN
INTRAMUSCULAR | Status: AC | PRN
  Administered 2017-05-12: 500 mL

## 2017-05-12 SURGICAL SUPPLY — 9 items
CATH BALLN WEDGE 5F 110CM (CATHETERS) ×2 IMPLANT
KIT HEART LEFT (KITS) ×2 IMPLANT
PACK CARDIAC CATHETERIZATION (CUSTOM PROCEDURE TRAY) ×2 IMPLANT
SHEATH GLIDE SLENDER 4/5FR (SHEATH) ×2 IMPLANT
SYR MEDRAD MARK V 150ML (SYRINGE) ×2 IMPLANT
TRANSDUCER W/STOPCOCK (MISCELLANEOUS) ×2 IMPLANT
TUBING CIL FLEX 10 FLL-RA (TUBING) ×2 IMPLANT
WIRE ASAHI PROWATER 180CM (WIRE) ×2 IMPLANT
WIRE MAILMAN 182CM (WIRE) ×2 IMPLANT

## 2017-05-12 NOTE — Progress Notes (Addendum)
  Date: 05/23/2017  Patient name: [name]  Medical record number: [id number]  Date of birth: [birth year]   I have seen and evaluated this patient and I have discussed the plan of care with the house staff. Please see their note for complete details. I concur with their findings with the following additions/corrections:   [name] went for a right heart cath this morning. Unfortunately, they were unable to get into her right atrium due to complete occlusion at the junction of the SVC and right atrium. A venous blood gas sampled from the SVC showed a O2 sat of 55%, consistent with our thought that some of her symptoms may be due to poor cardiac output due to her diastolic dysfunction. We'll continue to discuss with our cardiology colleagues regarding the best management for this.  Her pleural catheter remains out today, and she continues on ceftaroline for the staph epi growing from her pleural cultures. My concern is that her pleural effusion will rapidly reaccumulate, and we will likely have to drain that fluid one way or another soon. She does report that her breathing felt slightly better after 1100 mL were drained out of the catheter yesterday prior to removal. As we try to optimize her dyspnea, and will likely require a combination of diuresis and pleural drainage.  We'll continue her on Lasix, currently dosed at 60 mg IV twice a day.  Her apixiban was held for her procedure yesterday and today. I originally planned to resume anticoagulation with a heparin drip, but upon visiting her this afternoon, she requested fewer lab draws, so we will resume her apixiban tomorrow. It remains unclear if there is a role for more aggressive treatment of her jugular vein clots.  We also discussed her goals of care and CODE STATUS today. She continues to be hopeful that we can find a way to make her feel better, but she is also worried that we may have done all we can. She brought up that she was  barely able to function before coming into the hospital, getting worn out by making a cup of coffee and needing two days to recover from doctor's visits. As a result, she had stopped going out, but also was not really able to care for herself at home. I assured her that we would continue to work with cardiology, pulmonology, and cardiothoracic surgery to try to find a multimodal treatment approach that will improve her symptoms, but I also was frank that I am worried she may be right that we may not be able to get her better. In light of the severity of her illness, I told her it was highly unlikely she would survive any code situation. Given that information, she elected to change her CODE STATUS to DO NOT RESUSCITATE. The EMR was updated accordingly.  Anne Shutter, MD 05/23/17, 4:31 PM

## 2017-05-12 NOTE — Progress Notes (Signed)
Advanced Heart Failure Rounding Note   Subjective:    On IV lasix. Diuresed 1 pound overnight. Denies SOB. Feels fatigued.   No evidence of DVT in BLE.   Objective:   Weight Range:  Vital Signs:   Temp:  [97.6 F (36.4 C)-98.9 F (37.2 C)] 98.6 F (37 C) (10/11 0900) Pulse Rate:  [58-74] 74 (10/11 0900) Resp:  [18-20] 18 (10/11 0900) BP: (116-128)/(52-73) 128/63 (10/11 0900) SpO2:  [96 %-98 %] 97 % (10/11 0900) Weight:  [58.8 kg (129 lb 10.1 oz)-59 kg (130 lb)] 58.8 kg (129 lb 10.1 oz) (10/10 2034) Last BM Date: 05/03/17  Weight change: Filed Weights   05/10/17 2144 05/10/2017 1252 05/10/2017 2034  Weight: 59.2 kg (130 lb 8.2 oz) 59 kg (130 lb) 58.8 kg (129 lb 10.1 oz)    Intake/Output:   Intake/Output Summary (Last 24 hours) at 2017-05-28 1108 Last data filed at 2017-05-28 0654  Gross per 24 hour  Intake              120 ml  Output             1400 ml  Net            -1280 ml     Physical Exam: General:  Elderly woman in bed. NAD. Weak appearing.  HEENT: normal Neck: supple. JVP jaw  . Carotids 2+ bilat; no bruits. No lymphadenopathy or thryomegaly appreciated. Cor: PMI nondisplaced. Regular rate & rhythm. 2/6 TR Lungs: decreased at right base  Abdomen: soft, nontender, nondistended. No hepatosplenomegaly. No bruits or masses. Good bowel sounds. Extremities: no cyanosis, clubbing, rash, trace edema Neuro: alert & orientedx3, cranial nerves grossly intact. moves all 4 extremities w/o difficulty. Affect pleasant  Telemetry: SR 70s. Personally reviewed   Labs: Basic Metabolic Panel:  Recent Labs Lab 05/08/17 0737 05/09/17 0312 05/10/17 0453 05/10/2017 0300 28-May-2017 0400  NA 130* 130* 129* 130* 131*  K 4.9 4.8 4.9 4.5 4.6  CL 98* 99* 99* 99* 102  CO2 22 21* 21* 19* 21*  GLUCOSE 120* 124* 117* 120* 100*  BUN 29* 35* 40* 44* 42*  CREATININE 1.81* 1.77* 1.70* 1.62* 1.69*  CALCIUM 10.0 9.8 9.7 9.6 9.0    Liver Function Tests: No results for input(s):  AST, ALT, ALKPHOS, BILITOT, PROT, ALBUMIN in the last 168 hours. No results for input(s): LIPASE, AMYLASE in the last 168 hours. No results for input(s): AMMONIA in the last 168 hours.  CBC:  Recent Labs Lab 05/06/17 0728 05/08/17 1254 05/09/17 0312 05/09/17 1410 05/19/2017 0300 May 28, 2017 0400  WBC 6.1 14.0* 15.1* DUP SEE M7172 12.3* 8.7  NEUTROABS 4.1  --   --  13.4* 10.2*  --   HGB 12.3 12.1 12.4 DUP SEE M7172 12.2 11.3*  HCT 37.2 36.6 37.6 DUP SEE M7172 36.6 34.1*  MCV 88.6 88.2 88.7 DUP SEE M7172 88.6 88.1  PLT 271 321 338 DUP SEE M7172 307 285    Cardiac Enzymes:  Recent Labs Lab 05/06/17 0728 05/15/2017 1446 05/13/2017 2041 05-28-2017 0400  CKTOTAL 29*  --   --   --   TROPONINI  --  0.14* 0.13* 0.11*    BNP: BNP (last 3 results)  Recent Labs  05/05/2017 1446  BNP 729.4*    ProBNP (last 3 results) No results for input(s): PROBNP in the last 8760 hours.    Other results:  Imaging: Dg Chest Port 1 View  Result Date: 05/10/2017 CLINICAL DATA:  Shortness of breath.  History  of CHF, diabetes. EXAM: PORTABLE CHEST 1 VIEW COMPARISON:  CT scan of the chest of April 28, 2017 and chest x-ray of April 27, 2017. FINDINGS: The lungs are reasonably well inflated. Bilateral pleural effusions are present slightly greater on the right. The right chest tube tip projects over the posteromedial aspect of the right 6 rib and appears stable. There is no pneumothorax. Bibasilar densities persist separate from the effusions. The cardiac silhouette remains enlarged. The central pulmonary vascularity is prominent. There is calcification in the wall of the thoracic aorta. IMPRESSION: CHF with moderate-sized bilateral pleural effusions greatest on the right. Stable positioning of the right chest tube. Bibasilar atelectasis or infiltrate. Cardiomegaly with pulmonary vascular congestion, stable. Thoracic aortic atherosclerosis. Electronically Signed   By: David  [place] [name]   On: 05/10/2017  16:18      Medications:     Scheduled Medications: . amLODipine  5 mg Oral Daily  . famotidine  20 mg Oral Daily  . feeding supplement  1 Container Oral TID BM  . feeding supplement (PRO-STAT SUGAR FREE 64)  30 mL Oral BID  . mycophenolate  500 mg Oral BID  . sodium chloride flush  3 mL Intravenous Q12H  . tacrolimus  2 mg Oral BID     Infusions: . sodium chloride    . sodium chloride    . ceFTAROline (TEFLARO) IV 300 mg (05-15-17 1034)     PRN Medications:  sodium chloride, acetaminophen **OR** acetaminophen, benzonatate, docusate sodium, magnesium hydroxide, promethazine, ramelteon, senna-docusate, sodium chloride flush   Assessment:   [name] is a 83 y.o. female with Diastolic CHF, ESRD s/p renal transplant for FSGN, chronic pleural effusions requiring Pleurx cath placement,  PAF, Chronic respiratory failure with hypoxia, gout, and CAD s/p PCI (BMS to RCA in 2002)   Admitted for cough, weakness and SOB. Hf team consulted 05/13/2017 for worsening SOB to look at volume status and possible PAH.    Plan/Discussion:    1. Acute diastolic CHF - Remains volume overloaded on exa, Only mild response to IV lasix.  - Plan RHC today to further evaluate - Continue IV lasix for now. Creatinine stable 1.6-1.7. Watch closely. - Marked LVH on Echo. Voltage OK on EKG, less suspicion for AL amyloid, especially given mostly normal RV and normal LVEF. Consider PYP scan for TTR amyloid 2. Pulmonary HTN - PA pressure 49 mm Hg by Echo. ? 2/2 from h/o of dialysis.  - RHC today 3. Chronic pleural effusion - Pleurx removed 10/10 due to S. Epidermidis per Dr. Donata Clay - Remains on ABX.    - Possible Pleurx replacement next week if re-accumulates per Dr. Donata Clay. - If [facility] turn + for stap will need TEE 4. Chronic respiratory failure with hypoxia - Continue O2. Per primary.  5. AKI on CKD III s/p renal transplant for FSGN - Creatinine up to max of 1.81 this admission. Baseline 1.3  - 1.5 this admission. Stable 1.6 today 5. Possible Jugular DVT - On DVT dose Eliquis originally, but with AKI 10/7 decreased back to 2.5 mg BID dosing.  - On hold currently with Pleurx removal today. Resumption timing per TCTS. 6. PAF  - Remains in NSR . - on Eliquis  7. CAD s/p PCI - s/p BMS to RCA in 2002. No ischemic work up since - No s/s of ischemia.  - Consider pravastatin with h/o transplant   Length of Stay: 14   Azekiel Cremer MD 15-May-2017, 11:08 AM  Advanced Heart  Failure Team Pager [phone] (M-F; 7a - 4p)  Please contact [facility] for night-coverage after hours (4p -7a ) and weekends on [email or web address]

## 2017-05-12 NOTE — Progress Notes (Signed)
ANTICOAGULATION CONSULT NOTE  Pharmacy Consult for heparin  Indication: atrial fibrillation  Allergies  Allergen Reactions  . Sulfa Drugs Cross Reactors Swelling    Patient Measurements: Height:  (160 cm) Weight: 129 lb 10.1 oz (58.8 kg) IBW/kg (Calculated) : 52.4   Vital Signs: Temp: 97.7 F (36.5 C) (10/11 1224) Temp Source: Oral (10/11 1224) BP: 128/61 (10/11 1224) Pulse Rate: 73 (10/11 1224)  Labs:  Recent Labs  05/10/17 0453 05/17/2017 0300 05/05/2017 1446 05/15/2017 2041 Jun 10, 2017 0400 2017-06-10 1024  HGB  --  12.2  --   --  11.3*  --   HCT  --  36.6  --   --  34.1*  --   PLT  --  307  --   --  285  --   LABPROT  --   --   --   --   --  18.7*  INR  --   --   --   --   --  1.57  CREATININE 1.70* 1.62*  --   --  1.69*  --   TROPONINI  --   --  0.14* 0.13* 0.11*  --     Estimated Creatinine Clearance: 20.9 mL/min (A) (by C-G formula based on SCr of 1.69 mg/dL (H)).   Medical History: Past Medical History:  Diagnosis Date  . Candidal esophagitis (HCC)    a. 03/2014.  Marland Kitchen Cholelithiasis   . Chronic diastolic CHF (congestive heart failure) (HCC)    a. 06/2011 Echo: EF 60-65%, no rwma, Gr1 DD, mild TR. // b. Echo 12/19/15: Severe LVH, EF 55-60%, normal wall motion, mild MR, moderate LAE, moderate TR, PASP 44 mmHg  . CKD (chronic kidney disease), stage III (HCC)    a. in setting of prior ESRD and cadaveric tx in 2003.  Marland Kitchen Coronary artery disease    a. 01/2001 Cath/PCI: LAD 50p, 100m, D1 50-60, RCA 105m (3.0x13 BX Velocity BMS).// b. NSTEMI 5/17 (demand ischemia) in setting of AF with RVR, pneumonia, CKD, a/c HF >> Myoview 12/26/15: prob inf infarct, no ischemia, EF 52%, Low Risk >> med Rx  . Dyspnea   . Gastritis    a. 03/2014  . Gout    unconfirmed by joint aspiration  . History of bacteremia    a. 08/2004 Group A Strep bacteremia.  . Hypertensive heart disease   . Pneumonia   . S/P kidney transplant    a. due to FSGN, follows with Dr. Vivia Birmingham in  2003. Was on HD prior to that.  . Type II diabetes mellitus (HCC)     Medications:  Prescriptions Prior to Admission  Medication Sig Dispense Refill Last Dose  . amLODipine (NORVASC) 10 MG tablet Take 5 mg by mouth daily.   04/26/2017 at Unknown time  . apixaban (ELIQUIS) 2.5 MG TABS tablet Take 1 tablet (2.5 mg total) by mouth 2 (two) times daily. Cancel previous Eliquis prescription 60 tablet 3 04/26/2017 at Unknown time  . aspirin 81 MG chewable tablet Chew 81 mg by mouth daily.   04/26/2017 at Unknown time  . famotidine (PEPCID) 20 MG tablet Take 20 mg by mouth 2 (two) times daily.   04/26/2017 at Unknown time  . furosemide (LASIX) 80 MG tablet Take 80 mg by mouth 2 (two) times daily.    04/26/2017 at Unknown time  . metoprolol tartrate (LOPRESSOR) 50 MG tablet Take 50 mg by mouth 2 (two) times daily.    04/26/2017 at 2100  . mycophenolate (CELLCEPT) 250 MG capsule  Take 500 mg by mouth 2 (two) times daily.   04/26/2017 at Unknown time  . PROGRAF 1 MG capsule Take 2 mg by mouth 2 (two) times daily.  6 04/26/2017 at Unknown time   Scheduled:  . amLODipine  5 mg Oral Daily  . famotidine  20 mg Oral Daily  . feeding supplement  1 Container Oral TID BM  . feeding supplement (PRO-STAT SUGAR FREE 64)  30 mL Oral BID  . furosemide  60 mg Intravenous BID  . mycophenolate  500 mg Oral BID  . sodium chloride flush  3 mL Intravenous Q12H  . tacrolimus  2 mg Oral BID    Assessment: 83 yo female with afib and apixaban is on hold (last dose was 2.5mg  on 10/8 at ~ 9:30pm). She is also noted s/p PleurX removal on 10/10 and RHC on 10/11 (sheath removed ~ 12pm). Noted with possible jugular DVT (Doppler studies of bilateral lower extremities are negative for DVT).  Pharmacy consulted to dose heparin for afib.   Goal of Therapy:  Heparin level 0.3-0.7 units/ml aPTT 66-102 seconds Monitor platelets by anticoagulation protocol: Yes   Plan:  -No heparin bolus with recent procedure -Will check a baseline  heparin level and aPTT with recent apixaban) -Start heparin at 6pm (~ 6 hours post sheath) at 850 units/hr -Further labs based on baseline coags  Harland German, Pharm D 22-May-2017 3:22 PM

## 2017-05-12 NOTE — H&P (View-Only) (Signed)
Advanced Heart Failure Team Consult Note   Primary Physician: Landis Martins, MD Primary Cardiologist:  Dr. Rennis Golden  Reason for Consultation: A/C diastolic CHF  HPI:    [name] is seen today for evaluation of A/C diastolic CHF / PAH at the request of Dr. Sandre Kitty.   [name] is a 83 y.o. female with Diastolic CHF, ESRD s/p renal transplant for FSGN, chronic pleural effusions requiring Pleurx cath placement,  PAF, Chronic respiratory failure with hypoxia, gout, and CAD s/p PCI (BMS to RCA in 2002)   Pt last seen in Dr. Blanchie Dessert office 03/23/17. Pleurx cath noted to have continued output. Thought to be euvolemic on chronic lasix. She complained of NYHA III-IIIb symptoms, thought to be in line with her pleural effusions. No med changes made.   Pt admitted 04/23/2017 with progressive cough, weakness, and SOB. She stated she was so SOB she would have to stop multiple times making a cup of coffee. She has had a long history of anorexia and weakness, but no malignancy has been found on work up. She is immunosuppressed due to h/o of renal transplant. Pertinent labs on admission include K 4.0, Creatinine 1.57, WBC 7.6, Hgb 14.5. CXR 05/10/17 with Moderate size pleural effusions R>L with ? Bibasilar atelectasis or infiltrate.   Cultures of her pleural fluid have been positive for Staph epidermidis x 3 separate occasions this admission. Decision made to remove Pleurx May 27, 2017, and potentially resume the following week. BCx have been negative. She has been progressively more SOB over the past few days, prompting HF team consultation. Lasix had been on hold as patient had "appeared dehydrated". Pt did get dose of IV lasix evening of 05/10/17 and am of 2017/05/27 with minimal relief. She has received vanc and cefepime, and is currently on ceftaroline  Feeling week and tired. No SOB at rest. She is SOB at home, states she has to stop multiple times trying to fix a cup of coffee. She needs help with the  groceries and running errands, so does not get out much. Denies lightheadedness or dizziness.   Echo 05/10/17 LVEF 50-55%, Grade 3 DD, Trivial regurgitation, mild MR, Trivial PI, PA peak pressure 49 mm Hg.   Review of Systems: [y] = yes,  = no   General: Weight gain ; Weight loss [y]; Anorexia [y]; Fatigue [y]; Fever ; Chills [y]; Weakness [y]  Cardiac: Chest pain/pressure ; Resting SOB [y]; Exertional SOB [y]; Orthopnea [y]; Pedal Edema ; Palpitations ; Syncope ; Presyncope ; Paroxysmal nocturnal dyspnea[ ]   Pulmonary: Cough ; Wheezing[ ] ; Hemoptysis[ ] ; Sputum ; Snoring   GI: Vomiting[ ] ; Dysphagia[ ] ; Melena[ ] ; Hematochezia ; Heartburn[ ] ; Abdominal pain ; Constipation ; Diarrhea ; BRBPR   GU: Hematuria[ ] ; Dysuria ; Nocturia[ ]   Vascular: Pain in legs with walking ; Pain in feet with lying flat ; Non-healing sores ; Stroke ; TIA ; Slurred speech ;  Neuro: Headaches[ ] ; Vertigo[ ] ; Seizures[ ] ; Paresthesias[ ] ;Blurred vision ; Diplopia ; Vision changes   Ortho/Skin: Arthritis [y]; Joint pain [y]; Muscle pain ; Joint swelling ; Back Pain ; Rash   Psych: Depression[ ] ; Anxiety[ ]   Heme: Bleeding problems ; Clotting disorders ; Anemia   Endocrine: Diabetes ; Thyroid dysfunction[ ]   Home Medications Prior to Admission medications  Medication Sig Start Date End Date Taking? Authorizing Provider  amLODipine (NORVASC) 10 MG tablet Take 5 mg by mouth daily. 05/21/16  Yes [provider]  apixaban (ELIQUIS) 2.5 MG TABS tablet Take 1 tablet (2.5 mg total) by mouth 2 (two) times daily. Cancel previous Eliquis prescription 07/23/16  Yes Doree Fudge M, PA-C  aspirin 81 MG chewable tablet Chew 81 mg by mouth daily.   Yes [provider]  famotidine (PEPCID) 20 MG tablet Take 20 mg by mouth 2 (two) times daily.   Yes [provider]  furosemide (LASIX) 80 MG tablet  Take 80 mg by mouth 2 (two) times daily.    Yes [provider]  metoprolol tartrate (LOPRESSOR) 50 MG tablet Take 50 mg by mouth 2 (two) times daily.  12/03/16  Yes [provider]  mycophenolate (CELLCEPT) 250 MG capsule Take 500 mg by mouth 2 (two) times daily.   Yes [provider]  PROGRAF 1 MG capsule Take 2 mg by mouth 2 (two) times daily. 01/31/17  Yes [provider]   Past Medical History: Past Medical History:  Diagnosis Date  . Candidal esophagitis (HCC)    a. 03/2014.  Marland Kitchen Cholelithiasis   . Chronic diastolic CHF (congestive heart failure) (HCC)    a. 06/2011 Echo: EF 60-65%, no rwma, Gr1 DD, mild TR. // b. Echo 12/19/15: Severe LVH, EF 55-60%, normal wall motion, mild MR, moderate LAE, moderate TR, PASP 44 mmHg  . CKD (chronic kidney disease), stage III    a. in setting of prior ESRD and cadaveric tx in 2003.  Marland Kitchen Coronary artery disease    a. 01/2001 Cath/PCI: LAD 50p, 26m, D1 50-60, RCA 33m (3.0x13 BX Velocity BMS).// b. NSTEMI 5/17 (demand ischemia) in setting of AF with RVR, pneumonia, CKD, a/c HF >> Myoview 12/26/15: prob inf infarct, no ischemia, EF 52%, Low Risk >> med Rx  . Dyspnea   . Gastritis    a. 03/2014  . Gout    unconfirmed by joint aspiration  . History of bacteremia    a. 08/2004 Group A Strep bacteremia.  . Hypertensive heart disease   . Pneumonia   . S/P kidney transplant    a. due to FSGN, follows with Dr. Vivia Birmingham in 2003. Was on HD prior to that.  . Type II diabetes mellitus (HCC)    Past Surgical History: Past Surgical History:  Procedure Laterality Date  . ABDOMINAL HYSTERECTOMY    . CHEST TUBE INSERTION Right 07/20/2016   Procedure: INSERTION RIGHT PLEURAL DRAINAGE CATHETER;  Surgeon: Kerin Perna, MD;  Location[facility];  Service: Thoracic;  Laterality: Right;  . ESOPHAGOGASTRODUODENOSCOPY N/A 03/20/2014   Procedure: ESOPHAGOGASTRODUODENOSCOPY (EGD);  Surgeon: Theda Belfast, MD;  Location: [facility] ENDOSCOPY;   Service: Endoscopy;  Laterality: N/A;  . KIDNEY TRANSPLANT  2003  . VIDEO BRONCHOSCOPY Bilateral 05/12/2016   Procedure: VIDEO BRONCHOSCOPY WITHOUT FLUORO;  Surgeon: Nyoka Cowden, MD;  Location: [facility] ENDOSCOPY;  Service: Cardiopulmonary;  Laterality: Bilateral;   Family History: Family History  Problem Relation Age of Onset  . CAD Father   . CAD Brother   . CAD Unknown        Both parents and multiple siblings have died from coronary disease prior to age 96 and several in their 47's and 14's.   Social History: Social History   Social History  . Marital status: Widowed    Spouse name: N/A  . Number of children: N/A  . Years of education:  N/A   Social History Main Topics  . Smoking status: Former Smoker    Quit date: 04/19/1969  . Smokeless tobacco: Never Used  . Alcohol use No  . Drug use: No  . Sexual activity: Not Currently   Other Topics Concern  . None   Social History Narrative   Patient's husband died in 12-Dec-2008.  Retired - previously delivered Rx medications.  Lives in [place] with grandchild.  Does not routinely exercise.    Allergies:  Allergies  Allergen Reactions  . Sulfa Drugs Cross Reactors Swelling   Objective:    Vital Signs:   Temp:  [97.6 F (36.4 C)-98.6 F (37 C)] 97.6 F (36.4 C) (10/10 0900) Pulse Rate:  [69-77] 71 (10/10 0900) Resp:  [18-20] 18 (10/10 0900) BP: (122-130)/(55-74) 124/58 (10/10 0900) SpO2:  [91 %-98 %] 96 % (10/10 0900) Weight:  [130 lb 8.2 oz (59.2 kg)] 130 lb 8.2 oz (59.2 kg) (10/09 12/13/2142) Last BM Date: 05/03/17  Weight change: Filed Weights   05/04/17 Dec 12, 2041 05/10/17 12/13/2142  Weight: 131 lb (59.4 kg) 130 lb 8.2 oz (59.2 kg)    Intake/Output:   Intake/Output Summary (Last 24 hours) at 05/07/2017 1113 Last data filed at 05/04/2017 0900  Gross per 24 hour  Intake                0 ml  Output             1200 ml  Net            -1200 ml    Physical Exam    General:  Elderly and chronically ill appearing. NAD at rest.  HEENT:  normal Neck: supple. JVP to jaw. Increased superficial vascularity R>L. Carotids 2+ bilat; no bruits. No lymphadenopathy or thyromegaly appreciated. Cor: PMI nondisplaced. Regular rate & rhythm. No rubs, gallops or murmurs. Lungs: Diminished basilar sounds. Dependent crackles. Abdomen: soft, nontender, nondistended. No hepatosplenomegaly. No bruits or masses. Good bowel sounds. Extremities: no cyanosis, clubbing, or rash. No ankle edema.  Neuro: alert & orientedx3, cranial nerves grossly intact. moves all 4 extremities w/o difficulty. Affect pleasant  Telemetry   NSR 60-70s, Personally reviewed  EKG    04/29/17 Sinus brady 56 bpm, QRS 212  Labs   Basic Metabolic Panel:  Recent Labs Lab 05/08/17 0216 05/08/17 0737 05/09/17 0312 05/10/17 0453 05/17/2017 0300  NA 128* 130* 130* 129* 130*  K 5.7* 4.9 4.8 4.9 4.5  CL 96* 98* 99* 99* 99*  CO2 21* 22 21* 21* 19*  GLUCOSE 169* 120* 124* 117* 120*  BUN 25* 29* 35* 40* 44*  CREATININE 1.69* 1.81* 1.77* 1.70* 1.62*  CALCIUM 9.9 10.0 9.8 9.7 9.6   Liver Function Tests: No results for input(s): AST, ALT, ALKPHOS, BILITOT, PROT, ALBUMIN in the last 168 hours. No results for input(s): LIPASE, AMYLASE in the last 168 hours. No results for input(s): AMMONIA in the last 168 hours.  CBC:  Recent Labs Lab 05/06/17 0728 05/08/17 1254 05/09/17 0312 05/09/17 1410 05/09/2017 0300  WBC 6.1 14.0* 15.1* DUP SEE [id number] 12.3*  NEUTROABS 4.1  --   --  13.4* 10.2*  HGB 12.3 12.1 12.4 DUP SEE [id number] 12.2  HCT 37.2 36.6 37.6 DUP SEE [id number] 36.6  MCV 88.6 88.2 88.7 DUP SEE [id number] 88.6  PLT 271 321 338 DUP SEE [id number] 307   Cardiac Enzymes:  Recent Labs Lab 05/06/17 0728  CKTOTAL 29*   BNP: BNP (last 3 results) No results for input(s): BNP in the  last 8760 hours.  ProBNP (last 3 results) No results for input(s): PROBNP in the last 8760 hours.  CBG: No results for input(s): GLUCAP in the last 168 hours.  Coagulation Studies: No results  for input(s): LABPROT, INR in the last 72 hours.  Imaging   Dg Chest Port 1 View  Result Date: 05/10/2017 CLINICAL DATA:  Shortness of breath.  History of CHF, diabetes. EXAM: PORTABLE CHEST 1 VIEW COMPARISON:  CT scan of the chest of April 28, 2017 and chest x-ray of April 27, 2017. FINDINGS: The lungs are reasonably well inflated. Bilateral pleural effusions are present slightly greater on the right. The right chest tube tip projects over the posteromedial aspect of the right 6 rib and appears stable. There is no pneumothorax. Bibasilar densities persist separate from the effusions. The cardiac silhouette remains enlarged. The central pulmonary vascularity is prominent. There is calcification in the wall of the thoracic aorta. IMPRESSION: CHF with moderate-sized bilateral pleural effusions greatest on the right. Stable positioning of the right chest tube. Bibasilar atelectasis or infiltrate. Cardiomegaly with pulmonary vascular congestion, stable. Thoracic aortic atherosclerosis. Electronically Signed   By: David  [place] [name]   On: 05/10/2017 16:18     Medications:    Current Medications: . amLODipine  5 mg Oral Daily  . famotidine  20 mg Oral Daily  . feeding supplement  1 Container Oral TID BM  . feeding supplement (PRO-STAT SUGAR FREE 64)  30 mL Oral BID  . mycophenolate  500 mg Oral BID  . tacrolimus  2 mg Oral BID     Infusions: . ceFTAROline (TEFLARO) IV 300 mg (05/22/2017 1016)     Patient Profile   [name] is a 83 y.o. female with Diastolic CHF, ESRD s/p renal transplant for FSGN, chronic pleural effusions requiring Pleurx cath placement,  PAF, Chronic respiratory failure with hypoxia, gout, and CAD s/p PCI (BMS to RCA in 2002)   Admitted for cough, weakness and SOB. Hf team consulted 05/05/2017 for worsening SOB to look at volume status and possible PAH.   Assessment/Plan   1. Acute diastolic CHF - Volume status overloaded on exam. - Got IV lasix 40 mg this  am. Will give 60 mg IV tonight and plan for RHC in the am.  - Marked LVH on Echo. Voltage OK on EKG, less suspicion for amyloid, especially given mostly normal RV and normal LVEF.  2. Pulmonary HTN - PA pressure 49 mm Hg by Echo. ? 2/2 from h/o of dialysis.  - Will plan on RHC tomorrow afternoon. Pt agrees.  2. Chronic pleural effusion - Pleurx to be removed today due to S. Epidermidis per Dr. Donata Clay - Remains on ABX.    - Possible Pleurx replacement next week if qualifies per Dr. Donata Clay. 3. Chronic respiratory failure with hypoxia - Continue O2. Per primary.  4. AKI on CKD III s/p renal transplant for FSGN - Creatinine up to max of 1.81 this admission. Baseline 1.3 - 1.5 this admission. 5. Possible Jugular DVT - On DVT dose Eliquis originally, but with AKI 10/7 decreased back to 2.5 mg BID dosing.  - On hold currently with Pleurx removal today. Resumption timing per TCTS. 6. PAF  - Sinus brady by tele. - Eliquis on hold until s/p Pleurx cat removed.  7. CAD s/p PCI - s/p BMS to RCA in 2002. No ischemic work up since - No s/s of ischemia.   Length of Stay: [address] [place], [place]  06/01/2017, 11:13 AM  Advanced Heart Failure Team Pager [phone] (M-F; 7a - 4p)  Please contact [facility] for night-coverage after hours (4p -7a ) and weekends on [email or web address]  Patient seen and examined with the above-signed Advanced Practice Provider and/or Housestaff. I personally reviewed laboratory data, imaging studies and relevant notes. I independently examined the patient and formulated the important aspects of the plan. I have edited the note to reflect any of my changes or salient points. I have personally discussed the plan with the patient and/or family.  Very complicated patient with ESRD s/p renal transplant. Has had Pleurex catheter in for recurrent pleural effusions. Now c/b with staph epidermis infection in pleural fluid. On exam has evidence for volume overload and PAH.  Will proceed with RHC as part of w/u for recurrent pleural effusion. Echo reviewed personally and has severe LVH which raises concern for infiltrative process. Voltage normal on ECG so likely not AL amyloid. Can consider cMRI as renal function permits or PYP scan. If bcx grow staph epi will need TEE to assess for endocarditis.   Arvilla Meres, MD  5:31 PM

## 2017-05-12 NOTE — Progress Notes (Addendum)
Subjective: Patient continues to endorse general fatigue, which has worsened overnight. Her SOB at rest has slightly improved from yesterday, however she continues to endorse significant SOB while moving around her bed and room. She states that if she just lays still in bed she feels good but otherwise does not feel like she can move on her own.  Objective:  Vital signs in last 24 hours: Vitals:   May 17, 2017 1153 05/17/2017 1158 2017-05-17 1203 May 17, 2017 1224  BP: 130/68 126/66 (!) 119/59 128/61  Pulse: 75 81 94 73  Resp: Temp:    97.7 F (36.5 C)  TempSrc:    Oral  SpO2: 97% 99% 98% 96%  Weight:      Height:       Physical Exam  Constitutional:  Thin, elderly woman sitting in bed in no acute distress  Cardiovascular: Normal rate and regular rhythm.  Exam reveals no friction rub.   No murmur heard. Pulmonary/Chest:  Patient laying in bed with [place] in place in no acute distress. No accessory muscle use or nasal flaring. Crackles in both lung bases without wheezing.   Abdominal: Soft. She exhibits no distension and no mass. There is no tenderness. There is no guarding.  Musculoskeletal: She exhibits no edema (of bialteral LEs) or tenderness (of bilateral LEs).  Skin: Skin is warm and dry. No rash noted. No erythema.   Assessment/Plan:  Principal Problem:   Shortness of breath Active Problems:   History of renal transplant   Pleural effusion, bilateral   CAP (community acquired pneumonia)   Fatigue   Cough with sputum   Protein-calorie malnutrition (HCC)   Personal history of immunosupression therapy   Anorexia   Staphylococcus epidermidis infection   CKD (chronic kidney disease), symptom management only, stage 4 (severe) [facility])  [name] is an 83 yo with PMH of T2DM, CKD s/p kidney transplant in 2003 on immunosuppression, diastolic CHF, and CAD who presented on 04/07/2017 with increased productive cough, worsening SOB, and progressively worsening malaise that caused  inability to complete ADLs. The patient was admitted to the internal medicine teaching service for management. The specific problems addressed during this admission are as follows:  SOB with exertion with possible infection of pleural fluid and/or pleural catheter colonization:Patient had pleural catheter removed with CTCS yesterday. Patient states that she has had interval improvement in her breathing after the procedure, but continues to inability to roll in bed without becoming SOB. Her crackles on exam are worse than previous examinations today, however her leukocytosis has resolved with reinitiating antibiotic therapy. Will continue to monitor now that drain has been removed to see if patients symptoms improve.  -Infectious disease and cardiothoracic surgery consulted, appreciate recommendations -Continue renal dosing of ceftaroline 300 mg q12 hours  Acute on chronic HFpEF: Patient's echocardiography on 05/10/2017 showed grade 3 diastolic dysfunction, which represents worsening of grade 2 dysfunction previously observed on echocardiography on 01/2017. Patient's home lasix 80 mg was held on admission, as admitting team noted was concerned for dehydration in the setting of decreased PO intake and overall fatigue, which has continued throughout the hospitalization. The patient was restarted on aggressive IV diuresis yesterday for acute CHF exacerbation and had -1.3L out in 24 hours. Patient does not notice a change in her breathing status with diuresis, but subjectively looks better today as she does not seem as SOB while speaking in full sentences as she did yesterday. -Continue strict I&Os  -Continue IV Lasix 60 mg BID  Pulmonary hypertension:  The patient's echocardiography also showed elevated right ventricular systolic pressure with moderate pulmonary hypertension (PA pressure = 49 mmHg). Right heart catheterization on 05/11/2017 was unable to be performed 2/2 multiple clots.   CKD stage IVs/p  renal transplant with current immune suppression: Cr has been stable around 1.6 for the past few days. -Continue to monitor with daily BMP while on antibiotic therapy  Progressive fatigue and anorexia: Malignancy work up, including CT of neck and chest along with MRI abdomen and pelvis, has been negative thus far and only revealed a complex cyst on native kidney. Her progressive fatigue is most likely 2/2 chronic pleural effusions (with possible underlying infection) and worsening of diastolic heart failure.  Paroxysmal ZO:XWRU controlled with metoprolol 50 mg BID at home and on Eliquis 2.5 mg BID for chronic anticoagulation. Patient on telemetry and no episodes of A-Fib observed on telemetry/EKG throughout admission. Holding home metoprolol 50 mg BID during acute CHF exacerbation.  -Eliquis 2.5mg  BID starting tomorrow -Continue telemetry  Hyponatremia:Stable over the past few days. Will continue to encourage PO intake.   Hypertension: Holding home amlodipine 5 mg daily as patient has been normotensive during admission.  Fluids: None Diet: Heart healthy, nutrition supplementation VTE prophylaxis: SCD's, per patient requiest Code: DNI  Dispo: Anticipated discharge pending clinical improvement.   [name], MD 05/23/2017, 1:08 PM Pager: [phone]  Addendum: Per discussion with patient, she does not want frequent lab draws and needs a break. Will start Eliquis tomorrow for anticoagulation rather than continuing Heparin per previous plan. Will continue SCD's until Eliquis resumed tomorrow.

## 2017-05-12 NOTE — Interval H&P Note (Signed)
History and Physical Interval Note:  05/18/2017 11:04 AM  [name]  has presented today for surgery, with the diagnosis of pulmonary hypertension  The various methods of treatment have been discussed with the patient and family. After consideration of risks, benefits and other options for treatment, the patient has consented to  Procedure(s): RIGHT HEART CATH (N/A) as a surgical intervention .  The patient's history has been reviewed, patient examined, no change in status, stable for surgery.  I have reviewed the patient's chart and labs.  Questions were answered to the patient's satisfaction.     Bensimhon, Reuel Boom

## 2017-05-12 NOTE — Progress Notes (Signed)
VASCULAR LAB PRELIMINARY  PRELIMINARY  PRELIMINARY  PRELIMINARY  Bilateral lower extremity venous duplex completed.    Preliminary report:  There is no DVT or SVT noted in the bilateral lower extremities.   Ademide Schaberg, RVT 05/04/2017, 9:27 AM

## 2017-05-13 ENCOUNTER — Inpatient Hospital Stay (HOSPITAL_COMMUNITY): Payer: Medicare Other

## 2017-05-13 ENCOUNTER — Encounter (HOSPITAL_COMMUNITY): Payer: Self-pay | Admitting: Internal Medicine

## 2017-05-13 DIAGNOSIS — I5033 Acute on chronic diastolic (congestive) heart failure: Secondary | ICD-10-CM

## 2017-05-13 MED ORDER — FUROSEMIDE 80 MG PO TABS
80.0000 mg | ORAL_TABLET | Freq: Two times a day (BID) | ORAL | Status: DC
  Administered 2017-05-14 – 2017-05-15 (×4): 80 mg via ORAL
  Filled 2017-05-13 (×4): qty 1

## 2017-05-13 NOTE — Care Management Important Message (Signed)
Important Message  Patient Details  Name: [name] MRN: [id number] Date of Birth: [birth year]   Medicare Important Message Given:  Yes    Yuki Brunsman, Annamarie Major, RN 05/13/2017, 9:59 AM

## 2017-05-13 NOTE — Progress Notes (Signed)
  Date: 05/13/2017  Patient name: [name]  Medical record number: [id number]  Date of birth: 11-08-1931   I have seen and evaluated this patient and I have discussed the plan of care with the house staff. Please see their note for complete details. I concur with their findings with the following additions/corrections:   See together on rounds with the team this morning. She continues to look extremely fatigued and gets out of breath with any movement, including rolling over in bed. She has diminished breath sounds from the right lung base nearly up to the shoulder, with increased crackles on the left side.  We reviewed her recent results in the recommendations from our consulting teams. I expressed my concern that there may be little more we can do for her to get her feeling better. It is looking more and more likely that she will continue to decline, despite our best efforts. I appreciate the heart failure team seeing her, and they feel they have nothing more to offer in terms of therapies for her.  I spoke with CT surgery as well regarding her pleural catheter, which was removed 2 days ago after multiple pleural fluid cultures grew staph epi. We've continued her on ceftaroline per infectious disease recommendations for presumed staph epi infection of the pleural fluid and pleural catheter. CT surgery is planning to replace the tube next week, if indicated. I am concerned that over the weekend, she may accumulate significant amounts of pleural effusions, and she may require drainage before then. CT surgery recommended thoracentesis over the weekend if needed until the pleural catheter can be replaced. Given that she has significant pleural effusions bilaterally at this point, I'm not sure if there is utility in trying to drain both sides.  Pulmonology saw her earlier this week and recommended consideration of PE, as well as consideration of using hydralazine for afterload reduction as a last  ditch effort to improve her HFpEF. Considering we have very little else to offer her in the way of treatments that may relieve some of her symptoms, this may be worth a try in the near future.  Given her ongoing decline, I discussed with her both individually this morning, and later in the evening with her daughter, my recommendation to involve palliative care. She remains resistant to that idea and is not ready to "give up". I provided reassurance that involvement of palliative care is not synonymous with giving up, and that we will continue to treat her as we involve them in her care. But, I also emphasized that if we cannot reverse her trajectory, it is likely that she will continue to decline rapidly over the next weeks to months.  Anne Shutter, MD 05/13/2017, 8:34 PM

## 2017-05-13 NOTE — Progress Notes (Signed)
Advanced Heart Failure Rounding Note   Subjective:     RHC cath yesterday with SVC pressure 10 and Fick Co/CI 3.24/2.0 (using SVC sat) unable to complete full RHC due to SVC stenosis (and AVF in both groins)   Remains on IV lasix.  Still feels weak. Denies orthopnea. Dyspneic with any exertion.  Remains on IV lasix. I/Os inaccurate.   Had bed weight last night 129#. Reweighed personally this am 130.7  CXR still with moderate R effusion small left effusion  Objective:   Weight Range:  Vital Signs:   Temp:  [97.4 F (36.3 C)-98.2 F (36.8 C)] 98 F (36.7 C) (10/12 0820) Pulse Rate:  [66-73] 73 (10/12 0820) Resp:  [16-18] 18 (10/12 0820) BP: (121-132)/(53-63) 121/55 (10/12 1041) SpO2:  [92 %-97 %] 93 % (10/12 1041) Weight:  [58.5 kg (129 lb)] 58.5 kg (129 lb) (10/11 2016) Last BM Date: 2017/05/25  Weight change: Filed Weights   25-May-2017 1252 May 25, 2017 2034 05/28/2017 2016  Weight: 59 kg (130 lb) 58.8 kg (129 lb 10.1 oz) 58.5 kg (129 lb)    Intake/Output:   Intake/Output Summary (Last 24 hours) at 05/13/17 1204 Last data filed at 05/13/17 1046  Gross per 24 hour  Intake              240 ml  Output              101 ml  Net              139 ml     Physical Exam: General:  Fatigued and weak appearing. Lying flat in bed No resp difficulty at rest HEENT: normal Neck: supple. RIJ markedly dilated. JVP looks good on left. Carotids 2+ bilat; no bruits. No lymphadenopathy or thryomegaly appreciated. Cor: PMI nondisplaced. Regular rate & rhythm. No rubs, gallops or murmurs. Lungs: moderately decreased on right. Mildly decreased on R base Abdomen: soft, nontender, nondistended. No hepatosplenomegaly. No bruits or masses. Good bowel sounds. Extremities: no cyanosis, clubbing, rash, edema Neuro: alert & orientedx3, cranial nerves grossly intact. moves all 4 extremities w/o difficulty. Affect pleasant  Telemetry: SR 70s. Personally reviewed   Labs: Basic Metabolic  Panel:  Recent Labs Lab 05/08/17 0737 05/09/17 0312 05/10/17 0453 2017-05-25 0300 05/29/2017 0400  NA 130* 130* 129* 130* 131*  K 4.9 4.8 4.9 4.5 4.6  CL 98* 99* 99* 99* 102  CO2 22 21* 21* 19* 21*  GLUCOSE 120* 124* 117* 120* 100*  BUN 29* 35* 40* 44* 42*  CREATININE 1.81* 1.77* 1.70* 1.62* 1.69*  CALCIUM 10.0 9.8 9.7 9.6 9.0    Liver Function Tests: No results for input(s): AST, ALT, ALKPHOS, BILITOT, PROT, ALBUMIN in the last 168 hours. No results for input(s): LIPASE, AMYLASE in the last 168 hours. No results for input(s): AMMONIA in the last 168 hours.  CBC:  Recent Labs Lab 05/08/17 1254 05/09/17 0312 05/09/17 1410 05/25/17 0300 05/06/2017 0400  WBC 14.0* 15.1* DUP SEE M7172 12.3* 8.7  NEUTROABS  --   --  13.4* 10.2*  --   HGB 12.1 12.4 DUP SEE M7172 12.2 11.3*  HCT 36.6 37.6 DUP SEE M7172 36.6 34.1*  MCV 88.2 88.7 DUP SEE M7172 88.6 88.1  PLT 321 338 DUP SEE M7172 307 285    Cardiac Enzymes:  Recent Labs Lab 2017-05-25 1446 May 25, 2017 2041 05/23/2017 0400  TROPONINI 0.14* 0.13* 0.11*    BNP: BNP (last 3 results)  Recent Labs  2017-05-25 1446  BNP 729.4*  ProBNP (last 3 results) No results for input(s): PROBNP in the last 8760 hours.    Other results:  Imaging: Dg Chest Port 1 View  Result Date: 05/13/2017 CLINICAL DATA:  Chronic bilateral pleural effusions. Hx of chronic diastolic CHF, hypertensive heart disease, pneumonia, diabetes, cardiac catheterization, removal of pleural drainage catheter(2017-06-04). Former smoker(1970). EXAM: PORTABLE CHEST 1 VIEW COMPARISON:  05/10/2017 FINDINGS: Cardiac silhouette is mildly enlarged, partly obscured by lung base opacities. No mediastinal or hilar masses. There are persistent lung base opacities obscuring the hemidiaphragms consistent with a combination of pleural effusions with either atelectasis or pneumonia, or a combination. Remainder of the lungs is clear. No evidence of pulmonary edema. No pneumothorax.  Skeletal structures are demineralized. IMPRESSION: 1. No change from the previous study. 2. Bilateral lung base opacities consistent with combination of pleural effusions with either pneumonia, atelectasis or a combination. No convincing pulmonary edema. Electronically Signed   By: Amie Portland [name]   On: 05/13/2017 09:48     Medications:     Scheduled Medications: . amLODipine  5 mg Oral Daily  . apixaban  2.5 mg Oral BID  . famotidine  20 mg Oral Daily  . feeding supplement  1 Container Oral TID BM  . feeding supplement (PRO-STAT SUGAR FREE 64)  30 mL Oral BID  . furosemide  60 mg Intravenous BID  . mycophenolate  500 mg Oral BID  . sodium chloride flush  3 mL Intravenous Q12H  . tacrolimus  2 mg Oral BID    Infusions: . sodium chloride    . ceFTAROline (TEFLARO) IV 300 mg (05/13/17 1044)    PRN Medications: sodium chloride, acetaminophen **OR** acetaminophen, benzonatate, docusate sodium, magnesium hydroxide, ondansetron (ZOFRAN) IV, promethazine, ramelteon, senna-docusate, sodium chloride flush   Assessment:   [name] is a 83 y.o. female with Diastolic CHF, ESRD s/p renal transplant for FSGN, chronic pleural effusions requiring Pleurx cath placement,  PAF, Chronic respiratory failure with hypoxia, gout, and CAD s/p PCI (BMS to RCA in 2002)   Admitted for cough, weakness and SOB. Hf team consulted 06/04/17 for worsening SOB to look at volume status and possible PAH.    Plan/Discussion:    1. Acute on chronic diastolic CHF - Volume status hard to assess giving vascular access issues. Overall seems to be relatively well compensated  - I don't think HF is a major issue here in her dyspnea  - Will give one more dose IV lasix today and switch back to po lasix - I have reviewed CXR personally and she has had some recurrence of right effusion which is also contributing to her dyspnea  2. Pulmonary HTN - PA pressure 49 mm Hg by Echo. ? 2/2 from h/o of dialysis.  - RHC  unable to measure pressures as above  3. Chronic pleural effusion - Pleurx removed 10/10 due to S. Epidermidis per Dr. Donata Clay - Remains on ABX.    - Possible Pleurx replacement next week if re-accumulates per Dr. Donata Clay. - If [facility] turn + for stap will need TEE 4. Chronic respiratory failure with hypoxia - Continue O2. Per primary.  5. AKI on CKD III s/p renal transplant for FSGN - No labs today. Baseline 1.3 - 1.5 this admission. Stable 1.6 yesterday - Would recheck in am  5. Possible Jugular DVT - On DVT dose Eliquis originally, but with AKI 10/7 decreased back to 2.5 mg BID dosing.  - Suspect she has SVC stenosis due to previous perm cath for  HD 6. PAF  - Remains in NSR . - on Eliquis  7. CAD s/p PCI - s/p BMS to RCA in 2002. No ischemic work up since - No s/s of ischemia.  - Consider pravastatin with h/o transplant    Overall I think volume status is ok. Will switch back to po lasix. I think major issues are pleural effusion and increase debility/fraility. Given her Class IV symptoms and poor QOL would strongly recommend Palliative Care involvement.  We will sign off. Please call with questions.    Length of Stay: 15   Arvilla Meres MD 05/13/2017, 12:04 PM  Advanced Heart Failure Team Pager [phone] (M-F; 7a - 4p)  Please contact [facility] for night-coverage after hours (4p -7a ) and weekends on [email or web address]

## 2017-05-13 NOTE — Progress Notes (Signed)
Subjective:  Patient was seen laying in bed this AM. She states that she was relieved to get a good night of rest for the first time in a few days. She continues to endorse worsening fatigue and SOB while moving around room. She states it was difficult for her to move around last night, stating it was hard to get up to urinate last night.    Objective:  Vital signs in last 24 hours: Vitals:   05-24-17 1224 May 24, 2017 1637 May 24, 2017 2016 05/13/17 0625  BP: 128/61 132/63 (!) 124/53 126/63  Pulse: 73 66 72 72  Resp:  Temp: 97.7 F (36.5 C) 97.9 F (36.6 C) 98.2 F (36.8 C) (!) 97.4 F (36.3 C)  TempSrc: Oral Oral Oral Oral  SpO2: 96% 97% 92% 93%  Weight:   129 lb (58.5 kg)   Height:       Physical Exam  Constitutional:  Thin, elderly, chronically ill appearing woman laying in bed in no acute distress with [place] in place  Cardiovascular: Normal rate, regular rhythm and intact distal pulses.  Exam reveals no friction rub.   No murmur heard. Pulmonary/Chest:  No accessory muscle use or nasal flaring. No signs of cyanosis. Decreased breath sounds on right lung base, crackles on left lung base. Interval worsening from previous exams.  Abdominal: Soft. She exhibits no distension. There is no tenderness. There is no guarding.  Musculoskeletal: She exhibits no edema (of bilateral lower extremities) or tenderness (of bilateral lower extremities).  Skin: Skin is warm and dry. Capillary refill takes less than 2 seconds. No rash noted. No erythema.   Assessment/Plan:  Principal Problem:   Shortness of breath Active Problems:   History of renal transplant   Pleural effusion, bilateral   CAP (community acquired pneumonia)   Fatigue   Cough with sputum   Protein-calorie malnutrition (HCC)   Personal history of immunosupression therapy   Anorexia   Staphylococcus epidermidis infection   CKD (chronic kidney disease), symptom management only, stage 4 (severe) [facility])  [name]  is an 83 yo with PMH of T2DM, CKD s/p kidney transplant in 2003 on immunosuppression, diastolic CHF, and CAD who presented on 04/20/2017 with increased productive cough, worsening SOB, and progressively worsening malaise that caused inability to complete ADLs. The patient was admitted to the internal medicine teaching service for management. The specific problems addressed during this admission are as follows:  Chronic bilateral pleural fluid effusions complicated by indwelling catheter colonization:Patient had pleural catheter removed with CTCS on 10/10. Patient continuing on antibiotics today; leukocytosis resolved on 10/11 after catheter removal. Medically managing effusions with IV diuresis for now and obtain chest x ray to assess for size of pleural effusions today given worsening clinical status and decreased R sided breath sounds on PE. Based off results, will plan to touch base with CTCS regarding reevaluation of pleural catheter replacement.  -Infectious disease and cardiothoracic surgery consulted, appreciate recommendations -Continue renal dosing of ceftaroline 300 mg q12 hours -Follow up portable chest X ray  Acute on chronic HFpEF: Patient's echocardiography on 05/10/2017 showed grade 3 diastolic dysfunction, which represents worsening of grade 2 dysfunction previously observed on echocardiography on 01/2017. Patient's home lasix 80 mg was held on admission, in the setting of decreased PO intake and low BP. Patient's breathing subjectively improving with IV diuresis. Weight stable at 129 lbs today after IV Lasix 60 mg yesterday, patient reports 3 occurrence of urination overnight and subjective increase in her urine output.  Will plan to chart strict I&O today to ensure that she is diuresing with appropriately with this medication.  -Continue strict I&Os  -Continue IV Lasix 60 mg BID  Pulmonary hypertension:  The patient's echocardiography also showed elevated right ventricular systolic  pressure with moderate pulmonary hypertension (PA pressure = 49 mmHg). Right heart catheterization on 2017-05-31 was unable to be performed 2/2 multiple clots.   CKD stage IVs/p renal transplant with current immune suppression: Cr has been stable around 1.6 for the past few days. -Holding BMP to give patient lab holiday per patient request  Progressive fatigue and anorexia: Malignancy work up, including CT of neck and chest along with MRI abdomen and pelvis, has been negative thus far and only revealed a complex cyst on native kidney. Her progressive fatigue is most likely 2/2 chronic pleural effusions (with possible underlying infection) and worsening of diastolic heart failure.  Paroxysmal WU:JWJX controlled with metoprolol 50 mg BID at home and on Eliquis 2.5 mg BID for chronic anticoagulation. Patient on telemetry and no episodes of A-Fib observed on telemetry/EKG throughout admission. Holding home metoprolol 50 mg BID during acute CHF exacerbation.  -Eliquis 2.5mg  BID -Continue telemetry  Hyponatremia:Stable over the past few days. Will continue to encourage PO intake. Taking break from labs today.  Hypertension: Holding home amlodipine 5 mg daily as patient has been normotensive during admission.  Fluids: None Diet:Heart healthy, nutrition supplementation VTE prophylaxis: Eliquis 2.5mg  BID Code:DNR  Dispo: Anticipated discharge pending clinical improvement.  [name], MD 05/13/2017, 7:35 AM Pager: [phone]

## 2017-05-13 NOTE — Progress Notes (Signed)
Nutrition Follow-up  DOCUMENTATION CODES:   Severe malnutrition in context of chronic illness  INTERVENTION:   -Recommend liberalizing diet to REGULAR  -Continue Boost Breeze po TID, each supplement provides 250 kcal and 9 grams of protein  -Continue Pro-Stat BID  -Snacks between meals  -Add Magic Cup BID  NUTRITION DIAGNOSIS:   Malnutrition (severe) related to chronic illness (CHF) as evidenced by moderate depletion of body fat, severe depletion of muscle mass, energy intake < or equal to 75% for > or equal to 1 month.  Continues but being addressed via diet liberalization, supplements, snacks  GOAL:   Patient will meet greater than or equal to 90% of their needs  Not met  MONITOR:   PO intake, Supplement acceptance, Labs, I & O's  REASON FOR ASSESSMENT:   Consult Assessment of nutrition requirement/status  ASSESSMENT:   Pt with PMH of CAD, CHF, ESRD prior to renal transplant (2003), Type II DM,  presents with 3 week cough with sputum production, SOB, and fatigue found pleural effusion.   Pt continues to report poor appetite. Recorded po intake 0-10% of meals. Only able to drink 1 Boost Breeze per day, taking Pro-Stat BID.   Labs: sodium 131 Meds: lasix  Diet Order:  Diet renal/carb modified with fluid restriction Diet-HS Snack? Nothing; Room service appropriate? Yes; Fluid consistency: Thin  Skin:  Reviewed, no issues  Last BM:  10/10  Height:   Ht Readings from Last 1 Encounters:  04/21/2017 5' 3"  (1.6 m)    Weight:   Wt Readings from Last 1 Encounters:  05/29/2017 129 lb (58.5 kg)    Ideal Body Weight:  52.3 kg  BMI:  Body mass index is 22.85 kg/m.  Estimated Nutritional Needs:   Kcal:  1400-1600  Protein:  75-90 grams  Fluid:  >/= 1.4 L/d  EDUCATION NEEDS:   Education needs no appropriate at this time  [place], [place], LDN [phone] Pager  [phone] Weekend/On-Call Pager

## 2017-05-14 LAB — BASIC METABOLIC PANEL
Anion gap: 10 (ref 5–15)
BUN: 44 mg/dL — ABNORMAL HIGH (ref 6–20)
CO2: 20 mmol/L — ABNORMAL LOW (ref 22–32)
Calcium: 9.5 mg/dL (ref 8.9–10.3)
Chloride: 103 mmol/L (ref 101–111)
Creatinine, Ser: 1.76 mg/dL — ABNORMAL HIGH (ref 0.44–1.00)
GFR calc Af Amer: 30 mL/min — ABNORMAL LOW (ref >60)
GFR calc non Af Amer: 26 mL/min — ABNORMAL LOW (ref >60)
Glucose, Bld: 118 mg/dL — ABNORMAL HIGH (ref 65–99)
Potassium: 4.3 mmol/L (ref 3.5–5.1)
Sodium: 133 mmol/L — ABNORMAL LOW (ref 135–145)

## 2017-05-14 NOTE — Progress Notes (Signed)
Subjective:  Patient was seen laying uncomfortably on her left side this AM in bed. Patient states that it has become significantly harder to breath overnight and endorses increased fatigue today. Patient continues to state that her entire body hurts. She is especially discouraged because she was unable to move around the room on her own last night to use the bathroom.    Objective:  Vital signs in last 24 hours: Vitals:   05/13/17 2313 05/14/17 0602 05/14/17 0723 05/14/17 1018  BP: (!) 110/54 (!) 113/51  115/62  Pulse: 69 70  72  Resp: (!) 24 (!) 24  20  Temp: 98.2 F (36.8 C) (!) 97.5 F (36.4 C)  97.9 F (36.6 C)  TempSrc: Oral Oral  Oral  SpO2: (!) 88% 95%  95%  Weight:   130 lb 12.8 oz (59.3 kg)   Height:       Physical Exam  Constitutional:  Thin elderly appearing woman laying in bed in no acute distress but clearly uncomfortable  HENT:  Mouth/Throat: Oropharynx is clear and moist.  Eyes: EOM are normal.  Cardiovascular: Normal rate, regular rhythm and intact distal pulses.   No murmur heard. Pulmonary/Chest:  Decreased breath sounds on R hemithorax. Crackles at L lung base without evidence of wheezing.   Musculoskeletal: She exhibits no edema (of bilateral lower extremities) or tenderness (of bilateral lower extremities).  Skin: Skin is warm and dry. No erythema. No pallor.  Psychiatric: She has a normal mood and affect. Her behavior is normal. Judgment and thought content normal.   Assessment/Plan:  Principal Problem:   Shortness of breath Active Problems:   History of renal transplant   Pleural effusion, bilateral   CAP (community acquired pneumonia)   Fatigue   Cough with sputum   Protein-calorie malnutrition (HCC)   Personal history of immunosupression therapy   Anorexia   Staphylococcus epidermidis infection   CKD (chronic kidney disease), symptom management only, stage 4 (severe) [facility])  Ms Racquelle Hyser is an 83 yo with PMH of T2DM, CKD s/p kidney  transplant in 2003 on immunosuppression, diastolic CHF, and CAD who presented on 2017-05-03 with increased productive cough, worsening SOB, and progressively worsening malaise that caused inability to complete ADLs. The patient was admitted to the internal medicine teaching service for management. The specific problems addressed during this admission are as follows:  Chronic bilateral pleural fluid effusions complicated by indwelling catheter colonization:Patient had pleural catheter removed with CTCS on 10/10.  Leukocytosis resolved on 10/11 after catheter removal. Patient continuing on antibiotics today. CTCS will plan to replace pleurex catheter next week. Patient's CXR on 10/12 showed stable R with slight worsening of L pleural effusion. Overnight, however, the patient's breathing worsened and she was more uncomfortable today. Exam shows decreased breath sounds on right and increased crackles on left, worse from yesterday's exam. Bedside ultrasound showed fluid up to right shoulder in pleural space, with fluid to mid back on left side. Will plan for ultrasound guided thoracentesis with IR over the weekend. Will hold Eliquis until procedure complete.  -US guided thoracentesis with IR; culture, smear, cell count with diff, LDH, protein, AFB, and cytology on fluid obtained from procedure -Infectious disease and cardiothoracic surgery consulted, appreciate recommendations -Continue renal dosing of ceftaroline 300 mg q12 hours  Acute on chronic HFpEF: Patient's echocardiography on 05/10/2017 showed grade 3 diastolic dysfunction, which represents worsening of grade 2 dysfunction previously observed on echocardiography on 01/2017. Patient's home lasix 80 mg was held on admission, in the  setting of decreased PO intake and low BP. Patient underwent two days of IV diuresis and now back to home dosing. -Continue strict I&Os  -Continue PO lasix 80 mg BID -Continue daily BMP while on lasix  Pulmonary hypertension:  The patient's echocardiography also showed elevated right ventricular systolic pressure with moderate pulmonary hypertension (PA pressure = 49 mmHg). Right heart catheterization on 2017/05/21 was unable to be performed 2/2 multiple clots.   CKD stage IVs/p renal transplant with current immune suppression: Cr has been stable around 1.6 for the past few days. -Follow with daily BMP while on lasix  Progressive fatigue and anorexia: Malignancy work up, including CT of neck and chest along with MRI abdomen and pelvis, has been negative thus far and only revealed a complex cyst on native kidney. Her progressive fatigue is most likely 2/2 chronic pleural effusions (with possible underlying infection) and worsening of diastolic heart failure. -Patient reluctant to discuss case and home discharge planning with palliative care/hospice. Hospice will likely be the best option for her moving forward. Will continue to facilitate these discussions and plan to consult them once patient agreeable. -PT/OT signed off 2/2 lack of patient participation in the setting of worsening fatigue and SOB  Paroxysmal MW:NUUV controlled with metoprolol 50 mg BID at home and on Eliquis 2.5 mg BID for chronic anticoagulation. Patient on telemetry and no episodes of A-Fib observed on telemetry/EKG throughout admission. Holding home metoprolol 50 mg BID. Patient currently with normal rate.  -Eliquis 2.5mg  BID on hold pending US guided thoracentesis -Continue telemetry  Hyponatremia:Stable over the past few days. Will continue to encourage PO intake. -Continue to monitor  Hypertension:Holding home amlodipine 5 mg daily as patient has been normotensive during admission.  Fluids: None Diet:Heart healthy, nutrition supplementation VTE prophylaxis: Holding Eliquis 2.5mg  BID pending IR thoracentesis Code:DNR  Dispo: Anticipated discharge pending clinical improvement.   [name], MD 05/14/2017, 1:44 PM Pager:  [phone]

## 2017-05-14 NOTE — Progress Notes (Signed)
  Date: 05/14/2017  Patient name: [name]  Medical record number: [id number]  Date of birth: 02-07-1932   I have seen and evaluated this patient and I have discussed the plan of care with the house staff. Please see their note for complete details. I concur with their findings with the following additions/corrections:   [name] appears noticeably worse this morning. She was short of breath at rest and appeared even more drawn and fatigued. She has now had her pleural catheter out for 3 days, and I am concerned that her pleural effusions are accumulating to the extent that they are impeding her breathing. We performed bedside ultrasound and identified a large pleural effusion on the right extending all the way up to the scapula. At the base, the lung parenchyma was not even visible. There is also a small pleural effusion on the left. We consulted interventional radiology, who agreed to perform a thoracentesis for her either today or tomorrow. Pulmonology was backed up and did not think there would be able to get to her today.  In the meantime, we will continue gentle diuresis. We also are on day 5 of ceftaroline for staph epi infection of her pleural space and pleural catheter.  Anne Shutter, MD 05/14/2017, 5:35 PM

## 2017-05-14 NOTE — Plan of Care (Signed)
Problem: Tissue Perfusion: Goal: Risk factors for ineffective tissue perfusion will decrease Outcome: Not Progressing Patient cannot tolerate mobility without SOB/fatigue  Problem: Activity: Goal: Risk for activity intolerance will decrease Outcome: Not Progressing Cannot tolerate activity

## 2017-05-15 ENCOUNTER — Inpatient Hospital Stay (HOSPITAL_COMMUNITY): Payer: Medicare Other

## 2017-05-15 LAB — PROTEIN, PLEURAL OR PERITONEAL FLUID: Total protein, fluid: <3 g/dL

## 2017-05-15 LAB — BODY FLUID CELL COUNT WITH DIFFERENTIAL
Eos, Fluid: 41 %
Lymphs, Fluid: 26 %
Monocyte-Macrophage-Serous Fluid: 26 % — ABNORMAL LOW (ref 50–90)
Neutrophil Count, Fluid: 7 % (ref 0–25)
Total Nucleated Cell Count, Fluid: 949 cu mm (ref 0–1000)

## 2017-05-15 LAB — GLUCOSE, PLEURAL OR PERITONEAL FLUID: Glucose, Fluid: 106 mg/dL

## 2017-05-15 LAB — GRAM STAIN

## 2017-05-15 LAB — LACTATE DEHYDROGENASE, PLEURAL OR PERITONEAL FLUID: LD, Fluid: 95 U/L — ABNORMAL HIGH (ref 3–23)

## 2017-05-15 LAB — CULTURE, BODY FLUID W GRAM STAIN -BOTTLE

## 2017-05-15 LAB — CULTURE, BODY FLUID-BOTTLE

## 2017-05-15 LAB — LACTATE DEHYDROGENASE: LDH: 162 U/L (ref 98–192)

## 2017-05-15 LAB — PROTEIN, TOTAL: Total Protein: 5.3 g/dL — ABNORMAL LOW (ref 6.5–8.1)

## 2017-05-15 MED ORDER — LIDOCAINE HCL (PF) 1 % IJ SOLN
INTRAMUSCULAR | Status: AC
  Filled 2017-05-15: qty 30

## 2017-05-15 NOTE — Procedures (Addendum)
PROCEDURE SUMMARY:  Successful US guided right diagnostic and therapeutic thoracentesis. Yielded 900 mL of hazy yellow fluid. Pt tolerated procedure well. No immediate complications, however procedure was stopped prior to removal of all fluid due to patient discomfort.   Specimen was sent for labs. CXR ordered.  Hoyt Koch PA-C 05/15/2017 11:01 AM

## 2017-05-15 NOTE — Progress Notes (Signed)
Subjective:  Patient was evaluated this morning. She was laying on her left side in no acute distress but appeared uncomfortable. She states that her breathing has remained stable overnight.  She also reports several bowel movements overnight.  Objective:  Vital signs in last 24 hours: Vitals:   05/14/17 1835 05/14/17 2003 05/15/17 0236 05/15/17 0449  BP: 111/71   119/61  Pulse: 80   79  Resp: 20   20  Temp: 98.1 F (36.7 C)   97.7 F (36.5 C)  TempSrc: Oral     SpO2: 94%   90%  Weight:  155 lb (70.3 kg) 154 lb 15.7 oz (70.3 kg)   Height:       Physical Exam  Constitutional:  Thin elderly appearing woman laying in bed in no acute distress but clearly uncomfortable  HENT:  Mouth/Throat: Oropharynx is clear and moist.  Eyes: EOM are normal.  Cardiovascular: Normal rate, regular rhythm and intact distal pulses.   No murmur heard. Pulmonary/Chest:  Decreased breath sounds on R hemithorax. Crackles at L lung base without evidence of wheezing.   Musculoskeletal: She exhibits no edema (of bilateral lower extremities) or tenderness (of bilateral lower extremities).  Skin: Skin is warm and dry. No erythema. No pallor.  Psychiatric: She has a normal mood and affect. Her behavior is normal. Judgment and thought content normal.   Assessment/Plan:  Principal Problem:   Shortness of breath Active Problems:   History of renal transplant   Pleural effusion, bilateral   CAP (community acquired pneumonia)   Fatigue   Cough with sputum   Protein-calorie malnutrition (HCC)   Personal history of immunosupression therapy   Anorexia   Staphylococcus epidermidis infection   CKD (chronic kidney disease), symptom management only, stage 4 (severe) [facility])  Ms Glenys Snader is an 83 yo with PMH of T2DM, CKD s/p kidney transplant in 2003 on immunosuppression, diastolic CHF, and CAD who presented on 04/07/2017 with increased productive cough, worsening SOB, and progressively worsening malaise that  caused inability to complete ADLs. The patient was admitted to the internal medicine teaching service for management. The specific problems addressed during this admission are as follows:  Chronic bilateral pleural fluid effusions complicated by indwelling catheter colonization:Patient had pleural catheter removed with CTCS on 10/10.  Leukocytosis resolved on 10/11 after catheter removal. Patient continuing on antibiotics, today will be day 6 of ceftaroline for staph epi infection of pleural space. CTCS will plan to replace pleurex catheter next week.  Bedside ultrasound 10/13 showed fluid up to right shoulder in pleural space, with fluid to mid back on left side. Will plan for ultrasound guided thoracentesis with IR today. Holding Eliquis until procedure complete.  -US guided thoracentesis with IR; culture, smear, cell count with diff, LDH, protein, AFB, and cytology on fluid obtained from procedure -Infectious disease and cardiothoracic surgery consulted, appreciate recommendations -Continue renal dosing of ceftaroline 300 mg q12 hours  Acute on chronic HFpEF: Patient's echocardiography on 05/10/2017 showed grade 3 diastolic dysfunction, which represents worsening of grade 2 dysfunction previously observed on echocardiography on 01/2017. Has required IV diureses, currently on home dose lasix. -Continue strict I&Os  -Continue PO lasix 80 mg BID -Continue daily BMP while on lasix  Pulmonary hypertension: The patient's echocardiography also showed elevated right ventricular systolic pressure with moderate pulmonary hypertension (PA pressure = 49 mmHg). Right heart catheterization on 06/01/2017 was unable to be performed 2/2 multiple clots.   CKD stage IVs/p renal transplant with current immune suppression: Cr has  been stable around 1.6 for the past few days. -Follow with daily BMP while on lasix  Progressive fatigue and anorexia: Malignancy work up, including CT of neck and chest along with MRI  abdomen and pelvis, has been negative thus far and only revealed a complex cyst on native kidney. Her progressive fatigue is most likely 2/2 chronic pleural effusions (with possible underlying infection) and worsening of diastolic heart failure. - Continue open discussion of consulting palliative care -PT/OT signed off 2/2 lack of patient participation in the setting of worsening fatigue and SOB  Paroxysmal ZO:XWRU controlled with metoprolol 50 mg BID at home and on Eliquis 2.5 mg BID for chronic anticoagulation. Patient on telemetry and no episodes of A-Fib observed on telemetry/EKG throughout admission. Holding home metoprolol 50 mg BID. Patient currently with normal rate.  -Eliquis 2.5mg  BID on hold pending US guided thoracentesis -Continue telemetry  Hyponatremia:Stable over the past few days. Will continue to encourage PO intake. -Continue to monitor  Hypertension:normotensive -On home amlodipine 5 mg daily  Fluids: None Diet:Heart healthy, nutrition supplementation VTE prophylaxis: Holding Eliquis 2.5mg  BID pending IR thoracentesis Code:DNR  Dispo: Anticipated discharge pending clinical improvement.   Geralyn Corwin Shubert, DO 05/15/2017, 7:15 AM Pager: [phone]

## 2017-05-15 NOTE — Progress Notes (Signed)
  Date: 05/15/2017  Patient name: [name]  Medical record number: [id number]  Date of birth: October 20, 1931   I have seen and evaluated this patient and I have discussed the plan of care with the house staff. Please see their note for complete details. I concur with their findings with the following additions/corrections:   Able to go for thoracentesis this morning with 900 mL drained from the right pleural space, appreciate IR performing this. She reports this afternoon that this is the best she's felt in a while. She is still short of breath with any movement including rolling over in bed, but reports her breathing is okay if she stays still. We are still having difficulty obtaining accurate I's and O's and her, so it is difficult to assess how much diuresis may have played a role in her feeling better, but I expect the primary component was the thoracentesis.  On review of her imaging, the left-sided pleural effusion has increased significantly during this hospitalization. We may need to pursue drainage of both pleural effusions to provide her adequate relief. In the meantime, we will continue antibiotics and discuss with CT surgery replacement of her pleural catheter.  Anne Shutter, MD 05/15/2017, 4:11 PM

## 2017-05-16 ENCOUNTER — Encounter (HOSPITAL_COMMUNITY): Payer: Self-pay | Admitting: Radiology

## 2017-05-16 ENCOUNTER — Inpatient Hospital Stay (HOSPITAL_COMMUNITY): Payer: Medicare Other

## 2017-05-16 HISTORY — PX: IR THORACENTESIS RIGHT ASP PLEURAL SPACE W/IMG GUIDE: IMG5380

## 2017-05-16 LAB — CBC
HCT: 34.2 % — ABNORMAL LOW (ref 36.0–46.0)
Hemoglobin: 11 g/dL — ABNORMAL LOW (ref 12.0–15.0)
MCH: 28.9 pg (ref 26.0–34.0)
MCHC: 32.2 g/dL (ref 30.0–36.0)
MCV: 89.8 fL (ref 78.0–100.0)
Platelets: 299 10*3/uL (ref 150–400)
RBC: 3.81 MIL/uL — ABNORMAL LOW (ref 3.87–5.11)
RDW: 14.6 % (ref 11.5–15.5)
WBC: 5.9 10*3/uL (ref 4.0–10.5)

## 2017-05-16 LAB — BASIC METABOLIC PANEL
Anion gap: 10 (ref 5–15)
BUN: 42 mg/dL — ABNORMAL HIGH (ref 6–20)
CO2: 19 mmol/L — ABNORMAL LOW (ref 22–32)
Calcium: 9.4 mg/dL (ref 8.9–10.3)
Chloride: 105 mmol/L (ref 101–111)
Creatinine, Ser: 1.81 mg/dL — ABNORMAL HIGH (ref 0.44–1.00)
GFR calc Af Amer: 29 mL/min — ABNORMAL LOW (ref >60)
GFR calc non Af Amer: 25 mL/min — ABNORMAL LOW (ref >60)
Glucose, Bld: 110 mg/dL — ABNORMAL HIGH (ref 65–99)
Potassium: 4.3 mmol/L (ref 3.5–5.1)
Sodium: 134 mmol/L — ABNORMAL LOW (ref 135–145)

## 2017-05-16 MED ORDER — LIDOCAINE 2% (20 MG/ML) 5 ML SYRINGE
INTRAMUSCULAR | Status: AC
  Filled 2017-05-16: qty 10

## 2017-05-16 MED ORDER — FUROSEMIDE 80 MG PO TABS
80.0000 mg | ORAL_TABLET | Freq: Every day | ORAL | Status: DC
  Administered 2017-05-16 – 2017-05-17 (×2): 80 mg via ORAL
  Filled 2017-05-16 (×2): qty 1

## 2017-05-16 NOTE — Progress Notes (Signed)
Pt refused to drink anything last night. When pt took her medications she took them with half a cup of peaches and the juice that came in the container. This nurse placed water and there was a tea at the bedside that pt did not touch.   Larey Days, RN

## 2017-05-16 NOTE — Progress Notes (Signed)
  Date: 05/16/2017  Patient name: [name]  Medical record number: [id number]  Date of birth: 1931/10/29   I have seen and evaluated this patient and I have discussed the plan of care with the house staff. Please see their note for complete details. I concur with their findings with the following additions/corrections:   Appreciate IR performing left-sided thoracentesis today, 600 cc removed, hopefully will provide some additional relief for her. We will continue antibiotics, but may need a pigtail for ongoing drainage of her larger right-sided effusion until we are able to replace pleurex.   Overall, I am not sure we will be able to provide enough symptom relief to get her back to the quality of life she would like to have. She is considering palliative care involvement and may elect comfort care in the near future.  Anne Shutter, MD 05/16/2017, 6:16 PM

## 2017-05-16 NOTE — Progress Notes (Addendum)
Subjective:  Patient was seen laying uncomfortably in bed this AM in no acute distress. Patient states she feels a little better after pleural fluid drained from R side. She states that she still feels tired throughout her entire body and that she can't move in bed without becoming short of breath. She states she can no longer walk on her own and expresses frustration that she can't do anything without nursing help.   Objective:  Vital signs in last 24 hours: Vitals:   05/15/17 1526 05/15/17 2036 05/16/17 0217 05/16/17 0436  BP: 122/64 123/60  (!) 120/58  Pulse: 66 72  70  Resp: Temp: 98.8 F (37.1 C) 98.2 F (36.8 C)  98.1 F (36.7 C)  TempSrc: Oral     SpO2: 98% 96%  90%  Weight:  149 lb 14.6 oz (68 kg) 149 lb 14.6 oz (68 kg)   Height:       Physical Exam  Constitutional:  Sick appearing woman laying on left side in bed in no acute distress.   HENT:  Mouth/Throat: Oropharynx is clear and moist.  Cardiovascular: Normal rate and regular rhythm.  Exam reveals no friction rub.   No murmur heard. Pulmonary/Chest:  Nasal cannula in place. No accessory muscle use or nasal flaring. Crackles throughout L lung up to mid back, interval worsening from previous exams. Increased breath sounds on R hemithorax, interval improvement since previous exams.  Abdominal: Soft. She exhibits no distension. There is no tenderness.  Musculoskeletal: She exhibits no edema (of bilateral lower extremities) or tenderness (of bilateral lower extremities).  Skin: Skin is warm and dry. No rash noted. No erythema.   Assessment/Plan:  Principal Problem:   Shortness of breath Active Problems:   History of renal transplant   Pleural effusion, bilateral   CAP (community acquired pneumonia)   Fatigue   Cough with sputum   Protein-calorie malnutrition (HCC)   Personal history of immunosupression therapy   Anorexia   Staphylococcus epidermidis infection   CKD (chronic kidney disease), symptom  management only, stage 4 (severe) (HCC)  [name] is an 83 yo with PMH of T2DM, CKD s/p kidney transplant in 2003 on immunosuppression, diastolic CHF, and CAD who presented on 04/26/2017 with increased productive cough, worsening SOB, and progressively worsening malaise that caused inability to complete ADLs. The patient was admitted to the internal medicine teaching service for management. The specific problems addressed during this admission are as follows:  Chronic bilateralpleural fluid effusions complicated by indwelling catheter colonization:Patient had pleural catheter removed with CTCS on 10/10.  Leukocytosis resolved on 10/11 after catheter removal. Patient continuing on IV antibiotics. Patient underwent R thoracentesis with IR on 10/14 and endorses continued improvement in her symptoms of SOB after this procedure. CTCS does not recommend replacement of R catheter until cultures are negative off antibiotics and recommend IR pigtail on R for maintenance of recurrent pleural effusions. Consult with them today suggests L thoracentesis today. They also recommend holding pigtail placement pending clinical evidence of pleural fluid accumulation on right and culture data from 10/14 thoracentesis.  -Infectious disease and cardiothoracic surgery consulted, appreciate recommendations -IR US guided L thoracentesis today -Continue renal dosing of ceftaroline 300 mg q12 hours until R thoracentesis cultures negative  Chronic HFpEF (grade 3 diastolic dysfunction 05/10/2017 echo): Patient's echocardiography on 05/10/2017 showed worsened diastolic dysfunction from that previously measured in 01/2017. Patient's home lasix 80 mg was held on admission, in the setting of decreased PO intake. Patient having  even less PO intake throughout admission, but will transition to daily lasix given worsening intake over the past few days with corresponding decreased urine output. -Continue strict I&Os  -PO lasix 80 mg  daily -Continue daily BMP while on lasix  Pulmonary hypertension: The patient's echocardiography also showed elevated right ventricular systolic pressure with moderate pulmonary hypertension (PA pressure = 49 mmHg). Right heart catheterization on 05/04/2017 was unable to be performed 2/2 multiple clots.   CKD stage IVs/p renal transplant with current immune suppression: Cr has been stable around 1.6-1.8 this week. Will continue to monitor with daily BMP and strict I&O for urine output.   Progressive fatigue and anorexia: Malignancy work up, including CT of neck and chest along with MRI abdomen and pelvis, has been negative. Progressive fatigue is most likely 2/2 chronic pleural effusions and worsening of diastolic heart failure. Patient continues to endorse symptoms of entire body fatigue, even with draining of pleural effusions with IR. Will continue to follow.  -Patient reluctant to discuss home discharge planning with palliative care/hospice. Hospice will likely be the best option for her moving forward but patient doesn't want to talk about this until Wednesday October 17. Will continue to facilitate discussion as best as possible -PT/OT signed off 2/2 lack of patient participation in the setting of worsening fatigue  Paroxysmal ZO:XWRU controlled with metoprolol 50 mg BID at home and on Eliquis 2.5 mg BID for chronic anticoagulation. Patient on telemetry and no episodes of A-Fib observed on telemetry/EKG throughout admission. Holding home metoprolol 50 mg BID. Patient currently with normal rate.  -Eliquis 2.5mg  BID on hold pending US guided thoracentesis as discussed above -Continue telemetry  Hypertension: -Home amlodipine 5 mg daily   Fluids: None Diet:Heart healthy, nutrition supplementation VTE prophylaxis: Holding Eliquis 2.5mg  BID pending possible IR procedures, SCDs while in bed Code:DNR  Dispo: Anticipated discharge pending clinical improvement.  [name],  MD 05/16/2017, 8:38 AM Pager: [phone]

## 2017-05-16 NOTE — Progress Notes (Signed)
CT surgery  Would not replace R pleurx until pleural fluid cultures are negative off antibiotics Yesterday the fluid had heavy WBCs If effusion recurrs patient will need R pigtail by IR until infection is sterilized  P Donata Clay MD

## 2017-05-16 NOTE — Procedures (Signed)
PROCEDURE SUMMARY:  Successful US guided left thoracentesis. Yielded of clear yellow fluid. Pt tolerated procedure well. No immediate complications.  Specimen was not sent for labs. CXR ordered.  Brayton El PA-C 05/16/2017 3:37 PM

## 2017-05-17 ENCOUNTER — Inpatient Hospital Stay (HOSPITAL_COMMUNITY): Payer: Medicare Other

## 2017-05-17 ENCOUNTER — Encounter (HOSPITAL_COMMUNITY): Payer: Self-pay | Admitting: General Surgery

## 2017-05-17 DIAGNOSIS — T8579XA Infection and inflammatory reaction due to other internal prosthetic devices, implants and grafts, initial encounter: Secondary | ICD-10-CM

## 2017-05-17 DIAGNOSIS — I130 Hypertensive heart and chronic kidney disease with heart failure and stage 1 through stage 4 chronic kidney disease, or unspecified chronic kidney disease: Secondary | ICD-10-CM

## 2017-05-17 LAB — BLOOD GAS, ARTERIAL
ACID-BASE DEFICIT: 4.2 mmol/L — AB (ref 0.0–2.0)
BICARBONATE: 20.9 mmol/L (ref 20.0–28.0)
Drawn by: 331471
O2 CONTENT: 4 L/min
O2 SAT: 78 %
PCO2 ART: 41.8 mmHg (ref 32.0–48.0)
PO2 ART: 44.6 mmHg — AB (ref 83.0–108.0)
Patient temperature: 98.6
pH, Arterial: 7.32 — ABNORMAL LOW (ref 7.350–7.450)

## 2017-05-17 LAB — BASIC METABOLIC PANEL
Anion gap: 8 (ref 5–15)
BUN: 36 mg/dL — ABNORMAL HIGH (ref 6–20)
CO2: 22 mmol/L (ref 22–32)
Calcium: 9.5 mg/dL (ref 8.9–10.3)
Chloride: 106 mmol/L (ref 101–111)
Creatinine, Ser: 2.02 mg/dL — ABNORMAL HIGH (ref 0.44–1.00)
GFR calc Af Amer: 25 mL/min — ABNORMAL LOW (ref >60)
GFR calc non Af Amer: 22 mL/min — ABNORMAL LOW (ref >60)
Glucose, Bld: 103 mg/dL — ABNORMAL HIGH (ref 65–99)
Potassium: 3.9 mmol/L (ref 3.5–5.1)
Sodium: 136 mmol/L (ref 135–145)

## 2017-05-17 LAB — PH, BODY FLUID: pH, Body Fluid: 7.5

## 2017-05-17 LAB — ACID FAST SMEAR (AFB, MYCOBACTERIA): Acid Fast Smear: NEGATIVE

## 2017-05-17 MED ORDER — FUROSEMIDE 10 MG/ML IJ SOLN
60.0000 mg | Freq: Once | INTRAMUSCULAR | Status: AC
  Administered 2017-05-17: 60 mg via INTRAVENOUS
  Filled 2017-05-17: qty 6

## 2017-05-17 NOTE — Progress Notes (Signed)
Subjective:  Patient seen laying in bed this AM with [place] in place in no acute distress. She continues to endorse entire body fatigue. She states she had trouble sleeping overnight 2/2 in bilateral lower extremities while wearing SCDs, however pain improved when she took them off for a little bit. Patient denies SOB while laying still and states she hasn't been up to use the bathroom in a while. She says her SOB and malaise is better after L thoracentesis yesterday but thinks that if she tried to get up today her "whole body would give out".   Objective:  Vital signs in last 24 hours: Vitals:   05/16/17 1007 05/16/17 1748 05/16/17 2144 05/17/17 0540  BP: 121/66 (!) 114/55 (!) 121/58 (!) 109/41  Pulse:  68 70 72  Resp:  Temp:  98 F (36.7 C) 98 F (36.7 C) 98.2 F (36.8 C)  TempSrc:  Oral Oral Oral  SpO2:  100% 100% 97%  Weight:   149 lb (67.6 kg)   Height:       Physical Exam  Constitutional:  Elderly, chronically sick appearing woman laying in bed with [place] in place in no acute distress  HENT:  Mouth/Throat: Oropharynx is clear and moist.  Neck:  Bilateral chronic IJ swelling, stable from previous exams  Cardiovascular: Normal rate and regular rhythm.  Exam reveals no friction rub.   No murmur heard. Pulmonary/Chest:  No accessory muscle use or nasal flaring. Able to speak in full sentences without tachypnea or SOB. Decreased breath sounds on R to base of shoulder, interval worsening from previous exams. Intermittent crackles on L lung base, improved from previous exams.  Abdominal: Soft. She exhibits no distension. There is no tenderness. There is no guarding.  Musculoskeletal: She exhibits no edema (of bilateral lower extremities) or tenderness (of bilateral lower extremities).  Skin: Skin is warm and dry. No rash noted. No erythema.  Psychiatric: She has a normal mood and affect. Her behavior is normal.   Assessment/Plan:  Principal Problem:   Shortness of  breath Active Problems:   History of renal transplant   Pleural effusion, bilateral   CAP (community acquired pneumonia)   Fatigue   Cough with sputum   Protein-calorie malnutrition (HCC)   Personal history of immunosupression therapy   Anorexia   Staphylococcus epidermidis infection   CKD (chronic kidney disease), symptom management only, stage 4 (severe) (HCC)  [name] is an 83 yo with PMH of T2DM, CKD s/p kidney transplant in 2003 on immunosuppression, diastolic CHF, and CAD who presented on May 01, 2017 with increased productive cough, worsening SOB, and progressively worsening malaise that caused inability to complete ADLs. The patient was admitted to the internal medicine teaching service for management. The specific problems addressed during this admission are as follows:  Chronic bilateralpleural fluid effusions complicated by indwelling catheter colonization:Patient had pleural catheter removed with CTCS on 10/10. Leukocytosis resolved on 10/11 after catheter removal. Patient underwent R thoracentesis with IR on 10/14 (900 mL, transudate fluid based off studies sent) and L thoracentesis on 10/15 (600 mL). Most recent thoracentesis gram stain shows no organisms with multiple WBCs, AFB negative. ID recommends antibiotic treatment for 2 weeks after catheter removal (end date 05/26/17). Will continue antibiotics and consult IR for temporary R pigtail drain until pleurex catheter can be replaced with CTCS. -Infectious disease and cardiothoracic surgery consulted, appreciate recommendations -IR consult for pigtail placement today -Continue renal dosing of ceftaroline 300 mg q12 hours, day 6/14 -Pleural fluid  cultures from R thoracentesis no growth to date (24 hours) -Cytology of pleural fluid from R thoracentesis pending  Chronic HFpEF (grade 3 diastolic dysfunction 05/10/2017 echo): Patient's echocardiography on 05/10/2017 showed worsened diastolic dysfunction from that previously measured  in 01/2017. Patient's home lasix 80 mg was held on admission, in the setting of decreased PO intake. Patient having even less PO intake throughout admission with corresponding decreased urine output (100 mL overnight, with 90 mL intake charted).  -Continue strict I&Os  -PO lasix 80 mg daily -Continue daily BMP while on lasix  Pulmonary hypertension: The patient's echocardiography also showed elevated right ventricular systolic pressure with moderate pulmonary hypertension (PA pressure = 49 mmHg). Right heart catheterization on 05-31-2017 was unable to be performed 2/2 multiple clots.   CKD stage IVs/p renal transplant with current immune suppression: Cr has been stable around 1.6-1.8 this week. Will continue to monitor with daily BMP and strict I&O for urine output.   Progressive fatigue and anorexia: Malignancy work up, including CT of neck and chest along with MRI abdomen and pelvis, has been negative. Progressive fatigue is most likely 2/2 chronic pleural effusions and worsening of diastolic heart failure. Patient continues to endorse symptoms of entire body fatigue, even with draining of pleural effusions with IR although her breathing does improve with this intervention.  -Will continue to discuss palliative care consult with patient -PT/OT signed off 2/2 lack of patient participation in the setting of worsening fatigue  Paroxysmal ZH:YQMV controlled with metoprolol 50 mg BID at home and on Eliquis 2.5 mg BID for chronic anticoagulation. Patient on telemetry and no episodes of A-Fib. Holding home metoprolol 50 mg BID as patient had acute CHF exacerbation during hospitalization.  -Eliquis 2.5mg  BID on hold pending discussion with IR regarding placement of R pleural drain -Continue telemetry while inpatient  Hypertension:BP < goal of 140/90 during admission. -Home amlodipine 5 mg daily   Fluids: None Diet:Heart healthy, nutrition supplementation VTE prophylaxis: Holding Eliquis 2.5mg   BID pending possible IR procedures, SCDs while in bed Code:DNR  Dispo: Anticipated discharge pending clinical improvement.  [name], MD 05/17/2017, 7:06 AM Pager: [phone]

## 2017-05-17 NOTE — Progress Notes (Signed)
Patients oxygen saturation was at 78% on 2 liters. Increased oxygen to 6 liters and oxygen saturation increased to 92%. Placed a non-rebreather on her and her oxygen saturation increased to 96%. I called rapid response and respiratory therapy and are both at bedside. MD notified and at bedside as well. Orders placed. Will continue to monitor.

## 2017-05-17 NOTE — Progress Notes (Signed)
After rapid response, resipratory therapy and MD left, patients oxygen saturation dropped down to 88% on 3 liters. I increased her oxygen to 6 liters nasal canulla, O2 sats 90%. Per MD, patient received 60 mg of Lasix and a chest xray was completed. Non-rebreather reapplied and on 13 liters and patients oxygen saturation is at 93%. MD notified and stated the would come to assess her at bedside. MD put in orders for patient to be transferred to step down unit. Orders given and followed.

## 2017-05-17 NOTE — Progress Notes (Signed)
Routine VS obtained about 1720.  O2 sats 78% on 2l [place].  Per RN patient then became SOB and sat on the side of the bed.  RN increased O2 to 6L [place] O2 sats 86% then placed patient on NRB.  O2 sats 96% upon my arrival.  Patient does not appear in distress, but does endorse some SOB and states she is starting to feel better.  Weaned O2 to 4L [place] O2 sats 97%.  RT at bedside obtaining ABG per orders.  Residents at bedside.  New orders received.   RN to call if assistance needed.

## 2017-05-17 NOTE — Consult Note (Signed)
Chief Complaint: right pleural effusion  Referring Physician:Dr. Tharon Aquas Trigt  Supervising Physician: Aletta Edouard  Patient Status: [facility] - In-pt  HPI: [name] is a 83 y.o. female with multiple medical problems including recurrent pleural effusions, specifically on the right side.  She had a pleurX catheter placed by [facility] that was recently removed secondary to infection.  She had a thoracentesis a couple of days ago for which the cultures are currently negative.  She will need to complete 9 total days of abx therapy for this infection.  To avoid repeat thoracenteses, a chest drain has been requested until she has sterilized her fluid and can have a PleurX catheter replaced.    Past Medical History:  Past Medical History:  Diagnosis Date  . Candidal esophagitis ([place])    a. 03/2014.  Marland Kitchen Cholelithiasis   . Chronic diastolic CHF (congestive heart failure) ([place])    a. 06/2011 Echo: EF 60-65%, no rwma, Gr1 DD, mild TR. // b. Echo 12/19/15: Severe LVH, EF 55-60%, normal wall motion, mild MR, moderate LAE, moderate TR, PASP 44 mmHg  . CKD (chronic kidney disease), stage III ([place])    a. in setting of prior ESRD and cadaveric tx in 2003.  Marland Kitchen Coronary artery disease    a. 01/2001 Cath/PCI: LAD 50p, 75m D1 50-60, RCA [id number] BX Velocity BMS).// b. NSTEMI 5/17 (demand ischemia) in setting of AF with RVR, pneumonia, CKD, a/c HF >> Myoview 12/26/15: prob inf infarct, no ischemia, EF 52%, Low Risk >> med Rx  . Dyspnea   . Gastritis    a. 03/2014  . Gout    unconfirmed by joint aspiration  . History of bacteremia    a. 08/2004 Group A Strep bacteremia.  . Hypertensive heart disease   . Pneumonia   . S/P kidney transplant    a. due to FSGN, follows with Dr. MaCecil Crankern 2003. Was on HD prior to that.  . [place]    Past Surgical History:  Past Surgical History:  Procedure Laterality Date  . ABDOMINAL HYSTERECTOMY    . CHEST TUBE INSERTION Right  07/20/2016   Procedure: INSERTION RIGHT PLEURAL DRAINAGE CATHETER;  Surgeon: PeIvin PootMD;  [facility];  Service: Thoracic;  Laterality: Right;  . ESOPHAGOGASTRODUODENOSCOPY N/A 03/20/2014   Procedure: ESOPHAGOGASTRODUODENOSCOPY (EGD);  Surgeon: PaBeryle BeamsMD;  [facility];  Service: Endoscopy;  Laterality: N/A;  . IR THORACENTESIS ASP PLEURAL SPACE W/IMG GUIDE  05/16/2017  . KIDNEY TRANSPLANT  2003  . REMOVAL OF PLEURAL DRAINAGE CATHETER Right 05/13/2017   Procedure: REMOVAL OF PLEURAL DRAINAGE CATHETER;  Surgeon: VaIvin PootMD;  [facility] Service: Thor[facility];  Laterality: Right;  . RIGHT HEART CATH N/A 05/05/2017   Procedure: RIGHT HEART CATH;  Surgeon: BeJolaine ArtistMD;  [facility];  Service: Cardiovascular;  Laterality: N/A;  . VIDEO BRONCHOSCOPY Bilateral 05/12/2016   Procedure: VIDEO BRONCHOSCOPY WITHOUT FLUORO;  Surgeon: MiTanda RockersMD;  Location: [facility]OSCOPY;  Service: Cardiopulmonary;  Laterality: Bilateral;    Family History:  Family History  Problem Relation Age of Onset  . CAD Father   . CAD Brother   . CAD Unknown        Both parents and multiple siblings have died from coronary disease prior to age 4835nd several in their 4044'snd 5060's   Social History:  reports that she quit smoking about 48 years  ago. She has never used smokeless tobacco. She reports that she does not drink alcohol or use drugs.  Allergies:  Allergies  Allergen Reactions  . Sulfa Drugs Cross Reactors Swelling    Medications: Medications reviewed in epic  Please HPI for pertinent positives, otherwise complete 10 system ROS negative, anorexia.  Mallampati Score: MD Evaluation Airway: WNL Heart: WNL Abdomen: WNL Chest/ Lungs: WNL Chest/ lungs comments: Recurrent pleural effusion  ASA  Classification: 3 Mallampati/Airway Score: Two  Physical Exam: BP (!) 97/57   Pulse 74   Temp 97.7 F (36.5 C) (Oral)   Resp 17   Ht  5' 3" (1.6 m)   Wt 149 lb (67.6 kg)   SpO2 (!) 88% Comment: RN notified  BMI 26.39 kg/m  Body mass index is 26.39 kg/m. General: chronically ill-appearing black female who is laying in bed in NAD HEENT: head is normocephalic, atraumatic.  Sclera are noninjected.  PERRL.  Ears and nose without any masses or lesions.  Mouth is pink and dry Heart: regular, rate, and rhythm.  Normal s1,s2. No obvious murmurs, gallops, or rubs noted.   Lungs: CTAB, no wheezes, rhonchi, or rales noted.  Respiratory effort nonlabored.  But decrease breath sounds at the right base. Abd: soft, NT, ND, +BS, no masses, hernias, or organomegaly Psych: A&Ox3 with an appropriate affect.   Labs: Results for orders placed or performed during the hospital encounter of 04/03/2017 (from the past 48 hour(s))  Lactate dehydrogenase     Status: None   Collection Time: 05/15/17  5:48 PM  Result Value Ref Range   LDH 162 98 - 192 U/L  Protein, total     Status: Abnormal   Collection Time: 05/15/17  5:48 PM  Result Value Ref Range   Total Protein 5.3 (L) 6.5 - 8.1 g/dL  CBC     Status: Abnormal   Collection Time: 05/16/17  2:21 AM  Result Value Ref Range   WBC 5.9 4.0 - 10.5 K/uL   RBC 3.81 (L) 3.87 - 5.11 MIL/uL   Hemoglobin 11.0 (L) 12.0 - 15.0 g/dL   HCT 34.2 (L) 36.0 - 46.0 %   MCV 89.8 78.0 - 100.0 fL   MCH 28.9 26.0 - 34.0 pg   MCHC 32.2 30.0 - 36.0 g/dL   RDW 14.6 11.5 - 15.5 %   Platelets 299 150 - 400 K/uL  Basic metabolic panel     Status: Abnormal   Collection Time: 05/16/17  2:21 AM  Result Value Ref Range   Sodium 134 (L) 135 - 145 mmol/L   Potassium 4.3 3.5 - 5.1 mmol/L   Chloride 105 101 - 111 mmol/L   CO2 19 (L) 22 - 32 mmol/L   Glucose, Bld 110 (H) 65 - 99 mg/dL   BUN 42 (H) 6 - 20 mg/dL   Creatinine, Ser 1.81 (H) 0.44 - 1.00 mg/dL   Calcium 9.4 8.9 - 10.3 mg/dL   GFR calc non Af Amer 25 (L) >60 mL/min   GFR calc Af Amer 29 (L) >60 mL/min    Comment: (NOTE) The eGFR has been calculated using  the CKD EPI equation. This calculation has not been validated in all clinical situations. eGFR's persistently <60 mL/min signify possible Chronic Kidney Disease.    Anion gap 10 5 - 15  Basic metabolic panel     Status: Abnormal   Collection Time: 05/17/17  9:52 AM  Result Value Ref Range   Sodium 136 135 - 145 mmol/L   Potassium  3.9 3.5 - 5.1 mmol/L   Chloride 106 101 - 111 mmol/L   CO2 22 22 - 32 mmol/L   Glucose, Bld 103 (H) 65 - 99 mg/dL   BUN 36 (H) 6 - 20 mg/dL   Creatinine, Ser 2.02 (H) 0.44 - 1.00 mg/dL   Calcium 9.5 8.9 - 10.3 mg/dL   GFR calc non Af Amer 22 (L) >60 mL/min   GFR calc Af Amer 25 (L) >60 mL/min    Comment: (NOTE) The eGFR has been calculated using the CKD EPI equation. This calculation has not been validated in all clinical situations. eGFR's persistently <60 mL/min signify possible Chronic Kidney Disease.    Anion gap 8 5 - 15    Imaging: Dg Chest 1 View  Result Date: 05/16/2017 CLINICAL DATA:  Post thoracentesis EXAM: CHEST 1 VIEW COMPARISON:  05/15/2017 FINDINGS: Decreased left pleural effusion with small residual noted. No left pneumothorax. Small moderate right pleural effusion, increased compared to prior radiograph. Bibasilar consolidations with improved aeration of left base. Stable enlarged cardiomediastinal silhouette with atherosclerosis. IMPRESSION: 1. Decreased left pleural effusion with small residual pleural effusion noted. Negative for pneumothorax. Improved aeration of left lung base 2. Small to moderate right pleural effusion, increased compared to prior. Right greater than left bibasilar airspace disease may reflect atelectasis or infiltrate 3. Stable cardiomegaly Electronically Signed   By: Donavan Foil [name]   On: 05/16/2017 15:59   Ir Thoracentesis Asp Pleural Space W/img Guide  Result Date: 05/16/2017 INDICATION: Shortness of breath. Left-sided pleural effusion. Request therapeutic thoracentesis. EXAM: ULTRASOUND GUIDED LEFT  THORACENTESIS MEDICATIONS: None. COMPLICATIONS: None immediate. PROCEDURE: An ultrasound guided thoracentesis was thoroughly discussed with the patient and questions answered. The benefits, risks, alternatives and complications were also discussed. The patient understands and wishes to proceed with the procedure. Written consent was obtained. Ultrasound was performed to localize and mark an adequate pocket of fluid in the left chest. The area was then prepped and draped in the normal sterile fashion. 1% Lidocaine was used for local anesthesia. Under ultrasound guidance a catheter was introduced. Thoracentesis was performed. The catheter was removed and a dressing applied. FINDINGS: A total of approximately 600 mL of clear yellow fluid was removed. IMPRESSION: Successful ultrasound guided left thoracentesis yielding 600 mL of pleural fluid. Read by: Ascencion Dike PA-C Electronically Signed   By: Marybelle Killings [name]   On: 05/16/2017 16:22    Assessment/Plan 1. Recurrent right pleural effusion  Dr. Kathlene Cote has reviewed the patient's imaging.  He will proceed with placement of a right chest drain tomorrow.  She will be NPO p MN.  Her eliquis has been held.  Risks and benefits discussed with the patient including bleeding, infection, damage to adjacent structures. All of the patient's questions were answered, patient is agreeable to proceed. Consent signed and in chart.  Thank you for this interesting consult.  I greatly enjoyed meeting SHEMICKA COHRS and look forward to participating in their care.  A copy of this report was sent to the requesting provider on this date.  Electronically Signed: Henreitta Cea 05/17/2017, 2:11 PM   I spent a total of 40 Minutes    in face to face in clinical consultation, greater than 50% of which was counseling/coordinating care for right recurrent pleural effusion

## 2017-05-17 NOTE — Progress Notes (Signed)
Internal Medicine Attending:   I saw and examined the patient. I reviewed the resident's note and I agree with the resident's findings and plan as documented in the resident's note.  83 year old woman with a prolonged hospital course for heart failure with preserved ejection fraction and class IV symptoms despite medical management. She also has recurrent bilateral pleural effusions related to the heart failure which are causing profound dyspnea with minimal exertion. She is on day 6 of antibiotic treatment for staph epi Pleuryx catheter infection. Plan is for temporary pigtail right chest catheter insertion tomorrow with IR with the goals of symptomatically improving the right-sided large effusion. After 14 days of antibiotics we will have the option to replace the Pleuryx catheter. Over the coming days we are also going to continue addressing goals of care, currently the patient is unwilling to talk with palliative care or address this issue; maybe tomorrow.

## 2017-05-17 NOTE — Progress Notes (Signed)
Paged by nurse that patient is having increased work of breathing.  On arrival patient was leaning forward with non-re breather satting 92-96%.  Patient was alert and responsive.  Ordered stat chest-x-ray, abg and lasix IV 60.   Approximately 30 minutes later paged again by nurse for increased work of breathing, patient had non-rebreather satting 93-96%, patient was alert and responsive.  Patient wanted to take the non-re breather off.  Patient was satting 95% on 6L via [place]. Chest x-ray shows worsening of right sided pleural effusion.  abg showed ph 7.32, pCO2 41 and pO2 44.6.  Discussed case with PCCM for thoracentesis and agreed to see patient.

## 2017-05-17 NOTE — Consult Note (Addendum)
PULMONARY / CRITICAL CARE MEDICINE   Name: [name] MRN: [id number] DOB: March 12, 1932    ADMISSION DATE:  05/01/2017 CONSULTATION DATE: 05/17/17   CHIEF COMPLAINT:  dyspnea  HISTORY OF PRESENT ILLNESS:   This is an 83 year old with diastolic heart failure, and a history of kidney transplant on immunosupressives, as well as a history of chronically recurring bilateral pleural effusions, R > L for which a right sided Pleurx catheter was placed. Pleural fluid cultures from 9/27, 10/1, and 10/7 and grew staph epidermidis; therefore, pleur-x catheter was removed 10/10.  P was placed on cefepime initially then changed to ceftaroline.  She had right thora 10/14 with fluid removed.  Left thora 10/15 with fluid removed.  On afternoon of 10/16, pt developed worsening hypoxia and increased WOB.  Her O2 was initially increased to 6L then pt placed on NRB.  WOB gradually improved and SpO2 improved to 96%.  O2 later decreased to 3L and SpO2 remained in low to mid 90's.  CXR demonstrated mod to large R pleural effusion and small to mod L effusion which were both increased compared to prior imaging.  She was given  lasix and had 200cc of UOP.  ABG showed hypoxia (7.32 / 41 / 45).  PCCM was asked to see in consultation for possibility of repeat thoracentesis.   PAST MEDICAL HISTORY :  She  has a past medical history of Candidal esophagitis (HCC); Cholelithiasis; Chronic diastolic CHF (congestive heart failure) (HCC); CKD (chronic kidney disease), stage III (HCC); Coronary artery disease; Dyspnea; Gastritis; Gout; History of bacteremia; Hypertensive heart disease; Pneumonia; S/P kidney transplant; and Type II diabetes mellitus (HCC).  PAST SURGICAL HISTORY: She  has a past surgical history that includes Kidney transplant (2003); Abdominal hysterectomy; Esophagogastroduodenoscopy (N/A, 03/20/2014); Video bronchoscopy (Bilateral, 05/12/2016); Chest tube insertion (Right, 07/20/2016); Removal of  pleural drainage catheter (Right, 05/29/2017); RIGHT HEART CATH (N/A, 2017/06/03); and IR THORACENTESIS ASP PLEURAL SPACE W/IMG GUIDE (05/16/2017).  Allergies  Allergen Reactions  . Sulfa Drugs Cross Reactors Swelling    No current facility-administered medications on file prior to encounter.    Current Outpatient Prescriptions on File Prior to Encounter  Medication Sig  . amLODipine (NORVASC) 10 MG tablet Take 5 mg by mouth daily.  Marland Kitchen apixaban (ELIQUIS) 2.5 MG TABS tablet Take 1 tablet (2.5 mg total) by mouth 2 (two) times daily. Cancel previous Eliquis prescription  . famotidine (PEPCID) 20 MG tablet Take 20 mg by mouth 2 (two) times daily.  . furosemide (LASIX) 80 MG tablet Take 80 mg by mouth 2 (two) times daily.   . metoprolol tartrate (LOPRESSOR) 50 MG tablet Take 50 mg by mouth 2 (two) times daily.   . mycophenolate (CELLCEPT) 250 MG capsule Take 500 mg by mouth 2 (two) times daily.  Marland Kitchen PROGRAF 1 MG capsule Take 2 mg by mouth 2 (two) times daily.    FAMILY HISTORY:  Her indicated that the status of her father is unknown. She indicated that the status of her brother is unknown. She indicated that the status of her unknown relative is unknown.    SOCIAL HISTORY: She  reports that she quit smoking about 48 years ago. She has never used smokeless tobacco. She reports that she does not drink alcohol or use drugs.  REVIEW OF SYSTEMS:   All negative; except for those that are bolded, which indicate positives.  Constitutional: weight loss, weight gain, night sweats, fevers, chills, fatigue, weakness.  HEENT: headaches, sore throat, sneezing, nasal  congestion, post nasal drip, difficulty swallowing, tooth/dental problems, visual complaints, visual changes, ear aches. Neuro: difficulty with speech, weakness, numbness, ataxia. CV:  chest pain, orthopnea, PND, swelling in lower extremities, dizziness, palpitations, syncope.  Resp: cough, hemoptysis, dyspnea (improving), wheezing. GI:  heartburn, indigestion, abdominal pain, nausea, vomiting, diarrhea, constipation, change in bowel habits, loss of appetite, hematemesis, melena, hematochezia.  GU: dysuria, change in color of urine, urgency or frequency, flank pain, hematuria. MSK: joint pain or swelling, decreased range of motion. Psych: change in mood or affect, depression, anxiety, suicidal ideations, homicidal ideations. Skin: rash, itching, bruising.   SUBJECTIVE:  Feels that breathing has improved after lasix.  Denies increased WOB at this time.  VITAL SIGNS: BP (!) 120/97 (BP Location: Left Arm)   Pulse 78   Temp (!) 97.5 F (36.4 C) (Oral)   Resp 16   Ht  (1.6 m)   Wt 67.6 kg (149 lb)   SpO2 97%   BMI 26.39 kg/m   HEMODYNAMICS:    VENTILATOR SETTINGS:    INTAKE / OUTPUT: I/O last 3 completed shifts: In: 55 [P.O.:90; I.V.:3] Out: 200 [Urine:200]  PHYSICAL EXAMINATION: General: Elderly female, resting in bed, in NAD. Neuro: A&O x 3, non-focal.  HEENT: [place]/AT. PERRL, sclerae anicteric. Cardiovascular: RRR, no M/R/G.  Lungs: Respirations unlabored.  Coarse bilateral bases R > L. Abdomen: BS x 4, soft, NT/ND.  Musculoskeletal: No gross deformities, no edema.  Skin: Intact, warm, no rashes.   LABS:  BMET  Recent Labs Lab 05/14/17 0207 05/16/17 0221 05/17/17 0952  NA 133* 134* 136  K 4.3 4.3 3.9  CL 103 105 106  CO2 20* 19* 22  BUN 44* 42* 36*  CREATININE 1.76* 1.81* 2.02*  GLUCOSE 118* 110* 103*    Electrolytes  Recent Labs Lab 05/14/17 0207 05/16/17 0221 05/17/17 0952  CALCIUM 9.5 9.4 9.5    CBC  Recent Labs Lab 05/19/2017 0300 05/24/2017 0400 05/16/17 0221  WBC 12.3* 8.7 5.9  HGB 12.2 11.3* 11.0*  HCT 36.6 34.1* 34.2*  PLT 307 285 299    Coag's  Recent Labs Lab 05/25/2017 1024  INR 1.57    Sepsis Markers No results for input(s): LATICACIDVEN, PROCALCITON, O2SATVEN in the last 168 hours.  ABG  Recent Labs Lab 05/17/17 1745  PHART 7.320*  PCO2ART  41.8  PO2ART 44.6*    Liver Enzymes No results for input(s): AST, ALT, ALKPHOS, BILITOT, ALBUMIN in the last 168 hours.  Cardiac Enzymes  Recent Labs Lab 05/23/2017 1446 05/07/2017 2041 05/11/2017 0400  TROPONINI 0.14* 0.13* 0.11*    Glucose No results for input(s): GLUCAP in the last 168 hours.  Imaging Dg Chest Port 1 View  Result Date: 05/17/2017 CLINICAL DATA:  Bilateral pleural effusion EXAM: PORTABLE CHEST 1 VIEW COMPARISON:  05/16/2017, 05/15/2017 FINDINGS: Moderate to large right pleural effusion, increased compared to prior radiograph. Small moderate left pleural effusion, slight increased compared to prior radiograph. Bilateral lower lung consolidations. Enlarged cardiomediastinal silhouette with aortic atherosclerosis. No pneumothorax. IMPRESSION: 1. Moderate to large right pleural effusion, increased compared to prior radiograph. Small moderate left pleural effusion, also increased compared to prior radiograph 2. Cardiomegaly with central vascular congestion and probable mild diffuse interstitial edema. 3. Bilateral lower lung atelectasis or pneumonia. Electronically Signed   By: Jasmine Pang [name]   On: 05/17/2017 18:20   STUDIES: Echo 10/9 > EF 50-55% with G3DD. UE duplex 9/30 > bilateral upper extremity DVTs.    ASSESSMENT / PLAN:  Chronic pleural effusions - cultures  positive for staph epi x 3 and pleur-x subsequently removed.  Plans for pigtail catheter to be placed 10/17 by IR. Acute hypoxemic respiratory failure - due to above. Plan: Continue aggressive diuresis as BP and SCr permit. No role emergent thoracentesis tonight - continue with pigtail catheter placement 10/17. Will likely need long term pleur-x replaced after appropriate abx course and cultures negative. cough suppressant Bronchial hygiene. Mobilize as able.  Acute diastolic heart failure - echo from 10/9 showed EF 50-55% with G3DD. PAF - on eliquis. Plan: Continue diuresis. Strict  I/O's. Continue eliquis.  Bilateral UE DVT's. Plan: Continue eliquis.  Rest per primary team.  Agree with pigtail catheter placement by IR tomorrow 10/17 and likely needs Pleur-x replaced after abx course completed and cultures remain negative. Nothing further to add.  PCCM will sign off.  Please do not hesitate to call us back if we can be of any further assistance.   Rutherford Guys, [place] [facility] [place] [facility] Pager: [phone]  or [phone] 05/17/2017, 8:10 PM   STAFF NOTE  I, Dr Newell Coral have personally reviewed patient's available data, including medical history, events of note, physical examination and test results as part of my evaluation. I have discussed with Aura Camps and other care providers such as pharmacist, RN and RRT.  In addition,  I personally evaluated patient at the time of my evaluation, s/p lasix, patient lying in right lateral decubitus awake alert and pleasant not in acute distress nasal cannula at 4L able to speak in full sentences states that if she moves around too much she becomes winded a sign of minimal reserve may need NIPPV if she desaturates currently not desaturating. Acute hypoxemic resp failure improved with IV diuresis.  Given the history of culture positive x 3 and prior PleurX increased risk of bedside thoracentesis also complicated by pt being on Eliquis for Paroxysmal Afib and b/l UE DVTs.  Will not perform thoracentesis this evening. Pt is scheduled for an IR pigtail on  10/17 for recurrent right pleural effusion. Continue current care. Being moved to [place] for closer observation.     Rest per Aura Camps whose note is outlined above and that I agree with.  The patient is critically ill with multiple organ systems failure and requires high complexity decision making for assessment and support, frequent evaluation and titration of therapies, application of advanced monitoring technologies and extensive  interpretation of multiple databases.  Critical Care Time devoted to patient care services described in this note is 45 Minutes. This time reflects time of care of this signee Dr Newell Coral. This critical care time does not reflect procedure time, or teaching time or supervisory time of PA but could involve care discussion time   DISPOSITION: SD CODE STATUS: DNR CC TIME: 45 mins    Dr. Newell Coral [facility]  05/17/2017 9:04 PM

## 2017-05-17 NOTE — Progress Notes (Signed)
Rapid response called for decrease in SATS to the 70's. When I arrived, patient on NRB, SAT 96%, Weaned back to 3 LNC, SAT 96%. Still stating she was SOB, but no distress noted. MD ordered ABG, will be obtained.

## 2017-05-18 ENCOUNTER — Inpatient Hospital Stay (HOSPITAL_COMMUNITY): Payer: Medicare Other

## 2017-05-18 ENCOUNTER — Encounter (HOSPITAL_COMMUNITY): Payer: Self-pay | Admitting: *Deleted

## 2017-05-18 DIAGNOSIS — J9601 Acute respiratory failure with hypoxia: Secondary | ICD-10-CM

## 2017-05-18 LAB — BASIC METABOLIC PANEL
ANION GAP: 11 (ref 5–15)
BUN: 39 mg/dL — ABNORMAL HIGH (ref 6–20)
CALCIUM: 9.7 mg/dL (ref 8.9–10.3)
CO2: 20 mmol/L — ABNORMAL LOW (ref 22–32)
Chloride: 103 mmol/L (ref 101–111)
Creatinine, Ser: 2.21 mg/dL — ABNORMAL HIGH (ref 0.44–1.00)
GFR, EST AFRICAN AMERICAN: 23 mL/min — AB (ref >60)
GFR, EST NON AFRICAN AMERICAN: 19 mL/min — AB (ref >60)
GLUCOSE: 125 mg/dL — AB (ref 65–99)
Potassium: 4.2 mmol/L (ref 3.5–5.1)
Sodium: 134 mmol/L — ABNORMAL LOW (ref 135–145)

## 2017-05-18 LAB — BLOOD GAS, ARTERIAL
Acid-base deficit: 6.7 mmol/L — ABNORMAL HIGH (ref 0.0–2.0)
Bicarbonate: 18.6 mmol/L — ABNORMAL LOW (ref 20.0–28.0)
DELIVERY SYSTEMS: POSITIVE
Drawn by: 345601
Expiratory PAP: 7
FIO2: 70
INSPIRATORY PAP: 14
LHR: 8 {breaths}/min
O2 SAT: 93.2 %
PCO2 ART: 39.4 mmHg (ref 32.0–48.0)
Patient temperature: 98.6
pH, Arterial: 7.296 — ABNORMAL LOW (ref 7.350–7.450)
pO2, Arterial: 72.6 mmHg — ABNORMAL LOW (ref 83.0–108.0)

## 2017-05-18 MED ORDER — FENTANYL CITRATE (PF) 100 MCG/2ML IJ SOLN
INTRAMUSCULAR | Status: AC
  Filled 2017-05-18: qty 2

## 2017-05-18 MED ORDER — LIDOCAINE HCL 1 % IJ SOLN
INTRAMUSCULAR | Status: AC
  Filled 2017-05-18: qty 20

## 2017-05-18 MED ORDER — FENTANYL CITRATE (PF) 100 MCG/2ML IJ SOLN
INTRAMUSCULAR | Status: AC | PRN
  Administered 2017-05-18: 25 ug via INTRAVENOUS

## 2017-05-18 MED ORDER — MIDAZOLAM HCL 2 MG/2ML IJ SOLN
INTRAMUSCULAR | Status: AC | PRN
  Administered 2017-05-18: 1 mg via INTRAVENOUS

## 2017-05-18 MED ORDER — MIDAZOLAM HCL 2 MG/2ML IJ SOLN
INTRAMUSCULAR | Status: AC
  Filled 2017-05-18: qty 2

## 2017-05-18 NOTE — Progress Notes (Signed)
Request RT to bedside  to assess patient she is was unable to maintain oxygen sats on NRB low 80's. I spoke with Dr.Sommers with [facility] he agrees to Bipap.Patient tolerating well and resting while maintains sats in 90's while on Bipap. I will continue to monitor.

## 2017-05-18 NOTE — Progress Notes (Signed)
Nutrition Follow-up  DOCUMENTATION CODES:   Severe malnutrition in context of chronic illness  INTERVENTION:   -D/c Boost Breeze po BID, each supplement provides 250 kcal and 9 grams of protein, due to poor acceptance -Once diet is advanced, increase 30 ml Prostat to TID, each supplement provides 100 kcals and 15 grams of protein -If pt continues to desire aggressive care, may consider initiation of short term nutrition support to help meet nutritional goals  NUTRITION DIAGNOSIS:   Malnutrition (severe) related to chronic illness (CHF) as evidenced by moderate depletion of body fat, severe depletion of muscle mass, energy intake < or equal to 75% for > or equal to 1 month.  Ongoing  GOAL:   Patient will meet greater than or equal to 90% of their needs  Progressing  MONITOR:   PO intake, Supplement acceptance, Labs, I & O's  REASON FOR ASSESSMENT:   Consult Assessment of nutrition requirement/status  ASSESSMENT:   Pt with PMH of CAD, CHF, ESRD prior to renal transplant (2003), Type II DM,  presents with 3 week cough with sputum production, SOB, and fatigue found pleural effusion.   10/14- s/p rt thoracentesis, 900 ml fluid removed 10/15- s/p lt thoracentesis, 600 ml fluid removed 10/16- transferred to SDU, due to respiratory status   Pt currently NPO, awaiting pigtail CT placement by IR today.   Per RN, pt has been refusing medications (will only take medication with juice).   Meal intake continues to be very poor, despite diet liberalization. Meal completion 0%. Pt will typically take Prostat supplement, but has been refusing Boost Breeze. Per IMTS notes, plan to discuss goals of care over the next few days.   Labs reviewed: Na: 134.  Diet Order:  Diet NPO time specified Except for: Sips with Meds  Skin:  Reviewed, no issues  Last BM:  05/18/17  Height:   Ht Readings from Last 1 Encounters:  04/17/2017 5\' 3"  (1.6 m)    Weight:   Wt Readings from Last 1  Encounters:  05/16/17 149 lb (67.6 kg)    Ideal Body Weight:  52.3 kg  BMI:  Body mass index is 26.39 kg/m.  Estimated Nutritional Needs:   Kcal:  1400-1600  Protein:  75-90 grams  Fluid:  >/= 1.4 L/d  EDUCATION NEEDS:   Education needs no appropriate at this time  Bernadette Gores A. Mayford KnifeWilliams, RD, LDN, CDE Pager: [phone] After hours Pager: [phone]

## 2017-05-18 NOTE — Progress Notes (Signed)
PCCM Interval Progress Note  Asked to assess pt at bedside for desaturations. Was on [place] initially then transitioned to NRB due to intermittent desaturations.  Mainly with any movement or as pt was trying to rest / sleep.  Due to recurrent intermittent desats with increased WOB during those times, pt placed on BiPAP.  At the time of my evaluation she is on BiPAP at 12/6.  SpO2 97% and work of breathing normal.  Pt resting comfortably and denies dyspnea or increased WOB.  RN agrees pt looks better and resting comfortably.  Will leave on BiPAP through the night and proceed with planned pigtail catheter in the morning.   Rutherford Guysahul Annalina Needles, GeorgiaPA - C [facility] Pager: [phone]  or [phone] 05/18/2017, 1:29 AM

## 2017-05-18 NOTE — Sedation Documentation (Signed)
Patient is resting comfortably. 

## 2017-05-18 NOTE — Progress Notes (Signed)
eLink Physician-Brief Progress Note Patient Name: [name] DOB: [birth year] MRN: [id number]   Date of Service  05/18/2017  HPI/Events of Note  Desaturation to 84%.  eICU Interventions  Will order: 1. Trial of BiPAP. 2. Ground team requested to evaluate the patient at bedside.  3. ABG after 1 hour on BiPAP.     Intervention Category Major Interventions: Hypoxemia - evaluation and management  [name] 05/18/2017, 1:19 AM

## 2017-05-18 NOTE — Procedures (Signed)
Interventional Radiology Procedure Note  Procedure: CT guided placement of right pleural drainage catheter  Complications: None  Estimated Blood Loss: None  12 Fr pigtail drain placed in right pleural space with good return of pleural fluid.  Attached to [employer] [facility]. Will connect to wall suction.  Jodi MarbleGlenn T. Fredia SorrowYamagata, M.D Pager:  [phone]

## 2017-05-18 NOTE — Progress Notes (Signed)
Subjective:  Patient was seen sitting uncomfortably in bed with NRB mask in place. She had tachypnea but stated that she felt OK while on the mask. She was asking for us to take of the mask because she felt like her breathing improved. She was excited to have her pleural fluid drained today and thinks that this will significantly help her breathing.  Objective:  Vital signs in last 24 hours: Vitals:   05/18/17 0255 05/18/17 0349 05/18/17 0833 05/18/17 0848  BP:  104/60  (!) 115/58  Pulse: 75 73 90 85  Resp: (!) 28 (!) 22 (!) 22 20  Temp:  (!) 97.3 F (36.3 C)  97.9 F (36.6 C)  TempSrc:  Axillary  Axillary  SpO2: 95% 94% 98% 94%  Weight:      Height:       Physical Exam  Constitutional:  Thin elderly chronically sick appearing woman with NRB in place breathing fast but comfortably.  HENT:  Oropharynx dry with cracked lips.  Cardiovascular: Normal rate and regular rhythm.   No murmur heard. Pulmonary/Chest:  Some accessory muscle use. No nasal flaring. Decreased breath sounds on R hemithorax to shoulder. Crackles at L lung base with good aeration throughout the rest of the lung fields.   Abdominal: Soft. She exhibits no distension. There is no tenderness.  Musculoskeletal: She exhibits no edema (of BLE) or tenderness (of BLE).  Skin: Skin is warm and dry. No rash noted. No erythema.   Assessment/Plan:  Principal Problem:   Pleural effusion, bilateral, recurrent Active Problems:   History of renal transplant   CAP (community acquired pneumonia)   Fatigue   Shortness of breath   Cough with sputum   Protein-calorie malnutrition (HCC)   Personal history of immunosupression therapy   Anorexia   Staphylococcus epidermidis infection   CKD (chronic kidney disease), symptom management only, stage 4 (severe) (HCC)  [name] is an 83 yo with PMH of T2DM, CKD s/p kidney transplant in 2003 on immunosuppression, diastolic CHF, and CAD who presented on April 27, 2017 with increased  productive cough, worsening SOB, and progressively worsening malaise. The patient was admitted to the internal medicine teaching service for management. The specific problems addressed during this admission are as follows:  Chronic bilateralpleural fluid effusions complicated by indwelling catheter colonization:Patient developed acute respiratory distress yesterday afternoon into last night, eventually requiring transfer to stepdown and transition to BiPAP. Patient had minimal response to lasix last night and will still plan for R thoracentesis with drain placement today, which will hopefully provide relief of symptoms and increased oxygen requirement.  -IR with pigtail placement and R effusion drainage today today -Continue renal dosing of ceftaroline 300 mg q12 hours, day 7/14 -Pleural fluid cultures from R thoracentesis no growth to date (48 hours) -Cytology of pleural fluid from R thoracentesis pending  Chronic HFpEF (grade 3 diastolic dysfunction 05/10/2017 echo) with pulmonary HTN: Patient's echocardiography on 05/10/2017 showed worsened diastolic dysfunction from that previously measured in 01/2017. Patient no longer responding well to IV diuresis likely 2/2 decreased PO intake. Given POCUS which shows worsening of effusions with minimal pulmonary edema, SOB likely a result of effusions discussed above rather than acute CHF exacerbation.  -Continue strict I&Os  -Hold Lasix today, reevaluate tomorrow -Continue daily BMP while on lasix  Acute on chronic CKD stage IVs/p renal transplant with current immune suppression: Cr has been stable around 1.6-1.8 during hospitalization. Increased to 2.21 today. Most likely a result of decreased PO intake as patient shows clinical  signs of dehydration on exam and decreased urine output over the past few days. Will continue to encourage PO intake -Hold PO lasix today  Progressive fatigue and anorexia: Malignancy work up, including CT of neck and chest  along with MRI abdomen and pelvis, has been negative and most likely cause worsening of multiple chronic medical conditions. -Will continue to discuss palliative care consult with patient -PT/OT signed off 2/2 lack of patient participation in the setting of worsening fatigue  Paroxysmal ZO:XWRU controlled with metoprolol 50 mg BID at home and on Eliquis 2.5 mg BID for chronic anticoagulation. Holding home metoprolol 50 mg BID as patient had acute CHF exacerbation during hospitalization.  -Eliquis 2.5mg  BID on hold pending discussion with IR regarding placement of R pleural drain -Continue telemetry while inpatient  Fluids: None Diet:Heart healthy, nutrition supplementation VTE prophylaxis: Holding Eliquis 2.5mg  BID pending possible IR procedures, SCDs while in bed Code:DNR  Dispo: Anticipated discharge pending clinical improvement.  Joy Lesches, MD 05/18/2017, 11:15 AM Pager: [phone]

## 2017-05-18 NOTE — Progress Notes (Signed)
PULMONARY / CRITICAL CARE MEDICINE   Name: [name] MRN: [id number] DOB: [birth year]    ADMISSION DATE:  04/18/2017 CONSULTATION DATE: 05/17/17   CHIEF COMPLAINT:  dyspnea  HISTORY OF PRESENT ILLNESS:   This is an 83 year old with diastolic heart failure, and a history of kidney transplant on immunosupressives, as well as a history of chronically recurring bilateral pleural effusions, R > L for which a right sided Pleurx catheter was placed. Pleural fluid cultures from 9/27, 10/1, and 10/7 and grew staph epidermidis; therefore, pleur-x catheter was removed 10/10.  P was placed on cefepime initially then changed to ceftaroline.  She had right thora 10/14 with 900ml fluid removed.  Left thora 10/15 with 600ml fluid removed.  On afternoon of 10/16, pt developed worsening hypoxia and increased WOB.  Her O2 was initially increased to 6L then pt placed on NRB.  WOB gradually improved and SpO2 improved to 96%.  O2 later decreased to 3L and SpO2 remained in low to mid 90's.  CXR demonstrated mod to large R pleural effusion and small to mod L effusion which were both increased compared to prior imaging.  She was given 60mg  lasix and had 200cc of UOP.  ABG showed hypoxia (7.32 / 41 / 45).  PCCM was asked to see in consultation for possibility of repeat thoracentesis.   SUBJECTIVE: Increased WOB overnight with associated desaturations, which required BIPAP. She did not tolerate this well and felt as though there was minimal benefit. Maintained overnight on 15L NRB. Currently breathing comfortably on NRB as long as she "stays still".   VITAL SIGNS: BP 104/60 (BP Location: Right Arm)   Pulse 73   Temp (!) 97.3 F (36.3 C) (Axillary)   Resp (!) 22   Ht 5\' 3"  (1.6 m)   Wt 67.6 kg (149 lb)   SpO2 94%   BMI 26.39 kg/m   HEMODYNAMICS:    VENTILATOR SETTINGS:    INTAKE / OUTPUT: I/O last 3 completed shifts: In: 6 [I.V.:6] Out: 400 [Urine:400]  PHYSICAL EXAMINATION: General: Elderly female  on NRB in NAD. Faint voice. Neuro: Alert, oriented, non-focal HEENT: /AT. PERRL, sclerae anicteric. Cardiovascular: RRR, no M/R/G.  Lungs: Respirations mildly labored. Diminished bases.  Abdomen: BS x 4, soft, NT/ND.  Musculoskeletal: No gross deformities, no edema.  Skin: Intact, warm, no rashes.    LABS:  BMET  Recent Labs Lab 05/16/17 0221 05/17/17 0952 05/18/17 0233  NA 134* 136 134*  K 4.3 3.9 4.2  CL 105 106 103  CO2 19* 22 20*  BUN 42* 36* 39*  CREATININE 1.81* 2.02* 2.21*  GLUCOSE 110* 103* 125*    Electrolytes  Recent Labs Lab 05/16/17 0221 05/17/17 0952 05/18/17 0233  CALCIUM 9.4 9.5 9.7    CBC  Recent Labs Lab 05/13/2017 0400 05/16/17 0221  WBC 8.7 5.9  HGB 11.3* 11.0*  HCT 34.1* 34.2*  PLT 285 299    Coag's  Recent Labs Lab 05/19/2017 1024  INR 1.57    Sepsis Markers No results for input(s): LATICACIDVEN, PROCALCITON, O2SATVEN in the last 168 hours.  ABG  Recent Labs Lab 05/17/17 1745 05/18/17 0250  PHART 7.320* 7.296*  PCO2ART 41.8 39.4  PO2ART 44.6* 72.6*    Liver Enzymes No results for input(s): AST, ALT, ALKPHOS, BILITOT, ALBUMIN in the last 168 hours.  Cardiac Enzymes  Recent Labs Lab 05/27/2017 1446 05/10/2017 2041 05/16/2017 0400  TROPONINI 0.14* 0.13* 0.11*    Glucose No results for input(s): GLUCAP in the last  168 hours.  Imaging Dg Chest Port 1 View  Result Date: 05/18/2017 CLINICAL DATA:  83 y/o  F; bilateral pleural effusions. EXAM: PORTABLE CHEST 1 VIEW COMPARISON:  05/17/2017 chest radiograph. FINDINGS: Diminished moderate right and small left pleural effusions and bibasilar opacities. Aortic atherosclerosis with calcification. Stable cardiomegaly partially obscured by pleural effusions. Bones are unremarkable. IMPRESSION: Moderate right and small left pleural effusions are decreased. Associated bibasilar opacities may represent atelectasis or pneumonia. Electronically Signed   By: Mitzi Hansen  [name]   On: 05/18/2017 06:32   Dg Chest Port 1 View  Result Date: 05/17/2017 CLINICAL DATA:  Bilateral pleural effusion EXAM: PORTABLE CHEST 1 VIEW COMPARISON:  05/16/2017, 05/15/2017 FINDINGS: Moderate to large right pleural effusion, increased compared to prior radiograph. Small moderate left pleural effusion, slight increased compared to prior radiograph. Bilateral lower lung consolidations. Enlarged cardiomediastinal silhouette with aortic atherosclerosis. No pneumothorax. IMPRESSION: 1. Moderate to large right pleural effusion, increased compared to prior radiograph. Small moderate left pleural effusion, also increased compared to prior radiograph 2. Cardiomegaly with central vascular congestion and probable mild diffuse interstitial edema. 3. Bilateral lower lung atelectasis or pneumonia. Electronically Signed   By: Jasmine Pang [name]   On: 05/17/2017 18:20   STUDIES: Echo 10/9 > EF 50-55% with G3DD. UE duplex 9/30 > bilateral upper extremity DVTs.    ASSESSMENT / PLAN:  Chronic pleural effusions - cultures positive for staph epi x 3 and pleur-x subsequently removed.  Plans for pigtail catheter to be placed 10/17 by IR. Acute hypoxemic respiratory failure - due to above. Plan: Diuresis per primary team. SCr rise again today, maybe limit reached.  Awaiting pigtail CT placement by IR this AM Continue ABX per primary (ceftaroline 10/9 >>>) Will likely need long term pleur-x replaced after appropriate abx course and cultures negative. Bronchial hygiene. Mobilize as able.  Acute diastolic heart failure - echo from 10/9 showed EF 50-55% with G3DD. PAF - on eliquis. Plan: Diuresis per primary Strict I/O's Hold eliquis for CT placement  Bilateral UE DVT's. Plan: Hold eliquis for CT placement  Joneen Roach, AGACNP-BC [facility] Pager [phone] or [phone]  05/18/2017 8:13 AM     STAFF NOTE: Cindi Carbon, MD FACP have personally reviewed  patient's available data, including medical history, events of note, physical examination and test results as part of my evaluation. I have discussed with resident/NP and other care providers such as pharmacist, RN and RRT. In addition, I personally evaluated patient and elicited key findings of: moderate distress, JVD high, tripoding at times, reduced BS apical and ronchi bases, pcxr which I reviewed shows worsening effusions with chest tube out, she has had infected catheter removed, and thora done and requires new pigtail as ordered for CT guidance, need this to occur asap, I again had an extesnive d/w her and she does nto want intubation under any circumstances even if would result in death, she also does not want acls cpr, her abg is concerning, she requires NIMV at minimum now to support her best we can in hopes to get catheter placed and improved resp status, have ordered BIPAP again and slight lower pressures inhopes that she would tolerate this, I wold hold further lasix with crt rise, we may also need to increase o2 support with pao2 72 on 70% and now higher, I updated pt and daughter in room, daughter was present for and supported DNR status, can stay in SDU, will follow The patient is critically ill with multiple organ  systems failure and requires high complexity decision making for assessment and support, frequent evaluation and titration of therapies, application of advanced monitoring technologies and extensive interpretation of multiple databases.   Critical Care Time devoted to patient care services described in this note is 30 Minutes. This time reflects time of care of this signee: Rory Percy, MD FACP. This critical care time does not reflect procedure time, or teaching time or supervisory time of PA/NP/Med student/Med Resident etc but could involve care discussion time. Rest per NP/medical resident whose note is outlined above and that I agree with   Mcarthur Rossetti. Tyson Alias, MD,  FACP [name]: [phone] [facility] 05/18/2017 10:23 AM

## 2017-05-18 NOTE — Progress Notes (Signed)
Pt family leaving.  Family requesting pain medications for patient.  Tylenol given at 2001.  Assessing patient for pain.  pt opens eyes to verbal stimuli but falling back asleep.  Patient not able to verbalize pain level at the current moment.  Pt face relaxed, no grimacing noted or vocalizations noted for concern for pain. RR normal and unlabored. No additional interventions for pain needed at this time.

## 2017-05-18 NOTE — Plan of Care (Signed)
Problem: Activity: Goal: Risk for activity intolerance will decrease Outcome: Progressing Pt tolerated Bipap with encouragement.   Problem: Bowel/Gastric: Goal: Will not experience complications related to bowel motility Outcome: Not Progressing Pt remained NPO all shift. Pt bowel sounds hypoactive to active.   Problem: Activity: Goal: Ability to return to baseline activity level will improve Outcome: Progressing Pt got up to side of bed 3+ times today. Pt found it easier to breathe sitting at side of bed.

## 2017-05-18 NOTE — Progress Notes (Signed)
Internal Medicine Attending:   I saw and examined the patient. I reviewed the resident's note and I agree with the resident's findings and plan as documented in the resident's note.  83 year old woman with a prolonged complicated hospital stay for advanced heart failure with preserved ejection fraction and class IV symptoms and bilateral recurrent pleural effusions of unclear etiology. Her clinical condition worsened overnight, she developed acute hypoxic respiratory failure. Likely this is due to reaccumulation of her pleural effusion, even though she had thoracenteses done 1 day ago. She required noninvasive positive pressure ventilation, however she does not like wearing the mask and does not want to go back on it today. On exam she appears in distress. On ultrasound she has a large right pleural effusion and moderate left pleural effusion, a few pulmonary B lines in the right upper lobe, IVC was thin and collapsing with respiration, heart looked hyperdynamic with severely thickened ventricles.   I think her respiratory distress is being driven mostly by the enlarging pleural effusions with the advanced HFpEF contributing. Plan is for drainage of the right pleural effusion with pig-tail temporary catheter today. Continue with as needed BiPAP for hypoxia. Her overall condition continues to deteriorate despite our best efforts. I am hopeful if we can stabilize her respiratory status today, it will give us time to re-engage her in goals of care discussions.

## 2017-05-19 ENCOUNTER — Inpatient Hospital Stay (HOSPITAL_COMMUNITY): Payer: Medicare Other

## 2017-05-19 DIAGNOSIS — R10813 Right lower quadrant abdominal tenderness: Secondary | ICD-10-CM

## 2017-05-19 DIAGNOSIS — N178 Other acute kidney failure: Secondary | ICD-10-CM

## 2017-05-19 DIAGNOSIS — R10814 Left lower quadrant abdominal tenderness: Secondary | ICD-10-CM

## 2017-05-19 DIAGNOSIS — J9621 Acute and chronic respiratory failure with hypoxia: Secondary | ICD-10-CM

## 2017-05-19 LAB — BASIC METABOLIC PANEL
ANION GAP: 13 (ref 5–15)
BUN: 48 mg/dL — ABNORMAL HIGH (ref 6–20)
CO2: 16 mmol/L — AB (ref 22–32)
Calcium: 9.5 mg/dL (ref 8.9–10.3)
Chloride: 105 mmol/L (ref 101–111)
Creatinine, Ser: 3.28 mg/dL — ABNORMAL HIGH (ref 0.44–1.00)
GFR calc Af Amer: 14 mL/min — ABNORMAL LOW (ref >60)
GFR, EST NON AFRICAN AMERICAN: 12 mL/min — AB (ref >60)
GLUCOSE: 114 mg/dL — AB (ref 65–99)
POTASSIUM: 4.8 mmol/L (ref 3.5–5.1)
Sodium: 134 mmol/L — ABNORMAL LOW (ref 135–145)

## 2017-05-19 MED ORDER — TRAMADOL HCL 50 MG PO TABS
50.0000 mg | ORAL_TABLET | Freq: Once | ORAL | Status: AC
  Administered 2017-05-19: 50 mg via ORAL
  Filled 2017-05-19: qty 1

## 2017-05-19 MED ORDER — SODIUM CHLORIDE 0.9 % IV SOLN
INTRAVENOUS | Status: AC
  Administered 2017-05-19: 18:00:00 via INTRAVENOUS

## 2017-05-19 MED ORDER — LORAZEPAM 2 MG/ML IJ SOLN
0.5000 mg | Freq: Four times a day (QID) | INTRAMUSCULAR | Status: DC | PRN
  Administered 2017-05-20 (×2): 0.5 mg via INTRAVENOUS
  Filled 2017-05-19 (×2): qty 1

## 2017-05-19 MED ORDER — FAMOTIDINE 20 MG PO TABS: 20.0000 mg | ORAL_TABLET | Freq: Every day | ORAL | Status: DC | PRN

## 2017-05-19 MED ORDER — HYDROMORPHONE HCL 1 MG/ML IJ SOLN
0.5000 mg | INTRAMUSCULAR | Status: DC | PRN
  Administered 2017-05-19: 0.5 mg via INTRAVENOUS
  Filled 2017-05-19: qty 0.5

## 2017-05-19 MED ORDER — SIMETHICONE 80 MG PO CHEW
80.0000 mg | CHEWABLE_TABLET | Freq: Once | ORAL | Status: DC
  Filled 2017-05-19: qty 1

## 2017-05-19 MED ORDER — PROMETHAZINE HCL 25 MG/ML IJ SOLN: 12.5000 mg | Freq: Four times a day (QID) | INTRAMUSCULAR | Status: DC | PRN

## 2017-05-19 MED ORDER — SODIUM CHLORIDE 0.9 % IV BOLUS (SEPSIS)
500.0000 mL | Freq: Once | INTRAVENOUS | Status: AC
  Administered 2017-05-19: 500 mL via INTRAVENOUS

## 2017-05-19 NOTE — Progress Notes (Signed)
RN called RT stating pt was vomiting. RT not placing pt back on bipap at this time, pt currently on non rebreather, vital signs stable. RT will continue to monitor.

## 2017-05-19 NOTE — Progress Notes (Signed)
Pt vomiting x 2 unable to place back on bipap at this time due to vomiting.  Pt on 100% NRB maintaining oxygen saturations.  Pt with c/o worsening abdominal pain extending to back after PO tramadol given.  Pt noted to be rocking back and forth in bed stating "I can't get comfortable."  Eddie CandleJ. Huang, MD notified.  VS as below:    05/19/17 0204  Vitals  BP (!) 82/62  MAP (mmHg) 70  Pulse Rate 69  ECG Heart Rate 68  Resp (!) 26  Oxygen Therapy  SpO2 99 %

## 2017-05-19 NOTE — Progress Notes (Signed)
PULMONARY / CRITICAL CARE MEDICINE   Name: [name] MRN: [id number] DOB: 26-Nov-1931    ADMISSION DATE:  04/21/2017 CONSULTATION DATE: 05/17/17   CHIEF COMPLAINT:  dyspnea  HISTORY OF PRESENT ILLNESS:   This is an 83 year old with diastolic heart failure, and a history of kidney transplant on immunosupressives, as well as a history of chronically recurring bilateral pleural effusions, R > L for which a right sided Pleurx catheter was placed. Pleural fluid cultures from 9/27, 10/1, and 10/7 and grew staph epidermidis; therefore, pleur-x catheter was removed 10/10.  P was placed on cefepime initially then changed to ceftaroline.  She had right thora 10/14 with 900ml fluid removed.  Left thora 10/15 with 600ml fluid removed.  On afternoon of 10/16, pt developed worsening hypoxia and increased WOB.  Her O2 was initially increased to 6L then pt placed on NRB.  WOB gradually improved and SpO2 improved to 96%.  O2 later decreased to 3L and SpO2 remained in low to mid 90's.  CXR demonstrated mod to large R pleural effusion and small to mod L effusion which were both increased compared to prior imaging.  She was given 60mg  lasix and had 200cc of UOP.  ABG showed hypoxia (7.32 / 41 / 45).  PCCM was asked to see in consultation for possibility of repeat thoracentesis. Stayed on for resp distress post rt chest tube(2300 cc rt effusion improved) with continued resp distress on 60% NIMVS.     SUBJECTIVE:On nimvs, received phenergan for nausea. Difficult to arouse. Sats 96% on 60% fio2. Hold sedation, try off nimvs, consider bicarb and renal consult. Rt chest tube with 2300 cc output. May be approaching futile care.  VITAL SIGNS: BP (!) 106/54   Pulse 65   Temp (!) 97.2 F (36.2 C) (Axillary)   Resp (!) 25   Ht 5\' 3"  (1.6 m)   Wt 147 lb 14.9 oz (67.1 kg)   SpO2 100%   BMI 26.20 kg/m   HEMODYNAMICS:    VENTILATOR SETTINGS: FiO2 (%):  [60 %-100 %] 60 %  INTAKE / OUTPUT: I/O last 3  completed shifts: In: 269 [I.V.:19; IV Piggyback:250] Out: 2830 [Urine:530; Chest Tube:2300]  PHYSICAL EXAMINATION: General:  Frail elderly female on NIMVS and difficult to arouse HEENT: No JVD NWG:NFAOPSY:Dull affect Neuro: follow commands when stimulated, note phenergan given  CV: HSD PULM: NIMVS, in place, Vt 234 cc, 60% fio2 sats 99%, Pco2 39, 7.29 ZH:YQMVGI:soft, non-tender, bsx4 active  Extremities: warm/dry, ++ edema  Skin: no rashes or lesions   LABS:  BMET  Recent Labs Lab 05/16/17 0221 05/17/17 0952 05/18/17 0233  NA 134* 136 134*  K 4.3 3.9 4.2  CL 105 106 103  CO2 19* 22 20*  BUN 42* 36* 39*  CREATININE 1.81* 2.02* 2.21*  GLUCOSE 110* 103* 125*    Electrolytes  Recent Labs Lab 05/16/17 0221 05/17/17 0952 05/18/17 0233  CALCIUM 9.4 9.5 9.7    CBC  Recent Labs Lab 05/16/17 0221  WBC 5.9  HGB 11.0*  HCT 34.2*  PLT 299    Coag's  Recent Labs Lab 06/01/2017 1024  INR 1.57    Sepsis Markers No results for input(s): LATICACIDVEN, PROCALCITON, O2SATVEN in the last 168 hours.  ABG  Recent Labs Lab 05/17/17 1745 05/18/17 0250  PHART 7.320* 7.296*  PCO2ART 41.8 39.4  PO2ART 44.6* 72.6*    Liver Enzymes No results for input(s): AST, ALT, ALKPHOS, BILITOT, ALBUMIN in the last 168 hours.  Cardiac Enzymes No  results for input(s): TROPONINI, PROBNP in the last 168 hours.  Glucose No results for input(s): GLUCAP in the last 168 hours.  Imaging Dg Chest Port 1 View  Result Date: 05/19/2017 CLINICAL DATA:  83 y/o  F; thoracentesis with abdominal pain. EXAM: PORTABLE CHEST 1 VIEW PORTABLE ABDOMEN 1 VIEW COMPARISON:  05/18/2017 chest radiograph. 05/18/2017 CT abdomen and pelvis. FINDINGS: Chest: Stable cardiomegaly. Right pleural drain placement with diminution in right pleural effusion, small residual. Increased moderate left pleural effusion. Left basilar opacity probably represents associated atelectasis. Aortic atherosclerosis with calcification. No  acute osseous abnormality identified. Abdomen: Nonobstructive bowel gas pattern. No radiopaque urinary stone disease identified. Bones are unremarkable. Aortic atherosclerosis with calcification. IMPRESSION: Right pleural drain placement with decreased right-sided effusion, small residual. Increase moderate left effusion. Left basilar opacity probably represents associated atelectasis. Pneumonia is not excluded. Stable cardiomegaly and aortic atherosclerosis. Nonobstructive bowel gas pattern. Electronically Signed   By: Mitzi Hansen [name]   On: 05/19/2017 03:23   Dg Abd Portable 1v  Result Date: 05/19/2017 CLINICAL DATA:  83 y/o  F; thoracentesis with abdominal pain. EXAM: PORTABLE CHEST 1 VIEW PORTABLE ABDOMEN 1 VIEW COMPARISON:  05/18/2017 chest radiograph. 05/18/2017 CT abdomen and pelvis. FINDINGS: Chest: Stable cardiomegaly. Right pleural drain placement with diminution in right pleural effusion, small residual. Increased moderate left pleural effusion. Left basilar opacity probably represents associated atelectasis. Aortic atherosclerosis with calcification. No acute osseous abnormality identified. Abdomen: Nonobstructive bowel gas pattern. No radiopaque urinary stone disease identified. Bones are unremarkable. Aortic atherosclerosis with calcification. IMPRESSION: Right pleural drain placement with decreased right-sided effusion, small residual. Increase moderate left effusion. Left basilar opacity probably represents associated atelectasis. Pneumonia is not excluded. Stable cardiomegaly and aortic atherosclerosis. Nonobstructive bowel gas pattern. Electronically Signed   By: Mitzi Hansen [name]   On: 05/19/2017 03:23   Ct Image Guided Drainage By Percutaneous Catheter  Result Date: 05/18/2017 CLINICAL DATA:  Recurrent right pleural effusion and status post recent removal of infected PleurX catheter. Request has been made to place a non tunneled pleural drainage catheter  currently to manage the pleural effusion. EXAM: CT GUIDED CATHETER DRAINAGE OF RIGHT PLEURAL EFFUSION ANESTHESIA/SEDATION: 1.0 mg IV Versed 25 mcg IV Fentanyl Total Moderate Sedation Time:  30 minutes The patient's level of consciousness and physiologic status were continuously monitored during the procedure by Radiology nursing. PROCEDURE: The procedure, risks, benefits, and alternatives were explained to the patient. Questions regarding the procedure were encouraged and answered. The patient understands and consents to the procedure. A time out was performed prior to initiating the procedure. The right chest wall was prepped with chlorhexidine in a sterile fashion, and a sterile drape was applied covering the operative field. A sterile gown and sterile gloves were used for the procedure. Local anesthesia was provided with 1% Lidocaine. Under CT guidance, an 18 gauge trocar needle was advanced into the right pleural space. After confirming needle tip position, a guidewire was advanced into the pleural space. The tract was dilated over the wire and a 12 French pigtail drainage catheter advanced over the wire. Drainage catheter position was confirmed by CT. The catheter was secured at the skin with a Prolene retention suture and StatLock device. The catheter was connected to a [place] Pleur-Evac device. COMPLICATIONS: None FINDINGS: CT demonstrates a moderate to large recurrent right pleural effusion and a smaller left pleural effusion. After placement of the drainage catheter, there is excellent return of pleural fluid. IMPRESSION: CT-guided placement of 12 French drainage catheter in right pleural  space to treat a recurrent right pleural effusion. Electronically Signed   By: Irish Lack [name]   On: 05/18/2017 17:00   STUDIES: Echo 10/9 > EF 50-55% with G3DD. UE duplex 9/30 > bilateral upper extremity DVTs.    ASSESSMENT / PLAN:  Chronic pleural effusions - cultures positive for staph epi x 3 and pleur-x  subsequently removed.  Plans for pigtail catheter to be placed 10/17 by IR. Acute hypoxemic respiratory failure - due to above. Metabolic acidosis  Bilateral UE dvt Cannot ro PE but unable to do ct chest with contrast Plan: Diuresis per primary team. SCr continues to rise  pigtail CT placement by IR 10/17 with 2300 cc obtained Continue ABX per primary (ceftaroline 10/9 >>>) Will likely need long term pleur-x replaced after appropriate abx course and cultures negative. Currently on bipap with fio2 60%, VT 234 cc with metabolic acidosis with Co2 20 and chronic renal failure post transplant. Consider bicarb supplement. Consider renal consult Try off bipap 10/18 Resume ANTICOAGULATION   Acute diastolic heart failure - echo from 10/9 showed EF 50-55% with G3DD. PAF - on eliquis. Lab Results  Component Value Date   CREATININE 2.21 (H) 05/18/2017   CREATININE 2.02 (H) 05/17/2017   CREATININE 1.81 (H) 05/16/2017    Plan: Diuresis per primary, note increased creatine Strict I/O's Hold eliquis for CT placement  Bilateral UE DVT's. Plan: Resume eliquis per primary team. Rt chest tube placed 10/17  [facility] [place] [place] ACNP Adolph Pollack PCCM Pager [phone] till 3 pm If no answer page [phone] 05/19/2017, 8:26 AM

## 2017-05-19 NOTE — Progress Notes (Signed)
Patient's family requested that patient be able to eat. Patient is minimally responsive at this time. RN paged Caron PresumeHelberg, MD. MD requested that we hold of on feeding until we determine if patient is going to transition to full comfort care. RN will continue to monitor.

## 2017-05-19 NOTE — Significant Event (Signed)
Primary team held family meeting regarding the patient's overall clinical course and recent decompensation. Patient's daughter and granddaughter, who are her primary caregivers in the area, were present for discussion regarding patient's overall prognosis.   The family was informed about patient's worsening heart failure, acute respiratory failure, and new onset kidney failure. They understood the seriousness of the patient's condition and reiterated the patient's previous wishes to remain DNR/DNI.   At this time they want to keep patient on stepdown unit until more family members can be called to be beside. They would like to continue use of BiPAP overnight, so that the patient has use of this device if she develops worsening respiratory distress in the next 12-24 hours if she can tolerate it. They would like to begin the transition to comfort care this afternoon with use of PRN pain and anxiolytic medications when the patient requests them. They will likely transition to a more comfortable room and discontinue BiPAP with full comfort measures once more family arrive bedside, hopefully within the next 24 hours.   Will add PRN dilaudid and lorazepam to patient's medication regimen. Plan to continue respiratory support with use of BiPAP or NRB per family wishes (whichever makes the patient more comfortable). Will discontinue use of long-term medications for management of chronic medical conditions per family wishes.

## 2017-05-19 NOTE — Consult Note (Signed)
We have been asked to see this patient for AKI in setting of long standing ddKT on MMF/Tac.  Pt with prolonged hospital stay with recurrent pleural effusions, probably infected with S epi.  She had R chest tube but placed and 2.3L output yesterday.    SCr has increased to 3.28 from baseline 1.8 - 2.0.  UOP has been scant.  She also is with FTT, resp failure with need for BIPAP. Primary service has discussed with daughter and granddaughter and patient is transitioning to comfort measures over next 24h.  DIscussed with family and pt examined, breathing comfortably on NRB.  We celebrated a kidney that lasted 15years.    I suspect the recent change in renal function is from hypovolemia from chest tube drainage.    I recommend continuation of IVFs for now.  If she is awake enough to take her IS I don't see any reason to withhold until fully comfort mediated.    We will not actively follow, but please call if further questions or changes occur.

## 2017-05-19 NOTE — Progress Notes (Signed)
Subjective:  [name] was seen laying comfortably in bed with BiPAP mask in place. She was breathing comfortably and responsive to questions but did not open her eyes for discussion.  Objective:  Vital signs in last 24 hours: Vitals:   05/19/17 0204 05/19/17 0218 05/19/17 0339 05/19/17 0355  BP: (!) 82/62 (!) 100/55 (!) 106/54   Pulse: 69 66 65 65  Resp: (!) 26 16 (!) 26 (!) 25  Temp:      TempSrc:      SpO2: 99% 100% 100% 100%  Weight:      Height:       Physical Exam  Constitutional:  Elderly, chronically sick appearing woman laying in bed with BiPAP on in no acute distress  Cardiovascular: Normal rate, regular rhythm and intact distal pulses.   No murmur heard. Pulmonary/Chest:  BiPAP in place. Chest tube in place draining 300 mL serosanguinous fluid. Anterior R Lung clear to auscultation, interval improvement from previous exams. Anterior L lung with crackles and crackles at left lung base, similar to previous exams.  Abdominal: Soft. She exhibits no distension. There is tenderness (With palpation of RLQ and LLQ; mainly in suprapubic area).  Musculoskeletal: She exhibits no edema (of bilateral lower extremities) or tenderness (of bilateral lower extremities).  Neurological:  Patient responsive, shaking head yes or no to questions, but does not open eyes to voice  Skin: Skin is warm and dry. No rash noted. No erythema.   Assessment/Plan:  Principal Problem:   Acute hypoxemic respiratory failure (HCC) Active Problems:   History of renal transplant   Pleural effusion, bilateral, recurrent   CAP (community acquired pneumonia)   Fatigue   Shortness of breath   Cough with sputum   Protein-calorie malnutrition (HCC)   Personal history of immunosupression therapy   Anorexia   Staphylococcus epidermidis infection   CKD (chronic kidney disease), symptom management only, stage 4 (severe) (HCC)  [name] is an 83 yo with PMH of T2DM, CKD s/p kidney transplant in 2003 on  immunosuppression, diastolic CHF, and CAD who presented on 04/10/2017 with increased productive cough, worsening SOB, and progressively worsening malaise. The patient was admitted to the internal medicine teaching service for management. The specific problems addressed during this admission are as follows:  Acute hypoxic respiratory failure 2/2 chronic bilateralpleural fluid effusions:Patient had R chest tube placed with IR yesterday and it has been draining serosanguinous fluid (>2L yesterday) ever since. The patient appears to be more comfortable on BiPAP today than prior to tube placement, however she is no longer answering questions or opening her eyes for discussions regarding care with primary team. Will plan for goals of care discussion today with family between 1-2pm (daughter to page primary team when she is bedside). -Continue BiPAP per respiratory therapy/PCCM -Continue renal dosing of ceftaroline 300 mg q12 hours, day 8/14 -Pleural fluid cultures from R thoracentesis no growth to date (72 hours) -Cytology of pleural fluid shows no malignant cells -PRN dilaudid 0.5 mg for pain 2/2 chest tube placment  Oliguric renal failures/p renal transplant with current immune suppression: Increased to 3.28 from baseline 1.6-1.8, with minimal urine output in the past few days. Will plan for 500 mL bolus over 4 hours as patient dehydrated on exam. Will repeat BMP in AM. -500 mL over 4 hours -BMP in AM  Chronic HFpEF (grade 3 diastolic dysfunction 05/10/2017 echo) with pulmonary HTN: Patient's echocardiography on 05/10/2017 showed worsened diastolic dysfunction from that previously measured in 01/2017. Patient no longer responding  to IV diuresis, most likely because of dehydration in the setting of poor PO intake for multiple days. -Continue strict I&Os  -Hold Lasix today  Suprapubic pain: Patient had tenderness with palpation of lower abdominal quadrants, mainly suprapubic. This is consistent with UTI  and will follow up studies obtained from catheterization last night.  -Follow up urinalysis obtained from I&O cath to ensure no signs of infection.  Paroxysmal AF:On metoprolol 50 mg BID for rate controll and Eliquis 2.5 mg BID for anticoagulation.  -Hold home eliquis today 2.5 BID today 2/2 renal failure  Fluids: 500 ml NS over 4 hours Diet: HH VTE prophylaxis: SCD's while in bed Code Status: DNR  Dispo: Anticipated discharge pending clinical improvement.   Rozann LeschesNedrud, Meena Barrantes, MD 05/19/2017, 7:43 AM Pager: [phone]

## 2017-05-19 NOTE — Progress Notes (Signed)
Internal Medicine Attending:   I saw and examined the patient. I reviewed the resident's note and I agree with the resident's findings and plan as documented in the resident's note.  83 year old woman with progressive acute on chronic hypoxic respiratory failure currently requiring noninvasive mechanical ventilation 10/5 and 60% FiO2. Chest tube was placed in the right large pleural effusion yesterday with over 2 L of output overnight. X-ray this morning shows fully expanded right lung. However, despite this intervention her respiratory status continues to decline. She is also now in oliguric acute renal failure with low UOP and rising creatinine. This morning she was somnolent, awakens to voice but falls back to sleep. We brought her off the BiPAP for a few minutes, but she became agitated, sitting straight up, increased work of breathing, so he placed her back on the BiPAP. I told her that given her multiorgan dysfunction which is not responding to our interventions I think we don't have anything further to offer besides a palliative approach. The patient acknowledged this, she became tearful, but her increased work of breathing precluded her from speaking. We have called her family to discuss today as I think she now needs a surrogate decision maker. DNI status was confirmed on admission and consistent throughout her hospital stay, so we do not have much further respiratory support to offer beyond what she is receiving right now.

## 2017-05-19 NOTE — Progress Notes (Signed)
NIGHT TEAM INTERVAL PROGRESS NOTE  Paged by RN at approximately 2 am with pt c/o worsened abdominal pain radiating to back after PO tramadol given earlier for chest tube site pain. Also with NB emesis x2. Zofran and Phenergan given. Went to bedside to evaluate patient. Patient complaining of lower abdominal pain that's preventing her from sleep. Denies any radiation. BP initially 82/62, rose to 100/55 without intervention. HR stable in the 60s. Chronically-ill and frail appearing woman resting in bed. Patient breathing comfortably on NRB with O2 sats 99%. Patient appeared stable but uncomfortable. NR & RR, anterior lung fields without wheezes, TTP in lower abdomen. No rigidity.  Pt had in/out cath earlier this evening with 330cc removed that, per RN, was foul-smelling. Urine sample unfortunately not saved. R pigtail catheter placed earlier today. Per RN, chest tube draining well. As patient had thora today, concern for retroperitoneal bleed however bladder spasm vs UTI vs renal stone vs obstruction or even constipation more likely given her stability. Limited on other pain medication options 2/2 AKI. Discussed with patient medication limitations given her worsening renal function. She agrees to hold off on pain medications for now. If bladder spasm, treatment limited given her urinary retention.   Plan - On Ceftaroline - CXR, KUB - UA - simethicone, K-pad

## 2017-05-19 NOTE — Progress Notes (Signed)
Placed pt back on BIPAP. RT will continue to monitor.

## 2017-05-19 NOTE — Progress Notes (Signed)
Pt has had no additional episodes of vomiting since PRN phenergan given at 0138.  Pt resting in bed with eyes opening to verbal command but then closing with RR noted to be even and unlabored.  Pt denies nausea at this time.  RT notified to evaluate and place patient back on bipap.

## 2017-05-20 LAB — CULTURE, BODY FLUID-BOTTLE

## 2017-05-20 LAB — CULTURE, BODY FLUID W GRAM STAIN -BOTTLE: Culture: NO GROWTH

## 2017-05-20 MED ORDER — HYDROMORPHONE HCL 1 MG/ML IJ SOLN
0.5000 mg | INTRAMUSCULAR | Status: DC | PRN
  Administered 2017-05-20: 0.5 mg via INTRAVENOUS
  Filled 2017-05-20: qty 0.5

## 2017-05-20 MED ORDER — HYDROMORPHONE HCL 1 MG/ML IJ SOLN
0.5000 mg | INTRAMUSCULAR | Status: DC
Start: 2017-05-20 — End: 2017-05-20
  Filled 2017-05-20: qty 0.5

## 2017-05-20 MED ORDER — HYDROMORPHONE HCL 1 MG/ML IJ SOLN
0.5000 mg | INTRAMUSCULAR | Status: DC | PRN
  Administered 2017-05-20: 0.5 mg via INTRAVENOUS

## 2017-05-22 IMAGING — US US THORACENTESIS ASP PLEURAL SPACE W/IMG GUIDE
1 series · 8 of 8 positions shown · non-contrast
Comparison: none

INDICATION: Patient with history of coronary artery disease/ CHF, chronic kidney
disease, dyspnea, recurrent right pleural effusion. Request made for
therapeutic right thoracentesis.

[Series 1: us thoracentesis asp pleural space w/img guide · 0.26mm/px · 8 of 8 slices shown]
[im 1/8]
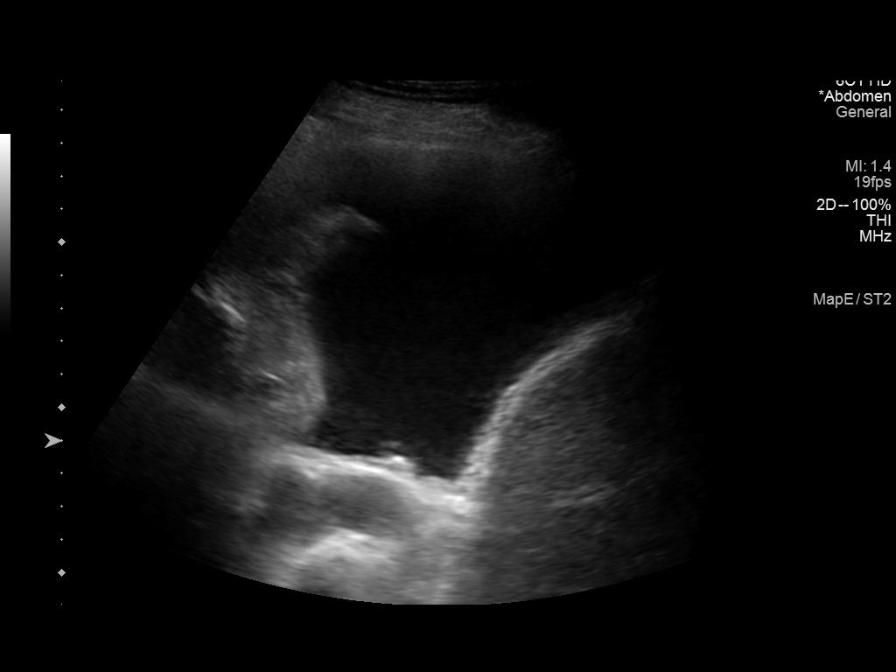
[im 2/8]
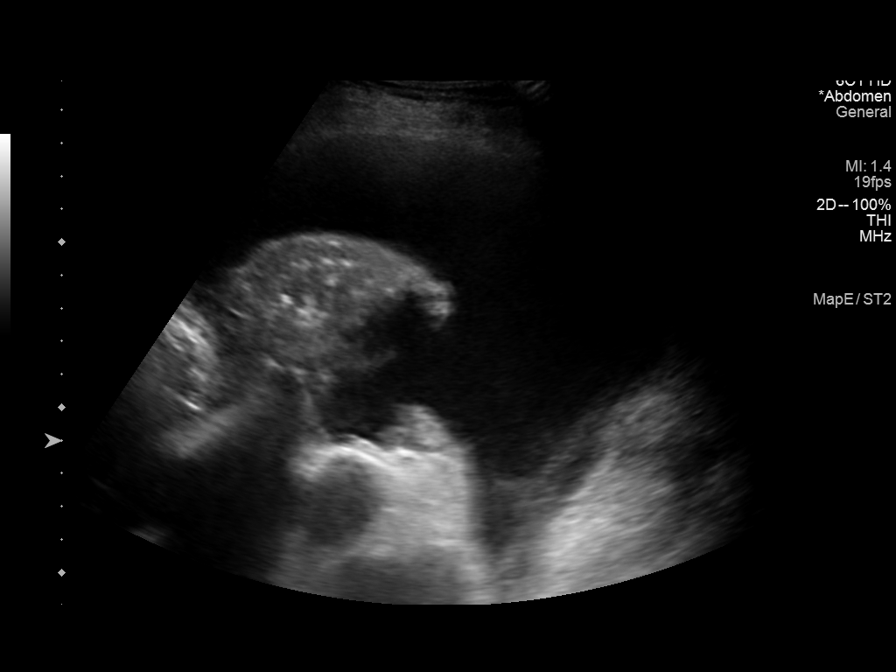
[im 3/8]
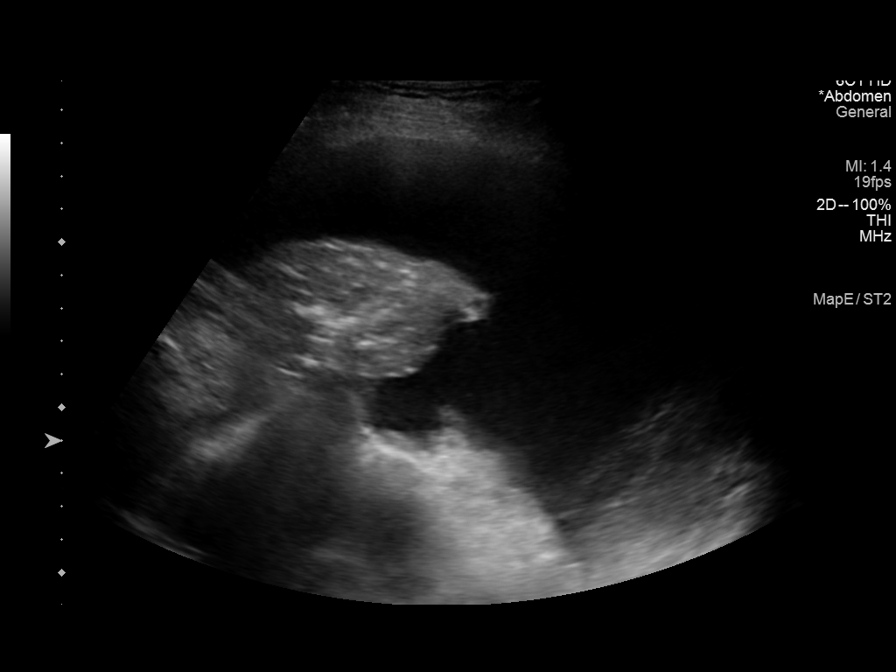
[im 4/8]
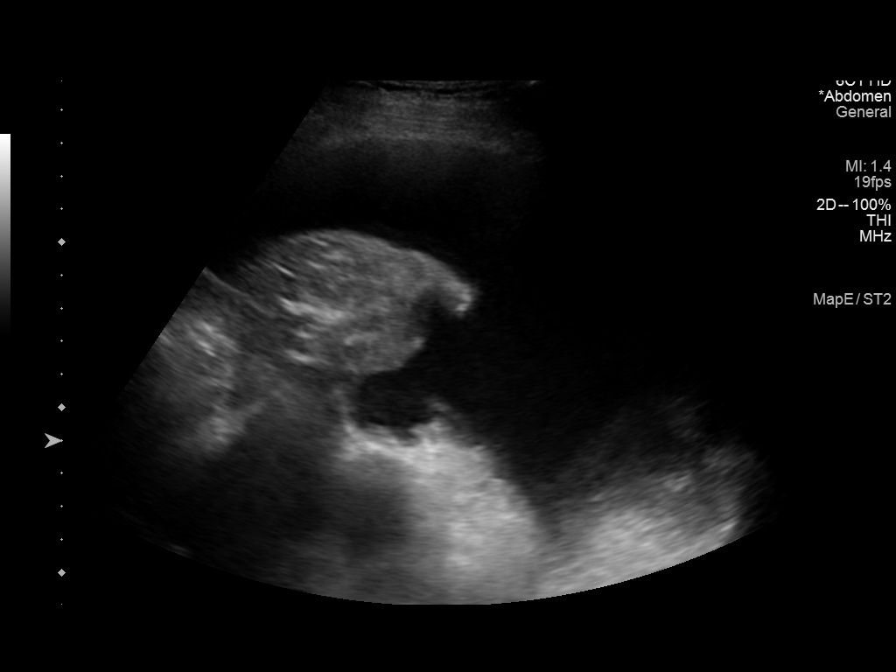
[im 5/8]
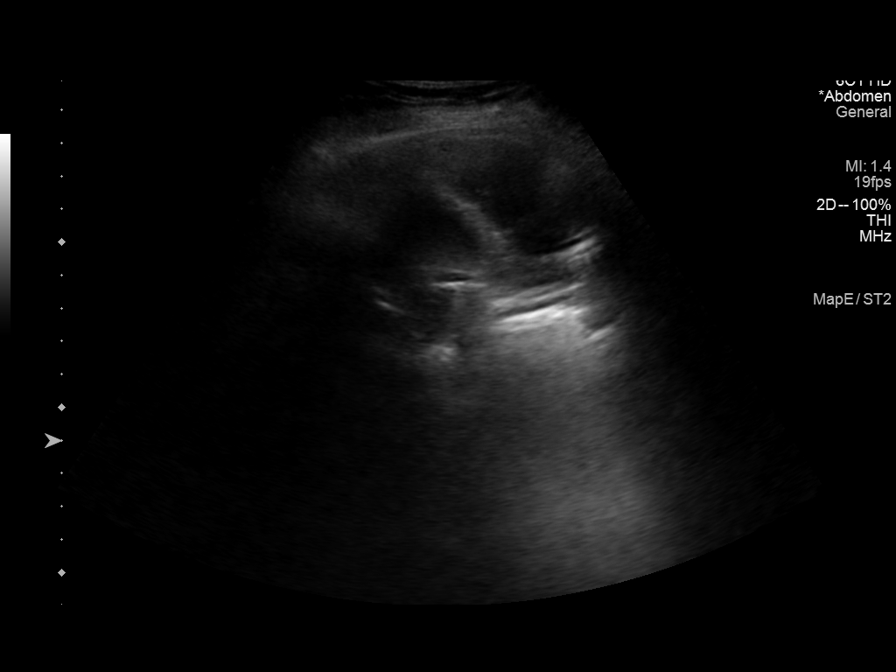
[im 6/8]
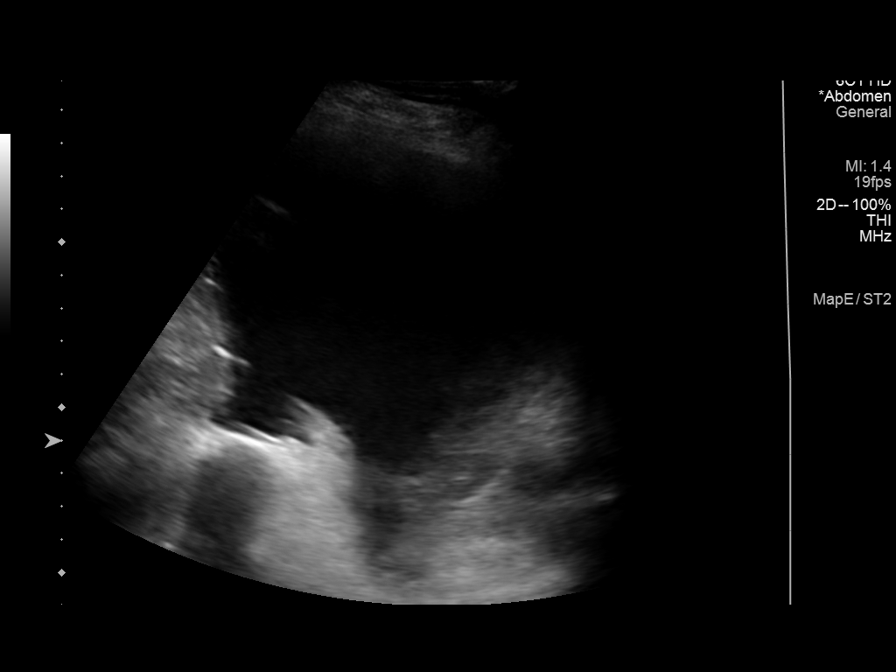
[im 7/8]
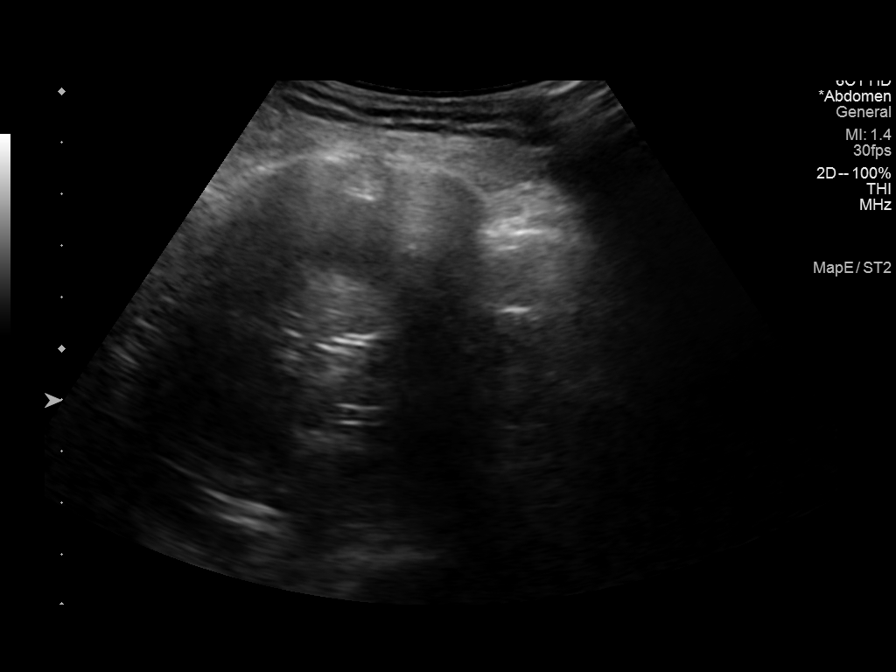
[im 8/8]
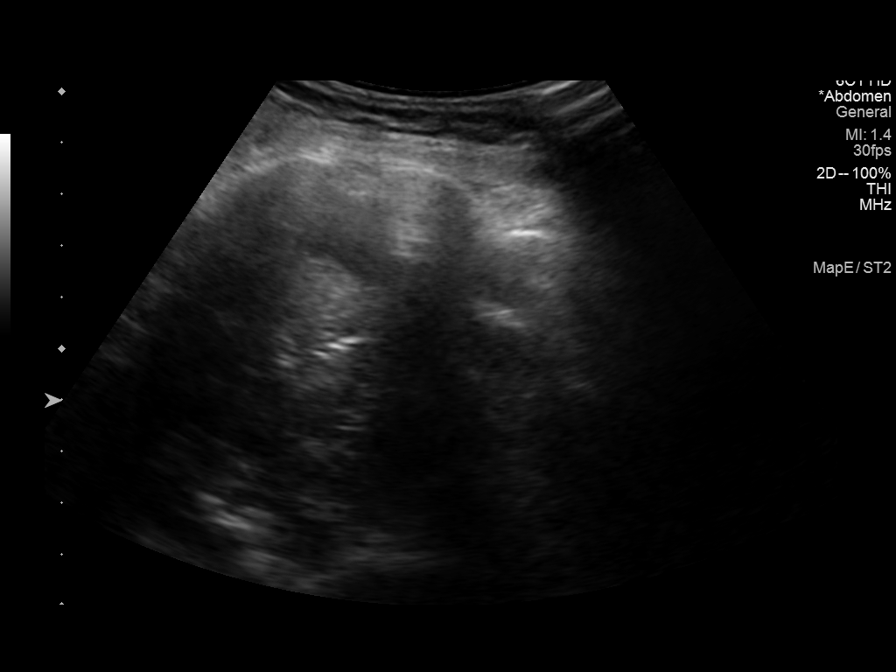

[8 of 8 positions shown; findings below may reference images not displayed]

EXAM:
ULTRASOUND GUIDED THERAPEUTIC RIGHT THORACENTESIS

MEDICATIONS:
None.

COMPLICATIONS:
None immediate.

PROCEDURE:
An ultrasound guided thoracentesis was thoroughly discussed with the
patient and questions answered. The benefits, risks, alternatives
and complications were also discussed. The patient understands and
wishes to proceed with the procedure. Written consent was obtained.

Ultrasound was performed to localize and mark an adequate pocket of
fluid in the right chest. The area was then prepped and draped in
the normal sterile fashion. 1% Lidocaine was used for local
anesthesia. Under ultrasound guidance a Yueh catheter was
introduced. Thoracentesis was performed. The catheter was removed
and a dressing applied.
FINDINGS: A total of approximately 950 cc of yellow fluid was removed.
IMPRESSION: Successful ultrasound guided therapeutic right thoracentesis
yielding 950 cc of pleural fluid.

## 2017-05-25 ENCOUNTER — Encounter: Payer: Medicare Other | Admitting: Cardiothoracic Surgery

## 2017-05-27 LAB — FUNGUS CULTURE WITH STAIN

## 2017-05-27 LAB — FUNGUS CULTURE RESULT

## 2017-05-27 LAB — FUNGAL ORGANISM REFLEX

## 2017-06-02 NOTE — Progress Notes (Signed)
Pt removed from bipap, O2 sats dipping into upper 70s-lower 80s within 2 mins, family in room no comfortable with sats reading that low and bipap mask was placed back on. MD made aware that patient cannot tolerate being off bipap at this time, will continue to monitor.

## 2017-06-02 NOTE — Progress Notes (Signed)
Family requested water for the patient to drink, reeducated on NPO status and how to do oral care as an alternative with oral care sponges that were provided.

## 2017-06-02 NOTE — Progress Notes (Signed)
PCCM Attending Note:  Patient's EMR notes reviewed. Patient is planned to transition to full comfort once additional family arrives. Can likely discontinue chest tube at the time for patient's comfort. I will not see the patient today unless requested. Please let me know if you have any new questions or I can be of any further assistance in the care of this patient.   Patient was not seen today. No charge.  Donna ChristenJennings E. Jamison NeighborNestor, [name] [facility] Pager:  [phone] After 7pm or if no response, call [phone] 9:56 AM 05/09/2017

## 2017-06-02 NOTE — Progress Notes (Signed)
Internal Medicine Attending:   I saw and examined the patient. I reviewed the resident's note and I agree with the resident's findings and plan as documented in the resident's note.  83 year old woman currently at end-of-life because of acute hypoxic respiratory failure due to heart failure complicated by acute oligarchic renal failure, for which there is nothing further that we have to offer. This morning she is sleeping, does not arouse, on noninvasive positive pressure ventilation but overall appears comfortable. Updated family at the bedside, it appears she responded well to Dilaudid and Ativan last night. I don't think she needs the BiPAP machine any further, but family may feel strongly that it helps her work of breathing. The family has asked us not to involve the palliative care service, and that Ms. Hoxworth would not want to go to an inpatient hospice center. I anticipate she will have an inhospital death in the coming days. Would continue with as needed dilaudid, and escalate to infusion if needed.

## 2017-06-02 NOTE — Progress Notes (Signed)
Discussed with pts family next steps in comfort care. Will continue to keep monitors on in room per family request.

## 2017-06-02 NOTE — Progress Notes (Addendum)
Family expresses concern over patients respiratory status, O2 is fluctuating from 87-92%. Requested that she get Ativan to help with the restlessness and breathing.

## 2017-06-02 NOTE — Progress Notes (Signed)
Patient has not voided during shift, bladder scan yields 125 ml.  No order for intervention at this time.

## 2017-06-02 NOTE — Progress Notes (Signed)
   Subjective:  Patient was seen laying in bed with BiPAP mask in place. Patient did not open eyes to voice, but responded to light touch. Unable to speak or participate with exam.  Objective:  Vital signs in last 24 hours: Vitals:   05/30/2017 0444 06/01/2017 0739 05/28/2017 0802 05/27/2017 1017  BP: (!) 96/47     Pulse: 79 85  95  Resp: 14 18  (!) 27  Temp: 97.6 F (36.4 C)  98.2 F (36.8 C)   TempSrc: Axillary  Axillary   SpO2: 91% 90%  90%  Weight:      Height:       Physical Exam  Constitutional: She appears well-developed and well-nourished. No distress.  Cardiovascular: Normal rate, regular rhythm and intact distal pulses.  Exam reveals no friction rub.   No murmur heard. Pulmonary/Chest: Effort normal. No respiratory distress. She has no wheezes. She has no rales.  Abdominal: Soft. She exhibits no distension. There is tenderness (Grimace with palpation of lower quadrants). There is no guarding.  Musculoskeletal: She exhibits no edema (of bilateral lower extremities) or tenderness (of bilateral lower extremities).  Skin: Skin is warm and dry. No rash noted. No erythema.   Assessment/Plan:  Principal Problem:   Acute hypoxemic respiratory failure (HCC) Active Problems:   History of renal transplant   Pleural effusion, bilateral, recurrent   CAP (community acquired pneumonia)   Fatigue   Shortness of breath   Cough with sputum   Protein-calorie malnutrition (HCC)   Personal history of immunosupression therapy   Anorexia   Staphylococcus epidermidis infection   CKD (chronic kidney disease), symptom management only, stage 4 (severe) (HCC)  [name] is an 83 yo with PMH of T2DM, CKD s/p kidney transplant in 2003 on immunosuppression, diastolic CHF, and CAD who presented on 04/05/2017 with increased productive cough, worsening SOB, and progressively worsening malaise. The patient was admitted to the internal medicine teaching service for management. The specific problems  addressed during this admission are as follows:  Acute hypoxic respiratory failure 2/2 chronic bilateralpleural fluid effusions:Patient's respiratory status continues to decline, as she is desaturating to high 80's with BiPAP. Patient's family transitioning to full comfort care and patient will be transferred to floor bed later today pending family discussion. -Continue use of BiPAP while in stepdown, will speak to Daughter Lyvonne today at 12:30pm regarding discontinuation of BiPAP on transfer out of stepdown unit to floor. 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 Stopped immune modulating therapy yesterday, as patient transitioning to full comfort care  Chronic HFpEF (grade 3 diastolic dysfunction 05/10/2017 echo) with pulmonary HTN: Patient given fluids overnight for comfort and holding home lasix dose, as patient appears dry on exam.   Paroxysmal YQ:MVHQIONAF:Holding home medications, including metoprolol 50 mg BID and Eliquis 2.5 mg BID, as patient transitioned to full comfort care.  Fluids: None Diet: Regular VTE prophylaxis: SCD's while in bed Code Status: DNR, comfort care  Dispo: No disposition planned, as inpatient death most likely.   Rozann LeschesNedrud, Guneet Delpino, MD 05/07/2017, 10:31 AM Pager: [phone]

## 2017-06-02 NOTE — Progress Notes (Addendum)
Patient put on bipap, sats have continued to fluctuate 87-92% on partial non rebreather. Family has expressed concern and request she be placed back on bipap. Pt does not appear to be under any distress, resting with eyes closed, pillows repositioned for comfort. Will continue to monitor closely.

## 2017-06-02 NOTE — Progress Notes (Signed)
Pt.s heart rate in 30s. MD present when pt went asystole in room. Family present at time of death. postmoterm checklist completed. Funeral to be contacted. Family wishes to stay at bedside for now.

## 2017-06-02 DEATH — deceased

## 2017-06-13 LAB — ACID FAST CULTURE WITH REFLEXED SENSITIVITIES: ACID FAST CULTURE - AFSCU3: NEGATIVE

## 2017-06-19 LAB — ACID FAST CULTURE WITH REFLEXED SENSITIVITIES (MYCOBACTERIA): Acid Fast Culture: NEGATIVE

## 2017-06-28 LAB — ACID FAST CULTURE WITH REFLEXED SENSITIVITIES (MYCOBACTERIA): Acid Fast Culture: NEGATIVE

## 2018-05-18 IMAGING — US IR THORACENTESIS ASP PLEURAL SPACE W/IMG GUIDE
1 series · 2 of 2 positions shown · non-contrast
Comparison: none

INDICATION: Shortness of breath. Left-sided pleural effusion. Request
therapeutic thoracentesis.

[Series 1: ir (id) (id)/(id)/(id) ir · 2 of 2 slices shown]
[im 1/2]
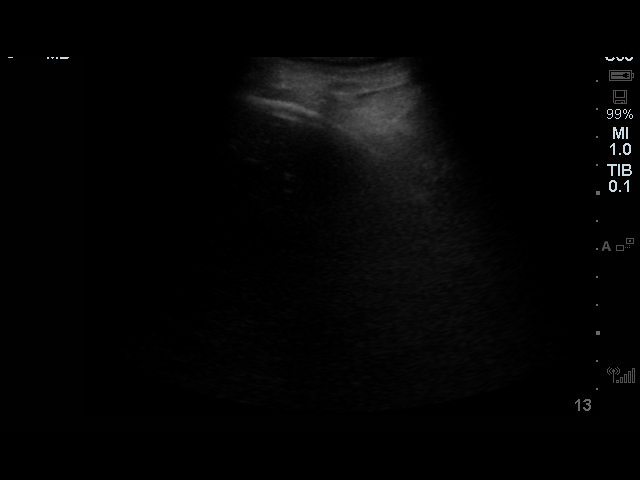
[im 2/2]
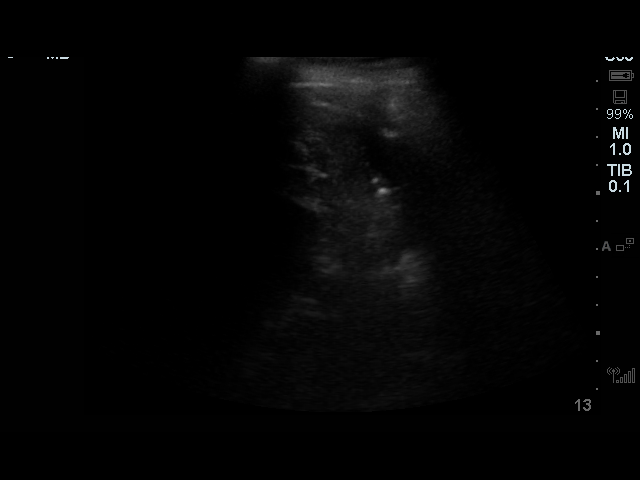

[2 of 2 positions shown; findings below may reference images not displayed]

EXAM:
ULTRASOUND GUIDED LEFT THORACENTESIS

MEDICATIONS:
None.

COMPLICATIONS:
None immediate.

PROCEDURE:
An ultrasound guided thoracentesis was thoroughly discussed with the
patient and questions answered. The benefits, risks, alternatives
and complications were also discussed. The patient understands and
wishes to proceed with the procedure. Written consent was obtained.

Ultrasound was performed to localize and mark an adequate pocket of
fluid in the left chest. The area was then prepped and draped in the
normal sterile fashion. 1% Lidocaine was used for local anesthesia.
Under ultrasound guidance a catheter was introduced. Thoracentesis
was performed. The catheter was removed and a dressing applied.
FINDINGS: A total of approximately 600 mL of clear yellow fluid was removed.
IMPRESSION: Successful ultrasound guided left thoracentesis yielding 600 mL of
pleural fluid.

## 2018-05-19 IMAGING — DX DG CHEST 1V PORT
1 series · 1 of 1 positions shown · non-contrast
Comparison: 05/16/2017, 05/15/2017

CLINICAL DATA: Bilateral pleural effusion

EXAM:
PORTABLE CHEST 1 VIEW

[chest ap]
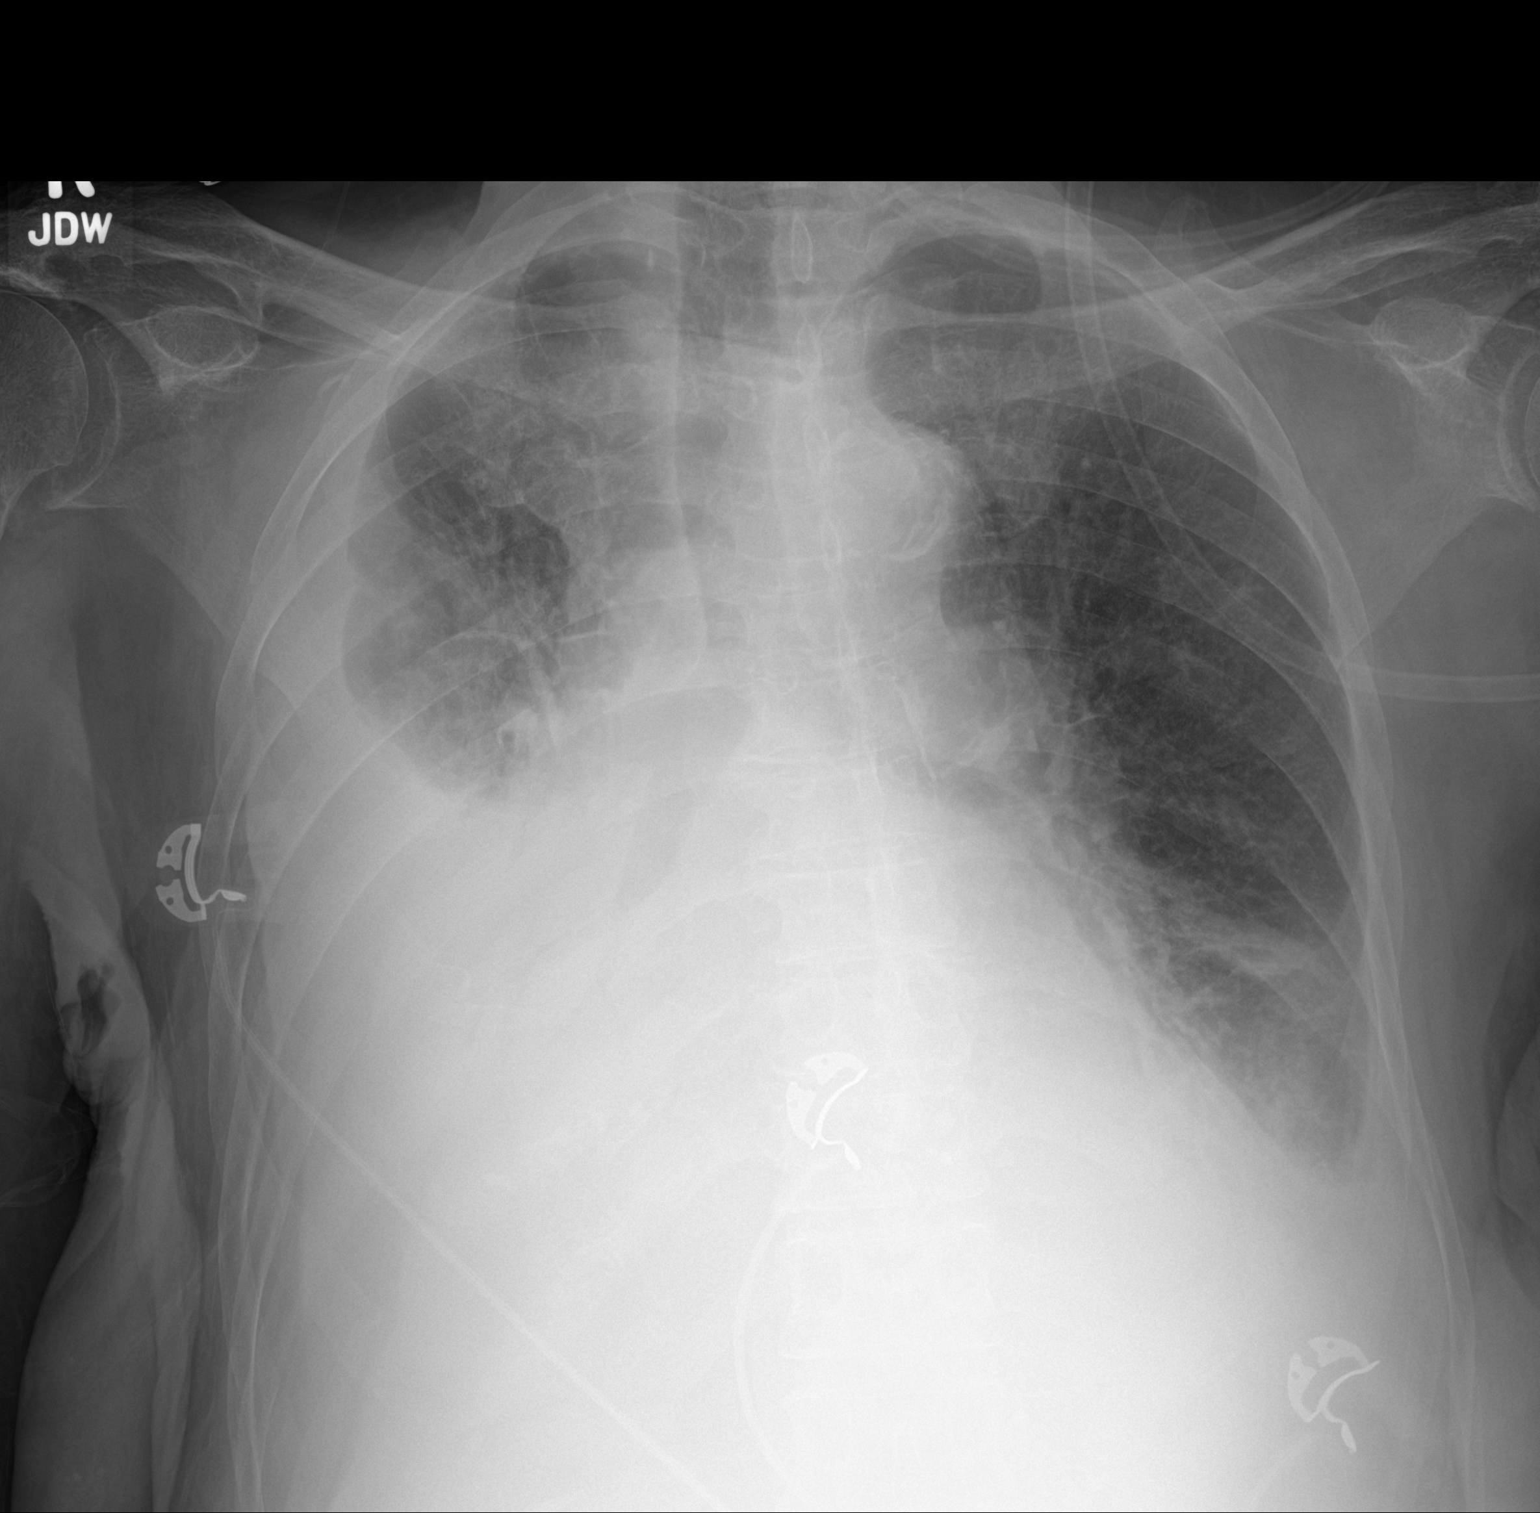

[1 of 1 positions shown; findings below may reference images not displayed]

FINDINGS: Moderate to large right pleural effusion, increased compared to
prior radiograph. Small moderate left pleural effusion, slight
increased compared to prior radiograph. Bilateral lower lung
consolidations. Enlarged cardiomediastinal silhouette with aortic
atherosclerosis. No pneumothorax.
IMPRESSION: 1. Moderate to large right pleural effusion, increased compared to
prior radiograph. Small moderate left pleural effusion, also
increased compared to prior radiograph
2. Cardiomegaly with central vascular congestion and probable mild
diffuse interstitial edema.
3. Bilateral lower lung atelectasis or pneumonia.

## 2018-05-21 IMAGING — DX DG CHEST 1V PORT
1 series · 1 of 1 positions shown · non-contrast
Comparison: 05/18/2017 chest radiograph. 05/18/2017 CT abdomen and
pelvis.

CLINICAL DATA: 85 y/o  F; thoracentesis with abdominal pain.

EXAM:
PORTABLE CHEST 1 VIEW
PORTABLE ABDOMEN 1 VIEW

[chest ap]
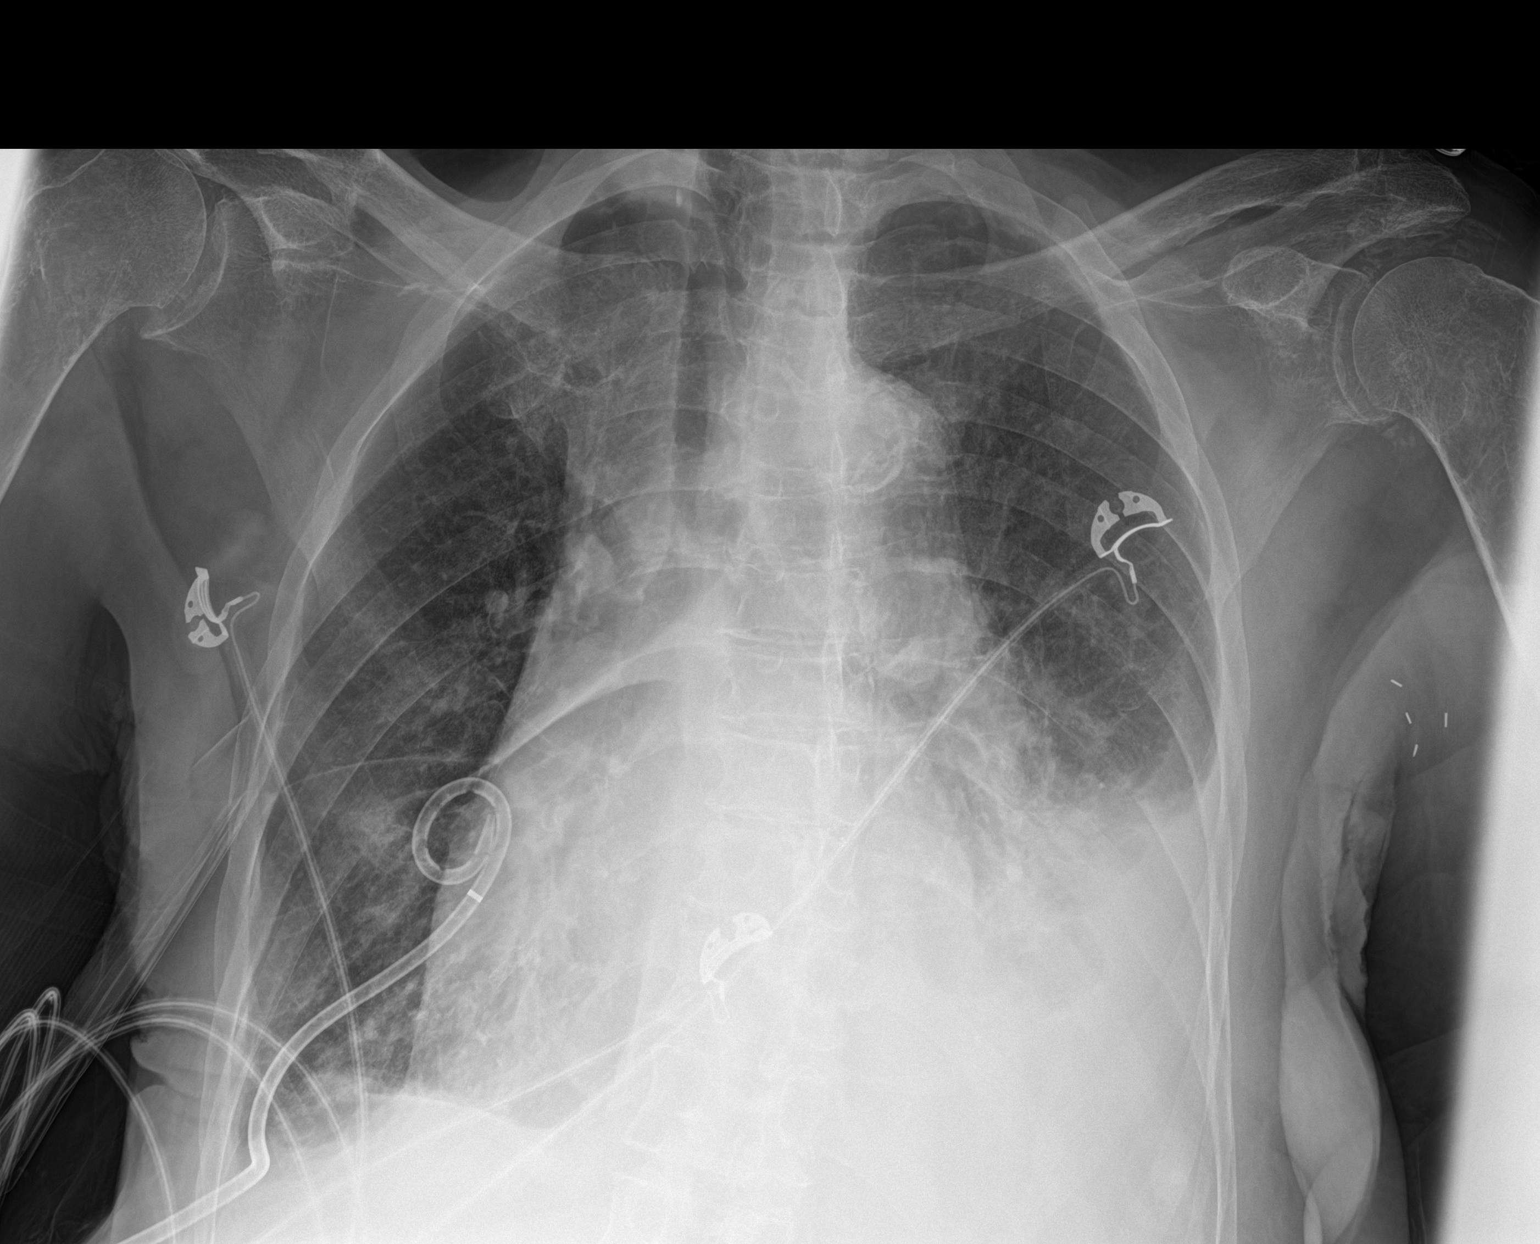

[1 of 1 positions shown; findings below may reference images not displayed]

FINDINGS: Chest:

Stable cardiomegaly. Right pleural drain placement with diminution
in right pleural effusion, small residual. Increased moderate left
pleural effusion. Left basilar opacity probably represents
associated atelectasis. Aortic atherosclerosis with calcification.
No acute osseous abnormality identified.

Abdomen:

Nonobstructive bowel gas pattern. No radiopaque urinary stone
disease identified. Bones are unremarkable. Aortic atherosclerosis
with calcification.
IMPRESSION: Right pleural drain placement with decreased right-sided effusion,
small residual.

Increase moderate left effusion. Left basilar opacity probably
represents associated atelectasis. Pneumonia is not excluded.

Stable cardiomegaly and aortic atherosclerosis.

Nonobstructive bowel gas pattern.

By: Eita Tri [name]

## 2018-05-21 IMAGING — DX DG ABD PORTABLE 1V
1 series · 1 of 1 positions shown · non-contrast
Comparison: 05/18/2017 chest radiograph. 05/18/2017 CT abdomen and
pelvis.

CLINICAL DATA: 85 y/o  F; thoracentesis with abdominal pain.

EXAM:
PORTABLE CHEST 1 VIEW
PORTABLE ABDOMEN 1 VIEW

[abdomen kub]
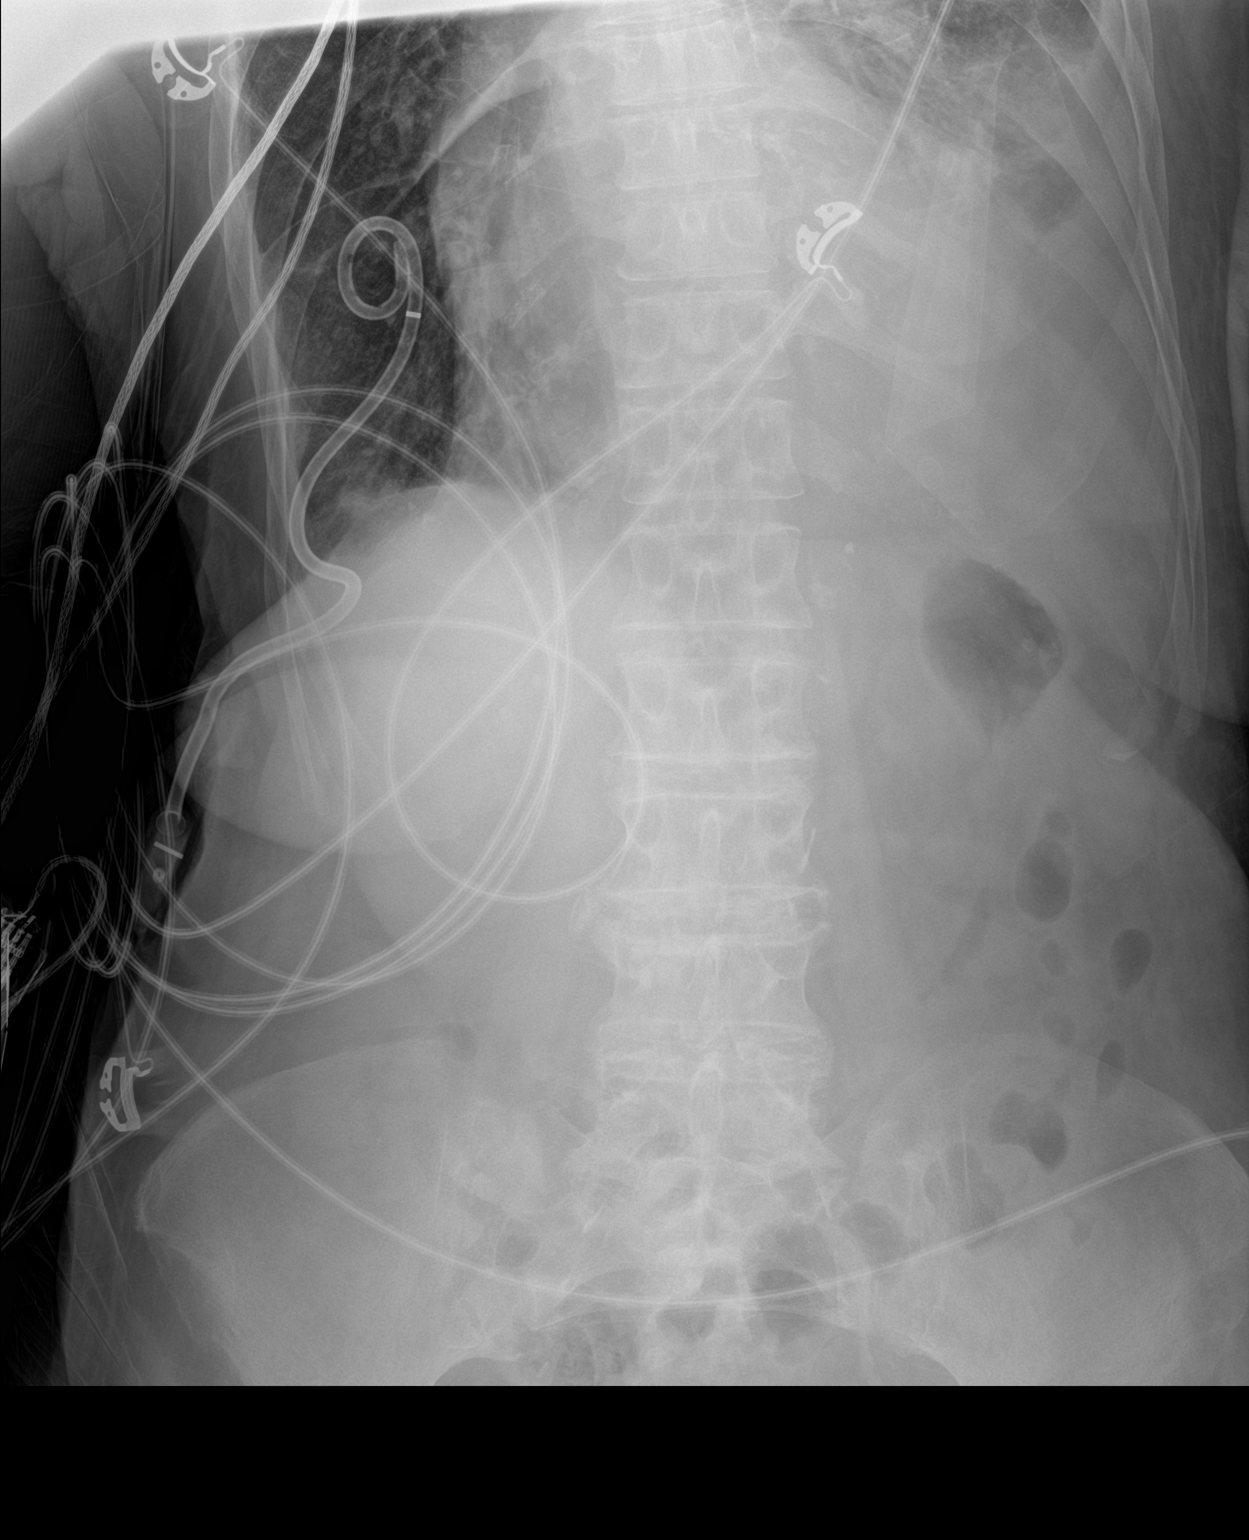

[1 of 1 positions shown; findings below may reference images not displayed]

FINDINGS: Chest:

Stable cardiomegaly. Right pleural drain placement with diminution
in right pleural effusion, small residual. Increased moderate left
pleural effusion. Left basilar opacity probably represents
associated atelectasis. Aortic atherosclerosis with calcification.
No acute osseous abnormality identified.

Abdomen:

Nonobstructive bowel gas pattern. No radiopaque urinary stone
disease identified. Bones are unremarkable. Aortic atherosclerosis
with calcification.
IMPRESSION: Right pleural drain placement with decreased right-sided effusion,
small residual.

Increase moderate left effusion. Left basilar opacity probably
represents associated atelectasis. Pneumonia is not excluded.

Stable cardiomegaly and aortic atherosclerosis.

Nonobstructive bowel gas pattern.

By: Eita Tri [name]

## 2018-12-14 NOTE — Telephone Encounter (Signed)
done
